# Patient Record
Sex: Male | Born: 1970 | Race: Black or African American | Hispanic: No | Marital: Single | Smoking: Current every day smoker
Health system: Southern US, Community
[De-identification: ages and names within clinical notes are randomized; demographics above are authoritative.]

## PROBLEM LIST (undated history)

## (undated) DIAGNOSIS — N319 Neuromuscular dysfunction of bladder, unspecified: Secondary | ICD-10-CM

## (undated) DIAGNOSIS — I429 Cardiomyopathy, unspecified: Secondary | ICD-10-CM

## (undated) DIAGNOSIS — I639 Cerebral infarction, unspecified: Secondary | ICD-10-CM

## (undated) DIAGNOSIS — E875 Hyperkalemia: Secondary | ICD-10-CM

## (undated) DIAGNOSIS — I5020 Unspecified systolic (congestive) heart failure: Secondary | ICD-10-CM

## (undated) DIAGNOSIS — Z720 Tobacco use: Secondary | ICD-10-CM

## (undated) DIAGNOSIS — D696 Thrombocytopenia, unspecified: Secondary | ICD-10-CM

## (undated) DIAGNOSIS — I10 Essential (primary) hypertension: Secondary | ICD-10-CM

## (undated) DIAGNOSIS — F1910 Other psychoactive substance abuse, uncomplicated: Secondary | ICD-10-CM

## (undated) DIAGNOSIS — Z992 Dependence on renal dialysis: Secondary | ICD-10-CM

## (undated) DIAGNOSIS — IMO0002 Reserved for concepts with insufficient information to code with codable children: Secondary | ICD-10-CM

## (undated) DIAGNOSIS — I272 Pulmonary hypertension, unspecified: Secondary | ICD-10-CM

## (undated) DIAGNOSIS — Z87442 Personal history of urinary calculi: Secondary | ICD-10-CM

## (undated) DIAGNOSIS — R0600 Dyspnea, unspecified: Secondary | ICD-10-CM

## (undated) DIAGNOSIS — Z993 Dependence on wheelchair: Secondary | ICD-10-CM

## (undated) DIAGNOSIS — D631 Anemia in chronic kidney disease: Secondary | ICD-10-CM

## (undated) DIAGNOSIS — N186 End stage renal disease: Secondary | ICD-10-CM

## (undated) DIAGNOSIS — Z905 Acquired absence of kidney: Secondary | ICD-10-CM

## (undated) DIAGNOSIS — E46 Unspecified protein-calorie malnutrition: Secondary | ICD-10-CM

## (undated) DIAGNOSIS — I729 Aneurysm of unspecified site: Secondary | ICD-10-CM

## (undated) DIAGNOSIS — Z9189 Other specified personal risk factors, not elsewhere classified: Secondary | ICD-10-CM

## (undated) DIAGNOSIS — R159 Full incontinence of feces: Secondary | ICD-10-CM

## (undated) DIAGNOSIS — N289 Disorder of kidney and ureter, unspecified: Secondary | ICD-10-CM

## (undated) DIAGNOSIS — D649 Anemia, unspecified: Secondary | ICD-10-CM

## (undated) DIAGNOSIS — M21379 Foot drop, unspecified foot: Secondary | ICD-10-CM

## (undated) DIAGNOSIS — N189 Chronic kidney disease, unspecified: Secondary | ICD-10-CM

## (undated) HISTORY — PX: NEPHRECTOMY: SHX65

## (undated) HISTORY — PX: CRANIOTOMY: SHX93

## (undated) HISTORY — PX: NEPHROSTOMY: SHX1014

---

## 1989-05-22 DIAGNOSIS — I671 Cerebral aneurysm, nonruptured: Secondary | ICD-10-CM

## 1989-05-22 HISTORY — DX: Cerebral aneurysm, nonruptured: I67.1

## 2011-11-05 ENCOUNTER — Emergency Department: Payer: Self-pay | Admitting: Emergency Medicine

## 2011-11-05 LAB — COMPREHENSIVE METABOLIC PANEL
Albumin: 2.6 g/dL — ABNORMAL LOW (ref 3.4–5.0)
Anion Gap: 12 (ref 7–16)
BUN: 69 mg/dL — ABNORMAL HIGH (ref 7–18)
Bilirubin,Total: 0.5 mg/dL (ref 0.2–1.0)
Calcium, Total: 8.7 mg/dL (ref 8.5–10.1)
Chloride: 107 mmol/L (ref 98–107)
Co2: 19 mmol/L — ABNORMAL LOW (ref 21–32)
Creatinine: 7.79 mg/dL — ABNORMAL HIGH (ref 0.60–1.30)
EGFR (African American): 9 — ABNORMAL LOW
EGFR (Non-African Amer.): 8 — ABNORMAL LOW
Potassium: 5.2 mmol/L — ABNORMAL HIGH (ref 3.5–5.1)
SGOT(AST): 17 U/L (ref 15–37)
SGPT (ALT): 13 U/L
Sodium: 138 mmol/L (ref 136–145)
Total Protein: 7.9 g/dL (ref 6.4–8.2)

## 2011-11-05 LAB — CBC
HCT: 37.1 % — ABNORMAL LOW (ref 40.0–52.0)
MCH: 30.4 pg (ref 26.0–34.0)
MCHC: 32.3 g/dL (ref 32.0–36.0)
MCV: 94 fL (ref 80–100)
RBC: 3.94 10*6/uL — ABNORMAL LOW (ref 4.40–5.90)
WBC: 14.7 10*3/uL — ABNORMAL HIGH (ref 3.8–10.6)

## 2011-11-05 LAB — URINALYSIS, COMPLETE
Blood: NEGATIVE
RBC,UR: 3 /HPF (ref 0–5)
WBC UR: 23 /HPF (ref 0–5)

## 2011-11-05 LAB — ETHANOL: Ethanol %: <0.003 % (ref 0.000–0.080)

## 2011-11-09 LAB — WOUND CULTURE

## 2011-11-10 LAB — CULTURE, BLOOD (SINGLE)

## 2011-11-13 LAB — URINE CULTURE

## 2012-04-10 ENCOUNTER — Emergency Department: Payer: Self-pay | Admitting: Emergency Medicine

## 2012-04-11 LAB — CBC WITH DIFFERENTIAL/PLATELET
Basophil #: 0.1 10*3/uL (ref 0.0–0.1)
Eosinophil #: 0.1 10*3/uL (ref 0.0–0.7)
Eosinophil %: 1.3 %
HCT: 40.1 % (ref 40.0–52.0)
HGB: 13.4 g/dL (ref 13.0–18.0)
Lymphocyte #: 1.7 10*3/uL (ref 1.0–3.6)
MCHC: 33.5 g/dL (ref 32.0–36.0)
MCV: 94 fL (ref 80–100)
Monocyte #: 0.7 x10 3/mm (ref 0.2–1.0)
Monocyte %: 7.2 %
Platelet: 189 10*3/uL (ref 150–440)

## 2012-04-11 LAB — COMPREHENSIVE METABOLIC PANEL
Albumin: 3.5 g/dL (ref 3.4–5.0)
Alkaline Phosphatase: 81 U/L (ref 50–136)
Anion Gap: 8 (ref 7–16)
BUN: 40 mg/dL — ABNORMAL HIGH (ref 7–18)
Bilirubin,Total: 0.6 mg/dL (ref 0.2–1.0)
Calcium, Total: 9 mg/dL (ref 8.5–10.1)
Chloride: 110 mmol/L — ABNORMAL HIGH (ref 98–107)
Creatinine: 3.79 mg/dL — ABNORMAL HIGH (ref 0.60–1.30)
Osmolality: 286 (ref 275–301)
Potassium: 5.1 mmol/L (ref 3.5–5.1)
SGOT(AST): 21 U/L (ref 15–37)
Sodium: 139 mmol/L (ref 136–145)
Total Protein: 9.2 g/dL — ABNORMAL HIGH (ref 6.4–8.2)

## 2012-04-11 LAB — URINALYSIS, COMPLETE
Glucose,UR: NEGATIVE mg/dL (ref 0–75)
Nitrite: POSITIVE
Ph: 8 (ref 4.5–8.0)
Protein: >=500
RBC,UR: 445 /HPF (ref 0–5)

## 2012-04-16 LAB — CULTURE, BLOOD (SINGLE)

## 2012-05-16 ENCOUNTER — Emergency Department: Payer: Self-pay | Admitting: Emergency Medicine

## 2012-07-17 ENCOUNTER — Emergency Department: Payer: Self-pay | Admitting: Unknown Physician Specialty

## 2012-07-17 LAB — URINALYSIS, COMPLETE
Glucose,UR: NEGATIVE mg/dL (ref 0–75)
Nitrite: NEGATIVE
Ph: 7 (ref 4.5–8.0)
Squamous Epithelial: NONE SEEN

## 2012-07-17 LAB — COMPREHENSIVE METABOLIC PANEL
Albumin: 3.1 g/dL — ABNORMAL LOW (ref 3.4–5.0)
Alkaline Phosphatase: 81 U/L (ref 50–136)
Anion Gap: 13 (ref 7–16)
BUN: 61 mg/dL — ABNORMAL HIGH (ref 7–18)
Bilirubin,Total: 0.7 mg/dL (ref 0.2–1.0)
Calcium, Total: 9 mg/dL (ref 8.5–10.1)
Chloride: 104 mmol/L (ref 98–107)
EGFR (African American): 11 — ABNORMAL LOW
EGFR (Non-African Amer.): 9 — ABNORMAL LOW
Osmolality: 292 (ref 275–301)
Potassium: 4.8 mmol/L (ref 3.5–5.1)
SGPT (ALT): 14 U/L (ref 12–78)
Sodium: 138 mmol/L (ref 136–145)

## 2012-07-17 LAB — CBC WITH DIFFERENTIAL/PLATELET
Basophil #: 0.1 10*3/uL (ref 0.0–0.1)
Basophil %: 0.4 %
Eosinophil #: 0 10*3/uL (ref 0.0–0.7)
Eosinophil %: 0.4 %
HCT: 39.4 % — ABNORMAL LOW (ref 40.0–52.0)
Lymphocyte %: 9.1 %
MCH: 30.7 pg (ref 26.0–34.0)
Monocyte #: 1.2 x10 3/mm — ABNORMAL HIGH (ref 0.2–1.0)
Neutrophil #: 10.4 10*3/uL — ABNORMAL HIGH (ref 1.4–6.5)
Platelet: 164 10*3/uL (ref 150–440)
RBC: 4.21 10*6/uL — ABNORMAL LOW (ref 4.40–5.90)
RDW: 13.8 % (ref 11.5–14.5)

## 2012-07-17 LAB — PROTIME-INR
INR: 1.2
Prothrombin Time: 15.5 secs — ABNORMAL HIGH (ref 11.5–14.7)

## 2012-07-17 LAB — TROPONIN I: Troponin-I: <0.02 ng/mL

## 2012-07-17 LAB — MAGNESIUM: Magnesium: 2.2 mg/dL

## 2012-07-20 LAB — URINE CULTURE

## 2012-07-22 LAB — CULTURE, BLOOD (SINGLE)

## 2012-09-04 ENCOUNTER — Emergency Department: Payer: Self-pay | Admitting: Emergency Medicine

## 2013-05-10 ENCOUNTER — Emergency Department: Payer: Self-pay | Admitting: Emergency Medicine

## 2013-07-05 ENCOUNTER — Emergency Department: Payer: Self-pay | Admitting: Emergency Medicine

## 2013-10-22 ENCOUNTER — Emergency Department: Payer: Self-pay | Admitting: Emergency Medicine

## 2013-10-22 LAB — CBC
HCT: 40.8 % (ref 40.0–52.0)
HGB: 13.5 g/dL (ref 13.0–18.0)
MCH: 31 pg (ref 26.0–34.0)
MCHC: 33.1 g/dL (ref 32.0–36.0)
MCV: 94 fL (ref 80–100)
Platelet: 261 10*3/uL (ref 150–440)
RBC: 4.35 10*6/uL — ABNORMAL LOW (ref 4.40–5.90)
RDW: 13.4 % (ref 11.5–14.5)
WBC: 6 10*3/uL (ref 3.8–10.6)

## 2013-10-22 LAB — URINALYSIS, COMPLETE
Bilirubin,UR: NEGATIVE
GLUCOSE, UR: NEGATIVE mg/dL (ref 0–75)
Ketone: NEGATIVE
Nitrite: NEGATIVE
Ph: 6 (ref 4.5–8.0)
Protein: 100
RBC,UR: 70 /HPF (ref 0–5)
SPECIFIC GRAVITY: 1.011 (ref 1.003–1.030)
Squamous Epithelial: 1
WBC UR: 1593 /HPF (ref 0–5)

## 2013-10-22 LAB — BASIC METABOLIC PANEL
Anion Gap: 6 — ABNORMAL LOW (ref 7–16)
BUN: 74 mg/dL — AB (ref 7–18)
CHLORIDE: 112 mmol/L — AB (ref 98–107)
CO2: 20 mmol/L — AB (ref 21–32)
Calcium, Total: 9.3 mg/dL (ref 8.5–10.1)
Creatinine: 5.22 mg/dL — ABNORMAL HIGH (ref 0.60–1.30)
EGFR (African American): 14 — ABNORMAL LOW
EGFR (Non-African Amer.): 12 — ABNORMAL LOW
Glucose: 68 mg/dL (ref 65–99)
OSMOLALITY: 296 (ref 275–301)
Potassium: 4.7 mmol/L (ref 3.5–5.1)
SODIUM: 138 mmol/L (ref 136–145)

## 2013-10-23 DIAGNOSIS — T83092A Other mechanical complication of nephrostomy catheter, initial encounter: Secondary | ICD-10-CM | POA: Insufficient documentation

## 2013-10-24 LAB — URINE CULTURE

## 2014-04-28 DIAGNOSIS — N186 End stage renal disease: Secondary | ICD-10-CM | POA: Insufficient documentation

## 2014-06-03 ENCOUNTER — Emergency Department: Payer: Self-pay | Admitting: Emergency Medicine

## 2014-08-29 ENCOUNTER — Emergency Department
Admit: 2014-08-29 | Disposition: A | Discharge status: Home / Self Care | Payer: Self-pay | Admitting: Emergency Medicine

## 2014-09-19 ENCOUNTER — Emergency Department
Admit: 2014-09-19 | Discharge: 2014-09-19 | Disposition: A | Discharge status: Home / Self Care | Payer: Self-pay | Admitting: Emergency Medicine

## 2014-09-19 LAB — URINALYSIS, COMPLETE
BILIRUBIN, UR: NEGATIVE
Glucose,UR: NEGATIVE mg/dL (ref 0–75)
KETONE: NEGATIVE
NITRITE: NEGATIVE
Ph: 7 (ref 4.5–8.0)
SPECIFIC GRAVITY: 1.011 (ref 1.003–1.030)
Squamous Epithelial: NONE SEEN

## 2014-10-02 ENCOUNTER — Emergency Department
Admission: EM | Admit: 2014-10-02 | Discharge: 2014-10-02 | Disposition: A | Discharge status: Other Acute Inpatient Hospital | Payer: Self-pay

## 2014-10-20 ENCOUNTER — Emergency Department
Admission: EM | Admit: 2014-10-20 | Discharge: 2014-10-20 | Discharge status: Against Medical Advice | Payer: Medicaid Other | Attending: Emergency Medicine | Admitting: Emergency Medicine

## 2014-10-20 ENCOUNTER — Encounter: Payer: Self-pay | Admitting: Emergency Medicine

## 2014-10-20 DIAGNOSIS — N186 End stage renal disease: Secondary | ICD-10-CM | POA: Diagnosis not present

## 2014-10-20 DIAGNOSIS — I120 Hypertensive chronic kidney disease with stage 5 chronic kidney disease or end stage renal disease: Secondary | ICD-10-CM | POA: Diagnosis not present

## 2014-10-20 DIAGNOSIS — T83038A Leakage of other indwelling urethral catheter, initial encounter: Secondary | ICD-10-CM | POA: Insufficient documentation

## 2014-10-20 DIAGNOSIS — Z992 Dependence on renal dialysis: Secondary | ICD-10-CM | POA: Insufficient documentation

## 2014-10-20 DIAGNOSIS — Y846 Urinary catheterization as the cause of abnormal reaction of the patient, or of later complication, without mention of misadventure at the time of the procedure: Secondary | ICD-10-CM | POA: Diagnosis not present

## 2014-10-20 DIAGNOSIS — T839XXA Unspecified complication of genitourinary prosthetic device, implant and graft, initial encounter: Secondary | ICD-10-CM

## 2014-10-20 DIAGNOSIS — Z720 Tobacco use: Secondary | ICD-10-CM | POA: Insufficient documentation

## 2014-10-20 HISTORY — DX: Essential (primary) hypertension: I10

## 2014-10-20 HISTORY — DX: Dependence on renal dialysis: Z99.2

## 2014-10-20 HISTORY — DX: Disorder of kidney and ureter, unspecified: N28.9

## 2014-10-20 LAB — URINALYSIS COMPLETE WITH MICROSCOPIC (ARMC ONLY)
Bilirubin Urine: NEGATIVE
Glucose, UA: NEGATIVE mg/dL
KETONES UR: NEGATIVE mg/dL
Nitrite: NEGATIVE
PH: 7 (ref 5.0–8.0)
PROTEIN: 100 mg/dL — AB
Specific Gravity, Urine: 1.011 (ref 1.005–1.030)
Squamous Epithelial / LPF: NONE SEEN

## 2014-10-20 NOTE — ED Notes (Signed)
Pt yelling from room, nurse into room to see what patient needs. Pt has disconnected leg bag from catheter and is allowing urine to drain all over the floor. Pt states that he needs a leg bag and needs to leave. Explained to patient that he has checked into the emergency department and will be evaluated by a medical provider first.

## 2014-10-20 NOTE — ED Provider Notes (Signed)
[facility] Emergency Department Provider Note  ____________________________________________  Time seen: Approximately 7:40 PM  I have reviewed the triage vital signs and the nursing notes.   HISTORY  Chief Complaint Medical supply    HPI Larry Morrison is a 44 y.o. male presents to the ER for the complaint of needs a Foley leg bag replacement. Patient states that the leg bag is leaking at the bottom of the bag. States he has had this before and he just needs a bag replacement. Denies pain. Denies abdominal pain, chest pain, shortness of breath, fever, vomiting, nausea, diarrhea. Denies dysuria, penile discharge, penile swelling. Denies change in urine flow. Denies abdominal pressure. States "urine is draining fine" but thinks "that bag has a hole in it."  Patient states that he has an appointment with [facility] tomorrow morning for follow-up. 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  Patient states he just needs the leg bag to be changed. denies complaints.  Patient standing and hallway saying that he is ready to go prior to being seen by provider. Patient states that he does not care and wants to leave.   Past Medical History  Diagnosis Date  . Renal disorder   . Hypertension   . 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   Past Surgical History  Procedure Laterality Date  . Nephrectomy    left  No current outpatient prescriptions on file.  Allergies Review of patient's  allergies indicates not on file.  No family history on file.  Social History History  Substance Use Topics  . Smoking status: Smoker, Current Status Unknown  . Smokeless tobacco: Not on file  . Alcohol Use: Not on file    Review of Systems Constitutional: No fever/chills Eyes: No visual changes. ENT: No sore throat. Cardiovascular: Denies chest pain. Respiratory: Denies shortness of breath. Gastrointestinal: No abdominal pain.  No nausea, no vomiting.  No diarrhea.  No constipation. Genitourinary: Negative for dysuria. Musculoskeletal: Negative for back pain. Skin: Negative for rash. Neurological: Negative for headaches, focal weakness or numbness.  10-point ROS otherwise negative.  ____________________________________________   PHYSICAL EXAM:  VITAL SIGNS: ED Triage Vitals  Enc Vitals Group     BP 10/20/14 1850 141/112 mmHg     Pulse Rate 10/20/14 1850 89     Resp 10/20/14 1850 18     Temp 10/20/14 1850 97.8 F (36.6 C)     Temp Source 10/20/14 1850 Oral     SpO2 10/20/14 1850 98 %     Weight 10/20/14 1850 120 lb (54.432 kg)     Height 10/20/14 1850 5\' 5"  (1.651 m)     Head Cir --      Peak Flow --      Pain Score 10/20/14 1852 0     Pain Loc --      Pain Edu? --      Excl. in [place]? --    Patient refused much  of exam. Patient refuses genitourinary exam.  Constitutional: Alert and oriented. Well appearing and in no acute distress. Head: Atraumatic. Nose: No congestion/rhinnorhea. Cardiovascular: Normal rate, regular rhythm. Grossly normal heart sounds. Respiratory: Normal respiratory effort.  No retractions. Lungs CTAB. Gastrointestinal: Soft and nontender. No distention.No CVA tenderness. Right nephrostomy tube present. Nontender, no surrounding erythema.  Neurologic:  Normal speech and language. No gross focal neurologic deficits are appreciated. Speech is normal. Psychiatric: Mood and affect are normal. Speech and behavior are  normal.  ____________________________________________   LABS (all labs ordered are listed, but only abnormal results are displayed) Urinalysis ordered: pt left ER AMA prior to result. ______________________________________   INITIAL IMPRESSION / ASSESSMENT AND PLAN / ED COURSE  Pertinent labs & imaging results that were available during my care of the patient were reviewed by me and considered in my medical decision making (see chart for details).  Patient ambulating in hallway reporting that he wants to leave prior to examination.  Patient refused much of exam. Patient reporting that he only wants a leg bag as his bag is leaking. Patient denies changes in flow of urine and denies pain. States he has a [facility] follow-up tomorrow morning.  Urine obtained by RN when changing urine bag. Per RN urine appeared to be cloudy. Urinalysis ordered. Patient ambulatory in hallway again stating that he wants to leave. Discussed with patient risk of leaving prior to full exam and evaluation of the urine, even including death. Patient verbalized understanding and states that he is still going to be leaving. Patient alert and oriented with decisional capacity. Patient states he will not wait to sign AMA form. Patient left the ER at [facility] amb[facility]. ____________________________________________   FINAL CLINICAL IMPRESSION(S) / ED DIAGNOSES  Final diagnoses:  Problem with Urinary catheter bag, initial encounter  Urinary leg bag change    Marylene Land, NP 10/20/14 2012  Lisa Roca, MD 10/20/14 2015

## 2014-10-20 NOTE — ED Notes (Signed)
Patient states he is only here for replacement foley leg bag. States small area is leaking at bottom of bag. Patient took EMS to ER.

## 2014-10-20 NOTE — ED Notes (Signed)
Leg Bag applied. I informed pt that we need to run a Urinalysis. Pt asked how long it would take, I informed pt it would take 45 minutes to 1 hour. Pt stated "I ain't gonna wait that long, my ride needs to come get me". Pt proceeded to call ride while I was in the room collecting the urine on the computer.

## 2015-01-03 ENCOUNTER — Emergency Department
Admission: EM | Admit: 2015-01-03 | Discharge: 2015-01-03 | Disposition: A | Discharge status: Home / Self Care | Payer: Medicaid Other | Attending: Emergency Medicine | Admitting: Emergency Medicine

## 2015-01-03 ENCOUNTER — Encounter: Payer: Self-pay | Admitting: Emergency Medicine

## 2015-01-03 ENCOUNTER — Other Ambulatory Visit: Payer: Self-pay

## 2015-01-03 ENCOUNTER — Emergency Department: Payer: Medicaid Other

## 2015-01-03 DIAGNOSIS — R0789 Other chest pain: Secondary | ICD-10-CM | POA: Insufficient documentation

## 2015-01-03 DIAGNOSIS — I10 Essential (primary) hypertension: Secondary | ICD-10-CM | POA: Diagnosis not present

## 2015-01-03 DIAGNOSIS — R079 Chest pain, unspecified: Secondary | ICD-10-CM | POA: Diagnosis present

## 2015-01-03 DIAGNOSIS — Z720 Tobacco use: Secondary | ICD-10-CM | POA: Diagnosis not present

## 2015-01-03 HISTORY — DX: Cerebral infarction, unspecified: I63.9

## 2015-01-03 HISTORY — DX: Aneurysm of unspecified site: I72.9

## 2015-01-03 HISTORY — DX: Foot drop, unspecified foot: M21.379

## 2015-01-03 LAB — BASIC METABOLIC PANEL
Anion gap: 8 (ref 5–15)
BUN: 31 mg/dL — ABNORMAL HIGH (ref 6–20)
CALCIUM: 8.9 mg/dL (ref 8.9–10.3)
CO2: 27 mmol/L (ref 22–32)
Chloride: 106 mmol/L (ref 101–111)
Creatinine, Ser: 3.82 mg/dL — ABNORMAL HIGH (ref 0.61–1.24)
GFR calc non Af Amer: 18 mL/min — ABNORMAL LOW (ref >60)
GFR, EST AFRICAN AMERICAN: 20 mL/min — AB (ref >60)
GLUCOSE: 93 mg/dL (ref 65–99)
Potassium: 4.5 mmol/L (ref 3.5–5.1)
Sodium: 141 mmol/L (ref 135–145)

## 2015-01-03 LAB — CBC
HCT: 37.5 % — ABNORMAL LOW (ref 40.0–52.0)
Hemoglobin: 12.2 g/dL — ABNORMAL LOW (ref 13.0–18.0)
MCH: 30 pg (ref 26.0–34.0)
MCHC: 32.6 g/dL (ref 32.0–36.0)
MCV: 92 fL (ref 80.0–100.0)
Platelets: 219 10*3/uL (ref 150–440)
RBC: 4.07 MIL/uL — AB (ref 4.40–5.90)
RDW: 13.4 % (ref 11.5–14.5)
WBC: 6.7 10*3/uL (ref 3.8–10.6)

## 2015-01-03 LAB — TROPONIN I
Troponin I: <0.03 ng/mL (ref <0.031)
Troponin I: <0.03 ng/mL (ref <0.031)

## 2015-01-03 MED ORDER — HYDROCODONE-ACETAMINOPHEN 5-325 MG PO TABS: 1.0000 | ORAL_TABLET | Freq: Four times a day (QID) | ORAL | Status: DC | PRN

## 2015-01-03 MED ORDER — HYDROCODONE-ACETAMINOPHEN 5-325 MG PO TABS
1.0000 | ORAL_TABLET | Freq: Once | ORAL | Status: AC
  Administered 2015-01-03: 1 via ORAL
  Filled 2015-01-03: qty 1
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
## 2015-02-14 ENCOUNTER — Emergency Department
Admission: EM | Admit: 2015-02-14 | Discharge: 2015-02-14 | Disposition: A | Discharge status: Home / Self Care | Payer: Medicaid Other | Attending: Student | Admitting: Student

## 2015-02-14 ENCOUNTER — Encounter: Payer: Self-pay | Admitting: Emergency Medicine

## 2015-02-14 DIAGNOSIS — I120 Hypertensive chronic kidney disease with stage 5 chronic kidney disease or end stage renal disease: Secondary | ICD-10-CM | POA: Insufficient documentation

## 2015-02-14 DIAGNOSIS — Z992 Dependence on renal dialysis: Secondary | ICD-10-CM | POA: Diagnosis not present

## 2015-02-14 DIAGNOSIS — R8299 Other abnormal findings in urine: Secondary | ICD-10-CM | POA: Insufficient documentation

## 2015-02-14 DIAGNOSIS — R8290 Unspecified abnormal findings in urine: Secondary | ICD-10-CM

## 2015-02-14 DIAGNOSIS — N186 End stage renal disease: Secondary | ICD-10-CM | POA: Insufficient documentation

## 2015-02-14 DIAGNOSIS — R109 Unspecified abdominal pain: Secondary | ICD-10-CM | POA: Diagnosis present

## 2015-02-14 LAB — CBC WITH DIFFERENTIAL/PLATELET
Basophils Absolute: 0.1 10*3/uL (ref 0–0.1)
Basophils Relative: 1 %
Eosinophils Absolute: 0.3 10*3/uL (ref 0–0.7)
Eosinophils Relative: 7 %
HEMATOCRIT: 38.3 % — AB (ref 40.0–52.0)
Hemoglobin: 12.3 g/dL — ABNORMAL LOW (ref 13.0–18.0)
LYMPHS PCT: 30 %
Lymphs Abs: 1.5 10*3/uL (ref 1.0–3.6)
MCH: 29.8 pg (ref 26.0–34.0)
MCHC: 32.1 g/dL (ref 32.0–36.0)
MCV: 92.6 fL (ref 80.0–100.0)
Monocytes Absolute: 0.3 10*3/uL (ref 0.2–1.0)
Monocytes Relative: 6 %
NEUTROS PCT: 56 %
Neutro Abs: 2.9 10*3/uL (ref 1.4–6.5)
PLATELETS: 193 10*3/uL (ref 150–440)
RBC: 4.13 MIL/uL — AB (ref 4.40–5.90)
RDW: 13.6 % (ref 11.5–14.5)
WBC: 5 10*3/uL (ref 3.8–10.6)

## 2015-02-14 LAB — URINALYSIS COMPLETE WITH MICROSCOPIC (ARMC ONLY)
BILIRUBIN URINE: NEGATIVE
Bacteria, UA: NONE SEEN
Glucose, UA: NEGATIVE mg/dL
Ketones, ur: NEGATIVE mg/dL
Nitrite: NEGATIVE
PH: 7 (ref 5.0–8.0)
Protein, ur: 100 mg/dL — AB
SQUAMOUS EPITHELIAL / LPF: NONE SEEN
Specific Gravity, Urine: 1.012 (ref 1.005–1.030)

## 2015-02-14 LAB — BASIC METABOLIC PANEL
ANION GAP: 7 (ref 5–15)
BUN: 33 mg/dL — ABNORMAL HIGH (ref 6–20)
CO2: 23 mmol/L (ref 22–32)
Calcium: 8.3 mg/dL — ABNORMAL LOW (ref 8.9–10.3)
Chloride: 109 mmol/L (ref 101–111)
Creatinine, Ser: 3.69 mg/dL — ABNORMAL HIGH (ref 0.61–1.24)
GFR calc Af Amer: 21 mL/min — ABNORMAL LOW (ref >60)
GFR, EST NON AFRICAN AMERICAN: 18 mL/min — AB (ref >60)
Glucose, Bld: 83 mg/dL (ref 65–99)
POTASSIUM: 4.6 mmol/L (ref 3.5–5.1)
Sodium: 139 mmol/L (ref 135–145)

## 2015-02-14 NOTE — ED Notes (Signed)
Pt arrived via EMS with complaints of foul odor noted from nephrostomy tube drainage bag. Pt reports right flank pain.

## 2015-02-14 NOTE — ED Provider Notes (Signed)
[facility] Emergency Department Provider Note  ____________________________________________  Time seen: Approximately 10:19 AM  I have reviewed the triage vital signs and the nursing notes.   HISTORY  Chief Complaint Flank Pain    HPI Larry Morrison is a 44 y.o. 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 He is concerned that he might have a urinary tract infection. Contrary to the triage note, he adamantly denies that he is having any flank pain or any pain in general. He denies any abdominal pain, no nausea, vomiting, diarrhea, fever or chills. He is solely concerned about the smell of his urine. Severity of symptoms is mild. No modifying factors. No chest pain or difficulty breathing.  Past Medical History  Diagnosis Date  . Renal disorder   . Hypertension   . Dialysis patient   . Aneurysm   . Foot drop   . Stroke     There are no active problems to display for this patient.   Past Surgical History  Procedure Laterality Date  . Nephrectomy    . Nephrectomy      Current Outpatient Rx  Name  Route  Sig  Dispense  Refill  . HYDROcodone-acetaminophen (NORCO/VICODIN) 5-325 MG per tablet   Oral   Take 1 tablet by mouth every 6 (six) hours as needed for moderate pain.   15 tablet   0     Allergies Review of patient's allergies indicates no known allergies.  No family history on file.  Social History Social History  Substance Use Topics  . Smoking status: Smoker, Current Status Unknown    Types: Cigarettes  . Smokeless tobacco: Never Used  . Alcohol Use: No    Review of Systems Constitutional: No fever/chills Eyes: No visual changes. ENT: No sore throat. Cardiovascular: Denies chest pain. Respiratory: Denies shortness of breath. Gastrointestinal: No abdominal pain.  No  nausea, no vomiting.  No diarrhea.  No constipation. Genitourinary: Negative for dysuria. Musculoskeletal: Negative for back pain. Skin: Negative for rash. Neurological: Negative for headaches, focal weakness or numbness.  10-point ROS otherwise negative.  ____________________________________________   PHYSICAL EXAM:  VITAL SIGNS: ED Triage Vitals  Enc Vitals Group     BP 02/14/15 0905 124/82 mmHg     Pulse Rate 02/14/15 0905 67     Resp --      Temp 02/14/15 0905 97.7 F (36.5 C)     Temp Source 02/14/15 0905 Oral     SpO2 02/14/15 0905 100 %     Weight 02/14/15 0905 110 lb (49.896 kg)     Height 02/14/15 0905 5\' 4"  (1.626 m)     Head Cir --      Peak Flow --      Pain Score 02/14/15 0907 5     Pain Loc --      Pain Edu? --      Excl. in [place]? --     Constitutional: Alert and oriented. Well appearing and in no acute distress. Eyes: Conjunctivae are normal. PERRL. EOMI. Head: Atraumatic. Nose: No congestion/rhinnorhea. Mouth/Throat: Mucous membranes are moist.  Oropharynx non-erythematous. Neck: No stridor.  Cardiovascular: Normal rate, regular rhythm. Grossly normal heart sounds.  Good peripheral circulation. Respiratory: Normal respiratory effort.  No retractions. Lungs CTAB. Gastrointestinal: Soft and nontender. No distention. nephrostomy tube exiting the right flank with no stranding erythema, no drainage, no fluctuance.  No CVA tenderness. Genitourinary: deferred Musculoskeletal: No lower extremity tenderness nor edema.  No joint effusions. Neurologic:  Normal speech and language. No gross focal neurologic deficits are appreciated.  Skin:  Skin is warm, dry and intact. No rash noted. Psychiatric: Mood and affect are normal. Speech and behavior are normal.  ____________________________________________   LABS (all labs ordered are listed, but only abnormal results are displayed)  Labs Reviewed  CBC WITH DIFFERENTIAL/PLATELET - Abnormal; Notable for the following:     RBC 4.13 (*)    Hemoglobin 12.3 (*)    HCT 38.3 (*)    All other components within normal limits  BASIC METABOLIC PANEL - Abnormal; Notable for the following:    BUN 33 (*)    Creatinine, Ser 3.69 (*)    Calcium 8.3 (*)    GFR calc non Af Amer 18 (*)    GFR calc Af Amer 21 (*)    All other components within normal limits  URINALYSIS COMPLETEWITH MICROSCOPIC ([facility] ONLY) - Abnormal; Notable for the following:    Color, Urine YELLOW (*)    APPearance CLOUDY (*)    Hgb urine dipstick 3+ (*)    Protein, ur 100 (*)    Leukocytes, UA 3+ (*)    All other components within normal limits  URINE CULTURE  URINE CULTURE   ____________________________________________  EKG  none ____________________________________________  RADIOLOGY  none ____________________________________________   PROCEDURES  Procedure(s) performed: None  Critical Care performed: No  ____________________________________________   INITIAL IMPRESSION / ASSESSMENT AND PLAN / ED COURSE  Pertinent labs & imaging results that were available during my care of the patient were reviewed by me and considered in my medical decision making (see chart for details).  Larry Morrison is a 44 y.o. male with history of chronic kidney disease, one functioning kidney with chronic nephrostomy tube in place who presents for evaluation of foul smelling urine. On exam, he is generally well-appearing and in no acute distress. Vital signs stable, he is afebrile. He is a well placed chronic indwelling nephrostomy tube which is draining cloudy yellow urine. Labs are notable for mild anemia. Creatinine is elevated at 3.69 which is expected in the setting of his chronic kidney disease. No leukocytosis. Urinalysis with multiple red blood cells, white blood cells, no bacteria. Findings of his urinalysis appear chronic when compared to findings 3 months ago and given his general well-appearance and his only complaint today being a foul smell  to his urine, will send cultures and wait to treat. He reports that in the past when he has had foul-smelling urine, change of the leg bag has been helpful so we'll change his leg bag. Discussed return precautions, need for close PCP follow-up, he is comfortable with discharge plan. ____________________________________________   FINAL CLINICAL IMPRESSION(S) / ED DIAGNOSES  Final diagnoses:  Urine abnormality      Joanne Gavel, MD 02/14/15 1515

## 2015-02-14 NOTE — ED Notes (Signed)
New leg bag applied per request of pt.

## 2015-02-15 LAB — URINE CULTURE: Special Requests: NORMAL

## 2015-04-04 ENCOUNTER — Emergency Department: Payer: Medicaid Other

## 2015-04-04 ENCOUNTER — Emergency Department
Admission: EM | Admit: 2015-04-04 | Discharge: 2015-04-04 | Disposition: A | Discharge status: Home / Self Care | Payer: Medicaid Other | Attending: Emergency Medicine | Admitting: Emergency Medicine

## 2015-04-04 ENCOUNTER — Encounter: Payer: Self-pay | Admitting: Emergency Medicine

## 2015-04-04 DIAGNOSIS — R109 Unspecified abdominal pain: Secondary | ICD-10-CM | POA: Diagnosis present

## 2015-04-04 DIAGNOSIS — F17210 Nicotine dependence, cigarettes, uncomplicated: Secondary | ICD-10-CM | POA: Diagnosis not present

## 2015-04-04 DIAGNOSIS — N390 Urinary tract infection, site not specified: Secondary | ICD-10-CM | POA: Insufficient documentation

## 2015-04-04 DIAGNOSIS — I10 Essential (primary) hypertension: Secondary | ICD-10-CM | POA: Insufficient documentation

## 2015-04-04 LAB — CBC WITH DIFFERENTIAL/PLATELET
Basophils Absolute: 0.1 10*3/uL (ref 0–0.1)
Basophils Relative: 1 %
EOS ABS: 0.6 10*3/uL (ref 0–0.7)
Eosinophils Relative: 8 %
HCT: 39.1 % — ABNORMAL LOW (ref 40.0–52.0)
HEMOGLOBIN: 12.8 g/dL — AB (ref 13.0–18.0)
Lymphocytes Relative: 44 %
Lymphs Abs: 3.3 10*3/uL (ref 1.0–3.6)
MCH: 30.4 pg (ref 26.0–34.0)
MCHC: 32.7 g/dL (ref 32.0–36.0)
MCV: 92.9 fL (ref 80.0–100.0)
Monocytes Absolute: 0.7 10*3/uL (ref 0.2–1.0)
Monocytes Relative: 9 %
NEUTROS PCT: 38 %
Neutro Abs: 2.9 10*3/uL (ref 1.4–6.5)
Platelets: 231 10*3/uL (ref 150–440)
RBC: 4.21 MIL/uL — AB (ref 4.40–5.90)
RDW: 13.2 % (ref 11.5–14.5)
WBC: 7.5 10*3/uL (ref 3.8–10.6)

## 2015-04-04 LAB — URINALYSIS COMPLETE WITH MICROSCOPIC (ARMC ONLY)
Bilirubin Urine: NEGATIVE
GLUCOSE, UA: NEGATIVE mg/dL
Ketones, ur: NEGATIVE mg/dL
Nitrite: NEGATIVE
PH: 7 (ref 5.0–8.0)
PROTEIN: 100 mg/dL — AB
SQUAMOUS EPITHELIAL / LPF: NONE SEEN
Specific Gravity, Urine: 1.014 (ref 1.005–1.030)

## 2015-04-04 LAB — COMPREHENSIVE METABOLIC PANEL
ALT: 20 U/L (ref 17–63)
AST: 24 U/L (ref 15–41)
Albumin: 3.5 g/dL (ref 3.5–5.0)
Alkaline Phosphatase: 59 U/L (ref 38–126)
Anion gap: 5 (ref 5–15)
BUN: 49 mg/dL — ABNORMAL HIGH (ref 6–20)
CHLORIDE: 106 mmol/L (ref 101–111)
CO2: 23 mmol/L (ref 22–32)
Calcium: 8.5 mg/dL — ABNORMAL LOW (ref 8.9–10.3)
Creatinine, Ser: 3.53 mg/dL — ABNORMAL HIGH (ref 0.61–1.24)
GFR, EST AFRICAN AMERICAN: 22 mL/min — AB (ref >60)
GFR, EST NON AFRICAN AMERICAN: 19 mL/min — AB (ref >60)
Glucose, Bld: 91 mg/dL (ref 65–99)
Potassium: 4.7 mmol/L (ref 3.5–5.1)
Sodium: 134 mmol/L — ABNORMAL LOW (ref 135–145)
Total Bilirubin: 0.4 mg/dL (ref 0.3–1.2)
Total Protein: 7.9 g/dL (ref 6.5–8.1)

## 2015-04-04 LAB — LIPASE, BLOOD: LIPASE: 54 U/L — AB (ref 11–51)

## 2015-04-04 MED ORDER — SODIUM CHLORIDE 0.9 % IV BOLUS (SEPSIS)
500.0000 mL | Freq: Once | INTRAVENOUS | Status: AC
  Administered 2015-04-04: 500 mL via INTRAVENOUS

## 2015-04-04 MED ORDER — OXYCODONE-ACETAMINOPHEN 5-325 MG PO TABS: 1.0000 | ORAL_TABLET | Freq: Four times a day (QID) | ORAL | Status: DC | PRN

## 2015-04-04 MED ORDER — DEXTROSE 5 % IV SOLN
INTRAVENOUS | Status: AC
  Filled 2015-04-04: qty 10

## 2015-04-04 MED ORDER — DEXTROSE 5 % IV SOLN
1.0000 g | Freq: Once | INTRAVENOUS | Status: AC
  Administered 2015-04-04: 1 g via INTRAVENOUS
  Filled 2015-04-04: qty 10

## 2015-04-04 MED ORDER — CEPHALEXIN 500 MG PO CAPS: 500.0000 mg | ORAL_CAPSULE | Freq: Four times a day (QID) | ORAL | Status: AC

## 2015-04-04 NOTE — ED Notes (Signed)
Pt with right sided "stent into my kidney". Pt states "my bag broke and i have a pain there, it stinks i think my pee is infected." pt appears in no acute distress. Pt states has had "stent" for 24 years. Cloudy yellow urine present from drainage catheter.

## 2015-04-04 NOTE — ED Notes (Addendum)
EMS pt to registration desk. Via wheelchair. Pt reports something broke on his ostomy bag and he does not have the supplies at home to replace it. EMS VS P-72, R- 18 and BP- 120/74.

## 2015-04-04 NOTE — ED Provider Notes (Signed)
[facility] Emergency Department Provider Note  ____________________________________________   I have reviewed the triage vital signs and the nursing notes.   HISTORY  Chief Complaint Flank Pain    HPI Larry Morrison is a 44 y.o. male  with a history of CVA, and nephrectomy remotely with a urostomy tube for 20 years he states. He states that he needs a new bag for his urostomy tube because it broke. He also states that the urine smells "a little funny" and he is worried that he might have a urinary tract infection. He has a "little bit" of pain in his right flank. No fever no chills no nausea no vomiting no abdominal pain no diarrhea no headache. Baseline creatinines proximal by 3.5.     Past Medical History  Diagnosis Date  . Renal disorder   . Hypertension   . Aneurysm ([place])   . Foot drop   . Stroke [facility])     There are no active problems to display for this patient.   Past Surgical History  Procedure Laterality Date  . Nephrectomy    . Nephrectomy      Current Outpatient Rx  Name  Route  Sig  Dispense  Refill  . HYDROcodone-acetaminophen (NORCO/VICODIN) 5-325 MG per tablet   Oral   Take 1 tablet by mouth every 6 (six) hours as needed for moderate pain. Patient not taking: Reported on 04/04/2015   15 tablet   0     Allergies Review of patient's allergies indicates no known allergies.  History reviewed. No pertinent family history.  Social History Social History  Substance Use Topics  . Smoking status: Current Some Day Smoker    Types: Cigarettes  . Smokeless tobacco: Never Used  . Alcohol Use: No    Review of Systems Constitutional: No fever/chills Eyes: No visual changes. ENT: No sore throat. No stiff neck no neck pain Cardiovascular: Denies chest pain. Respiratory: Denies shortness of breath. Gastrointestinal:   no vomiting.  No diarrhea.  No constipation. Genitourinary: Negative for dysuria. See history of present  illness Musculoskeletal: Negative lower extremity swelling Skin: Negative for rash. Neurological: Negative for headaches, focal weakness or numbness. 10-point ROS otherwise negative.  ____________________________________________   PHYSICAL EXAM:  VITAL SIGNS: ED Triage Vitals  Enc Vitals Group     BP 04/04/15 2106 144/75 mmHg     Pulse Rate 04/04/15 2106 89     Resp 04/04/15 2106 18     Temp 04/04/15 2106 98.1 F (36.7 C)     Temp Source 04/04/15 2106 Oral     SpO2 04/04/15 2106 96 %     Weight 04/04/15 2106 103 lb (46.72 kg)     Height 04/04/15 2106 5\' 2"  (1.575 m)     Head Cir --      Peak Flow --      Pain Score 04/04/15 2110 3     Pain Loc --      Pain Edu? --      Excl. in [place]? --     Constitutional: Alert and oriented. Well appearing and in no acute distress. Eyes: Conjunctivae are normal. PERRL. EOMI. Head: Atraumatic. Nose: No congestion/rhinnorhea. Mouth/Throat: Mucous membranes are moist.  Oropharynx non-erythematous. Neck: No stridor.   Nontender with no meningismus Cardiovascular: Normal rate, regular rhythm. Grossly normal heart sounds.  Good peripheral circulation. Respiratory: Normal respiratory effort.  No retractions. Lungs CTAB. Abdominal: Soft and nontender. No distention. No guarding no rebound Back:  There is no focal  tenderness or step off there is no midline tenderness there are no lesions noted. there is no CVA tenderness Urostomy site on the right flank is normal appearance with no evidence of erythema or purulent drainage Musculoskeletal: No lower extremity tenderness. No joint effusions, no DVT signs strong distal pulses no edema Neurologic:  Normal speech and language.  Skin:  Skin is warm, dry and intact. No rash noted. Psychiatric: Mood and affect are normal. Speech and behavior are normal.  ____________________________________________   LABS (all labs ordered are listed, but only abnormal results are displayed)  Labs Reviewed   URINALYSIS COMPLETEWITH MICROSCOPIC ([place] ONLY) - Abnormal; Notable for the following:    Color, Urine YELLOW (*)    APPearance TURBID (*)    Hgb urine dipstick 3+ (*)    Protein, ur 100 (*)    Leukocytes, UA 3+ (*)    Bacteria, UA MANY (*)    All other components within normal limits  COMPREHENSIVE METABOLIC PANEL - Abnormal; Notable for the following:    Sodium 134 (*)    BUN 49 (*)    Creatinine, Ser 3.53 (*)    Calcium 8.5 (*)    GFR calc non Af Amer 19 (*)    GFR calc Af Amer 22 (*)    All other components within normal limits  CBC WITH DIFFERENTIAL/PLATELET - Abnormal; Notable for the following:    RBC 4.21 (*)    Hemoglobin 12.8 (*)    HCT 39.1 (*)    All other components within normal limits  LIPASE, BLOOD - Abnormal; Notable for the following:    Lipase 54 (*)    All other components within normal limits  URINE CULTURE   ____________________________________________  EKG  I personally interpreted any EKGs ordered by me or triage  ____________________________________________  RADIOLOGY  I reviewed any imaging ordered by me or triage that were performed during my shift ____________________________________________   PROCEDURES  Procedure(s) performed: None  Critical Care performed: None  ____________________________________________   INITIAL IMPRESSION / ASSESSMENT AND PLAN / ED COURSE  Pertinent labs & imaging results that were available during my care of the patient were reviewed by me and considered in my medical decision making (see chart for details).  Very well-appearing gentleman for his baseline, creatinine is at baseline no elevated white count, he has been here multiple times for possible urinary tract infections, his urine is: Eyes so it is difficult to know that there is no evidence of stranding or infection around the kidney itself. CT is reassuring. I'll offer the patient admission but he would prefer to go home. We will give him IV  antibiotics here and start him on Keflex pending cultures. There is no significant flank pain. Patient is asking for her way home. We will see if we can help him with that. ____________________________________________   FINAL CLINICAL IMPRESSION(S) / ED DIAGNOSES  Final diagnoses:  Flank pain     Schuyler Amor, MD 04/04/15 2253

## 2015-04-04 NOTE — ED Notes (Signed)
Pt brought in via ems from home with right flank pain.  Pt has a stent connected to leg bag draining cloudy yellow urine  Pt reports pain around tube in right flank.  Sx began this am.  Pt reports tube to be changed at [facility] this week.  No n/v/d. Pt alert.

## 2015-04-04 NOTE — ED Notes (Addendum)
Pt uprite on stretcher in exam room with no distress noted; reviewed d/c plan with pt; pt st he needs a ride home; pt informed that we are not responsible for transportation home; pt st "they always give me a taxi home; ya'll need to give a taxi or a voucher or something!"; pt pulling IV out despite nurse attempting to assist--cannula intact and dressing applied by this nurse; pt informed that I have spoken with the charge nurse who is also in agreeance that he needs to attempt to find transport home; pt demanding to speak with charge nurse; charge nurse (Mali) notified; pt up in room dressing self; steady gait noted

## 2015-04-04 NOTE — Discharge Instructions (Signed)
You do not wish to be admitted to the hospital which is not unreasonable but if you feel worse including increased pain, vomiting, fever or any other new or worsening symptoms please return to the emergency department.

## 2015-04-04 NOTE — ED Notes (Signed)
Iv fluids infusing.  Pt alert and watching tv.

## 2015-04-05 NOTE — ED Notes (Addendum)
Pt taken to lobby via w/c and first nurse notified (A Cales, RN) of pt currently making phone calls for transport home

## 2015-04-06 LAB — URINE CULTURE

## 2015-08-27 ENCOUNTER — Emergency Department
Admission: EM | Admit: 2015-08-27 | Discharge: 2015-08-27 | Disposition: A | Discharge status: Home / Self Care | Payer: Medicaid Other | Attending: Emergency Medicine | Admitting: Emergency Medicine

## 2015-08-27 DIAGNOSIS — Z8673 Personal history of transient ischemic attack (TIA), and cerebral infarction without residual deficits: Secondary | ICD-10-CM | POA: Diagnosis not present

## 2015-08-27 DIAGNOSIS — I10 Essential (primary) hypertension: Secondary | ICD-10-CM | POA: Insufficient documentation

## 2015-08-27 DIAGNOSIS — Z79899 Other long term (current) drug therapy: Secondary | ICD-10-CM | POA: Insufficient documentation

## 2015-08-27 DIAGNOSIS — T83018A Breakdown (mechanical) of other indwelling urethral catheter, initial encounter: Secondary | ICD-10-CM | POA: Insufficient documentation

## 2015-08-27 DIAGNOSIS — Y829 Unspecified medical devices associated with adverse incidents: Secondary | ICD-10-CM | POA: Insufficient documentation

## 2015-08-27 DIAGNOSIS — F1290 Cannabis use, unspecified, uncomplicated: Secondary | ICD-10-CM | POA: Diagnosis not present

## 2015-08-27 DIAGNOSIS — R109 Unspecified abdominal pain: Secondary | ICD-10-CM | POA: Diagnosis present

## 2015-08-27 DIAGNOSIS — Z436 Encounter for attention to other artificial openings of urinary tract: Secondary | ICD-10-CM

## 2015-08-27 DIAGNOSIS — F17210 Nicotine dependence, cigarettes, uncomplicated: Secondary | ICD-10-CM | POA: Insufficient documentation

## 2015-08-27 NOTE — ED Notes (Signed)
New urostomy bag placed.

## 2015-08-27 NOTE — Discharge Instructions (Signed)
Urostomy Home Guide A urostomy is an opening for urine to leave your body when you have had your bladder removed. During a surgery, the tubes that drain urine from your kidneys are connected to a piece of intestine. This piece of intestine is then brought through a hole in the abdominal wall. This new opening is called a stoma or ostomy. A bag or pouch fits over the stoma to catch your urine. CARING FOR YOUR STOMA Normally, the stoma looks a lot like the inside of your cheek: pink and moist. At first, it may be swollen, but this swelling will decrease within 6 weeks. Keep the skin around the stoma clean and dry. You can gently wash your stoma and the skin around your stoma in the shower with a clean, soft washcloth. If you develop any skin irritation, your caregiver may give you a stoma powder or ointment to help heal the area. Do not use any products other than those specifically given to you by your caregiver.  Your stoma should not be uncomfortable. If you notice any stinging or burning, your pouch may be leaking, and the skin around your stoma may be coming into contact with urine. This can cause skin irritation. If you notice stinging or leaking of any amount, replace your pouch with a new one and discard the old one. UROSTOMY POUCHES The pouch that fits over the urostomy can be made up of either 1 or 2 pieces. A one-piece pouch has the skin barrier piece and the pouch itself in one unit. A two-piece pouch has a skin barrier with a separate pouch that snaps on and off of the skin barrier. Either way, you should empty the urostomy when it is only  to  full. Do not let more urine build up. This could cause the pouch to leak. If you are concerned with odor, ask your caregiver about ostomy deodorizer solutions you can use in the urostomy bag. EMPTYING YOUR UROSTOMY POUCH You may get lessons on how to empty your pouch from a wound-ostomy nurse before you leave the hospital. Here are the basic  steps:  Wash your hands with soap and water.  Open the valve on the tail end of the pouch.  Allow the bag to drain into the toilet.  Close the valve and dry it.  Wash your hands again. AT NIGHT You may need to set an alarm to get up a couple of times each night so that your pouch does not become too full. Otherwise, you may prefer to connect your pouch to a larger drainage system. If you use one of these larger systems, you will need to clean it carefully after each use:  Wash your hands with soap and water.  After emptying the drainage system in the morning, wash it with warm soapy water and rinse it carefully.  Hang it up to dry for the day.  Make a 1 to 1 water and vinegar mixture. Use this to clean the drainage system once a week.  Keep a cap on the end of the tube when you are not using it. This is to keep the system clean.  If the system cracks or starts to leak, replace it with a new system.  Wash your hands again. CHANGING YOUR UROSTOMY POUCH Change your urostomy pouch about every 3 to 4 days for the first 6 weeks, then every 5 to 7 days. Always change the bag sooner if you begin to notice any discomfort or irritation of the  skin around the stoma. A wound-ostomy nurse may teach you how to change your pouch before you leave the hospital. Here are the basic steps:  Lay out your supplies.  Wash your hands with soap and water.  Carefully remove the old pouch.  Wash the stoma and allow it to dry. Men may be advised to shave any hair around the stoma very carefully. This will make the adhesive stick better.  Use the stoma measuring guide that comes with your pouch set to decide what size hole you will need to cut in the skin barrier piece. You want to choose the smallest possible size that will hold the stoma but will not touch it.  Use the guide to trace the circle on the back of the skin barrier piece. Cut out the hole.  Hold the skin barrier piece over the stoma to  make sure the hole is the correct size.  Remove the adhesive paper backing from the skin barrier piece.  Clean and dry the skin around the stoma again.  Carefully fit the skin barrier piece over your stoma.  If you are using a two-piece pouch, snap the pouch onto the skin barrier piece.  Close the tail of the pouch.  Put your hand over the top of the skin barrier piece to help warm it for about 5 minutes, so that it conforms to your body better.  Wash your hands again. DIET TIPS  Drink about eight 8 oz glasses of water each day.  You can continue to follow your usual diet, although some foods cause the urine to have a strong odor. You may want to cut back on some of these foods, such as:  Asparagus.  Fish.  Eggs.  Cheese.  Coffee.  Garlic.  Spicy foods.  Medicines can also contribute to odor, including:  Vitamins.  Antibiotic medicine. GENERAL TIPS  It is normal to notice some mucus in your urine.  You can shower with or without the bag in place.  Always keep the bag on if you are bathing or swimming.  If your bag gets wet, you can dry it with a blow-dryer set to cool.  Avoid wearing tight clothing directly over your stoma so that it does not become irritated or bleed. Tight clothing can also prevent the urine from draining into the pouch, which can cause it to leak.  It is helpful to always have an extra skin barrier and a pouch with you when traveling. Do not leave them anywhere too warm, as parts of them can melt.  Do not let your seat belt rest on your stoma. Try to keep the seat belt either above or below the stoma, or use a tiny pillow to cushion it.  You can still participate in sports, but you should avoid activities in which there is a risk of getting hit in the abdomen.  You can still have sex. It is a good idea to empty your pouch prior to sex. Some people and their partners feel very comfortable seeing the pouch during sex. Others choose to wear  lingerie or a T-shirt that covers the device. SEEK IMMEDIATE MEDICAL CARE IF:  You notice a change in the size or color of the stoma, especially if it becomes very red, purple, black, or pale white.  You have cloudy, bad smelling urine.  You have increased amounts of mucus in the urine.  You have bloody urine.  You have back pain.  You have abdominal pain, nausea, vomiting, or  bloating.  There is anything unusual protruding from the urostomy.  You have irritated or red skin around the urostomy.   This information is not intended to replace advice given to you by your health care provider. Make sure you discuss any questions you have with your health care provider.   Document Released: 05/11/2003 Document Revised: 07/31/2011 Document Reviewed: 10/05/2010 Elsevier Interactive Patient Education [employer].

## 2015-08-27 NOTE — ED Notes (Signed)
Pt came to ED via EMS c/o urostomy break breaking. Pt only has right kidney. Pt c/o right sided abdominal pain, rating it a 5/10 and describes it as a "sticking feeling". VS stable.

## 2015-09-01 NOTE — ED Provider Notes (Signed)
Time Seen: Approximately 1450  I have reviewed the triage notes  Chief Complaint: No chief complaint on file.   History of Present Illness: Larry Morrison is a 45 y.o. male *who has a history of right-sided urostomy. Patient states he comes by IV by EMS because he didn't have much in way of other transportation for replacement of his urostomy bag. He denies any new physical complaints to this historian. He states he always has some mild right-sided abdominal discomfort. He denies any fever, nausea, vomiting.   Past Medical History  Diagnosis Date  . Renal disorder   . Hypertension   . Aneurysm ([place])   . Foot drop   . Stroke [facility])     There are no active problems to display for this patient.   Past Surgical History  Procedure Laterality Date  . Nephrectomy    . Nephrectomy      Past Surgical History  Procedure Laterality Date  . Nephrectomy    . Nephrectomy      Current Outpatient Rx  Name  Route  Sig  Dispense  Refill  . HYDROcodone-acetaminophen (NORCO/VICODIN) 5-325 MG per tablet   Oral   Take 1 tablet by mouth every 6 (six) hours as needed for moderate pain. Patient not taking: Reported on 04/04/2015   15 tablet   0   . oxyCODONE-acetaminophen (ROXICET) 5-325 MG tablet   Oral   Take 1 tablet by mouth every 6 (six) hours as needed.   8 tablet   0     Allergies:  Review of patient's allergies indicates no known allergies.  Family History: No family history on file.  Social History: Social History  Substance Use Topics  . Smoking status: Current Some Day Smoker    Types: Cigarettes  . Smokeless tobacco: Never Used  . Alcohol Use: No     Review of Systems:   10 point review of systems was performed and was otherwise negative:  Constitutional: No fever Eyes: No visual disturbances ENT: No sore throat, ear pain Cardiac: No chest pain Respiratory: No shortness of breath, wheezing, or stridor Abdomen: No New abdominal pain, no vomiting, No  diarrhea Endocrine: No weight loss, No night sweats Extremities: No peripheral edema, cyanosis Skin: No rashes, easy bruising Neurologic: NoNew focal weakness, trouble with speech or swollowing Urologic: No dysuria, Hematuria, or urinary frequency   Physical Exam:  ED Triage Vitals  Enc Vitals Group     BP 08/27/15 1222 143/83 mmHg     Pulse Rate 08/27/15 1222 82     Resp --      Temp 08/27/15 1222 97.6 F (36.4 C)     Temp Source 08/27/15 1222 Oral     SpO2 08/27/15 1222 99 %     Weight 08/27/15 1222 103 lb (46.72 kg)     Height 08/27/15 1222 5\' 2"  (1.575 m)     Head Cir --      Peak Flow --      Pain Score --      Pain Loc --      Pain Edu? --      Excl. in [place]? --     General: Awake , Alert , and Oriented times 3; GCS 15 Head: Normal cephalic , atraumatic Eyes: Pupils equal , round, reactive to light Nose/Throat: No nasal drainage, patent upper airway without erythema or exudate.  Neck: Supple, Full range of motion, No anterior adenopathy or palpable thyroid masses Lungs: Clear to ascultation without wheezes ,  rhonchi, or rales Heart: Regular rate, regular rhythm without murmurs , gallops , or rubs Abdomen: Soft, non tender without rebound, guarding , or rigidity; bowel sounds positive and symmetric in all 4 quadrants. No organomegaly .        Extremities: 2 plus symmetric pulses. No edema, clubbing or cyanosis Neurologic: normal ambulation, Motor symmetric without deficits, sensory intact Skin: warm, dry, no rashes     ED Course:  Patient's left without being reevaluated after his urostomy bag was replaced. The patient otherwise had no new physical complaints.     Final Clinical Impression: *  Final diagnoses:  Attention to urostomy [facility])     Plan: * Outpatient management Patient was advised to return immediately if condition worsens. Patient was advised to follow up with their primary care physician or other specialized physicians involved in their  outpatient care. The patient and/or family member/power of attorney had laboratory results reviewed at the bedside. All questions and concerns were addressed and appropriate discharge instructions were distributed by the nursing staff.             Daymon Larsen, MD 09/01/15 [phone]

## 2015-12-01 DIAGNOSIS — F17200 Nicotine dependence, unspecified, uncomplicated: Secondary | ICD-10-CM | POA: Insufficient documentation

## 2015-12-01 DIAGNOSIS — Z936 Other artificial openings of urinary tract status: Secondary | ICD-10-CM | POA: Insufficient documentation

## 2015-12-01 DIAGNOSIS — G8928 Other chronic postprocedural pain: Secondary | ICD-10-CM | POA: Insufficient documentation

## 2015-12-08 ENCOUNTER — Encounter: Payer: Self-pay | Admitting: *Deleted

## 2015-12-08 ENCOUNTER — Emergency Department
Admission: EM | Admit: 2015-12-08 | Discharge: 2015-12-08 | Disposition: A | Discharge status: Home / Self Care | Payer: Medicaid Other | Attending: Emergency Medicine | Admitting: Emergency Medicine

## 2015-12-08 DIAGNOSIS — T83031A Leakage of indwelling urethral catheter, initial encounter: Secondary | ICD-10-CM

## 2015-12-08 DIAGNOSIS — Z8673 Personal history of transient ischemic attack (TIA), and cerebral infarction without residual deficits: Secondary | ICD-10-CM | POA: Diagnosis not present

## 2015-12-08 DIAGNOSIS — Y733 Surgical instruments, materials and gastroenterology and urology devices (including sutures) associated with adverse incidents: Secondary | ICD-10-CM | POA: Diagnosis not present

## 2015-12-08 DIAGNOSIS — F17210 Nicotine dependence, cigarettes, uncomplicated: Secondary | ICD-10-CM | POA: Diagnosis not present

## 2015-12-08 DIAGNOSIS — I10 Essential (primary) hypertension: Secondary | ICD-10-CM | POA: Diagnosis not present

## 2015-12-08 DIAGNOSIS — Z79899 Other long term (current) drug therapy: Secondary | ICD-10-CM | POA: Insufficient documentation

## 2015-12-08 DIAGNOSIS — F1290 Cannabis use, unspecified, uncomplicated: Secondary | ICD-10-CM | POA: Insufficient documentation

## 2015-12-08 NOTE — ED Notes (Addendum)
States his catheter bag is leaking and he could not get to [facility] till next month for his appt, states a crack in the bag, states he just needs a bag replacement

## 2015-12-08 NOTE — ED Notes (Signed)
Pt cath bag changed at this time by pt

## 2015-12-08 NOTE — ED Provider Notes (Signed)
[facility] Emergency Department Provider Note  ____________________________________________  Time seen: Approximately 12:11 PM  I have reviewed the triage vital signs and the nursing notes.   HISTORY  Chief Complaint catheter problem     HPI Larry Morrison is a 45 y.o. male since for evaluation via EMS for a replacement of his current Foley catheter bag. States his current bag is leaking. Desires a new bag. Past medical history significant for: Denies any pain at this time.   Past Medical History  Diagnosis Date  . Renal disorder   . Hypertension   . Aneurysm ([place])   . Foot drop   . Stroke [facility])     There are no active problems to display for this patient.   Past Surgical History  Procedure Laterality Date  . Nephrectomy    . Nephrectomy      Current Outpatient Rx  Name  Route  Sig  Dispense  Refill  . HYDROcodone-acetaminophen (NORCO/VICODIN) 5-325 MG per tablet   Oral   Take 1 tablet by mouth every 6 (six) hours as needed for moderate pain. Patient not taking: Reported on 04/04/2015   15 tablet   0   . oxyCODONE-acetaminophen (ROXICET) 5-325 MG tablet   Oral   Take 1 tablet by mouth every 6 (six) hours as needed.   8 tablet   0     Allergies Review of patient's allergies indicates no known allergies.  History reviewed. No pertinent family history.  Social History Social History  Substance Use Topics  . Smoking status: Current Some Day Smoker    Types: Cigarettes  . Smokeless tobacco: Never Used  . Alcohol Use: No    Review of Systems Constitutional: No fever/chills Cardiovascular: Denies chest pain. Respiratory: Denies shortness of breath. Gastrointestinal: No abdominal pain.  No nausea, no vomiting.  No diarrhea.  No constipation. Genitourinary: Negative for dysuria. Musculoskeletal: Negative for back pain. Skin: Negative for rash. Neurological: Negative for headaches, focal weakness or numbness.  10-point ROS  otherwise negative.  ____________________________________________   PHYSICAL EXAM:  VITAL SIGNS: ED Triage Vitals  Enc Vitals Group     BP 12/08/15 1152 124/82 mmHg     Pulse Rate 12/08/15 1152 73     Resp 12/08/15 1152 18     Temp 12/08/15 1152 97.9 F (36.6 C)     Temp Source 12/08/15 1152 Oral     SpO2 12/08/15 1152 100 %     Weight 12/08/15 1152 130 lb (58.968 kg)     Height 12/08/15 1152 5\' 2"  (1.575 m)     Head Cir --      Peak Flow --      Pain Score 12/08/15 1153 3     Pain Loc --      Pain Edu? --      Excl. in [place]? --     Constitutional: Alert and oriented. Well appearing and in no acute distress.  Cardiovascular: Normal rate, regular rhythm. Grossly normal heart sounds.  Good peripheral circulation. Respiratory: Normal respiratory effort.  No retractions. Lungs CTAB. Gastrointestinal: Soft and nontender. No distention. No CVA tenderness. Skin:  Skin is warm, dry and intact. No rash noted. Psychiatric: Mood and affect are normal. Speech and behavior are normal.  ____________________________________________   LABS (all labs ordered are listed, but only abnormal results are displayed)  Labs Reviewed - No data to display ____________________________________________  EKG   ____________________________________________  RADIOLOGY   ____________________________________________   PROCEDURES  Procedure(s) performed: None  Critical Care performed: No  ____________________________________________   INITIAL IMPRESSION / ASSESSMENT AND PLAN / ED COURSE  Pertinent labs & imaging results that were available during my care of the patient were reviewed by me and considered in my medical decision making (see chart for details).  History of Foley catheter bag in place. Patient states that his current bag is leaking needs new back. Denies any other complaints at this time. The Foley catheter bag given and patient discharged home without  incident. ____________________________________________   FINAL CLINICAL IMPRESSION(S) / ED DIAGNOSES  Final diagnoses:  Leakage of indwelling urethral catheter, initial encounter     This chart was dictated using voice recognition software/Dragon. Despite best efforts to proofread, errors can occur which can change the meaning. Any change was purely unintentional.   Arlyss Repress, PA-C 12/08/15 [place], MD 12/08/15 [phone]

## 2015-12-08 NOTE — ED Notes (Signed)
Pt c/o  Cath bag leaking, pt denies any other symptoms at this time.  A&O

## 2016-06-10 DIAGNOSIS — R8281 Pyuria: Secondary | ICD-10-CM | POA: Insufficient documentation

## 2016-10-27 ENCOUNTER — Emergency Department: Payer: Medicaid Other | Admitting: Anesthesiology

## 2016-10-27 ENCOUNTER — Emergency Department: Payer: Medicaid Other

## 2016-10-27 ENCOUNTER — Encounter: Payer: Self-pay | Admitting: Emergency Medicine

## 2016-10-27 ENCOUNTER — Encounter
Admission: EM | Disposition: A | Discharge status: Home / Self Care | Payer: Self-pay | Source: Home / Self Care | Attending: Emergency Medicine

## 2016-10-27 ENCOUNTER — Emergency Department
Admission: EM | Admit: 2016-10-27 | Discharge: 2016-10-27 | Disposition: A | Discharge status: Home / Self Care | Payer: Medicaid Other | Attending: Emergency Medicine | Admitting: Emergency Medicine

## 2016-10-27 DIAGNOSIS — N4400 Torsion of testis, unspecified: Secondary | ICD-10-CM | POA: Insufficient documentation

## 2016-10-27 DIAGNOSIS — R1909 Other intra-abdominal and pelvic swelling, mass and lump: Secondary | ICD-10-CM | POA: Diagnosis present

## 2016-10-27 DIAGNOSIS — N184 Chronic kidney disease, stage 4 (severe): Secondary | ICD-10-CM | POA: Diagnosis not present

## 2016-10-27 DIAGNOSIS — Z905 Acquired absence of kidney: Secondary | ICD-10-CM | POA: Diagnosis not present

## 2016-10-27 DIAGNOSIS — F17210 Nicotine dependence, cigarettes, uncomplicated: Secondary | ICD-10-CM | POA: Diagnosis not present

## 2016-10-27 DIAGNOSIS — Z936 Other artificial openings of urinary tract status: Secondary | ICD-10-CM | POA: Insufficient documentation

## 2016-10-27 DIAGNOSIS — Z8673 Personal history of transient ischemic attack (TIA), and cerebral infarction without residual deficits: Secondary | ICD-10-CM | POA: Insufficient documentation

## 2016-10-27 DIAGNOSIS — I129 Hypertensive chronic kidney disease with stage 1 through stage 4 chronic kidney disease, or unspecified chronic kidney disease: Secondary | ICD-10-CM | POA: Diagnosis not present

## 2016-10-27 HISTORY — PX: SCROTAL EXPLORATION: SHX2386

## 2016-10-27 HISTORY — PX: ORCHIECTOMY: SHX2116

## 2016-10-27 HISTORY — DX: Torsion of testis, unspecified: N44.00

## 2016-10-27 HISTORY — PX: ORCHIOPEXY: SHX479

## 2016-10-27 HISTORY — DX: Reserved for concepts with insufficient information to code with codable children: IMO0002

## 2016-10-27 LAB — CBC
HCT: 41.2 % (ref 40.0–52.0)
HEMOGLOBIN: 13.7 g/dL (ref 13.0–18.0)
MCH: 30.9 pg (ref 26.0–34.0)
MCHC: 33.1 g/dL (ref 32.0–36.0)
MCV: 93.4 fL (ref 80.0–100.0)
Platelets: 208 10*3/uL (ref 150–440)
RBC: 4.42 MIL/uL (ref 4.40–5.90)
RDW: 13.1 % (ref 11.5–14.5)
WBC: 12 10*3/uL — ABNORMAL HIGH (ref 3.8–10.6)

## 2016-10-27 LAB — BASIC METABOLIC PANEL
Anion gap: 11 (ref 5–15)
BUN: 35 mg/dL — ABNORMAL HIGH (ref 6–20)
CHLORIDE: 106 mmol/L (ref 101–111)
CO2: 21 mmol/L — ABNORMAL LOW (ref 22–32)
CREATININE: 2.93 mg/dL — AB (ref 0.61–1.24)
Calcium: 9.3 mg/dL (ref 8.9–10.3)
GFR, EST AFRICAN AMERICAN: 28 mL/min — AB (ref >60)
GFR, EST NON AFRICAN AMERICAN: 24 mL/min — AB (ref >60)
Glucose, Bld: 80 mg/dL (ref 65–99)
POTASSIUM: 4.6 mmol/L (ref 3.5–5.1)
SODIUM: 138 mmol/L (ref 135–145)

## 2016-10-27 SURGERY — EXPLORATION, SCROTUM
Anesthesia: General | Laterality: Right | Wound class: Clean Contaminated

## 2016-10-27 MED ORDER — SUCCINYLCHOLINE CHLORIDE 20 MG/ML IJ SOLN
INTRAMUSCULAR | Status: DC | PRN
  Administered 2016-10-27: 60 mg via INTRAVENOUS

## 2016-10-27 MED ORDER — GLYCOPYRROLATE 0.2 MG/ML IJ SOLN
INTRAMUSCULAR | Status: DC | PRN
  Administered 2016-10-27: 0.2 mg via INTRAVENOUS

## 2016-10-27 MED ORDER — FENTANYL CITRATE (PF) 100 MCG/2ML IJ SOLN: 25.0000 ug | INTRAMUSCULAR | Status: DC | PRN

## 2016-10-27 MED ORDER — ONDANSETRON HCL 4 MG/2ML IJ SOLN
INTRAMUSCULAR | Status: DC | PRN
  Administered 2016-10-27: 4 mg via INTRAVENOUS

## 2016-10-27 MED ORDER — PROPOFOL 10 MG/ML IV BOLUS
INTRAVENOUS | Status: DC | PRN
  Administered 2016-10-27: 110 mg via INTRAVENOUS
  Administered 2016-10-27: 50 mg via INTRAVENOUS

## 2016-10-27 MED ORDER — FENTANYL CITRATE (PF) 100 MCG/2ML IJ SOLN
INTRAMUSCULAR | Status: DC | PRN
  Administered 2016-10-27 (×2): 50 ug via INTRAVENOUS

## 2016-10-27 MED ORDER — DOCUSATE SODIUM 100 MG PO CAPS: 100.0000 mg | ORAL_CAPSULE | Freq: Two times a day (BID) | ORAL | 0 refills | Status: DC

## 2016-10-27 MED ORDER — NEOSTIGMINE METHYLSULFATE 10 MG/10ML IV SOLN
INTRAVENOUS | Status: DC | PRN
  Administered 2016-10-27: 1 mg via INTRAVENOUS

## 2016-10-27 MED ORDER — SODIUM CHLORIDE 0.9 % IV SOLN
INTRAVENOUS | Status: DC | PRN
  Administered 2016-10-27: 19:00:00 via INTRAVENOUS

## 2016-10-27 MED ORDER — CEFAZOLIN SODIUM-DEXTROSE 1-4 GM/50ML-% IV SOLN
1.0000 g | Freq: Once | INTRAVENOUS | Status: AC
  Administered 2016-10-27: 1 g via INTRAVENOUS

## 2016-10-27 MED ORDER — PROPOFOL 10 MG/ML IV BOLUS
INTRAVENOUS | Status: AC
  Filled 2016-10-27: qty 20

## 2016-10-27 MED ORDER — LIDOCAINE HCL (CARDIAC) 20 MG/ML IV SOLN
INTRAVENOUS | Status: DC | PRN
  Administered 2016-10-27: 60 mg via INTRAVENOUS

## 2016-10-27 MED ORDER — HYDROMORPHONE HCL 1 MG/ML IJ SOLN: 1.0000 mg | Freq: Once | INTRAMUSCULAR | Status: DC

## 2016-10-27 MED ORDER — OXYCODONE-ACETAMINOPHEN 5-325 MG PO TABS: 1.0000 | ORAL_TABLET | Freq: Four times a day (QID) | ORAL | 0 refills | Status: DC | PRN

## 2016-10-27 MED ORDER — PROMETHAZINE HCL 25 MG/ML IJ SOLN: 6.2500 mg | INTRAMUSCULAR | Status: DC | PRN

## 2016-10-27 MED ORDER — BUPIVACAINE HCL 0.5 % IJ SOLN
INTRAMUSCULAR | Status: DC | PRN
  Administered 2016-10-27: 10 mL

## 2016-10-27 MED ORDER — FENTANYL CITRATE (PF) 100 MCG/2ML IJ SOLN
INTRAMUSCULAR | Status: AC
  Filled 2016-10-27: qty 2

## 2016-10-27 MED ORDER — ROCURONIUM BROMIDE 100 MG/10ML IV SOLN
INTRAVENOUS | Status: DC | PRN
  Administered 2016-10-27 (×2): 10 mg via INTRAVENOUS

## 2016-10-27 MED ORDER — PHENYLEPHRINE HCL 10 MG/ML IJ SOLN
INTRAMUSCULAR | Status: DC | PRN
  Administered 2016-10-27 (×2): 100 ug via INTRAVENOUS

## 2016-10-27 SURGICAL SUPPLY — 54 items
BLADE SURG 15 STRL LF DISP TIS (BLADE) ×2 IMPLANT
BLADE SURG 15 STRL SS (BLADE) ×2
CANISTER SUCT 1200ML W/VALVE (MISCELLANEOUS) ×4 IMPLANT
CHLORAPREP W/TINT 26ML (MISCELLANEOUS) ×4 IMPLANT
CLOSURE WOUND 1/2 X4 (GAUZE/BANDAGES/DRESSINGS)
CNTNR SPEC 2.5X3XGRAD LEK (MISCELLANEOUS)
CONT SPEC 4OZ STER OR WHT (MISCELLANEOUS)
CONTAINER SPEC 2.5X3XGRAD LEK (MISCELLANEOUS) IMPLANT
DERMABOND ADVANCED (GAUZE/BANDAGES/DRESSINGS) ×2
DERMABOND ADVANCED .7 DNX12 (GAUZE/BANDAGES/DRESSINGS) ×2 IMPLANT
DRAIN PENROSE 1/4X12 LTX (DRAIN) ×4 IMPLANT
DRAIN PENROSE 5/8X12 LTX STRL (DRAIN) ×4 IMPLANT
DRAPE LAPAROTOMY 77X122 PED (DRAPES) ×4 IMPLANT
DRSG TEGADERM 4X4.75 (GAUZE/BANDAGES/DRESSINGS) IMPLANT
DRSG TELFA 4X3 1S NADH ST (GAUZE/BANDAGES/DRESSINGS) IMPLANT
ELECT REM PT RETURN 9FT ADLT (ELECTROSURGICAL) ×4
ELECTRODE REM PT RTRN 9FT ADLT (ELECTROSURGICAL) ×2 IMPLANT
GAUZE FLUFF 18X24 1PLY STRL (GAUZE/BANDAGES/DRESSINGS) ×4 IMPLANT
GLOVE BIO SURGEON STRL SZ 6.5 (GLOVE) ×3 IMPLANT
GLOVE BIO SURGEONS STRL SZ 6.5 (GLOVE) ×1
GLOVE BIOGEL PI IND STRL 6.5 (GLOVE) ×2 IMPLANT
GLOVE BIOGEL PI INDICATOR 6.5 (GLOVE) ×2
GOWN STRL REUS W/ TWL LRG LVL3 (GOWN DISPOSABLE) ×4 IMPLANT
GOWN STRL REUS W/TWL LRG LVL3 (GOWN DISPOSABLE) ×4
KIT RM TURNOVER STRD PROC AR (KITS) ×4 IMPLANT
LABEL OR SOLS (LABEL) ×4 IMPLANT
NEEDLE HYPO 25GX1X1/2 BEV (NEEDLE) ×4 IMPLANT
NEEDLE HYPO 25X1 1.5 SAFETY (NEEDLE) ×4 IMPLANT
NS IRRIG 500ML POUR BTL (IV SOLUTION) ×4 IMPLANT
PACK BASIN MINOR ARMC (MISCELLANEOUS) ×4 IMPLANT
PREP PVP WINGED SPONGE (MISCELLANEOUS) ×4 IMPLANT
SOL PREP PVP 2OZ (MISCELLANEOUS) ×4
SOLUTION PREP PVP 2OZ (MISCELLANEOUS) ×2 IMPLANT
SPONGE KITTNER 5P (MISCELLANEOUS) ×4 IMPLANT
STRIP CLOSURE SKIN 1/2X4 (GAUZE/BANDAGES/DRESSINGS) IMPLANT
SUPPORETR ATHLETIC LG (MISCELLANEOUS) ×2 IMPLANT
SUPPORTER ATHLETIC LG (MISCELLANEOUS) ×4
SUT CHROMIC 4 0 RB 1X27 (SUTURE) ×4 IMPLANT
SUT CHROMIC GUT BROWN 0 54 (SUTURE) IMPLANT
SUT CHROMIC GUT BROWN 0 54IN (SUTURE)
SUT MNCRL 4-0 (SUTURE) ×2
SUT MNCRL 4-0 27XMFL (SUTURE) ×2
SUT MON AB 3-0 SH 27 (SUTURE) ×4 IMPLANT
SUT PROLENE 4 0 PS 2 18 (SUTURE) ×8 IMPLANT
SUT SILK 0 SH 30 (SUTURE) ×8 IMPLANT
SUT SILK 2 0 SH (SUTURE) ×4 IMPLANT
SUT VIC AB 3-0 SH 27 (SUTURE) ×2
SUT VIC AB 3-0 SH 27X BRD (SUTURE) ×2 IMPLANT
SUT VIC AB 4-0 FS2 27 (SUTURE) ×4 IMPLANT
SUTURE MNCRL 4-0 27XMF (SUTURE) ×2 IMPLANT
SYR 20CC LL (SYRINGE) ×4 IMPLANT
SYR BULB IRRIG 60ML STRL (SYRINGE) ×4 IMPLANT
SYRINGE 10CC LL (SYRINGE) ×4 IMPLANT
WATER STERILE IRR 1000ML POUR (IV SOLUTION) ×4 IMPLANT

## 2016-10-27 NOTE — ED Notes (Signed)
D/w Dr. Joni Fears patient's chief complaint. New orders for [place] given.

## 2016-10-27 NOTE — H&P (Signed)
Urology Consult  I have been asked to see the patient by Dr. Mable Paris , for evaluation and management of right.  Chief Complaint: right testicular torsion  History of Present Illness: Larry Morrison is a 46 y.o. year old who presents to the emergency room with acute onset right testicular pain at 1 AM this morning.  The pain awoke him from sleep and was acute in nature. It is worse with movement, improves with rest.  Patient is somewhat of a poor historian but describes awaking with severe pain.  He denies scrotal pain prior to this.  He does have associated nausea.    He ultimately presented to the emergency room late this afternoon. Scrotal ultrasound is consistent with an enlarged right testicle without demonstrable Doppler flow.   He does have multiple medical comorbidities and is followed at [facility]. 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   Past Medical History:  Diagnosis Date  . Aneurysm ([place])   . Foot drop   . History of nephrostomy ([place])   . Hypertension   . Renal disorder   . Stroke [facility])     Past Surgical History:  Procedure Laterality Date  . NEPHRECTOMY    . NEPHRECTOMY      Home Medications:  Current Meds  Medication Sig  . sodium chloride 0.9 % injection Inject 10 mLs into the vein daily.     Allergies: No Known Allergies  History reviewed. No pertinent family history.  Social History:  reports that he has been smoking Cigarettes.  He has never used smokeless tobacco. He reports that he uses drugs, including Marijuana. He reports that he does not drink alcohol.  ROS: A complete review of systems was performed.  All systems are negative except for pertinent findings as noted.  Physical Exam:  Vital signs in last 24 hours: Temp:  [98.1 F (36.7 C)] 98.1 F (36.7 C) (06/08  1603) Pulse Rate:  [90] 90 (06/08 1603) Resp:  [18] 18 (06/08 1603) BP: (142)/(88) 142/88 (06/08 1603) SpO2:  [97 %] 97 % (06/08 1603) Weight:  [105 lb (47.6 kg)] 105 lb (47.6 kg) (06/08 1604) Constitutional:  Alert and oriented, No acute distress HEENT: [place] AT, moist mucus membranes.  Trachea midline, no masses.  Poor dentition. Cardiovascular: Regular rate and rhythm, no clubbing, cyanosis, or edema. Respiratory: Normal respiratory effort, lungs clear bilaterally GI: Abdomen is soft, nontender, nondistended, no abdominal masses GU: Right nephrostomy tube draining slightly cloudy yellow urine, somewhat foul-smelling, site clean and dry. Circumcised phallus without discharge. Right testicle is enlarged, firm, exquisitely tender and difficult to examine. Left testicle normal. No overlying scrotal skin changes or fluctuance. Skin: No rashes, bruises or suspicious lesions Lymph: No cervical or inguinal adenopathy Neurologic: Grossly intact, right-sided weakness appreciated. Psychiatric: Normal mood and affect   Laboratory Data:   Recent Labs  10/27/16 1618  WBC 12.0*  HGB 13.7  HCT 41.2    Recent Labs  10/27/16 1618  NA 138  K 4.6  CL 106  CO2 21*  GLUCOSE 80  BUN 35*  CREATININE 2.93*  CALCIUM 9.3   No results for input(s): LABPT, INR in the last 72 hours. No results for input(s): LABURIN in the last 72 hours. Results for orders placed or performed during the hospital encounter of 04/04/15  Urine culture     Status: None  Collection Time: 04/04/15  9:20 PM  Result Value Ref Range Status   Specimen Description URINE, RANDOM  Final   Special Requests NONE  Final   Culture MULTIPLE SPECIES PRESENT, SUGGEST RECOLLECTION  Final   Report Status 04/06/2015 FINAL  Final     Radiologic Imaging: US Scrotum  Result Date: 10/27/2016 CLINICAL DATA:  Acute right scrotal pain and swelling. EXAM: SCROTAL ULTRASOUND DOPPLER ULTRASOUND OF THE TESTICLES TECHNIQUE: Complete  ultrasound examination of the testicles, epididymis, and other scrotal structures was performed. Color and spectral Doppler ultrasound were also utilized to evaluate blood flow to the testicles. COMPARISON:  None. FINDINGS: Right testicle Measurements: 4.8 x 3.6 x 3.2 cm. No mass or microlithiasis visualized. Doppler does not demonstrate definite arterial or venous flow within the testicle concerning for torsion. The testicle is enlarged compared to the left. Left testicle Measurements: 3.3 x 2.2 x 2.0 cm. No mass or microlithiasis visualized. 4 mm cyst is noted. Right epididymis:  Multiple small cysts are seen. Left epididymis:  Normal in size and appearance. Hydrocele: Large right hydrocele is noted. Minimal left hydrocele is noted. Varicocele:  None visualized. Pulsed Doppler interrogation of left testis demonstrates normal low resistance arterial and venous waveforms. No arterial or venous flow was noted in right testicle. IMPRESSION: Large right hydrocele. Doppler does not demonstrate definite arterial or venous flow within the right testicle, concerning for testicular torsion. Critical Value/emergent results were called by telephone at the time of interpretation on 10/27/2016 at 5:18 pm to Dr. Carrie Mew , who verbally acknowledged these results. Electronically Signed   By: Marijo Conception, M.D.   On: 10/27/2016 17:19   [place] Art/ven Flow Abd Pelv Doppler  Result Date: 10/27/2016 CLINICAL DATA:  Acute right scrotal pain and swelling. EXAM: SCROTAL ULTRASOUND DOPPLER ULTRASOUND OF THE TESTICLES TECHNIQUE: Complete ultrasound examination of the testicles, epididymis, and other scrotal structures was performed. Color and spectral Doppler ultrasound were also utilized to evaluate blood flow to the testicles. COMPARISON:  None. FINDINGS: Right testicle Measurements: 4.8 x 3.6 x 3.2 cm. No mass or microlithiasis visualized. Doppler does not demonstrate definite arterial or venous flow within the testicle  concerning for torsion. The testicle is enlarged compared to the left. Left testicle Measurements: 3.3 x 2.2 x 2.0 cm. No mass or microlithiasis visualized. 4 mm cyst is noted. Right epididymis:  Multiple small cysts are seen. Left epididymis:  Normal in size and appearance. Hydrocele: Large right hydrocele is noted. Minimal left hydrocele is noted. Varicocele:  None visualized. Pulsed Doppler interrogation of left testis demonstrates normal low resistance arterial and venous waveforms. No arterial or venous flow was noted in right testicle. IMPRESSION: Large right hydrocele. Doppler does not demonstrate definite arterial or venous flow within the right testicle, concerning for testicular torsion. Critical Value/emergent results were called by telephone at the time of interpretation on 10/27/2016 at 5:18 pm to Dr. Carrie Mew , who verbally acknowledged these results. Electronically Signed   By: Marijo Conception, M.D.   On: 10/27/2016 17:19   Scrotal ultrasound was personally reviewed today.  Impression/ Plan:  1) Right testicular torsion vs. severe right epididymal orchitis with absent blood flow-   I have recommended proceeding to the operating room emergently for scrotal exploration. Given his history of chronic indwelling nephrostomy tube and his age and presentation, highly suspicious this is more likely a severe epididymoorchitis, but despite that there is no blood flow recent severe pain. As such, we will plan on scrotal exploration, possible  bilateral orchidopexy, possible right orchiectomy. He understands the risk of the procedure. Initially, he was hesitant to proceed with surgery here but understands that this may be time sensitive. Ultimately, he has elected to proceed with surgery.  All questions were answered.  We will plan on discharge following the procedure with pain medication as needed.  2) CKD- appears to be stable    10/27/2016, 6:15 PM  Hollice Espy,  MD

## 2016-10-27 NOTE — Discharge Instructions (Signed)
Testicular Torsion Testicular torsion is a twisting of the spermatic cord, artery, and vein that go to the testicle. This twisting cuts off the blood supply to everything in the sac that contains the testes, blood vessels, and part of the spermatic cord (scrotum). Testicular torsion is most commonly seen in newborn and adolescent males. It can also occur before birth. Testicular torsion requires emergency treatment. The testicle usually can be saved if the torsion is treated within 6 hours of onset. If the torsion is left untreated for too long, the testicle will die and have to be removed. What are the causes? Torsion can be caused by a hit on the scrotum or by certain movements during exercise. In some males, testicular torsion is more common because the connection of their testicle to a specific tissue in their scrotum developed in the wrong place, allowing the testicle to rotate and the cord to get twisted. What are the signs or symptoms? The main symptom of testicular torsion is pain in your testicle. The scrotum may be swollen, red, hard, and very tender. There will be excess fluid in the tissue (edema).The testicle may be higher than normal in the scrotum. The skin of the scrotum may be stuck to the testicle. You may have nausea, vomiting, and a fever. How is this diagnosed? Often testicular torsion is diagnosed through a physical exam. Sometimes imaging exams and tests to measure blood flow may be done. How is this treated? A manual untwisting of the testicle may be done when the testicle is still mobile and the maneuver is not too painful. However, surgery usually is necessary and should be done as soon as possible after torsion occurs. During surgery, the testicle is untwisted and evaluated and possibly removed. This information is not intended to replace advice given to you by your health care provider. Make sure you discuss any questions you have with your health care provider. Document  Released: 05/08/2005 Document Revised: 11/26/2015 Document Reviewed: 10/28/2012 Elsevier Interactive Patient Education  2017 [employer].

## 2016-10-27 NOTE — Anesthesia Postprocedure Evaluation (Signed)
Anesthesia Post Note  Patient: Jailan Trimm  Procedure(s) Performed: Procedure(s) (LRB): SCROTUM EXPLORATION (Bilateral) ORCHIOPEXY ADULT (Bilateral) PSB ORCHIECTOMY (Right)  Patient location during evaluation: PACU Anesthesia Type: General Level of consciousness: awake and alert Pain management: pain level controlled Vital Signs Assessment: post-procedure vital signs reviewed and stable Respiratory status: spontaneous breathing, nonlabored ventilation, respiratory function stable and patient connected to nasal cannula oxygen Cardiovascular status: blood pressure returned to baseline and stable Postop Assessment: no signs of nausea or vomiting Anesthetic complications: no     Last Vitals:  Vitals:   10/27/16 2029 10/27/16 2034  BP:  128/87  Pulse: 84 71  Resp: (!) 24 (!) 27  Temp: 36.6 C     Last Pain:  Vitals:   10/27/16 2029  TempSrc:   PainSc: 0-No pain                 Martha Clan

## 2016-10-27 NOTE — ED Triage Notes (Signed)
First Nurse note:  Arrives via [facility] with c/o right testicular pain -- onset of symptoms 0100.  Patietn is AAOx3.  Skin warm and dry. NAD

## 2016-10-27 NOTE — ED Triage Notes (Signed)
Pt arrived via EMS from home with c/o right testicular pain and swelling. States sxs started around 0130 today. Pt has right nephrostomy tube in place. Pt reports no issues with swelling until this morning. States he normally goes to [facility], but EMS would not transport him there.

## 2016-10-27 NOTE — ED Provider Notes (Signed)
[facility] Emergency Department Provider Note  ____________________________________________   First MD Initiated Contact with Patient 10/27/16 1718     (approximate)  I have reviewed the triage vital signs and the nursing notes.   HISTORY  Chief Complaint Groin Swelling    HPI Larry Morrison is a 46 y.o. male who comes to the emergency department with severe aching intermittent right testicular pain that began at 1 AM today while lying in bed. The pain woke him from his sleep. He denies trauma. He says the pain is worse with movement and improved with rest. He has a complex past medical history including kidney stones with obstructive uropathy he is status post a left-sided nephrectomy and has recurrent right-sided ureteral strictures and has a right sided nephrostomy tube in addition to chronic kidney disease. He says he has never had pain in his testicle like this before. He denies hematuria. He denies dysuria.The patient artery has children and he does not want any more children.   Past Medical History:  Diagnosis Date  . Aneurysm ([place])   . Foot drop   . History of nephrostomy ([place])   . Hypertension   . Renal disorder   . Stroke [facility])     There are no active problems to display for this patient.   Past Surgical History:  Procedure Laterality Date  . NEPHRECTOMY    . NEPHRECTOMY      Prior to Admission medications   Medication Sig Start Date End Date Taking? Authorizing Provider  HYDROcodone-acetaminophen (NORCO/VICODIN) 5-325 MG per tablet Take 1 tablet by mouth every 6 (six) hours as needed for moderate pain. Patient not taking: Reported on 04/04/2015 01/03/15   Delman Kitten, MD  oxyCODONE-acetaminophen (ROXICET) 5-325 MG tablet Take 1 tablet by mouth every 6 (six) hours as needed. 04/04/15   Schuyler Amor, MD    Allergies Patient has no known allergies.  History reviewed. No pertinent family history.  Social History Social History    Substance Use Topics  . Smoking status: Current Some Day Smoker    Types: Cigarettes  . Smokeless tobacco: Never Used  . Alcohol use No    Review of Systems Constitutional: No fever/chills Eyes: No visual changes. ENT: No sore throat. Cardiovascular: Denies chest pain. Respiratory: Denies shortness of breath. Gastrointestinal: No abdominal pain.  No nausea, no vomiting.  No diarrhea.  No constipation. Genitourinary: Positive for testicular pain Musculoskeletal: Negative for back pain. Skin: Negative for rash. Neurological: Negative for headaches, focal weakness or numbness.   ____________________________________________   PHYSICAL EXAM:  VITAL SIGNS: ED Triage Vitals  Enc Vitals Group     BP 10/27/16 1603 (!) 142/88     Pulse Rate 10/27/16 1603 90     Resp 10/27/16 1603 18     Temp 10/27/16 1603 98.1 F (36.7 C)     Temp Source 10/27/16 1603 Oral     SpO2 10/27/16 1603 97 %     Weight 10/27/16 1604 105 lb (47.6 kg)     Height 10/27/16 1604 5\' 3"  (1.6 m)     Head Circumference --      Peak Flow --      Pain Score 10/27/16 1603 5     Pain Loc --      Pain Edu? --      Excl. in [place]? --     Constitutional: Alert and oriented x 4 Chronically ill appearing. Appears quite uncomfortable Eyes: PERRL EOMI. Head: Atraumatic. Nose: No congestion/rhinnorhea. Mouth/Throat:  No trismus Neck: No stridor.   Cardiovascular: Normal rate, regular rhythm. Grossly normal heart sounds.  Good peripheral circulation. Respiratory: Normal respiratory effort.  No retractions. Lungs CTAB and moving good air Gastrointestinal: Soft abdomen non-tender right sided nephrostomy tube in place Normal phallus right testicle swollen and a transverse lie with a negative cremasteric reflex normal cremasteric reflex on the left Musculoskeletal: No lower extremity edema   Neurologic:  Normal speech and language. No gross focal neurologic deficits are appreciated. Skin:  Skin is warm, dry and intact. No  rash noted. Psychiatric: Mood and affect are normal. Speech and behavior are normal.    __ ____________________________________________   LABS (all labs ordered are listed, but only abnormal results are displayed)  Labs Reviewed  BASIC METABOLIC PANEL - Abnormal; Notable for the following:       Result Value   CO2 21 (*)    BUN 35 (*)    Creatinine, Ser 2.93 (*)    GFR calc non Af Amer 24 (*)    GFR calc Af Amer 28 (*)    All other components within normal limits  URINALYSIS, COMPLETE (UACMP) WITH MICROSCOPIC  CBC    Evidence of chronic kidney disease no change __________________________________________  EKG  ______________________________________  RADIOLOGY  Ultrasound shows no blood flow in the right testicle ____________________________________________   PROCEDURES  Procedure(s) performed: no  Procedures  Critical Care performed: no  Observation: no ____________________________________________   INITIAL IMPRESSION / ASSESSMENT AND PLAN / ED COURSE  Pertinent labs & imaging results that were available during my care of the patient were reviewed by me and considered in my medical decision making (see chart for details).  By the time the patient was in my room he artery had an ultrasound showing no flow in his right testicle concerning for torsion. Unfortunately this is over 74 hours old at this point. I immediately contacted urology who will come take him to the operating room.     ----------------------------------------- 5:27 PM on 10/27/2016 -----------------------------------------  I discussed the case with Dr. Erlene Quan who is coming in now and alerting the OR. ____________________________________________   FINAL CLINICAL IMPRESSION(S) / ED DIAGNOSES  Final diagnoses:  Groin swelling      NEW MEDICATIONS STARTED DURING THIS VISIT:  New Prescriptions   No medications on file     Note:  This document was prepared using Dragon voice  recognition software and may include unintentional dictation errors.     Darel Hong, MD 10/27/16 [phone]

## 2016-10-27 NOTE — Transfer of Care (Signed)
Immediate Anesthesia Transfer of Care Note  Patient: Devontre Siedschlag  Procedure(s) Performed: Procedure(s): SCROTUM EXPLORATION (Bilateral) ORCHIOPEXY ADULT (Bilateral) PSB ORCHIECTOMY (Right)  Patient Location: PACU  Anesthesia Type:General  Level of Consciousness: sedated  Airway & Oxygen Therapy: Patient connected to face mask oxygen  Post-op Assessment: Report given to RN  Post vital signs: stable  Last Vitals:  Vitals:   10/27/16 1730 10/27/16 1948  BP:  120/75  Pulse:  70  Resp: 18 16  Temp:  37.2 C    Last Pain:  Vitals:   10/27/16 1948  TempSrc: Temporal  PainSc:          Complications: No apparent anesthesia complications

## 2016-10-27 NOTE — Anesthesia Post-op Follow-up Note (Cosign Needed)
Anesthesia QCDR form completed.        

## 2016-10-27 NOTE — Anesthesia Preprocedure Evaluation (Addendum)
Anesthesia Evaluation  Patient identified by MRN, date of birth, ID band Patient awake    Reviewed: Allergy & Precautions, H&P , NPO status , Patient's Chart, lab work & pertinent test results, reviewed documented beta blocker date and time   History of Anesthesia Complications Negative for: history of anesthetic complications  Airway Mallampati: II  TM Distance: >3 FB Neck ROM: full    Dental  (+) Poor Dentition, Dental Advidsory Given, Missing, Chipped   Pulmonary neg shortness of breath, neg COPD, neg recent URI, Current Smoker,           Cardiovascular Exercise Tolerance: Good hypertension, (-) CAD, (-) Past MI, (-) Cardiac Stents and (-) CABG Normal cardiovascular exam(-) dysrhythmias (-) Valvular Problems/Murmurs     Neuro/Psych neg Seizures S/p aneurysm resection CVA, Residual Symptoms negative psych ROS   GI/Hepatic negative GI ROS, Neg liver ROS,   Endo/Other  negative endocrine ROS  Renal/GU CRFRenal disease (s/p nephrectomy)  negative genitourinary   Musculoskeletal   Abdominal   Peds  Hematology negative hematology ROS (+)   Anesthesia Other Findings Past Medical History: No date: Aneurysm ([place]) No date: Foot drop No date: History of nephrostomy ([facility]) No date: Hypertension No date: Renal disorder No date: Stroke [facility])  Patient present with testicular torsion and needs an emergent scrotal exploration.  Plan for RSI.  Explained all risks.  Patient verbalized understanding and had no questions.  Reproductive/Obstetrics negative OB ROS                            Anesthesia Physical Anesthesia Plan  ASA: III and emergent  Anesthesia Plan: Cricoid Pressure, Rapid Sequence and General ETT   Post-op Pain Management:    Induction: Intravenous  PONV Risk Score and Plan: 2 and Ondansetron, Dexamethasone and Treatment may vary due to age or medical condition  Airway Management  Planned: Oral ETT  Additional Equipment:   Intra-op Plan:   Post-operative Plan: Extubation in OR  Informed Consent: I have reviewed the patients History and Physical, chart, labs and discussed the procedure including the risks, benefits and alternatives for the proposed anesthesia with the patient or authorized representative who has indicated his/her understanding and acceptance.   Dental Advisory Given and Dental advisory given  Plan Discussed with: Anesthesiologist, CRNA and Surgeon  Anesthesia Plan Comments:        Anesthesia Quick Evaluation

## 2016-10-27 NOTE — Op Note (Signed)
Date of procedure: 10/27/16  Preoperative diagnosis:  1. Right testicular torsion   Postoperative diagnosis:  1. Right testicular torsion   Procedure: 1. Scrotal exploration 2. Left orchidopexy 3. Right orchiectomy  Surgeon: Hollice Espy, MD  Anesthesia: General  Complications: None  Intraoperative findings: Necrotic enlarged right testicle with 540 twist consistent with testicular torsion. After detorsion, no demonstrable improved blood flow, Doppler pulse, or bleeding with incision consistent with end-stage necrosis, nonsalvageable. Left testicle mildly atrophic.  EBL: Minimal  Specimens: Right testicle  Drains: Minimal  Indication: Larry Morrison is a 46 y.o. patient with acute onset right testicular pain which woke him up from sleep at 1 AM. Doppler was consistent with right testicular torsion, absence of right testicular blood flow..  After reviewing the management options for treatment, he elected to proceed with the above surgical procedure(s). We have discussed the potential benefits and risks of the procedure, side effects of the proposed treatment, the likelihood of the patient achieving the goals of the procedure, and any potential problems that might occur during the procedure or recuperation. Informed consent has been obtained.  Description of procedure:  The patient was taken to the operating room and general anesthesia was induced.  The patient was placed in the supine position, prepped and draped in the usual sterile fashion, and preoperative antibiotics were administered. A preoperative time-out was performed.   A careful examination was performed which revealed an enlarged right firm testicle and epididymis which was slightly superiorly placed and had a lateral lie. Exam findings were consistent with torsion. The midline incision was instilled with 10 cc of half percent were came. The incision was then carried down using a 15 blade through the scrotal skin in the  midline raphae. The incision was carried through the dartos and subcutaneous tissues using Bovie electrocautery. The right testicle was delivered into the field. Upon opening the tunica vaginalis, clearish somewhat foul-smelling fluid was noted around the testicle. There was a putrid necrotic smell. The testicle appeared blue and enlarged. The cord was knotted with a 540 twist. Upon detorsion of the testicle back to its normal anatomic position, there was no evidence of reperfusion. The testicle was carefully wrapped in a warm saline towel and placed to the side. Attention was then turned to delivery of the left testicle into the field using both electrocautery. The testicle appeared white and normal. It is slightly atrophic in size. Orchidopexy was undertaken using 3 2-0 Prolene sutures through the tunica albuginea and anchoring it into 3 positions, medially, laterally, and inferiorly in its normal anatomic position with care taken to orient the lateral sulcus laterally. Once in satisfactory position, each of these ties were tied down. Attention was then turned back to the right testicle. After unwrapping the testicle, there was no evidence of improved blood flow or any blood flow to all. A Doppler ultrasound was placed on the testicle and there was no demonstrable flow. This is compared to the left testicle which flow was easily audible on Doppler. Next, an incision was made within the tunica albuginea and there is absolutely no bleeding noted. This is consistent with a completely necrotic nonsalvageable testicle. At this point in time, a simple orchiectomy was performed. 2 clamps are placed across the cord and the cord was divided into 2 packets and ligated using 0 silk 2. The right testicle was passed off the field. There is absolutely no bleeding noted. Careful hemostasis was achieved. The scrotum was irrigated using warm saline solution. The scrotum was  then closed using a 3-0 Vicryl to close the dartos in a  running fashion followed by interrupted 4-0 chromic sutures on the skin. The scrotal skin was also closed additionally with Dermabond. The patient was then cleaned and dried. Scrotal fluffs and a scrotal support and ice were applied. There complications. He was reversed from anesthesia and taken to the PACU. He will be discharged home.  Hollice Espy, M.D.

## 2016-10-27 NOTE — Anesthesia Procedure Notes (Signed)
Procedure Name: Intubation Date/Time: 10/27/2016 6:47 PM Performed by: Aline Brochure Pre-anesthesia Checklist: Patient identified, Patient being monitored, Timeout performed, Emergency Drugs available and Suction available Patient Re-evaluated:Patient Re-evaluated prior to inductionOxygen Delivery Method: Circle system utilized Preoxygenation: Pre-oxygenation with 100% oxygen Intubation Type: IV induction Laryngoscope Size: Mac and 3 Grade View: Grade II Tube type: Oral Tube size: 7.5 mm Number of attempts: 1 Airway Equipment and Method: Stylet Placement Confirmation: ETT inserted through vocal cords under direct vision,  positive ETCO2 and breath sounds checked- equal and bilateral Secured at: 23 cm Tube secured with: Tape Dental Injury: Teeth and Oropharynx as per pre-operative assessment  Difficulty Due To: Difficulty was anticipated and Difficult Airway- due to anterior larynx

## 2016-10-28 DIAGNOSIS — N4400 Torsion of testis, unspecified: Secondary | ICD-10-CM

## 2016-10-29 ENCOUNTER — Encounter: Payer: Self-pay | Admitting: Urology

## 2016-10-30 ENCOUNTER — Encounter: Payer: Self-pay | Admitting: Urology

## 2016-10-31 LAB — SURGICAL PATHOLOGY

## 2016-12-01 ENCOUNTER — Encounter: Payer: Self-pay | Admitting: Emergency Medicine

## 2016-12-01 ENCOUNTER — Emergency Department
Admission: EM | Admit: 2016-12-01 | Discharge: 2016-12-01 | Disposition: A | Discharge status: Home / Self Care | Payer: Medicaid Other | Attending: Emergency Medicine | Admitting: Emergency Medicine

## 2016-12-01 DIAGNOSIS — Y828 Other medical devices associated with adverse incidents: Secondary | ICD-10-CM | POA: Diagnosis not present

## 2016-12-01 DIAGNOSIS — Z79899 Other long term (current) drug therapy: Secondary | ICD-10-CM | POA: Insufficient documentation

## 2016-12-01 DIAGNOSIS — T83032A Leakage of nephrostomy catheter, initial encounter: Secondary | ICD-10-CM | POA: Diagnosis present

## 2016-12-01 DIAGNOSIS — F17210 Nicotine dependence, cigarettes, uncomplicated: Secondary | ICD-10-CM | POA: Diagnosis not present

## 2016-12-01 DIAGNOSIS — Z905 Acquired absence of kidney: Secondary | ICD-10-CM | POA: Insufficient documentation

## 2016-12-01 DIAGNOSIS — Z9079 Acquired absence of other genital organ(s): Secondary | ICD-10-CM | POA: Diagnosis not present

## 2016-12-01 NOTE — ED Triage Notes (Signed)
Pt has run out of urostomy bags and his urostomy bag is leaking. Pt lives alone and cares for self w/ assistance of home health.

## 2016-12-01 NOTE — ED Provider Notes (Signed)
[facility] Emergency Department Provider Note  ____________________________________________  Time seen: Approximately 11:28 PM  I have reviewed the triage vital signs and the nursing notes.   HISTORY  Chief Complaint urosotomy bag leakage    HPI Larry Morrison is a 46 y.o. male that presents to the emergency department with a leaking nephrostomy bag. Patient states that the bottom of the bag started leaking today. No additional complaints or concerns. He sees [facility] urology for nephrostomy. He denies shortness of breath, chest pain, nausea, vomiting, abdominal pain.   Past Medical History:  Diagnosis Date  . Aneurysm ([place])   . Foot drop   . History of nephrostomy ([place])   . Hypertension   . Renal disorder   . Stroke [facility])     Patient Active Problem List   Diagnosis Date Noted  . Testicular torsion     Past Surgical History:  Procedure Laterality Date  . NEPHRECTOMY    . NEPHRECTOMY    . ORCHIECTOMY Right 10/27/2016   Procedure: PSB ORCHIECTOMY;  Surgeon: Hollice Espy, MD;  Location: [facility] ORS;  Service: Urology;  Laterality: Right;  . ORCHIOPEXY Bilateral 10/27/2016   Procedure: ORCHIOPEXY ADULT;  Surgeon: Hollice Espy, MD;  Location: [facility] ORS;  Service: Urology;  Laterality: Bilateral;  . SCROTAL EXPLORATION Bilateral 10/27/2016   Procedure: SCROTUM EXPLORATION;  Surgeon: Hollice Espy, MD;  Location: [facility] ORS;  Service: Urology;  Laterality: Bilateral;    Prior to Admission medications   Medication Sig Start Date End Date Taking? Authorizing Provider  docusate sodium (COLACE) 100 MG capsule Take 1 capsule (100 mg total) by mouth 2 (two) times daily. 10/27/16   Hollice Espy, MD  HYDROcodone-acetaminophen (NORCO/VICODIN) 5-325 MG per tablet Take 1 tablet by mouth every 6 (six) hours as needed for moderate pain. Patient not taking: Reported on 04/04/2015 01/03/15   Delman Kitten, MD  oxyCODONE-acetaminophen (ROXICET) 5-325 MG tablet Take 1-2 tablets  by mouth every 6 (six) hours as needed. 10/27/16   Hollice Espy, MD  sodium chloride 0.9 % injection Inject 10 mLs into the vein daily.  10/11/16 01/09/17  [provider]    Allergies Patient has no known allergies.  History reviewed. No pertinent family history.  Social History Social History  Substance Use Topics  . Smoking status: Current Some Day Smoker    Packs/day: 0.50    Types: Cigarettes  . Smokeless tobacco: Never Used  . Alcohol use No     Review of Systems  Constitutional: No fever/chills Cardiovascular: No chest pain. Respiratory: No SOB. Gastrointestinal: No abdominal pain.  No nausea, no vomiting.  Skin: Negative for rash, abrasions, lacerations, ecchymosis. Neurological: Negative for headaches   ____________________________________________   PHYSICAL EXAM:  VITAL SIGNS: ED Triage Vitals [12/01/16 1953]  Enc Vitals Group     BP 127/88     Pulse Rate 81     Resp 16     Temp 97.9 F (36.6 C)     Temp Source Oral     SpO2 100 %     Weight 105 lb (47.6 kg)     Height 5\' 4"  (1.626 m)     Head Circumference      Peak Flow      Pain Score      Pain Loc      Pain Edu?      Excl. in [place]?      Constitutional: Alert and oriented. Well appearing and in no acute distress. Eyes: Conjunctivae are normal. PERRL. EOMI.  Head: Atraumatic. ENT:      Ears:      Nose: No congestion/rhinnorhea.      Mouth/Throat: Mucous membranes are moist.  Neck: No stridor.   Cardiovascular: Normal rate, regular rhythm.  Good peripheral circulation. Respiratory: Normal respiratory effort without tachypnea or retractions. Lungs CTAB. Good air entry to the bases with no decreased or absent breath sounds. Genitourinary: Nephrostomy bag leaking from bottom. No drainage or erythema around the ostomy site. Musculoskeletal: Full range of motion to all extremities. No gross deformities appreciated. Neurologic:  Normal speech and language. No gross focal neurologic deficits  are appreciated.  Skin:  Skin is warm, dry and intact. No rash noted.   ____________________________________________   LABS (all labs ordered are listed, but only abnormal results are displayed)  Labs Reviewed - No data to display ____________________________________________  EKG   ____________________________________________  RADIOLOGY   No results found.  ____________________________________________    PROCEDURES  Procedure(s) performed:    Procedures    Medications - No data to display   ____________________________________________   INITIAL IMPRESSION / ASSESSMENT AND PLAN / ED COURSE  Pertinent labs & imaging results that were available during my care of the patient were reviewed by me and considered in my medical decision making (see chart for details).  Review of the [place] CSRS was performed in accordance of the [place] prior to dispensing any controlled drugs.   Patient's diagnosis is consistent with leaking nephrostomy bag. Vital signs and exam are reassuring. Bag was changed. Patient will follow-up with [facility] urology. Patient is given ED precautions to return to the ED for any worsening or new symptoms.     ____________________________________________  FINAL CLINICAL IMPRESSION(S) / ED DIAGNOSES  Final diagnoses:  Leakage of nephrostomy catheter, initial encounter [facility])      NEW MEDICATIONS STARTED DURING THIS VISIT:  Discharge Medication List as of 12/01/2016  9:06 PM          This chart was dictated using voice recognition software/Dragon. Despite best efforts to proofread, errors can occur which can change the meaning. Any change was purely unintentional.    Laban Emperor, PA-C 12/01/16 [place], [place], MD 12/05/16 [phone]

## 2016-12-01 NOTE — ED Triage Notes (Signed)
Pt to ED via EMS from home c/o urinary bag leaking this morning and does not have any replacement bags.  Denies pain or other symptoms.

## 2017-02-01 ENCOUNTER — Emergency Department
Admission: EM | Admit: 2017-02-01 | Discharge: 2017-02-01 | Disposition: A | Discharge status: Home / Self Care | Payer: Medicaid Other | Attending: Student in an Organized Health Care Education/Training Program | Admitting: Student in an Organized Health Care Education/Training Program

## 2017-02-01 DIAGNOSIS — R8299 Other abnormal findings in urine: Secondary | ICD-10-CM | POA: Diagnosis present

## 2017-02-01 DIAGNOSIS — Z936 Other artificial openings of urinary tract status: Secondary | ICD-10-CM | POA: Insufficient documentation

## 2017-02-01 DIAGNOSIS — F17210 Nicotine dependence, cigarettes, uncomplicated: Secondary | ICD-10-CM | POA: Insufficient documentation

## 2017-02-01 DIAGNOSIS — T83511A Infection and inflammatory reaction due to indwelling urethral catheter, initial encounter: Secondary | ICD-10-CM | POA: Insufficient documentation

## 2017-02-01 DIAGNOSIS — Y69 Unspecified misadventure during surgical and medical care: Secondary | ICD-10-CM | POA: Insufficient documentation

## 2017-02-01 DIAGNOSIS — Z8673 Personal history of transient ischemic attack (TIA), and cerebral infarction without residual deficits: Secondary | ICD-10-CM | POA: Insufficient documentation

## 2017-02-01 DIAGNOSIS — N390 Urinary tract infection, site not specified: Secondary | ICD-10-CM

## 2017-02-01 DIAGNOSIS — I10 Essential (primary) hypertension: Secondary | ICD-10-CM | POA: Diagnosis not present

## 2017-02-01 DIAGNOSIS — T83518A Infection and inflammatory reaction due to other urinary catheter, initial encounter: Secondary | ICD-10-CM

## 2017-02-01 LAB — URINALYSIS, COMPLETE (UACMP) WITH MICROSCOPIC
BILIRUBIN URINE: NEGATIVE
BILIRUBIN URINE: NEGATIVE
Glucose, UA: NEGATIVE mg/dL
Glucose, UA: NEGATIVE mg/dL
Ketones, ur: 15 mg/dL — AB
Ketones, ur: NEGATIVE mg/dL
NITRITE: NEGATIVE
Nitrite: NEGATIVE
Protein, ur: >300 mg/dL — AB
RBC / HPF: NONE SEEN RBC/hpf (ref 0–5)
SPECIFIC GRAVITY, URINE: 1.02 (ref 1.005–1.030)
Specific Gravity, Urine: 1.01 (ref 1.005–1.030)
Squamous Epithelial / LPF: NONE SEEN
pH: 7.5 (ref 5.0–8.0)
pH: >9 — ABNORMAL HIGH (ref 5.0–8.0)

## 2017-02-01 LAB — COMPREHENSIVE METABOLIC PANEL
ALT: 26 U/L (ref 17–63)
AST: 32 U/L (ref 15–41)
Albumin: 3.3 g/dL — ABNORMAL LOW (ref 3.5–5.0)
Alkaline Phosphatase: 51 U/L (ref 38–126)
Anion gap: 7 (ref 5–15)
BUN: 34 mg/dL — AB (ref 6–20)
CHLORIDE: 110 mmol/L (ref 101–111)
CO2: 23 mmol/L (ref 22–32)
CREATININE: 3.53 mg/dL — AB (ref 0.61–1.24)
Calcium: 8.7 mg/dL — ABNORMAL LOW (ref 8.9–10.3)
GFR calc Af Amer: 22 mL/min — ABNORMAL LOW (ref >60)
GFR calc non Af Amer: 19 mL/min — ABNORMAL LOW (ref >60)
GLUCOSE: 84 mg/dL (ref 65–99)
Potassium: 5.1 mmol/L (ref 3.5–5.1)
SODIUM: 140 mmol/L (ref 135–145)
Total Bilirubin: 0.5 mg/dL (ref 0.3–1.2)
Total Protein: 7.7 g/dL (ref 6.5–8.1)

## 2017-02-01 LAB — CBC WITH DIFFERENTIAL/PLATELET
BASOS ABS: 0.1 10*3/uL (ref 0–0.1)
Basophils Relative: 1 %
EOS ABS: 0.3 10*3/uL (ref 0–0.7)
EOS PCT: 3 %
HCT: 36.4 % — ABNORMAL LOW (ref 40.0–52.0)
Hemoglobin: 12.6 g/dL — ABNORMAL LOW (ref 13.0–18.0)
LYMPHS PCT: 22 %
Lymphs Abs: 1.9 10*3/uL (ref 1.0–3.6)
MCH: 31.9 pg (ref 26.0–34.0)
MCHC: 34.5 g/dL (ref 32.0–36.0)
MCV: 92.3 fL (ref 80.0–100.0)
Monocytes Absolute: 0.5 10*3/uL (ref 0.2–1.0)
Monocytes Relative: 6 %
NEUTROS PCT: 68 %
Neutro Abs: 5.6 10*3/uL (ref 1.4–6.5)
PLATELETS: 225 10*3/uL (ref 150–440)
RBC: 3.94 MIL/uL — AB (ref 4.40–5.90)
RDW: 13.7 % (ref 11.5–14.5)
WBC: 8.4 10*3/uL (ref 3.8–10.6)

## 2017-02-01 MED ORDER — DEXTROSE 5 % IV SOLN: 1.0000 g | Freq: Once | INTRAVENOUS | Status: DC

## 2017-02-01 MED ORDER — CEPHALEXIN 250 MG PO CAPS: 250.0000 mg | ORAL_CAPSULE | Freq: Two times a day (BID) | ORAL | 0 refills | Status: AC

## 2017-02-01 MED ORDER — CEFTRIAXONE SODIUM 1 G IJ SOLR
1.0000 g | Freq: Once | INTRAMUSCULAR | Status: AC
  Administered 2017-02-01: 1 g via INTRAVENOUS
  Filled 2017-02-01: qty 10

## 2017-02-01 NOTE — ED Notes (Signed)
Patient was given an ED sandwich tray and ginger ale per his request and with Dr. Ruffin Frederick approval.

## 2017-02-01 NOTE — ED Notes (Signed)
Pt arrived with dressing off nephrostomy.  Cleaned with sterile technique. New sterile dressing applied.

## 2017-02-01 NOTE — ED Triage Notes (Signed)
Pt came in via [facility] with c/o change in color to urine from nephrostomy.  Pt noted to have "eggplant" purple urine in his bag.  Pt states this has happened to him in the past and it is usually because of an infection.  Pt A&Ox4 at this time and in NAD.

## 2017-02-01 NOTE — Discharge Instructions (Signed)
Follow up with PCP and urology.  Return for worsening fevers, pain, decreased urine output.

## 2017-02-01 NOTE — ED Provider Notes (Signed)
[facility] Emergency Department Provider Note    None    (approximate)  I have reviewed the triage vital signs and the nursing notes.   HISTORY  Chief Complaint Other (discolored urine)    HPI Larry Morrison is a 46 y.o. male with a history of difficulty with nephrostomy tubes as well as aneurysms stroke who gives majority of his healthcare at [facility] presents with abnormal purple colored urine from his nephrostomy tube. States he's had episodes of this in the past related to infection. Denies any fevers but has had nausea. Was post actually have his nephrostomy tubes as exchanged yesterday but forgot about it did not go to his appointment. Denies any other pain. No shortness of breath or chest pain.   Past Medical History:  Diagnosis Date  . Aneurysm ([place])   . Foot drop   . History of nephrostomy ([place])   . Hypertension   . Renal disorder   . Stroke [facility])    No family history on file. Past Surgical History:  Procedure Laterality Date  . NEPHRECTOMY    . NEPHRECTOMY    . ORCHIECTOMY Right 10/27/2016   Procedure: PSB ORCHIECTOMY;  Surgeon: Hollice Espy, MD;  Location: [facility] ORS;  Service: Urology;  Laterality: Right;  . ORCHIOPEXY Bilateral 10/27/2016   Procedure: ORCHIOPEXY ADULT;  Surgeon: Hollice Espy, MD;  Location: [facility] ORS;  Service: Urology;  Laterality: Bilateral;  . SCROTAL EXPLORATION Bilateral 10/27/2016   Procedure: SCROTUM EXPLORATION;  Surgeon: Hollice Espy, MD;  Location: [facility] ORS;  Service: Urology;  Laterality: Bilateral;   Patient Active Problem List   Diagnosis Date Noted  . Testicular torsion       Prior to Admission medications   Medication Sig Start Date End Date Taking? Authorizing Provider  cephALEXin (KEFLEX) 250 MG capsule Take 1 capsule (250 mg total) by mouth 2 (two) times daily. 02/01/17 02/08/17  Merlyn Lot, MD  docusate sodium (COLACE) 100 MG capsule Take 1 capsule (100 mg total) by mouth 2 (two)  times daily. Patient not taking: Reported on 02/01/2017 10/27/16   Hollice Espy, MD  HYDROcodone-acetaminophen (NORCO/VICODIN) 5-325 MG per tablet Take 1 tablet by mouth every 6 (six) hours as needed for moderate pain. Patient not taking: Reported on 02/01/2017 01/03/15   Delman Kitten, MD  oxyCODONE-acetaminophen (ROXICET) 5-325 MG tablet Take 1-2 tablets by mouth every 6 (six) hours as needed. Patient not taking: Reported on 02/01/2017 10/27/16   Hollice Espy, MD    Allergies Patient has no known allergies.    Social History Social History  Substance Use Topics  . Smoking status: Current Some Day Smoker    Packs/day: 0.50    Types: Cigarettes  . Smokeless tobacco: Never Used  . Alcohol use No    Review of Systems Patient denies headaches, rhinorrhea, blurry vision, numbness, shortness of breath, chest pain, edema, cough, abdominal pain, nausea, vomiting, diarrhea, dysuria, fevers, rashes or hallucinations unless otherwise stated above in HPI. ____________________________________________   PHYSICAL EXAM:  VITAL SIGNS: Vitals:   02/01/17 1054 02/01/17 1107  BP: 121/86 (!) 154/89  Pulse: 80 89  Resp: 17 18  Temp:    SpO2: 100% 100%    Constitutional: Alert and oriented. Well appearing and in no acute distress. Eyes: Conjunctivae are normal.  Head: Atraumatic. Nose: No congestion/rhinnorhea. Mouth/Throat: Mucous membranes are moist.   Neck: No stridor. Painless ROM.  Cardiovascular: Normal rate, regular rhythm. Grossly normal heart sounds.  Good peripheral circulation. Respiratory: Normal respiratory effort.  No retractions. Lungs CTAB. Gastrointestinal: Soft and nontender. No distention. No abdominal bruits. Right nephrostomy tube in place, adequately draining with bright purple urine in bag Genitourinary: no discharge from penis Musculoskeletal: No lower extremity tenderness nor edema.  No joint effusions. Neurologic:  MAE, no facial droop, no groos deficits  appeciated Skin:  Skin is warm, dry and intact. No rash noted. Psychiatric: Mood and affect are normal. Speech and behavior are normal.  ____________________________________________   LABS (all labs ordered are listed, but only abnormal results are displayed)  Results for orders placed or performed during the hospital encounter of 02/01/17 (from the past 24 hour(s))  Urinalysis, Complete w Microscopic     Status: Abnormal   Collection Time: 02/01/17  9:48 AM  Result Value Ref Range   Color, Urine YELLOW YELLOW   APPearance CLOUDY (A) CLEAR   Specific Gravity, Urine 1.010 1.005 - 1.030   pH >9.0 (H) 5.0 - 8.0   Glucose, UA NEGATIVE NEGATIVE mg/dL   Hgb urine dipstick SMALL (A) NEGATIVE   Bilirubin Urine NEGATIVE NEGATIVE   Ketones, ur NEGATIVE NEGATIVE mg/dL   Protein, ur >300 (A) NEGATIVE mg/dL   Nitrite NEGATIVE NEGATIVE   Leukocytes, UA MODERATE (A) NEGATIVE   Squamous Epithelial / LPF 0-5 (A) NONE SEEN   WBC, UA 0-5 0 - 5 WBC/hpf   RBC / HPF NONE SEEN 0 - 5 RBC/hpf   Bacteria, UA MANY (A) NONE SEEN   Triple Phosphate [name] PRESENT   CBC with Differential/Platelet     Status: Abnormal   Collection Time: 02/01/17  9:48 AM  Result Value Ref Range   WBC 8.4 3.8 - 10.6 K/uL   RBC 3.94 (L) 4.40 - 5.90 MIL/uL   Hemoglobin 12.6 (L) 13.0 - 18.0 g/dL   HCT 36.4 (L) 40.0 - 52.0 %   MCV 92.3 80.0 - 100.0 fL   MCH 31.9 26.0 - 34.0 pg   MCHC 34.5 32.0 - 36.0 g/dL   RDW 13.7 11.5 - 14.5 %   Platelets 225 150 - 440 K/uL   Neutrophils Relative % 68 %   Neutro Abs 5.6 1.4 - 6.5 K/uL   Lymphocytes Relative 22 %   Lymphs Abs 1.9 1.0 - 3.6 K/uL   Monocytes Relative 6 %   Monocytes Absolute 0.5 0.2 - 1.0 K/uL   Eosinophils Relative 3 %   Eosinophils Absolute 0.3 0 - 0.7 K/uL   Basophils Relative 1 %   Basophils Absolute 0.1 0 - 0.1 K/uL  Comprehensive metabolic panel     Status: Abnormal   Collection Time: 02/01/17 10:10 AM  Result Value Ref Range   Sodium 140 135 - 145 mmol/L    Potassium 5.1 3.5 - 5.1 mmol/L   Chloride 110 101 - 111 mmol/L   CO2 23 22 - 32 mmol/L   Glucose, Bld 84 65 - 99 mg/dL   BUN 34 (H) 6 - 20 mg/dL   Creatinine, Ser 3.53 (H) 0.61 - 1.24 mg/dL   Calcium 8.7 (L) 8.9 - 10.3 mg/dL   Total Protein 7.7 6.5 - 8.1 g/dL   Albumin 3.3 (L) 3.5 - 5.0 g/dL   AST 32 15 - 41 U/L   ALT 26 17 - 63 U/L   Alkaline Phosphatase 51 38 - 126 U/L   Total Bilirubin 0.5 0.3 - 1.2 mg/dL   GFR calc non Af Amer 19 (L) >60 mL/min   GFR calc Af Amer 22 (L) >60 mL/min   Anion gap 7 5 -  15  Urinalysis, Complete w Microscopic     Status: Abnormal   Collection Time: 02/01/17 10:10 AM  Result Value Ref Range   Color, Urine YELLOW YELLOW   APPearance CLOUDY (A) CLEAR   Specific Gravity, Urine 1.020 1.005 - 1.030   pH 7.5 5.0 - 8.0   Glucose, UA NEGATIVE NEGATIVE mg/dL   Hgb urine dipstick MODERATE (A) NEGATIVE   Bilirubin Urine NEGATIVE NEGATIVE   Ketones, ur 15 (A) NEGATIVE mg/dL   Protein, ur >300 (A) NEGATIVE mg/dL   Nitrite NEGATIVE NEGATIVE   Leukocytes, UA MODERATE (A) NEGATIVE   Squamous Epithelial / LPF NONE SEEN NONE SEEN   WBC, UA TOO NUMEROUS TO COUNT 0 - 5 WBC/hpf   RBC / HPF 6-30 0 - 5 RBC/hpf   Bacteria, UA FEW (A) NONE SEEN   ____________________________________________  ____________________________________________  RADIOLOGY   ____________________________________________   PROCEDURES  Procedure(s) performed:  Procedures    Critical Care performed: no ____________________________________________   INITIAL IMPRESSION / ASSESSMENT AND PLAN / ED COURSE  Pertinent labs & imaging results that were available during my care of the patient were reviewed by me and considered in my medical decision making (see chart for details).  DDX: nephrostomy displacement, obstruction, stone, infection  Finley Chevez is a 46 y.o. who presents to the ED with propyl colored urine and his nephrostomy tube. Patient afebrile and otherwise he will  dynamically stable. Blood work ordered for the above differential shows chronic kidney disease with no metabolic derangements. He has no leukocytosis or sign of infectious process. Urine does appear infected which would be consistent with purple back syndrome. We'll touch base with his urologist.  Clinical Course as of Feb 02 1152  Thu Feb 01, 2017  1119 spoke with urology at [facility] Dr. Marylin Crosby who agrees it is the patient's nephrostomy tube is currently draining and there is no indication for urgent exchange. 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     Note:  This document was prepared using Dragon voice recognition software and may include unintentional dictation errors.    Merlyn Lot, MD 02/01/17 1155

## 2017-02-01 NOTE — ED Notes (Signed)
Pt informed cannot drink per MD until test results back.

## 2017-04-28 ENCOUNTER — Other Ambulatory Visit: Payer: Self-pay

## 2017-04-28 ENCOUNTER — Emergency Department
Admission: EM | Admit: 2017-04-28 | Discharge: 2017-04-28 | Disposition: A | Discharge status: Home / Self Care | Payer: Medicaid Other | Attending: Emergency Medicine | Admitting: Emergency Medicine

## 2017-04-28 ENCOUNTER — Encounter: Payer: Self-pay | Admitting: Emergency Medicine

## 2017-04-28 ENCOUNTER — Emergency Department: Payer: Medicaid Other

## 2017-04-28 DIAGNOSIS — N390 Urinary tract infection, site not specified: Secondary | ICD-10-CM | POA: Diagnosis not present

## 2017-04-28 DIAGNOSIS — Y829 Unspecified medical devices associated with adverse incidents: Secondary | ICD-10-CM | POA: Insufficient documentation

## 2017-04-28 DIAGNOSIS — R1031 Right lower quadrant pain: Secondary | ICD-10-CM | POA: Diagnosis present

## 2017-04-28 DIAGNOSIS — T83512A Infection and inflammatory reaction due to nephrostomy catheter, initial encounter: Secondary | ICD-10-CM | POA: Insufficient documentation

## 2017-04-28 DIAGNOSIS — I10 Essential (primary) hypertension: Secondary | ICD-10-CM | POA: Diagnosis not present

## 2017-04-28 DIAGNOSIS — T83092A Other mechanical complication of nephrostomy catheter, initial encounter: Secondary | ICD-10-CM | POA: Diagnosis not present

## 2017-04-28 DIAGNOSIS — F17210 Nicotine dependence, cigarettes, uncomplicated: Secondary | ICD-10-CM | POA: Insufficient documentation

## 2017-04-28 LAB — COMPREHENSIVE METABOLIC PANEL
ALT: 15 U/L — AB (ref 17–63)
AST: 24 U/L (ref 15–41)
Albumin: 3.5 g/dL (ref 3.5–5.0)
Alkaline Phosphatase: 51 U/L (ref 38–126)
Anion gap: 7 (ref 5–15)
BILIRUBIN TOTAL: 0.5 mg/dL (ref 0.3–1.2)
BUN: 37 mg/dL — AB (ref 6–20)
CO2: 24 mmol/L (ref 22–32)
CREATININE: 3.26 mg/dL — AB (ref 0.61–1.24)
Calcium: 9.4 mg/dL (ref 8.9–10.3)
Chloride: 108 mmol/L (ref 101–111)
GFR, EST AFRICAN AMERICAN: 24 mL/min — AB (ref >60)
GFR, EST NON AFRICAN AMERICAN: 21 mL/min — AB (ref >60)
Glucose, Bld: 89 mg/dL (ref 65–99)
POTASSIUM: 4.4 mmol/L (ref 3.5–5.1)
Sodium: 139 mmol/L (ref 135–145)
TOTAL PROTEIN: 8 g/dL (ref 6.5–8.1)

## 2017-04-28 LAB — URINALYSIS, COMPLETE (UACMP) WITH MICROSCOPIC
Bacteria, UA: NONE SEEN
Bilirubin Urine: NEGATIVE
GLUCOSE, UA: NEGATIVE mg/dL
Ketones, ur: NEGATIVE mg/dL
NITRITE: POSITIVE — AB
PH: 7 (ref 5.0–8.0)
PROTEIN: 100 mg/dL — AB
SPECIFIC GRAVITY, URINE: 1.011 (ref 1.005–1.030)
Squamous Epithelial / LPF: NONE SEEN

## 2017-04-28 LAB — CBC WITH DIFFERENTIAL/PLATELET
BASOS ABS: 0.1 10*3/uL (ref 0–0.1)
Basophils Relative: 2 %
Eosinophils Absolute: 0.3 10*3/uL (ref 0–0.7)
Eosinophils Relative: 6 %
HEMATOCRIT: 35.1 % — AB (ref 40.0–52.0)
Hemoglobin: 11.8 g/dL — ABNORMAL LOW (ref 13.0–18.0)
LYMPHS ABS: 1.9 10*3/uL (ref 1.0–3.6)
LYMPHS PCT: 44 %
MCH: 31.4 pg (ref 26.0–34.0)
MCHC: 33.6 g/dL (ref 32.0–36.0)
MCV: 93.3 fL (ref 80.0–100.0)
MONO ABS: 0.5 10*3/uL (ref 0.2–1.0)
MONOS PCT: 12 %
NEUTROS ABS: 1.6 10*3/uL (ref 1.4–6.5)
Neutrophils Relative %: 36 %
Platelets: 205 10*3/uL (ref 150–440)
RBC: 3.77 MIL/uL — ABNORMAL LOW (ref 4.40–5.90)
RDW: 13.2 % (ref 11.5–14.5)
WBC: 4.3 10*3/uL (ref 3.8–10.6)

## 2017-04-28 LAB — LACTIC ACID, PLASMA: LACTIC ACID, VENOUS: 0.9 mmol/L (ref 0.5–1.9)

## 2017-04-28 MED ORDER — SULFAMETHOXAZOLE-TRIMETHOPRIM 800-160 MG PO TABS: 1.0000 | ORAL_TABLET | Freq: Two times a day (BID) | ORAL | 0 refills | Status: AC

## 2017-04-28 MED ORDER — VANCOMYCIN HCL IN DEXTROSE 1-5 GM/200ML-% IV SOLN
1000.0000 mg | Freq: Once | INTRAVENOUS | Status: AC
  Administered 2017-04-28: 1000 mg via INTRAVENOUS
  Filled 2017-04-28: qty 200

## 2017-04-28 MED ORDER — CEPHALEXIN 500 MG PO CAPS: 500.0000 mg | ORAL_CAPSULE | Freq: Three times a day (TID) | ORAL | 0 refills | Status: AC

## 2017-04-28 MED ORDER — SODIUM CHLORIDE 0.9 % IV BOLUS (SEPSIS)
1000.0000 mL | Freq: Once | INTRAVENOUS | Status: AC
  Administered 2017-04-28: 1000 mL via INTRAVENOUS

## 2017-04-28 MED ORDER — DEXTROSE 5 % IV SOLN
1.0000 g | Freq: Once | INTRAVENOUS | Status: AC
  Administered 2017-04-28: 1 g via INTRAVENOUS
  Filled 2017-04-28: qty 1

## 2017-04-28 NOTE — ED Provider Notes (Signed)
[facility] Emergency Department Provider Note  ____________________________________________  Time seen: Approximately 1:02 PM  I have reviewed the triage vital signs and the nursing notes.   HISTORY  Chief Complaint Dysuria   HPI Larry Morrison is a 46 y.o. male with a past medical history of left nephrectomy, chronic right renal obstruction and a right nephrostomy tube who presents for evaluation of foul smelling urine. Patient reports that he has had UOP through the nephrostomy tube about also through his penis over the last few days. In the past when this happens and means that he needs to have his nephrostomy tube changed. Tube was last changed 5 months ago at [facility] which is where patient is followed for this. He was suppose to have it changed last week but he did not have a ride to [facility]. Today he comes in because the urine is foul smelling. Patient is complaining of moderate pain in his right flank which is constant and he has had for several years. The pain is not worse. No nausea, no vomiting, no fever, no chills.    Past Medical History:  Diagnosis Date  . Aneurysm ([place])   . Foot drop   . History of nephrostomy ([place])   . Hypertension   . Renal disorder   . Stroke [facility])     Patient Active Problem List   Diagnosis Date Noted  . Testicular torsion     Past Surgical History:  Procedure Laterality Date  . NEPHRECTOMY    . NEPHRECTOMY    . ORCHIECTOMY Right 10/27/2016   Procedure: PSB ORCHIECTOMY;  Surgeon: Hollice Espy, MD;  Location: [facility] ORS;  Service: Urology;  Laterality: Right;  . ORCHIOPEXY Bilateral 10/27/2016   Procedure: ORCHIOPEXY ADULT;  Surgeon: Hollice Espy, MD;  Location: [facility] ORS;  Service: Urology;  Laterality: Bilateral;  . SCROTAL EXPLORATION Bilateral 10/27/2016   Procedure: SCROTUM EXPLORATION;  Surgeon: Hollice Espy, MD;  Location: [facility] ORS;  Service: Urology;  Laterality: Bilateral;    Prior to Admission medications     Medication Sig Start Date End Date Taking? Authorizing Provider  cephALEXin (KEFLEX) 500 MG capsule Take 1 capsule (500 mg total) by mouth 3 (three) times daily for 10 days. 04/28/17 05/08/17  Rudene Re, MD  docusate sodium (COLACE) 100 MG capsule Take 1 capsule (100 mg total) by mouth 2 (two) times daily. Patient not taking: Reported on 02/01/2017 10/27/16   Hollice Espy, MD  HYDROcodone-acetaminophen (NORCO/VICODIN) 5-325 MG per tablet Take 1 tablet by mouth every 6 (six) hours as needed for moderate pain. Patient not taking: Reported on 02/01/2017 01/03/15   Delman Kitten, MD  oxyCODONE-acetaminophen (ROXICET) 5-325 MG tablet Take 1-2 tablets by mouth every 6 (six) hours as needed. Patient not taking: Reported on 02/01/2017 10/27/16   Hollice Espy, MD  sulfamethoxazole-trimethoprim (BACTRIM DS,SEPTRA DS) 800-160 MG tablet Take 1 tablet by mouth 2 (two) times daily for 10 days. 04/28/17 05/08/17  Rudene Re, MD    Allergies Patient has no known allergies.  No family history on file.  Social History Social History   Tobacco Use  . Smoking status: Current Some Day Smoker    Packs/day: 0.50    Types: Cigarettes  . Smokeless tobacco: Never Used  Substance Use Topics  . Alcohol use: No  . Drug use: Yes    Types: Marijuana    Review of Systems  Constitutional: Negative for fever. Eyes: Negative for visual changes. ENT: Negative for sore throat. Neck: No neck pain  Cardiovascular:  Negative for chest pain. Respiratory: Negative for shortness of breath. Gastrointestinal: Negative for abdominal pain, vomiting or diarrhea. Genitourinary: Negative for dysuria. + foul smelling urine Musculoskeletal: Negative for back pain. Skin: Negative for rash. Neurological: Negative for headaches, weakness or numbness. Psych: No SI or HI  ____________________________________________   PHYSICAL EXAM:  VITAL SIGNS: ED Triage Vitals  Enc Vitals Group     BP 04/28/17 1019 (!)  150/89     Pulse Rate 04/28/17 1019 61     Resp 04/28/17 1019 18     Temp 04/28/17 1019 97.9 F (36.6 C)     Temp Source 04/28/17 1019 Oral     SpO2 04/28/17 1019 100 %     Weight 04/28/17 1028 109 lb (49.4 kg)     Height 04/28/17 1028 5\' 4"  (1.626 m)     Head Circumference --      Peak Flow --      Pain Score 04/28/17 1028 0     Pain Loc --      Pain Edu? --      Excl. in [place]? --     Constitutional: Alert and oriented. Well appearing and in no apparent distress. HEENT:      Head: Normocephalic and atraumatic.         Eyes: Conjunctivae are normal. Sclera is non-icteric.       Mouth/Throat: Mucous membranes are moist.       Neck: Supple with no signs of meningismus. Cardiovascular: Normal sinus rhythm. No murmurs, gallops, or rubs. 2+ symmetrical distal pulses are present in all extremities. No JVD. Respiratory: Normal respiratory effort. Lungs are clear to auscultation bilaterally. No wheezes, crackles, or rhonchi.  Gastrointestinal: Soft, non tender, and non distended with positive bowel sounds. No rebound or guarding. Genitourinary: No CVA tenderness. R nephrostomy tube in place, flushes easily both towards the body and towards the bag Musculoskeletal: Nontender with normal range of motion in all extremities. No edema, cyanosis, or erythema of extremities. Neurologic: Normal speech and language. Face is symmetric. Moving all extremities. No gross focal neurologic deficits are appreciated. Skin: Skin is warm, dry and intact. No rash noted. Psychiatric: Mood and affect are normal. Speech and behavior are normal.  ____________________________________________   LABS (all labs ordered are listed, but only abnormal results are displayed)  Labs Reviewed  URINALYSIS, COMPLETE (UACMP) WITH MICROSCOPIC - Abnormal; Notable for the following components:      Result Value   Color, Urine AMBER (*)    APPearance TURBID (*)    Hgb urine dipstick MODERATE (*)    Protein, ur 100 (*)     Nitrite POSITIVE (*)    Leukocytes, UA MODERATE (*)    All other components within normal limits  CBC WITH DIFFERENTIAL/PLATELET - Abnormal; Notable for the following components:   RBC 3.77 (*)    Hemoglobin 11.8 (*)    HCT 35.1 (*)    All other components within normal limits  COMPREHENSIVE METABOLIC PANEL - Abnormal; Notable for the following components:   BUN 37 (*)    Creatinine, Ser 3.26 (*)    ALT 15 (*)    GFR calc non Af Amer 21 (*)    GFR calc Af Amer 24 (*)    All other components within normal limits  CULTURE, BLOOD (ROUTINE X 2)  CULTURE, BLOOD (ROUTINE X 2)  LACTIC ACID, PLASMA   ____________________________________________  EKG  none ____________________________________________  RADIOLOGY  Renal US: Ostomy tube suboptimally visualized but of a short  segment is seen at the lower pole collecting system.  Moderate RIGHT hydronephrosis with visualization of 2 nonobstructing calculi.  Medical renal disease changes RIGHT kidney.  Post LEFT nephrectomy.  ____________________________________________   PROCEDURES  Procedure(s) performed: None Procedures Critical Care performed:  None ____________________________________________   INITIAL IMPRESSION / ASSESSMENT AND PLAN / ED COURSE  46 y.o. male with a past medical history of left nephrectomy, chronic right renal obstruction and a right nephrostomy tube who presents for evaluation of foul smelling urine. Patient concerned that the nephrostomy tube may be partially clogged as he is making urine through his penis. The tube flushes easily and there is urine in his bag. Will empty the bag and monitor uop. UA + for UTI. Will give rocephin and vancomycin, will give IVF. Initially documented that patient was tachycardic in the waiting room however since being placed in the room patient's HR has been in 60-70s. Normal WBC and no fever. Will check lactate and get blood cultures. Will get renal US to ensure proper  placement of nephrostomy catheter.     _________________________ 3:19 PM on 04/28/2017 -----------------------------------------  US showing nephrostomy tube in the collecting system with hydronephrosis. Review of Epic shows stable [place] when compared to prior from 05/2016. Patient reports increasing urine drainage from the nephrostomy tube and no longer having to urinate from his penis. Patient will be dc home on keflex and bactrim. Recommended close f/u with [facility] on Monday for nephrostomy. Discussed strict return precautions with patient for signs of worsening infection.   As part of my medical decision making, I reviewed the following data within the [place] notes reviewed and incorporated, Labs reviewed , Old chart reviewed, Radiograph reviewed , Notes from prior ED visits and [place] Controlled Substance Database    Pertinent labs & imaging results that were available during my care of the patient were reviewed by me and considered in my medical decision making (see chart for details).    ____________________________________________   FINAL CLINICAL IMPRESSION(S) / ED DIAGNOSES  Final diagnoses:  Urinary tract infection associated with nephrostomy catheter, initial encounter ([place])  Obstructed nephrostomy tube ([place])      NEW MEDICATIONS STARTED DURING THIS VISIT:  ED Discharge Orders        Ordered    cephALEXin (KEFLEX) 500 MG capsule  3 times daily     04/28/17 1517    sulfamethoxazole-trimethoprim (BACTRIM DS,SEPTRA DS) 800-160 MG tablet  2 times daily     04/28/17 1517       Note:  This document was prepared using Dragon voice recognition software and may include unintentional dictation errors.    Rudene Re, MD 04/28/17 [phone]

## 2017-04-28 NOTE — ED Notes (Signed)
Patient assisted to the BR.  Skin warm and dry. NAD

## 2017-04-28 NOTE — ED Triage Notes (Signed)
Patient states he has a urostomy tube to right kidney, today noticed urostomy bag is no longer draining and patient is voiding from penis.  Patient states the tube is probably due to be changed.  Patient is cared for by the urology clinic at [facility].

## 2017-04-28 NOTE — ED Notes (Signed)
IV positional, causing meds to be delayed going in. This RN adjusted IV; meds flowing well at this time.

## 2017-04-28 NOTE — Discharge Instructions (Signed)
Follow up with your doctor on Monday. Take antibiotics as prescribed. Return to the ER for worsening flank pain, abdominal pain,. Nausea, vomiting, or fever.

## 2017-05-03 LAB — CULTURE, BLOOD (ROUTINE X 2)
Culture: NO GROWTH
Culture: NO GROWTH
Special Requests: ADEQUATE
Special Requests: ADEQUATE

## 2018-07-22 MED ORDER — ACETAMINOPHEN 325 MG PO TABS
650.00 | ORAL_TABLET | ORAL | Status: DC
Start: ? — End: 2018-07-22

## 2018-07-22 MED ORDER — NICOTINE 21 MG/24HR TD PT24
1.00 | MEDICATED_PATCH | TRANSDERMAL | Status: DC
Start: ? — End: 2018-07-22

## 2018-07-22 MED ORDER — ALBUMIN HUMAN 25 % IV SOLN
25.00 | INTRAVENOUS | Status: DC
Start: ? — End: 2018-07-22

## 2018-07-22 MED ORDER — SODIUM CHLORIDE 0.9 % IV SOLN
200.00 | INTRAVENOUS | Status: DC
Start: ? — End: 2018-07-22

## 2018-07-22 MED ORDER — HEPARIN SODIUM (PORCINE) 1000 UNIT/ML IJ SOLN
3000.00 | INTRAMUSCULAR | Status: DC
Start: ? — End: 2018-07-22

## 2018-07-22 MED ORDER — SEVELAMER CARBONATE 800 MG PO TABS
2400.00 | ORAL_TABLET | ORAL | Status: DC
Start: 2018-07-22 — End: 2018-07-22

## 2018-07-22 MED ORDER — GENERIC EXTERNAL MEDICATION
62.50 | Status: DC
Start: 2018-07-24 — End: 2018-07-22

## 2018-07-22 MED ORDER — INFLUENZA VAC SPLIT QUAD 0.5 ML IM SUSY
0.50 | PREFILLED_SYRINGE | INTRAMUSCULAR | Status: DC
Start: ? — End: 2018-07-22

## 2018-07-22 MED ORDER — OXYCODONE-ACETAMINOPHEN 5-325 MG PO TABS
1.00 | ORAL_TABLET | ORAL | Status: DC
Start: ? — End: 2018-07-22

## 2018-07-22 MED ORDER — SODIUM CHLORIDE 0.9 % IV SOLN
INTRAVENOUS | Status: DC
Start: ? — End: 2018-07-22

## 2018-07-22 MED ORDER — GENERIC EXTERNAL MEDICATION
Status: DC
Start: ? — End: 2018-07-22

## 2018-07-22 MED ORDER — ALBUTEROL SULFATE (2.5 MG/3ML) 0.083% IN NEBU
2.50 | INHALATION_SOLUTION | RESPIRATORY_TRACT | Status: DC
Start: ? — End: 2018-07-22

## 2018-07-22 MED ORDER — CARVEDILOL 6.25 MG PO TABS
6.25 | ORAL_TABLET | ORAL | Status: DC
Start: 2018-07-22 — End: 2018-07-22

## 2018-07-22 MED ORDER — GENERIC EXTERNAL MEDICATION
2.30 | Status: DC
Start: ? — End: 2018-07-22

## 2018-07-22 MED ORDER — CALCITRIOL 0.25 MCG PO CAPS
0.25 | ORAL_CAPSULE | ORAL | Status: DC
Start: 2018-07-23 — End: 2018-07-22

## 2018-07-22 MED ORDER — EPOETIN ALFA-EPBX 4000 UNIT/ML IJ SOLN
4000.00 | INTRAMUSCULAR | Status: DC
Start: 2018-07-24 — End: 2018-07-22

## 2018-07-22 MED ORDER — SODIUM BICARBONATE 650 MG PO TABS
1950.00 | ORAL_TABLET | ORAL | Status: DC
Start: 2018-07-22 — End: 2018-07-22

## 2018-07-22 MED ORDER — GENERIC EXTERNAL MEDICATION
2.20 | Status: DC
Start: ? — End: 2018-07-22

## 2019-04-07 ENCOUNTER — Other Ambulatory Visit (INDEPENDENT_AMBULATORY_CARE_PROVIDER_SITE_OTHER): Payer: Self-pay | Admitting: Vascular Surgery

## 2019-04-07 DIAGNOSIS — N186 End stage renal disease: Secondary | ICD-10-CM

## 2019-04-08 ENCOUNTER — Other Ambulatory Visit (INDEPENDENT_AMBULATORY_CARE_PROVIDER_SITE_OTHER): Payer: Medicaid Other

## 2019-04-08 ENCOUNTER — Encounter (INDEPENDENT_AMBULATORY_CARE_PROVIDER_SITE_OTHER): Payer: Medicaid Other

## 2019-04-08 ENCOUNTER — Encounter (INDEPENDENT_AMBULATORY_CARE_PROVIDER_SITE_OTHER): Payer: Medicaid Other | Admitting: Vascular Surgery

## 2019-07-01 ENCOUNTER — Other Ambulatory Visit (INDEPENDENT_AMBULATORY_CARE_PROVIDER_SITE_OTHER): Payer: Medicaid Other

## 2019-07-01 ENCOUNTER — Encounter (INDEPENDENT_AMBULATORY_CARE_PROVIDER_SITE_OTHER): Payer: Medicaid Other

## 2019-07-01 ENCOUNTER — Encounter (INDEPENDENT_AMBULATORY_CARE_PROVIDER_SITE_OTHER): Payer: Medicaid Other | Admitting: Vascular Surgery

## 2019-07-15 ENCOUNTER — Ambulatory Visit (INDEPENDENT_AMBULATORY_CARE_PROVIDER_SITE_OTHER): Payer: Medicaid Other

## 2019-07-15 ENCOUNTER — Encounter (INDEPENDENT_AMBULATORY_CARE_PROVIDER_SITE_OTHER): Payer: Self-pay | Admitting: Vascular Surgery

## 2019-07-15 ENCOUNTER — Ambulatory Visit (INDEPENDENT_AMBULATORY_CARE_PROVIDER_SITE_OTHER): Payer: Medicaid Other | Admitting: Vascular Surgery

## 2019-07-15 ENCOUNTER — Other Ambulatory Visit: Payer: Self-pay

## 2019-07-15 VITALS — BP 117/76 | HR 83 | Resp 16 | Ht 62.0 in | Wt 108.0 lb

## 2019-07-15 DIAGNOSIS — N186 End stage renal disease: Secondary | ICD-10-CM

## 2019-07-15 DIAGNOSIS — I10 Essential (primary) hypertension: Secondary | ICD-10-CM | POA: Insufficient documentation

## 2019-07-15 NOTE — Progress Notes (Signed)
Patient ID: Larry Morrison, male   DOB: [birth year], 49 y.o.   MRN: [id number]  Chief Complaint  Patient presents with  . New Patient (Initial Visit)    Vein mapping    HPI Larry Morrison is a 49 y.o. male.  I am asked to see the patient by Dr. Juleen China for evaluation of permanent dialysis access.  The patient had a PermCath placed at [facility] several months ago and has been using that for dialysis since that time.  He has never had any extremity access.  It sounds like he has had significant hypertension and has a previous history of stroke.  Sounds like there is a prior history of substance use.  He is okay historian at this time.  He gets dialysis on Mondays, Wednesdays, and Fridays and has no other complaints today. He had noninvasive studies today to evaluate his vascular adequacy for fistula creation.  On the left arm, neither of the basilic nor the cephalic vein appeared to be usable for fistula creation.  On the right arm, the cephalic vein appears to be marginal for fistula creation in the upper arm but is occluded in the forearm.  The basilic vein does appear to be of adequate size on the right arm for fistula creation.  Arterial studies were basically normal in both arms.   Past Medical History:  Diagnosis Date  . Aneurysm ([place])   . Foot drop   . History of nephrostomy   . Hypertension   . Renal disorder   . Stroke [facility])     Past Surgical History:  Procedure Laterality Date  . NEPHRECTOMY    . NEPHRECTOMY    . ORCHIECTOMY Right 10/27/2016   Procedure: PSB ORCHIECTOMY;  Surgeon: Hollice Espy, MD;  Location: [facility] ORS;  Service: Urology;  Laterality: Right;  . ORCHIOPEXY Bilateral 10/27/2016   Procedure: ORCHIOPEXY ADULT;  Surgeon: Hollice Espy, MD;  Location: [facility] ORS;  Service: Urology;  Laterality: Bilateral;  . SCROTAL EXPLORATION Bilateral 10/27/2016   Procedure: SCROTUM EXPLORATION;  Surgeon: Hollice Espy, MD;  Location: [facility] ORS;  Service: Urology;  Laterality:  Bilateral;    Family History No bleeding disorders, clotting disorders, autoimmune diseases, or aneurysms   Social History   Tobacco Use  . Smoking status: Current Some Day Smoker    Packs/day: 0.50    Types: Cigarettes  . Smokeless tobacco: Never Used  Substance Use Topics  . Alcohol use: No  . Drug use: Yes    Types: Marijuana     Allergies  Allergen Reactions  . No Known Allergies     Current Outpatient Medications  Medication Sig Dispense Refill  . docusate sodium (COLACE) 100 MG capsule Take 1 capsule (100 mg total) by mouth 2 (two) times daily. (Patient not taking: Reported on 02/01/2017) 60 capsule 0  . HYDROcodone-acetaminophen (NORCO/VICODIN) 5-325 MG per tablet Take 1 tablet by mouth every 6 (six) hours as needed for moderate pain. (Patient not taking: Reported on 02/01/2017) 15 tablet 0  . oxyCODONE-acetaminophen (ROXICET) 5-325 MG tablet Take 1-2 tablets by mouth every 6 (six) hours as needed. (Patient not taking: Reported on 02/01/2017) 10 tablet 0   No current facility-administered medications for this visit.      REVIEW OF SYSTEMS (Negative unless checked)  Constitutional: [] Weight loss  [] Fever  [] Chills Cardiac: [] Chest pain   [] Chest pressure   [] Palpitations   [] Shortness of breath when laying flat   [] Shortness of breath at rest   [] Shortness of breath with  exertion. Vascular:  [] Pain in legs with walking   [] Pain in legs at rest   [] Pain in legs when laying flat   [] Claudication   [] Pain in feet when walking  [] Pain in feet at rest  [] Pain in feet when laying flat   [] History of DVT   [] Phlebitis   [] Swelling in legs   [] Varicose veins   [] Non-healing ulcers Pulmonary:   [] Uses home oxygen   [] Productive cough   [] Hemoptysis   [] Wheeze  [] COPD   [] Asthma Neurologic:  [] Dizziness  [] Blackouts   [] Seizures   [x] History of stroke   [] History of TIA  [] Aphasia   [] Temporary blindness   [] Dysphagia   [] Weakness or numbness in arms   [x] Weakness or numbness in  legs Musculoskeletal:  [x] Arthritis   [] Joint swelling   [] Joint pain   [] Low back pain x positive for foot drop Hematologic:  [] Easy bruising  [] Easy bleeding   [] Hypercoagulable state   [] Anemic  [] Hepatitis Gastrointestinal:  [] Blood in stool   [] Vomiting blood  [] Gastroesophageal reflux/heartburn   [] Abdominal pain Genitourinary:  [x] Chronic kidney disease   [] Difficult urination  [] Frequent urination  [] Burning with urination   [] Hematuria Skin:  [] Rashes   [] Ulcers   [] Wounds Psychological:  [] History of anxiety   []  History of major depression.    Physical Exam BP 117/76   Pulse 83   Resp 16   Ht 5\' 2"  (1.575 m)   Wt 108 lb (49 kg)   BMI 19.75 kg/m  Gen: Thin, debilitated appearing and appears older than stated age Head: [place]/AT, No temporalis wasting.  Ear/Nose/Throat: Hearing grossly intact, nares w/o erythema or drainage, oropharynx w/o Erythema/Exudate Eyes: Conjunctiva clear, sclera non-icteric  Neck: trachea midline.  No JVD.  Pulmonary:  Good air movement, respirations not labored, no use of accessory muscles  Cardiac: RRR, no JVD Vascular: Allen's test normal bilaterally Vessel Right Left  Radial Palpable Palpable                                   Gastrointestinal:. No masses, surgical incisions, or scars. Musculoskeletal: Patient has foot drop and walks with a Saulnier.  Unsteady gait. Neurologic: Sensation grossly intact in extremities.  Symmetrical.  Speech is a little difficult to understand. Motor exam as listed above. Psychiatric: Judgment and insight appear to be okay.  He is a fair historian Dermatologic: No rashes or ulcers noted.  No cellulitis or open wounds.    Radiology No results found.  Labs No results found for this or any previous visit (from the past 2160 hour(s)).  Assessment/Plan:  Essential hypertension That as well as his previous urologic issues are likely underlying cause of his renal failure. blood pressure control important in  reducing the progression of atherosclerotic disease. On appropriate oral medications.   ESRD (end stage renal disease) ([place]) He had noninvasive studies today to evaluate his vascular adequacy for fistula creation.  On the left arm, neither of the basilic nor the cephalic vein appeared to be usable for fistula creation.  On the right arm, the cephalic vein appears to be marginal for fistula creation in the upper arm but is occluded in the forearm.  The basilic vein does appear to be of adequate size on the right arm for fistula creation.  Arterial studies were basically normal in both arms. The patient needs permanent dialysis access.  He has been on dialysis now for about 6 months.  Given the findings on noninvasive studies as above, I would recommend exploring his right arm with the anticipation that we will place a right brachiocephalic AV fistula if the vein is adequate.  If the cephalic vein is not adequate at the time of exploration, I would plan a brachiobasilic AV fistula creation.  Obviously with that being a two-stage operation that is less desirable.  The patient is discussed these options and voices understanding and is agreeable to proceed.      Leotis Pain 07/15/2019, 11:40 AM   This note was created with Dragon medical transcription system.  Any errors from dictation are unintentional.

## 2019-07-15 NOTE — Patient Instructions (Signed)
AV Fistula Placement  Arteriovenous (AV) fistula placement is a surgical procedure to create a connection between a blood vessel that carries blood away from your heart (artery) and a blood vessel that returns blood to your heart (vein). This connection is called a fistula. It is often made in the forearm or upper arm. You may need this procedure if you are getting hemodialysis treatments for kidney disease. An AV fistula makes your vein larger and stronger over several months. This makes the vein a safe and easy spot to insert the needles that are used for hemodialysis. Tell a health care provider about:  Any allergies you have.  All medicines you are taking, including vitamins, herbs, eye drops, creams, and over-the-counter medicines.  Any problems you or family members have had with anesthetic medicines.  Any blood disorders you have.  Any surgeries you have had.  Any medical conditions you have or have had in the past. What are the risks? Generally, this is a safe procedure. However, problems may occur, including:  Infection.  Blood clot (thrombosis).  Reduced blood flow (stenosis).  Weakening or ballooning out of the fistula (aneurysm).  Bleeding.  Allergic reactions to medicines.  Nerve damage.  Swelling near the fistula (lymphedema).  Weakening of your heart (congestive heart failure).  Failure of the procedure. What happens before the procedure? Staying hydrated Follow instructions from your health care provider about hydration, which may include:  Up to 2 hours before the procedure - you may continue to drink clear liquids, such as water, clear fruit juice, black coffee, and plain tea.  Eating and drinking restrictions Follow instructions from your health care provider about eating and drinking, which may include:  8 hours before the procedure - stop eating heavy meals or foods, such as meat, fried foods, or fatty foods.  6 hours before the procedure - stop  eating light meals or foods, such as toast or cereal.  6 hours before the procedure - stop drinking milk or drinks that contain milk.  2 hours before the procedure - stop drinking clear liquids. Medicines Ask your health care provider about:  Changing or stopping your regular medicines. This is especially important if you are taking diabetes medicines or blood thinners.  Taking medicines such as aspirin and ibuprofen. These medicines can thin your blood. Do not take these medicines unless your health care provider tells you to take them.  Taking over-the-counter medicines, vitamins, herbs, and supplements. General instructions  Imaging tests of your arm may be done to find the best place for the fistula.  Plan to have someone take you home from the hospital or clinic.  Ask your health care provider how your surgical site will be marked or identified.  Ask your health care provider what steps will be taken to help prevent infection. These may include: ? Removing hair at the surgery site. ? Washing skin with a germ-killing soap. ? Taking antibiotic medicine.  Do not use any products that contain nicotine or tobacco for as long as possible before and after the procedure. These products include cigarettes, e-cigarettes, and chewing tobacco. If you need help quitting, ask your health care provider. What happens during the procedure?  An IV will be inserted into one of your veins.  You will be given one or more of the following: ? A medicine to help you relax (sedative). ? A medicine to numb the area (local anesthetic). ? A medicine to make you fall asleep (general anesthetic). ? A medicine that  is injected into an area of your body to numb everything below the injection site (regional anesthetic).  The fistula site will be cleaned with a germ-killing solution (antiseptic).  An incision will be made on the inner side of your arm.  A vein and an artery will be opened and connected  with stitches (sutures).  The incision will be closed with sutures or clips.  A bandage (dressing) will be placed over the area. The procedure may vary among health care providers and hospitals. What happens after the procedure?  Your blood pressure, heart rate, breathing rate, and blood oxygen level may be monitored until you leave the hospital or clinic.  Your fistula site will be checked for bleeding or swelling.  You will be given pain medicine as needed.  Do not drive for 24 hours if you were given a sedative during your procedure. Summary  Arteriovenous (AV) fistula placement is a surgical procedure to create a connection between a blood vessel that carries blood away from your heart (artery) and a blood vessel that returns blood to your heart (vein). This connection is called a fistula.  Follow instructions from your health care provider about eating and drinking before the procedure.  Ask your health care provider about changing or stopping your regular medicines before the procedure. This is especially important if you are taking diabetes medicines or blood thinners.  Do not drive for 24 hours if you were given a sedative during your procedure.  Plan to have someone take you home from the hospital or clinic. This information is not intended to replace advice given to you by your health care provider. Make sure you discuss any questions you have with your health care provider. Document Revised: 10/25/2018 Document Reviewed: 11/12/2017 Elsevier Patient Education  [place].

## 2019-07-15 NOTE — Assessment & Plan Note (Signed)
That as well as his previous urologic issues are likely underlying cause of his renal failure. blood pressure control important in reducing the progression of atherosclerotic disease. On appropriate oral medications.

## 2019-07-15 NOTE — Assessment & Plan Note (Signed)
He had noninvasive studies today to evaluate his vascular adequacy for fistula creation.  On the left arm, neither of the basilic nor the cephalic vein appeared to be usable for fistula creation.  On the right arm, the cephalic vein appears to be marginal for fistula creation in the upper arm but is occluded in the forearm.  The basilic vein does appear to be of adequate size on the right arm for fistula creation.  Arterial studies were basically normal in both arms. The patient needs permanent dialysis access.  He has been on dialysis now for about 6 months.  Given the findings on noninvasive studies as above, I would recommend exploring his right arm with the anticipation that we will place a right brachiocephalic AV fistula if the vein is adequate.  If the cephalic vein is not adequate at the time of exploration, I would plan a brachiobasilic AV fistula creation.  Obviously with that being a two-stage operation that is less desirable.  The patient is discussed these options and voices understanding and is agreeable to proceed.

## 2019-07-24 ENCOUNTER — Telehealth (INDEPENDENT_AMBULATORY_CARE_PROVIDER_SITE_OTHER): Payer: Self-pay

## 2019-07-24 NOTE — Telephone Encounter (Signed)
Spoke with Angie at [facility] on 07/23/19 and the patient is now scheduled with Dr. Lucky Cowboy for a Right AV Fistula on 08/07/19. Patient will do a phone call pre-op on 07/30/19 between 1-5 pm and covid testing on 08/05/19 between 12:30-2:30 pm at the [place]. Pre-surgery instructions will be faxed to [facility] per Angie.

## 2019-07-29 ENCOUNTER — Other Ambulatory Visit (INDEPENDENT_AMBULATORY_CARE_PROVIDER_SITE_OTHER): Payer: Self-pay | Admitting: Nurse Practitioner

## 2019-07-30 ENCOUNTER — Inpatient Hospital Stay: Admission: RE | Admit: 2019-07-30 | Payer: Medicaid Other | Source: Ambulatory Visit

## 2019-07-31 ENCOUNTER — Encounter
Admission: RE | Admit: 2019-07-31 | Discharge: 2019-07-31 | Disposition: A | Discharge status: Home / Self Care | Payer: Medicaid Other | Source: Ambulatory Visit | Attending: Vascular Surgery | Admitting: Vascular Surgery

## 2019-07-31 ENCOUNTER — Other Ambulatory Visit: Payer: Self-pay

## 2019-07-31 DIAGNOSIS — Z01818 Encounter for other preprocedural examination: Secondary | ICD-10-CM | POA: Diagnosis not present

## 2019-07-31 HISTORY — DX: Personal history of urinary calculi: Z87.442

## 2019-07-31 HISTORY — DX: Anemia, unspecified: D64.9

## 2019-07-31 NOTE — Patient Instructions (Signed)
Your procedure is scheduled on: Thurs. 3/18 Report to Day Surgery. [facility] To find out your arrival time please call [phone] between [place] on Wed. 3/17.  Remember: Instructions that are not followed completely may result in serious medical risk,  up to and including death, or upon the discretion of your surgeon and anesthesiologist your  surgery may need to be rescheduled.     _X__ 1. Do not eat food after midnight the night before your procedure.                 No gum chewing or hard candies. You may drink clear liquids up to 2 hours                 before you are scheduled to arrive for your surgery- DO not drink clear                 liquids within 2 hours of the start of your surgery.                 Clear Liquids include:  water, apple juice without pulp, clear Gatorade, G2 or                  Gatorade Zero (avoid Red/Purple/Blue), Black Coffee or Tea (Do not add                 anything to coffee or tea). _____2.   Complete the carbohydrate drink provided to you, 2 hours before arrival.  __X__2.  On the morning of surgery brush your teeth with toothpaste and water, you                may rinse your mouth with mouthwash if you wish.  Do not swallow any toothpaste of mouthwash.     ___ 3.  No Alcohol for 24 hours before or after surgery.   _X__ 4.  Do Not Smoke or use e-cigarettes For 24 Hours Prior to Your Surgery.                 Do not use any chewable tobacco products for at least 6 hours prior to                 surgery.  ____  5.  Bring all medications with you on the day of surgery if instructed.   _x___  6.  Notify your doctor if there is any change in your medical condition      (cold, fever, infections).     Do not wear jewelry,  Do not wear lotions, powders, or perfumes. Do not shave 48 hours prior to surgery. Men may shave face and neck. Do not bring valuables to the hospital.    [facility] is not responsible for any  belongings or valuables.  Contacts, dentures or bridgework may not be worn into surgery. Leave your suitcase in the car. After surgery it may be brought to your room. For patients admitted to the hospital, discharge time is determined by your treatment team.   Patients discharged the day of surgery will not be allowed to drive home.   Make arrangements for someone to be with you for the first 24 hours of your Same Day Discharge.    Please read over the following fact sheets that you were given:      ____ Take these medicines the morning of surgery with A SIP OF WATER:    1. none  2.  3.   4.  5.  6.  ____ Fleet Enema (as directed)     __x__ Use CHG or wipes as directed  (see instructions enclosed)  ____ Use Benzoyl Peroxide Gel as instructed  ____ Use inhalers on the day of surgery  ____ Stop metformin 2 days prior to surgery    ____ Take 1/2 of usual insulin dose the night before surgery. No insulin the morning          of surgery.   ____ Stop Coumadin/Plavix/aspirin on   __x__ Stop Anti-inflammatories ibuprofen aleve or aspirin until after surgery     May take tylenol   ____ Stop supplements until after surgery.    ____ Bring C-Pap to the hospital.

## 2019-08-05 ENCOUNTER — Encounter
Admission: RE | Admit: 2019-08-05 | Discharge: 2019-08-05 | Disposition: A | Discharge status: Home / Self Care | Payer: Medicaid Other | Source: Ambulatory Visit | Attending: Vascular Surgery | Admitting: Vascular Surgery

## 2019-08-05 ENCOUNTER — Other Ambulatory Visit: Payer: Self-pay

## 2019-08-05 ENCOUNTER — Other Ambulatory Visit: Payer: Medicaid Other

## 2019-08-05 DIAGNOSIS — N186 End stage renal disease: Secondary | ICD-10-CM | POA: Insufficient documentation

## 2019-08-05 DIAGNOSIS — Z01818 Encounter for other preprocedural examination: Secondary | ICD-10-CM | POA: Diagnosis present

## 2019-08-05 DIAGNOSIS — Z20822 Contact with and (suspected) exposure to covid-19: Secondary | ICD-10-CM | POA: Diagnosis not present

## 2019-08-05 LAB — CBC WITH DIFFERENTIAL/PLATELET
Abs Immature Granulocytes: 0.01 10*3/uL (ref 0.00–0.07)
Basophils Absolute: 0.1 10*3/uL (ref 0.0–0.1)
Basophils Relative: 1 %
Eosinophils Absolute: 0.3 10*3/uL (ref 0.0–0.5)
Eosinophils Relative: 6 %
HCT: 35.5 % — ABNORMAL LOW (ref 39.0–52.0)
Hemoglobin: 11.3 g/dL — ABNORMAL LOW (ref 13.0–17.0)
Immature Granulocytes: 0 %
Lymphocytes Relative: 35 %
Lymphs Abs: 1.5 10*3/uL (ref 0.7–4.0)
MCH: 31.6 pg (ref 26.0–34.0)
MCHC: 31.8 g/dL (ref 30.0–36.0)
MCV: 99.2 fL (ref 80.0–100.0)
Monocytes Absolute: 0.5 10*3/uL (ref 0.1–1.0)
Monocytes Relative: 11 %
Neutro Abs: 2 10*3/uL (ref 1.7–7.7)
Neutrophils Relative %: 47 %
Platelets: 239 10*3/uL (ref 150–400)
RBC: 3.58 MIL/uL — ABNORMAL LOW (ref 4.22–5.81)
RDW: 13.3 % (ref 11.5–15.5)
WBC: 4.3 10*3/uL (ref 4.0–10.5)
nRBC: 0 % (ref 0.0–0.2)

## 2019-08-05 LAB — TYPE AND SCREEN
ABO/RH(D): B POS
Antibody Screen: NEGATIVE

## 2019-08-05 LAB — BASIC METABOLIC PANEL
Anion gap: 8 (ref 5–15)
BUN: 41 mg/dL — ABNORMAL HIGH (ref 6–20)
CO2: 28 mmol/L (ref 22–32)
Calcium: 7.7 mg/dL — ABNORMAL LOW (ref 8.9–10.3)
Chloride: 105 mmol/L (ref 98–111)
Creatinine, Ser: 11.57 mg/dL — ABNORMAL HIGH (ref 0.61–1.24)
GFR calc Af Amer: 5 mL/min — ABNORMAL LOW (ref >60)
GFR calc non Af Amer: 5 mL/min — ABNORMAL LOW (ref >60)
Glucose, Bld: 90 mg/dL (ref 70–99)
Potassium: 4.2 mmol/L (ref 3.5–5.1)
Sodium: 141 mmol/L (ref 135–145)

## 2019-08-05 LAB — APTT: aPTT: 38 seconds — ABNORMAL HIGH (ref 24–36)

## 2019-08-05 LAB — PROTIME-INR
INR: 1.2 (ref 0.8–1.2)
Prothrombin Time: 14.7 seconds (ref 11.4–15.2)

## 2019-08-06 LAB — SARS CORONAVIRUS 2 (TAT 6-24 HRS): SARS Coronavirus 2: NEGATIVE

## 2019-08-07 ENCOUNTER — Ambulatory Visit: Payer: Medicaid Other | Admitting: Anesthesiology

## 2019-08-07 ENCOUNTER — Encounter: Payer: Self-pay | Admitting: Vascular Surgery

## 2019-08-07 ENCOUNTER — Other Ambulatory Visit: Payer: Self-pay

## 2019-08-07 ENCOUNTER — Encounter
Admission: RE | Disposition: A | Discharge status: Home / Self Care | Payer: Self-pay | Source: Home / Self Care | Attending: Vascular Surgery

## 2019-08-07 ENCOUNTER — Ambulatory Visit
Admission: RE | Admit: 2019-08-07 | Discharge: 2019-08-07 | Disposition: A | Discharge status: Home / Self Care | Payer: Medicaid Other | Attending: Vascular Surgery | Admitting: Vascular Surgery

## 2019-08-07 ENCOUNTER — Ambulatory Visit: Payer: Medicaid Other

## 2019-08-07 DIAGNOSIS — I120 Hypertensive chronic kidney disease with stage 5 chronic kidney disease or end stage renal disease: Secondary | ICD-10-CM | POA: Insufficient documentation

## 2019-08-07 DIAGNOSIS — Z9079 Acquired absence of other genital organ(s): Secondary | ICD-10-CM | POA: Insufficient documentation

## 2019-08-07 DIAGNOSIS — Z8673 Personal history of transient ischemic attack (TIA), and cerebral infarction without residual deficits: Secondary | ICD-10-CM | POA: Insufficient documentation

## 2019-08-07 DIAGNOSIS — Z905 Acquired absence of kidney: Secondary | ICD-10-CM | POA: Diagnosis not present

## 2019-08-07 DIAGNOSIS — F17210 Nicotine dependence, cigarettes, uncomplicated: Secondary | ICD-10-CM | POA: Insufficient documentation

## 2019-08-07 DIAGNOSIS — N186 End stage renal disease: Secondary | ICD-10-CM | POA: Diagnosis present

## 2019-08-07 DIAGNOSIS — N185 Chronic kidney disease, stage 5: Secondary | ICD-10-CM

## 2019-08-07 DIAGNOSIS — Z419 Encounter for procedure for purposes other than remedying health state, unspecified: Secondary | ICD-10-CM

## 2019-08-07 HISTORY — PX: AV FISTULA PLACEMENT: SHX1204

## 2019-08-07 LAB — POCT I-STAT, CHEM 8
BUN: 21 mg/dL — ABNORMAL HIGH (ref 6–20)
Calcium, Ion: 0.76 mmol/L — CL (ref 1.15–1.40)
Chloride: 104 mmol/L (ref 98–111)
Creatinine, Ser: 8 mg/dL — ABNORMAL HIGH (ref 0.61–1.24)
Glucose, Bld: 67 mg/dL — ABNORMAL LOW (ref 70–99)
HCT: 37 % — ABNORMAL LOW (ref 39.0–52.0)
Hemoglobin: 12.6 g/dL — ABNORMAL LOW (ref 13.0–17.0)
Potassium: 4.3 mmol/L (ref 3.5–5.1)
Sodium: 138 mmol/L (ref 135–145)
TCO2: 27 mmol/L (ref 22–32)

## 2019-08-07 LAB — ABO/RH: ABO/RH(D): B POS

## 2019-08-07 LAB — URINE DRUG SCREEN, QUALITATIVE (ARMC ONLY)
Amphetamines, Ur Screen: NOT DETECTED
Barbiturates, Ur Screen: NOT DETECTED
Benzodiazepine, Ur Scrn: NOT DETECTED
Cannabinoid 50 Ng, Ur ~~LOC~~: POSITIVE — AB
Cocaine Metabolite,Ur ~~LOC~~: POSITIVE — AB
MDMA (Ecstasy)Ur Screen: NOT DETECTED
Methadone Scn, Ur: NOT DETECTED
Opiate, Ur Screen: NOT DETECTED
Phencyclidine (PCP) Ur S: NOT DETECTED
Tricyclic, Ur Screen: NOT DETECTED

## 2019-08-07 SURGERY — ARTERIOVENOUS (AV) FISTULA CREATION
Anesthesia: General | Site: Arm Lower | Laterality: Right

## 2019-08-07 MED ORDER — PHENYLEPHRINE HCL (PRESSORS) 10 MG/ML IV SOLN
INTRAVENOUS | Status: AC
  Filled 2019-08-07: qty 1

## 2019-08-07 MED ORDER — ACETAMINOPHEN 325 MG PO TABS: 325.0000 mg | ORAL_TABLET | ORAL | Status: DC | PRN

## 2019-08-07 MED ORDER — VASOPRESSIN 20 UNIT/ML IV SOLN
INTRAVENOUS | Status: AC
  Filled 2019-08-07: qty 1

## 2019-08-07 MED ORDER — DEXAMETHASONE SODIUM PHOSPHATE 10 MG/ML IJ SOLN
INTRAMUSCULAR | Status: DC | PRN
  Administered 2019-08-07: 10 mg via INTRAVENOUS

## 2019-08-07 MED ORDER — LIDOCAINE HCL (PF) 1 % IJ SOLN
INTRAMUSCULAR | Status: AC
  Filled 2019-08-07: qty 5

## 2019-08-07 MED ORDER — GLYCOPYRROLATE 0.2 MG/ML IJ SOLN
INTRAMUSCULAR | Status: AC
  Filled 2019-08-07: qty 1

## 2019-08-07 MED ORDER — FENTANYL CITRATE (PF) 100 MCG/2ML IJ SOLN: 50.0000 ug | Freq: Once | INTRAMUSCULAR | Status: AC

## 2019-08-07 MED ORDER — FAMOTIDINE 20 MG PO TABS: 20.0000 mg | ORAL_TABLET | Freq: Once | ORAL | Status: AC

## 2019-08-07 MED ORDER — CHLORHEXIDINE GLUCONATE CLOTH 2 % EX PADS
6.0000 | MEDICATED_PAD | Freq: Once | CUTANEOUS | Status: AC
  Administered 2019-08-07: 6 via TOPICAL

## 2019-08-07 MED ORDER — SODIUM CHLORIDE 0.9 % IV SOLN
INTRAVENOUS | Status: DC | PRN
  Administered 2019-08-07: 500 mL via INTRAMUSCULAR

## 2019-08-07 MED ORDER — HYDROCODONE-ACETAMINOPHEN 5-325 MG PO TABS: 1.0000 | ORAL_TABLET | Freq: Four times a day (QID) | ORAL | 0 refills | Status: DC | PRN

## 2019-08-07 MED ORDER — ASPIRIN EC 81 MG PO TBEC: 81.0000 mg | DELAYED_RELEASE_TABLET | Freq: Every day | ORAL | 2 refills | Status: DC

## 2019-08-07 MED ORDER — ONDANSETRON HCL 4 MG/2ML IJ SOLN
INTRAMUSCULAR | Status: DC | PRN
  Administered 2019-08-07: 4 mg via INTRAVENOUS

## 2019-08-07 MED ORDER — HEPARIN SODIUM (PORCINE) 5000 UNIT/ML IJ SOLN
INTRAMUSCULAR | Status: AC
  Filled 2019-08-07: qty 1

## 2019-08-07 MED ORDER — CEFAZOLIN SODIUM-DEXTROSE 1-4 GM/50ML-% IV SOLN
1.0000 g | INTRAVENOUS | Status: AC
  Administered 2019-08-07: 1 g via INTRAVENOUS

## 2019-08-07 MED ORDER — HEPARIN SODIUM (PORCINE) 1000 UNIT/ML IJ SOLN
2000.0000 [IU] | Freq: Once | INTRAMUSCULAR | Status: AC
  Administered 2019-08-07: 15:00:00 2000 [IU] via INTRAVENOUS
  Filled 2019-08-07 (×2): qty 2

## 2019-08-07 MED ORDER — FAMOTIDINE 20 MG PO TABS
ORAL_TABLET | ORAL | Status: AC
  Administered 2019-08-07: 20 mg via ORAL
  Filled 2019-08-07: qty 1

## 2019-08-07 MED ORDER — BUPIVACAINE HCL (PF) 0.5 % IJ SOLN
INTRAMUSCULAR | Status: AC
  Filled 2019-08-07: qty 30

## 2019-08-07 MED ORDER — HEMOSTATIC AGENTS (NO CHARGE) OPTIME
TOPICAL | Status: DC | PRN
  Administered 2019-08-07: 1 via TOPICAL

## 2019-08-07 MED ORDER — CEFAZOLIN SODIUM-DEXTROSE 1-4 GM/50ML-% IV SOLN
INTRAVENOUS | Status: AC
  Filled 2019-08-07: qty 50

## 2019-08-07 MED ORDER — EPINEPHRINE PF 1 MG/ML IJ SOLN
INTRAMUSCULAR | Status: AC
  Filled 2019-08-07: qty 1

## 2019-08-07 MED ORDER — SODIUM CHLORIDE FLUSH 0.9 % IV SOLN
INTRAVENOUS | Status: AC
  Filled 2019-08-07: qty 10

## 2019-08-07 MED ORDER — ACETAMINOPHEN 160 MG/5ML PO SOLN
325.0000 mg | ORAL | Status: DC | PRN
  Filled 2019-08-07: qty 20.3

## 2019-08-07 MED ORDER — SODIUM CHLORIDE 0.9 % IV SOLN: INTRAVENOUS | Status: DC

## 2019-08-07 MED ORDER — HEPARIN SODIUM (PORCINE) 1000 UNIT/ML IJ SOLN
INTRAMUSCULAR | Status: DC | PRN
  Administered 2019-08-07: 3000 [IU] via INTRAVENOUS

## 2019-08-07 MED ORDER — MIDAZOLAM HCL 2 MG/2ML IJ SOLN
INTRAMUSCULAR | Status: AC
  Filled 2019-08-07: qty 2

## 2019-08-07 MED ORDER — MIDAZOLAM HCL 2 MG/2ML IJ SOLN
INTRAMUSCULAR | Status: AC
  Administered 2019-08-07: 1 mg via INTRAVENOUS
  Filled 2019-08-07: qty 2

## 2019-08-07 MED ORDER — ROPIVACAINE HCL 5 MG/ML IJ SOLN
INTRAMUSCULAR | Status: DC | PRN
  Administered 2019-08-07: 25 mL via PERINEURAL

## 2019-08-07 MED ORDER — DEXAMETHASONE SODIUM PHOSPHATE 10 MG/ML IJ SOLN
INTRAMUSCULAR | Status: AC
  Filled 2019-08-07: qty 1

## 2019-08-07 MED ORDER — HYDROCODONE-ACETAMINOPHEN 7.5-325 MG PO TABS
1.0000 | ORAL_TABLET | Freq: Once | ORAL | Status: DC | PRN
  Filled 2019-08-07: qty 1

## 2019-08-07 MED ORDER — PAPAVERINE HCL 30 MG/ML IJ SOLN
INTRAMUSCULAR | Status: AC
  Filled 2019-08-07: qty 2

## 2019-08-07 MED ORDER — PROPOFOL 500 MG/50ML IV EMUL
INTRAVENOUS | Status: DC | PRN
  Administered 2019-08-07: 75 ug/kg/min via INTRAVENOUS

## 2019-08-07 MED ORDER — MIDAZOLAM HCL 2 MG/2ML IJ SOLN: 1.0000 mg | Freq: Once | INTRAMUSCULAR | Status: AC

## 2019-08-07 MED ORDER — ROPIVACAINE HCL 5 MG/ML IJ SOLN
INTRAMUSCULAR | Status: AC
  Filled 2019-08-07: qty 30

## 2019-08-07 MED ORDER — PROMETHAZINE HCL 25 MG/ML IJ SOLN: 6.2500 mg | INTRAMUSCULAR | Status: DC | PRN

## 2019-08-07 MED ORDER — VASOPRESSIN 20 UNIT/ML IV SOLN
INTRAVENOUS | Status: DC | PRN
  Administered 2019-08-07 (×5): 1 [IU] via INTRAVENOUS

## 2019-08-07 MED ORDER — GLYCOPYRROLATE 0.2 MG/ML IJ SOLN
INTRAMUSCULAR | Status: DC | PRN
  Administered 2019-08-07: .2 mg via INTRAVENOUS

## 2019-08-07 MED ORDER — ONDANSETRON HCL 4 MG/2ML IJ SOLN
INTRAMUSCULAR | Status: AC
  Filled 2019-08-07: qty 2

## 2019-08-07 MED ORDER — PHENYLEPHRINE HCL (PRESSORS) 10 MG/ML IV SOLN
INTRAVENOUS | Status: DC | PRN
  Administered 2019-08-07 (×4): 100 ug via INTRAVENOUS

## 2019-08-07 MED ORDER — PROPOFOL 500 MG/50ML IV EMUL
INTRAVENOUS | Status: AC
  Filled 2019-08-07: qty 50

## 2019-08-07 MED ORDER — FENTANYL CITRATE (PF) 100 MCG/2ML IJ SOLN
INTRAMUSCULAR | Status: AC
  Filled 2019-08-07: qty 2

## 2019-08-07 MED ORDER — MIDAZOLAM HCL 2 MG/2ML IJ SOLN
INTRAMUSCULAR | Status: DC | PRN
  Administered 2019-08-07 (×2): 1 mg via INTRAVENOUS

## 2019-08-07 MED ORDER — FENTANYL CITRATE (PF) 100 MCG/2ML IJ SOLN
INTRAMUSCULAR | Status: AC
  Administered 2019-08-07: 50 ug via INTRAVENOUS
  Filled 2019-08-07: qty 2

## 2019-08-07 SURGICAL SUPPLY — 51 items
BAG DECANTER FOR FLEXI CONT (MISCELLANEOUS) ×3 IMPLANT
BLADE SURG SZ11 CARB STEEL (BLADE) ×3 IMPLANT
BOOT SUTURE AID YELLOW STND (SUTURE) ×3 IMPLANT
BRUSH SCRUB EZ  4% CHG (MISCELLANEOUS) ×2
BRUSH SCRUB EZ 4% CHG (MISCELLANEOUS) ×1 IMPLANT
CANISTER SUCT 1200ML W/VALVE (MISCELLANEOUS) ×3 IMPLANT
CHLORAPREP W/TINT 26 (MISCELLANEOUS) ×3 IMPLANT
CLIP SPRNG 6MM S-JAW DBL (CLIP) ×3
COVER WAND RF STERILE (DRAPES) ×3 IMPLANT
DERMABOND ADVANCED (GAUZE/BANDAGES/DRESSINGS) ×2
DERMABOND ADVANCED .7 DNX12 (GAUZE/BANDAGES/DRESSINGS) ×1 IMPLANT
ELECT CAUTERY BLADE 6.4 (BLADE) ×3 IMPLANT
ELECT REM PT RETURN 9FT ADLT (ELECTROSURGICAL) ×3
ELECTRODE REM PT RTRN 9FT ADLT (ELECTROSURGICAL) ×1 IMPLANT
GEL ULTRASOUND 20GR AQUASONIC (MISCELLANEOUS) IMPLANT
GLOVE BIO SURGEON STRL SZ7 (GLOVE) ×6 IMPLANT
GLOVE INDICATOR 7.5 STRL GRN (GLOVE) ×3 IMPLANT
GOWN STRL REUS W/ TWL LRG LVL3 (GOWN DISPOSABLE) ×2 IMPLANT
GOWN STRL REUS W/ TWL XL LVL3 (GOWN DISPOSABLE) ×1 IMPLANT
GOWN STRL REUS W/TWL LRG LVL3 (GOWN DISPOSABLE) ×4
GOWN STRL REUS W/TWL XL LVL3 (GOWN DISPOSABLE) ×2
HEMOSTAT SURGICEL 2X3 (HEMOSTASIS) ×3 IMPLANT
IV NS 500ML (IV SOLUTION) ×2
IV NS 500ML BAXH (IV SOLUTION) ×1 IMPLANT
KIT TURNOVER KIT A (KITS) ×3 IMPLANT
LABEL OR SOLS (LABEL) ×3 IMPLANT
LOOP RED MAXI  1X406MM (MISCELLANEOUS) ×2
LOOP VESSEL MAXI 1X406 RED (MISCELLANEOUS) ×1 IMPLANT
LOOP VESSEL MINI 0.8X406 BLUE (MISCELLANEOUS) ×1 IMPLANT
LOOPS BLUE MINI 0.8X406MM (MISCELLANEOUS) ×2
NEEDLE FILTER BLUNT 18X 1/2SAF (NEEDLE) ×2
NEEDLE FILTER BLUNT 18X1 1/2 (NEEDLE) ×1 IMPLANT
NS IRRIG 500ML POUR BTL (IV SOLUTION) ×3 IMPLANT
PACK EXTREMITY ARMC (MISCELLANEOUS) ×3 IMPLANT
PAD PREP 24X41 OB/GYN DISP (PERSONAL CARE ITEMS) ×3 IMPLANT
SLING ARM M TX990204 (SOFTGOODS) ×3 IMPLANT
SOLUTION CELL SAVER (CLIP) ×1 IMPLANT
STOCKINETTE STRL 4IN 9604848 (GAUZE/BANDAGES/DRESSINGS) ×3 IMPLANT
SUT MNCRL AB 4-0 PS2 18 (SUTURE) ×3 IMPLANT
SUT PROLENE 6 0 BV (SUTURE) ×12 IMPLANT
SUT SILK 2 0 (SUTURE) ×2
SUT SILK 2-0 18XBRD TIE 12 (SUTURE) ×1 IMPLANT
SUT SILK 3 0 (SUTURE) ×2
SUT SILK 3-0 18XBRD TIE 12 (SUTURE) ×1 IMPLANT
SUT SILK 4 0 (SUTURE) ×2
SUT SILK 4-0 18XBRD TIE 12 (SUTURE) ×1 IMPLANT
SUT VIC AB 3-0 SH 27 (SUTURE) ×2
SUT VIC AB 3-0 SH 27X BRD (SUTURE) ×1 IMPLANT
SYR 20ML LL LF (SYRINGE) ×3 IMPLANT
SYR 3ML LL SCALE MARK (SYRINGE) ×3 IMPLANT
SYR TB 1ML 27GX1/2 LL (SYRINGE) IMPLANT

## 2019-08-07 NOTE — Discharge Instructions (Signed)
AMBULATORY SURGERY  °DISCHARGE INSTRUCTIONS ° ° °1) The drugs that you were given will stay in your system until tomorrow so for the next 24 hours you should not: ° °A) Drive an automobile °B) Make any legal decisions °C) Drink any alcoholic beverage ° ° °2) You may resume regular meals tomorrow.  Today it is better to start with liquids and gradually work up to solid foods. ° °You may eat anything you prefer, but it is better to start with liquids, then soup and crackers, and gradually work up to solid foods. ° ° °3) Please notify your doctor immediately if you have any unusual bleeding, trouble breathing, redness and pain at the surgery site, drainage, fever, or pain not relieved by medication. ° ° ° °4) Additional Instructions: ° ° ° ° ° ° ° °Please contact your physician with any problems or Same Day Surgery at [phone], Monday through Friday 6 am to 4 pm, or [place] at [place] Main number at [phone]. °

## 2019-08-07 NOTE — Op Note (Signed)
[place] [facility]   OPERATIVE NOTE   PROCEDURE: Left brachiocephalic arteriovenous fistula placement  PRE-OPERATIVE DIAGNOSIS: 1.  ESRD        POST-OPERATIVE DIAGNOSIS: 1. ESRD       SURGEON: Leotis Pain, MD  ASSISTANT(S): Hezzie Bump, PA-C  ANESTHESIA: general  ESTIMATED BLOOD LOSS: 10 cc  FINDING(S): Adequate cephalic vein for fistula creation  SPECIMEN(S):  none  INDICATIONS:   Larry Morrison is a 49 y.o. male who presents with renal failure in need of pemanent dialysis acces.  The patient is scheduled for left arm AVF placement.  The patient is aware the risks include but are not limited to: bleeding, infection, steal syndrome, nerve damage, ischemic monomelic neuropathy, failure to mature, and need for additional procedures.  The patient is aware of the risks of the procedure and elects to proceed forward. An assistant was present during the procedure to help facilitate the exposure and expedite the procedure.  DESCRIPTION: After full informed written consent was obtained from the patient, the patient was brought back to the operating room and placed supine upon the operating table.  Prior to induction, the patient received IV antibiotics.   After obtaining adequate anesthesia, the patient was then prepped and draped in the standard fashion for a left arm access procedure. The assistant provided retraction and mobilization to help facilitate exposure and expedite the procedure throughout the entire procedure.  This included following suture, using retractors, and optimizing lighting.  I made a curvilinear incision at the level of the antecubital fossa and dissected through the subcutaneous tissue and fascia to gain exposure of the brachial artery.  This was noted to be patent and adequate in size for fistula creation.  This was dissected out proximally and distally and prepared for control with vessel loops .  I then dissected out the cephalic vein.  This was noted  to be patent and marginal but likely adequate in size for fistula creation.  I then gave the patient 3000 units of intravenous heparin.  The vein was marked for orientation and the distal segment of the vein was ligated with a  2-0 silk, and the vein was transected.  I then interrogated the vein starting with 1.5 mm Donna Christen dilators and increasing in size up to 3.5 mm Donna Christen dilators which passed in the cephalic vein with minimal difficulty.  I then instilled the heparinized saline into the vein and clamped it.  At this point, I reset my exposure of the brachial artery and pulled up control on the vessel loops.  I made an arteriotomy with a #11 blade, and then I extended the arteriotomy with a Potts scissor.  I injected heparinized saline proximal and distal to this arteriotomy.  The vein was then sewn to the artery in an end-to-side configuration with a running stitch of 6-0 Prolene.  Prior to completing this anastomosis, I allowed the vein and artery to backbleed.  There was no evidence of clot from any vessels.  I completed the anastomosis in the usual fashion and then released all vessel loops and clamps.  There was a palpable  thrill in the venous outflow, and there was a palpable pulse in the artery distal to the anastomosis.  At this point, I irrigated out the surgical wound.  Surgicel was placed. There was no further active bleeding.  The subcutaneous tissue was reapproximated with a running stitch of 3-0 Vicryl.  The skin was then closed with a 4-0 Monocryl suture.  The skin was  then cleaned, dried, and reinforced with Dermabond.  The patient tolerated this procedure well and was taken to the recovery room in stable condition  COMPLICATIONS: None  CONDITION: Stable   Leotis Pain    08/07/2019, 12:38 PM  This note was created with Dragon Medical transcription system. Any errors in dictation are purely unintentional.

## 2019-08-07 NOTE — Anesthesia Procedure Notes (Signed)
Anesthesia Regional Block: Supraclavicular block   Pre-Anesthetic Checklist: ,, timeout performed, Correct Patient, Correct Site, Correct Laterality, Correct Procedure, Correct Position, site marked, Risks and benefits discussed,  Surgical consent,  Pre-op evaluation,  At surgeon's request and post-op pain management  Laterality: Upper and Right  Prep: chloraprep       Needles:  Injection technique: Single-shot  Needle Type: Echogenic Stimulator Needle     Needle Length: 10cm  Needle Gauge: 21     Additional Needles:   Procedures: Doppler guided,,,, ultrasound used (permanent image in chart),,,,  Narrative:  Start time: 08/07/2019 11:15 AM End time: 08/07/2019 11:20 AM Injection made incrementally with aspirations every 5 mL.  Performed by: Personally  Anesthesiologist: Alphonsus Sias, MD  Additional Notes: Functioning IV was confirmed and O2 [place]/monitors were applied. Light sedation administered as required, patient responsive throughout. A 182mm 21ga EchoStim needle was used. Sterile prep and drape,hand hygiene and sterile gloves were used.  Negative aspiration and negative test dose prior to incremental administration of local anesthetic. 1% Lidocaine for skin wheal, 4 ml. Total LA: 46ml - 0.5% Naropin/1:200 epi. U/S images stored in chart. The patient tolerated the procedure well.

## 2019-08-07 NOTE — OR Nursing (Signed)
Per blood bank, pt will only have one ABO due to difficult stick. See blood bank notes

## 2019-08-07 NOTE — H&P (Signed)
 [facility] History & Physical Update  The patient was interviewed and re-examined.  The patient's previous History and Physical has been reviewed and is unchanged.  There is no change in the plan of care. We plan to proceed with the scheduled procedure.  Leotis Pain, MD  08/07/2019, 11:05 AM

## 2019-08-07 NOTE — Transfer of Care (Signed)
Immediate Anesthesia Transfer of Care Note  Patient: Larry Morrison  Procedure(s) Performed: ARTERIOVENOUS (AV) FISTULA CREATION (Right Arm Lower)  Patient Location: PACU  Anesthesia Type:Regional  Level of Consciousness: drowsy and responds to stimulation  Airway & Oxygen Therapy: Patient Spontanous Breathing and Patient connected to face mask oxygen  Post-op Assessment: Report given to RN and Post -op Vital signs reviewed and stable  Post vital signs: Reviewed and stable  Last Vitals:  Vitals Value Taken Time  BP 96/76 08/07/19 1254  Temp 36.8 C 08/07/19 1253  Pulse 74 08/07/19 1257  Resp 21 08/07/19 1257  SpO2 100 % 08/07/19 1257  Vitals shown include unvalidated device data.  Last Pain:  Vitals:   08/07/19 1000  PainSc: 0-No pain         Complications: No apparent anesthesia complications

## 2019-08-07 NOTE — Anesthesia Preprocedure Evaluation (Addendum)
Anesthesia Evaluation  Patient identified by MRN, date of birth, ID band Patient awake    Reviewed: Allergy & Precautions, H&P , NPO status , reviewed documented beta blocker date and time   Airway Mallampati: II  TM Distance: >3 FB Neck ROM: full    Dental  (+) Poor Dentition, Chipped, Missing Very poor dentition:   Pulmonary Current Smoker and Patient abstained from smoking.,    Pulmonary exam normal        Cardiovascular hypertension, Normal cardiovascular exam  Prolonged QT on EKG, unchanged from prior   Neuro/Psych CVA    GI/Hepatic neg GERD  ,  Endo/Other    Renal/GU CRF and DialysisRenal diseaseDialysis yesterday     Musculoskeletal   Abdominal   Peds  Hematology  (+) Blood dyscrasia, anemia ,   Anesthesia Other Findings Past Medical History: No date: Anemia No date: Aneurysm ([place]) No date: Foot drop No date: History of kidney stones No date: History of nephrostomy No date: Hypertension No date: Renal disorder No date: Stroke [facility])  Past Surgical History: No date: NEPHRECTOMY No date: NEPHRECTOMY 10/27/2016: ORCHIECTOMY; Right     Comment:  Procedure: PSB ORCHIECTOMY;  Surgeon: Hollice Espy,               MD;  Location: [facility] ORS;  Service: Urology;  Laterality:               Right; 10/27/2016: ORCHIOPEXY; Bilateral     Comment:  Procedure: ORCHIOPEXY ADULT;  Surgeon: Hollice Espy,               MD;  Location: [facility] ORS;  Service: Urology;  Laterality:               Bilateral; 10/27/2016: SCROTAL EXPLORATION; Bilateral     Comment:  Procedure: SCROTUM EXPLORATION;  Surgeon: Hollice Espy, MD;  Location: [facility] ORS;  Service: Urology;                Laterality: Bilateral;     Reproductive/Obstetrics                            Anesthesia Physical Anesthesia Plan  ASA: IV  Anesthesia Plan: General   Post-op Pain Management:  Regional for Post-op pain    Induction: Intravenous  PONV Risk Score and Plan: Treatment may vary due to age or medical condition and TIVA  Airway Management Planned: Nasal Cannula and Natural Airway  Additional Equipment:   Intra-op Plan:   Post-operative Plan:   Informed Consent: I have reviewed the patients History and Physical, chart, labs and discussed the procedure including the risks, benefits and alternatives for the proposed anesthesia with the patient or authorized representative who has indicated his/her understanding and acceptance.     Dental Advisory Given  Plan Discussed with: CRNA  Anesthesia Plan Comments:         Anesthesia Quick Evaluation

## 2019-08-07 NOTE — OR Nursing (Signed)
Per Dr. Lucky Cowboy and Dr. Lavone Neri , pre op nurse is able to access perm cath on RT chest for access. Pt very difficult stick and stuck multiple times/.

## 2019-08-14 NOTE — Anesthesia Postprocedure Evaluation (Signed)
Anesthesia Post Note  Patient: Health visitor  Procedure(s) Performed: ARTERIOVENOUS (AV) FISTULA CREATION (Right Arm Lower)  Patient location during evaluation: PACU Anesthesia Type: Regional Level of consciousness: awake and alert Pain management: pain level controlled Vital Signs Assessment: post-procedure vital signs reviewed and stable Respiratory status: spontaneous breathing, nonlabored ventilation and respiratory function stable Cardiovascular status: stable and blood pressure returned to baseline Postop Assessment: no apparent nausea or vomiting Anesthetic complications: no     Last Vitals:  Vitals:   08/07/19 1405 08/07/19 1459  BP: 108/65 123/85  Pulse: 79 79  Resp: 16 16  Temp: 36.5 C   SpO2: 100% 100%    Last Pain:  Vitals:   08/07/19 1459  TempSrc:   PainSc: 0-No pain                 Alphonsus Sias

## 2019-08-20 NOTE — H&P (Signed)
See progress note from 2/23, and H&P update from day of surgery

## 2019-09-08 ENCOUNTER — Other Ambulatory Visit (INDEPENDENT_AMBULATORY_CARE_PROVIDER_SITE_OTHER): Payer: Self-pay | Admitting: Vascular Surgery

## 2019-09-08 DIAGNOSIS — I770 Arteriovenous fistula, acquired: Secondary | ICD-10-CM

## 2019-09-09 ENCOUNTER — Encounter (INDEPENDENT_AMBULATORY_CARE_PROVIDER_SITE_OTHER): Payer: Medicaid Other

## 2019-09-09 ENCOUNTER — Ambulatory Visit (INDEPENDENT_AMBULATORY_CARE_PROVIDER_SITE_OTHER): Payer: Medicaid Other | Admitting: Vascular Surgery

## 2019-09-17 ENCOUNTER — Ambulatory Visit (INDEPENDENT_AMBULATORY_CARE_PROVIDER_SITE_OTHER): Payer: Medicaid Other

## 2019-09-17 ENCOUNTER — Other Ambulatory Visit: Payer: Self-pay

## 2019-09-17 ENCOUNTER — Encounter (INDEPENDENT_AMBULATORY_CARE_PROVIDER_SITE_OTHER): Payer: Self-pay | Admitting: Nurse Practitioner

## 2019-09-17 ENCOUNTER — Ambulatory Visit (INDEPENDENT_AMBULATORY_CARE_PROVIDER_SITE_OTHER): Payer: Medicaid Other | Admitting: Nurse Practitioner

## 2019-09-17 VITALS — BP 173/93 | HR 92 | Resp 16 | Wt 116.0 lb

## 2019-09-17 DIAGNOSIS — I770 Arteriovenous fistula, acquired: Secondary | ICD-10-CM | POA: Diagnosis not present

## 2019-09-17 DIAGNOSIS — D631 Anemia in chronic kidney disease: Secondary | ICD-10-CM | POA: Insufficient documentation

## 2019-09-17 DIAGNOSIS — F17200 Nicotine dependence, unspecified, uncomplicated: Secondary | ICD-10-CM

## 2019-09-17 DIAGNOSIS — N186 End stage renal disease: Secondary | ICD-10-CM | POA: Diagnosis not present

## 2019-09-17 DIAGNOSIS — I10 Essential (primary) hypertension: Secondary | ICD-10-CM | POA: Diagnosis not present

## 2019-09-17 DIAGNOSIS — D649 Anemia, unspecified: Secondary | ICD-10-CM | POA: Insufficient documentation

## 2019-09-19 ENCOUNTER — Encounter (INDEPENDENT_AMBULATORY_CARE_PROVIDER_SITE_OTHER): Payer: Self-pay | Admitting: Nurse Practitioner

## 2019-09-19 NOTE — Progress Notes (Signed)
Subjective:    Patient ID: Larry Morrison, male    DOB: 1970-09-11, 49 y.o.   MRN: [id number] Chief Complaint  Patient presents with  . Follow-up    [facility] 5week post av fistula    Today the patient returns to the office for follow-up after right brachiocephalic AV fistula placement on 08/07/2019. The patient denies any trauma to his fistula however he does note having some hypotensive episodes during dialysis. He denies any fever, chills, nausea vomiting or diarrhea. He denies any excessive arm swelling. His wound is completely healed at this time. However, he states that the dialysis center is unable to hear his access.  Today noninvasive studies show that the patient has an occluded AV fistula at the anastomosis site. There is normal flow in the right brachial artery and radial artery suggesting occluded AV fistula.   Review of Systems  Cardiovascular:       No thrill or bruit in his fistula  Musculoskeletal: Positive for gait problem.  All other systems reviewed and are negative.      Objective:   Physical Exam Vitals reviewed.  Constitutional:      Appearance: Normal appearance.  Cardiovascular:     Rate and Rhythm: Normal rate and regular rhythm.     Pulses: Normal pulses.          Radial pulses are 2+ on the left side.     Heart sounds: Normal heart sounds.     Arteriovenous access: right arteriovenous access is present.    Comments: New right brachiocephalic AV fistula has no thrill or bruit Neurological:     Mental Status: He is alert and oriented to person, place, and time.     Coordination: Coordination abnormal.  Psychiatric:        Mood and Affect: Mood normal.        Behavior: Behavior normal.        Thought Content: Thought content normal.        Judgment: Judgment normal.     BP (!) 173/93 (BP Location: Left Arm)   Pulse 92   Resp 16   Wt 116 lb (52.6 kg)   BMI 21.22 kg/m   Past Medical History:  Diagnosis Date  . Anemia   . Aneurysm ([place])   . Foot  drop   . History of kidney stones   . History of nephrostomy   . Hypertension   . Renal disorder   . Stroke [facility])     Social History   Socioeconomic History  . Marital status: Single    Spouse name: Not on file  . Number of children: Not on file  . Years of education: Not on file  . Highest education level: Not on file  Occupational History  . Not on file  Tobacco Use  . Smoking status: Current Some Day Smoker    Packs/day: 0.50    Types: Cigarettes  . Smokeless tobacco: Never Used  Substance and Sexual Activity  . Alcohol use: No  . Drug use: Yes    Types: Marijuana  . Sexual activity: Not on file  Other Topics Concern  . Not on file  Social History Narrative  . Not on file   Social Determinants of Health   Financial Resource Strain:   . Difficulty of Paying Living Expenses:   Food Insecurity:   . Worried About Charity fundraiser in the Last Year:   . Arboriculturist in the Last Year:   [employer]  Needs:   . Lack of Transportation (Medical):   Marland Kitchen Lack of Transportation (Non-Medical):   Physical Activity:   . Days of Exercise per Week:   . Minutes of Exercise per Session:   Stress:   . Feeling of Stress :   Social Connections:   . Frequency of Communication with Friends and Family:   . Frequency of Social Gatherings with Friends and Family:   . Attends Religious Services:   . Active Member of Clubs or Organizations:   . Attends Archivist Meetings:   Marland Kitchen Marital Status:   Intimate Partner Violence:   . Fear of Current or Ex-Partner:   . Emotionally Abused:   Marland Kitchen Physically Abused:   . Sexually Abused:     Past Surgical History:  Procedure Laterality Date  . AV FISTULA PLACEMENT Right 08/07/2019   Procedure: ARTERIOVENOUS (AV) FISTULA CREATION;  Surgeon: Algernon Huxley, MD;  Location: [facility] ORS;  Service: Vascular;  Laterality: Right;  . NEPHRECTOMY    . NEPHRECTOMY    . ORCHIECTOMY Right 10/27/2016   Procedure: PSB ORCHIECTOMY;  Surgeon:  Hollice Espy, MD;  Location: [facility] ORS;  Service: Urology;  Laterality: Right;  . ORCHIOPEXY Bilateral 10/27/2016   Procedure: ORCHIOPEXY ADULT;  Surgeon: Hollice Espy, MD;  Location: [facility] ORS;  Service: Urology;  Laterality: Bilateral;  . SCROTAL EXPLORATION Bilateral 10/27/2016   Procedure: SCROTUM EXPLORATION;  Surgeon: Hollice Espy, MD;  Location: [facility] ORS;  Service: Urology;  Laterality: Bilateral;    Family History  Family history unknown: Yes    Allergies  Allergen Reactions  . No Known Allergies   . Vancomycin        Assessment & Plan:   1. ESRD (end stage renal disease) ([place]) Recommend:  Current dialysis access is clotted.  Patient should have a thrombectomy with the intention for intervention.  The intention for intervention is to restore appropriate flow and prevent possible loss of the access.  As well as improve the quality of dialysis therapy.  The risks, benefits and alternative therapies were reviewed in detail with the patient.  All questions were answered.  The patient agrees to proceed with angio/intervention.      2. Tobacco use disorder Smoking cessation was discussed, 3-10 minutes spent on this topic specifically   3. Essential hypertension Continue antihypertensive medications as already ordered, these medications have been reviewed and there are no changes at this time.    Current Outpatient Medications on File Prior to Visit  Medication Sig Dispense Refill  . aspirin EC 81 MG tablet Take 1 tablet (81 mg total) by mouth daily. 150 tablet 2  . docusate sodium (COLACE) 100 MG capsule Take 1 capsule (100 mg total) by mouth 2 (two) times daily. 60 capsule 0  . HYDROcodone-acetaminophen (NORCO) 5-325 MG tablet Take 1 tablet by mouth every 6 (six) hours as needed for moderate pain. 30 tablet 0   No current facility-administered medications on file prior to visit.    There are no Patient Instructions on file for this visit. No follow-ups on  file.   Kris Hartmann, NP

## 2019-09-24 ENCOUNTER — Telehealth (INDEPENDENT_AMBULATORY_CARE_PROVIDER_SITE_OTHER): Payer: Self-pay

## 2019-09-24 NOTE — Telephone Encounter (Signed)
Larry Morrison from [facility] called wanting to know what is next to be scheduled for the patient. After reading the last note the patient is now schedule with Dr. Lucky Morrison for a right arm thrombectomy on 10/06/19 with a 11:00 am arrival time to the [facility]. Patient will do covid testing on 10/02/19 between 8-1 pm at the [facility]. Pre-procedure instructions will be faxed to [facility] at [facility].

## 2019-09-30 ENCOUNTER — Telehealth (INDEPENDENT_AMBULATORY_CARE_PROVIDER_SITE_OTHER): Payer: Self-pay | Admitting: Vascular Surgery

## 2019-09-30 NOTE — Telephone Encounter (Signed)
A detailed message has been left on the patient voicemail

## 2019-09-30 NOTE — Telephone Encounter (Signed)
We do not treat bladder bags or bladder infections.  Dr. Lucky Cowboy deals with fistula access and that is all.  He will need to see his PCP

## 2019-10-02 ENCOUNTER — Other Ambulatory Visit
Admission: RE | Admit: 2019-10-02 | Discharge: 2019-10-02 | Disposition: A | Discharge status: Home / Self Care | Payer: Medicaid Other | Source: Ambulatory Visit | Attending: Vascular Surgery | Admitting: Vascular Surgery

## 2019-10-02 ENCOUNTER — Other Ambulatory Visit: Payer: Self-pay

## 2019-10-02 DIAGNOSIS — Z01812 Encounter for preprocedural laboratory examination: Secondary | ICD-10-CM | POA: Insufficient documentation

## 2019-10-02 DIAGNOSIS — Z20822 Contact with and (suspected) exposure to covid-19: Secondary | ICD-10-CM | POA: Diagnosis not present

## 2019-10-02 LAB — SARS CORONAVIRUS 2 (TAT 6-24 HRS): SARS Coronavirus 2: NEGATIVE

## 2019-10-03 ENCOUNTER — Other Ambulatory Visit (INDEPENDENT_AMBULATORY_CARE_PROVIDER_SITE_OTHER): Payer: Self-pay | Admitting: Nurse Practitioner

## 2019-10-06 ENCOUNTER — Encounter: Payer: Self-pay | Admitting: Anesthesiology

## 2019-10-06 ENCOUNTER — Ambulatory Visit: Admission: RE | Admit: 2019-10-06 | Payer: Medicaid Other | Source: Home / Self Care | Admitting: Vascular Surgery

## 2019-10-06 DIAGNOSIS — N186 End stage renal disease: Secondary | ICD-10-CM

## 2019-10-06 SURGERY — PERIPHERAL VASCULAR THROMBECTOMY
Anesthesia: General | Laterality: Right

## 2019-10-07 ENCOUNTER — Telehealth (INDEPENDENT_AMBULATORY_CARE_PROVIDER_SITE_OTHER): Payer: Self-pay

## 2019-10-07 NOTE — Telephone Encounter (Signed)
Spoke with Larry Morrison at [facility] and the patient has been rescheduled for his thrombectomy with Dr. Lucky Cowboy on 10/13/19 with a 11:00 am arrival time to the [facility]. Patient will do a covid test on 10/09/19 between 8-1 pm at the [facility]. Pre-procedure instructions will be faxed to Larry Morrison's attention at [facility].

## 2019-10-09 ENCOUNTER — Other Ambulatory Visit
Admission: RE | Admit: 2019-10-09 | Discharge: 2019-10-09 | Disposition: A | Discharge status: Home / Self Care | Payer: Medicaid Other | Source: Ambulatory Visit | Attending: Vascular Surgery | Admitting: Vascular Surgery

## 2019-10-09 DIAGNOSIS — Z01812 Encounter for preprocedural laboratory examination: Secondary | ICD-10-CM | POA: Insufficient documentation

## 2019-10-09 DIAGNOSIS — Z20822 Contact with and (suspected) exposure to covid-19: Secondary | ICD-10-CM | POA: Insufficient documentation

## 2019-10-10 LAB — SARS CORONAVIRUS 2 (TAT 6-24 HRS): SARS Coronavirus 2: NEGATIVE

## 2019-10-13 ENCOUNTER — Other Ambulatory Visit: Payer: Self-pay

## 2019-10-13 ENCOUNTER — Ambulatory Visit
Admission: RE | Admit: 2019-10-13 | Discharge: 2019-10-13 | Disposition: A | Discharge status: Home / Self Care | Payer: Medicaid Other | Attending: Vascular Surgery | Admitting: Vascular Surgery

## 2019-10-13 ENCOUNTER — Ambulatory Visit: Payer: Medicaid Other | Admitting: Registered Nurse

## 2019-10-13 ENCOUNTER — Other Ambulatory Visit (INDEPENDENT_AMBULATORY_CARE_PROVIDER_SITE_OTHER): Payer: Self-pay | Admitting: Nurse Practitioner

## 2019-10-13 ENCOUNTER — Encounter: Payer: Self-pay | Admitting: Vascular Surgery

## 2019-10-13 ENCOUNTER — Encounter
Admission: RE | Disposition: A | Discharge status: Home / Self Care | Payer: Self-pay | Source: Home / Self Care | Attending: Vascular Surgery

## 2019-10-13 DIAGNOSIS — N186 End stage renal disease: Secondary | ICD-10-CM

## 2019-10-13 DIAGNOSIS — Z539 Procedure and treatment not carried out, unspecified reason: Secondary | ICD-10-CM | POA: Insufficient documentation

## 2019-10-13 LAB — URINE DRUG SCREEN, QUALITATIVE (ARMC ONLY)
Amphetamines, Ur Screen: NOT DETECTED
Barbiturates, Ur Screen: NOT DETECTED
Benzodiazepine, Ur Scrn: NOT DETECTED
Cannabinoid 50 Ng, Ur ~~LOC~~: POSITIVE — AB
Cocaine Metabolite,Ur ~~LOC~~: POSITIVE — AB
MDMA (Ecstasy)Ur Screen: NOT DETECTED
Methadone Scn, Ur: NOT DETECTED
Opiate, Ur Screen: NOT DETECTED
Phencyclidine (PCP) Ur S: NOT DETECTED
Tricyclic, Ur Screen: NOT DETECTED

## 2019-10-13 LAB — POTASSIUM (ARMC VASCULAR LAB ONLY): Potassium (ARMC vascular lab): 3.9 (ref 3.5–5.1)

## 2019-10-13 SURGERY — PERIPHERAL VASCULAR THROMBECTOMY
Anesthesia: General | Laterality: Right

## 2019-10-13 MED ORDER — ONDANSETRON HCL 4 MG/2ML IJ SOLN
INTRAMUSCULAR | Status: AC
  Filled 2019-10-13: qty 2

## 2019-10-13 MED ORDER — MIDAZOLAM HCL 2 MG/ML PO SYRP: 8.0000 mg | ORAL_SOLUTION | Freq: Once | ORAL | Status: DC | PRN

## 2019-10-13 MED ORDER — DEXAMETHASONE SODIUM PHOSPHATE 10 MG/ML IJ SOLN
INTRAMUSCULAR | Status: AC
  Filled 2019-10-13: qty 1

## 2019-10-13 MED ORDER — SODIUM CHLORIDE (PF) 0.9 % IJ SOLN
INTRAMUSCULAR | Status: AC
  Filled 2019-10-13: qty 10

## 2019-10-13 MED ORDER — EPHEDRINE 5 MG/ML INJ
INTRAVENOUS | Status: AC
  Filled 2019-10-13: qty 10

## 2019-10-13 MED ORDER — CEFAZOLIN SODIUM-DEXTROSE 1-4 GM/50ML-% IV SOLN: 1.0000 g | Freq: Once | INTRAVENOUS | Status: DC

## 2019-10-13 MED ORDER — METHYLPREDNISOLONE SODIUM SUCC 125 MG IJ SOLR: 125.0000 mg | Freq: Once | INTRAMUSCULAR | Status: DC | PRN

## 2019-10-13 MED ORDER — MIDAZOLAM HCL 2 MG/2ML IJ SOLN
INTRAMUSCULAR | Status: AC
  Filled 2019-10-13: qty 2

## 2019-10-13 MED ORDER — CEFAZOLIN SODIUM-DEXTROSE 1-4 GM/50ML-% IV SOLN
INTRAVENOUS | Status: AC
  Filled 2019-10-13: qty 50

## 2019-10-13 MED ORDER — SODIUM CHLORIDE 0.9 % IV SOLN: INTRAVENOUS | Status: DC

## 2019-10-13 MED ORDER — KETAMINE HCL 50 MG/ML IJ SOLN
INTRAMUSCULAR | Status: AC
  Filled 2019-10-13: qty 10

## 2019-10-13 MED ORDER — HYDROMORPHONE HCL 1 MG/ML IJ SOLN: 1.0000 mg | Freq: Once | INTRAMUSCULAR | Status: DC | PRN

## 2019-10-13 MED ORDER — ONDANSETRON HCL 4 MG/2ML IJ SOLN: 4.0000 mg | Freq: Four times a day (QID) | INTRAMUSCULAR | Status: DC | PRN

## 2019-10-13 MED ORDER — DIPHENHYDRAMINE HCL 50 MG/ML IJ SOLN: 50.0000 mg | Freq: Once | INTRAMUSCULAR | Status: DC | PRN

## 2019-10-13 MED ORDER — FAMOTIDINE 20 MG PO TABS: 40.0000 mg | ORAL_TABLET | Freq: Once | ORAL | Status: DC | PRN

## 2019-10-13 MED ORDER — GLYCOPYRROLATE 0.2 MG/ML IJ SOLN
INTRAMUSCULAR | Status: AC
  Filled 2019-10-13: qty 1

## 2019-10-13 MED ORDER — FENTANYL CITRATE (PF) 100 MCG/2ML IJ SOLN
INTRAMUSCULAR | Status: AC
  Filled 2019-10-13: qty 2

## 2019-10-13 NOTE — Anesthesia Preprocedure Evaluation (Deleted)
Anesthesia Evaluation  Patient identified by MRN, date of birth, ID band Patient awake    Reviewed: Allergy & Precautions, NPO status , Patient's Chart, lab work & pertinent test results  History of Anesthesia Complications Negative for: history of anesthetic complications  Airway Mallampati: II  TM Distance: >3 FB Neck ROM: Full    Dental  (+) Poor Dentition, Missing   Pulmonary neg sleep apnea, neg COPD, Current Smoker and Patient abstained from smoking.,    breath sounds clear to auscultation- rhonchi (-) wheezing      Cardiovascular hypertension, Pt. on medications (-) CAD, (-) Past MI, (-) Cardiac Stents and (-) CABG  Rhythm:Regular Rate:Normal - Systolic murmurs and - Diastolic murmurs    Neuro/Psych neg Seizures CVA, No Residual Symptoms negative psych ROS   GI/Hepatic negative GI ROS, Neg liver ROS,   Endo/Other  negative endocrine ROSneg diabetes  Renal/GU ESRF and DialysisRenal disease     Musculoskeletal negative musculoskeletal ROS (+)   Abdominal (+) - obese,   Peds  Hematology  (+) anemia ,   Anesthesia Other Findings Past Medical History: No date: Anemia No date: Aneurysm ([place]) No date: Foot drop No date: History of kidney stones No date: History of nephrostomy No date: Hypertension No date: Renal disorder No date: Stroke [facility])   Reproductive/Obstetrics                             Anesthesia Physical Anesthesia Plan  ASA: IV  Anesthesia Plan: General   Post-op Pain Management:    Induction: Intravenous  PONV Risk Score and Plan: 0 and Ondansetron  Airway Management Planned: LMA  Additional Equipment:   Intra-op Plan:   Post-operative Plan:   Informed Consent: I have reviewed the patients History and Physical, chart, labs and discussed the procedure including the risks, benefits and alternatives for the proposed anesthesia with the patient or authorized  representative who has indicated his/her understanding and acceptance.     Dental advisory given  Plan Discussed with: CRNA and Anesthesiologist  Anesthesia Plan Comments:         Anesthesia Quick Evaluation

## 2019-11-04 ENCOUNTER — Ambulatory Visit (INDEPENDENT_AMBULATORY_CARE_PROVIDER_SITE_OTHER): Payer: Medicaid Other | Admitting: Vascular Surgery

## 2019-11-04 ENCOUNTER — Other Ambulatory Visit: Payer: Self-pay

## 2019-11-04 ENCOUNTER — Encounter (INDEPENDENT_AMBULATORY_CARE_PROVIDER_SITE_OTHER): Payer: Self-pay | Admitting: Vascular Surgery

## 2019-11-04 VITALS — BP 157/94 | HR 92 | Resp 16 | Wt 111.4 lb

## 2019-11-04 DIAGNOSIS — I10 Essential (primary) hypertension: Secondary | ICD-10-CM | POA: Diagnosis not present

## 2019-11-04 DIAGNOSIS — N186 End stage renal disease: Secondary | ICD-10-CM

## 2019-11-04 NOTE — Progress Notes (Signed)
MRN : [id number]  Larry Morrison is a 49 y.o. ([birth year]) male who presents with chief complaint of  Chief Complaint  Patient presents with  . Follow-up  .  History of Present Illness: Patient returns today in follow up of his dialysis access.  About 3 months ago, he underwent a right brachiocephalic AV fistula which failed to mature.  He was noted to be occluded on his postoperative visit and we scheduled him for attempt at thrombectomy to salvage the fistula on multiple occasions following this visit.  He was positive for cocaine and could not have a thrombectomy done and at this point it is clear that his fistula would no longer be salvageable.  He is here today to discuss further dialysis access options.  He wants to stay in his right arm.  He is most concerned about what kind of pain medication is going to get after his procedure.  Current Outpatient Medications  Medication Sig Dispense Refill  . carvedilol (COREG) 6.25 MG tablet Take 6.25 mg by mouth 2 (two) times daily.    Marland Kitchen HYDROcodone-acetaminophen (NORCO) 5-325 MG tablet Take 1 tablet by mouth every 6 (six) hours as needed for moderate pain. 30 tablet 0  . sevelamer carbonate (RENVELA) 0.8 g PACK packet Take by mouth.    Marland Kitchen aspirin EC 81 MG tablet Take 1 tablet (81 mg total) by mouth daily. (Patient not taking: Reported on 11/04/2019) 150 tablet 2  . docusate sodium (COLACE) 100 MG capsule Take 1 capsule (100 mg total) by mouth 2 (two) times daily. (Patient not taking: Reported on 11/04/2019) 60 capsule 0   No current facility-administered medications for this visit.    Past Medical History:  Diagnosis Date  . Anemia   . Aneurysm ([place])   . Foot drop   . History of kidney stones   . History of nephrostomy   . Hypertension   . Renal disorder   . Stroke [facility])     Past Surgical History:  Procedure Laterality Date  . AV FISTULA PLACEMENT Right 08/07/2019   Procedure: ARTERIOVENOUS (AV) FISTULA CREATION;  Surgeon: Algernon Huxley,  MD;  Location: [facility] ORS;  Service: Vascular;  Laterality: Right;  . NEPHRECTOMY    . NEPHRECTOMY    . ORCHIECTOMY Right 10/27/2016   Procedure: PSB ORCHIECTOMY;  Surgeon: Hollice Espy, MD;  Location: [facility] ORS;  Service: Urology;  Laterality: Right;  . ORCHIOPEXY Bilateral 10/27/2016   Procedure: ORCHIOPEXY ADULT;  Surgeon: Hollice Espy, MD;  Location: [facility] ORS;  Service: Urology;  Laterality: Bilateral;  . SCROTAL EXPLORATION Bilateral 10/27/2016   Procedure: SCROTUM EXPLORATION;  Surgeon: Hollice Espy, MD;  Location: [facility] ORS;  Service: Urology;  Laterality: Bilateral;     Social History   Tobacco Use  . Smoking status: Current Some Day Smoker    Packs/day: 0.50    Types: Cigarettes  . Smokeless tobacco: Never Used  Vaping Use  . Vaping Use: Never used  Substance Use Topics  . Alcohol use: No  . Drug use: Yes    Types: Marijuana      Family History  Family history unknown: Yes     Allergies  Allergen Reactions  . No Known Allergies   . Vancomycin      REVIEW OF SYSTEMS (Negative unless checked)  Constitutional: [] Weight loss  [] Fever  [] Chills Cardiac: [] Chest pain   [] Chest pressure   [] Palpitations   [] Shortness of breath when laying flat   [] Shortness of breath at rest   []   Shortness of breath with exertion. Vascular:  [] Pain in legs with walking   [] Pain in legs at rest   [] Pain in legs when laying flat   [] Claudication   [] Pain in feet when walking  [] Pain in feet at rest  [] Pain in feet when laying flat   [] History of DVT   [] Phlebitis   [] Swelling in legs   [] Varicose veins   [] Non-healing ulcers Pulmonary:   [] Uses home oxygen   [] Productive cough   [] Hemoptysis   [] Wheeze  [] COPD   [] Asthma Neurologic:  [] Dizziness  [] Blackouts   [] Seizures   [x] History of stroke   [] History of TIA  [] Aphasia   [] Temporary blindness   [] Dysphagia   [] Weakness or numbness in arms   [x] Weakness or numbness in legs Musculoskeletal:  [x] Arthritis   [] Joint swelling   [] Joint  pain   [] Low back pain Hematologic:  [] Easy bruising  [] Easy bleeding   [] Hypercoagulable state   [] Anemic   Gastrointestinal:  [] Blood in stool   [] Vomiting blood  [] Gastroesophageal reflux/heartburn   [] Abdominal pain Genitourinary:  [x] Chronic kidney disease   [] Difficult urination  [] Frequent urination  [] Burning with urination   [] Hematuria Skin:  [] Rashes   [] Ulcers   [] Wounds Psychological:  [] History of anxiety   []  History of major depression.  Physical Examination  BP (!) 157/94   Pulse 92   Resp 16   Wt 111 lb 6.4 oz (50.5 kg)   BMI 20.38 kg/m  Gen:  WD/WN, NAD Head: Debilitated and appears older than stated age Ear/Nose/Throat: Hearing grossly intact, nares w/o erythema or drainage Eyes: Conjunctiva clear. Sclera non-icteric Neck: Supple.  Trachea midline Pulmonary:  Good air movement, no use of accessory muscles.  Cardiac: RRR, no JVD Vascular: Right brachiocephalic incision has healed.  No thrill is palpable within the fistula. Vessel Right Left  Radial Palpable Palpable                   Musculoskeletal: Has foot drop and walks with a Cochrane.  Unsteady gait. Neurologic: Sensation grossly intact in extremities.  Symmetrical.  Speech is difficult to understand Psychiatric: Judgment intact, Mood & affect appropriate for pt's clinical situation. Dermatologic: No rashes or ulcers noted.  No cellulitis or open wounds.       Labs Recent Results (from the past 2160 hour(s))  Urine Drug Screen, Qualitative ([place] only)     Status: Abnormal   Collection Time: 08/07/19  9:56 AM  Result Value Ref Range   Tricyclic, Ur Screen NONE DETECTED NONE DETECTED   Amphetamines, Ur Screen NONE DETECTED NONE DETECTED   MDMA (Ecstasy)Ur Screen NONE DETECTED NONE DETECTED   Cocaine Metabolite,Ur [place] POSITIVE (A) NONE DETECTED   Opiate, Ur Screen NONE DETECTED NONE DETECTED   Phencyclidine (PCP) Ur S NONE DETECTED NONE DETECTED   Cannabinoid 50 Ng, Ur [place] POSITIVE (A) NONE  DETECTED   Barbiturates, Ur Screen NONE DETECTED NONE DETECTED   Benzodiazepine, Ur Scrn NONE DETECTED NONE DETECTED   Methadone Scn, Ur NONE DETECTED NONE DETECTED    Comment: (NOTE) Tricyclics + metabolites, urine    Cutoff 1000 ng/mL Amphetamines + metabolites, urine  Cutoff 1000 ng/mL MDMA (Ecstasy), urine              Cutoff 500 ng/mL Cocaine Metabolite, urine          Cutoff 300 ng/mL Opiate + metabolites, urine        Cutoff 300 ng/mL Phencyclidine (PCP), urine  Cutoff 25 ng/mL Cannabinoid, urine                 Cutoff 50 ng/mL Barbiturates + metabolites, urine  Cutoff 200 ng/mL Benzodiazepine, urine              Cutoff 200 ng/mL Methadone, urine                   Cutoff 300 ng/mL The urine drug screen provides only a preliminary, unconfirmed analytical test result and should not be used for non-medical purposes. Clinical consideration and professional judgment should be applied to any positive drug screen result due to possible interfering substances. A more specific alternate chemical method must be used in order to obtain a confirmed analytical result. Gas chromatography / mass spectrometry (GC/MS) is the preferred confirmat ory method. Performed at [facility], [address] [place], [place] [address]   ABO/Rh     Status: None   Collection Time: 08/07/19 10:07 AM  Result Value Ref Range   ABO/RH(D)      B POS Performed at [facility], [address] [place], [place] [address]   C[place]on [place], [place] [address]     Status: Abnormal   Collection Time: 08/07/19 10:18 AM  Result Value Ref Range   Sodium 138 135 - 145 mmol/L   Potassium 4.3 3.5 - 5.1 mmol/L   Chloride 104 98 - 111 mmol/L   BUN 21 (H) 6 - 20 mg/dL   Creatinine, Ser 8.00 (H) 0.61 - 1.24 mg/dL   Glucose, Bld 67 (L) 70 - 99 mg/dL    Comment: Glucose reference range applies only to samples taken after fasting for at least 8 hours.   Calcium, Ion 0.76 (LL) 1.15 - 1.40 mmol/L   TCO2 27 22 - 32  mmol/L   Hemoglobin 12.6 (L) 13.0 - 17.0 g/dL   HCT 37.0 (L) 39 - 52 %   Comment NOTIFIED PHYSICIAN   SARS CORONAVIRUS 2 (TAT 6-24 HRS) Nasopharyngeal Nasopharyngeal Swab     Status: None   Collection Time: 10/02/19  9:01 AM   Specimen: Nasopharyngeal Swab  Result Value Ref Range   SARS Coronavirus 2 NEGATIVE NEGATIVE    Comment: (NOTE) SARS-CoV-2 target nucleic acids are NOT DETECTED. The SARS-CoV-2 RNA is generally detectable in upper and lower respiratory specimens during the acute phase of infection. Negative results do not preclude SARS-CoV-2 infection, do not rule out co-infections with other pathogens, and should not be used as the sole basis for treatment or other patient management decisions. Negative results must be combined with clinical observations, patient history, and epidemiological information. The expected result is Negative. Fact Sheet for Patients: SugarRoll.be Fact Sheet for Healthcare Providers: https://www.woods-mathews.com/ This test is not yet approved or cleared by the [place] FDA and  has been authorized for detection and/or diagnosis of SARS-CoV-2 by FDA under an Emergency Use Authorization (EUA). This EUA will remain  in effect (meaning this test can be used) for the duration of the COVID-19 declaration under Section 56 4(b)(1) of the Act, 21 U.S.C. section 360bbb-3(b)(1), unless the authorization is terminated or revoked sooner. Performed at [facility], [place] [address], [place], [place] [address]   SARS CORONAVIRUS 2 (TAT 6-24 HRS) Nasopharyngeal Nasopharyngeal Swab     Status: None   Collection Time: 10/09/19 12:02 PM   Specimen: Nasopharyngeal Swab  Result Value Ref Range   SARS Coronavirus 2 NEGATIVE NEGATIVE    Comment: (NOTE) SARS-CoV-2 target nucleic acids are  NOT DETECTED. The SARS-CoV-2 RNA is generally detectable in upper and lower respiratory specimens during the acute phase of  infection. Negative results do not preclude SARS-CoV-2 infection, do not rule out co-infections with other pathogens, and should not be used as the sole basis for treatment or other patient management decisions. Negative results must be combined with clinical observations, patient history, and epidemiological information. The expected result is Negative. Fact Sheet for Patients: SugarRoll.be Fact Sheet for Healthcare Providers: https://www.woods-mathews.com/ This test is not yet approved or cleared by the [place] FDA and  has been authorized for detection and/or diagnosis of SARS-CoV-2 by FDA under an Emergency Use Authorization (EUA). This EUA will remain  in effect (meaning this test can be used) for the duration of the COVID-19 declaration under Section 56 4(b)(1) of the Act, 21 U.S.C. section 360bbb-3(b)(1), unless the authorization is terminated or revoked sooner. Performed at [facility], [place] [address], [place], [place] [address]   Urine Drug Screen, Qualitative ([place] only)     Status: Abnormal   Collection Time: 10/13/19 12:06 PM  Result Value Ref Range   Tricyclic, Ur Screen NONE DETECTED NONE DETECTED   Amphetamines, Ur Screen NONE DETECTED NONE DETECTED   MDMA (Ecstasy)Ur Screen NONE DETECTED NONE DETECTED   Cocaine Metabolite,Ur [place] POSITIVE (A) NONE DETECTED   Opiate, Ur Screen NONE DETECTED NONE DETECTED   Phencyclidine (PCP) Ur S NONE DETECTED NONE DETECTED   Cannabinoid 50 Ng, Ur [place] POSITIVE (A) NONE DETECTED   Barbiturates, Ur Screen NONE DETECTED NONE DETECTED   Benzodiazepine, Ur Scrn NONE DETECTED NONE DETECTED   Methadone Scn, Ur NONE DETECTED NONE DETECTED    Comment: (NOTE) Tricyclics + metabolites, urine    Cutoff 1000 ng/mL Amphetamines + metabolites, urine  Cutoff 1000 ng/mL MDMA (Ecstasy), urine              Cutoff 500 ng/mL Cocaine Metabolite, urine          Cutoff 300 ng/mL Opiate + metabolites,  urine        Cutoff 300 ng/mL Phencyclidine (PCP), urine         Cutoff 25 ng/mL Cannabinoid, urine                 Cutoff 50 ng/mL Barbiturates + metabolites, urine  Cutoff 200 ng/mL Benzodiazepine, urine              Cutoff 200 ng/mL Methadone, urine                   Cutoff 300 ng/mL The urine drug screen provides only a preliminary, unconfirmed analytical test result and should not be used for non-medical purposes. Clinical consideration and professional judgment should be applied to any positive drug screen result due to possible interfering substances. A more specific alternate chemical method must be used in order to obtain a confirmed analytical result. Gas chromatography / mass spectrometry (GC/MS) is the preferred confirmat ory method. Performed at [facility], [address] [place], [place] [address]   Potassium [facility] vascular lab only)     Status: None   Collection Time: 10/13/19 12:57 PM  Result Value Ref Range   Potassium [facility] vascular lab) 3.9 3.5 - 5.1    Comment: Performed at [facility], [address] [place], [place] [address]    Radiology No results found.  Assessment/Plan Essential hypertension That as well as his previous urologic issues are likely underlying cause of his renal failure. blood pressure  control important in reducing the progression of atherosclerotic disease. On appropriate oral medications.  ESRD (end stage renal disease) ([place]) The patient had a right brachiocephalic AV fistula which thrombosed and he was cocaine positive precluding any attempts at salvaging this.  This is clearly nonsalvageable at this point.  I would recommend a new access and at this point an AV graft would be planned.  He would like to keep this in his right arm which is certainly fine.  We will schedule a right brachial artery to axillary vein AV graft in the near future at his convenience.    Leotis Pain, MD  11/04/2019 5:15 PM    This  note was created with Dragon medical transcription system.  Any errors from dictation are purely unintentional

## 2019-11-04 NOTE — Assessment & Plan Note (Signed)
The patient had a right brachiocephalic AV fistula which thrombosed and he was cocaine positive precluding any attempts at salvaging this.  This is clearly nonsalvageable at this point.  I would recommend a new access and at this point an AV graft would be planned.  He would like to keep this in his right arm which is certainly fine.  We will schedule a right brachial artery to axillary vein AV graft in the near future at his convenience.

## 2019-11-04 NOTE — Patient Instructions (Signed)
Vascular Access for Hemodialysis        A vascular access is a connection to the blood inside your blood vessels that allows blood to be easily removed from your body and returned to your body during kidney dialysis (hemodialysis). Hemodialysis is a procedure in which a machine outside of the body filters the blood of a person whose kidneys are no longer working properly. There are three types of vascular accesses:  Arteriovenous fistula (AVF). This is a connection between an artery and a vein (usually in the arm) that is made by sewing them together. Blood in the artery flows directly into the vein, causing it to get larger over time. This makes it easier for the vein to be used for hemodialysis. An arteriovenous fistula takes 1-6 months to develop after surgery.  Arteriovenous graft (AVG). This is a connection between an artery and a vein in the arm that is made with a tube. An arteriovenous graft can be used within 2-3 weeks of surgery.  Venous catheter. This is a thin, flexible tube that is placed in a large vein (usually in the neck, chest, or groin). A venous catheter for hemodialysis contains two tubes that come out of the skin. A venous catheter can be used right away. It is usually used as a temporary access if you need hemodialysis before a fistula or graft has developed, or if kidney failure is sudden (acute) and likely to improve without the need for long-term dialysis. It may also be used as a permanent access if a fistula or graft cannot be created. Which type of access is best for me? The type of access that is best for you depends on the size and strength of your veins, your age, and any other health problems that you have, such as diabetes. An ultrasound test may be used to look at your veins to help make this decision. A fistula is usually the preferred type of access. It can last several years and is less likely than the other types of accesses to become infected or to cause a blood  clot within a blood vessel (thrombosis). However, a fistula is not an option for everyone. If your veins are not the right size or if the fistula does not develop properly, a graft may be used instead. Grafts require you to have strong veins. If your veins are not strong enough for a graft, a catheter may be used. Catheters are more likely than fistulas and grafts to become infected or to have a thrombosis. Sometimes, only one type of access is an option. Your health care provider will help you determine which type of access is best for you. How is a vascular access used? The way that the access is used depends on the type of access:  If the access is a fistula or graft, two needles are inserted through the skin into the access before each hemodialysis session. Blood leaves the body through one of the needles and travels through a tube to the hemodialysis machine (dialyzer). Then it flows through another tube and returns to the body through the second needle.  If the access is a catheter, one tube is connected directly to the tube that leads to the dialyzer, and the other tube is connected to a tube that leads away from the dialyzer. Blood leaves the body through one tube and returns to the body through the other. What problems can occur with vascular access?  A blood clot within a blood vessel (thrombosis).  Thrombosis can lead to a narrowing of a blood vessel (stenosis). If thrombosis occurs frequently, another access site may be created as a backup.  Infection.  Heart enlargement (cardiomegaly) and heart failure. Changes in blood flow may cause an increase in blood pressure or heart rate, making your heart work harder to pump blood. These problems are most likely to occur with a venous catheter and least likely to occur with an arteriovenous fistula. How do I care for my vascular access?  Wear a medical alert bracelet. In case of an emergency, this bracelet tells health care providers that you  are a dialysis patient and allows them to care for your veins appropriately. If you have a graft or fistula:  A "bruit" is a noise that is heard with a stethoscope, and a "thrill" is a vibration that is felt over the graft or fistula. The presence of the bruit and thrill indicates that the access is working. You will be taught to feel for the thrill each day. If this is not felt, the access may be clotted. Call your health care provider.  Keep your arm straight and raised (elevated) above your heart while the access site is healing.  You may freely use the arm where your vascular access is located after the site heals. Keep the following in mind: ? Avoid pressure on the arm. ? Avoid lifting heavy objects with the arm. ? Avoid sleeping on the arm. ? Avoid wearing tight-sleeved shirts or jewelry around the graft or fistula.  Do not allow blood pressure monitoring or needle punctures on the side where the graft or fistula is located.  With permission from your health care provider, you may do exercises to help with blood flow through a fistula. These exercises involve squeezing a rubber ball or other soft objects as instructed.  Wash your access site according to directions from your health care provider. If you have a venous catheter:  Keep the insertion site clean and dry at all times.  If you are told you can shower after the site heals, use a protective covering over the catheter to keep it dry.  Follow directions from your health care provider for bandage (dressing) changes.  Ask your health care provider what activities are safe for you. You may be restricted from lifting or making repetitive arm movements on the side with the catheter. Contact a health care provider if:  Swelling around the graft or fistula gets worse.  You develop new pain.  Your catheter gets damaged. Get help right away if:  You have pain, numbness, an unusual pale skin color, or blue fingers or sores at  the tips of your fingers in the hand on the side of your fistula.  You have chills.  You have a fever.  You have pus or other fluid (drainage) at the vascular access site.  You develop skin redness or red streaking on the skin around, above, or below the vascular access.  You have bleeding at the vascular access that cannot be easily controlled.  Your catheter gets pulled out of place.  You feel your heart racing or skipping beats.  You have chest pain. Summary  A vascular access is a connection to the blood inside your blood vessels that allows blood to be easily removed from your body and returned to your body during kidney dialysis (hemodialysis).  There are three types of vascular accesses.The type of access that is best for you depends on the size and strength of your  veins, your age, and any other health problems that you have, such as diabetes.  A fistula is usually the preferred type of access, although it is not an option for everyone. It can last several years and is less likely than the other types of accesses to become infected or to cause a blood clot within a blood vessel (thrombosis).  Wear a medical alert bracelet. In case of an emergency, this tells health care providers that you are a dialysis patient. This information is not intended to replace advice given to you by your health care provider. Make sure you discuss any questions you have with your health care provider. Document Revised: 08/27/2018 Document Reviewed: 06/02/2016 Elsevier Patient Education  [place].

## 2019-11-06 ENCOUNTER — Telehealth (INDEPENDENT_AMBULATORY_CARE_PROVIDER_SITE_OTHER): Payer: Self-pay

## 2019-11-06 NOTE — Telephone Encounter (Signed)
Spoke with Demetria at [facility] and the patient is scheduled with Dr. Lucky Cowboy for a Right arm AVG on 11/20/19. Patient will do a phone call pre-op on 11/12/19 between 8-1 pm and covid testing on 11/18/19 between 8-1 pm at the [facility]. Pre-surgical instructions will be faxed to Lujean Rave to [facility] and mailed to patient.

## 2019-11-10 ENCOUNTER — Other Ambulatory Visit (INDEPENDENT_AMBULATORY_CARE_PROVIDER_SITE_OTHER): Payer: Self-pay | Admitting: Nurse Practitioner

## 2019-11-12 ENCOUNTER — Inpatient Hospital Stay
Admission: RE | Admit: 2019-11-12 | Discharge: 2019-11-12 | Disposition: A | Discharge status: Home / Self Care | Payer: Medicaid Other | Source: Ambulatory Visit

## 2019-11-13 ENCOUNTER — Other Ambulatory Visit: Payer: Self-pay

## 2019-11-13 ENCOUNTER — Encounter
Admission: RE | Admit: 2019-11-13 | Discharge: 2019-11-13 | Disposition: A | Discharge status: Home / Self Care | Payer: Medicaid Other | Source: Ambulatory Visit | Attending: Vascular Surgery | Admitting: Vascular Surgery

## 2019-11-13 HISTORY — DX: Dyspnea, unspecified: R06.00

## 2019-11-13 MED ORDER — FENTANYL CITRATE (PF) 100 MCG/2ML IJ SOLN: 25.0000 ug | INTRAMUSCULAR | Status: DC | PRN

## 2019-11-13 MED ORDER — ONDANSETRON HCL 4 MG/2ML IJ SOLN: 4.0000 mg | Freq: Once | INTRAMUSCULAR | Status: DC | PRN

## 2019-11-13 NOTE — Patient Instructions (Addendum)
Your procedure is scheduled OH:YWVPX. 7/1  Report to Day Surgery. To find out your arrival time please call [phone] between [place] on Wed. 6/30.  Remember: Instructions that are not followed completely may result in serious medical risk,  up to and including death, or upon the discretion of your surgeon and anesthesiologist your  surgery may need to be rescheduled.     _X__ 1. Do not eat food after midnight the night before your procedure.                 No gum chewing or hard candies. You may drink clear liquids up to 2 hours                 before you are scheduled to arrive for your surgery- DO not drink clear                 liquids within 2 hours of the start of your surgery.                 Clear Liquids include:  water, apple juice without pulp, clear Gatorade, G2 or                  Gatorade Zero (avoid Red/Purple/Blue), Black Coffee or Tea (Do not add                 anything to coffee or tea). _____2.   Complete the carbohydrate drink provided to you, 2 hours before arrival.  __X__2.  On the morning of surgery brush your teeth with toothpaste and water, you                may rinse your mouth with mouthwash if you wish.  Do not swallow any toothpaste of mouthwash.     _X__ 3.  No Alcohol for 24 hours before or after surgery.   _X__ 4.  Do Not Smoke or use e-cigarettes For 24 Hours Prior to Your Surgery.                 Do not use any chewable tobacco products for at least 6 hours prior to                 Surgery.  _X__  5.  Do not use any recreational drugs (marijuana, cocaine, heroin, ecstacy, MDMA or other)                For at least one week prior to your surgery.  Combination of these drugs with anesthesia                May have life threatening results.  ____  6.  Bring all medications with you on the day of surgery if instructed.   __x__  7.  Notify your doctor if there is any change in your medical condition      (cold, fever,  infections).     Do not wear jewelry,  Do not wear lotions, powders, or perfumes.  Do not shave 48 hours prior to surgery. Men may shave face and neck. Do not bring valuables to the hospital.    [facility] is not responsible for any belongings or valuables.  Contacts, dentures or bridgework may not be worn into surgery. Leave your suitcase in the car. After surgery it may be brought to your room. For patients admitted to the hospital, discharge time is determined by your treatment team.   Patients discharged the day of  surgery will not be allowed to drive home.   Make arrangements for someone to be with you for the first 24 hours of your Same Day Discharge.    Please read over the following fact sheets that you were given:     _x___ Take these medicines the morning of surgery with A SIP OF WATER:    1. carvedilol (COREG) 6.25 MG tablet  2.   3.   4.  5.  6.  ____ Fleet Enema (as directed)   __x__ Use CHG Soap (or wipes) as directed  ____ Use Benzoyl Peroxide Gel as instructed  ____ Use inhalers on the day of surgery  ____ Stop metformin 2 days prior to surgery    ____ Take 1/2 of usual insulin dose the night before surgery. No insulin the morning          of surgery.   ____ Stop Coumadin/Plavix/aspirin on   ____ Stop Anti-inflammatories     ____ Stop supplements until after surgery.    ____ Bring C-Pap to the hospital.

## 2019-11-18 ENCOUNTER — Other Ambulatory Visit: Payer: Self-pay

## 2019-11-18 ENCOUNTER — Encounter: Payer: Self-pay | Admitting: Urgent Care

## 2019-11-18 ENCOUNTER — Other Ambulatory Visit
Admission: RE | Admit: 2019-11-18 | Discharge: 2019-11-18 | Disposition: A | Discharge status: Home / Self Care | Payer: Medicaid Other | Source: Ambulatory Visit | Attending: Vascular Surgery | Admitting: Vascular Surgery

## 2019-11-18 ENCOUNTER — Other Ambulatory Visit: Admission: RE | Admit: 2019-11-18 | Payer: Medicaid Other | Source: Ambulatory Visit

## 2019-11-18 DIAGNOSIS — Z01812 Encounter for preprocedural laboratory examination: Secondary | ICD-10-CM | POA: Diagnosis not present

## 2019-11-18 DIAGNOSIS — Z20822 Contact with and (suspected) exposure to covid-19: Secondary | ICD-10-CM | POA: Diagnosis not present

## 2019-11-18 LAB — SARS CORONAVIRUS 2 (TAT 6-24 HRS): SARS Coronavirus 2: NEGATIVE

## 2019-11-18 NOTE — Pre-Procedure Instructions (Signed)
Patient came via public transportation to his covid testing appointment.  When instructed that he needed to go inside preadmit testing for ekg and blood work, the Advertising account planner said that he would get him to the medical mall. Noticed that patient had not arrived inside we called him. Patient stated that he "was tired and had been around here today so he went home and would not be back again until his surgery."

## 2019-11-19 ENCOUNTER — Telehealth (INDEPENDENT_AMBULATORY_CARE_PROVIDER_SITE_OTHER): Payer: Self-pay

## 2019-11-19 NOTE — Telephone Encounter (Signed)
I attempted to contact the patient regarding his surgery with Dr. Lucky Cowboy on 11/20/19. Per pre-services the patient's insurance is out of network and to have his surgery at a [facility] facility he will need to pay upfront for his surgery. A message was left for a return call.

## 2019-11-20 ENCOUNTER — Ambulatory Visit: Payer: Medicaid Other

## 2019-11-20 ENCOUNTER — Other Ambulatory Visit: Payer: Self-pay

## 2019-11-20 ENCOUNTER — Ambulatory Visit: Payer: Medicaid Other | Admitting: Anesthesiology

## 2019-11-20 ENCOUNTER — Encounter: Payer: Self-pay | Admitting: Vascular Surgery

## 2019-11-20 ENCOUNTER — Encounter
Admission: RE | Disposition: A | Discharge status: Home / Self Care | Payer: Self-pay | Source: Home / Self Care | Attending: Vascular Surgery

## 2019-11-20 ENCOUNTER — Ambulatory Visit
Admission: RE | Admit: 2019-11-20 | Discharge: 2019-11-20 | Disposition: A | Discharge status: Home / Self Care | Payer: Medicaid Other | Attending: Vascular Surgery | Admitting: Vascular Surgery

## 2019-11-20 DIAGNOSIS — Z8673 Personal history of transient ischemic attack (TIA), and cerebral infarction without residual deficits: Secondary | ICD-10-CM | POA: Diagnosis not present

## 2019-11-20 DIAGNOSIS — N186 End stage renal disease: Secondary | ICD-10-CM | POA: Diagnosis not present

## 2019-11-20 DIAGNOSIS — Z79899 Other long term (current) drug therapy: Secondary | ICD-10-CM | POA: Diagnosis not present

## 2019-11-20 DIAGNOSIS — N185 Chronic kidney disease, stage 5: Secondary | ICD-10-CM

## 2019-11-20 DIAGNOSIS — I120 Hypertensive chronic kidney disease with stage 5 chronic kidney disease or end stage renal disease: Secondary | ICD-10-CM | POA: Diagnosis not present

## 2019-11-20 DIAGNOSIS — Y832 Surgical operation with anastomosis, bypass or graft as the cause of abnormal reaction of the patient, or of later complication, without mention of misadventure at the time of the procedure: Secondary | ICD-10-CM | POA: Diagnosis not present

## 2019-11-20 DIAGNOSIS — Z419 Encounter for procedure for purposes other than remedying health state, unspecified: Secondary | ICD-10-CM

## 2019-11-20 DIAGNOSIS — T82591A Other mechanical complication of surgically created arteriovenous shunt, initial encounter: Secondary | ICD-10-CM | POA: Diagnosis present

## 2019-11-20 DIAGNOSIS — F17210 Nicotine dependence, cigarettes, uncomplicated: Secondary | ICD-10-CM | POA: Insufficient documentation

## 2019-11-20 HISTORY — PX: AV FISTULA PLACEMENT: SHX1204

## 2019-11-20 LAB — TYPE AND SCREEN
ABO/RH(D): B POS
Antibody Screen: NEGATIVE

## 2019-11-20 LAB — BASIC METABOLIC PANEL
Anion gap: 13 (ref 5–15)
BUN: 27 mg/dL — ABNORMAL HIGH (ref 6–20)
CO2: 27 mmol/L (ref 22–32)
Calcium: 8.4 mg/dL — ABNORMAL LOW (ref 8.9–10.3)
Chloride: 99 mmol/L (ref 98–111)
Creatinine, Ser: 7.37 mg/dL — ABNORMAL HIGH (ref 0.61–1.24)
GFR calc Af Amer: 9 mL/min — ABNORMAL LOW (ref >60)
GFR calc non Af Amer: 8 mL/min — ABNORMAL LOW (ref >60)
Glucose, Bld: 73 mg/dL (ref 70–99)
Potassium: 3.1 mmol/L — ABNORMAL LOW (ref 3.5–5.1)
Sodium: 139 mmol/L (ref 135–145)

## 2019-11-20 LAB — CBC WITH DIFFERENTIAL/PLATELET
Abs Immature Granulocytes: 0.01 10*3/uL (ref 0.00–0.07)
Basophils Absolute: 0.1 10*3/uL (ref 0.0–0.1)
Basophils Relative: 2 %
Eosinophils Absolute: 0.2 10*3/uL (ref 0.0–0.5)
Eosinophils Relative: 5 %
HCT: 33.8 % — ABNORMAL LOW (ref 39.0–52.0)
Hemoglobin: 11 g/dL — ABNORMAL LOW (ref 13.0–17.0)
Immature Granulocytes: 0 %
Lymphocytes Relative: 41 %
Lymphs Abs: 1.6 10*3/uL (ref 0.7–4.0)
MCH: 30.6 pg (ref 26.0–34.0)
MCHC: 32.5 g/dL (ref 30.0–36.0)
MCV: 94.2 fL (ref 80.0–100.0)
Monocytes Absolute: 0.4 10*3/uL (ref 0.1–1.0)
Monocytes Relative: 10 %
Neutro Abs: 1.7 10*3/uL (ref 1.7–7.7)
Neutrophils Relative %: 42 %
Platelets: 183 10*3/uL (ref 150–400)
RBC: 3.59 MIL/uL — ABNORMAL LOW (ref 4.22–5.81)
RDW: 12.3 % (ref 11.5–15.5)
WBC: 4 10*3/uL (ref 4.0–10.5)
nRBC: 0 % (ref 0.0–0.2)

## 2019-11-20 LAB — URINE DRUG SCREEN, QUALITATIVE (ARMC ONLY)
Amphetamines, Ur Screen: NOT DETECTED
Barbiturates, Ur Screen: NOT DETECTED
Benzodiazepine, Ur Scrn: NOT DETECTED
Cannabinoid 50 Ng, Ur ~~LOC~~: POSITIVE — AB
Cocaine Metabolite,Ur ~~LOC~~: NOT DETECTED
MDMA (Ecstasy)Ur Screen: NOT DETECTED
Methadone Scn, Ur: NOT DETECTED
Opiate, Ur Screen: NOT DETECTED
Phencyclidine (PCP) Ur S: NOT DETECTED
Tricyclic, Ur Screen: NOT DETECTED

## 2019-11-20 LAB — PROTIME-INR
INR: 1.1 (ref 0.8–1.2)
Prothrombin Time: 14.1 seconds (ref 11.4–15.2)

## 2019-11-20 LAB — APTT: aPTT: 37 seconds — ABNORMAL HIGH (ref 24–36)

## 2019-11-20 SURGERY — INSERTION OF ARTERIOVENOUS (AV) GORE-TEX GRAFT ARM
Anesthesia: Monitor Anesthesia Care | Laterality: Right

## 2019-11-20 MED ORDER — ROPIVACAINE HCL 5 MG/ML IJ SOLN
INTRAMUSCULAR | Status: AC
  Filled 2019-11-20: qty 30

## 2019-11-20 MED ORDER — CHLORHEXIDINE GLUCONATE 0.12 % MT SOLN: 15.0000 mL | Freq: Once | OROMUCOSAL | Status: AC

## 2019-11-20 MED ORDER — FENTANYL CITRATE (PF) 100 MCG/2ML IJ SOLN: 50.0000 ug | INTRAMUSCULAR | Status: DC | PRN

## 2019-11-20 MED ORDER — CEFAZOLIN SODIUM-DEXTROSE 1-4 GM/50ML-% IV SOLN
1.0000 g | INTRAVENOUS | Status: AC
  Administered 2019-11-20: 2 g via INTRAVENOUS

## 2019-11-20 MED ORDER — ASPIRIN EC 81 MG PO TBEC: 81.0000 mg | DELAYED_RELEASE_TABLET | Freq: Every day | ORAL | 2 refills | Status: DC

## 2019-11-20 MED ORDER — CHLORHEXIDINE GLUCONATE 0.12 % MT SOLN
OROMUCOSAL | Status: AC
  Administered 2019-11-20: 15 mL via OROMUCOSAL
  Filled 2019-11-20: qty 15

## 2019-11-20 MED ORDER — FAMOTIDINE 20 MG PO TABS: 20.0000 mg | ORAL_TABLET | Freq: Once | ORAL | Status: AC

## 2019-11-20 MED ORDER — FENTANYL CITRATE (PF) 100 MCG/2ML IJ SOLN
INTRAMUSCULAR | Status: AC
  Administered 2019-11-20: 50 ug via INTRAVENOUS
  Filled 2019-11-20: qty 2

## 2019-11-20 MED ORDER — PROPOFOL 500 MG/50ML IV EMUL
INTRAVENOUS | Status: DC | PRN
  Administered 2019-11-20: 100 ug/kg/min via INTRAVENOUS

## 2019-11-20 MED ORDER — SODIUM CHLORIDE 0.9 % IV SOLN
INTRAVENOUS | Status: DC | PRN
  Administered 2019-11-20: 250 mL via INTRAMUSCULAR

## 2019-11-20 MED ORDER — PROPOFOL 10 MG/ML IV BOLUS
INTRAVENOUS | Status: AC
  Filled 2019-11-20: qty 20

## 2019-11-20 MED ORDER — HYDROCODONE-ACETAMINOPHEN 5-325 MG PO TABS: 1.0000 | ORAL_TABLET | Freq: Four times a day (QID) | ORAL | 0 refills | Status: DC | PRN

## 2019-11-20 MED ORDER — HEPARIN SODIUM (PORCINE) 1000 UNIT/ML IJ SOLN
INTRAMUSCULAR | Status: DC | PRN
Start: 2019-11-20 — End: 2019-11-20
  Administered 2019-11-20: 3000 [IU] via INTRAVENOUS

## 2019-11-20 MED ORDER — FENTANYL CITRATE (PF) 100 MCG/2ML IJ SOLN
INTRAMUSCULAR | Status: AC
  Filled 2019-11-20: qty 2

## 2019-11-20 MED ORDER — MIDAZOLAM HCL 2 MG/2ML IJ SOLN
INTRAMUSCULAR | Status: AC
  Administered 2019-11-20: 1 mg via INTRAVENOUS
  Filled 2019-11-20: qty 2

## 2019-11-20 MED ORDER — ROPIVACAINE HCL 5 MG/ML IJ SOLN
INTRAMUSCULAR | Status: DC | PRN
  Administered 2019-11-20: 20 mL via PERINEURAL

## 2019-11-20 MED ORDER — PROPOFOL 500 MG/50ML IV EMUL
INTRAVENOUS | Status: AC
  Filled 2019-11-20: qty 50

## 2019-11-20 MED ORDER — PROMETHAZINE HCL 25 MG/ML IJ SOLN: 6.2500 mg | INTRAMUSCULAR | Status: DC | PRN

## 2019-11-20 MED ORDER — CEFAZOLIN SODIUM-DEXTROSE 2-4 GM/100ML-% IV SOLN
INTRAVENOUS | Status: AC
  Filled 2019-11-20: qty 100

## 2019-11-20 MED ORDER — ACETAMINOPHEN 325 MG PO TABS: 325.0000 mg | ORAL_TABLET | ORAL | Status: DC | PRN

## 2019-11-20 MED ORDER — SODIUM CHLORIDE 0.9 % IV SOLN: INTRAVENOUS | Status: DC

## 2019-11-20 MED ORDER — BUPIVACAINE-EPINEPHRINE (PF) 0.5% -1:200000 IJ SOLN
INTRAMUSCULAR | Status: DC | PRN
  Administered 2019-11-20: 10 mL

## 2019-11-20 MED ORDER — MIDAZOLAM HCL 2 MG/2ML IJ SOLN
1.0000 mg | INTRAMUSCULAR | Status: AC | PRN
  Administered 2019-11-20: 1 mg via INTRAVENOUS

## 2019-11-20 MED ORDER — ACETAMINOPHEN 160 MG/5ML PO SOLN
325.0000 mg | ORAL | Status: DC | PRN
  Filled 2019-11-20: qty 20.3

## 2019-11-20 MED ORDER — CHLORHEXIDINE GLUCONATE CLOTH 2 % EX PADS
6.0000 | MEDICATED_PAD | Freq: Once | CUTANEOUS | Status: AC
  Administered 2019-11-20: 6 via TOPICAL

## 2019-11-20 MED ORDER — EPINEPHRINE PF 1 MG/ML IJ SOLN
INTRAMUSCULAR | Status: AC
  Filled 2019-11-20: qty 1

## 2019-11-20 MED ORDER — FAMOTIDINE 20 MG PO TABS
ORAL_TABLET | ORAL | Status: AC
  Administered 2019-11-20: 20 mg via ORAL
  Filled 2019-11-20: qty 1

## 2019-11-20 MED ORDER — DEXAMETHASONE SODIUM PHOSPHATE 10 MG/ML IJ SOLN
INTRAMUSCULAR | Status: AC
  Filled 2019-11-20: qty 1

## 2019-11-20 MED ORDER — ORAL CARE MOUTH RINSE: 15.0000 mL | Freq: Once | OROMUCOSAL | Status: AC

## 2019-11-20 MED ORDER — FENTANYL CITRATE (PF) 100 MCG/2ML IJ SOLN
INTRAMUSCULAR | Status: DC | PRN
  Administered 2019-11-20: 25 ug via INTRAVENOUS

## 2019-11-20 SURGICAL SUPPLY — 53 items
BAG DECANTER FOR FLEXI CONT (MISCELLANEOUS) ×3 IMPLANT
BLADE SURG SZ11 CARB STEEL (BLADE) ×3 IMPLANT
BOOT SUTURE AID YELLOW STND (SUTURE) ×3 IMPLANT
BRUSH SCRUB EZ  4% CHG (MISCELLANEOUS) ×2
BRUSH SCRUB EZ 4% CHG (MISCELLANEOUS) ×1 IMPLANT
CANISTER SUCT 1200ML W/VALVE (MISCELLANEOUS) ×3 IMPLANT
CHLORAPREP W/TINT 26 (MISCELLANEOUS) ×3 IMPLANT
CLIP SPRNG 6MM S-JAW DBL (CLIP) ×3
COVER WAND RF STERILE (DRAPES) ×3 IMPLANT
DECANTER SPIKE VIAL GLASS SM (MISCELLANEOUS) IMPLANT
DERMABOND ADVANCED (GAUZE/BANDAGES/DRESSINGS) ×2
DERMABOND ADVANCED .7 DNX12 (GAUZE/BANDAGES/DRESSINGS) ×1 IMPLANT
ELECT CAUTERY BLADE 6.4 (BLADE) ×3 IMPLANT
ELECT REM PT RETURN 9FT ADLT (ELECTROSURGICAL) ×3
ELECTRODE REM PT RTRN 9FT ADLT (ELECTROSURGICAL) ×1 IMPLANT
GLOVE BIO SURGEON STRL SZ7 (GLOVE) ×6 IMPLANT
GLOVE INDICATOR 7.5 STRL GRN (GLOVE) ×3 IMPLANT
GOWN STRL REUS W/ TWL LRG LVL3 (GOWN DISPOSABLE) ×1 IMPLANT
GOWN STRL REUS W/ TWL XL LVL3 (GOWN DISPOSABLE) ×2 IMPLANT
GOWN STRL REUS W/TWL LRG LVL3 (GOWN DISPOSABLE) ×2
GOWN STRL REUS W/TWL XL LVL3 (GOWN DISPOSABLE) ×4
GRAFT PROPATEN STD WALL 6X40 (Vascular Products) ×3 IMPLANT
HEMOSTAT SURGICEL 2X3 (HEMOSTASIS) ×3 IMPLANT
IV NS 500ML (IV SOLUTION) ×2
IV NS 500ML BAXH (IV SOLUTION) ×1 IMPLANT
KIT TURNOVER KIT A (KITS) ×3 IMPLANT
LABEL OR SOLS (LABEL) IMPLANT
LOOP RED MAXI  1X406MM (MISCELLANEOUS) ×2
LOOP VESSEL MAXI 1X406 RED (MISCELLANEOUS) ×1 IMPLANT
LOOP VESSEL MINI 0.8X406 BLUE (MISCELLANEOUS) ×1 IMPLANT
LOOPS BLUE MINI 0.8X406MM (MISCELLANEOUS) ×2
NEEDLE FILTER BLUNT 18X 1/2SAF (NEEDLE) ×2
NEEDLE FILTER BLUNT 18X1 1/2 (NEEDLE) ×1 IMPLANT
NS IRRIG 500ML POUR BTL (IV SOLUTION) ×3 IMPLANT
PACK EXTREMITY (MISCELLANEOUS) ×3 IMPLANT
PAD PREP 24X41 OB/GYN DISP (PERSONAL CARE ITEMS) ×3 IMPLANT
SLING ARM M TX990204 (SOFTGOODS) ×3 IMPLANT
SOLUTION CELL SAVER (CLIP) ×1 IMPLANT
STOCKINETTE STRL 4IN 9604848 (GAUZE/BANDAGES/DRESSINGS) ×3 IMPLANT
SUT GORETEX CV-6TTC-13 36IN (SUTURE) ×3 IMPLANT
SUT MNCRL AB 4-0 PS2 18 (SUTURE) ×3 IMPLANT
SUT PROLENE 6 0 BV (SUTURE) ×12 IMPLANT
SUT SILK 0 SH 30 (SUTURE) ×3 IMPLANT
SUT SILK 2 0 (SUTURE) ×2
SUT SILK 2-0 18XBRD TIE 12 (SUTURE) ×1 IMPLANT
SUT SILK 3 0 (SUTURE) ×2
SUT SILK 3-0 18XBRD TIE 12 (SUTURE) ×1 IMPLANT
SUT SILK 4 0 (SUTURE) ×2
SUT SILK 4-0 18XBRD TIE 12 (SUTURE) ×1 IMPLANT
SUT VIC AB 3-0 SH 27 (SUTURE) ×2
SUT VIC AB 3-0 SH 27X BRD (SUTURE) ×1 IMPLANT
SYR 20ML LL LF (SYRINGE) ×3 IMPLANT
SYR 3ML LL SCALE MARK (SYRINGE) ×3 IMPLANT

## 2019-11-20 NOTE — Progress Notes (Signed)
Pt tolerating food and fluids.  NAD.

## 2019-11-20 NOTE — Op Note (Signed)
[facility]  OPERATIVE NOTE   PROCEDURE:  Right upper arm brachial artery to axillary vein arteriovenous graft with 6 mm Propaten pTFE graft  PRE-OPERATIVE DIAGNOSIS: 1. end stage renal disease  2. Failed right arm AVF  POST-OPERATIVE DIAGNOSIS: same  SURGEON: Leotis Pain  ASSISTANT(S): Hezzie Bump, PA-C  ANESTHESIA: general  ESTIMATED BLOOD LOSS: 25 cc  FINDING(S): 1. none  SPECIMEN(S):  None  INDICATIONS:   Larry Morrison is a 49 y.o. male who presents with end stage renal disease and a failed right arm AVF.  Risk, benefits, and alternatives to access surgery were discussed.  The patient is aware the risks include but are not limited to: bleeding, infection, steal syndrome, nerve damage, ischemic monomelic neuropathy, failure to mature, and need for additional procedures.  The patient is aware of the risks and elects to proceed forward. An assistant was present during the procedure to help facilitate the exposure and expedite the procedure.  DESCRIPTION: After full informed written consent was obtained from the patient, the patient was brought back to the operating room and placed supine upon the operating table.  The patient was given IV antibiotics prior to proceeding.  After obtaining adequate sedation, the patient was prepped and draped in standard fashion for a right arm access procedure.  I turned my attention first to the antecubitum. The assistant provided retraction and mobilization to help facilitate exposure and expedite the procedure throughout the entire procedure.  This included following suture, using retractors, and optimizing lighting.  I made an incision over the brachial artery, and dissected down through the subcutaneous tissue to the fascia carefully and was able to dissect out the brachial artery.  The artery was about patent and of adequate size to support a graft.  It was controlled proximally and distally with vessel loops and then I  turned my attention to the high bicipital groove in the axilla.  I made an incision and dissected down through the subcutaneous tissue and fascia until I reached the axillary vein.  It was patent and adequate size for graft creation.  I then dissected this vein proximally and distal and prepared it for control with Bulldog clamps.  I took a Dietitian and dissected from the antecubital up to the axillary incision.  Then I delivered the 6 mm Propaten pTFE graft, through this metal tunneler and then pulled out the metal tunneler leaving the graft in place and making sure the line was up for orientation.  I then gave the patient 3000 units of heparin to gain some anticoagulation.  After waiting 3 minutes, I placed the brachial artery under tension proximally and distally with vessel loops, made an arteriotomy and extended it with a Potts scissor.  I sewed the graft to this arteriotomy with a running stitch of CV-6 suture.  At this point, then I completed the anastomosis in the usual fashion.  I released the vessel loops on the inflow and allowed the artery to decompress through the graft. There was good pulsatile bleeding through this graft.  I clamped the graft near its arterial anastomosis and sucked out all the blood in the graft and loaded the graft with heparinized saline.  At this point, I pulled the graft to appropriate length and reset my exposure of the axillary vein.  Then, I controlled the vein with Bulldog clamps.  An anterior wall venotomy was created with an 11 blade and extended with Potts scissors.  I then spatulated the graft to facilitate  an end-to- side anastomosis matching the arteriotomy.  In the process of spatulating, I cut the graft to appropriate length for this anastomosis.  This graft was sewn to the vein in an end-to-side configuration with a running CV-6 suture.  Prior to completing this anastomosis, I allowed the vein to back bleed and then I also allowed the artery to bleed in an  antegrade fashion.  I completed this anastomosis in the usual fashion, and then irrigated out the high bicipital exposure and then placed Surgicel and Evicel.  I then turned my attention back to the antecubitum.  There was pulsatile flow in the artery beyond the graft.   At this point, I washed out the antecubital incision. Surgicel and Evicel were then placed. There was no more active bleeding.  The subcutaneous tissue was reapproximated with a running stitch of 3-0 Vicryl.  The skin was then reapproximated with a running subcuticular 4-0 Monocryl.  The skin was then cleaned, dried, and Dermabond used to reinforce the skin closure.  We then turned our attention to the axillary incision.  The subcutaneous tissue was repaired with running stitch of 3-0 Vicryl.  The skin was then reapproximated with running subcuticular 4-0 Monocryl.  The skin was then cleaned, dried, and then the skin closure was reinforced with Dermabond.  The patient was then awakened from anesthesia and taken to the recovery room in stable condition having tolerated the procedure well.    COMPLICATIONS: None  CONDITION: Stable   Leotis Pain 11/20/2019 5:11 PM   This note was created with Dragon Medical transcription system. Any errors in dictation are purely unintentional.

## 2019-11-20 NOTE — Discharge Instructions (Signed)

## 2019-11-20 NOTE — H&P (Signed)
[facility] History & Physical Update  The patient was interviewed and re-examined.  The patient's previous History and Physical has been reviewed and is unchanged.  There is no change in the plan of care. We plan to proceed with the scheduled procedure.  Leotis Pain, MD  11/20/2019, 3:02 PM

## 2019-11-20 NOTE — Anesthesia Procedure Notes (Signed)
Anesthesia Regional Block: Supraclavicular block   Pre-Anesthetic Checklist: ,, timeout performed, Correct Patient, Correct Site, Correct Laterality, Correct Procedure, Correct Position, site marked, Risks and benefits discussed,  Surgical consent,  Pre-op evaluation,  At surgeon's request and post-op pain management  Laterality: Upper and Right  Prep: Dura Prep       Needles:  Injection technique: Single-shot  Needle Type: Echogenic Stimulator Needle     Needle Length: 10cm  Needle Gauge: 21   Needle insertion depth: 7 cm   Additional Needles:   Procedures: Doppler guided,,,, ultrasound used (permanent image in chart),,,,  Narrative:  Start time: 11/20/2019 3:02 PM End time: 11/20/2019 3:07 PM Injection made incrementally with aspirations every 5 mL.  Performed by: Personally  Anesthesiologist: Alphonsus Sias, MD  Additional Notes: Functioning IV was confirmed and O2 [place]/monitors were applied. Light sedation administered as required, patient responsive throughout. A 182mm 21ga EchoStim needle was used. Sterile prep and drape,hand hygiene and sterile gloves were used.  Negative aspiration and negative test dose prior to incremental administration of local anesthetic. 1% Lidocaine for skin wheal, 5 ml. Total LA: 55ml - 0.5% Ropivicaine/1:200 epi/8mg  decadron. U/S images stored in chart. The patient tolerated the procedure well.

## 2019-11-20 NOTE — Transfer of Care (Signed)
Immediate Anesthesia Transfer of Care Note  Patient: Larry Morrison  Procedure(s) Performed: INSERTION OF ARTERIOVENOUS (AV) GORE-TEX GRAFT ARM (Right )  Patient Location: PACU  Anesthesia Type:General  Level of Consciousness: awake, alert  and oriented  Airway & Oxygen Therapy: Patient Spontanous Breathing and Patient connected to nasal cannula oxygen  Post-op Assessment: Report given to RN and Post -op Vital signs reviewed and stable  Post vital signs: Reviewed and stable  Last Vitals:  Vitals Value Taken Time  BP 120/76 11/20/19 1717  Temp    Pulse 70 11/20/19 1719  Resp 21 11/20/19 1720  SpO2 100 % 11/20/19 1719  Vitals shown include unvalidated device data.  Last Pain:  Vitals:   11/20/19 1446  TempSrc: Tympanic  PainSc: 0-No pain         Complications: No complications documented.

## 2019-11-20 NOTE — Anesthesia Preprocedure Evaluation (Signed)
Anesthesia Evaluation  Patient identified by MRN, date of birth, ID band Patient awake    Reviewed: Allergy & Precautions, H&P , NPO status , reviewed documented beta blocker date and time   Airway Mallampati: II  TM Distance: >3 FB Neck ROM: full    Dental  (+) Poor Dentition, Chipped, Missing Very poor teeth:   Pulmonary shortness of breath, Current Smoker and Patient abstained from smoking.,    Pulmonary exam normal        Cardiovascular hypertension, Pt. 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19 No date: Dialysis patient [facility]) No date: Dyspnea No date: Foot drop No date: History of kidney stones No date: History of nephrostomy No date: Hypertension No date: Renal disorder No date: Stroke [facility])  Past Surgical History: 08/07/2019: AV FISTULA PLACEMENT; Right     Comment:  Procedure: ARTERIOVENOUS (AV) FISTULA CREATION;                Surgeon: Algernon Huxley, MD;  Location: [facility] ORS;  Service:              Vascular;  Laterality: Right; No date: NEPHRECTOMY; Left No date: NEPHRECTOMY 10/27/2016: ORCHIECTOMY; Right     Comment:  Procedure: PSB ORCHIECTOMY;  Surgeon: Hollice Espy,               MD;  Location: [facility] ORS;  Service: Urology;  Laterality:               Right; 10/27/2016: ORCHIOPEXY; Bilateral     Comment:  Procedure: ORCHIOPEXY ADULT;  Surgeon: Hollice Espy,               MD;  Location: [facility] ORS;  Service: Urology;  Laterality:               Bilateral; 10/27/2016: SCROTAL EXPLORATION; Bilateral     Comment:  Procedure: SCROTUM EXPLORATION;  Surgeon: Hollice Espy, MD;  Location: [facility]  ORS;  Service: Urology;                Laterality: Bilateral;     Reproductive/Obstetrics                             Anesthesia Physical Anesthesia Plan  ASA: IV  Anesthesia Plan: MAC   Post-op Pain Management:  Regional for Post-op pain   Induction: Intravenous  PONV Risk Score and Plan: Treatment may vary due to age or medical condition and TIVA  Airway Management Planned: Nasal Cannula and Natural Airway  Additional Equipment:   Intra-op Plan:   Post-operative Plan:   Informed Consent: I have reviewed the patients History and Physical, chart, labs and discussed the procedure including the risks, benefits and alternatives for the proposed anesthesia with the patient or authorized representative who has indicated his/her understanding and acceptance.     Dental Advisory Given  Plan Discussed with: CRNA  Anesthesia Plan Comments:         Anesthesia Quick Evaluation

## 2019-11-21 ENCOUNTER — Encounter: Payer: Self-pay | Admitting: Vascular Surgery

## 2019-11-21 NOTE — Anesthesia Postprocedure Evaluation (Signed)
Anesthesia Post Note  Patient: Health visitor  Procedure(s) Performed: INSERTION OF ARTERIOVENOUS (AV) GORE-TEX GRAFT ARM (Right )  Patient location during evaluation: PACU Anesthesia Type: MAC Level of consciousness: awake and alert Pain management: pain level controlled Vital Signs Assessment: post-procedure vital signs reviewed and stable Respiratory status: spontaneous breathing, nonlabored ventilation, respiratory function stable and patient connected to nasal cannula oxygen Cardiovascular status: blood pressure returned to baseline and stable Postop Assessment: no apparent nausea or vomiting Anesthetic complications: no   No complications documented.   Last Vitals:  Vitals:   11/20/19 1747 11/20/19 1758  BP: (!) 150/95   Pulse: 72   Resp: 15 20  Temp:    SpO2: 100% 100%    Last Pain:  Vitals:   11/20/19 1758  TempSrc:   PainSc: 0-No pain                 Martha Clan

## 2019-12-08 ENCOUNTER — Other Ambulatory Visit (INDEPENDENT_AMBULATORY_CARE_PROVIDER_SITE_OTHER): Payer: Self-pay | Admitting: Vascular Surgery

## 2019-12-08 DIAGNOSIS — N186 End stage renal disease: Secondary | ICD-10-CM

## 2019-12-08 DIAGNOSIS — Z95828 Presence of other vascular implants and grafts: Secondary | ICD-10-CM

## 2019-12-09 ENCOUNTER — Ambulatory Visit (INDEPENDENT_AMBULATORY_CARE_PROVIDER_SITE_OTHER): Payer: Medicaid Other

## 2019-12-09 ENCOUNTER — Encounter (INDEPENDENT_AMBULATORY_CARE_PROVIDER_SITE_OTHER): Payer: Self-pay | Admitting: Nurse Practitioner

## 2019-12-09 ENCOUNTER — Ambulatory Visit (INDEPENDENT_AMBULATORY_CARE_PROVIDER_SITE_OTHER): Payer: Medicaid Other | Admitting: Nurse Practitioner

## 2019-12-09 ENCOUNTER — Other Ambulatory Visit: Payer: Self-pay

## 2019-12-09 VITALS — BP 144/82 | HR 88 | Resp 16 | Ht 64.0 in | Wt 120.0 lb

## 2019-12-09 DIAGNOSIS — Z9889 Other specified postprocedural states: Secondary | ICD-10-CM

## 2019-12-09 DIAGNOSIS — Z95828 Presence of other vascular implants and grafts: Secondary | ICD-10-CM

## 2019-12-09 DIAGNOSIS — I10 Essential (primary) hypertension: Secondary | ICD-10-CM

## 2019-12-09 DIAGNOSIS — N186 End stage renal disease: Secondary | ICD-10-CM

## 2019-12-09 DIAGNOSIS — F17200 Nicotine dependence, unspecified, uncomplicated: Secondary | ICD-10-CM | POA: Diagnosis not present

## 2019-12-09 MED ORDER — HYDROCODONE-ACETAMINOPHEN 5-325 MG PO TABS: 1.0000 | ORAL_TABLET | Freq: Four times a day (QID) | ORAL | 0 refills | Status: DC | PRN

## 2019-12-12 ENCOUNTER — Encounter (INDEPENDENT_AMBULATORY_CARE_PROVIDER_SITE_OTHER): Payer: Self-pay | Admitting: Nurse Practitioner

## 2019-12-12 NOTE — Progress Notes (Signed)
Subjective:    Patient ID: Larry Morrison, male    DOB: 1970/08/24, 49 y.o.   MRN: [id number] Chief Complaint  Patient presents with  . Follow-up    ultrasound    The patient returns to the office for followup status post intervention of insertion of a right brachial axillary AV graft.The patient denies an increase in arm swelling. At the present time the patient denies hand pain.  The patient denies amaurosis fugax or recent TIA symptoms. There are no recent neurological changes noted. The patient denies claudication symptoms or rest pain symptoms. The patient denies history of DVT, PE or superficial thrombophlebitis. The patient denies recent episodes of angina or shortness of breath.    Today the patient has a flow volume of 1410.  The AV graft appears to be patent throughout with normal flow volume.  No evidence of stenosis or clot seen in the right upper arm AV graft.     Review of Systems  Skin: Positive for wound.  All other systems reviewed and are negative.      Objective:   Physical Exam Vitals reviewed.  HENT:     Head: Normocephalic.  Cardiovascular:     Rate and Rhythm: Normal rate and regular rhythm.     Pulses: Normal pulses.     Heart sounds: Normal heart sounds.  Pulmonary:     Effort: Pulmonary effort is normal.     Breath sounds: Normal breath sounds.  Skin:    General: Skin is warm and dry.  Neurological:     Mental Status: He is alert and oriented to person, place, and time. Mental status is at baseline.     Motor: Weakness present.     Gait: Gait abnormal.  Psychiatric:        Mood and Affect: Mood normal.     BP (!) 144/82 (BP Location: Left Arm)   Pulse 88   Resp 16   Ht 5\' 4"  (1.626 m)   Wt 120 lb (54.4 kg)   BMI 20.60 kg/m   Past Medical History:  Diagnosis Date  . Anemia   . Aneurysm ([place])    brain at age 60  . Dialysis patient ([place])   . Dyspnea   . Foot drop   . History of kidney stones   . History of nephrostomy   .  Hypertension   . Renal disorder   . Stroke [facility])     Social History   Socioeconomic History  . Marital status: Single    Spouse name: Not on file  . Number of children: Not on file  . Years of education: Not on file  . Highest education level: Not on file  Occupational History  . Not on file  Tobacco Use  . Smoking status: Current Some Day Smoker    Packs/day: 0.50    Types: Cigarettes  . Smokeless tobacco: Never Used  Vaping Use  . Vaping Use: Never used  Substance and Sexual Activity  . Alcohol use: No  . Drug use: Yes    Types: Marijuana    Comment: today  . Sexual activity: Not on file  Other Topics Concern  . Not on file  Social History Narrative  . Not on file   Social Determinants of Health   Financial Resource Strain:   . Difficulty of Paying Living Expenses:   Food Insecurity:   . Worried About Charity fundraiser in the Last Year:   . Arboriculturist in  the Last Year:   Transportation Needs:   . Film/video editor (Medical):   Marland Kitchen Lack of Transportation (Non-Medical):   Physical Activity:   . Days of Exercise per Week:   . Minutes of Exercise per Session:   Stress:   . Feeling of Stress :   Social Connections:   . Frequency of Communication with Friends and Family:   . Frequency of Social Gatherings with Friends and Family:   . Attends Religious Services:   . Active Member of Clubs or Organizations:   . Attends Archivist Meetings:   Marland Kitchen Marital Status:   Intimate Partner Violence:   . Fear of Current or Ex-Partner:   . Emotionally Abused:   Marland Kitchen Physically Abused:   . Sexually Abused:     Past Surgical History:  Procedure Laterality Date  . AV FISTULA PLACEMENT Right 08/07/2019   Procedure: ARTERIOVENOUS (AV) FISTULA CREATION;  Surgeon: Algernon Huxley, MD;  Location: [facility] ORS;  Service: Vascular;  Laterality: Right;  . AV FISTULA PLACEMENT Right 11/20/2019   Procedure: INSERTION OF ARTERIOVENOUS (AV) GORE-TEX GRAFT ARM;  Surgeon: Algernon Huxley, MD;  Location: [facility] ORS;  Service: Vascular;  Laterality: Right;  . NEPHRECTOMY Left   . NEPHRECTOMY    . ORCHIECTOMY Right 10/27/2016   Procedure: PSB ORCHIECTOMY;  Surgeon: Hollice Espy, MD;  Location: [facility] ORS;  Service: Urology;  Laterality: Right;  . ORCHIOPEXY Bilateral 10/27/2016   Procedure: ORCHIOPEXY ADULT;  Surgeon: Hollice Espy, MD;  Location: [facility] ORS;  Service: Urology;  Laterality: Bilateral;  . SCROTAL EXPLORATION Bilateral 10/27/2016   Procedure: SCROTUM EXPLORATION;  Surgeon: Hollice Espy, MD;  Location: [facility] ORS;  Service: Urology;  Laterality: Bilateral;    Family History  Family history unknown: Yes    Allergies  Allergen Reactions  . Vancomycin     Patient denies       Assessment & Plan:   1. ESRD (end stage renal disease) ([place]) Recommend:  The patient is doing well and currently has adequate dialysis access. The patient's dialysis access is adequate for use.  The patient can begin accessing his fistula on 12/22/2019  The patient should have a duplex ultrasound of the dialysis access in 6 months. The patient will follow-up with me in the office after each ultrasound     2. Tobacco use disorder Smoking cessation was discussed, 3-10 minutes spent on this topic specifically   3. Essential hypertension Continue antihypertensive medications as already ordered, these medications have been reviewed and there are no changes at this time.    Current Outpatient Medications on File Prior to Visit  Medication Sig Dispense Refill  . aspirin EC 81 MG tablet Take 1 tablet (81 mg total) by mouth daily. 150 tablet 2  . carvedilol (COREG) 6.25 MG tablet Take 6.25 mg by mouth 2 (two) times daily.    Marland Kitchen docusate sodium (COLACE) 100 MG capsule Take 1 capsule (100 mg total) by mouth 2 (two) times daily. 60 capsule 0  . sevelamer carbonate (RENVELA) 0.8 g PACK packet Take by mouth.     . sodium chloride flush 0.9 % SOLN injection Inject into the vein.      No current facility-administered medications on file prior to visit.    There are no Patient Instructions on file for this visit. No follow-ups on file.   Kris Hartmann, NP

## 2020-01-14 ENCOUNTER — Telehealth (INDEPENDENT_AMBULATORY_CARE_PROVIDER_SITE_OTHER): Payer: Self-pay

## 2020-01-14 NOTE — Telephone Encounter (Signed)
A fax was received from [place] at [facility] wanting a return call to the clinic to schedule the patient for a permcath removal so that transportation can be arranged for the patient. I called to Larry Morrison and the patient has been scheduled with Dr. Lucky Cowboy for a permcath removal on 01/22/20 with a 10:45 am arrival time to the MM and covid testing on 01/20/20 between 8-1 pm at the [place]. I did not speak with the patient due to the dialysis center making sure the patient has transportation.

## 2020-01-20 ENCOUNTER — Other Ambulatory Visit
Admission: RE | Admit: 2020-01-20 | Discharge: 2020-01-20 | Disposition: A | Discharge status: Home / Self Care | Payer: Medicaid Other | Source: Ambulatory Visit | Attending: Vascular Surgery | Admitting: Vascular Surgery

## 2020-01-20 ENCOUNTER — Other Ambulatory Visit: Payer: Self-pay

## 2020-01-20 DIAGNOSIS — Z01812 Encounter for preprocedural laboratory examination: Secondary | ICD-10-CM | POA: Diagnosis not present

## 2020-01-20 DIAGNOSIS — Z20822 Contact with and (suspected) exposure to covid-19: Secondary | ICD-10-CM | POA: Diagnosis not present

## 2020-01-20 LAB — SARS CORONAVIRUS 2 (TAT 6-24 HRS): SARS Coronavirus 2: NEGATIVE

## 2020-01-21 ENCOUNTER — Other Ambulatory Visit (INDEPENDENT_AMBULATORY_CARE_PROVIDER_SITE_OTHER): Payer: Self-pay | Admitting: Nurse Practitioner

## 2020-01-22 ENCOUNTER — Ambulatory Visit: Admission: RE | Admit: 2020-01-22 | Payer: Medicaid Other | Source: Home / Self Care | Admitting: Vascular Surgery

## 2020-01-22 SURGERY — DIALYSIS/PERMA CATHETER REMOVAL
Anesthesia: LOCAL

## 2020-01-28 ENCOUNTER — Other Ambulatory Visit (INDEPENDENT_AMBULATORY_CARE_PROVIDER_SITE_OTHER): Payer: Self-pay | Admitting: Nurse Practitioner

## 2020-01-28 ENCOUNTER — Ambulatory Visit: Admission: RE | Admit: 2020-01-28 | Payer: Medicaid Other | Source: Home / Self Care | Admitting: Vascular Surgery

## 2020-01-28 ENCOUNTER — Encounter: Admission: RE | Payer: Self-pay | Source: Home / Self Care

## 2020-01-28 SURGERY — DIALYSIS/PERMA CATHETER REMOVAL
Anesthesia: LOCAL

## 2020-02-02 ENCOUNTER — Telehealth (INDEPENDENT_AMBULATORY_CARE_PROVIDER_SITE_OTHER): Payer: Self-pay

## 2020-02-02 NOTE — Telephone Encounter (Signed)
A message was left on Friday from [place] at [facility] for the patient to have a permcath removal on a Thursday and covid testing on a Tuesday. I contacted Larry Morrison today and spoke with [facility] and the patient is scheduled with Dr. Lucky Morrison for a permcath removal on 02/12/20 with a 10:45 am arrival time to the [facility]. Covid testing on 02/10/20 between 8-1 pm at the [place]. Pre-procedure instructions will be faxed to [facility] at [facility] as requested.

## 2020-02-10 ENCOUNTER — Other Ambulatory Visit: Admission: RE | Admit: 2020-02-10 | Payer: Medicaid Other | Source: Ambulatory Visit

## 2020-02-12 ENCOUNTER — Other Ambulatory Visit (INDEPENDENT_AMBULATORY_CARE_PROVIDER_SITE_OTHER): Payer: Self-pay | Admitting: Nurse Practitioner

## 2020-02-17 ENCOUNTER — Other Ambulatory Visit
Admission: RE | Admit: 2020-02-17 | Discharge: 2020-02-17 | Disposition: A | Discharge status: Home / Self Care | Payer: Medicaid Other | Source: Ambulatory Visit | Attending: Vascular Surgery | Admitting: Vascular Surgery

## 2020-02-17 ENCOUNTER — Other Ambulatory Visit: Payer: Self-pay

## 2020-02-17 DIAGNOSIS — Z01812 Encounter for preprocedural laboratory examination: Secondary | ICD-10-CM | POA: Diagnosis not present

## 2020-02-17 DIAGNOSIS — Z20822 Contact with and (suspected) exposure to covid-19: Secondary | ICD-10-CM | POA: Insufficient documentation

## 2020-02-17 LAB — SARS CORONAVIRUS 2 (TAT 6-24 HRS): SARS Coronavirus 2: NEGATIVE

## 2020-02-19 ENCOUNTER — Ambulatory Visit: Admission: RE | Admit: 2020-02-19 | Payer: Medicaid Other | Source: Home / Self Care | Admitting: Vascular Surgery

## 2020-02-19 ENCOUNTER — Encounter: Admission: RE | Payer: Self-pay | Source: Home / Self Care

## 2020-02-19 SURGERY — DIALYSIS/PERMA CATHETER REMOVAL
Anesthesia: LOCAL

## 2020-03-28 ENCOUNTER — Emergency Department
Admission: EM | Admit: 2020-03-28 | Discharge: 2020-03-28 | Disposition: A | Discharge status: Home / Self Care | Payer: Medicaid Other | Attending: Emergency Medicine | Admitting: Emergency Medicine

## 2020-03-28 ENCOUNTER — Emergency Department: Payer: Medicaid Other

## 2020-03-28 DIAGNOSIS — Z79899 Other long term (current) drug therapy: Secondary | ICD-10-CM | POA: Insufficient documentation

## 2020-03-28 DIAGNOSIS — Z9115 Patient's noncompliance with renal dialysis: Secondary | ICD-10-CM | POA: Diagnosis present

## 2020-03-28 DIAGNOSIS — D593 Hemolytic-uremic syndrome: Secondary | ICD-10-CM | POA: Diagnosis not present

## 2020-03-28 DIAGNOSIS — Z992 Dependence on renal dialysis: Secondary | ICD-10-CM | POA: Diagnosis not present

## 2020-03-28 DIAGNOSIS — I120 Hypertensive chronic kidney disease with stage 5 chronic kidney disease or end stage renal disease: Secondary | ICD-10-CM | POA: Diagnosis not present

## 2020-03-28 DIAGNOSIS — N186 End stage renal disease: Secondary | ICD-10-CM | POA: Diagnosis not present

## 2020-03-28 DIAGNOSIS — F17210 Nicotine dependence, cigarettes, uncomplicated: Secondary | ICD-10-CM | POA: Insufficient documentation

## 2020-03-28 DIAGNOSIS — N19 Unspecified kidney failure: Secondary | ICD-10-CM

## 2020-03-28 DIAGNOSIS — Z7982 Long term (current) use of aspirin: Secondary | ICD-10-CM | POA: Diagnosis not present

## 2020-03-28 LAB — BASIC METABOLIC PANEL
Anion gap: 15 (ref 5–15)
BUN: 113 mg/dL — ABNORMAL HIGH (ref 6–20)
CO2: 17 mmol/L — ABNORMAL LOW (ref 22–32)
Calcium: 8.1 mg/dL — ABNORMAL LOW (ref 8.9–10.3)
Chloride: 111 mmol/L (ref 98–111)
Creatinine, Ser: 11.81 mg/dL — ABNORMAL HIGH (ref 0.61–1.24)
GFR, Estimated: 5 mL/min — ABNORMAL LOW (ref >60)
Glucose, Bld: 92 mg/dL (ref 70–99)
Potassium: 5.1 mmol/L (ref 3.5–5.1)
Sodium: 143 mmol/L (ref 135–145)

## 2020-03-28 LAB — CBC
HCT: 35.1 % — ABNORMAL LOW (ref 39.0–52.0)
Hemoglobin: 10.8 g/dL — ABNORMAL LOW (ref 13.0–17.0)
MCH: 30.9 pg (ref 26.0–34.0)
MCHC: 30.8 g/dL (ref 30.0–36.0)
MCV: 100.6 fL — ABNORMAL HIGH (ref 80.0–100.0)
Platelets: 170 10*3/uL (ref 150–400)
RBC: 3.49 MIL/uL — ABNORMAL LOW (ref 4.22–5.81)
RDW: 17.3 % — ABNORMAL HIGH (ref 11.5–15.5)
WBC: 5.7 10*3/uL (ref 4.0–10.5)
nRBC: 0 % (ref 0.0–0.2)

## 2020-03-28 NOTE — ED Notes (Signed)
Lab was able to obtain blood samples - Lab tech is not sure that the tubes have not hemolyzed but will attempt to use

## 2020-03-28 NOTE — ED Triage Notes (Signed)
Patient sent from dialysis for a test. They are not sure what is required but he has missed three sessions. Patient stated he missed because he just didn't feel like going.

## 2020-03-28 NOTE — ED Notes (Signed)
Pt is dialysis pt and needs IV access and labs Lab is unable to obtain blood for labs and nursing staff unable to obtain IV access

## 2020-03-28 NOTE — Discharge Instructions (Addendum)
You need to have dialysis tomorrow per your kidney doctor!

## 2020-03-28 NOTE — ED Notes (Signed)
Pt reports that he is here because he has not been to dialysis x3 days (skipped this past week) - Pt states that he just did not feel like going Pt reports he is here for labs and dialysis

## 2020-03-28 NOTE — ED Provider Notes (Signed)
[facility] Emergency Department Provider Note   ____________________________________________    I have reviewed the triage vital signs and the nursing notes.   HISTORY  Chief Complaint Missed dialysis    HPI Larry Morrison is a 49 y.o. male sent from dialysis for evaluation because he has missed three dialysis sessions. He reports he just "did not feel like going ", has no real physical complaints at this time. No shortness of breath. No dizziness. No nausea or vomiting. No fatigue fevers chills.  Past Medical History:  Diagnosis Date  . Anemia   . 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 Dialysis patient ([place])   . Dyspnea   . Foot drop   . History of kidney stones   . History of nephrostomy   . Hypertension   . Renal disorder   . Stroke [facility])     Patient Active Problem List   Diagnosis Date Noted  . Anemia 09/17/2019  . Essential hypertension 07/15/2019  . Testicular torsion   . Pyuria 06/10/2016  . Chronic pain following surgery or procedure 12/01/2015  . History of nephrostomy 12/01/2015  . Tobacco use disorder 12/01/2015  . ESRD (end stage renal disease) ([place]) 04/28/2014  . Obstructed nephrostomy tube ([place]) 10/23/2013  . 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 Neurogenic bladder 02/09/1998  . Urinary calculus 02/09/1998  . 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 AV FISTULA PLACEMENT Right 08/07/2019   Procedure: ARTERIOVENOUS (AV) FISTULA CREATION;  Surgeon: Algernon Huxley, MD;  Location: [facility] ORS;  Service: Vascular;  Laterality: Right;  . AV FISTULA PLACEMENT Right 11/20/2019   Procedure: INSERTION OF ARTERIOVENOUS (AV) GORE-TEX GRAFT ARM;  Surgeon: Algernon Huxley, MD;  Location: [facility] ORS;  Service: Vascular;  Laterality: Right;  . NEPHRECTOMY Left   . NEPHRECTOMY    . ORCHIECTOMY Right 10/27/2016   Procedure: PSB ORCHIECTOMY;   Surgeon: Hollice Espy, MD;  Location: [facility] ORS;  Service: Urology;  Laterality: Right;  . ORCHIOPEXY Bilateral 10/27/2016   Procedure: ORCHIOPEXY ADULT;  Surgeon: Hollice Espy, MD;  Location: [facility] ORS;  Service: Urology;  Laterality: Bilateral;  . SCROTAL EXPLORATION Bilateral 10/27/2016   Procedure: SCROTUM EXPLORATION;  Surgeon: Hollice Espy, MD;  Location: [facility] ORS;  Service: Urology;  Laterality: Bilateral;    Prior to Admission medications   Medication Sig Start Date End Date Taking? Authorizing Provider  aspirin EC 81 MG tablet Take 1 tablet (81 mg total) by mouth daily. 11/20/19   Algernon Huxley, MD  carvedilol (COREG) 6.25 MG tablet Take 6.25 mg by mouth 2 (two) times daily. 10/28/19   [provider]  docusate sodium (COLACE) 100 MG capsule Take 1 capsule (100 mg total) by mouth 2 (two) times daily. 10/27/16   Hollice Espy, MD  HYDROcodone-acetaminophen (NORCO) 5-325 MG tablet Take 1 tablet by mouth every 6 (six) hours as needed for moderate pain. 12/09/19   Kris Hartmann, NP  sevelamer carbonate (RENVELA) 0.8 g PACK packet Take by mouth.  10/31/19 10/30/20  [provider]     Allergies Vancomycin  Family History  Family history unknown: Yes    Social History Social History   Tobacco Use  . Smoking status: Current Some Day Smoker    Packs/day: 0.50    Types: Cigarettes  . Smokeless tobacco: Never Used  Vaping Use  . Vaping Use: Never used  Substance Use Topics  . Alcohol use: No  . Drug use: Yes    Types: Marijuana    Comment: today    Review of Systems  Constitutional: No fever/chills Eyes: No visual changes.  ENT: No sore throat. Cardiovascular: Denies chest pain. Respiratory: Denies shortness of breath. Gastrointestinal: No abdominal pain.   Genitourinary: Negative for dysuria. Musculoskeletal: Negative for back pain. Skin: Negative for rash. Neurological: Negative for headaches or  weakness   ____________________________________________   PHYSICAL EXAM:  VITAL SIGNS: ED Triage Vitals  Enc Vitals Group     BP 03/28/20 1315 (!) 159/107     Pulse Rate 03/28/20 1315 94     Resp 03/28/20 1315 20     Temp 03/28/20 1315 98.1 F (36.7 C)     Temp Source 03/28/20 1315 Oral     SpO2 03/28/20 1315 100 %     Weight 03/28/20 1323 59 kg (130 lb)     Height 03/28/20 1323 1.626 m (5\' 4" )     Head Circumference --      Peak Flow --      Pain Score 03/28/20 1316 0     Pain Loc --      Pain Edu? --      Excl. in [place]? --     Constitutional: Alert and oriented.   Nose: No congestion/rhinnorhea. Mouth/Throat: Mucous membranes are moist.   Neck:  Painless ROM Cardiovascular: Normal rate, regular rhythm. Grossly normal heart sounds.  Good peripheral circulation. Respiratory: Normal respiratory effort.  No retractions. Lungs CTAB. Gastrointestinal: Soft and nontender. No distention.  No CVA tenderness.  Musculoskeletal: No lower extremity tenderness nor edema.  Warm and well perfused Neurologic:  Normal speech and language. No gross focal neurologic deficits are appreciated.  Skin:  Skin is warm, dry and intact. No rash noted. Psychiatric: Mood and affect are normal. Speech and behavior are normal.  ____________________________________________   LABS (all labs ordered are listed, but only abnormal results are displayed)  Labs Reviewed  CBC - Abnormal; Notable for the following components:      Result Value   RBC 3.49 (*)    Hemoglobin 10.8 (*)    HCT 35.1 (*)    MCV 100.6 (*)    RDW 17.3 (*)    All other components within normal limits  BASIC METABOLIC PANEL - Abnormal; Notable for the following components:   CO2 17 (*)    BUN 113 (*)    Creatinine, Ser 11.81 (*)    Calcium 8.1 (*)    GFR, Estimated 5 (*)    All other components within normal limits   ____________________________________________  EKG  ED ECG REPORT I, Lavonia Drafts, the attending  physician, personally viewed and interpreted this ECG.  Date: 03/28/2020  Rhythm: normal sinus rhythm QRS Axis: normal Intervals: normal ST/T Wave abnormalities: Nonspecific changes Narrative Interpretation: no evidence of acute ischemia  ____________________________________________  RADIOLOGY  Chest x-ray viewed by me, cardiomegaly noted ____________________________________________   PROCEDURES  Procedure(s) performed: No  Procedures   Critical Care performed: No ____________________________________________   INITIAL IMPRESSION / ASSESSMENT AND PLAN / ED COURSE  Pertinent labs & imaging results that were available during my care of the patient were reviewed by me and considered in my medical decision making (see chart for details).  Patient presents after missing Monday Wednesday and Friday dialysis sessions. Overall he is well-appearing and in no acute distress. He is hypertensive but that is not unusual for him. Vital signs otherwise reassuring.  No respiratory distress. Denies palpitations. Will obtain EKG, screening labs to determine whether he needs urgent dialysis.`  Lab work is reassuring, discussed with Dr. Candiss Norse of nephrology who agrees with discharge and dialysis tomorrow  Reevaluated patient, respiratory rate is 18, no increased work of breathing, possible small amount of edema on chest x-ray but given no increased work of breathing appropriate for dialysis tomorrow.  Return precautions discussed, patient is comfortable with this plan.   ____________________________________________   FINAL CLINICAL IMPRESSION(S) / ED DIAGNOSES  Final diagnoses:  Uremic syndrome        Note:  This document was prepared using Dragon voice recognition software and may include unintentional dictation errors.   Lavonia Drafts, MD 03/28/20 [phone]

## 2020-03-28 NOTE — ED Notes (Signed)
Called lab to come collect blood sample. Unable to obtain per triage RN

## 2020-05-22 DIAGNOSIS — Z8616 Personal history of COVID-19: Secondary | ICD-10-CM

## 2020-05-22 HISTORY — DX: Personal history of COVID-19: Z86.16

## 2020-06-03 ENCOUNTER — Other Ambulatory Visit (INDEPENDENT_AMBULATORY_CARE_PROVIDER_SITE_OTHER): Payer: Self-pay | Admitting: Nurse Practitioner

## 2020-06-03 DIAGNOSIS — N186 End stage renal disease: Secondary | ICD-10-CM

## 2020-06-08 ENCOUNTER — Ambulatory Visit (INDEPENDENT_AMBULATORY_CARE_PROVIDER_SITE_OTHER): Payer: Medicaid Other | Admitting: Vascular Surgery

## 2020-06-08 ENCOUNTER — Encounter (INDEPENDENT_AMBULATORY_CARE_PROVIDER_SITE_OTHER): Payer: Medicaid Other

## 2020-06-10 ENCOUNTER — Other Ambulatory Visit: Payer: Self-pay

## 2020-06-10 ENCOUNTER — Emergency Department
Admission: EM | Admit: 2020-06-10 | Discharge: 2020-06-10 | Disposition: A | Discharge status: Home / Self Care | Payer: Medicaid Other | Attending: Emergency Medicine | Admitting: Emergency Medicine

## 2020-06-10 DIAGNOSIS — Z9115 Patient's noncompliance with renal dialysis: Secondary | ICD-10-CM

## 2020-06-10 DIAGNOSIS — Z79899 Other long term (current) drug therapy: Secondary | ICD-10-CM | POA: Diagnosis not present

## 2020-06-10 DIAGNOSIS — N186 End stage renal disease: Secondary | ICD-10-CM | POA: Insufficient documentation

## 2020-06-10 DIAGNOSIS — M791 Myalgia, unspecified site: Secondary | ICD-10-CM | POA: Diagnosis present

## 2020-06-10 DIAGNOSIS — I120 Hypertensive chronic kidney disease with stage 5 chronic kidney disease or end stage renal disease: Secondary | ICD-10-CM | POA: Insufficient documentation

## 2020-06-10 DIAGNOSIS — Z8673 Personal history of transient ischemic attack (TIA), and cerebral infarction without residual deficits: Secondary | ICD-10-CM | POA: Insufficient documentation

## 2020-06-10 DIAGNOSIS — Z7982 Long term (current) use of aspirin: Secondary | ICD-10-CM | POA: Diagnosis not present

## 2020-06-10 DIAGNOSIS — F17210 Nicotine dependence, cigarettes, uncomplicated: Secondary | ICD-10-CM | POA: Diagnosis not present

## 2020-06-10 DIAGNOSIS — E875 Hyperkalemia: Secondary | ICD-10-CM | POA: Insufficient documentation

## 2020-06-10 LAB — CBC
HCT: 29.7 % — ABNORMAL LOW (ref 39.0–52.0)
Hemoglobin: 9.5 g/dL — ABNORMAL LOW (ref 13.0–17.0)
MCH: 31.3 pg (ref 26.0–34.0)
MCHC: 32 g/dL (ref 30.0–36.0)
MCV: 97.7 fL (ref 80.0–100.0)
Platelets: 161 10*3/uL (ref 150–400)
RBC: 3.04 MIL/uL — ABNORMAL LOW (ref 4.22–5.81)
RDW: 15.9 % — ABNORMAL HIGH (ref 11.5–15.5)
WBC: 6.6 10*3/uL (ref 4.0–10.5)
nRBC: 0.8 % — ABNORMAL HIGH (ref 0.0–0.2)

## 2020-06-10 LAB — BASIC METABOLIC PANEL
Anion gap: 19 — ABNORMAL HIGH (ref 5–15)
BUN: 103 mg/dL — ABNORMAL HIGH (ref 6–20)
CO2: 16 mmol/L — ABNORMAL LOW (ref 22–32)
Calcium: 8.9 mg/dL (ref 8.9–10.3)
Chloride: 110 mmol/L (ref 98–111)
Creatinine, Ser: 12.96 mg/dL — ABNORMAL HIGH (ref 0.61–1.24)
GFR, Estimated: 4 mL/min — ABNORMAL LOW (ref >60)
Glucose, Bld: 96 mg/dL (ref 70–99)
Potassium: 5.9 mmol/L — ABNORMAL HIGH (ref 3.5–5.1)
Sodium: 145 mmol/L (ref 135–145)

## 2020-06-10 MED ORDER — CALCIUM GLUCONATE-NACL 1-0.675 GM/50ML-% IV SOLN: 1.0000 g | Freq: Once | INTRAVENOUS | Status: DC

## 2020-06-10 MED ORDER — SODIUM ZIRCONIUM CYCLOSILICATE 10 G PO PACK
10.0000 g | PACK | Freq: Once | ORAL | Status: AC
  Administered 2020-06-10: 10 g via ORAL
  Filled 2020-06-10: qty 1

## 2020-06-10 NOTE — ED Notes (Signed)
Hardcopy of d/c instructions printed and signed (unable to obtain electronic signature)  -- [employer] at bedside to provide courtesy ride home -- this nurse has verbally reviewed d/c instructions and provided pt with written copy - pt ackowledges verbal understanding -- denies any additional questions concerns needs-- escorted to police vehicle via w/c

## 2020-06-10 NOTE — ED Provider Notes (Signed)
[facility] Emergency Department Provider Note  ____________________________________________   Event Date/Time   First MD Initiated Contact with Patient 06/10/20 1857     (approximate)  I have reviewed the triage vital signs and the nursing notes.   HISTORY  Chief Complaint Medical Clearance   HPI Larry Morrison is a 50 y.o. male with a past medical history of kidney stones, HTN, CVA, ESRD scheduled to be on HD Tuesday Thursday Saturday who presents via EMS after well check was made due to patient missing dialysis for the past week.  Patient states he missed dialysis twice because he overslept.  He states he has nephrostomy tube on his right side and he is little sore but otherwise has not had any other acute sick symptoms including headache, earache, sore throat, chest pain, cough, shortness of breath, diarrhea, extremity pain, rash or recent traumatic injuries or falls.  Denies EtOH use illicit drug use.  States his nephrostomy tube has been functioning appropriately and is putting out urine and he has not had any bleeding or drainage from insertion site.  He states the insertion site is loose but is scheduled to be taken out tomorrow.  He denies any other acute concerns at this time         Past Medical History:  Diagnosis Date  . Anemia   . Aneurysm ([place])    brain at age 50  . Dialysis patient ([place])   . Dyspnea   . Foot drop   . History of kidney stones   . History of nephrostomy   . Hypertension   . Renal disorder   . Stroke [facility])     Patient Active Problem List   Diagnosis Date Noted  . Anemia 09/17/2019  . Essential hypertension 07/15/2019  . Testicular torsion   . Pyuria 06/10/2016  . Chronic pain following surgery or procedure 12/01/2015  . History of nephrostomy 12/01/2015  . Tobacco use disorder 12/01/2015  . ESRD (end stage renal disease) ([place]) 04/28/2014  . Obstructed nephrostomy tube ([place]) 10/23/2013  . Congenital obstructive  defect of renal pelvis and ureter 04/22/2012  . Neurogenic bladder 02/09/1998  . Urinary calculus 02/09/1998  . 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 AV FISTULA PLACEMENT Right 08/07/2019   Procedure: ARTERIOVENOUS (AV) FISTULA CREATION;  Surgeon: Algernon Huxley, MD;  Location: [facility] ORS;  Service: Vascular;  Laterality: Right;  . AV FISTULA PLACEMENT Right 11/20/2019   Procedure: INSERTION OF ARTERIOVENOUS (AV) GORE-TEX GRAFT ARM;  Surgeon: Algernon Huxley, MD;  Location: [facility] ORS;  Service: Vascular;  Laterality: Right;  . NEPHRECTOMY Left   . NEPHRECTOMY    . ORCHIECTOMY Right 10/27/2016   Procedure: PSB ORCHIECTOMY;  Surgeon: Hollice Espy, MD;  Location: [facility] ORS;  Service: Urology;  Laterality: Right;  . ORCHIOPEXY Bilateral 10/27/2016   Procedure: ORCHIOPEXY ADULT;  Surgeon: Hollice Espy, MD;  Location: [facility] ORS;  Service: Urology;  Laterality: Bilateral;  . SCROTAL EXPLORATION Bilateral 10/27/2016   Procedure: SCROTUM EXPLORATION;  Surgeon: Hollice Espy, MD;  Location: [facility] ORS;  Service: Urology;  Laterality: Bilateral;    Prior to Admission medications   Medication Sig Start Date End Date Taking? Authorizing Provider  aspirin EC 81 MG tablet Take 1 tablet (81 mg total) by mouth daily. 11/20/19   Algernon Huxley, MD  carvedilol (COREG) 6.25 MG tablet Take 6.25 mg by mouth 2 (two) times daily. 10/28/19   [provider]  docusate sodium (COLACE) 100 MG capsule Take 1 capsule (100 mg total) by mouth 2 (two) times daily. 10/27/16   Hollice Espy, MD  HYDROcodone-acetaminophen (NORCO) 5-325 MG tablet Take 1 tablet by mouth every 6 (six) hours as needed for moderate pain. 12/09/19   Kris Hartmann, NP  sevelamer carbonate (RENVELA) 0.8 g PACK packet Take by mouth.  10/31/19 10/30/20  [provider]    Allergies Vancomycin  Family History  Family history unknown: Yes    Social History Social History    Tobacco Use  . Smoking status: Current Some Day Smoker    Packs/day: 0.50    Types: Cigarettes  . Smokeless tobacco: Never Used  Vaping Use  . Vaping Use: Never used  Substance Use Topics  . Alcohol use: No  . Drug use: Yes    Types: Marijuana    Comment: today    Review of Systems  Review of Systems  Constitutional: Positive for malaise/fatigue. Negative for chills and fever.  HENT: Negative for sore throat.   Eyes: Negative for pain.  Respiratory: Negative for cough and stridor.   Cardiovascular: Negative for chest pain.  Gastrointestinal: Negative for vomiting.  Genitourinary: Negative for dysuria.  Musculoskeletal: Negative for falls and joint pain.  Skin: Negative for rash.  Neurological: Negative for seizures, loss of consciousness and headaches.  Psychiatric/Behavioral: Negative for suicidal ideas.  All other systems reviewed and are negative.     ____________________________________________   PHYSICAL EXAM:  VITAL SIGNS: ED Triage Vitals  Enc Vitals Group     BP 06/10/20 1118 (!) 152/90     Pulse Rate 06/10/20 1118 96     Resp 06/10/20 1118 18     Temp 06/10/20 1118 97.8 F (36.6 C)     Temp Source 06/10/20 1118 Oral     SpO2 06/10/20 1118 99 %     Weight 06/10/20 1119 128 lb (58.1 kg)     Height 06/10/20 1119 '5\' 4"'$  (1.626 m)     Head Circumference --      Peak Flow --      Pain Score 06/10/20 1119 4     Pain Loc --      Pain Edu? --      Excl. in [place]? --    Vitals:   06/10/20 1726 06/10/20 2057  BP: (!) 156/87 (!) 153/89  Pulse: 89 87  Resp: 18 16  Temp: (!) 97.5 F (36.4 C)   SpO2: 100% 99%   Physical Exam Vitals and nursing note reviewed.  Constitutional:      Appearance: He is well-developed and well-nourished.  HENT:     Head: Normocephalic and atraumatic.     Right Ear: External ear normal.     Left Ear: External ear normal.     Nose: Nose normal.  Eyes:     Conjunctiva/sclera: Conjunctivae normal.  Cardiovascular:     Rate  and Rhythm: Normal rate and regular rhythm.     Heart sounds: No murmur heard.   Pulmonary:     Effort: Pulmonary effort is normal. No respiratory distress.     Breath sounds: Normal breath sounds.  Abdominal:     Palpations: Abdomen is soft.     Tenderness: There is no abdominal tenderness.  Musculoskeletal:        General: No edema.     Cervical back: Neck supple.  Skin:    General: Skin is warm and dry.  Neurological:     Mental Status:  He is alert.  Psychiatric:        Mood and Affect: Mood and affect normal.     Nephrostomy tube is present on the patient's right flank.  No surrounding erythema induration streaking or overlying skin changes.  No bleeding or drainage from insertion site.  Sutures appear out but nephrostomy tube appears in place. ____________________________________________   LABS (all labs ordered are listed, but only abnormal results are displayed)  Labs Reviewed  BASIC METABOLIC PANEL - Abnormal; Notable for the following components:      Result Value   Potassium 5.9 (*)    CO2 16 (*)    BUN 103 (*)    Creatinine, Ser 12.96 (*)    GFR, Estimated 4 (*)    Anion gap 19 (*)    All other components within normal limits  CBC - Abnormal; Notable for the following components:   RBC 3.04 (*)    Hemoglobin 9.5 (*)    HCT 29.7 (*)    RDW 15.9 (*)    nRBC 0.8 (*)    All other components within normal limits   ____________________________________________  EKG  Sinus rhythm with a ventricular rate of 90, normal axis, prolonged QTc interval at 521 and no clear evidence of acute ischemia or other significant arrhythmia. ____________________________________________   ____________________________________________   PROCEDURES  Procedure(s) performed (including Critical Care):  .1-3 Lead EKG Interpretation Performed by: Lucrezia Starch, MD Authorized by: Lucrezia Starch, MD     Interpretation: normal     ECG rate assessment: normal     Rhythm:  sinus rhythm     Ectopy: none     Conduction: normal       ____________________________________________   INITIAL IMPRESSION / ASSESSMENT AND PLAN / ED COURSE      Patient presents with [place] to history exam after wellness check was made after patient missed approximately week of dialysis.  Patient thinks he last went to dialysis on 1/13.  On arrival he is slightly hypertensive with BP of 136/87 with otherwise stable vital signs on room air.  He denies any complaints other than some fatigue.  BMP remarkable for K of 5.9, bicarb of 16, creatinine of 12.96 and anion gap of 19.  Suspect patient's acidosis related to his BUN of 103 as he has no other acute infectious symptoms have low suspicion for sepsis severe dehydration or other significant electrolyte or metabolic derangement.  No history of recent traumatic injuries or falls.  Nephrostomy site does not appear infected.  Patient denies any other acute symptoms at this time.  CBC shows no leukocytosis and hemoglobin of 9.5 at baseline given it was 10.82 months ago.  I discussed patient's presentation and work-up with on-call nephrologist Dr. Holley Raring and reviewed patient's acidosis and hyperkalemia.  He stated he recommend giving below noted medications and having patient go to his dialysis center tomorrow morning.  If patient is unable to go to dialysis tomorrow morning he must return immediately to the emergency room for dialysis.  Patient stated he was comfortable with this plan and would return immediately if he was unable to go to dialysis.  Patient discharged stable condition.  Strict return precautions advised and discussed.  ____________________________________________   FINAL CLINICAL IMPRESSION(S) / ED DIAGNOSES  Final diagnoses:  ESRD (end stage renal disease) ([place])  Hyperkalemia  Noncompliance of patient with renal dialysis ([place])    Medications  sodium zirconium cyclosilicate (LOKELMA) packet 10 g (10 g Oral Given 06/10/20 2059)  ED Discharge Orders    None       Note:  This document was prepared using Dragon voice recognition software and may include unintentional dictation errors.   Lucrezia Starch, MD 06/10/20 2242

## 2020-06-10 NOTE — ED Triage Notes (Addendum)
Pt comes into the ED via EMS from home, EMS reports [facility] clinic called 911 to do a wellness check on the pt because he has not had dialysis in 8 days, pt has no complaints, states he was just napping and wanted to be left alone., normal dialysis Tuesday Thursday friday

## 2020-06-10 NOTE — Discharge Instructions (Signed)
It is critical that you go to your dialysis center tomorrow morning for dialysis.  If you are able to do so please return immediately to the emergency room.

## 2020-06-10 NOTE — ED Notes (Signed)
Emptied 64m out of pt's ostomy bag. Pt back in lobby with no other complaints at this time.

## 2020-06-13 ENCOUNTER — Observation Stay
Admission: EM | Admit: 2020-06-13 | Discharge: 2020-06-15 | Disposition: A | Discharge status: Home / Self Care | Payer: Medicaid Other | Attending: Internal Medicine | Admitting: Internal Medicine

## 2020-06-13 ENCOUNTER — Encounter: Payer: Self-pay | Admitting: Emergency Medicine

## 2020-06-13 ENCOUNTER — Other Ambulatory Visit: Payer: Self-pay

## 2020-06-13 DIAGNOSIS — I10 Essential (primary) hypertension: Secondary | ICD-10-CM | POA: Diagnosis not present

## 2020-06-13 DIAGNOSIS — Z7982 Long term (current) use of aspirin: Secondary | ICD-10-CM | POA: Insufficient documentation

## 2020-06-13 DIAGNOSIS — E875 Hyperkalemia: Secondary | ICD-10-CM | POA: Diagnosis not present

## 2020-06-13 DIAGNOSIS — Z79899 Other long term (current) drug therapy: Secondary | ICD-10-CM | POA: Insufficient documentation

## 2020-06-13 DIAGNOSIS — F17200 Nicotine dependence, unspecified, uncomplicated: Secondary | ICD-10-CM | POA: Diagnosis present

## 2020-06-13 DIAGNOSIS — N186 End stage renal disease: Secondary | ICD-10-CM | POA: Diagnosis present

## 2020-06-13 DIAGNOSIS — Z8673 Personal history of transient ischemic attack (TIA), and cerebral infarction without residual deficits: Secondary | ICD-10-CM | POA: Diagnosis not present

## 2020-06-13 DIAGNOSIS — I120 Hypertensive chronic kidney disease with stage 5 chronic kidney disease or end stage renal disease: Secondary | ICD-10-CM | POA: Insufficient documentation

## 2020-06-13 DIAGNOSIS — Z20822 Contact with and (suspected) exposure to covid-19: Secondary | ICD-10-CM | POA: Diagnosis not present

## 2020-06-13 DIAGNOSIS — F17210 Nicotine dependence, cigarettes, uncomplicated: Secondary | ICD-10-CM | POA: Insufficient documentation

## 2020-06-13 DIAGNOSIS — Z992 Dependence on renal dialysis: Secondary | ICD-10-CM | POA: Diagnosis not present

## 2020-06-13 DIAGNOSIS — R1110 Vomiting, unspecified: Secondary | ICD-10-CM | POA: Diagnosis present

## 2020-06-13 LAB — CBC
HCT: 32.6 % — ABNORMAL LOW (ref 39.0–52.0)
Hemoglobin: 10.6 g/dL — ABNORMAL LOW (ref 13.0–17.0)
MCH: 32 pg (ref 26.0–34.0)
MCHC: 32.5 g/dL (ref 30.0–36.0)
MCV: 98.5 fL (ref 80.0–100.0)
Platelets: 204 10*3/uL (ref 150–400)
RBC: 3.31 MIL/uL — ABNORMAL LOW (ref 4.22–5.81)
RDW: 18.2 % — ABNORMAL HIGH (ref 11.5–15.5)
WBC: 6.4 10*3/uL (ref 4.0–10.5)
nRBC: 0.9 % — ABNORMAL HIGH (ref 0.0–0.2)

## 2020-06-13 LAB — COMPREHENSIVE METABOLIC PANEL
ALT: 45 U/L — ABNORMAL HIGH (ref 0–44)
AST: 30 U/L (ref 15–41)
Albumin: 3.3 g/dL — ABNORMAL LOW (ref 3.5–5.0)
Alkaline Phosphatase: 59 U/L (ref 38–126)
Anion gap: 21 — ABNORMAL HIGH (ref 5–15)
BUN: 120 mg/dL — ABNORMAL HIGH (ref 6–20)
CO2: 15 mmol/L — ABNORMAL LOW (ref 22–32)
Calcium: 9.4 mg/dL (ref 8.9–10.3)
Chloride: 109 mmol/L (ref 98–111)
Creatinine, Ser: 14.55 mg/dL — ABNORMAL HIGH (ref 0.61–1.24)
GFR, Estimated: 4 mL/min — ABNORMAL LOW (ref >60)
Glucose, Bld: 97 mg/dL (ref 70–99)
Potassium: 6.5 mmol/L (ref 3.5–5.1)
Sodium: 145 mmol/L (ref 135–145)
Total Bilirubin: 1.6 mg/dL — ABNORMAL HIGH (ref 0.3–1.2)
Total Protein: 8.3 g/dL — ABNORMAL HIGH (ref 6.5–8.1)

## 2020-06-13 LAB — SARS CORONAVIRUS 2 BY RT PCR (HOSPITAL ORDER, PERFORMED IN ~~LOC~~ HOSPITAL LAB): SARS Coronavirus 2: NEGATIVE

## 2020-06-13 LAB — MAGNESIUM: Magnesium: 2.5 mg/dL — ABNORMAL HIGH (ref 1.7–2.4)

## 2020-06-13 LAB — LIPASE, BLOOD: Lipase: 37 U/L (ref 11–51)

## 2020-06-13 MED ORDER — CARVEDILOL 6.25 MG PO TABS
6.2500 mg | ORAL_TABLET | Freq: Two times a day (BID) | ORAL | Status: DC
  Administered 2020-06-13 – 2020-06-14 (×2): 6.25 mg via ORAL
  Filled 2020-06-13 (×4): qty 1

## 2020-06-13 MED ORDER — SODIUM CHLORIDE 0.9% FLUSH
3.0000 mL | Freq: Two times a day (BID) | INTRAVENOUS | Status: DC
  Administered 2020-06-13 – 2020-06-15 (×2): 3 mL via INTRAVENOUS

## 2020-06-13 MED ORDER — ACETAMINOPHEN 325 MG PO TABS: 650.0000 mg | ORAL_TABLET | Freq: Four times a day (QID) | ORAL | Status: DC | PRN

## 2020-06-13 MED ORDER — HYDROCODONE-ACETAMINOPHEN 5-325 MG PO TABS: 1.0000 | ORAL_TABLET | Freq: Four times a day (QID) | ORAL | Status: DC | PRN

## 2020-06-13 MED ORDER — ONDANSETRON HCL 4 MG/2ML IJ SOLN
4.0000 mg | Freq: Four times a day (QID) | INTRAMUSCULAR | Status: DC | PRN
  Administered 2020-06-13: 4 mg via INTRAVENOUS
  Filled 2020-06-13: qty 2

## 2020-06-13 MED ORDER — ONDANSETRON HCL 4 MG/2ML IJ SOLN
4.0000 mg | Freq: Once | INTRAMUSCULAR | Status: AC
  Administered 2020-06-13: 4 mg via INTRAVENOUS
  Filled 2020-06-13: qty 2

## 2020-06-13 MED ORDER — DOCUSATE SODIUM 100 MG PO CAPS
100.0000 mg | ORAL_CAPSULE | Freq: Two times a day (BID) | ORAL | Status: DC
  Administered 2020-06-13 – 2020-06-14 (×2): 100 mg via ORAL
  Filled 2020-06-13 (×3): qty 1

## 2020-06-13 MED ORDER — INSULIN ASPART 100 UNIT/ML IV SOLN
5.0000 [IU] | Freq: Once | INTRAVENOUS | Status: AC
  Administered 2020-06-13: 5 [IU] via INTRAVENOUS
  Filled 2020-06-13: qty 0.05

## 2020-06-13 MED ORDER — SODIUM CHLORIDE 0.9 % IV SOLN: 250.0000 mL | INTRAVENOUS | Status: DC | PRN

## 2020-06-13 MED ORDER — SODIUM BICARBONATE 8.4 % IV SOLN
50.0000 meq | Freq: Once | INTRAVENOUS | Status: AC
  Administered 2020-06-13: 50 meq via INTRAVENOUS
  Filled 2020-06-13: qty 50

## 2020-06-13 MED ORDER — HEPARIN SODIUM (PORCINE) 5000 UNIT/ML IJ SOLN
5000.0000 [IU] | Freq: Three times a day (TID) | INTRAMUSCULAR | Status: DC
  Filled 2020-06-13 (×4): qty 1

## 2020-06-13 MED ORDER — SEVELAMER CARBONATE 0.8 G PO PACK
0.8000 g | PACK | Freq: Three times a day (TID) | ORAL | Status: DC
  Administered 2020-06-14 (×2): 0.8 g via ORAL
  Filled 2020-06-13 (×7): qty 1

## 2020-06-13 MED ORDER — SODIUM ZIRCONIUM CYCLOSILICATE 10 G PO PACK
10.0000 g | PACK | Freq: Once | ORAL | Status: DC
  Filled 2020-06-13 (×2): qty 1

## 2020-06-13 MED ORDER — DEXTROSE 50 % IV SOLN
1.0000 | Freq: Once | INTRAVENOUS | Status: AC
  Administered 2020-06-13: 50 mL via INTRAVENOUS
  Filled 2020-06-13: qty 50

## 2020-06-13 MED ORDER — SODIUM CHLORIDE 0.9% FLUSH: 3.0000 mL | INTRAVENOUS | Status: DC | PRN

## 2020-06-13 MED ORDER — ACETAMINOPHEN 650 MG RE SUPP: 650.0000 mg | Freq: Four times a day (QID) | RECTAL | Status: DC | PRN

## 2020-06-13 MED ORDER — ONDANSETRON HCL 4 MG PO TABS: 4.0000 mg | ORAL_TABLET | Freq: Four times a day (QID) | ORAL | Status: DC | PRN

## 2020-06-13 MED ORDER — ASPIRIN EC 81 MG PO TBEC
81.0000 mg | DELAYED_RELEASE_TABLET | Freq: Every day | ORAL | Status: DC
  Administered 2020-06-13 – 2020-06-14 (×2): 81 mg via ORAL
  Filled 2020-06-13 (×3): qty 1

## 2020-06-13 NOTE — ED Notes (Signed)
[place] ED tech attempted to draw labs on pt. Pt is hard stick due to limited access. Pt has dialysis fistula in rt arm.

## 2020-06-13 NOTE — ED Provider Notes (Signed)
[facility] Emergency Department Provider Note  ____________________________________________   Event Date/Time   First MD Initiated Contact with Patient 06/13/20 1130     (approximate)  I have reviewed the triage vital signs and the nursing notes.   HISTORY  Chief Complaint Emesis    HPI Larry Morrison is a 50 y.o. male who is on dialysis with right arm fistula who comes in for vomiting.  Patient reports few episodes of nonbloody nonbilious vomiting that started today, intermittent, nothing makes better, nothing makes it worse.  Reports that was associated with some white food color.  Denies any abdominal pain, chest pain, shortness of breath.  States that he has been on antiemetics in the past but does not remember which kind.  He has a nephrostomy tube on the right with urine is coming out of it.  He states the urine is still coming out of that.  He does get dialysis and was last dialyzed on Friday.  He is due again for Monday.          Past Medical History:  Diagnosis Date  . Anemia   . 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 Dialysis patient ([place])   . Dyspnea   . Foot drop   . History of kidney stones   . History of nephrostomy   . Hypertension   . Renal disorder   . Stroke [facility])     Patient Active Problem List   Diagnosis Date Noted  . Anemia 09/17/2019  . Essential hypertension 07/15/2019  . Testicular torsion   . Pyuria 06/10/2016  . Chronic pain following surgery or procedure 12/01/2015  . History of nephrostomy 12/01/2015  . Tobacco use disorder 12/01/2015  . ESRD (end stage renal disease) ([place]) 04/28/2014  . Obstructed nephrostomy tube ([place]) 10/23/2013  . 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 Neurogenic bladder 02/09/1998  . Urinary calculus 02/09/1998  . 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 AV FISTULA PLACEMENT Right  08/07/2019   Procedure: ARTERIOVENOUS (AV) FISTULA CREATION;  Surgeon: Algernon Huxley, MD;  Location: [facility] ORS;  Service: Vascular;  Laterality: Right;  . AV FISTULA PLACEMENT Right 11/20/2019   Procedure: INSERTION OF ARTERIOVENOUS (AV) GORE-TEX GRAFT ARM;  Surgeon: Algernon Huxley, MD;  Location: [facility] ORS;  Service: Vascular;  Laterality: Right;  . NEPHRECTOMY Left   . NEPHRECTOMY    . ORCHIECTOMY Right 10/27/2016   Procedure: PSB ORCHIECTOMY;  Surgeon: Hollice Espy, MD;  Location: [facility] ORS;  Service: Urology;  Laterality: Right;  . ORCHIOPEXY Bilateral 10/27/2016   Procedure: ORCHIOPEXY ADULT;  Surgeon: Hollice Espy, MD;  Location: [facility] ORS;  Service: Urology;  Laterality: Bilateral;  . SCROTAL EXPLORATION Bilateral 10/27/2016   Procedure: SCROTUM EXPLORATION;  Surgeon: Hollice Espy, MD;  Location: [facility] ORS;  Service: Urology;  Laterality: Bilateral;    Prior to Admission medications   Medication Sig Start Date End Date Taking? Authorizing Provider  aspirin EC 81 MG tablet Take 1 tablet (81 mg total) by mouth daily. 11/20/19   Algernon Huxley, MD  carvedilol (COREG) 6.25 MG tablet Take 6.25 mg by mouth 2 (two) times daily. 10/28/19   [provider]  docusate sodium (COLACE) 100 MG capsule Take 1 capsule (100 mg total) by mouth 2 (two) times daily. 10/27/16   Hollice Espy, MD  HYDROcodone-acetaminophen (NORCO) 5-325 MG tablet Take 1 tablet by mouth  every 6 (six) hours as needed for moderate pain. 12/09/19   Kris Hartmann, NP  sevelamer carbonate (RENVELA) 0.8 g PACK packet Take by mouth.  10/31/19 10/30/20  [provider]    Allergies Vancomycin  Family History  Family history unknown: Yes    Social History Social History   Tobacco Use  . Smoking status: Current Some Day Smoker    Packs/day: 0.50    Types: Cigarettes  . Smokeless tobacco: Never Used  Vaping Use  . Vaping Use: Never used  Substance Use Topics  . Alcohol use: No  . Drug use: Yes    Types: Marijuana     Comment: today      Review of Systems Constitutional: No fever/chills Eyes: No visual changes. ENT: No sore throat. Cardiovascular: Denies chest pain. Respiratory: Denies shortness of breath. Gastrointestinal: No abdominal pain.  Positive nausea Genitourinary: Negative for dysuria. Musculoskeletal: Negative for back pain. Skin: Negative for rash. Neurological: Negative for headaches, focal weakness or numbness. All other ROS negative ____________________________________________   PHYSICAL EXAM:  VITAL SIGNS: ED Triage Vitals  Enc Vitals Group     BP 06/13/20 1023 (!) 167/99     Pulse Rate 06/13/20 1023 94     Resp 06/13/20 1023 16     Temp 06/13/20 1023 97.7 F (36.5 C)     Temp Source 06/13/20 1023 Oral     SpO2 06/13/20 1023 99 %     Weight --      Height --      Head Circumference --      Peak Flow --      Pain Score 06/13/20 1034 0     Pain Loc --      Pain Edu? --      Excl. in [place]? --     Constitutional: Alert and oriented. Well appearing and in no acute distress. Eyes: Conjunctivae are normal. EOMI. Head: Atraumatic. Nose: No congestion/rhinnorhea. Mouth/Throat: Mucous membranes are moist.   Neck: No stridor. Trachea Midline. FROM Cardiovascular: Normal rate, regular rhythm. Grossly normal heart sounds.  Good peripheral circulation. Respiratory: Normal respiratory effort.  No retractions. Lungs CTAB. Gastrointestinal: Soft and nontender. No distention. No abdominal bruits.  Musculoskeletal: No lower extremity tenderness nor edema.  No joint effusions.  Fistula right arm. Neurologic: Some aphasia but patient states that this is baseline from prior stroke no gross focal neurologic deficits are appreciated.  Skin:  Skin is warm, dry and intact. No rash noted. Psychiatric: Mood and affect are normal.  Aphasia GU: Deferred  Back: PCN draining out of the right flank. ____________________________________________   LABS (all labs ordered are listed, but  only abnormal results are displayed)  Labs Reviewed  LIPASE, BLOOD  COMPREHENSIVE METABOLIC PANEL  CBC  URINALYSIS, COMPLETE (UACMP) WITH MICROSCOPIC   ____________________________________________   ED ECG REPORT I, Vanessa [place], the attending physician, personally viewed and interpreted this ECG.  EKG my interpretation sinus rate of 86, no ST elevation, no T wave inversions, QTC is 536 does have some artifact due to patient moving around ____________________________________________  RADIOLOGY   PROCEDURES  Procedure(s) performed (including Critical Care):  Ultrasound ED Peripheral IV (Provider)  Date/Time: 06/13/2020 11:52 AM Performed by: Vanessa [place], MD Authorized by: Vanessa [place], MD   Procedure details:    Indications: hydration     Skin Prep: chlorhexidine gluconate     Location:  Right AC   Angiocath:  20 G   Bedside Ultrasound Guided: Yes  Images: not archived     Patient tolerated procedure without complications: Yes     Dressing applied: Yes    .Critical Care Performed by: Vanessa [place], MD Authorized by: Vanessa [place], MD   Critical care provider statement:    Critical care time (minutes):  45   Critical care was necessary to treat or prevent imminent or life-threatening deterioration of the following conditions:  Renal failure   Critical care was time spent personally by me on the following activities:  Discussions with consultants, evaluation of patient's response to treatment, examination of patient, ordering and performing treatments and interventions, ordering and review of laboratory studies, ordering and review of radiographic studies, pulse oximetry, re-evaluation of patient's condition, obtaining history from patient or surrogate and review of old charts     ____________________________________________   INITIAL IMPRESSION / ASSESSMENT AND PLAN / ED COURSE  Huck Bellanti was evaluated in Emergency Department on 06/13/2020 for the  symptoms described in the history of present illness. He was evaluated in the context of the global COVID-19 pandemic, which necessitated consideration that the patient might be at risk for infection with the SARS-CoV-2 virus that causes COVID-19. Institutional protocols and algorithms that pertain to the evaluation of patients at risk for COVID-19 are in a state of rapid change based on information released by regulatory bodies including the CDC and federal and state organizations. These policies and algorithms were followed during the patient's care in the ED.    Patient is a 50 year old who comes in with nausea and vomiting.  Patient is very well-appearing and his vital signs are normal.  Patient comes in with nausea and vomiting.  Will get labs to evaluate for Electra abnormalities, AKI given he is a dialysis patient.  His abdomen is soft and nontender and I have low suspicion for acute abdominal process at this time.  He denies any chest pain to suggest ACS.  He has a PCN drain on the right side that is draining urine.  Appears to be slightly pulled out but given it still draining urine we will stick put a better dressing on it.  Discussed with Dr. Juleen China who recommends 10 mg of Lokelma and shifting the K.  They are not sure if he will be able to get into dialysis today.  Recommend admission to the hospital team  Patient has not had dialysis since 1/12.  Even though patient told me it was last on Friday.  They will try to get him dialysis today.  EKG does not show any peaked T waves we will hold off on calcium at this time.  Patient will require admission for the above     ____________________________________________   FINAL CLINICAL IMPRESSION(S) / ED DIAGNOSES   Final diagnoses:  ESRD (end stage renal disease) ([place])  Hyperkalemia      MEDICATIONS GIVEN DURING THIS VISIT:  Medications  insulin aspart (novoLOG) injection 5 Units (has no administration in time range)    And   dextrose 50 % solution 50 mL (has no administration in time range)  sodium zirconium cyclosilicate (LOKELMA) packet 10 g (has no administration in time range)  ondansetron (ZOFRAN) injection 4 mg (4 mg Intravenous Given 06/13/20 1159)  sodium bicarbonate injection 50 mEq (50 mEq Intravenous Given 06/13/20 1322)     ED Discharge Orders    None       Note:  This document was prepared using Dragon voice recognition software and may include unintentional dictation errors.   Jari Pigg,  Royetta Crochet, MD 06/13/20 1329

## 2020-06-13 NOTE — ED Notes (Signed)
Kidney (or ureteral?) drain noted to be ~4-5cm out from original sutures; pt states it is still draining urine. Transparent opsite placed to prevent futher exiting of drain tube

## 2020-06-13 NOTE — Progress Notes (Signed)
[facility]  ROUNDING NOTE   Subjective:   The patient has not been to outpatient dialysis since 1/12.  The patient reports he was still feeling sick on Friday after he was discharged from the ED on Thursday.  He denies any shortness of breath, edema, cramping.   Objective:  Vital signs in last 24 hours:  Temp:  [97.7 F (36.5 C)-97.8 F (36.6 C)] 97.8 F (36.6 C) (01/23 1215) Pulse Rate:  [86-94] 86 (01/23 1215) Resp:  [16] 16 (01/23 1215) BP: (167)/(99-100) 167/100 (01/23 1215) SpO2:  [99 %] 99 % (01/23 1215)  Weight change:  There were no vitals filed for this visit.  Intake/Output: No intake/output data recorded.   Intake/Output this shift:  No intake/output data recorded.  Physical Exam: General: NAD,   Head: Normocephalic, atraumatic. Moist oral mucosal membranes  Eyes: Anicteric, PERRL  Neck: Supple, trachea midline  Lungs:  Diminished.   Heart: Regular rate and rhythm  Abdomen:  Soft, nontender,   Extremities:  No peripheral edema.  Neurologic: Alert and oriented   Skin: No lesions  Access: Right upper AVF, recently declotted     Basic Metabolic Panel: Recent Labs  Lab 06/10/20 1125 06/13/20 1146  NA 145 145  K 5.9* 6.5*  CL 110 109  CO2 16* 15*  GLUCOSE 96 97  BUN 103* 120*  CREATININE 12.96* 14.55*  CALCIUM 8.9 9.4  MG  --  2.5*    Liver Function Tests: Recent Labs  Lab 06/13/20 1146  AST 30  ALT 45*  ALKPHOS 59  BILITOT 1.6*  PROT 8.3*  ALBUMIN 3.3*   Recent Labs  Lab 06/13/20 1146  LIPASE 37   No results for input(s): AMMONIA in the last 168 hours.  CBC: Recent Labs  Lab 06/10/20 1125 06/13/20 1146  WBC 6.6 6.4  HGB 9.5* 10.6*  HCT 29.7* 32.6*  MCV 97.7 98.5  PLT 161 204    Cardiac Enzymes: No results for input(s): CKTOTAL, CKMB, CKMBINDEX, TROPONINI in the last 168 hours.  BNP: Invalid input(s): POCBNP  CBG: No results for input(s): GLUCAP in the last 168 hours.  Microbiology: Results for  orders placed or performed during the hospital encounter of 02/17/20  SARS CORONAVIRUS 2 (TAT 6-24 HRS) Nasopharyngeal Nasopharyngeal Swab     Status: None   Collection Time: 02/17/20 10:26 AM   Specimen: Nasopharyngeal Swab  Result Value Ref Range Status   SARS Coronavirus 2 NEGATIVE NEGATIVE Final    Comment: (NOTE) SARS-CoV-2 target nucleic acids are NOT DETECTED.  The SARS-CoV-2 RNA is generally detectable in upper and lower respiratory specimens during the acute phase of infection. Negative results do not preclude SARS-CoV-2 infection, do not rule out co-infections with other pathogens, and should not be used as the sole basis for treatment or other patient management decisions. Negative results must be combined with clinical observations, patient history, and epidemiological information. The expected result is Negative.  Fact Sheet for Patients: SugarRoll.be  Fact Sheet for Healthcare Providers: https://www.woods-mathews.com/  This test is not yet approved or cleared by the [place] FDA and  has been authorized for detection and/or diagnosis of SARS-CoV-2 by FDA under an Emergency Use Authorization (EUA). This EUA will remain  in effect (meaning this test can be used) for the duration of the COVID-19 declaration under Se ction 564(b)(1) of the Act, 21 U.S.C. section 360bbb-3(b)(1), unless the authorization is terminated or revoked sooner.  Performed at [facility], [place] [address] [place], [place] [address]  Coagulation Studies: No results for input(s): LABPROT, INR in the last 72 hours.  Urinalysis: No results for input(s): COLORURINE, LABSPEC, PHURINE, GLUCOSEU, HGBUR, BILIRUBINUR, KETONESUR, PROTEINUR, UROBILINOGEN, NITRITE, LEUKOCYTESUR in the last 72 hours.  Invalid input(s): APPERANCEUR    Imaging: No results found.   Medications:   . sodium chloride     . aspirin EC  81 mg Oral Daily  .  carvedilol  6.25 mg Oral BID  . docusate sodium  100 mg Oral BID  . heparin  5,000 Units Subcutaneous Q8H  . sevelamer carbonate  0.8 g Oral TID WC  . sodium chloride flush  3 mL Intravenous Q12H  . sodium zirconium cyclosilicate  10 g Oral Once   sodium chloride, acetaminophen **OR** acetaminophen, HYDROcodone-acetaminophen, ondansetron **OR** ondansetron (ZOFRAN) IV, sodium chloride flush  Assessment/ Plan:  [name] is a 50 y.o.  male with ESRD on HD, HTN, stroke, right nephrostomy tube  who presented to the ED for nausea and vomiting found to be hyperkalemic.   1. ESRD on HD  - missed multiple dialysis sessions  - will plan for HD today, 1K bath    2. Hyperkalemia  - potassium 6.5 - lokelma 10g given in the ED  - place patient on 1 K bath.   3. Hypertension , BP not controlled  - continue on carvedilol   4. Secondary hyperparathyroidism  - patient on renvela powder as an outpatient, not compliant with this medication    LOS: [place] 1/23/20222:32 PM

## 2020-06-13 NOTE — ED Triage Notes (Signed)
Pt to ED via [facility] from home for emesis. Pt reports that he has had 3-4 episodes of vomiting this morning. Pt denies any other symptoms. Pt is in NAD.

## 2020-06-13 NOTE — ED Notes (Signed)
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 Pt was supposed to go to [facility] on Monday to have stent replaced. Pt has stent replaced every 3 months.

## 2020-06-13 NOTE — ED Notes (Signed)
First Nurse Note: Pt to ED via [facility] from home for weakness. VSS per EMS

## 2020-06-13 NOTE — ED Notes (Signed)
Called and spoke with [place] in the lab, they will send someone to come draw pts labs

## 2020-06-13 NOTE — H&P (Signed)
History and Physical    Larry Morrison [id number] DOB: Mar 08, 1971 DOA: 06/13/2020  PCP: Patient, No Pcp Per   Patient coming from: Home  I have personally briefly reviewed patient's old medical records in [place]  Chief Complaint: Nausea/Vomiting  HPI: Larry Morrison is a 50 y.o. male with medical history significant for end-stage renal disease on hemodialysis, history of nephrolithiasis, CVA, hypertension who presents to the ER via EMS for evaluation of nausea and vomiting.  Patient reports multiple episodes of nonbloody, nonbilious emesis but denies having any changes in his bowel habits or having abdominal pain.  He denies having any aggravating or relieving factors and has had no improvement on oral antiemetics. Patient was seen in the emergency room 3 days ago for a wellness check after he had missed dialysis for over a week.  He was discharged home and advised to go to his dialysis appointment the next day.  Patient did not keep the dialysis appointment. He denies having any chest pain, no palpitations, no diaphoresis, no diarrhea, no constipation, no fever, no chills, no cough, no dizziness or lightheadedness. No blurred vision, no dysphagia. Labs show sodium 145, potassium 6.5, chloride 109, bicarb 15, glucose 97, BUN 20, creatinine 14.5, calcium 9.4, magnesium 2.5, alkaline phosphatase 59, albumin 3.3, lipase 37, AST 30, ALT 45, total protein 8.3, total bili 1.6, white count 6.4, hemoglobin 10.6, hematocrit 32.6, MCV 98.5, RDW 18.2, platelet count 204 His SARS coronavirus 2 PCR test is pending 12 Lead EKG shows SR with non specific T wave changes     ED Course: Patient is a 50 year old male with a history of end-stage renal disease on hemodialysis who presents for evaluation of nausea and vomiting. Patient has missed multiple dialysis sessions and has hyperkalemia. He will be admitted to the hospital for further evaluation.  Review of Systems: As per HPI otherwise all systems  reviewed and negative.   Past Medical History:  Diagnosis Date  . Anemia   . 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 Dialysis patient ([place])   . Dyspnea   . Foot drop   . History of kidney stones   . History of nephrostomy   . Hypertension   . Renal disorder   . Stroke [facility])     Past Surgical History:  Procedure Laterality Date  . AV FISTULA PLACEMENT Right 08/07/2019   Procedure: ARTERIOVENOUS (AV) FISTULA CREATION;  Surgeon: Algernon Huxley, MD;  Location: [facility] ORS;  Service: Vascular;  Laterality: Right;  . AV FISTULA PLACEMENT Right 11/20/2019   Procedure: INSERTION OF ARTERIOVENOUS (AV) GORE-TEX GRAFT ARM;  Surgeon: Algernon Huxley, MD;  Location: [facility] ORS;  Service: Vascular;  Laterality: Right;  . NEPHRECTOMY Left   . NEPHRECTOMY    . ORCHIECTOMY Right 10/27/2016   Procedure: PSB ORCHIECTOMY;  Surgeon: Hollice Espy, MD;  Location: [facility] ORS;  Service: Urology;  Laterality: Right;  . ORCHIOPEXY Bilateral 10/27/2016   Procedure: ORCHIOPEXY ADULT;  Surgeon: Hollice Espy, MD;  Location: [facility] ORS;  Service: Urology;  Laterality: Bilateral;  . SCROTAL EXPLORATION Bilateral 10/27/2016   Procedure: SCROTUM EXPLORATION;  Surgeon: Hollice Espy, MD;  Location: [facility] ORS;  Service: Urology;  Laterality: Bilateral;     reports that he has been smoking cigarettes. He has been smoking about 0.50 packs per day. He has never used smokeless tobacco. He reports current drug use. Drug: Marijuana. He reports that he does not drink alcohol.  Allergies  Allergen Reactions  . Vancomycin  Patient denies    Family History  Family history unknown: Yes     Prior to Admission medications   Medication Sig Start Date End Date Taking? Authorizing Provider  aspirin EC 81 MG tablet Take 1 tablet (81 mg total) by mouth daily. 11/20/19   Algernon Huxley, MD  carvedilol (COREG) 6.25 MG tablet Take 6.25 mg by mouth 2 (two) times daily. 10/28/19   [provider]  docusate sodium (COLACE) 100 MG capsule  Take 1 capsule (100 mg total) by mouth 2 (two) times daily. 10/27/16   Hollice Espy, MD  HYDROcodone-acetaminophen (NORCO) 5-325 MG tablet Take 1 tablet by mouth every 6 (six) hours as needed for moderate pain. 12/09/19   Kris Hartmann, NP  sevelamer carbonate (RENVELA) 0.8 g PACK packet Take by mouth.  10/31/19 10/30/20  [provider]    Physical Exam: Vitals:   06/13/20 1023 06/13/20 1215  BP: (!) 167/99 (!) 167/100  Pulse: 94 86  Resp: 16 16  Temp: 97.7 F (36.5 C) 97.8 F (36.6 C)  TempSrc: Oral   SpO2: 99% 99%     Vitals:   06/13/20 1023 06/13/20 1215  BP: (!) 167/99 (!) 167/100  Pulse: 94 86  Resp: 16 16  Temp: 97.7 F (36.5 C) 97.8 F (36.6 C)  TempSrc: Oral   SpO2: 99% 99%    Constitutional: NAD, alert and oriented x 3. Chronically ill appearing, weak, conversational dyspnea Eyes: PERRL, lids and conjunctivae pallor ENMT: Mucous membranes are moist.  Neck: normal, supple, no masses, no thyromegaly Respiratory: clear to auscultation bilaterally, no wheezing, no crackles. Normal respiratory effort. No accessory muscle use.  Cardiovascular: Regular rate and rhythm, no murmurs / rubs / gallops. No extremity edema. 2+ pedal pulses. No carotid bruits.  Abdomen: no tenderness, no masses palpated. No hepatosplenomegaly. Bowel sounds positive.  Musculoskeletal: no clubbing / cyanosis. No joint deformity upper and lower extremities.  Skin: no rashes, lesions, ulcers.  Neurologic: No gross focal neurologic deficit. Generalized weakness Psychiatric: Normal mood and affect.   Labs on Admission: I have personally reviewed following labs and imaging studies  CBC: Recent Labs  Lab 06/10/20 1125 06/13/20 1146  WBC 6.6 6.4  HGB 9.5* 10.6*  HCT 29.7* 32.6*  MCV 97.7 98.5  PLT 161 0000000   Basic Metabolic Panel: Recent Labs  Lab 06/10/20 1125 06/13/20 1146  NA 145 145  K 5.9* 6.5*  CL 110 109  CO2 16* 15*  GLUCOSE 96 97  BUN 103* 120*  CREATININE 12.96*  14.55*  CALCIUM 8.9 9.4  MG  --  2.5*   GFR: Estimated Creatinine Clearance: 4.9 mL/min (A) (by C-G formula based on SCr of 14.55 mg/dL (H)). Liver Function Tests: Recent Labs  Lab 06/13/20 1146  AST 30  ALT 45*  ALKPHOS 59  BILITOT 1.6*  PROT 8.3*  ALBUMIN 3.3*   Recent Labs  Lab 06/13/20 1146  LIPASE 37   No results for input(s): AMMONIA in the last 168 hours. Coagulation Profile: No results for input(s): INR, PROTIME in the last 168 hours. Cardiac Enzymes: No results for input(s): CKTOTAL, CKMB, CKMBINDEX, TROPONINI in the last 168 hours. BNP (last 3 results) No results for input(s): PROBNP in the last 8760 hours. HbA1C: No results for input(s): HGBA1C in the last 72 hours. CBG: No results for input(s): GLUCAP in the last 168 hours. Lipid Profile: No results for input(s): CHOL, HDL, LDLCALC, TRIG, CHOLHDL, LDLDIRECT in the last 72 hours. Thyroid Function Tests: No results  for input(s): TSH, T4TOTAL, FREET4, T3FREE, THYROIDAB in the last 72 hours. Anemia Panel: No results for input(s): VITAMINB12, FOLATE, FERRITIN, TIBC, IRON, RETICCTPCT in the last 72 hours. Urine analysis:    Component Value Date/Time   COLORURINE AMBER (A) 04/28/2017 1032   APPEARANCEUR TURBID (A) 04/28/2017 1032   APPEARANCEUR Turbid 09/19/2014 1914   LABSPEC 1.011 04/28/2017 1032   LABSPEC 1.011 09/19/2014 1914   PHURINE 7.0 04/28/2017 1032   GLUCOSEU NEGATIVE 04/28/2017 1032   GLUCOSEU Negative 09/19/2014 1914   HGBUR MODERATE (A) 04/28/2017 1032   BILIRUBINUR NEGATIVE 04/28/2017 1032   BILIRUBINUR Negative 09/19/2014 1914   KETONESUR NEGATIVE 04/28/2017 1032   PROTEINUR 100 (A) 04/28/2017 1032   NITRITE POSITIVE (A) 04/28/2017 1032   LEUKOCYTESUR MODERATE (A) 04/28/2017 1032   LEUKOCYTESUR 2+ 09/19/2014 1914    Radiological Exams on Admission: No results found.  EKG: Independently reviewed.  Sinus rhythm Nonspecific T wave changes  Assessment/Plan Principal Problem:    Hyperkalemia Active Problems:   ESRD (end stage renal disease) (HCC)   Tobacco use disorder   Essential hypertension      ESRD on hemodialysis  Patient has end stage renal disease and is on dialysis. Patient missed multiple dialysis sessions and presents with uremic symptoms, hyperkalemia as well as metabolic acidosis. Patient received dextrose, insulin and Lokelma in the ER Nephrology has been consulted for renal replacement therapy    Hypertension Continue Carvedilol     Nicotine Dependence Smoking cessation has been discussed with patient in detail He declines a nicotine transdermal patch at this time    DVT prophylaxis: Heparin Code Status: Full code Family Communication: Greater than 50% of time was spent discussing plan of care with patient at the bedside. All questions and concerns have been addressed. He verbalizes understanding and agrees with the plan. Disposition Plan: Back to previous home environment Consults called: Nephrology    Collier Bullock MD [facility]     06/13/2020, 2:08 PM

## 2020-06-13 NOTE — ED Notes (Signed)
Meal tray at bedside.  

## 2020-06-14 DIAGNOSIS — E875 Hyperkalemia: Secondary | ICD-10-CM | POA: Diagnosis not present

## 2020-06-14 DIAGNOSIS — N186 End stage renal disease: Secondary | ICD-10-CM | POA: Diagnosis not present

## 2020-06-14 LAB — CBC
HCT: 28.1 % — ABNORMAL LOW (ref 39.0–52.0)
Hemoglobin: 9.3 g/dL — ABNORMAL LOW (ref 13.0–17.0)
MCH: 32.4 pg (ref 26.0–34.0)
MCHC: 33.1 g/dL (ref 30.0–36.0)
MCV: 97.9 fL (ref 80.0–100.0)
Platelets: 134 K/uL — ABNORMAL LOW (ref 150–400)
RBC: 2.87 MIL/uL — ABNORMAL LOW (ref 4.22–5.81)
RDW: 17.8 % — ABNORMAL HIGH (ref 11.5–15.5)
WBC: 7.1 K/uL (ref 4.0–10.5)
nRBC: 0.7 % — ABNORMAL HIGH (ref 0.0–0.2)

## 2020-06-14 LAB — MAGNESIUM: Magnesium: 2.1 mg/dL (ref 1.7–2.4)

## 2020-06-14 LAB — HIV ANTIBODY (ROUTINE TESTING W REFLEX): HIV Screen 4th Generation wRfx: NONREACTIVE

## 2020-06-14 NOTE — ED Notes (Signed)
Pt taken to dialysis by transport

## 2020-06-14 NOTE — ED Notes (Signed)
Pt placed on bedpan

## 2020-06-14 NOTE — Discharge Summary (Signed)
Physician Discharge Summary   Larry Morrison [id number] DOB: 1971/02/14 DOA: 06/13/2020  PCP: Patient, No Pcp Per  Admit date: 06/13/2020 Discharge date: 06/14/2020  Admitted From: home Disposition:  home Discharging physician: Dwyane Dee, MD  Recommendations for Outpatient Follow-up:  1. Resume outpatient dialysis sessions as scheduled   Patient discharged to home in Discharge Condition: stable CODE STATUS: Full Diet recommendation:  Diet Orders (From admission, onward)    Start     Ordered   06/14/20 0836  Diet renal [id number] Fluid restriction: 1200 mL Fluid; Room service appropriate? Yes; Fluid consistency: Thin  Diet effective now       Question Answer Comment  Fluid restriction: 1200 mL Fluid   Room service appropriate? Yes   Fluid consistency: Thin      06/14/20 0835          Hospital Course: Larry Morrison is a 50 year old male with PMH ESRD on HD, CVA, hypertension, noncompliance who presented to the ER with nausea and vomiting.  He had missed several sessions of dialysis. On work-up he was found to be in worsening renal failure and after evaluation by nephrology was taken for urgent dialysis. He underwent a repeat session on 06/14/2020. He was instructed to continue compliance with outpatient dialysis sessions.    The patient's chronic medical conditions were treated accordingly per the patient's home medication regimen except as noted.  On day of discharge, patient was felt deemed stable for discharge. Patient/family member advised to call PCP or come back to ER if needed.   Principal Diagnosis: Hyperkalemia  Discharge Diagnoses: Active Hospital Problems   Diagnosis Date Noted  . Hyperkalemia 06/13/2020  . Essential hypertension 07/15/2019  . Tobacco use disorder 12/01/2015  . ESRD (end stage renal disease) ([place]) 04/28/2014    Resolved Hospital Problems  No resolved problems to display.    Discharge Instructions    Increase activity slowly    Complete by: As directed      Allergies as of 06/14/2020      Reactions   Vancomycin    Patient denies      Medication List    TAKE these medications   aspirin EC 81 MG tablet Take 1 tablet (81 mg total) by mouth daily.   lidocaine-prilocaine cream Commonly known as: EMLA Apply 1 application topically 5 (five) times daily.   sevelamer carbonate 0.8 g Pack packet Commonly known as: RENVELA Take by mouth.       Allergies  Allergen Reactions  . Vancomycin     Patient denies    Consultations: Nephrology  Discharge Exam: BP 111/67   Pulse 73   Temp 98.7 F (37.1 C) (Oral)   Resp 16   SpO2 97%  General appearance: alert, cooperative and no distress Head: Normocephalic, without obvious abnormality, atraumatic Eyes: EOMI Lungs: clear to auscultation bilaterally Heart: regular rate and rhythm and S1, S2 normal Abdomen: normal findings: bowel sounds normal Extremities: Fistula noted in right upper extremity with good thrill Skin: mobility and turgor normal Neurologic: Grossly normal  The results of significant diagnostics from this hospitalization (including imaging, microbiology, ancillary and laboratory) are listed below for reference.   Microbiology: Recent Results (from the past 240 hour(s))  SARS Coronavirus 2 by RT PCR (hospital order, performed in [facility] hospital lab) Nasopharyngeal Nasopharyngeal Swab     Status: None   Collection Time: 06/13/20  1:06 PM   Specimen: Nasopharyngeal Swab  Result Value Ref Range Status   SARS Coronavirus 2 NEGATIVE NEGATIVE Final  Comment: (NOTE) SARS-CoV-2 target nucleic acids are NOT DETECTED.  The SARS-CoV-2 RNA is generally detectable in upper and lower respiratory specimens during the acute phase of infection. The lowest concentration of SARS-CoV-2 viral copies this assay can detect is 250 copies / mL. A negative result does not preclude SARS-CoV-2 infection and should not be used as the sole basis for  treatment or other patient management decisions.  A negative result may occur with improper specimen collection / handling, submission of specimen other than nasopharyngeal swab, presence of viral mutation(s) within the areas targeted by this assay, and inadequate number of viral copies (<250 copies / mL). A negative result must be combined with clinical observations, patient history, and epidemiological information.  Fact Sheet for Patients:   StrictlyIdeas.no  Fact Sheet for Healthcare Providers: BankingDealers.co.za  This test is not yet approved or  cleared by the [place] FDA and has been authorized for detection and/or diagnosis of SARS-CoV-2 by FDA under an Emergency Use Authorization (EUA).  This EUA will remain in effect (meaning this test can be used) for the duration of the COVID-19 declaration under Section 564(b)(1) of the Act, 21 U.S.C. section 360bbb-3(b)(1), unless the authorization is terminated or revoked sooner.  Performed at [facility], [address] [place] [address], [place] [address]      Labs: BNP (last 3 results) No results for input(s): BNP in the last 8760 hours. Basic Metabolic Panel: Recent Labs  Lab 06/10/20 1125 06/13/20 1146 06/14/20 0926  NA 145 145  --   K 5.9* 6.5*  --   CL 110 109  --   CO2 16* 15*  --   GLUCOSE 96 97  --   BUN 103* 120*  --   CREATININE 12.96* 14.55*  --   CALCIUM 8.9 9.4  --   MG  --  2.5* 2.1   Liver Function Tests: Recent Labs  Lab 06/13/20 1146  AST 30  ALT 45*  ALKPHOS 59  BILITOT 1.6*  PROT 8.3*  ALBUMIN 3.3*   Recent Labs  Lab 06/13/20 1146  LIPASE 37   No results for input(s): AMMONIA in the last 168 hours. CBC: Recent Labs  Lab 06/10/20 1125 06/13/20 1146 06/14/20 0926  WBC 6.6 6.4 7.1  HGB 9.5* 10.6* 9.3*  HCT 29.7* 32.6* 28.1*  MCV 97.7 98.5 97.9  PLT 161 204 134*   Cardiac Enzymes: No results for input(s): CKTOTAL, CKMB,  CKMBINDEX, TROPONINI in the last 168 hours. BNP: Invalid input(s): POCBNP CBG: No results for input(s): GLUCAP in the last 168 hours. D-Dimer No results for input(s): DDIMER in the last 72 hours. Hgb A1c No results for input(s): HGBA1C in the last 72 hours. Lipid Profile No results for input(s): CHOL, HDL, LDLCALC, TRIG, CHOLHDL, LDLDIRECT in the last 72 hours. Thyroid function studies No results for input(s): TSH, T4TOTAL, T3FREE, THYROIDAB in the last 72 hours.  Invalid input(s): FREET3 Anemia work up No results for input(s): VITAMINB12, FOLATE, FERRITIN, TIBC, IRON, RETICCTPCT in the last 72 hours. Urinalysis    Component Value Date/Time   COLORURINE AMBER (A) 04/28/2017 1032   APPEARANCEUR TURBID (A) 04/28/2017 1032   APPEARANCEUR Turbid 09/19/2014 1914   LABSPEC 1.011 04/28/2017 1032   LABSPEC 1.011 09/19/2014 1914   PHURINE 7.0 04/28/2017 1032   GLUCOSEU NEGATIVE 04/28/2017 1032   GLUCOSEU Negative 09/19/2014 1914   HGBUR MODERATE (A) 04/28/2017 1032   BILIRUBINUR NEGATIVE 04/28/2017 1032   BILIRUBINUR Negative 09/19/2014 [place] NEGATIVE 04/28/2017 1032  PROTEINUR 100 (A) 04/28/2017 1032   NITRITE POSITIVE (A) 04/28/2017 1032   LEUKOCYTESUR MODERATE (A) 04/28/2017 1032   LEUKOCYTESUR 2+ 09/19/2014 1914   Sepsis Labs Invalid input(s): PROCALCITONIN,  WBC,  LACTICIDVEN Microbiology Recent Results (from the past 240 hour(s))  SARS Coronavirus 2 by RT PCR (hospital order, performed in [facility] hospital lab) Nasopharyngeal Nasopharyngeal Swab     Status: None   Collection Time: 06/13/20  1:06 PM   Specimen: Nasopharyngeal Swab  Result Value Ref Range Status   SARS Coronavirus 2 NEGATIVE NEGATIVE Final    Comment: (NOTE) SARS-CoV-2 target nucleic acids are NOT DETECTED.  The SARS-CoV-2 RNA is generally detectable in upper and lower respiratory specimens during the acute phase of infection. The lowest concentration of SARS-CoV-2 viral copies this assay  can detect is 250 copies / mL. A negative result does not preclude SARS-CoV-2 infection and should not be used as the sole basis for treatment or other patient management decisions.  A negative result may occur with improper specimen collection / handling, submission of specimen other than nasopharyngeal swab, presence of viral mutation(s) within the areas targeted by this assay, and inadequate number of viral copies (<250 copies / mL). A negative result must be combined with clinical observations, patient history, and epidemiological information.  Fact Sheet for Patients:   StrictlyIdeas.no  Fact Sheet for Healthcare Providers: BankingDealers.co.za  This test is not yet approved or  cleared by the [place] FDA and has been authorized for detection and/or diagnosis of SARS-CoV-2 by FDA under an Emergency Use Authorization (EUA).  This EUA will remain in effect (meaning this test can be used) for the duration of the COVID-19 declaration under Section 564(b)(1) of the Act, 21 U.S.C. section 360bbb-3(b)(1), unless the authorization is terminated or revoked sooner.  Performed at [facility], [address] [place], [place] [address]     Procedures/Studies: No results found.   Time coordinating discharge: Over 30 minutes    Dwyane Dee, MD  [facility] 06/14/2020, 4:24 PM

## 2020-06-14 NOTE — Progress Notes (Signed)
[facility]  ROUNDING NOTE   Subjective:   Emergent hemodialysis treatment yesterday evening for hyperkalemia.   Objective:  Vital signs in last 24 hours:  Temp:  [97.8 F (36.6 C)-98.7 F (37.1 C)] 98.7 F (37.1 C) (01/24 1115) Pulse Rate:  [71-92] 73 (01/24 1121) Resp:  [16-20] 16 (01/24 1121) BP: (111-168)/(65-100) 111/67 (01/24 1121) SpO2:  [95 %-99 %] 97 % (01/24 1121)  Weight change:  There were no vitals filed for this visit.  Intake/Output: I/O last 3 completed shifts: In: -  Out: 60 [Emesis/NG output:60]   Intake/Output this shift:  Total I/O In: -  Out: 480 [Urine:480]  Physical Exam: General: NAD,   Head: Normocephalic, atraumatic. Moist oral mucosal membranes  Eyes: Anicteric, PERRL  Neck: Supple, trachea midline  Lungs:  Diminished.   Heart: Regular rate and rhythm  Abdomen:  Soft, nontender,   Extremities:  No peripheral edema.  Neurologic: Alert and oriented   Skin: No lesions  Access: Right upper AVF, recently declotted     Basic Metabolic Panel: Recent Labs  Lab 06/10/20 1125 06/13/20 1146 06/14/20 0926  NA 145 145  --   K 5.9* 6.5*  --   CL 110 109  --   CO2 16* 15*  --   GLUCOSE 96 97  --   BUN 103* 120*  --   CREATININE 12.96* 14.55*  --   CALCIUM 8.9 9.4  --   MG  --  2.5* 2.1    Liver Function Tests: Recent Labs  Lab 06/13/20 1146  AST 30  ALT 45*  ALKPHOS 59  BILITOT 1.6*  PROT 8.3*  ALBUMIN 3.3*   Recent Labs  Lab 06/13/20 1146  LIPASE 37   No results for input(s): AMMONIA in the last 168 hours.  CBC: Recent Labs  Lab 06/10/20 1125 06/13/20 1146 06/14/20 0926  WBC 6.6 6.4 7.1  HGB 9.5* 10.6* 9.3*  HCT 29.7* 32.6* 28.1*  MCV 97.7 98.5 97.9  PLT 161 204 134*    Cardiac Enzymes: No results for input(s): CKTOTAL, CKMB, CKMBINDEX, TROPONINI in the last 168 hours.  BNP: Invalid input(s): POCBNP  CBG: No results for input(s): GLUCAP in the last 168 hours.  Microbiology: Results for  orders placed or performed during the hospital encounter of 06/13/20  SARS Coronavirus 2 by RT PCR (hospital order, performed in [facility] hospital lab) Nasopharyngeal Nasopharyngeal Swab     Status: None   Collection Time: 06/13/20  1:06 PM   Specimen: Nasopharyngeal Swab  Result Value Ref Range Status   SARS Coronavirus 2 NEGATIVE NEGATIVE Final    Comment: (NOTE) SARS-CoV-2 target nucleic acids are NOT DETECTED.  The SARS-CoV-2 RNA is generally detectable in upper and lower respiratory specimens during the acute phase of infection. The lowest concentration of SARS-CoV-2 viral copies this assay can detect is 250 copies / mL. A negative result does not preclude SARS-CoV-2 infection and should not be used as the sole basis for treatment or other patient management decisions.  A negative result may occur with improper specimen collection / handling, submission of specimen other than nasopharyngeal swab, presence of viral mutation(s) within the areas targeted by this assay, and inadequate number of viral copies (<250 copies / mL). A negative result must be combined with clinical observations, patient history, and epidemiological information.  Fact Sheet for Patients:   StrictlyIdeas.no  Fact Sheet for Healthcare Providers: BankingDealers.co.za  This test is not yet approved or  cleared by the [place] FDA  and has been authorized for detection and/or diagnosis of SARS-CoV-2 by FDA under an Emergency Use Authorization (EUA).  This EUA will remain in effect (meaning this test can be used) for the duration of the COVID-19 declaration under Section 564(b)(1) of the Act, 21 U.S.C. section 360bbb-3(b)(1), unless the authorization is terminated or revoked sooner.  Performed at [facility], [address] [place], [place] [address]     Coagulation Studies: No results for input(s): LABPROT, INR in the last 72  hours.  Urinalysis: No results for input(s): COLORURINE, LABSPEC, PHURINE, GLUCOSEU, HGBUR, BILIRUBINUR, KETONESUR, PROTEINUR, UROBILINOGEN, NITRITE, LEUKOCYTESUR in the last 72 hours.  Invalid input(s): APPERANCEUR    Imaging: No results found.   Medications:   . sodium chloride     . aspirin EC  81 mg Oral Daily  . carvedilol  6.25 mg Oral BID  . docusate sodium  100 mg Oral BID  . heparin  5,000 Units Subcutaneous Q8H  . sevelamer carbonate  0.8 g Oral TID WC  . sodium chloride flush  3 mL Intravenous Q12H  . sodium zirconium cyclosilicate  10 g Oral Once   sodium chloride, acetaminophen **OR** acetaminophen, HYDROcodone-acetaminophen, ondansetron **OR** ondansetron (ZOFRAN) IV, sodium chloride flush  Assessment/ Plan:  [name] is a 50 y.o.  male with ESRD on HD, HTN, stroke, right nephrostomy tube  who presented to the ED for nausea and vomiting found to be hyperkalemic.   [facility] Left AVF 50.5kg  1. ESRD on HD with hyperkalemia - dialysis for today - lokelma  2. Anemia with chronic kidney disease: hemoglobin 9.3 - EPO with HD treatments.   3. Hypertension:  - continue on carvedilol   4. Secondary hyperparathyroidism  - patient on renvela powder as an outpatient   LOS: 0 Larry Morrison 1/24/20222:51 PM

## 2020-06-14 NOTE — ED Notes (Signed)
Pt states he lives at home by himself. States does not have anyone to come pick him up when up for d/c. Says that he could take a cab home, but cannot pay for it and would need a voucher

## 2020-06-14 NOTE — ED Notes (Signed)
Pt given a sandwich tray ?

## 2020-06-14 NOTE — ED Notes (Signed)
Pt given graham crackers, peanut butter, and a cup of vanilla ice cream.

## 2020-06-14 NOTE — ED Notes (Signed)
Patient refused the heparin injection and says "no sticking"  He is saying he wants real food and not liquids offered.  md informed and we will order diet. Lab came to draw and he refused to let them draw blood.

## 2020-06-14 NOTE — ED Notes (Signed)
Lab called for AM blood draw.

## 2020-06-15 LAB — CBC
HCT: 25.7 % — ABNORMAL LOW (ref 39.0–52.0)
Hemoglobin: 8.4 g/dL — ABNORMAL LOW (ref 13.0–17.0)
MCH: 32.1 pg (ref 26.0–34.0)
MCHC: 32.7 g/dL (ref 30.0–36.0)
MCV: 98.1 fL (ref 80.0–100.0)
Platelets: 122 10*3/uL — ABNORMAL LOW (ref 150–400)
RBC: 2.62 MIL/uL — ABNORMAL LOW (ref 4.22–5.81)
RDW: 17.7 % — ABNORMAL HIGH (ref 11.5–15.5)
WBC: 6.4 10*3/uL (ref 4.0–10.5)
nRBC: 0.3 % — ABNORMAL HIGH (ref 0.0–0.2)

## 2020-06-15 LAB — RENAL FUNCTION PANEL
Albumin: 2.6 g/dL — ABNORMAL LOW (ref 3.5–5.0)
Anion gap: 12 (ref 5–15)
BUN: 43 mg/dL — ABNORMAL HIGH (ref 6–20)
CO2: 27 mmol/L (ref 22–32)
Calcium: 7.7 mg/dL — ABNORMAL LOW (ref 8.9–10.3)
Chloride: 100 mmol/L (ref 98–111)
Creatinine, Ser: 6.81 mg/dL — ABNORMAL HIGH (ref 0.61–1.24)
GFR, Estimated: 9 mL/min — ABNORMAL LOW (ref >60)
Glucose, Bld: 98 mg/dL (ref 70–99)
Phosphorus: 4.6 mg/dL (ref 2.5–4.6)
Potassium: 3.8 mmol/L (ref 3.5–5.1)
Sodium: 139 mmol/L (ref 135–145)

## 2020-06-15 NOTE — ED Notes (Signed)
Pt's clothing and linens changed for stool incontinence. Leg bag changed due to being soiled. Non-slip socks applied.

## 2020-06-15 NOTE — Progress Notes (Signed)
Brief progress note.  Please see discharge summary from Dr. Sabino Gasser on 06/14/2020.  Briefly this is a 50 year old male that was placed in observation status and underwent urgent hemodialysis.  Patient admits several hemodialysis sessions at home.  Underwent hemodialysis emergently on 06/13/2020 then again on 06/14/2020.  We attempted to discharge patient home on 06/14/2020 however weakness was reported discharge was delayed due to lack of a safe disposition plan.  I saw patient this morning.  He is in no visible distress.  With assistance from ED charge RN and case management we were able to secure transport for this patient back home.  He will resume his normal Monday Wednesday Friday hemodialysis schedule  Ralene Muskrat MD  No charge

## 2020-06-15 NOTE — Progress Notes (Signed)
[facility]  ROUNDING NOTE   Subjective:   Hemodialysis treatment yesterday. UF of 1 liter.   Objective:  Vital signs in last 24 hours:  Temp:  [97.7 F (36.5 C)-98.7 F (37.1 C)] 97.7 F (36.5 C) (01/25 0730) Pulse Rate:  [68-77] 77 (01/25 0730) Resp:  [16-27] 18 (01/25 0730) BP: (109-146)/(63-91) 146/91 (01/25 0730) SpO2:  [97 %-100 %] 99 % (01/25 0730)  Weight change:  There were no vitals filed for this visit.  Intake/Output: I/O last 3 completed shifts: In: -  Out: 1480 [Urine:480; Other:1000]   Intake/Output this shift:  No intake/output data recorded.  Physical Exam: General: NAD,   Head: Normocephalic, atraumatic. Moist oral mucosal membranes  Eyes: Anicteric, PERRL  Neck: Supple, trachea midline  Lungs:  Diminished.   Heart: Regular rate and rhythm  Abdomen:  Soft, nontender,   Extremities:  No peripheral edema.  Neurologic: Alert and oriented   Skin: No lesions  Access: Right upper AVF     Basic Metabolic Panel: Recent Labs  Lab 06/10/20 1125 06/13/20 1146 06/14/20 0926 06/15/20 0413  NA 145 145  --  139  K 5.9* 6.5*  --  3.8  CL 110 109  --  100  CO2 16* 15*  --  27  GLUCOSE 96 97  --  98  BUN 103* 120*  --  43*  CREATININE 12.96* 14.55*  --  6.81*  CALCIUM 8.9 9.4  --  7.7*  MG  --  2.5* 2.1  --   PHOS  --   --   --  4.6    Liver Function Tests: Recent Labs  Lab 06/13/20 1146 06/15/20 0413  AST 30  --   ALT 45*  --   ALKPHOS 59  --   BILITOT 1.6*  --   PROT 8.3*  --   ALBUMIN 3.3* 2.6*   Recent Labs  Lab 06/13/20 1146  LIPASE 37   No results for input(s): AMMONIA in the last 168 hours.  CBC: Recent Labs  Lab 06/10/20 1125 06/13/20 1146 06/14/20 0926 06/15/20 0413  WBC 6.6 6.4 7.1 6.4  HGB 9.5* 10.6* 9.3* 8.4*  HCT 29.7* 32.6* 28.1* 25.7*  MCV 97.7 98.5 97.9 98.1  PLT 161 204 134* 122*    Cardiac Enzymes: No results for input(s): CKTOTAL, CKMB, CKMBINDEX, TROPONINI in the last 168  hours.  BNP: Invalid input(s): POCBNP  CBG: No results for input(s): GLUCAP in the last 168 hours.  Microbiology: Results for orders placed or performed during the hospital encounter of 06/13/20  SARS Coronavirus 2 by RT PCR (hospital order, performed in [facility] hospital lab) Nasopharyngeal Nasopharyngeal Swab     Status: None   Collection Time: 06/13/20  1:06 PM   Specimen: Nasopharyngeal Swab  Result Value Ref Range Status   SARS Coronavirus 2 NEGATIVE NEGATIVE Final    Comment: (NOTE) SARS-CoV-2 target nucleic acids are NOT DETECTED.  The SARS-CoV-2 RNA is generally detectable in upper and lower respiratory specimens during the acute phase of infection. The lowest concentration of SARS-CoV-2 viral copies this assay can detect is 250 copies / mL. A negative result does not preclude SARS-CoV-2 infection and should not be used as the sole basis for treatment or other patient management decisions.  A negative result may occur with improper specimen collection / handling, submission of specimen other than nasopharyngeal swab, presence of viral mutation(s) within the areas targeted by this assay, and inadequate number of viral copies (<250 copies / mL).  A negative result must be combined with clinical observations, patient history, and epidemiological information.  Fact Sheet for Patients:   StrictlyIdeas.no  Fact Sheet for Healthcare Providers: BankingDealers.co.za  This test is not yet approved or  cleared by the [place] FDA and has been authorized for detection and/or diagnosis of SARS-CoV-2 by FDA under an Emergency Use Authorization (EUA).  This EUA will remain in effect (meaning this test can be used) for the duration of the COVID-19 declaration under Section 564(b)(1) of the Act, 21 U.S.C. section 360bbb-3(b)(1), unless the authorization is terminated or revoked sooner.  Performed at [facility], [address] [place], [place] [address]     Coagulation Studies: No results for input(s): LABPROT, INR in the last 72 hours.  Urinalysis: No results for input(s): COLORURINE, LABSPEC, PHURINE, GLUCOSEU, HGBUR, BILIRUBINUR, KETONESUR, PROTEINUR, UROBILINOGEN, NITRITE, LEUKOCYTESUR in the last 72 hours.  Invalid input(s): APPERANCEUR    Imaging: No results found.   Medications:   . sodium chloride     . aspirin EC  81 mg Oral Daily  . carvedilol  6.25 mg Oral BID  . docusate sodium  100 mg Oral BID  . heparin  5,000 Units Subcutaneous Q8H  . sevelamer carbonate  0.8 g Oral TID WC  . sodium chloride flush  3 mL Intravenous Q12H  . sodium zirconium cyclosilicate  10 g Oral Once   sodium chloride, acetaminophen **OR** acetaminophen, HYDROcodone-acetaminophen, ondansetron **OR** ondansetron (ZOFRAN) IV, sodium chloride flush  Assessment/ Plan:  [name] is a 50 y.o. black male with ESRD on HD, HTN, stroke, right nephrostomy tube  who presented to the ED for nausea and vomiting found to be hyperkalemic.   [facility] MWF [facility] Left AVF 50.5kg  1. ESRD on HD with hyperkalemia - Continue MWF schedule. Dialysis for tomorrow.  - lokelma  2. Anemia with chronic kidney disease: hemoglobin 8.4 - EPO with HD treatments.   3. Hypertension:  - continue on carvedilol   4. Secondary hyperparathyroidism  - sevelamer with meals.    LOS: 0 Thaxton Pelley 1/25/20228:45 AM

## 2020-06-15 NOTE — ED Notes (Signed)
Advised pt was trying to get him ride home, pt uncooperative in answering questions, yelling profanities in room.   Pt states he can walk, then tells me he can't walk.  In evaluating, pt legs appears to be atrophied.  Charge RN notified, to see if we can arrange transport home via ambulance.

## 2020-06-18 ENCOUNTER — Other Ambulatory Visit: Payer: Self-pay

## 2020-06-18 ENCOUNTER — Emergency Department: Payer: Medicaid Other

## 2020-06-18 ENCOUNTER — Encounter: Payer: Self-pay | Admitting: *Deleted

## 2020-06-18 ENCOUNTER — Inpatient Hospital Stay
Admission: EM | Admit: 2020-06-18 | Discharge: 2020-06-20 | DRG: 640 | Disposition: A | Discharge status: Home / Self Care | Payer: Medicaid Other | Attending: Internal Medicine | Admitting: Internal Medicine

## 2020-06-18 DIAGNOSIS — R531 Weakness: Secondary | ICD-10-CM | POA: Diagnosis present

## 2020-06-18 DIAGNOSIS — Z7982 Long term (current) use of aspirin: Secondary | ICD-10-CM | POA: Diagnosis not present

## 2020-06-18 DIAGNOSIS — Z9115 Patient's noncompliance with renal dialysis: Secondary | ICD-10-CM | POA: Diagnosis not present

## 2020-06-18 DIAGNOSIS — N186 End stage renal disease: Secondary | ICD-10-CM | POA: Diagnosis not present

## 2020-06-18 DIAGNOSIS — M21379 Foot drop, unspecified foot: Secondary | ICD-10-CM | POA: Diagnosis present

## 2020-06-18 DIAGNOSIS — I639 Cerebral infarction, unspecified: Secondary | ICD-10-CM | POA: Diagnosis present

## 2020-06-18 DIAGNOSIS — Z992 Dependence on renal dialysis: Secondary | ICD-10-CM

## 2020-06-18 DIAGNOSIS — R9431 Abnormal electrocardiogram [ECG] [EKG]: Secondary | ICD-10-CM

## 2020-06-18 DIAGNOSIS — D696 Thrombocytopenia, unspecified: Secondary | ICD-10-CM | POA: Diagnosis present

## 2020-06-18 DIAGNOSIS — R7989 Other specified abnormal findings of blood chemistry: Secondary | ICD-10-CM | POA: Diagnosis present

## 2020-06-18 DIAGNOSIS — M898X9 Other specified disorders of bone, unspecified site: Secondary | ICD-10-CM | POA: Diagnosis present

## 2020-06-18 DIAGNOSIS — D631 Anemia in chronic kidney disease: Secondary | ICD-10-CM | POA: Diagnosis present

## 2020-06-18 DIAGNOSIS — U071 COVID-19: Secondary | ICD-10-CM | POA: Diagnosis present

## 2020-06-18 DIAGNOSIS — Z8673 Personal history of transient ischemic attack (TIA), and cerebral infarction without residual deficits: Secondary | ICD-10-CM | POA: Diagnosis not present

## 2020-06-18 DIAGNOSIS — Z87891 Personal history of nicotine dependence: Secondary | ICD-10-CM | POA: Diagnosis not present

## 2020-06-18 DIAGNOSIS — Z79899 Other long term (current) drug therapy: Secondary | ICD-10-CM | POA: Diagnosis not present

## 2020-06-18 DIAGNOSIS — N2581 Secondary hyperparathyroidism of renal origin: Secondary | ICD-10-CM | POA: Diagnosis present

## 2020-06-18 DIAGNOSIS — I120 Hypertensive chronic kidney disease with stage 5 chronic kidney disease or end stage renal disease: Secondary | ICD-10-CM | POA: Diagnosis present

## 2020-06-18 DIAGNOSIS — I10 Essential (primary) hypertension: Secondary | ICD-10-CM | POA: Diagnosis present

## 2020-06-18 DIAGNOSIS — E875 Hyperkalemia: Secondary | ICD-10-CM | POA: Diagnosis present

## 2020-06-18 DIAGNOSIS — R778 Other specified abnormalities of plasma proteins: Secondary | ICD-10-CM | POA: Diagnosis present

## 2020-06-18 LAB — LACTATE DEHYDROGENASE: LDH: 278 U/L — ABNORMAL HIGH (ref 98–192)

## 2020-06-18 LAB — BASIC METABOLIC PANEL
Anion gap: 14 (ref 5–15)
BUN: 69 mg/dL — ABNORMAL HIGH (ref 6–20)
CO2: 23 mmol/L (ref 22–32)
Calcium: 8.9 mg/dL (ref 8.9–10.3)
Chloride: 104 mmol/L (ref 98–111)
Creatinine, Ser: 10.6 mg/dL — ABNORMAL HIGH (ref 0.61–1.24)
GFR, Estimated: 5 mL/min — ABNORMAL LOW (ref >60)
Glucose, Bld: 81 mg/dL (ref 70–99)
Potassium: 6.8 mmol/L (ref 3.5–5.1)
Sodium: 141 mmol/L (ref 135–145)

## 2020-06-18 LAB — CBC
HCT: 33.7 % — ABNORMAL LOW (ref 39.0–52.0)
Hemoglobin: 10.5 g/dL — ABNORMAL LOW (ref 13.0–17.0)
MCH: 31.9 pg (ref 26.0–34.0)
MCHC: 31.2 g/dL (ref 30.0–36.0)
MCV: 102.4 fL — ABNORMAL HIGH (ref 80.0–100.0)
Platelets: 112 10*3/uL — ABNORMAL LOW (ref 150–400)
RBC: 3.29 MIL/uL — ABNORMAL LOW (ref 4.22–5.81)
RDW: 18.2 % — ABNORMAL HIGH (ref 11.5–15.5)
WBC: 4.3 10*3/uL (ref 4.0–10.5)
nRBC: 0 % (ref 0.0–0.2)

## 2020-06-18 LAB — TROPONIN I (HIGH SENSITIVITY)
Troponin I (High Sensitivity): 57 ng/L — ABNORMAL HIGH (ref <18)
Troponin I (High Sensitivity): 53 ng/L — ABNORMAL HIGH (ref <18)
Troponin I (High Sensitivity): 51 ng/L — ABNORMAL HIGH (ref <18)
Troponin I (High Sensitivity): 56 ng/L — ABNORMAL HIGH (ref <18)

## 2020-06-18 LAB — SAVE SMEAR(SSMR), FOR PROVIDER SLIDE REVIEW

## 2020-06-18 LAB — SARS CORONAVIRUS 2 BY RT PCR (HOSPITAL ORDER, PERFORMED IN ~~LOC~~ HOSPITAL LAB): SARS Coronavirus 2: POSITIVE — AB

## 2020-06-18 MED ORDER — EPOETIN ALFA 10000 UNIT/ML IJ SOLN
10000.0000 [IU] | INTRAMUSCULAR | Status: DC
  Filled 2020-06-18: qty 1

## 2020-06-18 MED ORDER — HEPARIN SODIUM (PORCINE) 5000 UNIT/ML IJ SOLN
5000.0000 [IU] | Freq: Three times a day (TID) | INTRAMUSCULAR | Status: DC
  Filled 2020-06-18 (×2): qty 1

## 2020-06-18 MED ORDER — DEXTROSE 50 % IV SOLN: 50.0000 mL | Freq: Once | INTRAVENOUS | Status: DC

## 2020-06-18 MED ORDER — CARVEDILOL 6.25 MG PO TABS
6.2500 mg | ORAL_TABLET | Freq: Two times a day (BID) | ORAL | Status: DC
  Administered 2020-06-18 – 2020-06-20 (×2): 6.25 mg via ORAL
  Filled 2020-06-18 (×2): qty 1

## 2020-06-18 MED ORDER — ACETAMINOPHEN 325 MG PO TABS: 650.0000 mg | ORAL_TABLET | Freq: Four times a day (QID) | ORAL | Status: DC | PRN

## 2020-06-18 MED ORDER — ASPIRIN EC 81 MG PO TBEC
81.0000 mg | DELAYED_RELEASE_TABLET | Freq: Every day | ORAL | Status: DC
  Administered 2020-06-18 – 2020-06-20 (×2): 81 mg via ORAL
  Filled 2020-06-18 (×2): qty 1

## 2020-06-18 MED ORDER — ONDANSETRON HCL 4 MG PO TABS: 4.0000 mg | ORAL_TABLET | Freq: Four times a day (QID) | ORAL | Status: DC | PRN

## 2020-06-18 MED ORDER — ZINC SULFATE 220 (50 ZN) MG PO CAPS
220.0000 mg | ORAL_CAPSULE | Freq: Every day | ORAL | Status: DC
  Administered 2020-06-18 – 2020-06-20 (×2): 220 mg via ORAL
  Filled 2020-06-18 (×2): qty 1

## 2020-06-18 MED ORDER — ALBUTEROL SULFATE HFA 108 (90 BASE) MCG/ACT IN AERS
2.0000 | INHALATION_SPRAY | RESPIRATORY_TRACT | Status: DC | PRN
  Filled 2020-06-18: qty 6.7

## 2020-06-18 MED ORDER — DM-GUAIFENESIN ER 30-600 MG PO TB12: 1.0000 | ORAL_TABLET | Freq: Two times a day (BID) | ORAL | Status: DC | PRN

## 2020-06-18 MED ORDER — ASCORBIC ACID 500 MG PO TABS
500.0000 mg | ORAL_TABLET | Freq: Every day | ORAL | Status: DC
  Administered 2020-06-18 – 2020-06-20 (×2): 500 mg via ORAL
  Filled 2020-06-18 (×2): qty 1

## 2020-06-18 MED ORDER — CALCIUM GLUCONATE-NACL 1-0.675 GM/50ML-% IV SOLN
1.0000 g | Freq: Once | INTRAVENOUS | Status: AC
  Administered 2020-06-18: 1000 mg via INTRAVENOUS
  Filled 2020-06-18: qty 50

## 2020-06-18 MED ORDER — INSULIN ASPART 100 UNIT/ML IV SOLN
5.0000 [IU] | Freq: Once | INTRAVENOUS | Status: DC
  Filled 2020-06-18: qty 0.05

## 2020-06-18 MED ORDER — SODIUM ZIRCONIUM CYCLOSILICATE 10 G PO PACK
10.0000 g | PACK | Freq: Once | ORAL | Status: AC
  Administered 2020-06-18: 10 g via ORAL
  Filled 2020-06-18: qty 1

## 2020-06-18 MED ORDER — HYDRALAZINE HCL 20 MG/ML IJ SOLN: 5.0000 mg | INTRAMUSCULAR | Status: DC | PRN

## 2020-06-18 NOTE — ED Provider Notes (Signed)
[facility] Emergency Department Provider Note  ____________________________________________   Event Date/Time   First MD Initiated Contact with Patient 06/18/20 [phone]     (approximate)  I have reviewed the triage vital signs and the nursing notes.   HISTORY  Chief Complaint Weakness    HPI Larry Morrison is a 50 y.o. male with history of hypertension, end-stage renal disease on hemodialysis, right-sided nephrostomy who presents to the emergency department with "not feeling well".  He describes generalized weakness for the past 1 to 2 days.  He denies fevers, chest pain, shortness of breath, cough, nausea, vomiting, diarrhea, abdominal pain.  Reports his nephrostomy tube has been draining appropriately.  States he is scheduled for dialysis in the morning but reports he was feeling so poorly he had to come back to the emergency department.    Was just admitted to the hospital on 06/13/2020 for hyperkalemia.  Was discharged from the hospital 06/14/2020 and received dialysis prior to discharge.  He states he did receive dialysis yesterday on the 27th as an outpatient.  Has a right upper extremity AV fistula.    Past Medical History:  Diagnosis Date  . Anemia   . 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 Dialysis patient ([place])   . Dyspnea   . Foot drop   . History of kidney stones   . History of nephrostomy   . Hypertension   . Renal disorder   . Stroke [facility])     Patient Active Problem List   Diagnosis Date Noted  . Hyperkalemia 06/13/2020  . Anemia 09/17/2019  . Essential hypertension 07/15/2019  . Testicular torsion   . Pyuria 06/10/2016  . Chronic pain following surgery or procedure 12/01/2015  . History of nephrostomy 12/01/2015  . Tobacco use disorder 12/01/2015  . ESRD (end stage renal disease) ([place]) 04/28/2014  . Obstructed nephrostomy tube ([place]) 10/23/2013  . 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 Urinary calculus 02/09/1998  . 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 AV FISTULA PLACEMENT Right 08/07/2019   Procedure: ARTERIOVENOUS (AV) FISTULA CREATION;  Surgeon: Algernon Huxley, MD;  Location: [facility] ORS;  Service: Vascular;  Laterality: Right;  . AV FISTULA PLACEMENT Right 11/20/2019   Procedure: INSERTION OF ARTERIOVENOUS (AV) GORE-TEX GRAFT ARM;  Surgeon: Algernon Huxley, MD;  Location: [facility] ORS;  Service: Vascular;  Laterality: Right;  . NEPHRECTOMY Left   . NEPHRECTOMY    . ORCHIECTOMY Right 10/27/2016   Procedure: PSB ORCHIECTOMY;  Surgeon: Hollice Espy, MD;  Location: [facility] ORS;  Service: Urology;  Laterality: Right;  . ORCHIOPEXY Bilateral 10/27/2016   Procedure: ORCHIOPEXY ADULT;  Surgeon: Hollice Espy, MD;  Location: [facility] ORS;  Service: Urology;  Laterality: Bilateral;  . SCROTAL EXPLORATION Bilateral 10/27/2016   Procedure: SCROTUM EXPLORATION;  Surgeon: Hollice Espy, MD;  Location: [facility] ORS;  Service: Urology;  Laterality: Bilateral;    Prior to Admission medications   Medication Sig Start Date End Date Taking? Authorizing Provider  aspirin EC 81 MG tablet Take 1 tablet (81 mg total) by mouth daily. Patient not taking: Reported on 06/13/2020 11/20/19   Algernon Huxley, MD  lidocaine-prilocaine (EMLA) cream Apply 1 application topically 5 (five) times daily. 03/15/20   [provider]  sevelamer carbonate (RENVELA) 0.8 g PACK packet Take by mouth.  Patient not taking: Reported on 06/13/2020 10/31/19  10/30/20  [provider]    Allergies Vancomycin  Family History  Family history unknown: Yes    Social History Social History   Tobacco Use  . Smoking status: Former Smoker    Packs/day: 0.50    Types: Cigarettes  . Smokeless tobacco: Never Used  Vaping Use  . Vaping Use: Never used  Substance Use Topics  . Alcohol use: No  . Drug use: Yes    Types: Marijuana     Comment: today    Review of Systems Constitutional: No fever. Eyes: No visual changes. ENT: No sore throat. Cardiovascular: Denies chest pain. Respiratory: Denies shortness of breath. Gastrointestinal: No nausea, vomiting, diarrhea. Genitourinary: Negative for dysuria. Musculoskeletal: Negative for back pain. Skin: Negative for rash. Neurological: Negative for focal weakness or numbness.  ____________________________________________   PHYSICAL EXAM:  VITAL SIGNS: ED Triage Vitals  Enc Vitals Group     BP 06/18/20 0227 127/65     Pulse Rate 06/18/20 0214 (P) 88     Resp 06/18/20 0214 (P) 20     Temp 06/18/20 0214 (P) 99 F (37.2 C)     Temp Source 06/18/20 0214 (P) Oral     SpO2 06/18/20 0214 (P) 98 %     Weight 06/18/20 0212 120 lb (54.4 kg)     Height 06/18/20 0212 '5\' 4"'$  (1.626 m)     Head Circumference --      Peak Flow --      Pain Score 06/18/20 0212 0     Pain Loc --      Pain Edu? --      Excl. in [place]? --    CONSTITUTIONAL: Alert and oriented and responds appropriately to questions. Well-appearing; well-nourished HEAD: Normocephalic EYES: Conjunctivae clear, pupils appear equal, EOM appear intact ENT: normal nose; moist mucous membranes NECK: Supple, normal ROM CARD: RRR; S1 and S2 appreciated; no murmurs, no clicks, no rubs, no gallops RESP: Normal chest excursion without splinting or tachypnea; breath sounds clear and equal bilaterally; no wheezes, no rhonchi, no rales, no hypoxia or respiratory distress, speaking full sentences ABD/GI: Normal bowel sounds; non-distended; soft, non-tender, no rebound, no guarding, no peritoneal signs, no hepatosplenomegaly BACK: The back appears normal EXT: Normal ROM in all joints; no deformity noted, no edema; no cyanosis SKIN: Normal color for age and race; warm; no rash on exposed skin NEURO: Moves all extremities equally PSYCH: The patient's mood and manner are  appropriate.  ____________________________________________   LABS (all labs ordered are listed, but only abnormal results are displayed)  Labs Reviewed  BASIC METABOLIC PANEL - Abnormal; Notable for the following components:      Result Value   Potassium 6.8 (*)    BUN 69 (*)    Creatinine, Ser 10.60 (*)    GFR, Estimated 5 (*)    All other components within normal limits  CBC - Abnormal; Notable for the following components:   RBC 3.29 (*)    Hemoglobin 10.5 (*)    HCT 33.7 (*)    MCV 102.4 (*)    RDW 18.2 (*)    Platelets 112 (*)    All other components within normal limits  TROPONIN I (HIGH SENSITIVITY) - Abnormal; Notable for the following components:   Troponin I (High Sensitivity) 57 (*)    All other components within normal limits  URINE CULTURE  SARS CORONAVIRUS 2 BY RT PCR (HOSPITAL ORDER, [facility])  URINALYSIS, ROUTINE W REFLEX MICROSCOPIC  TROPONIN I (  HIGH SENSITIVITY)   ____________________________________________  EKG   EKG Interpretation  Date/Time:  Friday June 18 2020 02:33:11 EST Ventricular Rate:  87 PR Interval:  150 QRS Duration: 82 QT Interval:  414 QTC Calculation: 498 R Axis:   88 Text Interpretation: Normal sinus rhythm Prolonged QT Abnormal ECG No significant change since last tracing Confirmed by Pryor Curia [phone]) on 06/18/2020 5:07:25 AM       ____________________________________________  RADIOLOGY Larry Morrison, personally viewed and evaluated these images (plain radiographs) as part of my medical decision making, as well as reviewing the written report by the radiologist.  ED MD interpretation: Small left pleural effusion.  Official radiology report(s): DG Chest 2 View  Result Date: 06/18/2020 CLINICAL DATA:  Weakness EXAM: CHEST - 2 VIEW COMPARISON:  03/28/2020 FINDINGS: Mild cardiomegaly. No focal airspace consolidation or pulmonary edema. Small left pleural effusion. Right subclavian stent.  IMPRESSION: Small left pleural effusion. Electronically Signed   By: Ulyses Jarred M.D.   On: 06/18/2020 02:52    ____________________________________________   PROCEDURES  Procedure(s) performed (including Critical Care):  Procedures  CRITICAL CARE Performed by: Cyril Mourning Anmol Fleck   Total critical care time: 45 minutes  Critical care time was exclusive of separately billable procedures and treating other patients.  Critical care was necessary to treat or prevent imminent or life-threatening deterioration.  Critical care was time spent personally by me on the following activities: development of treatment plan with patient and/or surrogate as well as nursing, discussions with consultants, evaluation of patient's response to treatment, examination of patient, obtaining history from patient or surrogate, ordering and performing treatments and interventions, ordering and review of laboratory studies, ordering and review of radiographic studies, pulse oximetry and re-evaluation of patient's condition.  ____________________________________________   INITIAL IMPRESSION / ASSESSMENT AND PLAN / ED COURSE  As part of my medical decision making, I reviewed the following data within the [facility] notes reviewed and incorporated, Labs reviewed, EKG interpreted NSR, Old EKG reviewed, Old chart reviewed and Notes from prior ED visits, nephrology and medicine consulted.   Patient here with history of medical noncompliance on hemodialysis for end-stage renal disease who states he has been feeling weak over the past 2 days.  He tells me that he had dialysis yesterday however his potassium level is 6.8 at this time and he has a prolonged QT interval.  No peaked T waves.  No prolonged QRS.  Otherwise labs unremarkable.  Normal blood pressure.  He has a small left pleural effusion on chest x-ray but otherwise no signs of volume overload.  He is not hypoxic.  Will discuss with  nephrology further recommendations.  Will give Lokelma, calcium.    5:30 AM Discussed with Dr. Juleen China with nephrology.  He agrees with Lokelma, calcium.  Recommends medicine admission.  They will try to get patient in for dialysis this morning.  6:13 AM Discussed patient's case with hospitalist, Dr. Sidney Ace.  I have recommended admission and patient (and family if present) agree with this plan. Admitting physician will place admission orders.   I reviewed all nursing notes, vitals, pertinent previous records and reviewed/interpreted all EKGs, lab and urine results, imaging (as available).    ____________________________________________   FINAL CLINICAL IMPRESSION(S) / ED DIAGNOSES  Final diagnoses:  Hyperkalemia  Generalized weakness     ED Discharge Orders    None      *Please note:  Larry Morrison was evaluated in Emergency Department on 06/18/2020 for the symptoms described in  the history of present illness. He was evaluated in the context of the global COVID-19 pandemic, which necessitated consideration that the patient might be at risk for infection with the SARS-CoV-2 virus that causes COVID-19. Institutional protocols and algorithms that pertain to the evaluation of patients at risk for COVID-19 are in a state of rapid change based on information released by regulatory bodies including the CDC and federal and state organizations. These policies and algorithms were followed during the patient's care in the ED.  Some ED evaluations and interventions may be delayed as a result of limited staffing during and the pandemic.*   Note:  This document was prepared using Dragon voice recognition software and may include unintentional dictation errors.   Domanique Luckett, Delice Bison, DO 06/18/20 [phone]

## 2020-06-18 NOTE — ED Triage Notes (Signed)
EMS brings pt in from home; dialysis 2 days ago; d/c 2 days ago for kidney infection "and still feels bad"

## 2020-06-18 NOTE — ED Notes (Signed)
Pt's IV was complete and beeping. I turned channel off.

## 2020-06-18 NOTE — ED Notes (Addendum)
Apple juice and water provided to pt per request. Offered to reposition pt in bed, pt refused.

## 2020-06-18 NOTE — ED Notes (Addendum)
Patient refused CBG check. Patient is requesting something to eat. Dr. Blaine Hamper informed via secure chat.

## 2020-06-18 NOTE — Progress Notes (Signed)
Hemodialysis patient known at [name] MWF 6:15am. Due to being COVID+ patient will need to treat at [facility] 12:45. Patient normally rides with [facility], however was recently discharged from their service. Transportation to [facility] may be a barrier. [facility] is aware and working on solution. Please contact me with any dialysis placement concerns.   Elvera Bicker Dialysis Coordinator [phone]

## 2020-06-18 NOTE — ED Notes (Signed)
Dialysis RN in the room.

## 2020-06-18 NOTE — ED Triage Notes (Signed)
Pt brought in via ems from home.  Pt states I just feel bad.  Dialysis 2 days ago.  No chest pain or sob.  No n/v/d.  Pt alert  Speech clear.

## 2020-06-18 NOTE — ED Notes (Signed)
Dialysis nurse at bedside

## 2020-06-18 NOTE — ED Notes (Signed)
Pt given meal tray and drink at this time, ok per MD Ward

## 2020-06-18 NOTE — ED Notes (Signed)
Patient given dinner tray.

## 2020-06-18 NOTE — Progress Notes (Signed)
[facility]  ROUNDING NOTE   Subjective:   [name] was admitted to [facility] on 06/18/2020 for Hyperkalemia [E87.5]  Last hemodialysis treatment was on 1/25 in the Emergency Department. Patient was discharged and asked to follow up with regular outpatient dialysis schedule. However he missed his treatment on 1/26 due to transportation.   Patient was found to have COVID-19+ and hyperkalemia with EKG changes. Emergent hemodialysis treatment this morning.    Objective:  Vital signs in last 24 hours:  Temp:  [98.1 F (36.7 C)-99 F (37.2 C)] 98.3 F (36.8 C) (01/28 0930) Pulse Rate:  [71-90] 73 (01/28 1200) Resp:  [16-27] 23 (01/28 1200) BP: (127-157)/(50-92) 142/73 (01/28 1200) SpO2:  [95 %-100 %] 100 % (01/28 1200) Weight:  [54.4 kg] 54.4 kg (01/28 0212)  Weight change:  Filed Weights   06/18/20 0212  Weight: 54.4 kg    Intake/Output: I/O last 3 completed shifts: In: 48 [IV Piggyback:50] Out: -    Intake/Output this shift:  No intake/output data recorded.  Physical Exam: General: NAD, laying in bed  Head: Normocephalic, atraumatic. Moist oral mucosal membranes  Eyes: Anicteric, PERRL  Neck: Supple, trachea midline  Lungs:  Diminished.   Heart: Regular rate and rhythm  Abdomen:  Soft, nontender,   Extremities:  No peripheral edema.  Neurologic: Alert and oriented   Skin: No lesions  Access: Right upper AVF     Basic Metabolic Panel: Recent Labs  Lab 06/13/20 1146 06/14/20 0926 06/15/20 0413 06/18/20 0251  NA 145  --  139 141  K 6.5*  --  3.8 6.8*  CL 109  --  100 104  CO2 15*  --  27 23  GLUCOSE 97  --  98 81  BUN 120*  --  43* 69*  CREATININE 14.55*  --  6.81* 10.60*  CALCIUM 9.4  --  7.7* 8.9  MG 2.5* 2.1  --   --   PHOS  --   --  4.6  --     Liver Function Tests: Recent Labs  Lab 06/13/20 1146 06/15/20 0413  AST 30  --   ALT 45*  --   ALKPHOS 59  --   BILITOT 1.6*  --   PROT 8.3*  --   ALBUMIN 3.3* 2.6*   Recent Labs   Lab 06/13/20 1146  LIPASE 37   No results for input(s): AMMONIA in the last 168 hours.  CBC: Recent Labs  Lab 06/13/20 1146 06/14/20 0926 06/15/20 0413 06/18/20 0251  WBC 6.4 7.1 6.4 4.3  HGB 10.6* 9.3* 8.4* 10.5*  HCT 32.6* 28.1* 25.7* 33.7*  MCV 98.5 97.9 98.1 102.4*  PLT 204 134* 122* 112*    Cardiac Enzymes: No results for input(s): CKTOTAL, CKMB, CKMBINDEX, TROPONINI in the last 168 hours.  BNP: Invalid input(s): POCBNP  CBG: No results for input(s): GLUCAP in the last 168 hours.  Microbiology: Results for orders placed or performed during the hospital encounter of 06/18/20  SARS Coronavirus 2 by RT PCR (hospital order, performed in [facility] hospital lab) Nasopharyngeal Nasopharyngeal Swab     Status: Abnormal   Collection Time: 06/18/20  5:45 AM   Specimen: Nasopharyngeal Swab  Result Value Ref Range Status   SARS Coronavirus 2 POSITIVE (A) NEGATIVE Final    Comment: RESULT CALLED TO, READ BACK BY AND VERIFIED WITH:  REINA GALJOUR AT [id number] 06/18/20 SDR (NOTE) SARS-CoV-2 target nucleic acids are DETECTED  SARS-CoV-2 RNA is generally detectable in upper respiratory specimens  during the acute phase of infection.  Positive results are indicative  of the presence of the identified virus, but do not rule out bacterial infection or co-infection with other pathogens not detected by the test.  Clinical correlation with patient history and  other diagnostic information is necessary to determine patient infection status.  The expected result is negative.  Fact Sheet for Patients:   StrictlyIdeas.no   Fact Sheet for Healthcare Providers:   BankingDealers.co.za    This test is not yet approved or cleared by the [place] FDA and  has been authorized for detection and/or diagnosis of SARS-CoV-2 by FDA under an Emergency Use Authorization (EUA).  This EUA will remain in effect (meaning this te st can be used) for  the duration of  the COVID-19 declaration under Section 564(b)(1) of the Act, 21 U.S.C. section 360-bbb-3(b)(1), unless the authorization is terminated or revoked sooner.  Performed at [facility], [address] [place], [place] [address]     Coagulation Studies: No results for input(s): LABPROT, INR in the last 72 hours.  Urinalysis: No results for input(s): COLORURINE, LABSPEC, PHURINE, GLUCOSEU, HGBUR, BILIRUBINUR, KETONESUR, PROTEINUR, UROBILINOGEN, NITRITE, LEUKOCYTESUR in the last 72 hours.  Invalid input(s): APPERANCEUR    Imaging: DG Chest 2 View  Result Date: 06/18/2020 CLINICAL DATA:  Weakness EXAM: CHEST - 2 VIEW COMPARISON:  03/28/2020 FINDINGS: Mild cardiomegaly. No focal airspace consolidation or pulmonary edema. Small left pleural effusion. Right subclavian stent. IMPRESSION: Small left pleural effusion. Electronically Signed   By: Ulyses Jarred M.D.   On: 06/18/2020 02:52     Medications:    . vitamin C  500 mg Oral Daily  . dextrose  50 mL Intravenous Once  . zinc sulfate  220 mg Oral Daily   acetaminophen, albuterol, dextromethorphan-guaiFENesin, hydrALAZINE, ondansetron  Assessment/ Plan:  [name] is a 50 y.o. black male with ESRD on HD, HTN, stroke, right nephrostomy tube  who presents to [facility] on 06/18/2020 for Hyperkalemia [E87.5]   [facility] MWF [facility] Left AVF 50.5kg  1. ESRD on HD with hyperkalemia - Emergent hemodialysis treatment today. Change to TTS schedule due to COVID diagnosis.  - Next dialysis for tomorrow.  - lokelma  2. Anemia with chronic kidney disease: hemoglobin 8.4 - EPO with HD treatments.   3. Hypertension: well controlled blood pressure on dialysis treatment.  - continue on carvedilol   4. Secondary hyperparathyroidism  - sevelamer with meals.    LOS: 0 Larry Morrison 1/28/202212:24 PM

## 2020-06-18 NOTE — ED Notes (Signed)
Spoke with Yetta Flock, RN in dialysis. They were prepared to take pt for dialysis but since covid positive, will have to come to pt. She will call back.

## 2020-06-18 NOTE — H&P (Signed)
History and Physical    Larry Morrison [id number] DOB: [birth year] DOA: 06/18/2020  Referring MD/NP/PA:   PCP: [facility]   Patient coming from:  The patient is coming from home.  At baseline, pt is independent for most of ADL.        Chief Complaint: Generalized weakness and fatigue  HPI: Larry Morrison is a 50 y.o. male with medical history significant of hypertension, stroke, ESRD-HD, brain aneurysm, anemia, right nephrostomy, kidney stone, former smoker, who presents with generalized weakness and fatigue.   Pt was recently hospitalized from 1/23-1/24 due to hyperkalemia. He received dialysis prior to discharge. Pt states that he has been feeling bad in the past 2 days. He has generalized weakness and fatigue.  No unilateral numbness or tingling in extremities.  No facial droop or slurred speech. He denies fevers, chills, chest pain, shortness of breath, cough, nausea, vomiting, diarrhea, abdominal pain.  No symptoms of UTI. Pt reports his nephrostomy tube has been draining appropriately. He is scheduled for dialysis in the morning but reports he was feeling so poorly he had to come back to the emergency department.   ED Course: pt was found to have positive COVID-19 PCR, pending urinalysis, WBC 4.3, troponin level 57, 53, calcium 8.9, potassium 6.8, bicarbonate of 23, creatinine 10.6, BUN 69, temperature normal, blood pressure 154/85, heart rate 85, RR 18, oxygen saturation 97% on room air.  Chest x-ray is negative for infiltration, but showed small pleural effusion.  Patient is admitted to progressive bed as inpatient.  Dr. Juleen China of nephrology is consulted for dialysis.  Review of Systems:   General: no fevers, chills, no body weight gain, has poor appetite, has fatigue HEENT: no blurry vision, hearing changes or sore throat Respiratory: no dyspnea, coughing, wheezing CV: no chest pain, no palpitations GI: no nausea, vomiting, abdominal pain, diarrhea, constipation GU: no  dysuria, burning on urination, increased urinary frequency, hematuria  Ext: no leg edema Neuro: no unilateral weakness, numbness, or tingling, no vision change or hearing loss Skin: no rash, no skin tear. MSK: No muscle spasm, no deformity, no limitation of range of movement in spin Heme: No easy bruising.  Travel history: No recent long distant travel.  Allergy:  Allergies  Allergen Reactions  . Vancomycin     Patient denies    Past Medical History:  Diagnosis Date  . Anemia   . Aneurysm ([place])    brain at age 49  . Dialysis patient ([place])   . Dyspnea   . Foot drop   . History of kidney stones   . History of nephrostomy   . Hypertension   . Renal disorder   . Stroke [facility])     Past Surgical History:  Procedure Laterality Date  . AV FISTULA PLACEMENT Right 08/07/2019   Procedure: ARTERIOVENOUS (AV) FISTULA CREATION;  Surgeon: Algernon Huxley, MD;  Location: [facility] ORS;  Service: Vascular;  Laterality: Right;  . AV FISTULA PLACEMENT Right 11/20/2019   Procedure: INSERTION OF ARTERIOVENOUS (AV) GORE-TEX GRAFT ARM;  Surgeon: Algernon Huxley, MD;  Location: [facility] ORS;  Service: Vascular;  Laterality: Right;  . NEPHRECTOMY Left   . NEPHRECTOMY    . ORCHIECTOMY Right 10/27/2016   Procedure: PSB ORCHIECTOMY;  Surgeon: Hollice Espy, MD;  Location: [facility] ORS;  Service: Urology;  Laterality: Right;  . ORCHIOPEXY Bilateral 10/27/2016   Procedure: ORCHIOPEXY ADULT;  Surgeon: Hollice Espy, MD;  Location: [facility] ORS;  Service: Urology;  Laterality: Bilateral;  . SCROTAL EXPLORATION Bilateral 10/27/2016  Procedure: SCROTUM EXPLORATION;  Surgeon: Hollice Espy, MD;  Location: [facility] ORS;  Service: Urology;  Laterality: Bilateral;    Social History:  reports that he has quit smoking. His smoking use included cigarettes. He smoked 0.50 packs per day. He has never used smokeless tobacco. He reports current drug use. Drug: Marijuana. He reports that he does not drink alcohol.  Family History:  Family  History  Family history unknown: Yes     Prior to Admission medications   Medication Sig Start Date End Date Taking? Authorizing Provider  aspirin EC 81 MG tablet Take 1 tablet (81 mg total) by mouth daily. Patient not taking: Reported on 06/13/2020 11/20/19   Algernon Huxley, MD  lidocaine-prilocaine (EMLA) cream Apply 1 application topically 5 (five) times daily. 03/15/20   [provider]  sevelamer carbonate (RENVELA) 0.8 g PACK packet Take by mouth.  Patient not taking: Reported on 06/13/2020 10/31/19 10/30/20  [provider]    Physical Exam: Vitals:   06/18/20 0214 06/18/20 0227 06/18/20 0551 06/18/20 0700  BP:  127/65 (!) 157/88 (!) 154/85  Pulse: (P) 88 71 83 85  Resp: (P) '20 18 18   '$ Temp: (P) 99 F (37.2 C) 98.1 F (36.7 C)    TempSrc: (P) Oral Oral    SpO2: (P) 98% 95% 99% 97%  Weight:      Height:       General: Not in acute distress HEENT:       Eyes: PERRL, EOMI, no scleral icterus.       ENT: No discharge from the ears and nose, no pharynx injection, no tonsillar enlargement.        Neck: No JVD, no bruit, no mass felt. Heme: No neck lymph node enlargement. Cardiac: S1/S2, RRR, No murmurs, No gallops or rubs. Respiratory: No rales, wheezing, rhonchi or rubs. GI: Soft, nondistended, nontender, no rebound pain, no organomegaly, BS present. GU: No hematuria Ext: No pitting leg edema bilaterally. 1+DP/PT pulse bilaterally. Musculoskeletal: No joint deformities, No joint redness or warmth, no limitation of ROM in spin. Skin: No rashes.  Neuro: Alert, oriented X3, cranial nerves II-XII grossly intact, moves all extremities. Psych: Patient is not psychotic, no suicidal or hemocidal ideation.  Labs on Admission: I have personally reviewed following labs and imaging studies  CBC: Recent Labs  Lab 06/13/20 1146 06/14/20 0926 06/15/20 0413 06/18/20 0251  WBC 6.4 7.1 6.4 4.3  HGB 10.6* 9.3* 8.4* 10.5*  HCT 32.6* 28.1* 25.7* 33.7*  MCV 98.5 97.9 98.1  102.4*  PLT 204 134* 122* [id number]*   Basic Metabolic Panel: Recent Labs  Lab 06/13/20 1146 06/14/20 0926 06/15/20 0413 06/18/20 0251  NA 145  --  139 141  K 6.5*  --  3.8 6.8*  CL 109  --  100 104  CO2 15*  --  27 23  GLUCOSE 97  --  98 81  BUN 120*  --  43* 69*  CREATININE 14.55*  --  6.81* 10.60*  CALCIUM 9.4  --  7.7* 8.9  MG 2.5* 2.1  --   --   PHOS  --   --  4.6  --    GFR: Estimated Creatinine Clearance: 6.3 mL/min (A) (by C-G formula based on SCr of 10.6 mg/dL (H)). Liver Function Tests: Recent Labs  Lab 06/13/20 1146 06/15/20 0413  AST 30  --   ALT 45*  --   ALKPHOS 59  --   BILITOT 1.6*  --   PROT 8.3*  --  ALBUMIN 3.3* 2.6*   Recent Labs  Lab 06/13/20 1146  LIPASE 37   No results for input(s): AMMONIA in the last 168 hours. Coagulation Profile: No results for input(s): INR, PROTIME in the last 168 hours. Cardiac Enzymes: No results for input(s): CKTOTAL, CKMB, CKMBINDEX, TROPONINI in the last 168 hours. BNP (last 3 results) No results for input(s): PROBNP in the last 8760 hours. HbA1C: No results for input(s): HGBA1C in the last 72 hours. CBG: No results for input(s): GLUCAP in the last 168 hours. Lipid Profile: No results for input(s): CHOL, HDL, LDLCALC, TRIG, CHOLHDL, LDLDIRECT in the last 72 hours. Thyroid Function Tests: No results for input(s): TSH, T4TOTAL, FREET4, T3FREE, THYROIDAB in the last 72 hours. Anemia Panel: No results for input(s): VITAMINB12, FOLATE, FERRITIN, TIBC, IRON, RETICCTPCT in the last 72 hours. Urine analysis:    Component Value Date/Time   COLORURINE AMBER (A) 04/28/2017 1032   APPEARANCEUR TURBID (A) 04/28/2017 1032   APPEARANCEUR Turbid 09/19/2014 1914   LABSPEC 1.011 04/28/2017 1032   LABSPEC 1.011 09/19/2014 1914   PHURINE 7.0 04/28/2017 1032   GLUCOSEU NEGATIVE 04/28/2017 1032   GLUCOSEU Negative 09/19/2014 1914   HGBUR MODERATE (A) 04/28/2017 1032   BILIRUBINUR NEGATIVE 04/28/2017 1032   BILIRUBINUR  Negative 09/19/2014 1914   KETONESUR NEGATIVE 04/28/2017 1032   PROTEINUR 100 (A) 04/28/2017 1032   NITRITE POSITIVE (A) 04/28/2017 1032   LEUKOCYTESUR MODERATE (A) 04/28/2017 1032   LEUKOCYTESUR 2+ 09/19/2014 1914   Sepsis Labs: '@LABRCNTIP'$ (procalcitonin:4,lacticidven:4) ) Recent Results (from the past 240 hour(s))  SARS Coronavirus 2 by RT PCR (hospital order, performed in [place] hospital lab) Nasopharyngeal Nasopharyngeal Swab     Status: None   Collection Time: 06/13/20  1:06 PM   Specimen: Nasopharyngeal Swab  Result Value Ref Range Status   SARS Coronavirus 2 NEGATIVE NEGATIVE Final    Comment: (NOTE) SARS-CoV-2 target nucleic acids are NOT DETECTED.  The SARS-CoV-2 RNA is generally detectable in upper and lower respiratory specimens during the acute phase of infection. The lowest concentration of SARS-CoV-2 viral copies this assay can detect is 250 copies / mL. A negative result does not preclude SARS-CoV-2 infection and should not be used as the sole basis for treatment or other patient management decisions.  A negative result may occur with improper specimen collection / handling, submission of specimen other than nasopharyngeal swab, presence of viral mutation(s) within the areas targeted by this assay, and inadequate number of viral copies (<250 copies / mL). A negative result must be combined with clinical observations, patient history, and epidemiological information.  Fact Sheet for Patients:   StrictlyIdeas.no  Fact Sheet for Healthcare Providers: BankingDealers.co.za  This test is not yet approved or  cleared by the [place] FDA and has been authorized for detection and/or diagnosis of SARS-CoV-2 by FDA under an Emergency Use Authorization (EUA).  This EUA will remain in effect (meaning this test can be used) for the duration of the COVID-19 declaration under Section 564(b)(1) of the Act, 21  U.S.C. section 360bbb-3(b)(1), unless the authorization is terminated or revoked sooner.  Performed at [facility], [address] [place], [place] [address]   SARS Coronavirus 2 by RT PCR (hospital order, performed in [facility] hospital lab) Nasopharyngeal Nasopharyngeal Swab     Status: Abnormal   Collection Time: 06/18/20  5:45 AM   Specimen: Nasopharyngeal Swab  Result Value Ref Range Status   SARS Coronavirus 2 POSITIVE (A) NEGATIVE Final    Comment: RESULT CALLED  TO, READ BACK BY AND VERIFIED WITH:  REINA GALJOUR AT 0710 06/18/20 SDR (NOTE) SARS-CoV-2 target nucleic acids are DETECTED  SARS-CoV-2 RNA is generally detectable in upper respiratory specimens  during the acute phase of infection.  Positive results are indicative  of the presence of the identified virus, but do not rule out bacterial infection or co-infection with other pathogens not detected by the test.  Clinical correlation with patient history and  other diagnostic information is necessary to determine patient infection status.  The expected result is negative.  Fact Sheet for Patients:   StrictlyIdeas.no   Fact Sheet for Healthcare Providers:   BankingDealers.co.za    This test is not yet approved or cleared by the [place] FDA and  has been authorized for detection and/or diagnosis of SARS-CoV-2 by FDA under an Emergency Use Authorization (EUA).  This EUA will remain in effect (meaning this te st can be used) for the duration of  the COVID-19 declaration under Section 564(b)(1) of the Act, 21 U.S.C. section 360-bbb-3(b)(1), unless the authorization is terminated or revoked sooner.  Performed at [facility], [address] [place], [place] [address]      Radiological Exams on Admission: DG Chest 2 View  Result Date: 06/18/2020 CLINICAL DATA:  Weakness EXAM: CHEST - 2 VIEW COMPARISON:  03/28/2020 FINDINGS: Mild cardiomegaly.  No focal airspace consolidation or pulmonary edema. Small left pleural effusion. Right subclavian stent. IMPRESSION: Small left pleural effusion. Electronically Signed   By: Ulyses Jarred M.D.   On: 06/18/2020 02:52     EKG: I have personally reviewed.  Sinus rhythm, QTC 498, low voltage, nonspecific T wave change, no T wave peaking  Assessment/Plan Principal Problem:   Hyperkalemia Active Problems:   ESRD (end stage renal disease) (HCC)   Essential hypertension   Anemia in ESRD (end-stage renal disease) (HCC)   COVID-19 virus infection   Stroke (HCC)   Thrombocytopenia (HCC)   Elevated troponin   Hyperkalemia and hx of ESRD (end stage renal disease): Potassium level 6.8.  No T wave peaking.  Dr. Juleen China of nephrology is consulted for dialysis.  -Admit to progressive bed as inpatient --> after finish dialysis, can change to MedSurg bed -Patient was treated with 1 g of calcium gluconate, D50 and 5 units of NovoLog, 10 g of leukoma -Dialysis per renal  Essential hypertension: Patient used to take the Coreg, but is not taking his medications currently.  Blood pressure 154/85 -Restart Coreg 6.25 mg daily -IV hydralazine as needed  Anemia in ESRD (end-stage renal disease) ([place]): Hemoglobin 10.5 (8.4 on 06/15/2020), stable -Follow-up with CBC  COVID-19 virus infection: Patient has positive Covid PCR, but no oxygen desaturation.  Chest x-ray has no infiltration.  Does not need them dysuria or steroid. -As needed albuterol and Mucinex -Start vitamin C and zinc sulfate  Stroke [facility]): Patient is not taking aspirin currently -Aspirin 81 mg is restarted  Thrombocytopenia ([place]): Platelet 112.  May be due to ongoing Covid infection -Check LDH and peripheral smear  Elevated troponin: Troponin 57, 53, possibly due to decreased clearance secondary to end-stage renal disease and possible demand ischemia -Trend troponin -Aspirin -Check A1c, FLP    DVT ppx: SQ Heparin  Code Status: Full  code Family Communication:  Yes, patient's mother by phone Disposition Plan:  Anticipate discharge back to previous environment Consults called:  Dr. Juleen China of renal Admission status and Level of care: Progressive Cardiac as inpt      Status is: Inpatient Remains inpatient  appropriate because:Inpatient level of care appropriate due to severity of illness .  Patient has multiple comorbidities, now presents with hyperkalemia, thrombocytopenia, elevated troponin, also has positive COVID-19 infection.  His presentation is highly complicated.  Patient is at high risk of deteriorating.  Need to be treated in hospital for at least 2 days.  Dispo: The patient is from: Home              Anticipated d/c is to: Home              Anticipated d/c date is: 2 days              Patient currently is not medically stable to d/c.   Difficult to place patient No      Date of Service 06/18/2020    [facility]   If 7PM-7AM, please contact night-coverage [email or web address] 06/18/2020, 9:03 AM

## 2020-06-18 NOTE — ED Notes (Signed)
Patient given ED sandwich tray. Patient given menu and ED phone to call his dinner order.

## 2020-06-18 NOTE — ED Notes (Signed)
Moved to room 17 for emergent dialysis.

## 2020-06-18 NOTE — ED Notes (Signed)
Pt refused finger stick at this time

## 2020-06-19 LAB — HEPATITIS B SURFACE ANTIBODY,QUALITATIVE: Hep B S Ab: NONREACTIVE

## 2020-06-19 LAB — MRSA PCR SCREENING: MRSA by PCR: NEGATIVE

## 2020-06-19 LAB — HEPATITIS B CORE ANTIBODY, IGM: Hep B C IgM: NONREACTIVE

## 2020-06-19 LAB — HEPATITIS B SURFACE ANTIGEN: Hepatitis B Surface Ag: NONREACTIVE

## 2020-06-19 NOTE — Discharge Summary (Signed)
Physician Discharge Summary  [name] [id number] DOB: Jun 20, 1970 DOA: 06/18/2020  PCP: [facility] [place] date: 06/18/2020 Discharge date: 06/25/2020  Admitted From: home Disposition:  home  Recommendations for Outpatient Follow-up:  1. Follow up with PCP in 1-2 weeks 2. Please obtain BMP/CBC in one week 3. Do not miss any dialysis sessions. 4. Follow up with nephrology as scheduled.  Home Health: None Equipment/Devices: None  Discharge Condition: stable, improved  CODE STATUS: full Diet recommendation: Renal with fluid restriction   Discharge Diagnoses: Principal Problem:   Hyperkalemia Active Problems:   ESRD (end stage renal disease) (HCC)   Essential hypertension   Anemia in ESRD (end-stage renal disease) (HCC)   COVID-19 virus infection   Stroke (HCC)   Thrombocytopenia (HCC)   Elevated troponin    Summary of HPI and Hospital Course:  Per H&P by Dr. Blaine Hamper: "Larry Morrison is a 50 y.o. male with medical history significant of hypertension, stroke, ESRD-HD, brain aneurysm, anemia, right nephrostomy, kidney stone, former smoker, who presents with generalized weakness and fatigue.   Pt was recently hospitalized from 1/23-1/24 due to hyperkalemia. He received dialysis prior to discharge. Pt states that he has been feeling bad in the past 2 days. He has generalized weakness and fatigue.  No unilateral numbness or tingling in extremities.  No facial droop or slurred speech. He denies fevers, chills, chest pain, shortness of breath, cough, nausea, vomiting, diarrhea, abdominal pain.  No symptoms of UTI. Pt reports his nephrostomy tube has been draining appropriately. He is scheduled for dialysis in the morning but reports he was feeling so poorly he had to come back to the emergency department.   ED Course: pt was found to have positive COVID-19 PCR, pending urinalysis, WBC 4.3, troponin level 57, 53, calcium 8.9, potassium 6.8, bicarbonate of 23, creatinine  10.6, BUN 69, temperature normal, blood pressure 154/85, heart rate 85, RR 18, oxygen saturation 97% on room air.  Chest x-ray is negative for infiltration, but showed small pleural effusion.  Patient is admitted to progressive bed as inpatient.  Nephrology consulted for dialysis.  Patient had urgent dialysis day of admission.  Clinically improved and stable.  Repeat dialysis recommended and patient initially refused on 1/29, was to be discharged AMA but he later agreed.  Patient's behavior toward staff was combative with cursing and physically threatening gestures (swinging fist etc).   Patient ultimately agreed to dialysis later on 1/29 and discharged afterward in stable condition.    Discharge Instructions   Discharge Instructions    Call MD for:  extreme fatigue   Complete by: As directed    Call MD for:  persistant dizziness or light-headedness   Complete by: As directed    Call MD for:  persistant nausea and vomiting   Complete by: As directed    Call MD for:  severe uncontrolled pain   Complete by: As directed    Call MD for:  temperature >100.4   Complete by: As directed    Diet - low sodium heart healthy   Complete by: As directed    Discharge instructions   Complete by: As directed    Attend all of your dialysis sessions to avoid having to come into the hospital in emergency situation. It is CRITICALLY important that you get your dialysis.  Missing dialysis puts you at risk of severe complications including death from cardiac arrhythmias from high potassium.   Increase activity slowly   Complete by: As directed  Allergies as of 06/20/2020      Reactions   Vancomycin    Patient denies      Medication List    You have not been prescribed any medications.     Allergies  Allergen Reactions  . Vancomycin     Patient denies     If you experience worsening of your admission symptoms, develop shortness of breath, life threatening emergency, suicidal or homicidal  thoughts you must seek medical attention immediately by calling 911 or calling your MD immediately  if symptoms less severe.    Please note   You were cared for by a hospitalist during your hospital stay. If you have any questions about your discharge medications or the care you received while you were in the hospital after you are discharged, you can call the unit and asked to speak with the hospitalist on call if the hospitalist that took care of you is not available. Once you are discharged, your primary care physician will handle any further medical issues. Please note that NO REFILLS for any discharge medications will be authorized once you are discharged, as it is imperative that you return to your primary care physician (or establish a relationship with a primary care physician if you do not have one) for your aftercare needs so that they can reassess your need for medications and monitor your lab values.   Consultations:  Nephrology   Procedures/Studies: DG Chest 2 View  Result Date: 06/18/2020 CLINICAL DATA:  Weakness EXAM: CHEST - 2 VIEW COMPARISON:  03/28/2020 FINDINGS: Mild cardiomegaly. No focal airspace consolidation or pulmonary edema. Small left pleural effusion. Right subclavian stent. IMPRESSION: Small left pleural effusion. Electronically Signed   By: Ulyses Jarred M.D.   On: 06/18/2020 02:52       Subjective: Pt seen this AM after nursing reported patient told them he is not going to dialysis today, and refusing his care from them in general, being combative towards nursing staff.  When pt seen, advised of recommended repeat dialysis today.  Pt said he was too tired and was not going to get dialysis.  Patient ultimately said he would leave the hospital and go home against medical advice, adamant not to get dialysis.  1/30 Addendum - patient later in the day agreed to have dialysis.  He was to discharge home afterward, however it was apparently too late in the evening and  patient stayed until this morning.  Discharge Exam: Vitals:   06/19/20 2100 06/20/20 0020  BP: (!) 152/80 136/83  Pulse:  88  Resp: (!) 23 20  Temp:  99.4 F (37.4 C)  SpO2:  100%   Vitals:   06/19/20 1932 06/19/20 1935 06/19/20 2100 06/20/20 0020  BP: 118/61 123/66 (!) 152/80 136/83  Pulse: 71 71  88  Resp: (!) 28 (!) 31 (!) 23 20  Temp:  98 F (36.7 C)  99.4 F (37.4 C)  TempSrc:  Oral  Oral  SpO2: 100% 100%  100%  Weight:      Height:        CAVEAT: physical exam limited by patient agitation, combativeness, refusal   General: Pt is alert, awake, not in acute distress, angry and agitated, swings fist Cardiovascular: unable to examine Respiratory: unable to examine Abdominal: unable to examine Psychiatric: agitated, combative, abnormal judgment and insight    The results of significant diagnostics from this hospitalization (including imaging, microbiology, ancillary and laboratory) are listed below for reference.     Microbiology: Recent Results (  from the past 240 hour(s))  SARS Coronavirus 2 by RT PCR (hospital order, performed in [facility] hospital lab) Nasopharyngeal Nasopharyngeal Swab     Status: Abnormal   Collection Time: 06/18/20  5:45 AM   Specimen: Nasopharyngeal Swab  Result Value Ref Range Status   SARS Coronavirus 2 POSITIVE (A) NEGATIVE Final    Comment: RESULT CALLED TO, READ BACK BY AND VERIFIED WITH:  REINA GALJOUR AT 0710 06/18/20 SDR (NOTE) SARS-CoV-2 target nucleic acids are DETECTED  SARS-CoV-2 RNA is generally detectable in upper respiratory specimens  during the acute phase of infection.  Positive results are indicative  of the presence of the identified virus, but do not rule out bacterial infection or co-infection with other pathogens not detected by the test.  Clinical correlation with patient history and  other diagnostic information is necessary to determine patient infection status.  The expected result is negative.  Fact Sheet  for Patients:   StrictlyIdeas.no   Fact Sheet for Healthcare Providers:   BankingDealers.co.za    This test is not yet approved or cleared by the [place] FDA and  has been authorized for detection and/or diagnosis of SARS-CoV-2 by FDA under an Emergency Use Authorization (EUA).  This EUA will remain in effect (meaning this te st can be used) for the duration of  the COVID-19 declaration under Section 564(b)(1) of the Act, 21 U.S.C. section 360-bbb-3(b)(1), unless the authorization is terminated or revoked sooner.  Performed at [facility], [address] [place], [place] [address]   MRSA PCR Screening     Status: None   Collection Time: 06/19/20  6:00 AM   Specimen: Nasal Mucosa; Nasopharyngeal  Result Value Ref Range Status   MRSA by PCR NEGATIVE NEGATIVE Final    Comment:        The GeneXpert MRSA Assay (FDA approved for NASAL specimens only), is one component of a comprehensive MRSA colonization surveillance program. It is not intended to diagnose MRSA infection nor to guide or monitor treatment for MRSA infections. Performed at [facility], [address] [place],  [address]      Labs: BNP (last 3 results) No results for input(s): BNP in the last 8760 hours. Basic Metabolic Panel: No results for input(s): NA, K, CL, CO2, GLUCOSE, BUN, CREATININE, CALCIUM, MG, PHOS in the last 168 hours. Liver Function Tests: No results for input(s): AST, ALT, ALKPHOS, BILITOT, PROT, ALBUMIN in the last 168 hours. No results for input(s): LIPASE, AMYLASE in the last 168 hours. No results for input(s): AMMONIA in the last 168 hours. CBC: No results for input(s): WBC, NEUTROABS, HGB, HCT, MCV, PLT in the last 168 hours. Cardiac Enzymes: No results for input(s): CKTOTAL, CKMB, CKMBINDEX, TROPONINI in the last 168 hours. BNP: Invalid input(s): POCBNP CBG: No results for input(s): GLUCAP in the last  168 hours. D-Dimer No results for input(s): DDIMER in the last 72 hours. Hgb A1c No results for input(s): HGBA1C in the last 72 hours. Lipid Profile No results for input(s): CHOL, HDL, LDLCALC, TRIG, CHOLHDL, LDLDIRECT in the last 72 hours. Thyroid function studies No results for input(s): TSH, T4TOTAL, T3FREE, THYROIDAB in the last 72 hours.  Invalid input(s): FREET3 Anemia work up No results for input(s): VITAMINB12, FOLATE, FERRITIN, TIBC, IRON, RETICCTPCT in the last 72 hours. Urinalysis    Component Value Date/Time   COLORURINE AMBER (A) 04/28/2017 1032   APPEARANCEUR TURBID (A) 04/28/2017 1032   APPEARANCEUR Turbid 09/19/2014 1914   LABSPEC 1.011 04/28/2017 1032  LABSPEC 1.011 09/19/2014 1914   PHURINE 7.0 04/28/2017 1032   GLUCOSEU NEGATIVE 04/28/2017 1032   GLUCOSEU Negative 09/19/2014 1914   HGBUR MODERATE (A) 04/28/2017 1032   BILIRUBINUR NEGATIVE 04/28/2017 1032   BILIRUBINUR Negative 09/19/2014 1914   KETONESUR NEGATIVE 04/28/2017 1032   PROTEINUR 100 (A) 04/28/2017 1032   NITRITE POSITIVE (A) 04/28/2017 1032   LEUKOCYTESUR MODERATE (A) 04/28/2017 1032   LEUKOCYTESUR 2+ 09/19/2014 1914   Sepsis Labs Invalid input(s): PROCALCITONIN,  WBC,  LACTICIDVEN Microbiology Recent Results (from the past 240 hour(s))  SARS Coronavirus 2 by RT PCR (hospital order, performed in [facility] hospital lab) Nasopharyngeal Nasopharyngeal Swab     Status: Abnormal   Collection Time: 06/18/20  5:45 AM   Specimen: Nasopharyngeal Swab  Result Value Ref Range Status   SARS Coronavirus 2 POSITIVE (A) NEGATIVE Final    Comment: RESULT CALLED TO, READ BACK BY AND VERIFIED WITH:  REINA GALJOUR AT 0710 06/18/20 SDR (NOTE) SARS-CoV-2 target nucleic acids are DETECTED  SARS-CoV-2 RNA is generally detectable in upper respiratory specimens  during the acute phase of infection.  Positive results are indicative  of the presence of the identified virus, but do not rule out bacterial  infection or co-infection with other pathogens not detected by the test.  Clinical correlation with patient history and  other diagnostic information is necessary to determine patient infection status.  The expected result is negative.  Fact Sheet for Patients:   StrictlyIdeas.no   Fact Sheet for Healthcare Providers:   BankingDealers.co.za    This test is not yet approved or cleared by the [place] FDA and  has been authorized for detection and/or diagnosis of SARS-CoV-2 by FDA under an Emergency Use Authorization (EUA).  This EUA will remain in effect (meaning this te st can be used) for the duration of  the COVID-19 declaration under Section 564(b)(1) of the Act, 21 U.S.C. section 360-bbb-3(b)(1), unless the authorization is terminated or revoked sooner.  Performed at [facility], [address] [place], [place] [address]   MRSA PCR Screening     Status: None   Collection Time: 06/19/20  6:00 AM   Specimen: Nasal Mucosa; Nasopharyngeal  Result Value Ref Range Status   MRSA by PCR NEGATIVE NEGATIVE Final    Comment:        The GeneXpert MRSA Assay (FDA approved for NASAL specimens only), is one component of a comprehensive MRSA colonization surveillance program. It is not intended to diagnose MRSA infection nor to guide or monitor treatment for MRSA infections. Performed at [facility], [address] [place],  [address]      Time coordinating discharge: Over 30 minutes  SIGNED:   Ezekiel Slocumb, DO [facility] 06/25/2020, 3:22 PM   If 7PM-7AM, please contact night-coverage [email or web address]

## 2020-06-19 NOTE — Progress Notes (Signed)
Pt refusae lab work. MD, Darlyne Russian, aware

## 2020-06-19 NOTE — Discharge Summary (Incomplete)
Physician Discharge Summary  Larry Morrison DOB: 07-06-1970 DOA: 06/18/2020  PCP: [facility] [place] date: 06/18/2020 Discharge date: 06/19/2020    Admitted From: *** Disposition:  ***  Recommendations for Outpatient Follow-up:  1. Follow up with PCP in 1-2 weeks 2. Please obtain BMP/CBC in one week 3. Please follow up on the following pending results: ***  Home Health: ***  Equipment/Devices: ***   Discharge Condition: ***  CODE STATUS: ***  Diet recommendation: *** Heart Healthy / Carb Modified / Regular / Dysphagia   Discharge Diagnoses: Principal Problem:   Hyperkalemia Active Problems:   ESRD (end stage renal disease) (HCC)   Essential hypertension   Anemia in ESRD (end-stage renal disease) (HCC)   COVID-19 virus infection   Stroke (HCC)   Thrombocytopenia (HCC)   Elevated troponin    Summary of HPI and Hospital Course:  No notes on file   *** - he refuses all labs, dialysis and other care today - he curses at all staff that attempt to talk to him - ultimately agreed to dialysis, stable for discharge and resume outpatient dialysis  Discharge Instructions ***   Allergies as of 06/19/2020      Reactions   Vancomycin    Patient denies    Med Rec must be completed prior to using this [place]***       Allergies  Allergen Reactions  . Vancomycin     Patient denies     If you experience worsening of your admission symptoms, develop shortness of breath, life threatening emergency, suicidal or homicidal thoughts you must seek medical attention immediately by calling 911 or calling your MD immediately  if symptoms less severe.    Please note   You were cared for by a hospitalist during your hospital stay. If you have any questions about your discharge medications or the care you received while you were in the hospital after you are discharged, you can call the unit and asked to speak with the hospitalist on call if the  hospitalist that took care of you is not available. Once you are discharged, your primary care physician will handle any further medical issues. Please note that NO REFILLS for any discharge medications will be authorized once you are discharged, as it is imperative that you return to your primary care physician (or establish a relationship with a primary care physician if you do not have one) for your aftercare needs so that they can reassess your need for medications and monitor your lab values.   Consultations:  ***    Procedures/Studies: DG Chest 2 View  Result Date: 06/18/2020 CLINICAL DATA:  Weakness EXAM: CHEST - 2 VIEW COMPARISON:  03/28/2020 FINDINGS: Mild cardiomegaly. No focal airspace consolidation or pulmonary edema. Small left pleural effusion. Right subclavian stent. IMPRESSION: Small left pleural effusion. Electronically Signed   By: Ulyses Jarred M.D.   On: 06/18/2020 02:52      ***  (Echo, Carotid, EGD, Colonoscopy, ERCP)    Subjective: ***   Discharge Exam: Vitals:   06/19/20 0000 06/19/20 0544  BP: 124/73 117/69  Pulse: 81 74  Resp: (!) 22 20  Temp:  98.6 F (37 C)  SpO2: 98% 99%   Vitals:   06/18/20 2000 06/18/20 2200 06/19/20 0000 06/19/20 0544  BP: 131/73 123/68 124/73 117/69  Pulse: 84 80 81 74  Resp: 14 (!) 25 (!) 22 20  Temp:  98.2 F (36.8 C)  98.6 F (37 C)  TempSrc:  Oral  SpO2: 100% 99% 98% 99%  Weight:      Height:        General: Pt is alert, awake, not in acute distress Cardiovascular: ***RRR, S1/S2 +, no rubs, no gallops Respiratory: CTA bilaterally, no wheezing, no rhonchi Abdominal: Soft, NT, ND, bowel sounds + Extremities: ***no edema, no cyanosis    The results of significant diagnostics from this hospitalization (including imaging, microbiology, ancillary and laboratory) are listed below for reference.     Microbiology: Recent Results (from the past 240 hour(s))  SARS Coronavirus 2 by RT PCR (hospital order, performed  in [facility] hospital lab) Nasopharyngeal Nasopharyngeal Swab     Status: None   Collection Time: 06/13/20  1:06 PM   Specimen: Nasopharyngeal Swab  Result Value Ref Range Status   SARS Coronavirus 2 NEGATIVE NEGATIVE Final    Comment: (NOTE) SARS-CoV-2 target nucleic acids are NOT DETECTED.  The SARS-CoV-2 RNA is generally detectable in upper and lower respiratory specimens during the acute phase of infection. The lowest concentration of SARS-CoV-2 viral copies this assay can detect is 250 copies / mL. A negative result does not preclude SARS-CoV-2 infection and should not be used as the sole basis for treatment or other patient management decisions.  A negative result may occur with improper specimen collection / handling, submission of specimen other than nasopharyngeal swab, presence of viral mutation(s) within the areas targeted by this assay, and inadequate number of viral copies (<250 copies / mL). A negative result must be combined with clinical observations, patient history, and epidemiological information.  Fact Sheet for Patients:   StrictlyIdeas.no  Fact Sheet for Healthcare Providers: BankingDealers.co.za  This test is not yet approved or  cleared by the [place] FDA and has been authorized for detection and/or diagnosis of SARS-CoV-2 by FDA under an Emergency Use Authorization (EUA).  This EUA will remain in effect (meaning this test can be used) for the duration of the COVID-19 declaration under Section 564(b)(1) of the Act, 21 U.S.C. section 360bbb-3(b)(1), unless the authorization is terminated or revoked sooner.  Performed at [facility], [address] [place], [place] [address]   SARS Coronavirus 2 by RT PCR (hospital order, performed in [facility] hospital lab) Nasopharyngeal Nasopharyngeal Swab     Status: Abnormal   Collection Time: 06/18/20  5:45 AM   Specimen: Nasopharyngeal Swab  Result  Value Ref Range Status   SARS Coronavirus 2 POSITIVE (A) NEGATIVE Final    Comment: RESULT CALLED TO, READ BACK BY AND VERIFIED WITH:  REINA GALJOUR AT 0710 06/18/20 SDR (NOTE) SARS-CoV-2 target nucleic acids are DETECTED  SARS-CoV-2 RNA is generally detectable in upper respiratory specimens  during the acute phase of infection.  Positive results are indicative  of the presence of the identified virus, but do not rule out bacterial infection or co-infection with other pathogens not detected by the test.  Clinical correlation with patient history and  other diagnostic information is necessary to determine patient infection status.  The expected result is negative.  Fact Sheet for Patients:   StrictlyIdeas.no   Fact Sheet for Healthcare Providers:   BankingDealers.co.za    This test is not yet approved or cleared by the [place] FDA and  has been authorized for detection and/or diagnosis of SARS-CoV-2 by FDA under an Emergency Use Authorization (EUA).  This EUA will remain in effect (meaning this te st can be used) for the duration of  the COVID-19 declaration under Section 564(b)(1) of the  Act, 21 U.S.C. section 360-bbb-3(b)(1), unless the authorization is terminated or revoked sooner.  Performed at [facility], [address] [place], [place] [address]   MRSA PCR Screening     Status: None   Collection Time: 06/19/20  6:00 AM   Specimen: Nasal Mucosa; Nasopharyngeal  Result Value Ref Range Status   MRSA by PCR NEGATIVE NEGATIVE Final    Comment:        The GeneXpert MRSA Assay (FDA approved for NASAL specimens only), is one component of a comprehensive MRSA colonization surveillance program. It is not intended to diagnose MRSA infection nor to guide or monitor treatment for MRSA infections. Performed at [facility], [address] [place], [place] [address]      Labs: BNP (last 3 results) No  results for input(s): BNP in the last 8760 hours. Basic Metabolic Panel: Recent Labs  Lab 06/13/20 1146 06/14/20 0926 06/15/20 0413 06/18/20 0251  NA 145  --  139 141  K 6.5*  --  3.8 6.8*  CL 109  --  100 104  CO2 15*  --  27 23  GLUCOSE 97  --  98 81  BUN 120*  --  43* 69*  CREATININE 14.55*  --  6.81* 10.60*  CALCIUM 9.4  --  7.7* 8.9  MG 2.5* 2.1  --   --   PHOS  --   --  4.6  --    Liver Function Tests: Recent Labs  Lab 06/13/20 1146 06/15/20 0413  AST 30  --   ALT 45*  --   ALKPHOS 59  --   BILITOT 1.6*  --   PROT 8.3*  --   ALBUMIN 3.3* 2.6*   Recent Labs  Lab 06/13/20 1146  LIPASE 37   No results for input(s): AMMONIA in the last 168 hours. CBC: Recent Labs  Lab 06/13/20 1146 06/14/20 0926 06/15/20 0413 06/18/20 0251  WBC 6.4 7.1 6.4 4.3  HGB 10.6* 9.3* 8.4* 10.5*  HCT 32.6* 28.1* 25.7* 33.7*  MCV 98.5 97.9 98.1 102.4*  PLT 204 134* 122* 112*   Cardiac Enzymes: No results for input(s): CKTOTAL, CKMB, CKMBINDEX, TROPONINI in the last 168 hours. BNP: Invalid input(s): POCBNP CBG: No results for input(s): GLUCAP in the last 168 hours. D-Dimer No results for input(s): DDIMER in the last 72 hours. Hgb A1c No results for input(s): HGBA1C in the last 72 hours. Lipid Profile No results for input(s): CHOL, HDL, LDLCALC, TRIG, CHOLHDL, LDLDIRECT in the last 72 hours. Thyroid function studies No results for input(s): TSH, T4TOTAL, T3FREE, THYROIDAB in the last 72 hours.  Invalid input(s): FREET3 Anemia work up No results for input(s): VITAMINB12, FOLATE, FERRITIN, TIBC, IRON, RETICCTPCT in the last 72 hours. Urinalysis    Component Value Date/Time   COLORURINE AMBER (A) 04/28/2017 1032   APPEARANCEUR TURBID (A) 04/28/2017 1032   APPEARANCEUR Turbid 09/19/2014 1914   LABSPEC 1.011 04/28/2017 1032   LABSPEC 1.011 09/19/2014 1914   PHURINE 7.0 04/28/2017 1032   GLUCOSEU NEGATIVE 04/28/2017 1032   GLUCOSEU Negative 09/19/2014 1914   HGBUR  MODERATE (A) 04/28/2017 1032   BILIRUBINUR NEGATIVE 04/28/2017 1032   BILIRUBINUR Negative 09/19/2014 1914   KETONESUR NEGATIVE 04/28/2017 1032   PROTEINUR 100 (A) 04/28/2017 1032   NITRITE POSITIVE (A) 04/28/2017 1032   LEUKOCYTESUR MODERATE (A) 04/28/2017 1032   LEUKOCYTESUR 2+ 09/19/2014 1914   Sepsis Labs Invalid input(s): PROCALCITONIN,  WBC,  LACTICIDVEN Microbiology Recent Results (from the past 240 hour(s))  SARS Coronavirus 2 by RT PCR (hospital order, performed in [facility] hospital lab) Nasopharyngeal Nasopharyngeal Swab     Status: None   Collection Time: 06/13/20  1:06 PM   Specimen: Nasopharyngeal Swab  Result Value Ref Range Status   SARS Coronavirus 2 NEGATIVE NEGATIVE Final    Comment: (NOTE) SARS-CoV-2 target nucleic acids are NOT DETECTED.  The SARS-CoV-2 RNA is generally detectable in upper and lower respiratory specimens during the acute phase of infection. The lowest concentration of SARS-CoV-2 viral copies this assay can detect is 250 copies / mL. A negative result does not preclude SARS-CoV-2 infection and should not be used as the sole basis for treatment or other patient management decisions.  A negative result may occur with improper specimen collection / handling, submission of specimen other than nasopharyngeal swab, presence of viral mutation(s) within the areas targeted by this assay, and inadequate number of viral copies (<250 copies / mL). A negative result must be combined with clinical observations, patient history, and epidemiological information.  Fact Sheet for Patients:   StrictlyIdeas.no  Fact Sheet for Healthcare Providers: BankingDealers.co.za  This test is not yet approved or  cleared by the [place] FDA and has been authorized for detection and/or diagnosis of SARS-CoV-2 by FDA under an Emergency Use Authorization (EUA).  This EUA will remain in effect (meaning this test can be  used) for the duration of the COVID-19 declaration under Section 564(b)(1) of the Act, 21 U.S.C. section 360bbb-3(b)(1), unless the authorization is terminated or revoked sooner.  Performed at [facility], [address] [place], [place] [address]   SARS Coronavirus 2 by RT PCR (hospital order, performed in [facility] hospital lab) Nasopharyngeal Nasopharyngeal Swab     Status: Abnormal   Collection Time: 06/18/20  5:45 AM   Specimen: Nasopharyngeal Swab  Result Value Ref Range Status   SARS Coronavirus 2 POSITIVE (A) NEGATIVE Final    Comment: RESULT CALLED TO, READ BACK BY AND VERIFIED WITH:  REINA GALJOUR AT 0710 06/18/20 SDR (NOTE) SARS-CoV-2 target nucleic acids are DETECTED  SARS-CoV-2 RNA is generally detectable in upper respiratory specimens  during the acute phase of infection.  Positive results are indicative  of the presence of the identified virus, but do not rule out bacterial infection or co-infection with other pathogens not detected by the test.  Clinical correlation with patient history and  other diagnostic information is necessary to determine patient infection status.  The expected result is negative.  Fact Sheet for Patients:   StrictlyIdeas.no   Fact Sheet for Healthcare Providers:   BankingDealers.co.za    This test is not yet approved or cleared by the [place] FDA and  has been authorized for detection and/or diagnosis of SARS-CoV-2 by FDA under an Emergency Use Authorization (EUA).  This EUA will remain in effect (meaning this te st can be used) for the duration of  the COVID-19 declaration under Section 564(b)(1) of the Act, 21 U.S.C. section 360-bbb-3(b)(1), unless the authorization is terminated or revoked sooner.  Performed at [facility], [address] [place], [place] [address]   MRSA PCR Screening     Status: None   Collection Time: 06/19/20  6:00 AM   Specimen:  Nasal Mucosa; Nasopharyngeal  Result Value Ref Range Status   MRSA by PCR NEGATIVE NEGATIVE Final    Comment:        The GeneXpert MRSA Assay (FDA approved for NASAL specimens only), is one component of a comprehensive MRSA  colonization surveillance program. It is not intended to diagnose MRSA infection nor to guide or monitor treatment for MRSA infections. Performed at [facility], [address] [place], [place] [address]      Time coordinating discharge: Over 30 minutes  SIGNED:   Ezekiel Slocumb, DO [facility] 06/19/2020, 11:10 AM   If 7PM-7AM, please contact night-coverage [email or web address] \

## 2020-06-19 NOTE — Progress Notes (Signed)
Pt stated he would not have a ride home following HD. Social worker, Amalia Greenhouse notified. Per SW will print out vouchers. Vouchers not yet printed.

## 2020-06-19 NOTE — Progress Notes (Signed)
Consent signed and placed in chart.  

## 2020-06-19 NOTE — Progress Notes (Signed)
Pt transported to HD.

## 2020-06-19 NOTE — Progress Notes (Signed)
Larry Morrison  MRN: [id number]  DOB/AGE: Feb 21, 1971 50 y.o.  Primary Care Physician:[facility] [place] date: 06/18/2020  Chief Complaint:  Chief Complaint  Patient presents with  . Weakness    S-Pt presented on  06/18/2020 with  Chief Complaint  Patient presents with  . Weakness  . Patient was lying down comfortably on the bed.  Patient was no physical specific concerns. Patient question to me was "when can I get to my dialysis?" Patient was belligerent earlier and did not allow blood draw   Medications   . vitamin C  500 mg Oral Daily  . aspirin EC  81 mg Oral Daily  . carvedilol  6.25 mg Oral BID WC  . dextrose  50 mL Intravenous Once  . epoetin (EPOGEN/PROCRIT) injection  10,000 Units Intravenous Q T,Th,Sa-HD  . heparin  5,000 Units Subcutaneous Q8H  . insulin aspart  5 Units Intravenous Once  . zinc sulfate  220 mg Oral Daily         [id number] from the symptoms mentioned above,there are no other symptoms referable to all systems reviewed.  Physical Exam: Vital signs in last 24 hours: Temp:  [98.2 F (36.8 C)-98.6 F (37 C)] 98.6 F (37 C) (01/29 0544) Pulse Rate:  [73-103] 74 (01/29 0544) Resp:  [14-31] 20 (01/29 0544) BP: (99-153)/(50-92) 117/69 (01/29 0544) SpO2:  [97 %-100 %] 99 % (01/29 0544) Weight change:  Last BM Date: 06/17/20  Intake/Output from previous day: 01/28 0701 - 01/29 0700 In: 0  Out: 2000  No intake/output data recorded.   Physical Exam:  General- pt is awake,alert, oriented to time place and person  Resp- No acute REsp distress, CTA B/L NO Rhonchi  CVS- S1S2 regular in rate and rhythm  GIT- BS+, soft, Non tender , Non distended  EXT- No LE Edema,  No Cyanosis  Access- AVF+ bruit   Lab Results:  CBC  Recent Labs    06/18/20 0251  WBC 4.3  HGB 10.5*  HCT 33.7*  PLT 112*    BMET  Recent Labs    06/18/20 0251  NA 141  K 6.8*  CL 104  CO2 23  GLUCOSE 81  BUN 69*  CREATININE 10.60*   CALCIUM 8.9      Most recent Creatinine trend  Lab Results  Component Value Date   CREATININE 10.60 (H) 06/18/2020   CREATININE 6.81 (H) 06/15/2020   CREATININE 14.55 (H) 06/13/2020      MICRO   Recent Results (from the past 240 hour(s))  SARS Coronavirus 2 by RT PCR (hospital order, performed in [facility] hospital lab) Nasopharyngeal Nasopharyngeal Swab     Status: None   Collection Time: 06/13/20  1:06 PM   Specimen: Nasopharyngeal Swab  Result Value Ref Range Status   SARS Coronavirus 2 NEGATIVE NEGATIVE Final    Comment: (NOTE) SARS-CoV-2 target nucleic acids are NOT DETECTED.  The SARS-CoV-2 RNA is generally detectable in upper and lower respiratory specimens during the acute phase of infection. The lowest concentration of SARS-CoV-2 viral copies this assay can detect is 250 copies / mL. A negative result does not preclude SARS-CoV-2 infection and should not be used as the sole basis for treatment or other patient management decisions.  A negative result may occur with improper specimen collection / handling, submission of specimen other than nasopharyngeal swab, presence of viral mutation(s) within the areas targeted by this assay, and inadequate number of viral copies (<250 copies / mL). A negative result must  be combined with clinical observations, patient history, and epidemiological information.  Fact Sheet for Patients:   StrictlyIdeas.no  Fact Sheet for Healthcare Providers: BankingDealers.co.za  This test is not yet approved or  cleared by the [place] FDA and has been authorized for detection and/or diagnosis of SARS-CoV-2 by FDA under an Emergency Use Authorization (EUA).  This EUA will remain in effect (meaning this test can be used) for the duration of the COVID-19 declaration under Section 564(b)(1) of the Act, 21 U.S.C. section 360bbb-3(b)(1), unless the authorization is terminated or revoked  sooner.  Performed at [facility] [address] [place], [place] [address]   SARS Coronavirus 2 by RT PCR (hospital order, performed in [facility] hospital lab) Nasopharyngeal Nasopharyngeal Swab     Status: Abnormal   Collection Time: 06/18/20  5:45 AM   Specimen: Nasopharyngeal Swab  Result Value Ref Range Status   SARS Coronavirus 2 POSITIVE (A) NEGATIVE Final    Comment: RESULT CALLED TO, READ BACK BY AND VERIFIED WITH:  REINA GALJOUR AT 0710 06/18/20 SDR (NOTE) SARS-CoV-2 target nucleic acids are DETECTED  SARS-CoV-2 RNA is generally detectable in upper respiratory specimens  during the acute phase of infection.  Positive results are indicative  of the presence of the identified virus, but do not rule out bacterial infection or co-infection with other pathogens not detected by the test.  Clinical correlation with patient history and  other diagnostic information is necessary to determine patient infection status.  The expected result is negative.  Fact Sheet for Patients:   StrictlyIdeas.no   Fact Sheet for Healthcare Providers:   BankingDealers.co.za    This test is not yet approved or cleared by the [place] FDA and  has been authorized for detection and/or diagnosis of SARS-CoV-2 by FDA under an Emergency Use Authorization (EUA).  This EUA will remain in effect (meaning this te st can be used) for the duration of  the COVID-19 declaration under Section 564(b)(1) of the Act, 21 U.S.C. section 360-bbb-3(b)(1), unless the authorization is terminated or revoked sooner.  Performed at [facility], [address] [place], [place] [address]   MRSA PCR Screening     Status: None   Collection Time: 06/19/20  6:00 AM   Specimen: Nasal Mucosa; Nasopharyngeal  Result Value Ref Range Status   MRSA by PCR NEGATIVE NEGATIVE Final    Comment:        The GeneXpert MRSA Assay (FDA approved for NASAL  specimens only), is one component of a comprehensive MRSA colonization surveillance program. It is not intended to diagnose MRSA infection nor to guide or monitor treatment for MRSA infections. Performed at [facility], [address], [place], [place] [address]          Impression:  [name] is a 50 y.o. black male with ESRD on HD, HTN, stroke, right nephrostomy tube  who presents to [facility] on 06/18/2020 for Hyperkalemia [E87.5]   As in outpt- [facility] MWF [facility] Left AVF 50.5kg   1)Renal    Pt has ESRD. Pt is on  on HD  Emergent hemodialysis treatment was done yesterday Pt is on MWF schedule as outpt . Change to TTS schedule due to COVID diagnosis.  Pt to have next dialysis for today    2)HTN  Blood pressure is stable    3)Anemia of chronic disease  CBC Latest Ref Rng & Units 06/18/2020 06/15/2020 06/14/2020  WBC 4.0 - 10.5 K/uL 4.3 6.4 7.1  Hemoglobin 13.0 - 17.0 g/dL 10.5(L) 8.4(L) 9.3(L)  Hematocrit 39.0 - 52.0 % 33.7(L) 25.7(L) 28.1(L)  Platelets 150 - 400 K/uL 112(L) 122(L) 134(L)       HGb at goal (9--11)   4) Secondary hyperparathyroidism -CKD Mineral-Bone Disorder    Lab Results  Component Value Date   CALCIUM 8.9 06/18/2020   CAION 0.76 (LL) 08/07/2019   PHOS 4.6 06/15/2020    Secondary Hyperparathyroidism present Intact PTH- 414pg/ml high   Phosphorus at goal.   5)COVID-19  6) Electrolytes   BMP Latest Ref Rng & Units 06/18/2020 06/15/2020 06/13/2020  Glucose 70 - 99 mg/dL 81 98 97  BUN 6 - 20 mg/dL 69(H) 43(H) 120(H)  Creatinine 0.61 - 1.24 mg/dL 10.60(H) 6.81(H) 14.55(H)  Sodium 135 - 145 mmol/L 141 139 145  Potassium 3.5 - 5.1 mmol/L 6.8(HH) 3.8 6.5(HH)  Chloride 98 - 111 mmol/L 104 100 109  CO2 22 - 32 mmol/L 23 27 15(L)  Calcium 8.9 - 10.3 mg/dL 8.9 7.7(L) 9.4     Sodium Normonatremic   Potassium Hyperkalemic Secondary to nonadherence to dialysis treatment and diet Pt was dialyzed yesterday  and then will be done today and on Lokelma    7)Acid base  Co2 at goal     Plan:  Patient will be dialyzed today on a lower K bath Educated patient at length to stay away from high potassium foods Educated patient to please go for his regular dialysis treatment      Keria Widrig s Theador Hawthorne 06/19/2020, 8:57 AM

## 2020-06-19 NOTE — Progress Notes (Signed)
Pt declines to set up passward. He gives verbal consent to update his mother. Mothers contact information verified in chart.

## 2020-06-19 NOTE — Progress Notes (Signed)
Refusing lab work

## 2020-06-20 NOTE — Progress Notes (Addendum)
Pt previously transferred from HD back to Room 122a by the orderly on 06/19/2020 at 2129; pt verbalized that he wanted to wait to go home in the am (previous discharge order entered in [facility] by Dr Arbutus Ped) since it was so late tonight; voucher (needs to be completed) for [employer] taxi remains in the pt's discharge envelope to be utilized at the time of discharge

## 2020-06-20 NOTE — TOC Progression Note (Addendum)
Transition of Care [facility]) - Progression Note    Patient Details  Name: Larry Morrison MRN: [id number] Date of Birth: 01-Aug-1970  [facility] CM/SW Contact  Truitt Merle, LCSW Phone Number: 06/20/2020, 10:25 AM  Clinical Narrative:    Taxi voucher printed to the floor on 1/29 at the request of RN Regino Schultze for patient discharge after HD. Additional request received on 1/30 from [place] for CSW to sign taxi voucher. CSW informed RNs that taxi voucher can be signed by charge nurse, as well, to avoid patient discharge delay. CSW dropped off signed taxi voucher with Christia Reading who met CSW at 1C entrance.   UPDATE: Dr. Arbutus Ped requested EMS transport due to patient needing "a lot of assistance." Med necessity form completed and printed on the floor. EMS arranged. Dr. Arbutus Ped and RN's [place] and [place] updated. No further TOC needs.       Expected Discharge Plan and Services           Expected Discharge Date: 06/19/20                                     Social Determinants of Health (SDOH) Interventions    Readmission Risk Interventions No flowsheet data found.

## 2020-06-20 NOTE — Progress Notes (Signed)
Larry Morrison  MRN: [id number]  DOB/AGE: 01/01/1971 50 y.o.  Primary Care Physician:Larry Morrison, [place] date: 06/18/2020  Chief Complaint:  Chief Complaint  Patient presents with  . Weakness    S-Pt presented on  06/18/2020 with  Chief Complaint  Patient presents with  . Weakness  . Patient was lying down comfortably in the chair    Medications   . vitamin C  500 mg Oral Daily  . aspirin EC  81 mg Oral Daily  . carvedilol  6.25 mg Oral BID WC  . dextrose  50 mL Intravenous Once  . epoetin (EPOGEN/PROCRIT) injection  10,000 Units Intravenous Q T,Th,Sa-HD  . heparin  5,000 Units Subcutaneous Q8H  . insulin aspart  5 Units Intravenous Once  . zinc sulfate  220 mg Oral Daily         [id number] from the symptoms mentioned above,there are no other symptoms referable to all systems reviewed.  Physical Exam: Vital signs in last 24 hours: Temp:  [98 F (36.7 C)-99.4 F (37.4 C)] 99.4 F (37.4 C) (01/30 0020) Pulse Rate:  [66-92] 88 (01/30 0020) Resp:  [16-41] 20 (01/30 0020) BP: (115-152)/(61-83) 136/83 (01/30 0020) SpO2:  [96 %-100 %] 100 % (01/30 0020) Weight change:  Last BM Date: 06/18/20  Intake/Output from previous day: 01/29 0701 - 01/30 0700 In: 30 [P.O.:30] Out: 1500 [Urine:500] No intake/output data recorded.   Physical Exam:  General- pt is awake,alert, oriented to time place and person  Resp- No acute REsp distress, CTA B/L NO Rhonchi  CVS- S1S2 regular in rate and rhythm  GIT- BS+, soft, Non tender , Non distended  EXT- No LE Edema,  No Cyanosis  Access- AVF+ bruit   Lab Results:  CBC  Recent Labs    06/18/20 0251  WBC 4.3  HGB 10.5*  HCT 33.7*  PLT 112*    BMET  Recent Labs    06/18/20 0251  NA 141  K 6.8*  CL 104  CO2 23  GLUCOSE 81  BUN 69*  CREATININE 10.60*  CALCIUM 8.9      Most recent Creatinine trend  Lab Results  Component Value Date   CREATININE 10.60 (H) 06/18/2020   CREATININE 6.81 (H)  06/15/2020   CREATININE 14.55 (H) 06/13/2020      MICRO   Recent Results (from the past 240 hour(s))  SARS Coronavirus 2 by RT PCR (hospital order, performed in [facility] hospital lab) Nasopharyngeal Nasopharyngeal Swab     Status: None   Collection Time: 06/13/20  1:06 PM   Specimen: Nasopharyngeal Swab  Result Value Ref Range Status   SARS Coronavirus 2 NEGATIVE NEGATIVE Final    Comment: (NOTE) SARS-CoV-2 target nucleic acids are NOT DETECTED.  The SARS-CoV-2 RNA is generally detectable in upper and lower respiratory specimens during the acute phase of infection. The lowest concentration of SARS-CoV-2 viral copies this assay can detect is 250 copies / mL. A negative result does not preclude SARS-CoV-2 infection and should not be used as the sole basis for treatment or other patient management decisions.  A negative result may occur with improper specimen collection / handling, submission of specimen other than nasopharyngeal swab, presence of viral mutation(s) within the areas targeted by this assay, and inadequate number of viral copies (<250 copies / mL). A negative result must be combined with clinical observations, patient history, and epidemiological information.  Fact Sheet for Patients:   StrictlyIdeas.no  Fact Sheet for Healthcare Providers: BankingDealers.co.za  This test  is not yet approved or  cleared by the [place] and has been authorized for detection and/or diagnosis of SARS-CoV-2 by FDA under an Emergency Use Authorization (EUA).  This EUA will remain in effect (meaning this test can be used) for the duration of the COVID-19 declaration under Section 564(b)(1) of the Act, 21 U.S.C. section 360bbb-3(b)(1), unless the authorization is terminated or revoked sooner.  Performed at [facility], [address] [place], [place] [address]   SARS Coronavirus 2 by RT PCR (hospital order,  performed in [facility] hospital lab) Nasopharyngeal Nasopharyngeal Swab     Status: Abnormal   Collection Time: 06/18/20  5:45 AM   Specimen: Nasopharyngeal Swab  Result Value Ref Range Status   SARS Coronavirus 2 POSITIVE (A) NEGATIVE Final    Comment: RESULT CALLED TO, READ BACK BY AND VERIFIED WITH:  REINA GALJOUR AT 0710 06/18/20 SDR (NOTE) SARS-CoV-2 target nucleic acids are DETECTED  SARS-CoV-2 RNA is generally detectable in upper respiratory specimens  during the acute phase of infection.  Positive results are indicative  of the presence of the identified virus, but do not rule out bacterial infection or co-infection with other pathogens not detected by the test.  Clinical correlation with patient history and  other diagnostic information is necessary to determine patient infection status.  The expected result is negative.  Fact Sheet for Patients:   StrictlyIdeas.no   Fact Sheet for Healthcare Providers:   BankingDealers.co.za    This test is not yet approved or cleared by the [place] FDA and  has been authorized for detection and/or diagnosis of SARS-CoV-2 by FDA under an Emergency Use Authorization (EUA).  This EUA will remain in effect (meaning this te st can be used) for the duration of  the COVID-19 declaration under Section 564(b)(1) of the Act, 21 U.S.C. section 360-bbb-3(b)(1), unless the authorization is terminated or revoked sooner.  Performed at [facility], [address] [place], [place] [address]   MRSA PCR Screening     Status: None   Collection Time: 06/19/20  6:00 AM   Specimen: Nasal Mucosa; Nasopharyngeal  Result Value Ref Range Status   MRSA by PCR NEGATIVE NEGATIVE Final    Comment:        The GeneXpert MRSA Assay (FDA approved for NASAL specimens only), is one component of a comprehensive MRSA colonization surveillance program. It is not intended to diagnose MRSA infection nor  to guide or monitor treatment for MRSA infections. Performed at [facility], [address] [place], [place] [address]          Impression:  [name] is a 50 y.o. black male with ESRD on HD, HTN, stroke, right nephrostomy tube  who presents to [facility] on 06/18/2020 for Hyperkalemia [E87.5]   As in outpt- [facility] MWF [facility] Left AVF 50.5kg   1)Renal    Pt has ESRD. Pt is on  on HD  Emergent hemodialysis treatment was done on Friday Pt is on MWF schedule as outpt . Change to TTS schedule due to COVID diagnosis.  Patient was last dialyzed yesterday on Saturday No need for HD today.   2)HTN  Blood pressure is stable    3)Anemia of chronic disease  CBC Latest Ref Rng & Units 06/18/2020 06/15/2020 06/14/2020  WBC 4.0 - 10.5 K/uL 4.3 6.4 7.1  Hemoglobin 13.0 - 17.0 g/dL 10.5(L) 8.4(L) 9.3(L)  Hematocrit 39.0 - 52.0 % 33.7(L) 25.7(L) 28.1(L)  Platelets 150 - 400  K/uL 112(L) 122(L) 134(L)       HGb at goal (9--11)   4) Secondary hyperparathyroidism -CKD Mineral-Bone Disorder    Lab Results  Component Value Date   CALCIUM 8.9 06/18/2020   CAION 0.76 (LL) 08/07/2019   PHOS 4.6 06/15/2020    Secondary Hyperparathyroidism present Intact PTH- 414pg/ml high   Phosphorus at goal.   5)COVID-19  6) Electrolytes   BMP Latest Ref Rng & Units 06/18/2020 06/15/2020 06/13/2020  Glucose 70 - 99 mg/dL 81 98 97  BUN 6 - 20 mg/dL 69(H) 43(H) 120(H)  Creatinine 0.61 - 1.24 mg/dL 10.60(H) 6.81(H) 14.55(H)  Sodium 135 - 145 mmol/L 141 139 145  Potassium 3.5 - 5.1 mmol/L 6.8(HH) 3.8 6.5(HH)  Chloride 98 - 111 mmol/L 104 100 109  CO2 22 - 32 mmol/L 23 27 15(L)  Calcium 8.9 - 10.3 mg/dL 8.9 7.7(L) 9.4     Sodium Normonatremic   Potassium Hyperkalemic Secondary to nonadherence to dialysis treatment and diet Pt was dialyzed on Friday and Saturday Patient is also on [facility]  Patient is not allowing new blood draws to look at the potassium  levels   7)Acid base  Co2 at goal     Plan:  I discussed with the patient to allow [place] to recheck his blood tests We will continue to educate      Larry Morrison s [facility] 06/20/2020, 12:21 PM

## 2020-06-20 NOTE — Plan of Care (Signed)
  Problem: Education: Goal: Knowledge of General Education information will improve Description: Including pain rating scale, medication(s)/side effects and non-pharmacologic comfort measures Outcome: Progressing   Problem: Health Behavior/Discharge Planning: Goal: Ability to manage health-related needs will improve Outcome: Progressing   Problem: Clinical Measurements: Goal: Ability to maintain clinical measurements within normal limits will improve Outcome: Progressing Goal: Will remain free from infection Outcome: Progressing Goal: Diagnostic test results will improve Outcome: Progressing Goal: Respiratory complications will improve Outcome: Progressing Goal: Cardiovascular complication will be avoided Outcome: Progressing   Problem: Activity: Goal: Risk for activity intolerance will decrease Outcome: Progressing   Problem: Nutrition: Goal: Adequate nutrition will be maintained Outcome: Progressing   Problem: Coping: Goal: Level of anxiety will decrease Outcome: Progressing   Problem: Elimination: Goal: Will not experience complications related to bowel motility Outcome: Progressing Goal: Will not experience complications related to urinary retention Outcome: Progressing   Problem: Pain Managment: Goal: General experience of comfort will improve Outcome: Progressing   Problem: Safety: Goal: Ability to remain free from injury will improve Outcome: Progressing   Problem: Skin Integrity: Goal: Risk for impaired skin integrity will decrease Outcome: Progressing   Problem: Respiratory: Goal: Will maintain a patent airway Outcome: Progressing Goal: Complications related to the disease process, condition or treatment will be avoided or minimized Outcome: Progressing

## 2020-06-21 LAB — PATHOLOGIST SMEAR REVIEW

## 2020-07-24 DIAGNOSIS — R197 Diarrhea, unspecified: Secondary | ICD-10-CM | POA: Insufficient documentation

## 2020-07-24 DIAGNOSIS — E873 Alkalosis: Secondary | ICD-10-CM | POA: Insufficient documentation

## 2020-07-24 DIAGNOSIS — Z8616 Personal history of COVID-19: Secondary | ICD-10-CM | POA: Insufficient documentation

## 2020-09-04 ENCOUNTER — Encounter: Payer: Self-pay | Admitting: Radiology

## 2020-09-04 ENCOUNTER — Inpatient Hospital Stay
Admission: EM | Admit: 2020-09-04 | Discharge: 2020-09-11 | DRG: 640 | Disposition: A | Discharge status: Home Health | Payer: Medicaid Other | Attending: Internal Medicine | Admitting: Internal Medicine

## 2020-09-04 ENCOUNTER — Emergency Department: Payer: Medicaid Other

## 2020-09-04 ENCOUNTER — Other Ambulatory Visit: Payer: Self-pay

## 2020-09-04 DIAGNOSIS — N200 Calculus of kidney: Secondary | ICD-10-CM | POA: Diagnosis present

## 2020-09-04 DIAGNOSIS — R64 Cachexia: Secondary | ICD-10-CM | POA: Diagnosis present

## 2020-09-04 DIAGNOSIS — Z515 Encounter for palliative care: Secondary | ICD-10-CM | POA: Diagnosis not present

## 2020-09-04 DIAGNOSIS — R918 Other nonspecific abnormal finding of lung field: Secondary | ICD-10-CM | POA: Diagnosis present

## 2020-09-04 DIAGNOSIS — R4189 Other symptoms and signs involving cognitive functions and awareness: Secondary | ICD-10-CM

## 2020-09-04 DIAGNOSIS — Z8616 Personal history of COVID-19: Secondary | ICD-10-CM

## 2020-09-04 DIAGNOSIS — X58XXXS Exposure to other specified factors, sequela: Secondary | ICD-10-CM | POA: Diagnosis present

## 2020-09-04 DIAGNOSIS — N186 End stage renal disease: Secondary | ICD-10-CM

## 2020-09-04 DIAGNOSIS — Z7189 Other specified counseling: Secondary | ICD-10-CM | POA: Diagnosis not present

## 2020-09-04 DIAGNOSIS — E875 Hyperkalemia: Secondary | ICD-10-CM | POA: Diagnosis present

## 2020-09-04 DIAGNOSIS — I120 Hypertensive chronic kidney disease with stage 5 chronic kidney disease or end stage renal disease: Secondary | ICD-10-CM | POA: Diagnosis present

## 2020-09-04 DIAGNOSIS — Z936 Other artificial openings of urinary tract status: Secondary | ICD-10-CM | POA: Diagnosis not present

## 2020-09-04 DIAGNOSIS — E872 Acidosis, unspecified: Secondary | ICD-10-CM

## 2020-09-04 DIAGNOSIS — Z20822 Contact with and (suspected) exposure to covid-19: Secondary | ICD-10-CM | POA: Diagnosis present

## 2020-09-04 DIAGNOSIS — N319 Neuromuscular dysfunction of bladder, unspecified: Secondary | ICD-10-CM | POA: Diagnosis present

## 2020-09-04 DIAGNOSIS — Z8673 Personal history of transient ischemic attack (TIA), and cerebral infarction without residual deficits: Secondary | ICD-10-CM | POA: Diagnosis not present

## 2020-09-04 DIAGNOSIS — Z9119 Patient's noncompliance with other medical treatment and regimen: Secondary | ICD-10-CM

## 2020-09-04 DIAGNOSIS — R9431 Abnormal electrocardiogram [ECG] [EKG]: Secondary | ICD-10-CM | POA: Diagnosis present

## 2020-09-04 DIAGNOSIS — Z436 Encounter for attention to other artificial openings of urinary tract: Secondary | ICD-10-CM

## 2020-09-04 DIAGNOSIS — Z992 Dependence on renal dialysis: Secondary | ICD-10-CM

## 2020-09-04 DIAGNOSIS — Z681 Body mass index (BMI) 19 or less, adult: Secondary | ICD-10-CM

## 2020-09-04 DIAGNOSIS — Z87820 Personal history of traumatic brain injury: Secondary | ICD-10-CM

## 2020-09-04 DIAGNOSIS — E8770 Fluid overload, unspecified: Principal | ICD-10-CM | POA: Diagnosis present

## 2020-09-04 DIAGNOSIS — D631 Anemia in chronic kidney disease: Secondary | ICD-10-CM | POA: Diagnosis present

## 2020-09-04 DIAGNOSIS — N2581 Secondary hyperparathyroidism of renal origin: Secondary | ICD-10-CM | POA: Diagnosis present

## 2020-09-04 DIAGNOSIS — G9341 Metabolic encephalopathy: Secondary | ICD-10-CM | POA: Diagnosis present

## 2020-09-04 DIAGNOSIS — Z905 Acquired absence of kidney: Secondary | ICD-10-CM

## 2020-09-04 DIAGNOSIS — S0990XS Unspecified injury of head, sequela: Secondary | ICD-10-CM

## 2020-09-04 DIAGNOSIS — Z9115 Patient's noncompliance with renal dialysis: Secondary | ICD-10-CM

## 2020-09-04 DIAGNOSIS — R4182 Altered mental status, unspecified: Secondary | ICD-10-CM

## 2020-09-04 DIAGNOSIS — Z862 Personal history of diseases of the blood and blood-forming organs and certain disorders involving the immune mechanism: Secondary | ICD-10-CM

## 2020-09-04 DIAGNOSIS — Z87891 Personal history of nicotine dependence: Secondary | ICD-10-CM

## 2020-09-04 DIAGNOSIS — R0682 Tachypnea, not elsewhere classified: Secondary | ICD-10-CM | POA: Diagnosis present

## 2020-09-04 DIAGNOSIS — E162 Hypoglycemia, unspecified: Secondary | ICD-10-CM | POA: Diagnosis present

## 2020-09-04 DIAGNOSIS — N19 Unspecified kidney failure: Secondary | ICD-10-CM | POA: Diagnosis present

## 2020-09-04 LAB — CBC WITH DIFFERENTIAL/PLATELET
Abs Immature Granulocytes: 0.08 10*3/uL — ABNORMAL HIGH (ref 0.00–0.07)
Basophils Absolute: 0 10*3/uL (ref 0.0–0.1)
Basophils Relative: 0 %
Eosinophils Absolute: 0 10*3/uL (ref 0.0–0.5)
Eosinophils Relative: 0 %
HCT: 39.4 % (ref 39.0–52.0)
Hemoglobin: 12 g/dL — ABNORMAL LOW (ref 13.0–17.0)
Immature Granulocytes: 1 %
Lymphocytes Relative: 3 %
Lymphs Abs: 0.3 10*3/uL — ABNORMAL LOW (ref 0.7–4.0)
MCH: 31.3 pg (ref 26.0–34.0)
MCHC: 30.5 g/dL (ref 30.0–36.0)
MCV: 102.6 fL — ABNORMAL HIGH (ref 80.0–100.0)
Monocytes Absolute: 0.3 10*3/uL (ref 0.1–1.0)
Monocytes Relative: 3 %
Neutro Abs: 9.5 10*3/uL — ABNORMAL HIGH (ref 1.7–7.7)
Neutrophils Relative %: 93 %
Platelets: 168 10*3/uL (ref 150–400)
RBC: 3.84 MIL/uL — ABNORMAL LOW (ref 4.22–5.81)
RDW: 18.9 % — ABNORMAL HIGH (ref 11.5–15.5)
WBC: 10.1 10*3/uL (ref 4.0–10.5)
nRBC: 1.5 % — ABNORMAL HIGH (ref 0.0–0.2)

## 2020-09-04 LAB — LACTIC ACID, PLASMA
Lactic Acid, Venous: 10.1 mmol/L (ref 0.5–1.9)
Lactic Acid, Venous: 3.1 mmol/L (ref 0.5–1.9)

## 2020-09-04 LAB — COMPREHENSIVE METABOLIC PANEL
ALT: 49 U/L — ABNORMAL HIGH (ref 0–44)
AST: 38 U/L (ref 15–41)
Albumin: 3.5 g/dL (ref 3.5–5.0)
Alkaline Phosphatase: 64 U/L (ref 38–126)
Anion gap: 31 — ABNORMAL HIGH (ref 5–15)
BUN: 150 mg/dL — ABNORMAL HIGH (ref 6–20)
CO2: 7 mmol/L — ABNORMAL LOW (ref 22–32)
Calcium: 9.3 mg/dL (ref 8.9–10.3)
Chloride: 107 mmol/L (ref 98–111)
Creatinine, Ser: 13.8 mg/dL — ABNORMAL HIGH (ref 0.61–1.24)
GFR, Estimated: 4 mL/min — ABNORMAL LOW (ref >60)
Glucose, Bld: 38 mg/dL — CL (ref 70–99)
Potassium: 7.2 mmol/L (ref 3.5–5.1)
Sodium: 145 mmol/L (ref 135–145)
Total Bilirubin: 1.8 mg/dL — ABNORMAL HIGH (ref 0.3–1.2)
Total Protein: 8.6 g/dL — ABNORMAL HIGH (ref 6.5–8.1)

## 2020-09-04 LAB — CBG MONITORING, ED
Glucose-Capillary: 33 mg/dL — CL (ref 70–99)
Glucose-Capillary: 87 mg/dL (ref 70–99)
Glucose-Capillary: 110 mg/dL — ABNORMAL HIGH (ref 70–99)
Glucose-Capillary: 123 mg/dL — ABNORMAL HIGH (ref 70–99)

## 2020-09-04 LAB — RESP PANEL BY RT-PCR (FLU A&B, COVID) ARPGX2
Influenza A by PCR: NEGATIVE
Influenza B by PCR: NEGATIVE
SARS Coronavirus 2 by RT PCR: NEGATIVE

## 2020-09-04 MED ORDER — DEXTROSE 50 % IV SOLN
1.0000 | Freq: Once | INTRAVENOUS | Status: AC
  Administered 2020-09-04: 50 mL via INTRAVENOUS
  Filled 2020-09-04: qty 50

## 2020-09-04 MED ORDER — ACETAMINOPHEN 650 MG RE SUPP
650.0000 mg | Freq: Four times a day (QID) | RECTAL | Status: DC | PRN
Start: 2020-09-04 — End: 2020-09-12

## 2020-09-04 MED ORDER — ACETAMINOPHEN 325 MG PO TABS: 650.0000 mg | ORAL_TABLET | Freq: Four times a day (QID) | ORAL | Status: DC | PRN

## 2020-09-04 MED ORDER — CEFTRIAXONE SODIUM 2 G IJ SOLR
2.0000 g | INTRAMUSCULAR | Status: DC
  Administered 2020-09-06: 2 g via INTRAVENOUS
  Filled 2020-09-04: qty 20

## 2020-09-04 MED ORDER — ONDANSETRON HCL 4 MG PO TABS: 4.0000 mg | ORAL_TABLET | Freq: Four times a day (QID) | ORAL | Status: DC | PRN

## 2020-09-04 MED ORDER — INSULIN ASPART 100 UNIT/ML ~~LOC~~ SOLN
10.0000 [IU] | Freq: Once | SUBCUTANEOUS | Status: AC
  Administered 2020-09-04: 10 [IU] via INTRAVENOUS
  Filled 2020-09-04: qty 1

## 2020-09-04 MED ORDER — PATIROMER SORBITEX CALCIUM 8.4 G PO PACK
16.8000 g | PACK | Freq: Every day | ORAL | Status: DC
  Administered 2020-09-05: 16.8 g via ORAL
  Filled 2020-09-04 (×2): qty 2

## 2020-09-04 MED ORDER — SODIUM CHLORIDE 0.9 % IV SOLN
500.0000 mg | INTRAVENOUS | Status: DC
  Administered 2020-09-06: 500 mg via INTRAVENOUS
  Filled 2020-09-04: qty 500

## 2020-09-04 MED ORDER — SODIUM CHLORIDE 0.9 % IV SOLN
500.0000 mg | Freq: Once | INTRAVENOUS | Status: AC
  Administered 2020-09-04: 500 mg via INTRAVENOUS
  Filled 2020-09-04: qty 500

## 2020-09-04 MED ORDER — SODIUM CHLORIDE 0.9 % IV SOLN
2.0000 g | Freq: Once | INTRAVENOUS | Status: AC
  Administered 2020-09-04: 2 g via INTRAVENOUS
  Filled 2020-09-04: qty 2

## 2020-09-04 MED ORDER — DEXTROSE 50 % IV SOLN: 50.0000 mL | Freq: Once | INTRAVENOUS | Status: AC

## 2020-09-04 MED ORDER — HEPARIN SODIUM (PORCINE) 5000 UNIT/ML IJ SOLN
5000.0000 [IU] | Freq: Three times a day (TID) | INTRAMUSCULAR | Status: DC
  Administered 2020-09-07: 5000 [IU] via SUBCUTANEOUS
  Filled 2020-09-04 (×4): qty 1

## 2020-09-04 MED ORDER — LACTATED RINGERS IV BOLUS
1000.0000 mL | Freq: Once | INTRAVENOUS | Status: AC
  Administered 2020-09-04: 1000 mL via INTRAVENOUS

## 2020-09-04 MED ORDER — METRONIDAZOLE IN NACL 5-0.79 MG/ML-% IV SOLN
500.0000 mg | Freq: Once | INTRAVENOUS | Status: AC
  Administered 2020-09-04: 500 mg via INTRAVENOUS
  Filled 2020-09-04: qty 100

## 2020-09-04 MED ORDER — LACTATED RINGERS IV SOLN: INTRAVENOUS | Status: DC

## 2020-09-04 MED ORDER — DEXTROSE 50 % IV SOLN
INTRAVENOUS | Status: AC
  Administered 2020-09-04: 50 mL via INTRAVENOUS
  Filled 2020-09-04: qty 50

## 2020-09-04 MED ORDER — ALBUTEROL SULFATE (2.5 MG/3ML) 0.083% IN NEBU: 2.5000 mg | INHALATION_SOLUTION | Freq: Once | RESPIRATORY_TRACT | Status: DC

## 2020-09-04 MED ORDER — CALCIUM GLUCONATE-NACL 1-0.675 GM/50ML-% IV SOLN
1.0000 g | Freq: Once | INTRAVENOUS | Status: AC
  Administered 2020-09-04: 1000 mg via INTRAVENOUS
  Filled 2020-09-04: qty 50

## 2020-09-04 MED ORDER — ONDANSETRON HCL 4 MG/2ML IJ SOLN: 4.0000 mg | Freq: Four times a day (QID) | INTRAMUSCULAR | Status: DC | PRN

## 2020-09-04 MED ORDER — SODIUM CHLORIDE 0.9% FLUSH
3.0000 mL | Freq: Two times a day (BID) | INTRAVENOUS | Status: DC
  Administered 2020-09-05 – 2020-09-09 (×3): 3 mL via INTRAVENOUS

## 2020-09-04 MED ORDER — SENNOSIDES-DOCUSATE SODIUM 8.6-50 MG PO TABS: 1.0000 | ORAL_TABLET | Freq: Every evening | ORAL | Status: DC | PRN

## 2020-09-04 MED ORDER — SODIUM BICARBONATE 8.4 % IV SOLN
INTRAVENOUS | Status: DC
  Filled 2020-09-04: qty 1000
  Filled 2020-09-04: qty 150
  Filled 2020-09-04 (×7): qty 1000

## 2020-09-04 NOTE — H&P (Signed)
History and Physical    Larry Morrison EPP:[id number] DOB: 1970-11-28 DOA: 09/04/2020  PCP: [facility] [place]  Patient coming from: Home via EMS  I have personally briefly reviewed patient's old medical records in [place]  Chief Complaint: Altered mental status  HPI: Larry Morrison is a 50 y.o. male with medical history significant for ESRD on HD, history of CVA, history of brain aneurysm, hypertension, chronic indwelling right nephrostomy, nephrolithiasis, neurogenic bladder, and nonadherence who presents to the ED via EMS for evaluation of altered mental status.  History is limited from patient due to cooperation and is otherwise obtained from [place] and chart review.  Patient lives alone.  Family went to check in on him and found him to be altered.  He has apparently not been to dialysis in 2-3 weeks.  EMS were called and on their arrival he was noted to be hypoglycemic.  He was given oral glucose and was brought to the ED for further evaluation.  Patient tells me he has not been to dialysis for a while but cannot give me a good reason.  He says he still makes urine.  He says he has pain at his right flank.  He says he feels cold.  He says he does want to be full code.  He is otherwise not willing to answer further questions or participate further in exam.  ED Course:  Initial vitals showed BP 175/79, pulse 99, RR 40, temp 96.9 F, SPO2 100% on room air.  While in the ED, patient remained tachypneic with RR fluctuating 25-40.  SPO2 dropped to 87% and he was placed on 2 L of home O2 via .  Labs significant for potassium 7.2, bicarb 7, BUN 150, creatinine 13.8, serum glucose 38, sodium 145, AST 38, ALT 49, alk phos 64, total bilirubin 1.8, lactic acid 10.1, WBC 10.1, hemoglobin 12.0, platelets 168,000.  Blood cultures obtained and pending.  SARS-CoV-2 PCR panel obtained and pending.  Portable chest x-ray showed bilateral lower lobe opacities, left greater than right with probable  small effusions.  Patient was given 1 L LR and D50.  Repeat CBGs trended up to 123.  Repeat lactic acid improved to 3.1.  EDP discussed with on-call nephrology who recommended temporizing measures with plans for dialysis in the morning.  Patient was given empiric IV azithromycin, cefepime, metronidazole.  He was given 10 units IV NovoLog with D50, 1 g IV calcium gluconate, IV sodium bicarb, Veltassa, albuterol nebulizer treatment.  EDP discussed with on-call critical care who felt patient is stable for hospitalist admission and they may consult if needed.  The hospitalist service was consulted to admit for further evaluation and management.  Review of Systems:  Unable to obtain full review of systems due to cooperation.   Past Medical History:  Diagnosis Date  . Anemia   . Aneurysm ([place])    brain at age 63  . Dialysis patient ([place])   . Dyspnea   . Foot drop   . History of kidney stones   . History of nephrostomy   . Hypertension   . Renal disorder   . Stroke [facility])     Past Surgical History:  Procedure Laterality Date  . AV FISTULA PLACEMENT Right 08/07/2019   Procedure: ARTERIOVENOUS (AV) FISTULA CREATION;  Surgeon: Algernon Huxley, MD;  Location: [facility] ORS;  Service: Vascular;  Laterality: Right;  . AV FISTULA PLACEMENT Right 11/20/2019   Procedure: INSERTION OF ARTERIOVENOUS (AV) GORE-TEX GRAFT ARM;  Surgeon: Algernon Huxley,  MD;  Location: [facility] ORS;  Service: Vascular;  Laterality: Right;  . NEPHRECTOMY Left   . NEPHRECTOMY    . ORCHIECTOMY Right 10/27/2016   Procedure: PSB ORCHIECTOMY;  Surgeon: Hollice Espy, MD;  Location: [facility] ORS;  Service: Urology;  Laterality: Right;  . ORCHIOPEXY Bilateral 10/27/2016   Procedure: ORCHIOPEXY ADULT;  Surgeon: Hollice Espy, MD;  Location: [facility] ORS;  Service: Urology;  Laterality: Bilateral;  . SCROTAL EXPLORATION Bilateral 10/27/2016   Procedure: SCROTUM EXPLORATION;  Surgeon: Hollice Espy, MD;  Location: [facility] ORS;  Service: Urology;   Laterality: Bilateral;    Social History:  reports that he has quit smoking. His smoking use included cigarettes. He smoked 0.50 packs per day. He has never used smokeless tobacco. He reports current drug use. Drug: Marijuana. He reports that he does not drink alcohol.  Allergies  Allergen Reactions  . Vancomycin     Patient denies    Family History  Family history unknown: Yes     Prior to Admission medications   Not on File    Physical Exam: Vitals:   09/04/20 2100 09/04/20 2130 09/04/20 2200 09/04/20 2230  BP: (!) 144/118 (!) 162/103 (!) 132/95 100/83  Pulse: 90     Resp: (!) 30 (!) 34 (!) 27 (!) 47  Temp:      TempSrc:      SpO2: 94%   100%  Weight:      Height:       Exam limited due to cooperation Constitutional: Chronically ill-appearing disheveled cachectic man resting in bed in the left lateral decubitus position Eyes: lids and conjunctivae normal ENMT: Mucous membranes are dry.  Poor dentition  Neck: normal, supple Respiratory: clear to auscultation anteriorly, unable to auscultate on back due to poor patient cooperation.  Normal respiratory effort. No accessory muscle use.  Cardiovascular: Regular rate and rhythm, no murmurs / rubs / gallops. No extremity edema. 2+ pedal pulses.  aVF RUE. Abdomen: tenderness, no masses palpated. Bowel sounds positive. GU: Has right nephrostomy, unable to visualize insertion site as patient did not allow me to look at it.  Per ED nursing documentation there were maggots around the site.  No output in collecting back Musculoskeletal: no clubbing / cyanosis.  Thin extremities, patient is not moving his extremities, appears to have contractures in all extremities. Skin: Dry flaky skin, unkept toenails, unable to visualize nephrostomy insertion site Neurologic: CN 2-12 grossly intact. Sensation intact.  Unable to assess strength due to cooperation Psychiatric: Awake, alert, and oriented to self and place.  Mood is abrasive, affect  is flat.  Labs on Admission: I have personally reviewed following labs and imaging studies  CBC: Recent Labs  Lab 09/04/20 1950  WBC 10.1  NEUTROABS 9.5*  HGB 12.0*  HCT 39.4  MCV 102.6*  PLT 831   Basic Metabolic Panel: Recent Labs  Lab 09/04/20 1950  NA 145  K 7.2*  CL 107  CO2 7*  GLUCOSE 38*  BUN 150*  CREATININE 13.80*  CALCIUM 9.3   GFR: Estimated Creatinine Clearance: 4.8 mL/min (A) (by C-G formula based on SCr of 13.8 mg/dL (H)). Liver Function Tests: Recent Labs  Lab 09/04/20 1950  AST 38  ALT 49*  ALKPHOS 64  BILITOT 1.8*  PROT 8.6*  ALBUMIN 3.5   No results for input(s): LIPASE, AMYLASE in the last 168 hours. No results for input(s): AMMONIA in the last 168 hours. Coagulation Profile: No results for input(s): INR, PROTIME in the last 168 hours. Cardiac Enzymes:  No results for input(s): CKTOTAL, CKMB, CKMBINDEX, TROPONINI in the last 168 hours. BNP (last 3 results) No results for input(s): PROBNP in the last 8760 hours. HbA1C: No results for input(s): HGBA1C in the last 72 hours. CBG: Recent Labs  Lab 09/04/20 1949 09/04/20 2010 09/04/20 2144 09/04/20 2306  GLUCAP 33* 87 110* 123*   Lipid Profile: No results for input(s): CHOL, HDL, LDLCALC, TRIG, CHOLHDL, LDLDIRECT in the last 72 hours. Thyroid Function Tests: No results for input(s): TSH, T4TOTAL, FREET4, T3FREE, THYROIDAB in the last 72 hours. Anemia Panel: No results for input(s): VITAMINB12, FOLATE, FERRITIN, TIBC, IRON, RETICCTPCT in the last 72 hours. Urine analysis:    Component Value Date/Time   COLORURINE AMBER (A) 04/28/2017 1032   APPEARANCEUR TURBID (A) 04/28/2017 1032   APPEARANCEUR Turbid 09/19/2014 1914   LABSPEC 1.011 04/28/2017 1032   LABSPEC 1.011 09/19/2014 1914   PHURINE 7.0 04/28/2017 1032   GLUCOSEU NEGATIVE 04/28/2017 1032   GLUCOSEU Negative 09/19/2014 1914   HGBUR MODERATE (A) 04/28/2017 1032   BILIRUBINUR NEGATIVE 04/28/2017 1032   BILIRUBINUR Negative  09/19/2014 1914   KETONESUR NEGATIVE 04/28/2017 1032   PROTEINUR 100 (A) 04/28/2017 1032   NITRITE POSITIVE (A) 04/28/2017 1032   LEUKOCYTESUR MODERATE (A) 04/28/2017 1032   LEUKOCYTESUR 2+ 09/19/2014 1914    Radiological Exams on Admission: DG Chest Port 1 View  Result Date: 09/04/2020 CLINICAL DATA:  Questionable sepsis EXAM: PORTABLE CHEST 1 VIEW COMPARISON:  06/18/2020 FINDINGS: Cardiomegaly. Bilateral lower lobe airspace opacities, left greater than right. Suspect layering effusions. Findings concerning for pneumonia. No acute bony abnormality. IMPRESSION: Bilateral lower lobe opacities, left greater than right with probable small effusions. Findings concerning for pneumonia. Electronically Signed   By: Rolm Baptise M.D.   On: 09/04/2020 20:38    EKG: Personally reviewed. Sinus rhythm, low voltage extremity leads, QTC 537, rate 99, no significant peaked T wave changes.  Rate is faster and QTC is more prolonged when compared to prior.  Assessment/Plan Principal Problem:   ESRD needing dialysis ([place]) Active Problems:   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[place])   Neurogenic bladder   Hyperkalemia   Hypoglycemia   Uremia  Larry Morrison is a 50 y.o. male with medical history significant for ESRD on HD, history of CVA, history of brain aneurysm, hypertension, chronic indwelling right nephrostomy, nephrolithiasis, neurogenic bladder, and nonadherence who is admitted with hyperkalemia, uremia and dialysis needs.  ESRD with hyperkalemia and uremia due to missed dialysis: EDP discussed with on-call nephrology, Dr. Juleen China who plans to dialyze in the morning.  Further conversation patient last dialyzed 3 weeks ago.  Temporizing measures were initiated in the ED for hyperkalemia. -S/p NovoLog with D50, IV calcium gluconate, IV sodium bicarb -Veltassa and albuterol nebulizer treatments ordered and pending administration -Further management with dialysis per nephrology -Keep in stepdown unit until patient is  able to be dialyzed  Bilateral lower lobe lung opacities: Seen on CXR.  Suspect effusions related to missed dialysis however given clinical presentation pneumonia also possible. -Continue empiric IV ceftriaxone and azithromycin, de-escalate as able -Follow blood cultures -Supplemental oxygen if needed  Hypoglycemia: Present on arrival, now improved after receiving D50.  Continue to monitor.  Chronic indwelling right nephrostomy tube Hx nephrolithiasis and neurogenic bladder: Patient states he still makes urine.  Continue nephrostomy care.  DVT prophylaxis: Subcutaneous heparin Code Status: Full code Family Communication: Discussed with patient Disposition Plan: From home where he lives alone, dispo pending clinical progress Consults called: Nephrology Level of care: Stepdown Admission status:  Status is: Inpatient  Remains inpatient appropriate because:Persistent severe electrolyte disturbances, IV treatments appropriate due to intensity of illness or inability to take PO and Inpatient level of care appropriate due to severity of illness   Dispo: The patient is from: Home              Anticipated d/c is to: Home              Patient currently is not medically stable to d/c.   Zada Finders MD [facility]  If 7PM-7AM, please contact night-coverage [email or web address]  09/04/2020, 11:36 PM

## 2020-09-04 NOTE — H&P (Incomplete)
History and Physical    Larry Morrison YYT:[id number] DOB: 03-09-1971 DOA: 09/04/2020  PCP: [facility] [place]  Patient coming from: Home via EMS  I have personally briefly reviewed patient's old medical records in [place]  Chief Complaint: Altered mental status  HPI: Larry Morrison is a 50 y.o. male with medical history significant for ESRD on HD, history of CVA, history of brain aneurysm, hypertension, chronic indwelling right nephrostomy, nephrolithiasis, neurogenic bladder, and nonadherence who presents to the ED via EMS for evaluation of altered mental status.  History is limited from patient due to cooperation and is otherwise obtained from [place] and chart review.    ED Course:  Initial vitals showed BP 175/79, pulse 99, RR 40, temp 96.9 F, SPO2 100% on room air.  While in the ED, patient remained tachypneic with RR fluctuating 25-40.  SPO2 dropped to 87% and he was placed on 2 L of home O2 via [place].  Labs significant for potassium 7.2, bicarb 7, BUN 150, creatinine 13.8, serum glucose 38, sodium 145, AST 38, ALT 49, alk phos 64, total bilirubin 1.8, lactic acid 10.1, WBC 10.1, hemoglobin 12.0, platelets 168,000.  Blood cultures obtained and pending.  SARS-CoV-2 PCR panel obtained and pending.  Portable chest x-ray showed bilateral lower lobe opacities, left greater than right with probable small effusions.  Patient was given 1 L LR and D50.  Repeat CBGs trended up to 123.  Repeat lactic acid improved to 3.1.  EDP discussed with on-call nephrology who recommended temporizing measures with plans for dialysis in the morning.  Patient was given empiric IV azithromycin, cefepime, metronidazole.  He was given 10 units IV NovoLog with D50, 1 g IV calcium gluconate, IV sodium bicarb, Veltassa, albuterol nebulizer treatment.  EDP discussed with on-call critical care who felt patient is stable for hospitalist admission and they may consult if needed.  The hospitalist service was  consulted to admit for further evaluation and management.  Review of Systems:  Unable to obtain full review of systems due to cooperation.   Past Medical History:  Diagnosis Date  . Anemia   . Aneurysm ([place])    brain at age 32  . Dialysis patient ([place])   . Dyspnea   . Foot drop   . History of kidney stones   . History of nephrostomy   . Hypertension   . Renal disorder   . Stroke [facility])     Past Surgical History:  Procedure Laterality Date  . AV FISTULA PLACEMENT Right 08/07/2019   Procedure: ARTERIOVENOUS (AV) FISTULA CREATION;  Surgeon: Algernon Huxley, MD;  Location: [facility] ORS;  Service: Vascular;  Laterality: Right;  . AV FISTULA PLACEMENT Right 11/20/2019   Procedure: INSERTION OF ARTERIOVENOUS (AV) GORE-TEX GRAFT ARM;  Surgeon: Algernon Huxley, MD;  Location: [facility] ORS;  Service: Vascular;  Laterality: Right;  . NEPHRECTOMY Left   . NEPHRECTOMY    . ORCHIECTOMY Right 10/27/2016   Procedure: PSB ORCHIECTOMY;  Surgeon: Hollice Espy, MD;  Location: [facility] ORS;  Service: Urology;  Laterality: Right;  . ORCHIOPEXY Bilateral 10/27/2016   Procedure: ORCHIOPEXY ADULT;  Surgeon: Hollice Espy, MD;  Location: [facility] ORS;  Service: Urology;  Laterality: Bilateral;  . SCROTAL EXPLORATION Bilateral 10/27/2016   Procedure: SCROTUM EXPLORATION;  Surgeon: Hollice Espy, MD;  Location: [facility] ORS;  Service: Urology;  Laterality: Bilateral;    Social History:  reports that he has quit smoking. His smoking use included cigarettes. He smoked 0.50 packs per day. He has never used  smokeless tobacco. He reports current drug use. Drug: Marijuana. He reports that he does not drink alcohol.  Allergies  Allergen Reactions  . Vancomycin     Patient denies    Family History  Family history unknown: Yes     Prior to Admission medications   Not on File    Physical Exam: Vitals:   09/04/20 2100 09/04/20 2130 09/04/20 2200 09/04/20 2230  BP: (!) 144/118 (!) 162/103 (!) 132/95 100/83  Pulse: 90      Resp: (!) 30 (!) 34 (!) 27 (!) 47  Temp:      TempSrc:      SpO2: 94%   100%  Weight:      Height:       Exam limited due to cooperation Constitutional: Chronically ill-appearing disheveled cachectic man resting in bed in the left lateral decubitus position Eyes: lids and conjunctivae normal ENMT: Mucous membranes are dry.  Poor dentition  Neck: normal, supple Respiratory: clear to auscultation anteriorly, unable to auscultate on back due to poor patient cooperation.  Normal respiratory effort. No accessory muscle use.  Cardiovascular: Regular rate and rhythm, no murmurs / rubs / gallops. No extremity edema. 2+ pedal pulses.  aVF RUE. Abdomen: tenderness, no masses palpated. Bowel sounds positive. GU: Has right nephrostomy, unable to visualize insertion site as patient did not allow me to look at it.  Per ED nursing documentation there were maggots around the site.  No output in collecting back Musculoskeletal: no clubbing / cyanosis.  Thin extremities, patient is not moving his extremities, appears to have contractures in all extremities. Skin: Dry flaky skin, unkept toenails, unable to visualize nephrostomy insertion site Neurologic: CN 2-12 grossly intact. Sensation intact.  Unable to assess strength due to cooperation Psychiatric: Awake, alert, and oriented to self and place.  Mood is abrasive, affect is flat.  Labs on Admission: I have personally reviewed following labs and imaging studies  CBC: Recent Labs  Lab 09/04/20 1950  WBC 10.1  NEUTROABS 9.5*  HGB 12.0*  HCT 39.4  MCV 102.6*  PLT 654   Basic Metabolic Panel: Recent Labs  Lab 09/04/20 1950  NA 145  K 7.2*  CL 107  CO2 7*  GLUCOSE 38*  BUN 150*  CREATININE 13.80*  CALCIUM 9.3   GFR: Estimated Creatinine Clearance: 4.8 mL/min (A) (by C-G formula based on SCr of 13.8 mg/dL (H)). Liver Function Tests: Recent Labs  Lab 09/04/20 1950  AST 38  ALT 49*  ALKPHOS 64  BILITOT 1.8*  PROT 8.6*  ALBUMIN 3.5    No results for input(s): LIPASE, AMYLASE in the last 168 hours. No results for input(s): AMMONIA in the last 168 hours. Coagulation Profile: No results for input(s): INR, PROTIME in the last 168 hours. Cardiac Enzymes: No results for input(s): CKTOTAL, CKMB, CKMBINDEX, TROPONINI in the last 168 hours. BNP (last 3 results) No results for input(s): PROBNP in the last 8760 hours. HbA1C: No results for input(s): HGBA1C in the last 72 hours. CBG: Recent Labs  Lab 09/04/20 1949 09/04/20 2010 09/04/20 2144 09/04/20 2306  GLUCAP 33* 87 110* 123*   Lipid Profile: No results for input(s): CHOL, HDL, LDLCALC, TRIG, CHOLHDL, LDLDIRECT in the last 72 hours. Thyroid Function Tests: No results for input(s): TSH, T4TOTAL, FREET4, T3FREE, THYROIDAB in the last 72 hours. Anemia Panel: No results for input(s): VITAMINB12, FOLATE, FERRITIN, TIBC, IRON, RETICCTPCT in the last 72 hours. Urine analysis:    Component Value Date/Time   COLORURINE AMBER (A) 04/28/2017  1032   APPEARANCEUR TURBID (A) 04/28/2017 1032   APPEARANCEUR Turbid 09/19/2014 1914   LABSPEC 1.011 04/28/2017 1032   LABSPEC 1.011 09/19/2014 1914   PHURINE 7.0 04/28/2017 1032   GLUCOSEU NEGATIVE 04/28/2017 1032   GLUCOSEU Negative 09/19/2014 1914   HGBUR MODERATE (A) 04/28/2017 1032   BILIRUBINUR NEGATIVE 04/28/2017 1032   BILIRUBINUR Negative 09/19/2014 1914   KETONESUR NEGATIVE 04/28/2017 1032   PROTEINUR 100 (A) 04/28/2017 1032   NITRITE POSITIVE (A) 04/28/2017 1032   LEUKOCYTESUR MODERATE (A) 04/28/2017 1032   LEUKOCYTESUR 2+ 09/19/2014 1914    Radiological Exams on Admission: DG Chest Port 1 View  Result Date: 09/04/2020 CLINICAL DATA:  Questionable sepsis EXAM: PORTABLE CHEST 1 VIEW COMPARISON:  06/18/2020 FINDINGS: Cardiomegaly. Bilateral lower lobe airspace opacities, left greater than right. Suspect layering effusions. Findings concerning for pneumonia. No acute bony abnormality. IMPRESSION: Bilateral lower lobe  opacities, left greater than right with probable small effusions. Findings concerning for pneumonia. Electronically Signed   By: Rolm Baptise M.D.   On: 09/04/2020 20:38    EKG: Personally reviewed. Sinus rhythm, low voltage extremity leads, QTC 537, rate 99, no significant peaked T wave changes.  Rate is faster and QTC is more prolonged when compared to prior.  Assessment/Plan Principal Problem:   ESRD needing dialysis ([place]) Active Problems:   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[place])   Neurogenic bladder   Hyperkalemia   Hypoglycemia   Uremia  Bobie Kistler is a 50 y.o. male with medical history significant for ESRD on HD, history of CVA, history of brain aneurysm, hypertension, chronic indwelling right nephrostomy, nephrolithiasis, neurogenic bladder, and nonadherence who is admitted with hyperkalemia, uremia and dialysis needs.  ESRD with hyperkalemia and uremia due to missed dialysis: EDP discussed with on-call nephrology, Dr. Juleen China who plans to dialyze in the morning.  Further conversation patient last dialyzed 3 weeks ago.  Temporizing measures were initiated in the ED for hyperkalemia. -S/p NovoLog with D50, IV calcium gluconate, IV sodium bicarb -Veltassa and albuterol nebulizer treatments ordered and pending administration -Further management with dialysis per nephrology -Keep in stepdown unit until patient is able to be dialyzed  Bilateral lower lobe lung opacities: Seen on CXR.  Suspect effusions related to missed dialysis however given clinical presentation pneumonia also possible. -Continue empiric IV ceftriaxone and azithromycin, de-escalate as able -Follow blood cultures -Supplemental oxygen if needed  Hypoglycemia: Present on arrival, now improved after receiving D50.  Continue to monitor.  Chronic indwelling right nephrostomy tube Hx nephrolithiasis and neurogenic bladder: Patient states he still makes urine.  Continue nephrostomy care.  DVT prophylaxis: Subcutaneous  heparin Code Status: Full code Family Communication: Discussed with patient Disposition Plan: From home where he lives alone, dispo pending clinical progress Consults called: Nephrology Level of care: Stepdown Admission status:  Status is: Inpatient  Remains inpatient appropriate because:Persistent severe electrolyte disturbances, IV treatments appropriate due to intensity of illness or inability to take PO and Inpatient level of care appropriate due to severity of illness   Dispo: The patient is from: Home              Anticipated d/c is to: Home              Patient currently is not medically stable to d/c.   Zada Finders MD [facility]  If 7PM-7AM, please contact night-coverage [email or web address]  09/04/2020, 11:36 PM

## 2020-09-04 NOTE — Progress Notes (Signed)
Pt being followed by [facility] for sepsis protocol. 

## 2020-09-04 NOTE — Consult Note (Signed)
CODE SEPSIS - PHARMACY COMMUNICATION  **Broad Spectrum Antibiotics should be administered within 1 hour of Sepsis diagnosis**  Time Code Sepsis Called/Page Received: 2055  Antibiotics Ordered: 2053  Time of 1st antibiotic administration: 2100  Additional action taken by pharmacy: N/A  If necessary, Name of Provider/Nurse Contacted: N/A    Darnelle Bos ,PharmD Clinical Pharmacist  09/04/2020  8:55 PM

## 2020-09-04 NOTE — ED Provider Notes (Signed)
[facility] Emergency Department Provider Note  ____________________________________________   I have reviewed the triage vital signs and the nursing notes.   HISTORY  Chief Complaint AMS  History limited by: Altered Mental Status   HPI Larry Morrison is a 50 y.o. male who presents to the emergency department today via EMS after family checked on patient and found him altered. Apparently the patient lives by himself. He is a dialysis patient but has not been in a couple of weeks. When EMS arrived the patient was found to have low blood sugar. They did give some oral glucose. The patient himself cannot give a good history as to what has been going on over the past couple of weeks.   Records reviewed. Per medical record review patient has a history of ESRD on dialysis  Past Medical History:  Diagnosis Date  . Anemia   . Aneurysm ([place])    brain at age 34  . Dialysis patient ([place])   . Dyspnea   . Foot drop   . History of kidney stones   . History of nephrostomy   . Hypertension   . Renal disorder   . Stroke [facility])     Patient Active Problem List   Diagnosis Date Noted  . COVID-19 virus infection 06/18/2020  . Stroke ([place]) 06/18/2020  . Thrombocytopenia ([place]) 06/18/2020  . Elevated troponin 06/18/2020  . Hyperkalemia 06/13/2020  . Anemia in ESRD (end-stage renal disease) ([place]) 09/17/2019  . Essential hypertension 07/15/2019  . Testicular torsion   . Pyuria 06/10/2016  . Chronic pain following surgery or procedure 12/01/2015  . History of nephrostomy 12/01/2015  . Tobacco use disorder 12/01/2015  . ESRD (end stage renal disease) ([place]) 04/28/2014  . Obstructed nephrostomy tube ([place]) 10/23/2013  . 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 Neurogenic bladder 02/09/1998  . Urinary calculus 02/09/1998  . 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 AV  FISTULA PLACEMENT Right 08/07/2019   Procedure: ARTERIOVENOUS (AV) FISTULA CREATION;  Surgeon: Algernon Huxley, MD;  Location: [facility] ORS;  Service: Vascular;  Laterality: Right;  . AV FISTULA PLACEMENT Right 11/20/2019   Procedure: INSERTION OF ARTERIOVENOUS (AV) GORE-TEX GRAFT ARM;  Surgeon: Algernon Huxley, MD;  Location: [facility] ORS;  Service: Vascular;  Laterality: Right;  . NEPHRECTOMY Left   . NEPHRECTOMY    . ORCHIECTOMY Right 10/27/2016   Procedure: PSB ORCHIECTOMY;  Surgeon: Hollice Espy, MD;  Location: [facility] ORS;  Service: Urology;  Laterality: Right;  . ORCHIOPEXY Bilateral 10/27/2016   Procedure: ORCHIOPEXY ADULT;  Surgeon: Hollice Espy, MD;  Location: [facility] ORS;  Service: Urology;  Laterality: Bilateral;  . SCROTAL EXPLORATION Bilateral 10/27/2016   Procedure: SCROTUM EXPLORATION;  Surgeon: Hollice Espy, MD;  Location: [facility] ORS;  Service: Urology;  Laterality: Bilateral;    Prior to Admission medications   Not on File    Allergies Vancomycin  Family History  Family history unknown: Yes    Social History Social History   Tobacco Use  . Smoking status: Former Smoker    Packs/day: 0.50    Types: Cigarettes  . Smokeless tobacco: Never Used  Vaping Use  . Vaping Use: Never used  Substance Use Topics  . Alcohol use: No  . Drug use: Yes    Types: Marijuana    Comment: today    Review of Systems Unable to obtain reliable ROS secondary to AMS.  ____________________________________________  PHYSICAL EXAM:  VITAL SIGNS: ED Triage Vitals [09/04/20 1922]  Enc Vitals Group     BP (!) 175/79     Pulse Rate 97     Resp (!) 40     Temp (!) 96.9 F (36.1 C)     Temp Source Axillary    Constitutional: Awake and alert. Not completely oriented. Unkempt. Eyes: Conjunctivae are normal.  ENT      Head: Normocephalic and atraumatic.      Nose: No congestion/rhinnorhea.      Mouth/Throat: Mucous membranes are moist.      Neck: No  stridor. Hematological/Lymphatic/Immunilogical: No cervical lymphadenopathy. Cardiovascular: Normal rate, regular rhythm.  No murmurs, rubs, or gallops.  Respiratory: Tachypnea. Slightly increased respiratory effort.  Gastrointestinal: Soft and non tender. No rebound. No guarding.  Genitourinary: Deferred Musculoskeletal: Normal range of motion in all extremities. No lower extremity edema. Neurologic: Awake and alert. Not completely oriented.  Skin:  Maggots near site of right nephrostomy tube.  Psychiatric: Agitated.   ____________________________________________    LABS (pertinent positives/negatives)  Lactic acid 10.1 -> 3.1 CMP na 145, k 7.2, glu 38, cr 13.80 CBC wbc 10.1, hgb 12.0, plt 168 UA cloudy, large leukocytes, >50 wbc ____________________________________________   EKG  I, Nance Pear, attending physician, personally viewed and interpreted this EKG  EKG Time: 1940 Rate: 99 Rhythm: sinus rhythm Axis: right axis deviation Intervals: qtc 537 QRS: low voltage ST changes: peaked t waves v2 Impression: abnormal ekg  ____________________________________________    RADIOLOGY  CXR Bilateral lower lobe opacities.    ____________________________________________   PROCEDURES  Procedures  CRITICAL CARE Performed by: Nance Pear   Total critical care time: 45 minutes  Critical care time was exclusive of separately billable procedures and treating other patients.  Critical care was necessary to treat or prevent imminent or life-threatening deterioration.  Critical care was time spent personally by me on the following activities: development of treatment plan with patient and/or surrogate as well as nursing, discussions with consultants, evaluation of patient's response to treatment, examination of patient, obtaining history from patient or surrogate, ordering and performing treatments and interventions, ordering and review of laboratory studies,  ordering and review of radiographic studies, pulse oximetry and re-evaluation of patient's condition.  ____________________________________________   INITIAL IMPRESSION / ASSESSMENT AND PLAN / ED COURSE  Pertinent labs & imaging results that were available during my care of the patient were reviewed by me and considered in my medical decision making (see chart for details).   Patient presented to the emergency department today via EMS because of concerns for altered mental status.  On exam patient appeared quite unkempt.  He had clearly been soiling himself.  Additionally had maggots near the site of his nephrostomy tube.  Initial vital signs were concerning for tachypnea.  Patient is end-stage renal disease and states he has missed dialysis.  I did have concerns for potential sepsis given overall picture of the patient and he arrived.  Chest x-ray was concerning for possible pneumonia.  Initial lactate was quite elevated at 10.1.  At this time do have concerns that the patient might be under loaded given significant lactic acidosis.  Additionally I am not sure he has been able to hydrate given his condition.  Patient was started on broad-spectrum antibiotics.  Discussed with Dr. Juleen China about patient's elevated potassium.  At this time EKG with only some slight peaking of T waves.  At this time will plan on dialysis in the morning and will give  calcium albuterol and insulin here in the emergency department to help bring down potassium.  Patient will be admitted to the hospital. ____________________________________________   FINAL CLINICAL IMPRESSION(S) / ED DIAGNOSES  Final diagnoses:  Hyperkalemia  Altered mental status, unspecified altered mental status type  Lactic acidosis     Note: This dictation was prepared with Dragon dictation. Any transcriptional errors that result from this process are unintentional     Nance Pear, MD 09/05/20 1530

## 2020-09-04 NOTE — ED Notes (Signed)
Pt cleansed of maggots on buttocks, around nephrostomy tube. Cleansed of dried stool.

## 2020-09-05 LAB — RENAL FUNCTION PANEL
Albumin: 2.9 g/dL — ABNORMAL LOW (ref 3.5–5.0)
Anion gap: 22 — ABNORMAL HIGH (ref 5–15)
BUN: 125 mg/dL — ABNORMAL HIGH (ref 6–20)
CO2: 26 mmol/L (ref 22–32)
Calcium: 8.2 mg/dL — ABNORMAL LOW (ref 8.9–10.3)
Chloride: 92 mmol/L — ABNORMAL LOW (ref 98–111)
Creatinine, Ser: 11.77 mg/dL — ABNORMAL HIGH (ref 0.61–1.24)
GFR, Estimated: 5 mL/min — ABNORMAL LOW (ref >60)
Glucose, Bld: 474 mg/dL — ABNORMAL HIGH (ref 70–99)
Phosphorus: 11.3 mg/dL — ABNORMAL HIGH (ref 2.5–4.6)
Potassium: 5.1 mmol/L (ref 3.5–5.1)
Sodium: 140 mmol/L (ref 135–145)

## 2020-09-05 LAB — URINALYSIS, COMPLETE (UACMP) WITH MICROSCOPIC
Bilirubin Urine: NEGATIVE
Glucose, UA: 150 mg/dL — AB
Ketones, ur: NEGATIVE mg/dL
Nitrite: NEGATIVE
Protein, ur: >=300 mg/dL — AB
RBC / HPF: >50 RBC/hpf — ABNORMAL HIGH (ref 0–5)
Specific Gravity, Urine: 1.015 (ref 1.005–1.030)
WBC, UA: >50 WBC/hpf — ABNORMAL HIGH (ref 0–5)
pH: 8 (ref 5.0–8.0)

## 2020-09-05 LAB — CBG MONITORING, ED: Glucose-Capillary: 144 mg/dL — ABNORMAL HIGH (ref 70–99)

## 2020-09-05 LAB — CBC
HCT: 36 % — ABNORMAL LOW (ref 39.0–52.0)
Hemoglobin: 11.7 g/dL — ABNORMAL LOW (ref 13.0–17.0)
MCH: 31.7 pg (ref 26.0–34.0)
MCHC: 32.5 g/dL (ref 30.0–36.0)
MCV: 97.6 fL (ref 80.0–100.0)
Platelets: 119 10*3/uL — ABNORMAL LOW (ref 150–400)
RBC: 3.69 MIL/uL — ABNORMAL LOW (ref 4.22–5.81)
RDW: 18.7 % — ABNORMAL HIGH (ref 11.5–15.5)
WBC: 9.7 10*3/uL (ref 4.0–10.5)
nRBC: 1.5 % — ABNORMAL HIGH (ref 0.0–0.2)

## 2020-09-05 LAB — HEPATITIS B SURFACE ANTIGEN: Hepatitis B Surface Ag: NONREACTIVE

## 2020-09-05 LAB — PROCALCITONIN: Procalcitonin: 3.63 ng/mL

## 2020-09-05 LAB — LACTIC ACID, PLASMA: Lactic Acid, Venous: 5.1 mmol/L (ref 0.5–1.9)

## 2020-09-05 LAB — PROTIME-INR
INR: 1.6 — ABNORMAL HIGH (ref 0.8–1.2)
Prothrombin Time: 18.7 seconds — ABNORMAL HIGH (ref 11.4–15.2)

## 2020-09-05 MED ORDER — LACTATED RINGERS IV SOLN: INTRAVENOUS | Status: DC

## 2020-09-05 MED ORDER — CALCIUM ACETATE (PHOS BINDER) 667 MG PO CAPS
1334.0000 mg | ORAL_CAPSULE | Freq: Three times a day (TID) | ORAL | Status: DC
  Filled 2020-09-05 (×4): qty 2

## 2020-09-05 NOTE — Progress Notes (Addendum)
Pt arrived to [facility] room acutely confused, c/o feeling cold, dry mucous membranes and stated Pt was also a bit labored and tachypneic on arrival, oxygen was set to 4L on the tank on arrival and oxygen saturation in the low 80's  On 4L. Dr. Wannetta Sender made aware.

## 2020-09-05 NOTE — ED Notes (Signed)
Attending MD at bedside.

## 2020-09-05 NOTE — ED Notes (Signed)
Patient to dialysis.

## 2020-09-05 NOTE — ED Notes (Signed)
Took report from dialysis. 2.5 hours of dialysis. Removed 500 ML. No meds during treatment. 100% on 2LPM. 142/90 HR 99 RR 25. Fistula clamped off with guaze.

## 2020-09-05 NOTE — Progress Notes (Signed)
PT Cancellation Note  Patient Details Name: Larry Morrison MRN: [id number] DOB: 1970/07/28   Cancelled Treatment:    Reason Eval/Treat Not Completed: Other (comment). Pt out of room at this time, PT to attempt as able.   Lieutenant Diego PT, DPT 2:21 PM,09/05/20

## 2020-09-05 NOTE — ED Notes (Signed)
Pt refuses to have repeat blood sugar checked.

## 2020-09-05 NOTE — ED Notes (Signed)
Report given to dialysis.   

## 2020-09-05 NOTE — Progress Notes (Signed)
[facility]  ROUNDING NOTE   Subjective:   The patient request that I do not "mess with him" HPI is limited due to patients wiliness to answer questions. He reports that he is "fine"  Patient's last dialysis treatment on 08/18/20  Objective:  Vital signs in last 24 hours:  Temp:  [96.9 F (36.1 C)] 96.9 F (36.1 C) (04/16 1922) Pulse Rate:  [60-101] 94 (04/17 0900) Resp:  [15-53] 53 (04/17 0900) BP: (100-190)/(79-120) 169/109 (04/17 1000) SpO2:  [73 %-100 %] 91 % (04/17 0900) Weight:  [54.4 kg] 54.4 kg (04/16 1945)  Weight change:  Filed Weights   09/04/20 1945  Weight: 54.4 kg    Intake/Output: I/O last 3 completed shifts: In: 348.4 [IV Piggyback:348.4] Out: 600 [Urine:600]   Intake/Output this shift:  No intake/output data recorded.  Physical Exam: General: NAD, lying in bed   Head: Normocephalic, atraumatic. Moist oral mucosal membranes  Eyes: Anicteric, PERRL  Neck: Supple, trachea midline  Lungs:  +crackles throughout, elevated respiratory rate    Heart: Regular rate and rhythm   Abdomen:  Soft, nontender   Extremities:  no peripheral edema.  Neurologic: Alert and oriented   Skin: No lesions  Access: Right upper AVF     Basic Metabolic Panel: Recent Labs  Lab 09/04/20 1950 09/05/20 0658  NA 145 140  K 7.2* 5.1  CL 107 92*  CO2 7* 26  GLUCOSE 38* 474*  BUN 150* 125*  CREATININE 13.80* 11.77*  CALCIUM 9.3 8.2*  PHOS  --  11.3*    Liver Function Tests: Recent Labs  Lab 09/04/20 1950 09/05/20 0658  AST 38  --   ALT 49*  --   ALKPHOS 64  --   BILITOT 1.8*  --   PROT 8.6*  --   ALBUMIN 3.5 2.9*   No results for input(s): LIPASE, AMYLASE in the last 168 hours. No results for input(s): AMMONIA in the last 168 hours.  CBC: Recent Labs  Lab 09/04/20 1950 09/05/20 0500  WBC 10.1 9.7  NEUTROABS 9.5*  --   HGB 12.0* 11.7*  HCT 39.4 36.0*  MCV 102.6* 97.6  PLT 168 119*    Cardiac Enzymes: No results for input(s): CKTOTAL,  CKMB, CKMBINDEX, TROPONINI in the last 168 hours.  BNP: Invalid input(s): POCBNP  CBG: Recent Labs  Lab 09/04/20 1949 09/04/20 2010 09/04/20 2144 09/04/20 2306 09/05/20 0200  GLUCAP 33* 87 110* 123* 144*    Microbiology: Results for orders placed or performed during the hospital encounter of 09/04/20  Blood Culture (routine x 2)     Status: None (Preliminary result)   Collection Time: 09/04/20  7:50 PM   Specimen: BLOOD  Result Value Ref Range Status   Specimen Description BLOOD BLOOD LEFT FOREARM  Final   Special Requests   Final    BOTTLES DRAWN AEROBIC AND ANAEROBIC Blood Culture adequate volume   Culture   Final    NO GROWTH < 12 HOURS Performed at [facility], [address] [place], [place] [address]    Report Status PENDING  Incomplete  Resp Panel by RT-PCR (Flu A&B, Covid) Nasopharyngeal Swab     Status: None   Collection Time: 09/04/20 10:52 PM   Specimen: Nasopharyngeal Swab; Nasopharyngeal(NP) swabs in vial transport medium  Result Value Ref Range Status   SARS Coronavirus 2 by RT PCR NEGATIVE NEGATIVE Final    Comment: (NOTE) SARS-CoV-2 target nucleic acids are NOT DETECTED.  The SARS-CoV-2 RNA is generally detectable in  upper respiratory specimens during the acute phase of infection. The lowest concentration of SARS-CoV-2 viral copies this assay can detect is 138 copies/mL. A negative result does not preclude SARS-Cov-2 infection and should not be used as the sole basis for treatment or other patient management decisions. A negative result may occur with  improper specimen collection/handling, submission of specimen other than nasopharyngeal swab, presence of viral mutation(s) within the areas targeted by this assay, and inadequate number of viral copies(<138 copies/mL). A negative result must be combined with clinical observations, patient history, and epidemiological information. The expected result is Negative.  Fact Sheet for Patients:   EntrepreneurPulse.com.au  Fact Sheet for Healthcare Providers:  IncredibleEmployment.be  This test is no t yet approved or cleared by the [place] FDA and  has been authorized for detection and/or diagnosis of SARS-CoV-2 by FDA under an Emergency Use Authorization (EUA). This EUA will remain  in effect (meaning this test can be used) for the duration of the COVID-19 declaration under Section 564(b)(1) of the Act, 21 U.S.C.section 360bbb-3(b)(1), unless the authorization is terminated  or revoked sooner.       Influenza A by PCR NEGATIVE NEGATIVE Final   Influenza B by PCR NEGATIVE NEGATIVE Final    Comment: (NOTE) The Xpert Xpress SARS-CoV-2/FLU/RSV plus assay is intended as an aid in the diagnosis of influenza from Nasopharyngeal swab specimens and should not be used as a sole basis for treatment. Nasal washings and aspirates are unacceptable for Xpert Xpress SARS-CoV-2/FLU/RSV testing.  Fact Sheet for Patients: EntrepreneurPulse.com.au  Fact Sheet for Healthcare Providers: IncredibleEmployment.be  This test is not yet approved or cleared by the [place] FDA and has been authorized for detection and/or diagnosis of SARS-CoV-2 by FDA under an Emergency Use Authorization (EUA). This EUA will remain in effect (meaning this test can be used) for the duration of the COVID-19 declaration under Section 564(b)(1) of the Act, 21 U.S.C. section 360bbb-3(b)(1), unless the authorization is terminated or revoked.  Performed at [facility], [address] [place], [place] [address]     Coagulation Studies: Recent Labs    09/05/20 0500  LABPROT 18.7*  INR 1.6*    Urinalysis: Recent Labs    09/04/20 1937  COLORURINE YELLOW*  LABSPEC 1.015  PHURINE 8.0  GLUCOSEU 150*  HGBUR MODERATE*  BILIRUBINUR NEGATIVE  KETONESUR NEGATIVE  PROTEINUR >=300*  NITRITE NEGATIVE  LEUKOCYTESUR  LARGE*      Imaging: DG Chest Port 1 View  Result Date: 09/04/2020 CLINICAL DATA:  Questionable sepsis EXAM: PORTABLE CHEST 1 VIEW COMPARISON:  06/18/2020 FINDINGS: Cardiomegaly. Bilateral lower lobe airspace opacities, left greater than right. Suspect layering effusions. Findings concerning for pneumonia. No acute bony abnormality. IMPRESSION: Bilateral lower lobe opacities, left greater than right with probable small effusions. Findings concerning for pneumonia. Electronically Signed   By: Rolm Baptise M.D.   On: 09/04/2020 20:38     Medications:   . azithromycin    . cefTRIAXone (ROCEPHIN)  IV    . sodium bicarbonate 150 mEq in D5W infusion Stopped (09/05/20 0859)   . albuterol  2.5 mg Nebulization Once  . heparin  5,000 Units Subcutaneous Q8H  . sodium chloride flush  3 mL Intravenous Q12H   acetaminophen **OR** acetaminophen, ondansetron **OR** ondansetron (ZOFRAN) IV, senna-docusate  Assessment/ Plan:  [name] is a 50 y.o.  male with ESRD on HD, HTN, stroke, right nephrostomy tube  who presents to [facility] on 09/04/20 for altered mental status. Patient was found to  be hyperkalemic and hypoglycemic   [facility] M[facility] Left AVF 49kg   1. ESRD on dialysis with hyperkalemia  - patient is scheduled to get dialysis today.  - patient received novolog with D50, IV calcium gluconate and IV sodium bicarb and veltassa in the ED   - potassium 5.1 , down from 7   2. Anemia of CKD  - hgb 11.7   3. Secondary hyperparathyroidism of renal origin - phos 11.3  - on renvela as an outpatient     LOS: 1 Savio Albrecht P Milia Warth 4/17/202211:22 AM

## 2020-09-05 NOTE — ED Notes (Signed)
MD advised patient will be going up to Dialysis shortly. Waiting on dialysis RN to call for report.

## 2020-09-05 NOTE — Progress Notes (Signed)
PROGRESS NOTE    Larry Morrison  [id number] DOB: 06-20-1970 DOA: 09/04/2020 PCP: [facility]  Brief Narrative: Chronically ill noncompliant 51 year old African-American male with history of CVA, ESRD on hemodialysis, chronic right nephrostomy drain, nephrolithiasis, neurogenic bladder who was brought to the ED yesterday, EMS was called as family found him altered, when EMS arrived patient was covered in feces urine and maggots. -Very poor, limited historian -In the ED he was noted to be hypertensive, tachypneic, O2 sats dropped to 87%, labs were notable for potassium of 7.2, bicarb of 7, BUN of 150, chest x-ray noted bilateral lower lobe opacities left greater than right with probable small effusions, also noted to be hypoglycemic   Assessment & Plan:   Metabolic encephalopathy Uremia Noncompliance -Suspect his encephalopathy is primarily secondary to uremia, urine also appears abnormal and he has a right nephrostomy drain which likely has not been manage safely -Follow-up urine culture  Hyperkalemia ESRD on hemodialysis -Estimated dry weight is 50.5 kg per DC summary from [facility] -Missed HD for about 3 weeks now, was last hospitalized at [facility] in March, reportedly had missed dialysis for 1 month prior to that -Hyperkalemia improving, HD today  Bilateral lung opacities -Suspect this is secondary to fluid overload and less likely pneumonia, also had Covid earlier this year, could have residual findings secondary to that -Discontinue antibiotics tomorrow if cultures remain  Nephrolithiasis, neurogenic bladder -Chronic right nephrostomy drain -Last exchanged at [facility] on 02/24/2020 -Will discuss with IR regarding exchange  Hyperphosphatemia -Likely noncompliant with phosphate binders, resume PhosLo 3 times daily with meals  Hypoglycemia -Resolved, monitor CBG, check hemoglobin A1c  DVT prophylaxis: Subcutaneous heparin Code Status: Full code Family Communication: Discussed  patient, no family at bedside Disposition Plan:  Status is: Inpatient  Remains inpatient appropriate because:Inpatient level of care appropriate due to severity of illness   Dispo: The patient is from: Home              Anticipated d/c is to: SNF              Patient currently is not medically stable to d/c.   Difficult to place patient No  Consultants:   Nephrology   Procedures:   Antimicrobials:    Subjective: -Very poor historian, unable to tell me why he missed dialysis for 3 weeks  Objective: Vitals:   09/05/20 1000 09/05/20 1030 09/05/20 1100 09/05/20 1130  BP: (!) 169/109 (!) 155/111 (!) 156/97 (!) 160/97  Pulse:      Resp:    (!) 35  Temp:      TempSrc:      SpO2:      Weight:      Height:        Intake/Output Summary (Last 24 hours) at 09/05/2020 1153 Last data filed at 09/04/2020 2329 Gross per 24 hour  Intake 348.35 ml  Output 600 ml  Net -251.65 ml   Filed Weights   09/04/20 1945  Weight: 54.4 kg    Examination:  General exam: Chronically ill extremely disheveled young male appears much older than stated age, awake alert oriented to self and partly to place only CVS: S1-S2, regular rhythm, tachycardic Lungs: Bibasilar rales Abdomen: Soft, mildly distended, nontender, right nephrostomy drain noted Extremities: No edema, extremely dry scaly skin with long curled nails, diminished pulses  Psychiatry: Extremely poor insight and judgment  Data Reviewed:   CBC: Recent Labs  Lab 09/04/20 1950 09/05/20 0500  WBC 10.1 9.7  NEUTROABS 9.5*  --  HGB 12.0* 11.7*  HCT 39.4 36.0*  MCV 102.6* 97.6  PLT 168 123456*   Basic Metabolic Panel: Recent Labs  Lab 09/04/20 1950 09/05/20 0658  NA 145 140  K 7.2* 5.1  CL 107 92*  CO2 7* 26  GLUCOSE 38* 474*  BUN 150* 125*  CREATININE 13.80* 11.77*  CALCIUM 9.3 8.2*  PHOS  --  11.3*   GFR: Estimated Creatinine Clearance: 5.6 mL/min (A) (by C-G formula based on SCr of 11.77 mg/dL (H)). Liver  Function Tests: Recent Labs  Lab 09/04/20 1950 09/05/20 0658  AST 38  --   ALT 49*  --   ALKPHOS 64  --   BILITOT 1.8*  --   PROT 8.6*  --   ALBUMIN 3.5 2.9*   No results for input(s): LIPASE, AMYLASE in the last 168 hours. No results for input(s): AMMONIA in the last 168 hours. Coagulation Profile: Recent Labs  Lab 09/05/20 0500  INR 1.6*   Cardiac Enzymes: No results for input(s): CKTOTAL, CKMB, CKMBINDEX, TROPONINI in the last 168 hours. BNP (last 3 results) No results for input(s): PROBNP in the last 8760 hours. HbA1C: No results for input(s): HGBA1C in the last 72 hours. CBG: Recent Labs  Lab 09/04/20 1949 09/04/20 2010 09/04/20 2144 09/04/20 2306 09/05/20 0200  GLUCAP 33* 87 110* 123* 144*   Lipid Profile: No results for input(s): CHOL, HDL, LDLCALC, TRIG, CHOLHDL, LDLDIRECT in the last 72 hours. Thyroid Function Tests: No results for input(s): TSH, T4TOTAL, FREET4, T3FREE, THYROIDAB in the last 72 hours. Anemia Panel: No results for input(s): VITAMINB12, FOLATE, FERRITIN, TIBC, IRON, RETICCTPCT in the last 72 hours. Urine analysis:    Component Value Date/Time   COLORURINE YELLOW (A) 09/04/2020 1937   APPEARANCEUR CLOUDY (A) 09/04/2020 1937   APPEARANCEUR Turbid 09/19/2014 1914   LABSPEC 1.015 09/04/2020 1937   LABSPEC 1.011 09/19/2014 1914   PHURINE 8.0 09/04/2020 1937   GLUCOSEU 150 (A) 09/04/2020 1937   GLUCOSEU Negative 09/19/2014 1914   HGBUR MODERATE (A) 09/04/2020 [place] NEGATIVE 09/04/2020 1937   BILIRUBINUR Negative 09/19/2014 1914   KETONESUR NEGATIVE 09/04/2020 1937   PROTEINUR >=300 (A) 09/04/2020 1937   NITRITE NEGATIVE 09/04/2020 1937   LEUKOCYTESUR LARGE (A) 09/04/2020 1937   LEUKOCYTESUR 2+ 09/19/2014 1914   Sepsis Labs: '@LABRCNTIP'$ (procalcitonin:4,lacticidven:4)  ) Recent Results (from the past 240 hour(s))  Blood Culture (routine x 2)     Status: None (Preliminary result)   Collection Time: 09/04/20  7:50 PM    Specimen: BLOOD  Result Value Ref Range Status   Specimen Description BLOOD BLOOD LEFT FOREARM  Final   Special Requests   Final    BOTTLES DRAWN AEROBIC AND ANAEROBIC Blood Culture adequate volume   Culture   Final    NO GROWTH < 12 HOURS Performed at [facility], [address] [place], [place] [address]    Report Status PENDING  Incomplete  Resp Panel by RT-PCR (Flu A&B, Covid) Nasopharyngeal Swab     Status: None   Collection Time: 09/04/20 10:52 PM   Specimen: Nasopharyngeal Swab; Nasopharyngeal(NP) swabs in vial transport medium  Result Value Ref Range Status   SARS Coronavirus 2 by RT PCR NEGATIVE NEGATIVE Final    Comment: (NOTE) SARS-CoV-2 target nucleic acids are NOT DETECTED.  The SARS-CoV-2 RNA is generally detectable in upper respiratory specimens during the acute phase of infection. The lowest concentration of SARS-CoV-2 viral copies this assay can detect is 138 copies/mL. A negative result does  not preclude SARS-Cov-2 infection and should not be used as the sole basis for treatment or other patient management decisions. A negative result may occur with  improper specimen collection/handling, submission of specimen other than nasopharyngeal swab, presence of viral mutation(s) within the areas targeted by this assay, and inadequate number of viral copies(<138 copies/mL). A negative result must be combined with clinical observations, patient history, and epidemiological information. The expected result is Negative.  Fact Sheet for Patients:  EntrepreneurPulse.com.au  Fact Sheet for Healthcare Providers:  IncredibleEmployment.be  This test is no t yet approved or cleared by the [place] FDA and  has been authorized for detection and/or diagnosis of SARS-CoV-2 by FDA under an Emergency Use Authorization (EUA). This EUA will remain  in effect (meaning this test can be used) for the duration of the COVID-19  declaration under Section 564(b)(1) of the Act, 21 U.S.C.section 360bbb-3(b)(1), unless the authorization is terminated  or revoked sooner.       Influenza A by PCR NEGATIVE NEGATIVE Final   Influenza B by PCR NEGATIVE NEGATIVE Final    Comment: (NOTE) The Xpert Xpress SARS-CoV-2/FLU/RSV plus assay is intended as an aid in the diagnosis of influenza from Nasopharyngeal swab specimens and should not be used as a sole basis for treatment. Nasal washings and aspirates are unacceptable for Xpert Xpress SARS-CoV-2/FLU/RSV testing.  Fact Sheet for Patients: EntrepreneurPulse.com.au  Fact Sheet for Healthcare Providers: IncredibleEmployment.be  This test is not yet approved or cleared by the [place] FDA and has been authorized for detection and/or diagnosis of SARS-CoV-2 by FDA under an Emergency Use Authorization (EUA). This EUA will remain in effect (meaning this test can be used) for the duration of the COVID-19 declaration under Section 564(b)(1) of the Act, 21 U.S.C. section 360bbb-3(b)(1), unless the authorization is terminated or revoked.  Performed at [facility], [address], [place], [place] [address]          Radiology Studies: [facility] Chest [place] 1 View  Result Date: 09/04/2020 CLINICAL DATA:  Questionable sepsis EXAM: PORTABLE CHEST 1 VIEW COMPARISON:  06/18/2020 FINDINGS: Cardiomegaly. Bilateral lower lobe airspace opacities, left greater than right. Suspect layering effusions. Findings concerning for pneumonia. No acute bony abnormality. IMPRESSION: Bilateral lower lobe opacities, left greater than right with probable small effusions. Findings concerning for pneumonia. Electronically Signed   By: Rolm Baptise M.D.   On: 09/04/2020 20:38   Scheduled Meds: . albuterol  2.5 mg Nebulization Once  . calcium acetate  1,334 mg Oral TID WC  . heparin  5,000 Units Subcutaneous Q8H  . sodium chloride flush  3 mL Intravenous  Q12H   Continuous Infusions: . azithromycin    . cefTRIAXone (ROCEPHIN)  IV    . sodium bicarbonate 150 mEq in D5W infusion Stopped (09/05/20 0859)     LOS: 1 day    Time spent: 44mn  PDomenic Polite MD [facility] 09/05/2020, 11:53 AM

## 2020-09-05 NOTE — ED Notes (Signed)
Pt arrived via EMS stretcher. Pt covered in feces, urine, and maggots. Pt was washed, connected to monitor and in clean gown. Maggots coming from nephrostomy on right side, MD made aware.

## 2020-09-05 NOTE — ED Notes (Signed)
Attempted to offer pt dinner, pt states that he does not want to eat right now. Pt irritable and cursing at staff.

## 2020-09-06 DIAGNOSIS — G9341 Metabolic encephalopathy: Secondary | ICD-10-CM | POA: Diagnosis present

## 2020-09-06 LAB — HEMOGLOBIN A1C
Hgb A1c MFr Bld: 5 % (ref 4.8–5.6)
Mean Plasma Glucose: 97 mg/dL

## 2020-09-06 LAB — HEPATITIS B DNA, ULTRAQUANTITATIVE, PCR
HBV DNA SERPL PCR-ACNC: NOT DETECTED IU/mL
HBV DNA SERPL PCR-LOG IU: UNDETERMINED log10 IU/mL

## 2020-09-06 LAB — HEPATITIS B SURFACE ANTIBODY, QUANTITATIVE: Hep B S AB Quant (Post): <3.1 m[IU]/mL — ABNORMAL LOW (ref >9.9)

## 2020-09-06 MED ORDER — SEVELAMER CARBONATE 800 MG PO TABS
2400.0000 mg | ORAL_TABLET | Freq: Three times a day (TID) | ORAL | Status: DC
  Filled 2020-09-06 (×5): qty 3

## 2020-09-06 NOTE — ED Notes (Signed)
Report to dialysis 

## 2020-09-06 NOTE — ED Notes (Signed)
Pt given phone to talk with his mother as requested by mother. Pt was willing to take call.

## 2020-09-06 NOTE — ED Notes (Signed)
Provider Opyd verbal that he will come to bedside to assess nephrostomy site soon.

## 2020-09-06 NOTE — ED Notes (Signed)
Report to I. Stage manager. No reply yet.

## 2020-09-06 NOTE — ED Notes (Signed)
Pt back from dialysis. Pt requesting to eat. Lunch tray remains on bedside table; pt notified will be given dinner tray as soon as it arrives to ER.

## 2020-09-06 NOTE — ED Notes (Signed)
Pt still in dialysis 

## 2020-09-06 NOTE — Evaluation (Signed)
Physical Therapy Evaluation Patient Details Name: Larry Morrison MRN: [id number] DOB: [birth year] Today's Date: 09/06/2020   History of Present Illness  Pt admitted for ESRD with complaints of AMS. HIstory includes ESRD on HD, CVA, brain aneursym, HTN, chronic R nephrostomy, and neurogenic bladder.  Clinical Impression  Pt is a pleasant 50 year old male who was admitted for ESRD and has been non compliant with treatments. Pt performs bed mobility reluctantly with min assist. Refused further attempts at mobility and care in general. Cursing loudly at this writer and asking for another tray of food. RN notified. Pt demonstrates deficits with cognition/safety/endurance/mobility. Reports B feet are very painful, unwilling to let therapist assess strength in feet. Unkept based on appearance. Would benefit from skilled PT to address above deficits and promote optimal return to PLOF; recommend transition to STR upon discharge from acute hospitalization.     Follow Up Recommendations SNF    Equipment Recommendations   (TBD)    Recommendations for Other Services       Precautions / Restrictions Precautions Precautions: Fall Restrictions Weight Bearing Restrictions: No      Mobility  Bed Mobility Overal bed mobility: Needs Assistance Bed Mobility: Rolling Rolling: Min assist         General bed mobility comments: with increased effort, does roll to R side. Uses use railing for rolling. Pt unwilling to further participate in activity.    Transfers                    Ambulation/Gait                Stairs            Wheelchair Mobility    Modified Rankin (Stroke Patients Only)       Balance                                             Pertinent Vitals/Pain Pain Assessment: Faces Faces Pain Scale: Hurts whole lot Pain Location: B feet Pain Descriptors / Indicators: Aching Pain Intervention(s): Limited activity within patient's  tolerance;Repositioned    Home Living Family/patient expects to be discharged to:: Private residence Living Arrangements: Alone     Home Access: Level entry     Home Layout: One level Home Equipment: Anctil - 2 wheels Additional Comments: pt is poor historian, unsure of accuracy.    Prior Function Level of Independence: Independent with assistive device(s)         Comments: poor historian. Reports he was ambulatory using RW     Hand Dominance        Extremity/Trunk Assessment   Upper Extremity Assessment Upper Extremity Assessment: Generalized weakness (B UE grossly 3/5)    Lower Extremity Assessment Lower Extremity Assessment: Generalized weakness (B LE grossly 3/5)       Communication   Communication: No difficulties  Cognition Arousal/Alertness: Awake/alert Behavior During Therapy: Agitated Overall Cognitive Status: Difficult to assess                                        General Comments      Exercises     Assessment/Plan    PT Assessment Patient needs continued PT services  PT Problem List Decreased strength;Decreased mobility;Decreased cognition;Decreased safety awareness;Pain  PT Treatment Interventions DME instruction;Gait training;Therapeutic activities;Therapeutic exercise    PT Goals (Current goals can be found in the Care Plan section)  Acute Rehab PT Goals Patient Stated Goal: to get out of the hospital PT Goal Formulation: With patient Time For Goal Achievement: 09/20/20 Potential to Achieve Goals: Good    Frequency Min 2X/week   Barriers to discharge        Co-evaluation               AM-PAC PT "6 Clicks" Mobility  Outcome Measure Help needed turning from your back to your side while in a flat bed without using bedrails?: A Little Help needed moving from lying on your back to sitting on the side of a flat bed without using bedrails?: A Lot Help needed moving to and from a bed to a chair  (including a wheelchair)?: A Lot Help needed standing up from a chair using your arms (e.g., wheelchair or bedside chair)?: A Lot Help needed to walk in hospital room?: A Lot Help needed climbing 3-5 steps with a railing? : A Lot 6 Click Score: 13    End of Session   Activity Tolerance: Patient limited by pain Patient left: in bed Nurse Communication: Mobility status PT Visit Diagnosis: Muscle weakness (generalized) (M62.81);Pain Pain - Right/Left:  (bilat) Pain - part of body: Ankle and joints of foot    Time: 1000-1012 PT Time Calculation (min) (ACUTE ONLY): 12 min   Charges:   PT Evaluation $PT Eval Low Complexity: 1 Low          Greggory Stallion, PT, DPT [phone]   [place] 09/06/2020, 1:46 PM

## 2020-09-06 NOTE — Consult Note (Signed)
PHARMACY - PHYSICIAN COMMUNICATION CRITICAL VALUE ALERT - BLOOD CULTURE IDENTIFICATION (BCID)  Larry Morrison is an 50 y.o. male who presented to [facility] on 09/04/2020 with a chief complaint of AMS  Assessment:  1 out of 2 bottles growing GPR. (include suspected source if known)  Name of physician (or Provider) Contacted: [place]  Current antibiotics: ceftriaxone + azithromycin  Changes to prescribed antibiotics recommended: Pt is afeb and WNL WBC. Will continue current abx, as this may be a contaminant. Follow up with culture data.  Recommendations accepted by provider  No results found for this or any previous visit.  Oswald Hillock 09/06/2020  12:10 AM

## 2020-09-06 NOTE — Progress Notes (Signed)
[facility]  ROUNDING NOTE   Subjective:   Emergent hemodialysis treatment yesterday. Patient is confused this morning.   Objective:  Vital signs in last 24 hours:  Temp:  [98 F (36.7 C)] 98 F (36.7 C) (04/17 1617) Pulse Rate:  [80-96] 89 (04/18 1200) Resp:  [15-33] 16 (04/18 1200) BP: (104-162)/(76-107) 126/84 (04/18 1200) SpO2:  [89 %-100 %] 97 % (04/18 1200)  Weight change:  Filed Weights   09/04/20 1945  Weight: 54.4 kg    Intake/Output: I/O last 3 completed shifts: In: 348.4 [IV Piggyback:348.4] Out: 1100 [Urine:600; Other:500]   Intake/Output this shift:  No intake/output data recorded.  Physical Exam: General: NAD, sitting up  Head: Normocephalic, atraumatic. Moist oral mucosal membranes  Eyes: Anicteric, PERRL  Neck: Supple, trachea midline  Lungs:  +crackles     Heart: Regular rate and rhythm   Abdomen:  Soft, nontender   Extremities:  no peripheral edema.  Neurologic: confused  Skin: No lesions  Access: Right upper AVF     Basic Metabolic Panel: Recent Labs  Lab 09/04/20 1950 09/05/20 0658  NA 145 140  K 7.2* 5.1  CL 107 92*  CO2 7* 26  GLUCOSE 38* 474*  BUN 150* 125*  CREATININE 13.80* 11.77*  CALCIUM 9.3 8.2*  PHOS  --  11.3*    Liver Function Tests: Recent Labs  Lab 09/04/20 1950 09/05/20 0658  AST 38  --   ALT 49*  --   ALKPHOS 64  --   BILITOT 1.8*  --   PROT 8.6*  --   ALBUMIN 3.5 2.9*   No results for input(s): LIPASE, AMYLASE in the last 168 hours. No results for input(s): AMMONIA in the last 168 hours.  CBC: Recent Labs  Lab 09/04/20 1950 09/05/20 0500  WBC 10.1 9.7  NEUTROABS 9.5*  --   HGB 12.0* 11.7*  HCT 39.4 36.0*  MCV 102.6* 97.6  PLT 168 119*    Cardiac Enzymes: No results for input(s): CKTOTAL, CKMB, CKMBINDEX, TROPONINI in the last 168 hours.  BNP: Invalid input(s): POCBNP  CBG: Recent Labs  Lab 09/04/20 1949 09/04/20 2010 09/04/20 2144 09/04/20 2306 09/05/20 0200  GLUCAP  33* 87 110* 123* 144*    Microbiology: Results for orders placed or performed during the hospital encounter of 09/04/20  Blood Culture (routine x 2)     Status: None (Preliminary result)   Collection Time: 09/04/20  7:50 PM   Specimen: BLOOD  Result Value Ref Range Status   Specimen Description BLOOD BLOOD LEFT FOREARM  Final   Special Requests   Final    BOTTLES DRAWN AEROBIC AND ANAEROBIC Blood Culture adequate volume   Culture  Setup Time   Final    GRAM POSITIVE RODS AEROBIC BOTTLE ONLY CRITICAL RESULT CALLED TO, READ BACK BY AND VERIFIED WITH: Winfield Rast PATEL AT 2359 09/05/20 MF Performed at [facility], [address] [place], [place] [address]    Culture GRAM POSITIVE RODS  Final   Report Status PENDING  Incomplete  Resp Panel by RT-PCR (Flu A&B, Covid) Nasopharyngeal Swab     Status: None   Collection Time: 09/04/20 10:52 PM   Specimen: Nasopharyngeal Swab; Nasopharyngeal(NP) swabs in vial transport medium  Result Value Ref Range Status   SARS Coronavirus 2 by RT PCR NEGATIVE NEGATIVE Final    Comment: (NOTE) SARS-CoV-2 target nucleic acids are NOT DETECTED.  The SARS-CoV-2 RNA is generally detectable in upper respiratory specimens during the acute phase of infection. The  lowest concentration of SARS-CoV-2 viral copies this assay can detect is 138 copies/mL. A negative result does not preclude SARS-Cov-2 infection and should not be used as the sole basis for treatment or other patient management decisions. A negative result may occur with  improper specimen collection/handling, submission of specimen other than nasopharyngeal swab, presence of viral mutation(s) within the areas targeted by this assay, and inadequate number of viral copies(<138 copies/mL). A negative result must be combined with clinical observations, patient history, and epidemiological information. The expected result is Negative.  Fact Sheet for Patients:   EntrepreneurPulse.com.au  Fact Sheet for Healthcare Providers:  IncredibleEmployment.be  This test is no t yet approved or cleared by the [place] FDA and  has been authorized for detection and/or diagnosis of SARS-CoV-2 by FDA under an Emergency Use Authorization (EUA). This EUA will remain  in effect (meaning this test can be used) for the duration of the COVID-19 declaration under Section 564(b)(1) of the Act, 21 U.S.C.section 360bbb-3(b)(1), unless the authorization is terminated  or revoked sooner.       Influenza A by PCR NEGATIVE NEGATIVE Final   Influenza B by PCR NEGATIVE NEGATIVE Final    Comment: (NOTE) The Xpert Xpress SARS-CoV-2/FLU/RSV plus assay is intended as an aid in the diagnosis of influenza from Nasopharyngeal swab specimens and should not be used as a sole basis for treatment. Nasal washings and aspirates are unacceptable for Xpert Xpress SARS-CoV-2/FLU/RSV testing.  Fact Sheet for Patients: EntrepreneurPulse.com.au  Fact Sheet for Healthcare Providers: IncredibleEmployment.be  This test is not yet approved or cleared by the [place] FDA and has been authorized for detection and/or diagnosis of SARS-CoV-2 by FDA under an Emergency Use Authorization (EUA). This EUA will remain in effect (meaning this test can be used) for the duration of the COVID-19 declaration under Section 564(b)(1) of the Act, 21 U.S.C. section 360bbb-3(b)(1), unless the authorization is terminated or revoked.  Performed at [facility], [address] [place],  [address]     Coagulation Studies: Recent Labs    09/05/20 0500  LABPROT 18.7*  INR 1.6*    Urinalysis: Recent Labs    09/04/20 1937  COLORURINE YELLOW*  LABSPEC 1.015  PHURINE 8.0  GLUCOSEU 150*  HGBUR MODERATE*  BILIRUBINUR NEGATIVE  KETONESUR NEGATIVE  PROTEINUR >=300*  NITRITE NEGATIVE  LEUKOCYTESUR  LARGE*      Imaging: DG Chest Port 1 View  Result Date: 09/04/2020 CLINICAL DATA:  Questionable sepsis EXAM: PORTABLE CHEST 1 VIEW COMPARISON:  06/18/2020 FINDINGS: Cardiomegaly. Bilateral lower lobe airspace opacities, left greater than right. Suspect layering effusions. Findings concerning for pneumonia. No acute bony abnormality. IMPRESSION: Bilateral lower lobe opacities, left greater than right with probable small effusions. Findings concerning for pneumonia. Electronically Signed   By: Rolm Baptise M.D.   On: 09/04/2020 20:38     Medications:   . azithromycin Stopped (09/06/20 0305)  . cefTRIAXone (ROCEPHIN)  IV Stopped (09/06/20 0200)  . sodium bicarbonate 150 mEq in D5W infusion Stopped (09/05/20 0859)   . albuterol  2.5 mg Nebulization Once  . calcium acetate  1,334 mg Oral TID WC  . heparin  5,000 Units Subcutaneous Q8H  . sodium chloride flush  3 mL Intravenous Q12H   acetaminophen **OR** acetaminophen, ondansetron **OR** ondansetron (ZOFRAN) IV, senna-docusate  Assessment/ Plan:  [name] is a 50 y.o.  male with ESRD on HD, HTN, stroke, right nephrostomy tube  who presents to [facility] on 09/04/20 for altered mental status. Patient was  found to be hyperkalemic and hypoglycemic   [facility] M[facility] Left AVF 49kg   1. ESRD on dialysis with hyperkalemia  - patient is scheduled to get dialysis today.  - Consult palliative care - patient is not sure he wants to continue dialysis.   2. Anemia of CKD  - hgb 11.7   3. Secondary hyperparathyroidism of renal origin: with Hyperphosphatemia - Continue sevelamer    LOS: 2 Larry Morrison 4/18/202212:35 PM

## 2020-09-06 NOTE — Progress Notes (Signed)
OT Cancellation Note  Patient Details Name: Larry Morrison MRN: [id number] DOB: 24-Jan-1971   Cancelled Treatment:    Reason Eval/Treat Not Completed: Other (comment). Consult received, chart reviewed. Pt declines OOB attempts, required MAX A to reposition on stretcher, stating he did not want to assist with repositioning. Staff entered to take pt to dialysis. Unavailable for full OT evaluation. Will re-attempt next date.   Hanley Hays, MPH, MS, OTR/L ascom [phone] 09/06/20, 1:48 PM

## 2020-09-06 NOTE — ED Notes (Signed)
Pt refusing medications, sugar checks and blood draw. States he wants to leave. informed pt that his mother called and requesting call back-states he does not want to call her. Clean and dry visible to RN as pt will not allow me to roll him.

## 2020-09-06 NOTE — ED Notes (Addendum)
Awakened by RN. Refusing AM labs or finger sticks.

## 2020-09-06 NOTE — ED Notes (Signed)
Lunch tray noted on pt's bedside table. Pt yet to start eating from it.

## 2020-09-06 NOTE — Progress Notes (Addendum)
PROGRESS NOTE    Larry Morrison  [id number] DOB: 16-Apr-1971 DOA: 09/04/2020 PCP: [facility]  Brief Narrative: Chronically ill noncompliant 50 year old African-American male with history of CVA, ESRD on hemodialysis, chronic right nephrostomy drain, nephrolithiasis, neurogenic bladder who was brought to the ED yesterday, EMS was called as family found him altered, when EMS arrived patient was covered in feces urine and maggots. -Very poor, limited historian -In the ED he was noted to be hypertensive, tachypneic, O2 sats dropped to 87%, labs were notable for potassium of 7.2, bicarb of 7, BUN of 150, chest x-ray noted bilateral lower lobe opacities left greater than right with probable small effusions, also noted to be hypoglycemic   Assessment & Plan:   Metabolic encephalopathy Uremia Noncompliance -Suspect his encephalopathy is primarily secondary to uremia, urine also appears abnormal and he has a right nephrostomy drain which likely has not been managed safely -Awake and alert now, with profound cognitive deficits -PT OT, ideally needs placement but refuses this -Called patient's mother and emphasized need for closer supervision  Hyperkalemia ESRD on hemodialysis -Estimated dry weight is 50.5 kg per DC summary from [facility] -Missed HD for about 3 weeks now, was last hospitalized at [facility] in March, reportedly had missed dialysis for 1 month prior to that -Hyperkalemia improved -HD again today  Bilateral lung opacities -Suspect this is secondary to fluid overload and less likely pneumonia, also had Covid earlier this year, could have residual findings secondary to that -Gram-positive cocci in 1 out of 2 blood cultures likely contaminant -Discontinue antibiotics today  Nephrolithiasis, neurogenic bladder -Chronic right nephrostomy drain -Last exchanged at [facility] on 02/24/2020 -Will discuss with IR regarding exchange  Hyperphosphatemia -Likely noncompliant with phosphate  binders, resume PhosLo 3 times daily with meals  Hypoglycemia -Resolved, monitor CBG, check hemoglobin A1c  DVT prophylaxis: Subcutaneous heparin Code Status: Full code Family Communication: No family at bedside, called and updated patient's mother Disposition Plan:  Status is: Inpatient  Remains inpatient appropriate because:Inpatient level of care appropriate due to severity of illness   Dispo: The patient is from: Home              Anticipated d/c is to: Home in 1 to 2 days              Patient currently is not medically stable to d/c.   Difficult to place patient No  Consultants:   Nephrology   Procedures:   Antimicrobials:    Subjective: -About to go back for dialysis again, very poor historian, wants to go home  Objective: Vitals:   09/06/20 1400 09/06/20 1415 09/06/20 1430 09/06/20 1445  BP: (!) 140/93 (!) 136/94 (!) 142/93 (!) 154/88  Pulse: 95 86 85 88  Resp: (!) 27 (!) 26 (!) 24 (!) 25  Temp: (!) 97.5 F (36.4 C)     TempSrc: Oral     SpO2: 100% 100% 100% 100%  Weight:      Height:       No intake or output data in the 24 hours ending 09/06/20 1511 Filed Weights   09/04/20 1945  Weight: 54.4 kg    Examination:  General exam: Chronically ill extremely disheveled young male appears much older than stated age, awake alert oriented to self and partly to place only, significant cognitive and memory deficits CVS: S1-S2, regular rhythm Lungs: Very few basilar rales Abdomen: Soft, nontender, right nephrostomy drain noted Extremities: No edema, extremely scaly dry skin with long curly nails, diminished pulses Psychiatry: Extremely  poor insight and judgment  Data Reviewed:   CBC: Recent Labs  Lab 09/04/20 1950 09/05/20 0500  WBC 10.1 9.7  NEUTROABS 9.5*  --   HGB 12.0* 11.7*  HCT 39.4 36.0*  MCV 102.6* 97.6  PLT 168 123456*   Basic Metabolic Panel: Recent Labs  Lab 09/04/20 1950 09/05/20 0658  NA 145 140  K 7.2* 5.1  CL 107 92*  CO2 7* 26   GLUCOSE 38* 474*  BUN 150* 125*  CREATININE 13.80* 11.77*  CALCIUM 9.3 8.2*  PHOS  --  11.3*   GFR: Estimated Creatinine Clearance: 5.6 mL/min (A) (by C-G formula based on SCr of 11.77 mg/dL (H)). Liver Function Tests: Recent Labs  Lab 09/04/20 1950 09/05/20 0658  AST 38  --   ALT 49*  --   ALKPHOS 64  --   BILITOT 1.8*  --   PROT 8.6*  --   ALBUMIN 3.5 2.9*   No results for input(s): LIPASE, AMYLASE in the last 168 hours. No results for input(s): AMMONIA in the last 168 hours. Coagulation Profile: Recent Labs  Lab 09/05/20 0500  INR 1.6*   Cardiac Enzymes: No results for input(s): CKTOTAL, CKMB, CKMBINDEX, TROPONINI in the last 168 hours. BNP (last 3 results) No results for input(s): PROBNP in the last 8760 hours. HbA1C: No results for input(s): HGBA1C in the last 72 hours. CBG: Recent Labs  Lab 09/04/20 1949 09/04/20 2010 09/04/20 2144 09/04/20 2306 09/05/20 0200  GLUCAP 33* 87 110* 123* 144*   Lipid Profile: No results for input(s): CHOL, HDL, LDLCALC, TRIG, CHOLHDL, LDLDIRECT in the last 72 hours. Thyroid Function Tests: No results for input(s): TSH, T4TOTAL, FREET4, T3FREE, THYROIDAB in the last 72 hours. Anemia Panel: No results for input(s): VITAMINB12, FOLATE, FERRITIN, TIBC, IRON, RETICCTPCT in the last 72 hours. Urine analysis:    Component Value Date/Time   COLORURINE YELLOW (A) 09/04/2020 1937   APPEARANCEUR CLOUDY (A) 09/04/2020 1937   APPEARANCEUR Turbid 09/19/2014 1914   LABSPEC 1.015 09/04/2020 1937   LABSPEC 1.011 09/19/2014 1914   PHURINE 8.0 09/04/2020 1937   GLUCOSEU 150 (A) 09/04/2020 1937   GLUCOSEU Negative 09/19/2014 1914   HGBUR MODERATE (A) 09/04/2020 [place] NEGATIVE 09/04/2020 1937   BILIRUBINUR Negative 09/19/2014 1914   KETONESUR NEGATIVE 09/04/2020 1937   PROTEINUR >=300 (A) 09/04/2020 1937   NITRITE NEGATIVE 09/04/2020 1937   LEUKOCYTESUR LARGE (A) 09/04/2020 1937   LEUKOCYTESUR 2+ 09/19/2014 1914    Sepsis Labs: '@LABRCNTIP'$ (procalcitonin:4,lacticidven:4)  ) Recent Results (from the past 240 hour(s))  Blood Culture (routine x 2)     Status: None (Preliminary result)   Collection Time: 09/04/20  7:50 PM   Specimen: BLOOD  Result Value Ref Range Status   Specimen Description BLOOD BLOOD LEFT FOREARM  Final   Special Requests   Final    BOTTLES DRAWN AEROBIC AND ANAEROBIC Blood Culture adequate volume   Culture  Setup Time   Final    GRAM POSITIVE RODS AEROBIC BOTTLE ONLY CRITICAL RESULT CALLED TO, READ BACK BY AND VERIFIED WITH: Eleonore Chiquito AT 2359 09/05/20 MF Performed at [facility], [address], [place], [place] [address]    Culture GRAM POSITIVE RODS  Final   Report Status PENDING  Incomplete  Resp Panel by RT-PCR (Flu A&B, Covid) Nasopharyngeal Swab     Status: None   Collection Time: 09/04/20 10:52 PM   Specimen: Nasopharyngeal Swab; Nasopharyngeal(NP) swabs in vial transport medium  Result Value Ref Range Status  SARS Coronavirus 2 by RT PCR NEGATIVE NEGATIVE Final    Comment: (NOTE) SARS-CoV-2 target nucleic acids are NOT DETECTED.  The SARS-CoV-2 RNA is generally detectable in upper respiratory specimens during the acute phase of infection. The lowest concentration of SARS-CoV-2 viral copies this assay can detect is 138 copies/mL. A negative result does not preclude SARS-Cov-2 infection and should not be used as the sole basis for treatment or other patient management decisions. A negative result may occur with  improper specimen collection/handling, submission of specimen other than nasopharyngeal swab, presence of viral mutation(s) within the areas targeted by this assay, and inadequate number of viral copies(<138 copies/mL). A negative result must be combined with clinical observations, patient history, and epidemiological information. The expected result is Negative.  Fact Sheet for Patients:   EntrepreneurPulse.com.au  Fact Sheet for Healthcare Providers:  IncredibleEmployment.be  This test is no t yet approved or cleared by the [place] FDA and  has been authorized for detection and/or diagnosis of SARS-CoV-2 by FDA under an Emergency Use Authorization (EUA). This EUA will remain  in effect (meaning this test can be used) for the duration of the COVID-19 declaration under Section 564(b)(1) of the Act, 21 U.S.C.section 360bbb-3(b)(1), unless the authorization is terminated  or revoked sooner.       Influenza A by PCR NEGATIVE NEGATIVE Final   Influenza B by PCR NEGATIVE NEGATIVE Final    Comment: (NOTE) The Xpert Xpress SARS-CoV-2/FLU/RSV plus assay is intended as an aid in the diagnosis of influenza from Nasopharyngeal swab specimens and should not be used as a sole basis for treatment. Nasal washings and aspirates are unacceptable for Xpert Xpress SARS-CoV-2/FLU/RSV testing.  Fact Sheet for Patients: EntrepreneurPulse.com.au  Fact Sheet for Healthcare Providers: IncredibleEmployment.be  This test is not yet approved or cleared by the [place] FDA and has been authorized for detection and/or diagnosis of SARS-CoV-2 by FDA under an Emergency Use Authorization (EUA). This EUA will remain in effect (meaning this test can be used) for the duration of the COVID-19 declaration under Section 564(b)(1) of the Act, 21 U.S.C. section 360bbb-3(b)(1), unless the authorization is terminated or revoked.  Performed at [facility], [address] [place], [place] [address]          Radiology Studies: [facility] Chest [place] 1 View  Result Date: 09/04/2020 CLINICAL DATA:  Questionable sepsis EXAM: PORTABLE CHEST 1 VIEW COMPARISON:  06/18/2020 FINDINGS: Cardiomegaly. Bilateral lower lobe airspace opacities, left greater than right. Suspect layering effusions. Findings concerning for  pneumonia. No acute bony abnormality. IMPRESSION: Bilateral lower lobe opacities, left greater than right with probable small effusions. Findings concerning for pneumonia. Electronically Signed   By: Rolm Baptise M.D.   On: 09/04/2020 20:38   Scheduled Meds: . albuterol  2.5 mg Nebulization Once  . heparin  5,000 Units Subcutaneous Q8H  . sevelamer carbonate  2,400 mg Oral TID WC  . sodium chloride flush  3 mL Intravenous Q12H   Continuous Infusions: . azithromycin Stopped (09/06/20 0305)  . cefTRIAXone (ROCEPHIN)  IV Stopped (09/06/20 0200)  . sodium bicarbonate 150 mEq in D5W infusion Stopped (09/05/20 0859)     LOS: 2 days    Time spent: 75mn  PDomenic Polite MD [facility] 09/06/2020, 3:11 PM

## 2020-09-06 NOTE — ED Notes (Addendum)
Pt refusing CBG check and renvela. Pt educated and still refused BG check and meds.

## 2020-09-06 NOTE — ED Notes (Addendum)
Provider T. Opyd notified via secure chat about possible 1 inch shift of nephrostomy line. EDP Smith verbal that he will assess at bedside soon.

## 2020-09-06 NOTE — Progress Notes (Signed)
No oral or verbal report received, secure chart reviewed

## 2020-09-06 NOTE — ED Notes (Signed)
Imma E, RN for pt going to room 233 2A contacted for questions prior to transport.

## 2020-09-06 NOTE — ED Notes (Signed)
1L removed per Dialysis RN; states pt will be done with dialysis in about 81mns. VSS.

## 2020-09-06 NOTE — ED Notes (Signed)
Pt reports that he is clean and dry. No visible soiling to brief. Pt states that he is continent. Refusing to allow RN to do any care. Given new blanket.

## 2020-09-06 NOTE — ED Notes (Signed)
Report to Georgie, RN.

## 2020-09-06 NOTE — ED Notes (Addendum)
Blankets adjusted for pt. Lights dimmed for pt. Stretcher locked low. Rails up. Call bell within reach. Pt denies any needs; pt requesting this RN "leave me (him) alone so I (he) can sleep". Pt refused CBG check.

## 2020-09-06 NOTE — ED Notes (Signed)
Pt given dinner tray. Repositioned in bed. Pericare provided; bed pads changed; pt c/o pain at nephrostomy site; line assessed; C/D/I but stitching line to hold in place about 1 inch from skin rather than attached directly to it. EDP Smith notified in person and requested that he assess it. Tape applied about 2 inches below that to hold line in place against skin.

## 2020-09-06 NOTE — ED Notes (Signed)
Pt laying on L arm which currently has BP cuff on it; pt refuses to lay on back temporarily or at least shift L arm out from underneath self to have BP reading completed. Pt currently denies pain. Pt leaving for dialysis.

## 2020-09-06 NOTE — ED Notes (Signed)
Secure chat to Dr. Domenic Polite updated that pt refusing care.

## 2020-09-06 NOTE — ED Notes (Signed)
No floor RN name attached to pt's chart yet to start giving report. ED Charge RN notified.

## 2020-09-06 NOTE — Progress Notes (Signed)
Cross-coverage note:   Asked to assess nephrostomy tube. There is suture material tied around tube, not secured to skin. Tube has been secured in place with tape. Would follow clinically, monitor output. Consider imaging though per attending's progress note, tube may be exchanged anyway.

## 2020-09-06 NOTE — ED Notes (Signed)
Provided breakfast and assisted to cut up so patient can eat.

## 2020-09-06 NOTE — ED Notes (Signed)
Provider T. Opyd verbal that site was assessed and looks appropriate/baseline for pt to him.

## 2020-09-06 NOTE — ED Notes (Signed)
Cursing and yelling loudly at this RN.

## 2020-09-06 NOTE — ED Notes (Signed)
Pt not yet back from dialysis.

## 2020-09-06 NOTE — ED Notes (Signed)
Pt remains off unit at dialysis

## 2020-09-07 ENCOUNTER — Other Ambulatory Visit: Payer: Self-pay

## 2020-09-07 DIAGNOSIS — Z87820 Personal history of traumatic brain injury: Secondary | ICD-10-CM

## 2020-09-07 DIAGNOSIS — R4189 Other symptoms and signs involving cognitive functions and awareness: Secondary | ICD-10-CM

## 2020-09-07 DIAGNOSIS — Z7189 Other specified counseling: Secondary | ICD-10-CM

## 2020-09-07 LAB — COMPREHENSIVE METABOLIC PANEL
ALT: 36 U/L (ref 0–44)
AST: 27 U/L (ref 15–41)
Albumin: 2.5 g/dL — ABNORMAL LOW (ref 3.5–5.0)
Alkaline Phosphatase: 59 U/L (ref 38–126)
Anion gap: 14 (ref 5–15)
BUN: 40 mg/dL — ABNORMAL HIGH (ref 6–20)
CO2: 24 mmol/L (ref 22–32)
Calcium: 8 mg/dL — ABNORMAL LOW (ref 8.9–10.3)
Chloride: 101 mmol/L (ref 98–111)
Creatinine, Ser: 5.45 mg/dL — ABNORMAL HIGH (ref 0.61–1.24)
GFR, Estimated: 12 mL/min — ABNORMAL LOW (ref >60)
Glucose, Bld: 80 mg/dL (ref 70–99)
Potassium: 3.9 mmol/L (ref 3.5–5.1)
Sodium: 139 mmol/L (ref 135–145)
Total Bilirubin: 1.4 mg/dL — ABNORMAL HIGH (ref 0.3–1.2)
Total Protein: 6.6 g/dL (ref 6.5–8.1)

## 2020-09-07 LAB — CBC
HCT: 33 % — ABNORMAL LOW (ref 39.0–52.0)
Hemoglobin: 10.5 g/dL — ABNORMAL LOW (ref 13.0–17.0)
MCH: 31.8 pg (ref 26.0–34.0)
MCHC: 31.8 g/dL (ref 30.0–36.0)
MCV: 100 fL (ref 80.0–100.0)
Platelets: 78 10*3/uL — ABNORMAL LOW (ref 150–400)
RBC: 3.3 MIL/uL — ABNORMAL LOW (ref 4.22–5.81)
RDW: 18.3 % — ABNORMAL HIGH (ref 11.5–15.5)
WBC: 7.1 10*3/uL (ref 4.0–10.5)
nRBC: 0.3 % — ABNORMAL HIGH (ref 0.0–0.2)

## 2020-09-07 LAB — GLUCOSE, CAPILLARY: Glucose-Capillary: 104 mg/dL — ABNORMAL HIGH (ref 70–99)

## 2020-09-07 NOTE — Progress Notes (Signed)
[facility]  ROUNDING NOTE   Subjective:   Patient seen resting in bed with breakfast at bedside He has only eaten half a fruit cup Refuses remainder of meal Became agitated during visit, began cursing  Objective:  Vital signs in last 24 hours:  Temp:  [97.5 F (36.4 C)-98.7 F (37.1 C)] 98.4 F (36.9 C) (04/19 0735) Pulse Rate:  [81-96] 96 (04/19 0735) Resp:  [12-28] 12 (04/19 0735) BP: (126-154)/(73-96) 127/83 (04/19 0735) SpO2:  [96 %-100 %] 98 % (04/19 0735) Weight:  [52.8 kg] 52.8 kg (04/19 0425)  Weight change:  Filed Weights   09/04/20 1945 09/07/20 0425  Weight: 54.4 kg 52.8 kg    Intake/Output: I/O last 3 completed shifts: In: 31 [P.O.:360; Other:100] Out: 800 [Urine:300; Other:500]   Intake/Output this shift:  No intake/output data recorded.  Physical Exam: General: NAD, lying in bed   Head: Normocephalic, atraumatic. Moist oral mucosal membranes  Eyes: Anicteric  Lungs:  + basilar wheezing, normal breathing effort  Heart: Regular rate and rhythm   Abdomen:  Soft, nontender   Extremities:  no peripheral edema.  Neurologic: Alert and oriented   Skin: No lesions  Access: Right upper AVF     Basic Metabolic Panel: Recent Labs  Lab 09/04/20 1950 09/05/20 0658 09/07/20 0552  NA 145 140 139  K 7.2* 5.1 3.9  CL 107 92* 101  CO2 7* 26 24  GLUCOSE 38* 474* 80  BUN 150* 125* 40*  CREATININE 13.80* 11.77* 5.45*  CALCIUM 9.3 8.2* 8.0*  PHOS  --  11.3*  --     Liver Function Tests: Recent Labs  Lab 09/04/20 1950 09/05/20 0658 09/07/20 0552  AST 38  --  27  ALT 49*  --  36  ALKPHOS 64  --  59  BILITOT 1.8*  --  1.4*  PROT 8.6*  --  6.6  ALBUMIN 3.5 2.9* 2.5*   No results for input(s): LIPASE, AMYLASE in the last 168 hours. No results for input(s): AMMONIA in the last 168 hours.  CBC: Recent Labs  Lab 09/04/20 1950 09/05/20 0500 09/07/20 0552  WBC 10.1 9.7 7.1  NEUTROABS 9.5*  --   --   HGB 12.0* 11.7* 10.5*  HCT  39.4 36.0* 33.0*  MCV 102.6* 97.6 100.0  PLT 168 119* 78*    Cardiac Enzymes: No results for input(s): CKTOTAL, CKMB, CKMBINDEX, TROPONINI in the last 168 hours.  BNP: Invalid input(s): POCBNP  CBG: Recent Labs  Lab 09/04/20 1949 09/04/20 2010 09/04/20 2144 09/04/20 2306 09/05/20 0200  GLUCAP 33* 87 110* 123* 144*    Microbiology: Results for orders placed or performed during the hospital encounter of 09/04/20  Blood Culture (routine x 2)     Status: None (Preliminary result)   Collection Time: 09/04/20  7:50 PM   Specimen: BLOOD  Result Value Ref Range Status   Specimen Description BLOOD BLOOD LEFT FOREARM  Final   Special Requests   Final    BOTTLES DRAWN AEROBIC AND ANAEROBIC Blood Culture adequate volume   Culture  Setup Time   Final    GRAM POSITIVE RODS AEROBIC BOTTLE ONLY CRITICAL RESULT CALLED TO, READ BACK BY AND VERIFIED WITH: Eleonore Chiquito AT 2359 09/05/20 MF Performed at [facility], [address], [place], [place] [address]    Culture GRAM POSITIVE RODS  Final   Report Status PENDING  Incomplete  Resp Panel by RT-PCR (Flu A&B, Covid) Nasopharyngeal Swab     Status: None  Collection Time: 09/04/20 10:52 PM   Specimen: Nasopharyngeal Swab; Nasopharyngeal(NP) swabs in vial transport medium  Result Value Ref Range Status   SARS Coronavirus 2 by RT PCR NEGATIVE NEGATIVE Final    Comment: (NOTE) SARS-CoV-2 target nucleic acids are NOT DETECTED.  The SARS-CoV-2 RNA is generally detectable in upper respiratory specimens during the acute phase of infection. The lowest concentration of SARS-CoV-2 viral copies this assay can detect is 138 copies/mL. A negative result does not preclude SARS-Cov-2 infection and should not be used as the sole basis for treatment or other patient management decisions. A negative result may occur with  improper specimen collection/handling, submission of specimen other than nasopharyngeal swab, presence of viral  mutation(s) within the areas targeted by this assay, and inadequate number of viral copies(<138 copies/mL). A negative result must be combined with clinical observations, patient history, and epidemiological information. The expected result is Negative.  Fact Sheet for Patients:  EntrepreneurPulse.com.au  Fact Sheet for Healthcare Providers:  IncredibleEmployment.be  This test is no t yet approved or cleared by the [place] FDA and  has been authorized for detection and/or diagnosis of SARS-CoV-2 by FDA under an Emergency Use Authorization (EUA). This EUA will remain  in effect (meaning this test can be used) for the duration of the COVID-19 declaration under Section 564(b)(1) of the Act, 21 U.S.C.section 360bbb-3(b)(1), unless the authorization is terminated  or revoked sooner.       Influenza A by PCR NEGATIVE NEGATIVE Final   Influenza B by PCR NEGATIVE NEGATIVE Final    Comment: (NOTE) The Xpert Xpress SARS-CoV-2/FLU/RSV plus assay is intended as an aid in the diagnosis of influenza from Nasopharyngeal swab specimens and should not be used as a sole basis for treatment. Nasal washings and aspirates are unacceptable for Xpert Xpress SARS-CoV-2/FLU/RSV testing.  Fact Sheet for Patients: EntrepreneurPulse.com.au  Fact Sheet for Healthcare Providers: IncredibleEmployment.be  This test is not yet approved or cleared by the [place] FDA and has been authorized for detection and/or diagnosis of SARS-CoV-2 by FDA under an Emergency Use Authorization (EUA). This EUA will remain in effect (meaning this test can be used) for the duration of the COVID-19 declaration under Section 564(b)(1) of the Act, 21 U.S.C. section 360bbb-3(b)(1), unless the authorization is terminated or revoked.  Performed at [facility], [address] [place], [place] [address]     Coagulation Studies: Recent  Labs    09/05/20 0500  LABPROT 18.7*  INR 1.6*    Urinalysis: Recent Labs    09/04/20 1937  COLORURINE YELLOW*  LABSPEC 1.015  PHURINE 8.0  GLUCOSEU 150*  HGBUR MODERATE*  BILIRUBINUR NEGATIVE  KETONESUR NEGATIVE  PROTEINUR >=300*  NITRITE NEGATIVE  LEUKOCYTESUR LARGE*      Imaging: No results found.   Medications:    . albuterol  2.5 mg Nebulization Once  . heparin  5,000 Units Subcutaneous Q8H  . sevelamer carbonate  2,400 mg Oral TID WC  . sodium chloride flush  3 mL Intravenous Q12H   acetaminophen **OR** acetaminophen, ondansetron **OR** ondansetron (ZOFRAN) IV, senna-docusate  Assessment/ Plan:  Mr. Mariel Swayzer is a 50 y.o.  male with ESRD on HD, HTN, stroke, right nephrostomy tube  who presents to [facility] on 09/04/20 for altered mental status. Patient was found to be hyperkalemic and hypoglycemic   [facility] MWF [facility] Left AVF 49kg   1. ESRD on dialysis with hyperkalemia  - Received dialysis yesterday - UF 546m achieved  - Potassium 3.9 -  Scheduled for dialysis tomorrow - We spoke with patient about the dedication required to be successful on dialysis. He agrees to maintain a MWF outpatient schedule  2. Anemia of CKD  - hgb 10.5 - at goal  3. Secondary hyperparathyroidism of renal origin - phos 11.3  - on renvela as an outpatient     LOS: 3 Layden Caterino 4/19/20229:59 AM

## 2020-09-07 NOTE — Progress Notes (Signed)
Pt refused heparin SQ and finger sticks last night, refused lab draws this morning. Pt refused to  Be turned. Pt reoriented and educated on importance of care, pt still refused cares

## 2020-09-07 NOTE — Consult Note (Signed)
[facility] Consult   Reason for Consult: Consult for 50 year old man on dialysis who was brought to the hospital after missing dialysis sessions with altered mental status.  Consult because of inappropriate behavior and concern about capacity Referring Physician: Broadus Jessel Gettinger Patient Identification: Larry Morrison MRN:  [id number] Principal Diagnosis: History of traumatic brain injury Diagnosis:  Principal Problem:   History of traumatic brain injury Active Problems:   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[place])   Neurogenic bladder   Hyperkalemia   ESRD needing dialysis ([place])   Hypoglycemia   Uremia   Metabolic encephalopathy   Cognitive impairment   Total Time spent with patient: 1 hour  Subjective:   Larry Morrison is a 50 y.o. male patient admitted with "what the hell do you want?".  HPI: Chart reviewed.  Patient seen.  I tried to reach his mother at the contact number in the chart but there was no answer and no voicemail.  Patient with multiple chronic medical problems including being on dialysis brought to the hospital after family reportedly found him with altered mental status having soiled himself.  Presumably having missed dialysis sessions.  Patient has been in the hospital for a couple of days and has had dialysis now.  Team expresses concern about his mental state.  I came to see the patient today.  He woke up when I came in the room.  I introduced myself.  Patient immediately responded with profanity and hostility.  He really did not appropriately cooperate with any part of the conversation.  I explained that the treatment team just wanted to make sure that he was thinking clearly.  He kept on with the profanity and lack of cooperation.  His attitude and behavior make it impossible to know whether his answers to questions should be taken at face value.  He tells me that he did not know why he was in the hospital.  He did express that he knew that he got dialysis.  He was not able to explain  to me anything at all about what dialysis was or what it did for you.  Patient denied that he had missed any treatments.  Denied that he had any illnesses at all in fact.  Denied that he had any problems.  States that he lives by himself.  He denied to me that he has been using marijuana cocaine alcohol or any other drugs.  No drug screen or alcohol level were done on admission so we do not have any data about that.  Patient answered most questions with curses and personal insults.  Did not appear to be hallucinating.  I tried to explain that the treatment team wanted to provide him the best possible care because he had serious medical conditions.  None of this improved his attitude or cooperation.  Past Psychiatric History: I do not see any comment in his chart either here or from outside facilities about psychiatric concerns.  Information about his mental state is superficial at best.  Usually however if the patient is chronically hostile he will see some indication of it in the notes and I did not find that.  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  Also does have an apparent past history of marijuana and cocaine use at some times although it does not look like it is been addressed as an issue.  I had been hoping to find out from his family what his most recent baseline was and what his chronic  baseline was but do not have any information about that.  Patient did not make any statements to me about wanting to die.  Risk to Self:   Risk to Others:   Prior Inpatient Therapy:   Prior Outpatient Therapy:    Past Medical History:  Past Medical History:  Diagnosis Date  . Anemia   . 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 Dialysis patient ([place])   . Dyspnea   . Foot drop   . History of kidney stones   . History of nephrostomy   . Hypertension   . Renal disorder   . Stroke [facility])     Past Surgical History:  Procedure Laterality Date  . AV FISTULA PLACEMENT  Right 08/07/2019   Procedure: ARTERIOVENOUS (AV) FISTULA CREATION;  Surgeon: Algernon Huxley, MD;  Location: [facility] ORS;  Service: Vascular;  Laterality: Right;  . AV FISTULA PLACEMENT Right 11/20/2019   Procedure: INSERTION OF ARTERIOVENOUS (AV) GORE-TEX GRAFT ARM;  Surgeon: Algernon Huxley, MD;  Location: [facility] ORS;  Service: Vascular;  Laterality: Right;  . NEPHRECTOMY Left   . NEPHRECTOMY    . ORCHIECTOMY Right 10/27/2016   Procedure: PSB ORCHIECTOMY;  Surgeon: Hollice Espy, MD;  Location: [facility] ORS;  Service: Urology;  Laterality: Right;  . ORCHIOPEXY Bilateral 10/27/2016   Procedure: ORCHIOPEXY ADULT;  Surgeon: Hollice Espy, MD;  Location: [facility] ORS;  Service: Urology;  Laterality: Bilateral;  . SCROTAL EXPLORATION Bilateral 10/27/2016   Procedure: SCROTUM EXPLORATION;  Surgeon: Hollice Espy, MD;  Location: [facility] ORS;  Service: Urology;  Laterality: Bilateral;   Family History:  Family History  Family history unknown: Yes   Family Psychiatric  History: Unknown Social History:  Social History   Substance and Sexual Activity  Alcohol Use No     Social History   Substance and Sexual Activity  Drug Use Yes  . Types: Marijuana   Comment: today    Social History   Socioeconomic History  . Marital status: Single    Spouse name: Not on file  . Number of children: Not on file  . Years of education: Not on file  . Highest education level: Not on file  Occupational History  . Not on file  Tobacco Use  . Smoking status: Former Smoker    Packs/day: 0.50    Types: Cigarettes  . Smokeless tobacco: Never Used  Vaping Use  . Vaping Use: Never used  Substance and Sexual Activity  . Alcohol use: No  . Drug use: Yes    Types: Marijuana    Comment: today  . Sexual activity: Not Currently  Other Topics Concern  . Not on file  Social History Narrative  . Not on file   Social Determinants of Health   Financial Resource Strain: Not on file  Food Insecurity: Not on file  Transportation  Needs: Not on file  Physical Activity: Not on file  Stress: Not on file  Social Connections: Not on file   Additional Social History:    Allergies:   Allergies  Allergen Reactions  . Vancomycin     Patient denies - repeated denial to pharm tech 09-05-2020    Labs:  Results for orders placed or performed during the hospital encounter of 09/04/20 (from the past 48 hour(s))  CBC     Status: Abnormal   Collection Time: 09/07/20  5:52 AM  Result Value Ref Range   WBC 7.1 4.0 - 10.5 K/uL   RBC 3.30 (L) 4.22 -  5.81 MIL/uL   Hemoglobin 10.5 (L) 13.0 - 17.0 g/dL   HCT 33.0 (L) 39.0 - 52.0 %   MCV 100.0 80.0 - 100.0 fL   MCH 31.8 26.0 - 34.0 pg   MCHC 31.8 30.0 - 36.0 g/dL   RDW 18.3 (H) 11.5 - 15.5 %   Platelets 78 (L) 150 - 400 K/uL    Comment: Immature Platelet Fraction may be clinically indicated, consider ordering this additional test [id number] PLATELET COUNT CONFIRMED BY SMEAR    nRBC 0.3 (H) 0.0 - 0.2 %    Comment: Performed at [facility], [address] [place], [place] [address]  Comprehensive metabolic panel     Status: Abnormal   Collection Time: 09/07/20  5:52 AM  Result Value Ref Range   Sodium 139 135 - 145 mmol/L   Potassium 3.9 3.5 - 5.1 mmol/L   Chloride 101 98 - 111 mmol/L   CO2 24 22 - 32 mmol/L   Glucose, Bld 80 70 - 99 mg/dL    Comment: Glucose reference range applies only to samples taken after fasting for at least 8 hours.   BUN 40 (H) 6 - 20 mg/dL   Creatinine, Ser 5.45 (H) 0.61 - 1.24 mg/dL   Calcium 8.0 (L) 8.9 - 10.3 mg/dL   Total Protein 6.6 6.5 - 8.1 g/dL   Albumin 2.5 (L) 3.5 - 5.0 g/dL   AST 27 15 - 41 U/L   ALT 36 0 - 44 U/L   Alkaline Phosphatase 59 38 - 126 U/L   Total Bilirubin 1.4 (H) 0.3 - 1.2 mg/dL   GFR, Estimated 12 (L) >60 mL/min    Comment: (NOTE) Calculated using the CKD-EPI Creatinine Equation (2021)    Anion gap 14 5 - 15    Comment: Performed at [facility], [address] [place], [place] [address]   Glucose, capillary     Status: Abnormal   Collection Time: 09/07/20 11:05 AM  Result Value Ref Range   Glucose-Capillary 104 (H) 70 - 99 mg/dL    Comment: Glucose reference range applies only to samples taken after fasting for at least 8 hours.   Comment 1 Notify RN    Comment 2 Document in Chart     Current Facility-Administered Medications  Medication Dose Route Frequency Provider Last Rate Last Admin  . acetaminophen (TYLENOL) tablet 650 mg  650 mg Oral Q6H PRN Lenore Cordia, MD       Or  . acetaminophen (TYLENOL) suppository 650 mg  650 mg Rectal Q6H PRN Zada Finders R, MD      . albuterol (PROVENTIL) (2.5 MG/3ML) 0.083% nebulizer solution 2.5 mg  2.5 mg Nebulization Once Nance Pear, MD      . heparin injection 5,000 Units  5,000 Units Subcutaneous Q8H Lenore Cordia, MD   5,000 Units at 09/07/20 0539  . ondansetron (ZOFRAN) tablet 4 mg  4 mg Oral Q6H PRN Lenore Cordia, MD       Or  . ondansetron (ZOFRAN) injection 4 mg  4 mg Intravenous Q6H PRN Lenore Cordia, MD      . senna-docusate (Senokot-S) tablet 1 tablet  1 tablet Oral QHS PRN Zada Finders R, MD      . sevelamer carbonate (RENVELA) tablet 2,400 mg  2,400 mg Oral TID WC Kolluru, Sarath, MD      . sodium chloride flush (NS) 0.9 % injection 3 mL  3 mL Intravenous Q12H Lenore Cordia, MD   3  mL at 09/06/20 0107    Musculoskeletal: Strength & Muscle Tone: decreased Gait & Station: unsteady Patient leans: N/A            Psychiatric Specialty Exam:  Presentation  General Appearance: No data recorded Eye Contact:No data recorded Speech:No data recorded Speech Volume:No data recorded Handedness:No data recorded  Mood and Affect  Mood:No data recorded Affect:No data recorded  Thought Process  Thought Processes:No data recorded Descriptions of Associations:No data recorded Orientation:No data recorded Thought Content:No data recorded History of Schizophrenia/Schizoaffective disorder:No data  recorded Duration of Psychotic Symptoms:No data recorded Hallucinations:No data recorded Ideas of Reference:No data recorded Suicidal Thoughts:No data recorded Homicidal Thoughts:No data recorded  Sensorium  Memory:No data recorded Judgment:No data recorded Insight:No data recorded  Executive Functions  Concentration:No data recorded Attention Span:No data recorded Recall:No data recorded Fund of Knowledge:No data recorded Language:No data recorded  Psychomotor Activity  Psychomotor Activity:No data recorded  Assets  Assets:No data recorded  Sleep  Sleep:No data recorded  Physical Exam: Physical Exam Vitals and nursing note reviewed.  Constitutional:      Appearance: Normal appearance. He is ill-appearing.  HENT:     Head: Normocephalic and atraumatic.     Mouth/Throat:     Pharynx: Oropharynx is clear.  Eyes:     Pupils: Pupils are equal, round, and reactive to light.  Cardiovascular:     Rate and Rhythm: Normal rate and regular rhythm.  Pulmonary:     Effort: Pulmonary effort is normal.     Breath sounds: Normal breath sounds.  Abdominal:     General: Abdomen is flat.     Palpations: Abdomen is soft.  Musculoskeletal:        General: Normal range of motion.  Skin:    General: Skin is warm and dry.  Neurological:     General: No focal deficit present.     Mental Status: He is alert. Mental status is at baseline.  Psychiatric:        Attention and Perception: He is inattentive.        Mood and Affect: Mood normal. Affect is angry and inappropriate.        Speech: Speech is tangential.        Behavior: Behavior is agitated.        Thought Content: Thought content normal. Thought content does not include homicidal or suicidal ideation.        Cognition and Memory: Cognition is impaired. Memory is impaired.        Judgment: Judgment is inappropriate.    Review of Systems  Unable to perform ROS: Mental status change   Blood pressure (!) 142/90, pulse 92,  temperature (!) 97.5 F (36.4 C), resp. rate 16, height '5\' 4"'$  (1.626 m), weight 52.8 kg, SpO2 99 %. Body mass index is 19.98 kg/m.  Treatment Plan Summary: Plan 50 year old man with multiple medical problems on dialysis.  Question about why he has been noncompliant with dialysis and not taking care of his health.  Patient was so uncooperative and hostile that we could not even begin to explore that.  His behavior and mental state were not delirious but instead were of the type I sometimes sees with brain injured patients.  Based on the interview I had with him he does not show capacity to make most decisions because he did not even show a willingness to engage in conversation.  He told me that not only did he not know why he was in the hospital he  did not care.  At this point I do not think that he should be considered to have the capacity to make a decision such as skipping dialysis because I do not see any evidence that he really understands what the treatment is about or why it is important.  This mental state could change.  At this point however I think the patient would in his current state definitely benefit from having a guardian.  No indication for any specific medication to treat this at this time.  If he were to become belligerent to the point of violence to himself or others we would consider antipsychotic medicine probably  Disposition: As noted above patient is grossly uncooperative.  There is no specific treatment offered but I believe he would very much be an appropriate candidate for having a guardian or someone else making decisions because right now he does not appear to have the ability to engage in appropriate conversation.  Alethia Berthold, MD 09/07/2020 5:07 PM

## 2020-09-07 NOTE — Consult Note (Addendum)
Consultation Note Date: 09/07/2020   Patient Name: Larry Morrison  DOB: February 25, 1971  MRN: [id number]  Age / Sex: 50 y.o., male  PCP: [facility] Referring Physician: Domenic Polite, MD  Reason for Consultation: Establishing goals of care  HPI/Patient Profile: Larry Morrison is a 50 y.o. male who presents to the emergency department today via EMS after family checked on patient and found him altered.   Clinical Assessment and Goals of Care: Patient is resting in bed. Upon walking in patient asks "what the f**k do you want?!" Explained my role and purpose. He states he lives at home alone. He tells me he is unmarried. He has 1 daughter.  He states he has been doing dialysis for a while but cannot tell me how long. Discussed dialysis prior to this admission and he states he has been going to dialysis regularly. Discussed concerns upon arrival to the hospital about him not going to dialysis regularly. He said "they're lying!"  Attempted to discuss his diagnosis, prognosis, GOC, EOL wishes disposition and options.  Created space and opportunity for patient  to explore thoughts and feelings regarding current medical information. He tells me he wants to do dialysis but "I don't want to do the extra bulls**t". He also complains of being admitted into the hospital currently. Asked him what the extra things were and he said "I ain't doing no classes." Attempted to discuss how sometimes things are required such as lab work and life style modifications in order to do dialysis and he again states he wants to do dialysis but does not want "to do the extra bulls**t". He also states he is "not doing dialysis today, tomorrow, or the next day."   Attempted to discuss his options, and the difference between an aggressive medical intervention path and a comfort care path was discussed. Discussed his thoughts on involving his  family in the discussion and decision making and he said "no!" He stated he wanted to do dialysis and he began to curse at me while telling me to leave.   I am concerned about his ability to understand the connection between the need for testing/ interventions and dialysis, or the importance of timing of interventions that is required in order to continue dialysis.    SUMMARY OF RECOMMENDATIONS   Could obtain psychiatry consult for capacity.  If patient has capacity and chooses to stop dialysis, he would be a candidate for hospice. If he lacks capacity, would need to discuss further planning with his family. He currently does not want me to speak with his family.     Prognosis:   Poor      Primary Diagnoses: Present on Admission: . Neurogenic bladder . Hyperkalemia . Hypoglycemia . Uremia . Metabolic encephalopathy   I have reviewed the medical record, interviewed the patient and family, and examined the patient. The following aspects are pertinent.  Past Medical History:  Diagnosis Date  . Anemia   . 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 Dialysis patient (  HCC)   . Dyspnea   . Foot drop   . History of kidney stones   . History of nephrostomy   . Hypertension   . Renal disorder   . Stroke [facility])    Social History   Socioeconomic History  . Marital status: Single    Spouse name: Not on file  . Number of children: Not on file  . Years of education: Not on file  . Highest education level: Not on file  Occupational History  . Not on file  Tobacco Use  . Smoking status: Former Smoker    Packs/day: 0.50    Types: Cigarettes  . Smokeless tobacco: Never Used  Vaping Use  . Vaping Use: Never used  Substance and Sexual Activity  . Alcohol use: No  . Drug use: Yes    Types: Marijuana    Comment: today  . Sexual activity: Not Currently  Other Topics Concern  . Not on file  Social History Narrative  . Not on file   Social Determinants of Health   Financial  Resource Strain: Not on file  Food Insecurity: Not on file  Transportation Needs: Not on file  Physical Activity: Not on file  Stress: Not on file  Social Connections: Not on file   Family History  Family history unknown: Yes   Scheduled Meds: . albuterol  2.5 mg Nebulization Once  . heparin  5,000 Units Subcutaneous Q8H  . sevelamer carbonate  2,400 mg Oral TID WC  . sodium chloride flush  3 mL Intravenous Q12H   Continuous Infusions: PRN Meds:.acetaminophen **OR** acetaminophen, ondansetron **OR** ondansetron (ZOFRAN) IV, senna-docusate Medications Prior to Admission:  Prior to Admission medications   Not on File   Allergies  Allergen Reactions  . Vancomycin     Patient denies - repeated denial to pharm tech 09-05-2020   Review of Systems  All other systems reviewed and are negative.   Physical Exam Pulmonary:     Effort: Pulmonary effort is normal.  Neurological:     Mental Status: He is alert.     Vital Signs: BP 127/83 (BP Location: Left Arm)   Pulse 96   Temp 98.4 F (36.9 C) (Oral)   Resp 12   Ht '5\' 4"'$  (1.626 m)   Wt 52.8 kg   SpO2 98%   BMI 19.98 kg/m  Pain Scale: 0-10 POSS *See Group Information*: 1-Acceptable,Awake and alert Pain Score: 0-No pain   SpO2: SpO2: 98 % O2 Device:SpO2: 98 % O2 Flow Rate: .O2 Flow Rate (L/min): 2 L/min  IO: Intake/output summary:   Intake/Output Summary (Last 24 hours) at 09/07/2020 1042 Last data filed at 09/07/2020 1017 Gross per 24 hour  Intake 640 ml  Output 800 ml  Net -160 ml    LBM: Last BM Date: 09/06/20 Baseline Weight: Weight: 54.4 kg Most recent weight: Weight: 52.8 kg        Time In: 10:20 Time Out: 10:50 Time Total: 30 min Greater than 50%  of this time was spent counseling and coordinating care related to the above assessment and plan.  Signed by: Asencion Gowda, NP   Please contact Palliative Medicine Team phone at [phone] for questions and concerns.  For individual provider: See  Shea Evans

## 2020-09-07 NOTE — Progress Notes (Signed)
OT Cancellation Note  Patient Details Name: Larry Morrison MRN: [id number] DOB: 1970/10/05   Cancelled Treatment:    Reason Eval/Treat Not Completed: Patient declined, no reason specified. 2nd attempt to see patient since admission. Pt just finished breakfast, eyes closed, but wakes easily. Oriented to self and place but not to situation or date/time. Pt educated in role of OT and benefits of therapy to support his health and maintain strength and independence with ADL tasks. Pt adamantly declines, cursing at therapist to leave, and stating, "I can do everything by myself, I don't need no *expletive* therapy." Similar behavior on 1st attempt yesterday. Will sign off at this time as pt is refusing OT despite encouragement.   Hanley Hays, MPH, MS, OTR/L ascom [phone] 09/07/20, 10:01 AM

## 2020-09-07 NOTE — Progress Notes (Signed)
   09/07/20 1121  Vitals  Temp (!) 97.5 F (36.4 C)  BP (!) 142/90  MAP (mmHg) 104  BP Location Left Arm  BP Method Automatic  Patient Position (if appropriate) Lying  Pulse Rate 92  Pulse Rate Source Monitor  Resp 16  MEWS COLOR  MEWS Score Color Green  Oxygen Therapy  SpO2 99 %  MEWS Score  MEWS Temp 0  MEWS Systolic 0  MEWS Pulse 0  MEWS RR 0  MEWS LOC 0  MEWS Score 0   Pt is refusing all care except vital signs.  Refusing all medications and CBG this AM.  Pt has been educated on refusal of care and is very verbally aggressive.  MD made aware.

## 2020-09-07 NOTE — Progress Notes (Signed)
Pt refusing VS, BG check, bedtime medications, and head to toe assessment. Upon entering room, nurse and NT were told to "get the f### out." Education pertaining to above procedures attempted but refused. Provider notified.

## 2020-09-07 NOTE — Progress Notes (Addendum)
PROGRESS NOTE    Larry Morrison  [id number] DOB: 03-27-1971 DOA: 09/04/2020 PCP: [facility]  Brief Narrative: Chronically ill noncompliant 50 year old African-American male with history of CVA, ESRD on hemodialysis, chronic right nephrostomy drain, nephrolithiasis, neurogenic bladder who was brought to the ED 4/16, EMS was called as family found him altered, when EMS arrived patient was covered in feces urine and maggots. -Very poor, limited historian -In the ED he was noted to be hypertensive, tachypneic, O2 sats dropped to 87%, labs were notable for potassium of 7.2, bicarb of 7, BUN of 150, chest x-ray noted bilateral lower lobe opacities left greater than right with probable small effusions. -Previously admitted to [facility] in March after missing hemodialysis for 3 to 4 weeks  Subjective: -Angry and cursing at all staff this morning, would not answer any questions  Assessment & Plan:   Metabolic encephalopathy Uremia Noncompliance -Initially was encephalopathic which was primarily secondary to uremia, urine also appears abnormal and he has a right nephrostomy drain which likely has not been managed safely -BUN down to 40 from 150 on admission -Awake and alert now, cursing, with some cognitive deficits -PT OT, ideally needs SNF -Called patient's mother and emphasized need for closer supervision, she appears somewhat helpless -Main issue is noncompliance with dialysis, palliative consulted, psychiatry consulted for capacity  Hyperkalemia ESRD on hemodialysis -Estimated dry weight is 50.5 kg per DC summary from [facility] -Missed HD for about 2-3 weeks now, was last hospitalized at [facility] in March, reportedly had missed dialysis for 1 month prior to that -Hyperkalemia improved -Dialyzed yesterday, back on schedule for tomorrow  Bilateral lung opacities -Suspect this is secondary to fluid overload and less likely pneumonia, also had Covid earlier this year, could have residual  findings secondary to that -Gram-positive cocci in 1 out of 2 blood cultures likely contaminant -Discontinued antibiotics  Nephrolithiasis, neurogenic bladder -Chronic right nephrostomy drain -Last exchanged at [facility] on 02/24/2020 -IR consulted for nephrostomy exchange  Hyperphosphatemia -Likely noncompliant with phosphate binders, resume PhosLo 3 times daily with meals  Hypoglycemia -Resolved, hemoglobin A1c is 5  DVT prophylaxis: Subcutaneous heparin Code Status: Full code Family Communication: No family at bedside, called and updated patient's mother yesterday Disposition Plan:  Status is: Inpatient  Remains inpatient appropriate because:Inpatient level of care appropriate due to severity of illness   Dispo: The patient is from: Home              Anticipated d/c is to: To be determined, ideally needs SNF              Patient currently is not medically stable to d/c.   Difficult to place patient No  Consultants:   Nephrology   Procedures:   Antimicrobials:    Objective: Vitals:   09/07/20 0530 09/07/20 0550 09/07/20 0735 09/07/20 1121  BP:   127/83 (!) 142/90  Pulse:   96 92  Resp: '13 15 12 16  '$ Temp:   98.4 F (36.9 C) (!) 97.5 F (36.4 C)  TempSrc:   Oral   SpO2:   98% 99%  Weight:      Height:        Intake/Output Summary (Last 24 hours) at 09/07/2020 1439 Last data filed at 09/07/2020 1017 Gross per 24 hour  Intake 640 ml  Output 800 ml  Net -160 ml   Filed Weights   09/04/20 1945 09/07/20 0425  Weight: 54.4 kg 52.8 kg    Examination:  General exam: Chronically ill extremely disheveled young  male appears much older than stated age, awake alert oriented to self and partly to place only, angry and cursing at all staff members this morning CVS: S1-S2, regular rate rhythm Lungs: Clear anteriorly, would not let me auscultate posteriorly Abdomen: Soft, nontender, right nephrostomy drain noted Extremities: No edema ,  extremely scaly dry skin with long  curly nails, diminished pulses Psychiatry: Extremely poor insight and judgment  Data Reviewed:   CBC: Recent Labs  Lab 09/04/20 1950 09/05/20 0500 09/07/20 0552  WBC 10.1 9.7 7.1  NEUTROABS 9.5*  --   --   HGB 12.0* 11.7* 10.5*  HCT 39.4 36.0* 33.0*  MCV 102.6* 97.6 100.0  PLT 168 119* 78*   Basic Metabolic Panel: Recent Labs  Lab 09/04/20 1950 09/05/20 0658 09/07/20 0552  NA 145 140 139  K 7.2* 5.1 3.9  CL 107 92* 101  CO2 7* 26 24  GLUCOSE 38* 474* 80  BUN 150* 125* 40*  CREATININE 13.80* 11.77* 5.45*  CALCIUM 9.3 8.2* 8.0*  PHOS  --  11.3*  --    GFR: Estimated Creatinine Clearance: 11.8 mL/min (A) (by C-G formula based on SCr of 5.45 mg/dL (H)). Liver Function Tests: Recent Labs  Lab 09/04/20 1950 09/05/20 0658 09/07/20 0552  AST 38  --  27  ALT 49*  --  36  ALKPHOS 64  --  59  BILITOT 1.8*  --  1.4*  PROT 8.6*  --  6.6  ALBUMIN 3.5 2.9* 2.5*   No results for input(s): LIPASE, AMYLASE in the last 168 hours. No results for input(s): AMMONIA in the last 168 hours. Coagulation Profile: Recent Labs  Lab 09/05/20 0500  INR 1.6*   Cardiac Enzymes: No results for input(s): CKTOTAL, CKMB, CKMBINDEX, TROPONINI in the last 168 hours. BNP (last 3 results) No results for input(s): PROBNP in the last 8760 hours. HbA1C: Recent Labs    09/05/20 0500  HGBA1C 5.0   CBG: Recent Labs  Lab 09/04/20 2010 09/04/20 2144 09/04/20 2306 09/05/20 0200 09/07/20 1105  GLUCAP 87 110* 123* 144* 104*   Lipid Profile: No results for input(s): CHOL, HDL, LDLCALC, TRIG, CHOLHDL, LDLDIRECT in the last 72 hours. Thyroid Function Tests: No results for input(s): TSH, T4TOTAL, FREET4, T3FREE, THYROIDAB in the last 72 hours. Anemia Panel: No results for input(s): VITAMINB12, FOLATE, FERRITIN, TIBC, IRON, RETICCTPCT in the last 72 hours. Urine analysis:    Component Value Date/Time   COLORURINE YELLOW (A) 09/04/2020 1937   APPEARANCEUR CLOUDY (A) 09/04/2020 1937    APPEARANCEUR Turbid 09/19/2014 1914   LABSPEC 1.015 09/04/2020 1937   LABSPEC 1.011 09/19/2014 1914   PHURINE 8.0 09/04/2020 1937   GLUCOSEU 150 (A) 09/04/2020 1937   GLUCOSEU Negative 09/19/2014 1914   HGBUR MODERATE (A) 09/04/2020 [place] NEGATIVE 09/04/2020 1937   BILIRUBINUR Negative 09/19/2014 1914   KETONESUR NEGATIVE 09/04/2020 1937   PROTEINUR >=300 (A) 09/04/2020 1937   NITRITE NEGATIVE 09/04/2020 1937   LEUKOCYTESUR LARGE (A) 09/04/2020 1937   LEUKOCYTESUR 2+ 09/19/2014 1914   Sepsis Labs: '@LABRCNTIP'$ (procalcitonin:4,lacticidven:4)  ) Recent Results (from the past 240 hour(s))  Blood Culture (routine x 2)     Status: None (Preliminary result)   Collection Time: 09/04/20  7:50 PM   Specimen: BLOOD  Result Value Ref Range Status   Specimen Description   Final    BLOOD BLOOD LEFT FOREARM Performed at [facility], [address], [place], [place] [address]    Special Requests   Final  BOTTLES DRAWN AEROBIC AND ANAEROBIC Blood Culture adequate volume Performed at [facility], [address] [place], [place] [address]    Culture  Setup Time   Final    GRAM POSITIVE RODS AEROBIC BOTTLE ONLY CRITICAL RESULT CALLED TO, READ BACK BY AND VERIFIED WITH: Winfield Rast PATEL AT 2359 09/05/20 MF    Culture GRAM POSITIVE RODS  Final   Report Status PENDING  Incomplete  Resp Panel by RT-PCR (Flu A&B, Covid) Nasopharyngeal Swab     Status: None   Collection Time: 09/04/20 10:52 PM   Specimen: Nasopharyngeal Swab; Nasopharyngeal(NP) swabs in vial transport medium  Result Value Ref Range Status   SARS Coronavirus 2 by RT PCR NEGATIVE NEGATIVE Final    Comment: (NOTE) SARS-CoV-2 target nucleic acids are NOT DETECTED.  The SARS-CoV-2 RNA is generally detectable in upper respiratory specimens during the acute phase of infection. The lowest concentration of SARS-CoV-2 viral copies this assay can detect is 138 copies/mL. A negative result does not  preclude SARS-Cov-2 infection and should not be used as the sole basis for treatment or other patient management decisions. A negative result may occur with  improper specimen collection/handling, submission of specimen other than nasopharyngeal swab, presence of viral mutation(s) within the areas targeted by this assay, and inadequate number of viral copies(<138 copies/mL). A negative result must be combined with clinical observations, patient history, and epidemiological information. The expected result is Negative.  Fact Sheet for Patients:  EntrepreneurPulse.com.au  Fact Sheet for Healthcare Providers:  IncredibleEmployment.be  This test is no t yet approved or cleared by the [place] FDA and  has been authorized for detection and/or diagnosis of SARS-CoV-2 by FDA under an Emergency Use Authorization (EUA). This EUA will remain  in effect (meaning this test can be used) for the duration of the COVID-19 declaration under Section 564(b)(1) of the Act, 21 U.S.C.section 360bbb-3(b)(1), unless the authorization is terminated  or revoked sooner.       Influenza A by PCR NEGATIVE NEGATIVE Final   Influenza B by PCR NEGATIVE NEGATIVE Final    Comment: (NOTE) The Xpert Xpress SARS-CoV-2/FLU/RSV plus assay is intended as an aid in the diagnosis of influenza from Nasopharyngeal swab specimens and should not be used as a sole basis for treatment. Nasal washings and aspirates are unacceptable for Xpert Xpress SARS-CoV-2/FLU/RSV testing.  Fact Sheet for Patients: EntrepreneurPulse.com.au  Fact Sheet for Healthcare Providers: IncredibleEmployment.be  This test is not yet approved or cleared by the [place] FDA and has been authorized for detection and/or diagnosis of SARS-CoV-2 by FDA under an Emergency Use Authorization (EUA). This EUA will remain in effect (meaning this test can be used) for the  duration of the COVID-19 declaration under Section 564(b)(1) of the Act, 21 U.S.C. section 360bbb-3(b)(1), unless the authorization is terminated or revoked.  Performed at [facility], [address], [place], [place] [address]     Radiology Studies: No results found. Scheduled Meds: . albuterol  2.5 mg Nebulization Once  . heparin  5,000 Units Subcutaneous Q8H  . sevelamer carbonate  2,400 mg Oral TID WC  . sodium chloride flush  3 mL Intravenous Q12H   Continuous Infusions:   LOS: 3 days   Time spent: 91mn  PDomenic Polite MD [facility] 09/07/2020, 2:39 PM

## 2020-09-08 DIAGNOSIS — Z515 Encounter for palliative care: Secondary | ICD-10-CM

## 2020-09-08 DIAGNOSIS — Z87820 Personal history of traumatic brain injury: Secondary | ICD-10-CM | POA: Diagnosis not present

## 2020-09-08 LAB — BASIC METABOLIC PANEL
Anion gap: 15 (ref 5–15)
BUN: 51 mg/dL — ABNORMAL HIGH (ref 6–20)
CO2: 23 mmol/L (ref 22–32)
Calcium: 7.9 mg/dL — ABNORMAL LOW (ref 8.9–10.3)
Chloride: 102 mmol/L (ref 98–111)
Creatinine, Ser: 6.77 mg/dL — ABNORMAL HIGH (ref 0.61–1.24)
GFR, Estimated: 9 mL/min — ABNORMAL LOW (ref >60)
Glucose, Bld: 80 mg/dL (ref 70–99)
Potassium: 4.3 mmol/L (ref 3.5–5.1)
Sodium: 140 mmol/L (ref 135–145)

## 2020-09-08 LAB — GLUCOSE, CAPILLARY
Glucose-Capillary: 62 mg/dL — ABNORMAL LOW (ref 70–99)
Glucose-Capillary: 114 mg/dL — ABNORMAL HIGH (ref 70–99)
Glucose-Capillary: 83 mg/dL (ref 70–99)

## 2020-09-08 LAB — CBC
HCT: 35.7 % — ABNORMAL LOW (ref 39.0–52.0)
Hemoglobin: 11 g/dL — ABNORMAL LOW (ref 13.0–17.0)
MCH: 31 pg (ref 26.0–34.0)
MCHC: 30.8 g/dL (ref 30.0–36.0)
MCV: 100.6 fL — ABNORMAL HIGH (ref 80.0–100.0)
Platelets: 89 10*3/uL — ABNORMAL LOW (ref 150–400)
RBC: 3.55 MIL/uL — ABNORMAL LOW (ref 4.22–5.81)
RDW: 18.1 % — ABNORMAL HIGH (ref 11.5–15.5)
WBC: 5.9 10*3/uL (ref 4.0–10.5)
nRBC: 0 % (ref 0.0–0.2)

## 2020-09-08 LAB — PHOSPHORUS: Phosphorus: 6.8 mg/dL — ABNORMAL HIGH (ref 2.5–4.6)

## 2020-09-08 NOTE — NC FL2 (Signed)
[place] LEVEL OF CARE SCREENING TOOL     IDENTIFICATION  Patient Name: Larry Morrison Birthdate: October 30, 1970 Sex: male Admission Date (Current Location): 09/04/2020  [facility] and [place] Number:  Engineering geologist and Address:         Provider Number: [id number]  Attending Physician Name and Address:  Sidney Ace, MD  Relative Name and Phone Number:       Current Level of Care: Hospital Recommended Level of Care: [place] Prior Approval Number:    Date Approved/Denied:   PASRR Number: [id number]  Discharge Plan: SNF    Current Diagnoses: Patient Active Problem List   Diagnosis Date Noted  . History of traumatic brain injury 09/07/2020  . Cognitive impairment 09/07/2020  . Metabolic encephalopathy 0000000  . ESRD needing dialysis ([place]) 09/04/2020  . Hypoglycemia 09/04/2020  . Uremia 09/04/2020  . COVID-19 virus infection 06/18/2020  . Stroke ([place]) 06/18/2020  . Thrombocytopenia ([place]) 06/18/2020  . Elevated troponin 06/18/2020  . Hyperkalemia 06/13/2020  . Anemia in ESRD (end-stage renal disease) ([place]) 09/17/2019  . Essential hypertension 07/15/2019  . Testicular torsion   . Pyuria 06/10/2016  . Chronic pain following surgery or procedure 12/01/2015  . 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[place]) 12/01/2015  . Tobacco use disorder 12/01/2015  . ESRD (end stage renal disease) ([place]) 04/28/2014  . Obstructed nephrostomy tube ([place]) 10/23/2013  . 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 Neurogenic bladder 02/09/1998  . Urinary calculus 02/09/1998  . 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    Orientation RESPIRATION BLADDER Height & Weight     Self,Time,Situation,Place  Normal Continent Weight: 52.8 kg Height:  '5\' 4"'$  (162.6 cm)  BEHAVIORAL SYMPTOMS/MOOD NEUROLOGICAL BOWEL NUTRITION STATUS  Verbally abusive   Incontinent Diet (Renal fluid restriced 1200 ml)  AMBULATORY STATUS COMMUNICATION OF  NEEDS Skin   Extensive Assist Verbally Other (Comment) (Nephrostomy tube)                       Personal Care Assistance Level of Assistance              Functional Limitations Info             SPECIAL CARE FACTORS FREQUENCY  PT (By licensed PT),OT (By licensed OT)                    Contractures Contractures Info: Not present    Additional Factors Info  Code Status,Allergies Code Status Info: DNR Allergies Info: Vancsomycin           Current Medications (09/08/2020):  This is the current hospital active medication list Current Facility-Administered Medications  Medication Dose Route Frequency Provider Last Rate Last Admin  . acetaminophen (TYLENOL) tablet 650 mg  650 mg Oral Q6H PRN Lenore Cordia, MD       Or  . acetaminophen (TYLENOL) suppository 650 mg  650 mg Rectal Q6H PRN Zada Finders R, MD      . albuterol (PROVENTIL) (2.5 MG/3ML) 0.083% nebulizer solution 2.5 mg  2.5 mg Nebulization Once Nance Pear, MD      . heparin injection 5,000 Units  5,000 Units Subcutaneous Q8H Lenore Cordia, MD   5,000 Units at 09/07/20 0539  . ondansetron (ZOFRAN) tablet 4 mg  4 mg Oral Q6H PRN Lenore Cordia, MD       Or  . ondansetron (ZOFRAN) injection 4 mg  4 mg Intravenous Q6H PRN Lenore Cordia,  MD      . senna-docusate (Senokot-S) tablet 1 tablet  1 tablet Oral QHS PRN Zada Finders R, MD      . sevelamer carbonate (RENVELA) tablet 2,400 mg  2,400 mg Oral TID WC Kolluru, Sarath, MD      . sodium chloride flush (NS) 0.9 % injection 3 mL  3 mL Intravenous Q12H Lenore Cordia, MD   3 mL at 09/06/20 0107     Discharge Medications: Please see discharge summary for a list of discharge medications.  Relevant Imaging Results:  Relevant Lab Results:   Additional Information ss [id number]  Beverly Sessions, RN

## 2020-09-08 NOTE — Progress Notes (Signed)
PROGRESS NOTE    Larry Morrison  [id number] DOB: 05-Sep-1970 DOA: 09/04/2020 PCP: [facility]  Brief Narrative:  Chronically ill noncompliant 50 year old African-American male with history of CVA, ESRD on hemodialysis, chronic right nephrostomy drain, nephrolithiasis, neurogenic bladder who was brought to the ED 4/16, EMS was called as family found him altered, when EMS arrived patient was covered in feces urine and maggots. -Very poor, limited historian -In the ED he was noted to be hypertensive, tachypneic, O2 sats dropped to 87%, labs were notable for potassium of 7.2, bicarb of 7, BUN of 150, chest x-ray noted bilateral lower lobe opacities left greater than right with probable small effusions. -Previously admitted to [facility] in March after missing hemodialysis for 3 to 4 weeks -Patient has been agitated and angry, refusing nursing care and cursing at staff including palliative care and psychiatry consult -When palliative care discussed with the patient's mother she requested transfer to [facility] as the physicians know him there however at this point there is no medically justifiable reason for transfer -When I discussed with the patient he says he only wants to go home and has no interest in any rehab or nursing facility   Assessment & Plan:   Principal Problem:   History of traumatic brain injury Active Problems:   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[place])   Neurogenic bladder   Hyperkalemia   ESRD needing dialysis ([place])   Hypoglycemia   Uremia   Metabolic encephalopathy   Cognitive impairment  Metabolic encephalopathy Uremia Noncompliance -Initially was encephalopathic which was primarily secondary to uremia, urine also appears abnormal and he has a right nephrostomy drain which likely has not been managed safely -BUN down to 51 from 150 on admission -Awake and alert now, cursing, with some cognitive deficits -PT OT, ideally needs SNF.  SNF not an option at this point, and  patient refusing anyways -Called patient's mother and emphasized need for closer supervision,  she knows he needs dialysis to survive and wishes [place] to do everything to keep him alive -Psychiatry consulted for capacity evaluation however patient was extremely agitated and cursing and psychiatry consult -Main issue is noncompliance with dialysis, palliative consulted -Nephrology following for inpatient hemodialysis Plan: Nephrology for inpatient HD Defer nephrostomy exchange for now   Hyperkalemia ESRD on hemodialysis -Estimated dry weight is 50.5 kg per DC summary from [facility] -Missed HD for about 2-3 weeks now, was last hospitalized at [facility] in March, reportedly had missed dialysis for 1 month prior to that -Hyperkalemia improved -Dialyzed 4/18 - Plan: HD today 4/20 Nephrology followup for inpatient HD  Bilateral lung opacities -Suspect this is secondary to fluid overload and less likely pneumonia, also had Covid earlier this year, could have residual findings secondary to that -Gram-positive cocci in 1 out of 2 blood cultures likely contaminant -Discontinued antibiotics  Nephrolithiasis, neurogenic bladder -Chronic right nephrostomy drain -Last exchanged at [facility] on 02/24/2020 -Defer nephrostomy exchange for now as tube is functioning appropriately and renal indices improved from admission  Hyperphosphatemia -Likely noncompliant with phosphate binders, resume PhosLo 3 times daily with meals  Hypoglycemia -Resolved, hemoglobin A1c is 5   DVT prophylaxis: SQ heparin Code Status: FULL Family Communication: None today.  Patient's mother was updated on plan of care by palliative care consult Disposition Plan: Status is: Inpatient  Remains inpatient appropriate because:Altered mental status, Unsafe d/c plan and Inpatient level of care appropriate due to severity of illness   Dispo: The patient is from: Home  Anticipated d/c is to: SNF vs home with home health               Patient currently is not medically stable to d/c.   Difficult to place patient Yes  Complex patient, ESRD.  Presentation complicated by very poor self-care at home.  Patient adamantly refusing skilled nursing facility however does not have good care plan in place at home.  Disposition plan unclear.  We will continue discussion with TOC.     Level of care: Progressive Cardiac  Consultants:   Nephrology  Psychiatry  Palliative care  Procedures: None Antimicrobials:   None   Subjective: Patient seen and examined.  Calm and cooperative but does not volunteer too much extra information.  No pain complaints.  Adamantly refusing skilled nursing facility placement.  Objective: Vitals:   09/08/20 1015 09/08/20 1030 09/08/20 1045 09/08/20 1100  BP: 134/79 (!) 150/83 136/79 140/82  Pulse: 90 89 88 92  Resp: (!) 22 (!) 25 (!) 25 (!) 26  Temp:      TempSrc:      SpO2:      Weight:      Height:        Intake/Output Summary (Last 24 hours) at 09/08/2020 1121 Last data filed at 09/07/2020 1848 Gross per 24 hour  Intake 120 ml  Output --  Net 120 ml   Filed Weights   09/04/20 1945 09/07/20 0425  Weight: 54.4 kg 52.8 kg    Examination:  General exam: No acute distress.  Appears frail and chronically ill Respiratory system: Coarse breath sounds bilaterally.  Normal work of breathing.  Room air Cardiovascular system: S1-S2, regular rate and rhythm,+ murmur Gastrointestinal system: Thin, soft, nontender/nondistended, right nephrostomy drain Central nervous system: Alert, oriented times 2, no focal cranial nerve deficits  extremities: Muscle wasting noted bilaterally.  Decreased power diffusely.  Weak pulses bilateral lower extremities Skin: Scattered excoriations, dry scaly skin Psychiatry: Judgment and insight extremely poor.  Mood and affect agitated    Data Reviewed: I have personally reviewed following labs and imaging studies  CBC: Recent Labs  Lab  09/04/20 1950 09/05/20 0500 09/07/20 0552 09/08/20 0806  WBC 10.1 9.7 7.1 5.9  NEUTROABS 9.5*  --   --   --   HGB 12.0* 11.7* 10.5* 11.0*  HCT 39.4 36.0* 33.0* 35.7*  MCV 102.6* 97.6 100.0 100.6*  PLT 168 119* 78* 89*   Basic Metabolic Panel: Recent Labs  Lab 09/04/20 1950 09/05/20 0658 09/07/20 0552 09/08/20 0806  NA 145 140 139 140  K 7.2* 5.1 3.9 4.3  CL 107 92* 101 102  CO2 7* '26 24 23  '$ GLUCOSE 38* 474* 80 80  BUN 150* 125* 40* 51*  CREATININE 13.80* 11.77* 5.45* 6.77*  CALCIUM 9.3 8.2* 8.0* 7.9*  PHOS  --  11.3*  --   --    GFR: Estimated Creatinine Clearance: 9.5 mL/min (A) (by C-G formula based on SCr of 6.77 mg/dL (H)). Liver Function Tests: Recent Labs  Lab 09/04/20 1950 09/05/20 0658 09/07/20 0552  AST 38  --  27  ALT 49*  --  36  ALKPHOS 64  --  59  BILITOT 1.8*  --  1.4*  PROT 8.6*  --  6.6  ALBUMIN 3.5 2.9* 2.5*   No results for input(s): LIPASE, AMYLASE in the last 168 hours. No results for input(s): AMMONIA in the last 168 hours. Coagulation Profile: Recent Labs  Lab 09/05/20 0500  INR 1.6*   Cardiac Enzymes:  No results for input(s): CKTOTAL, CKMB, CKMBINDEX, TROPONINI in the last 168 hours. BNP (last 3 results) No results for input(s): PROBNP in the last 8760 hours. HbA1C: No results for input(s): HGBA1C in the last 72 hours. CBG: Recent Labs  Lab 09/04/20 2306 09/05/20 0200 09/07/20 1105 09/08/20 0737 09/08/20 0842  GLUCAP 123* 144* 104* 62* 114*   Lipid Profile: No results for input(s): CHOL, HDL, LDLCALC, TRIG, CHOLHDL, LDLDIRECT in the last 72 hours. Thyroid Function Tests: No results for input(s): TSH, T4TOTAL, FREET4, T3FREE, THYROIDAB in the last 72 hours. Anemia Panel: No results for input(s): VITAMINB12, FOLATE, FERRITIN, TIBC, IRON, RETICCTPCT in the last 72 hours. Sepsis Labs: Recent Labs  Lab 09/04/20 1950 09/04/20 2235 09/05/20 0500  PROCALCITON  --   --  3.63  LATICACIDVEN 10.1* 3.1* 5.1*    Recent  Results (from the past 240 hour(s))  Blood Culture (routine x 2)     Status: None (Preliminary result)   Collection Time: 09/04/20  7:50 PM   Specimen: BLOOD  Result Value Ref Range Status   Specimen Description   Final    BLOOD BLOOD LEFT FOREARM Performed at [facility], [address], [place], [place] [address]    Special Requests   Final    BOTTLES DRAWN AEROBIC AND ANAEROBIC Blood Culture adequate volume Performed at [facility], [address], [place], [place] [address]    Culture  Setup Time   Final    GRAM POSITIVE RODS AEROBIC BOTTLE ONLY CRITICAL RESULT CALLED TO, READ BACK BY AND VERIFIED WITH: Winfield Rast PATEL AT 2359 09/05/20 MF    Culture   Final    GRAM POSITIVE RODS IDENTIFICATION TO FOLLOW Performed at [facility], [place] [address] [place], [place] [address]    Report Status PENDING  Incomplete  Resp Panel by RT-PCR (Flu A&B, Covid) Nasopharyngeal Swab     Status: None   Collection Time: 09/04/20 10:52 PM   Specimen: Nasopharyngeal Swab; Nasopharyngeal(NP) swabs in vial transport medium  Result Value Ref Range Status   SARS Coronavirus 2 by RT PCR NEGATIVE NEGATIVE Final    Comment: (NOTE) SARS-CoV-2 target nucleic acids are NOT DETECTED.  The SARS-CoV-2 RNA is generally detectable in upper respiratory specimens during the acute phase of infection. The lowest concentration of SARS-CoV-2 viral copies this assay can detect is 138 copies/mL. A negative result does not preclude SARS-Cov-2 infection and should not be used as the sole basis for treatment or other patient management decisions. A negative result may occur with  improper specimen collection/handling, submission of specimen other than nasopharyngeal swab, presence of viral mutation(s) within the areas targeted by this assay, and inadequate number of viral copies(<138 copies/mL). A negative result must be combined with clinical observations, patient history, and  epidemiological information. The expected result is Negative.  Fact Sheet for Patients:  EntrepreneurPulse.com.au  Fact Sheet for Healthcare Providers:  IncredibleEmployment.be  This test is no t yet approved or cleared by the [place] FDA and  has been authorized for detection and/or diagnosis of SARS-CoV-2 by FDA under an Emergency Use Authorization (EUA). This EUA will remain  in effect (meaning this test can be used) for the duration of the COVID-19 declaration under Section 564(b)(1) of the Act, 21 U.S.C.section 360bbb-3(b)(1), unless the authorization is terminated  or revoked sooner.       Influenza A by PCR NEGATIVE NEGATIVE Final   Influenza B by PCR NEGATIVE NEGATIVE Final    Comment: (NOTE) The  Xpert Xpress SARS-CoV-2/FLU/RSV plus assay is intended as an aid in the diagnosis of influenza from Nasopharyngeal swab specimens and should not be used as a sole basis for treatment. Nasal washings and aspirates are unacceptable for Xpert Xpress SARS-CoV-2/FLU/RSV testing.  Fact Sheet for Patients: EntrepreneurPulse.com.au  Fact Sheet for Healthcare Providers: IncredibleEmployment.be  This test is not yet approved or cleared by the [place] FDA and has been authorized for detection and/or diagnosis of SARS-CoV-2 by FDA under an Emergency Use Authorization (EUA). This EUA will remain in effect (meaning this test can be used) for the duration of the COVID-19 declaration under Section 564(b)(1) of the Act, 21 U.S.C. section 360bbb-3(b)(1), unless the authorization is terminated or revoked.  Performed at [facility], [address] [place],  [address]          Radiology Studies: No results found.      Scheduled Meds: . albuterol  2.5 mg Nebulization Once  . heparin  5,000 Units Subcutaneous Q8H  . sevelamer carbonate  2,400 mg Oral TID WC  . sodium chloride  flush  3 mL Intravenous Q12H   Continuous Infusions:   LOS: 4 days    Time spent: 25 minutes    Sidney Ace, MD [facility] Pager [phone]  If 7PM-7AM, please contact night-coverage 09/08/2020, 11:21 AM

## 2020-09-08 NOTE — Progress Notes (Signed)
   09/08/20 1453  Vitals  Pulse Rate Source Monitor  ECG Heart Rate 86  Resp 18  MEWS COLOR  MEWS Score Color Green  Oxygen Therapy  O2 Device Room Air  MEWS Score  MEWS Temp 0  MEWS Systolic 0  MEWS Pulse 0  MEWS RR 0  MEWS LOC 0  MEWS Score 0    Pt refusing care. Vitals are from tele monitor.  Pt refused vitals stating " They just did that sh*t" and swatted his hand at me. Pt also refusing medication and SQ heparin.  Pt educated on refusing care. Pt continues to be verbally aggressive. MD is aware. Will continue to monitor

## 2020-09-08 NOTE — Progress Notes (Addendum)
Daily Progress Note   Patient Name: Larry Morrison       Date: 09/08/2020 DOB: 1970/12/22  Age: 50 y.o. MRN#: [id number] Attending Physician: Larry Ace, MD Primary Care Physician: [facility] Admit Date: 09/04/2020  Reason for Consultation/Follow-up: Establishing goals of care  Subjective: Patient evaluated by psychiatry; patient currently needs assistance with decision making. Spoke with his mother. 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 She states he had 4 jobs and worked all the time. She states he has 3 children, 2 daughters and 1 son.   She states now he lives alone. We discussed his dialysis. She states he tells her he goes to treatments and then she finds out he has not; she states she tells him he needs to go. She states "I tell him to take a bath and he won't. I have to tell him yes you are, and make him!" She states he argues and curses and states "that's him, that's Larry Morrison. That's what he does." She states multiple times that "he knows what he needs to do."   She states she feels he needs to do dialysis to keep him alive, and also states she understands he cannot be made to do what he does not want to do. She understands he is refusing care and cursing at staff. She states "I told him not to be doing that."  Discussed the difference between an aggressive path vs a comfort path.  She states she would like whatever care he needs to "keep him alive". Attempted to discuss care beyond this hospitalization. She again states "he knows what he needs to do, and he just needs to do it."   She states in regards to his behavior, he is only acting the way he is, because he is scared to be here at this hospital. She tells me she wanted him to go to  [facility] because that is where his doctors are and they know how to take care of him, and get him back on his feet. During the conversation she also states he was supposed to follow up with [facility] to get his tube changed, but has not done so, though she has told him multiple times he needs to return for the tube exchange.  She states she wants him transferred  to [facility] and if she had a car she would pick him up and take him herself.   Updated staff. Called patient's mother back to inquire contact information for his children. Patient yesterday stated he had 1 child, a daughter. After explaining his statement of only having one child yesterday, she stated he does have only 1 child. Inquired her name, and after a few moments she stated her name is Larry Morrison. She is unsure of the last name and states she has not seen her since she was a baby. TOC, attending MD, and nephrology aware.    Length of Stay: 4  Current Medications: Scheduled Meds:  . albuterol  2.5 mg Nebulization Once  . heparin  5,000 Units Subcutaneous Q8H  . sevelamer carbonate  2,400 mg Oral TID WC  . sodium chloride flush  3 mL Intravenous Q12H    Continuous Infusions:   PRN Meds: acetaminophen **OR** acetaminophen, ondansetron **OR** ondansetron (ZOFRAN) IV, senna-docusate  Physical Exam Pulmonary:     Effort: Pulmonary effort is normal.  Neurological:     Mental Status: He is alert.             Vital Signs: BP (!) 150/92 (BP Location: Left Arm)   Pulse 90   Temp 98 F (36.7 C) (Oral)   Resp 18   Ht '5\' 4"'$  (1.626 m)   Wt 52.8 kg   SpO2 100%   BMI 19.98 kg/m  SpO2: SpO2: 100 % O2 Device: O2 Device: Room Air O2 Flow Rate: O2 Flow Rate (L/min): 2 L/min  Intake/output summary:   Intake/Output Summary (Last 24 hours) at 09/08/2020 [id number] Last data filed at 09/07/2020 1848 Gross per 24 hour  Intake 300 ml  Output --  Net 300 ml   LBM: Last BM Date: 09/06/20 Baseline Weight: Weight: 54.4 kg Most recent  weight: Weight: 52.8 kg        Patient Active Problem List   Diagnosis Date Noted  . History of traumatic brain injury 09/07/2020  . Cognitive impairment 09/07/2020  . Metabolic encephalopathy 0000000  . ESRD needing dialysis ([place]) 09/04/2020  . Hypoglycemia 09/04/2020  . Uremia 09/04/2020  . COVID-19 virus infection 06/18/2020  . Stroke ([place]) 06/18/2020  . Thrombocytopenia ([place]) 06/18/2020  . Elevated troponin 06/18/2020  . Hyperkalemia 06/13/2020  . Anemia in ESRD (end-stage renal disease) ([place]) 09/17/2019  . Essential hypertension 07/15/2019  . Testicular torsion   . Pyuria 06/10/2016  . Chronic pain following surgery or procedure 12/01/2015  . 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[place]) 12/01/2015  . Tobacco use disorder 12/01/2015  . ESRD (end stage renal disease) ([place]) 04/28/2014  . Obstructed nephrostomy tube ([place]) 10/23/2013  . 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 Neurogenic bladder 02/09/1998  . Urinary calculus 02/09/1998  . 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    Palliative Care Assessment & Plan    Recommendations/Plan: Mother would like patient transferred to [facility], and to provide what care is needed to keep him alive.     Code Status:    Code Status Orders  (From admission, onward)         Start     Ordered   09/04/20 2353  Full code  Continuous        09/04/20 2355        Code Status History    Date Active Date Inactive Code Status Order ID Comments User Context   06/18/2020 1417 06/20/2020 1847 Full Code [id number]  Ivor Costa, MD  ED   06/13/2020 1338 06/15/2020 1522 Full Code [id number]  Collier Bullock, MD ED   Advance Care Planning Activity      Prognosis:  Poor   Care plan was discussed with [facility], attending MD, and nephrology aware.   Thank you for allowing the Palliative Medicine Team to assist in the care of this patient.   Total Time 35 min Prolonged Time Billed  no      Greater than  50%  of this time was spent counseling and coordinating care related to the above assessment and plan.  Asencion Gowda, NP  Please contact Palliative Medicine Team phone at [phone] for questions and concerns.

## 2020-09-08 NOTE — Consult Note (Signed)
[place] Psychiatry Consult   Reason for Consult: Follow-up on yesterday's consult for this 50 year old man with multiple medical problems Referring Physician:  Priscella Mann Patient Identification: Larry Morrison MRN:  [id number] Principal Diagnosis: History of traumatic brain injury Diagnosis:  Principal Problem:   History of traumatic brain injury Active Problems:   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[place])   Neurogenic bladder   Hyperkalemia   ESRD needing dialysis ([place])   Hypoglycemia   Uremia   Metabolic encephalopathy   Cognitive impairment   Total Time spent with patient: 20 minutes  Subjective:   Larry Morrison is a 50 y.o. male patient admitted with "goodbye"  HPI: Stopped by this afternoon to see the patient to see if his mental status had changed any.  Came into the room and spoke the patient's name quietly.  He woke up easily.  Immediately told me goodbye.  I went on to introduce myself and he said it a few more times loudly.  Interview terminated.  Past Psychiatric History: See previous notes.  History of head injuries specifics unclear  Risk to Self:   Risk to Others:   Prior Inpatient Therapy:   Prior Outpatient Therapy:    Past Medical History:  Past Medical History:  Diagnosis Date  . Anemia   . 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 Dialysis patient ([place])   . Dyspnea   . Foot drop   . History of kidney stones   . History of nephrostomy   . Hypertension   . Renal disorder   . Stroke [facility])     Past Surgical History:  Procedure Laterality Date  . AV FISTULA PLACEMENT Right 08/07/2019   Procedure: ARTERIOVENOUS (AV) FISTULA CREATION;  Surgeon: Algernon Huxley, MD;  Location: [facility] ORS;  Service: Vascular;  Laterality: Right;  . AV FISTULA PLACEMENT Right 11/20/2019   Procedure: INSERTION OF ARTERIOVENOUS (AV) GORE-TEX GRAFT ARM;  Surgeon: Algernon Huxley, MD;  Location: [facility] ORS;  Service: Vascular;  Laterality: Right;  . NEPHRECTOMY Left   . NEPHRECTOMY    . ORCHIECTOMY  Right 10/27/2016   Procedure: PSB ORCHIECTOMY;  Surgeon: Hollice Espy, MD;  Location: [facility] ORS;  Service: Urology;  Laterality: Right;  . ORCHIOPEXY Bilateral 10/27/2016   Procedure: ORCHIOPEXY ADULT;  Surgeon: Hollice Espy, MD;  Location: [facility] ORS;  Service: Urology;  Laterality: Bilateral;  . SCROTAL EXPLORATION Bilateral 10/27/2016   Procedure: SCROTUM EXPLORATION;  Surgeon: Hollice Espy, MD;  Location: [facility] ORS;  Service: Urology;  Laterality: Bilateral;   Family History:  Family History  Family history unknown: Yes   Family Psychiatric  History: See previous Social History:  Social History   Substance and Sexual Activity  Alcohol Use No     Social History   Substance and Sexual Activity  Drug Use Yes  . Types: Marijuana   Comment: today    Social History   Socioeconomic History  . Marital status: Single    Spouse name: Not on file  . Number of children: Not on file  . Years of education: Not on file  . Highest education level: Not on file  Occupational History  . Not on file  Tobacco Use  . Smoking status: Former Smoker    Packs/day: 0.50    Types: Cigarettes  . Smokeless tobacco: Never Used  Vaping Use  . Vaping Use: Never used  Substance and Sexual Activity  . Alcohol use: No  . Drug use: Yes    Types: Marijuana  Comment: today  . Sexual activity: Not Currently  Other Topics Concern  . Not on file  Social History Narrative  . Not on file   Social Determinants of Health   Financial Resource Strain: Not on file  Food Insecurity: Not on file  Transportation Needs: Not on file  Physical Activity: Not on file  Stress: Not on file  Social Connections: Not on file   Additional Social History:    Allergies:   Allergies  Allergen Reactions  . Vancomycin     Patient denies - repeated denial to pharm tech 09-05-2020    Labs:  Results for orders placed or performed during the hospital encounter of 09/04/20 (from the past 48 hour(s))  CBC      Status: Abnormal   Collection Time: 09/07/20  5:52 AM  Result Value Ref Range   WBC 7.1 4.0 - 10.5 K/uL   RBC 3.30 (L) 4.22 - 5.81 MIL/uL   Hemoglobin 10.5 (L) 13.0 - 17.0 g/dL   HCT 33.0 (L) 39.0 - 52.0 %   MCV 100.0 80.0 - 100.0 fL   MCH 31.8 26.0 - 34.0 pg   MCHC 31.8 30.0 - 36.0 g/dL   RDW 18.3 (H) 11.5 - 15.5 %   Platelets 78 (L) 150 - 400 K/uL    Comment: Immature Platelet Fraction may be clinically indicated, consider ordering this additional test [id number] PLATELET COUNT CONFIRMED BY SMEAR    nRBC 0.3 (H) 0.0 - 0.2 %    Comment: Performed at [facility], [address], [place], [place] [address]  Comprehensive metabolic panel     Status: Abnormal   Collection Time: 09/07/20  5:52 AM  Result Value Ref Range   Sodium 139 135 - 145 mmol/L   Potassium 3.9 3.5 - 5.1 mmol/L   Chloride 101 98 - 111 mmol/L   CO2 24 22 - 32 mmol/L   Glucose, Bld 80 70 - 99 mg/dL    Comment: Glucose reference range applies only to samples taken after fasting for at least 8 hours.   BUN 40 (H) 6 - 20 mg/dL   Creatinine, Ser 5.45 (H) 0.61 - 1.24 mg/dL   Calcium 8.0 (L) 8.9 - 10.3 mg/dL   Total Protein 6.6 6.5 - 8.1 g/dL   Albumin 2.5 (L) 3.5 - 5.0 g/dL   AST 27 15 - 41 U/L   ALT 36 0 - 44 U/L   Alkaline Phosphatase 59 38 - 126 U/L   Total Bilirubin 1.4 (H) 0.3 - 1.2 mg/dL   GFR, Estimated 12 (L) >60 mL/min    Comment: (NOTE) Calculated using the CKD-EPI Creatinine Equation (2021)    Anion gap 14 5 - 15    Comment: Performed at [facility], [address] [place], [place] [address]  Glucose, capillary     Status: Abnormal   Collection Time: 09/07/20 11:05 AM  Result Value Ref Range   Glucose-Capillary 104 (H) 70 - 99 mg/dL    Comment: Glucose reference range applies only to samples taken after fasting for at least 8 hours.   Comment 1 Notify RN    Comment 2 Document in Chart   Glucose, capillary     Status: Abnormal   Collection Time: 09/08/20  7:37 AM  Result  Value Ref Range   Glucose-Capillary 62 (L) 70 - 99 mg/dL    Comment: Glucose reference range applies only to samples taken after fasting for at least 8 hours.  CBC     Status:  Abnormal   Collection Time: 09/08/20  8:06 AM  Result Value Ref Range   WBC 5.9 4.0 - 10.5 K/uL   RBC 3.55 (L) 4.22 - 5.81 MIL/uL   Hemoglobin 11.0 (L) 13.0 - 17.0 g/dL   HCT 35.7 (L) 39.0 - 52.0 %   MCV 100.6 (H) 80.0 - 100.0 fL   MCH 31.0 26.0 - 34.0 pg   MCHC 30.8 30.0 - 36.0 g/dL   RDW 18.1 (H) 11.5 - 15.5 %   Platelets 89 (L) 150 - 400 K/uL    Comment: Immature Platelet Fraction may be clinically indicated, consider ordering this additional test [id number]    nRBC 0.0 0.0 - 0.2 %    Comment: Performed at [facility], [address] [place], [place] [id number]  Basic metabolic panel     Status: Abnormal   Collection Time: 09/08/20  8:06 AM  Result Value Ref Range   Sodium 140 135 - 145 mmol/L   Potassium 4.3 3.5 - 5.1 mmol/L   Chloride 102 98 - 111 mmol/L   CO2 23 22 - 32 mmol/L   Glucose, Bld 80 70 - 99 mg/dL    Comment: Glucose reference range applies only to samples taken after fasting for at least 8 hours.   BUN 51 (H) 6 - 20 mg/dL   Creatinine, Ser 6.77 (H) 0.61 - 1.24 mg/dL   Calcium 7.9 (L) 8.9 - 10.3 mg/dL   GFR, Estimated 9 (L) >60 mL/min    Comment: (NOTE) Calculated using the CKD-EPI Creatinine Equation (2021)    Anion gap 15 5 - 15    Comment: Performed at [facility], [address] [place], [place] [address]  Phosphorus     Status: Abnormal   Collection Time: 09/08/20  8:06 AM  Result Value Ref Range   Phosphorus 6.8 (H) 2.5 - 4.6 mg/dL    Comment: Performed at [facility], [address] [place], [place] [address]  Glucose, capillary     Status: Abnormal   Collection Time: 09/08/20  8:42 AM  Result Value Ref Range   Glucose-Capillary 114 (H) 70 - 99 mg/dL    Comment: Glucose reference range applies only to samples taken after fasting for at  least 8 hours.    Current Facility-Administered Medications  Medication Dose Route Frequency Provider Last Rate Last Admin  . acetaminophen (TYLENOL) tablet 650 mg  650 mg Oral Q6H PRN Lenore Cordia, MD       Or  . acetaminophen (TYLENOL) suppository 650 mg  650 mg Rectal Q6H PRN Zada Finders R, MD      . albuterol (PROVENTIL) (2.5 MG/3ML) 0.083% nebulizer solution 2.5 mg  2.5 mg Nebulization Once Nance Pear, MD      . heparin injection 5,000 Units  5,000 Units Subcutaneous Q8H Lenore Cordia, MD   5,000 Units at 09/07/20 0539  . ondansetron (ZOFRAN) tablet 4 mg  4 mg Oral Q6H PRN Lenore Cordia, MD       Or  . ondansetron (ZOFRAN) injection 4 mg  4 mg Intravenous Q6H PRN Lenore Cordia, MD      . senna-docusate (Senokot-S) tablet 1 tablet  1 tablet Oral QHS PRN Zada Finders R, MD      . sevelamer carbonate (RENVELA) tablet 2,400 mg  2,400 mg Oral TID WC Kolluru, Sarath, MD      . sodium chloride flush (NS) 0.9 % injection 3 mL  3 mL Intravenous Q12H Lenore Cordia, MD  3 mL at 09/06/20 0107    Musculoskeletal: Strength & Muscle Tone: decreased Gait & Station: unable to stand Patient leans: N/A            Psychiatric Specialty Exam:  Presentation  General Appearance: No data recorded Eye Contact:No data recorded Speech:No data recorded Speech Volume:No data recorded Handedness:No data recorded  Mood and Affect  Mood:No data recorded Affect:No data recorded  Thought Process  Thought Processes:No data recorded Descriptions of Associations:No data recorded Orientation:No data recorded Thought Content:No data recorded History of Schizophrenia/Schizoaffective disorder:No data recorded Duration of Psychotic Symptoms:No data recorded Hallucinations:No data recorded Ideas of Reference:No data recorded Suicidal Thoughts:No data recorded Homicidal Thoughts:No data recorded  Sensorium  Memory:No data recorded Judgment:No data recorded Insight:No data  recorded  Executive Functions  Concentration:No data recorded Attention Span:No data recorded Recall:No data recorded Fund of Knowledge:No data recorded Language:No data recorded  Psychomotor Activity  Psychomotor Activity:No data recorded  Assets  Assets:No data recorded  Sleep  Sleep:No data recorded  Physical Exam: Physical Exam Constitutional:      Appearance: Normal appearance. He is ill-appearing.  HENT:     Head: Normocephalic and atraumatic.     Mouth/Throat:     Pharynx: Oropharynx is clear.  Eyes:     Pupils: Pupils are equal, round, and reactive to light.  Cardiovascular:     Rate and Rhythm: Normal rate and regular rhythm.  Pulmonary:     Effort: Pulmonary effort is normal.     Breath sounds: Normal breath sounds.  Abdominal:     General: Abdomen is flat.     Palpations: Abdomen is soft.  Musculoskeletal:        General: Normal range of motion.  Skin:    General: Skin is warm and dry.  Neurological:     General: No focal deficit present.     Mental Status: He is alert. Mental status is at baseline.  Psychiatric:        Attention and Perception: He is inattentive.        Mood and Affect: Mood normal. Affect is angry and inappropriate.        Behavior: Behavior is agitated.        Thought Content: Thought content is paranoid. Thought content does not include homicidal or suicidal ideation.        Cognition and Memory: Cognition is impaired.        Judgment: Judgment is inappropriate.    ROS Blood pressure (!) 147/87, pulse 85, temperature 97.9 F (36.6 C), temperature source Oral, resp. rate 18, height '5\' 4"'$  (1.626 m), weight 52.8 kg, SpO2 100 %. Body mass index is 19.98 kg/m.  Treatment Plan Summary: Plan Found patient once again angry belligerent and completely uncooperative.  Looking at the rest of the notes this seems to be pretty consistent.  Given the current presentation the patient appears to be lacking in capacity to make decisions if he is  unable to even engage in the rudiments of a conversation.  As I have always said, this could change at any time or under different circumstances but at least to my examination right now he appears to not be able to exercise any rational judgment.  Disposition: No orders or change in assessment  Alethia Berthold, MD 09/08/2020 4:31 PM

## 2020-09-08 NOTE — TOC Initial Note (Addendum)
Transition of Care [facility]) - Initial/Assessment Note    Patient Details  Name: Larry Morrison MRN: [id number] Date of Birth: 07-19-1970  Transition of [facility]) CM/SW Contact:    Larry Sessions, RN Phone Number: 09/08/2020, 3:12 PM  Clinical Narrative:                 Patient with history of TBI brought to the ED 4/16, EMS was called as family found him altered, when EMS arrived patient was covered in feces urine and maggots  Per psych he should not be considered to have the capacity to make a decisions. VM left for patients sister Larry Morrison with patient's Mother Mother states that patient lives in an apartment with his brother Mother states that patient has 3 daughters. 2 that live in [place], one that is from [place] but she is not sure where she is located now.  She does not have the contact information for any of his children     PCP [facility]  Mother states that patient uses Medicaid transportation to appointments and HD.  She is aware that he is non compliant with HD, but states she is not sure why he doesn't go  Reported that patient has been dismissed from several transportation services in the past due to his behaviors  Per Mother patient has a rollator and cane in the home  PT recommending SNF.  Mother agreeable for me to do bed search, and states she will discuss with her other children tonight.  I informed her that with patient being on HD, Medicaid as payor, and documented hostile behavior that I potentially will not get any bed offers.  She states "I guess ill have to stay with him and care for him then"    APS report made.  DSS representative name was Larry Morrison      Patient Goals and CMS Choice        Expected Discharge Plan and Services     Discharge Planning Services: CM Consult   Living arrangements for the past 2 months: Apartment                                      Prior Living Arrangements/Services Living arrangements for the past 2 months:  Apartment Lives with:: Siblings          Need for Family Participation in Patient Care: Yes (Comment) Care giver support system in place?: Yes (comment) Current home services: DME    Activities of Daily Living Home Assistive Devices/Equipment: Engineer, drilling (specify type),Wheelchair,Other (Comment) (Pt need HH) ADL Screening (condition at time of admission) Patient's cognitive ability adequate to safely complete daily activities?: No Is the patient deaf or have difficulty hearing?: No Does the patient have difficulty seeing, even when wearing glasses/contacts?: Yes Does the patient have difficulty concentrating, remembering, or making decisions?: No Patient able to express need for assistance with ADLs?: Yes Does the patient have difficulty dressing or bathing?: Yes Independently performs ADLs?: Yes (appropriate for developmental age) Does the patient have difficulty walking or climbing stairs?: Yes (uses Frechette and wheelchair) Weakness of Legs: Both Weakness of Arms/Hands: Both  Permission Sought/Granted                  Emotional Assessment           Psych Involvement: Yes (comment)  Admission diagnosis:  Metabolic encephalopathy 99991111 Hyperkalemia [E87.5] Lactic acidosis [E87.2] ESRD needing dialysis (  [place]) [N18.6, Z99.2] Altered mental status, unspecified altered mental status type [R41.82] Patient Active Problem List   Diagnosis Date Noted  . History of traumatic brain injury 09/07/2020  . Cognitive impairment 09/07/2020  . Metabolic encephalopathy 0000000  . ESRD needing dialysis ([place]) 09/04/2020  . Hypoglycemia 09/04/2020  . Uremia 09/04/2020  . COVID-19 virus infection 06/18/2020  . Stroke ([place]) 06/18/2020  . Thrombocytopenia ([place]) 06/18/2020  . Elevated troponin 06/18/2020  . Hyperkalemia 06/13/2020  . Anemia in ESRD (end-stage renal disease) ([place]) 09/17/2019  . Essential hypertension 07/15/2019  . Testicular torsion   . Pyuria 06/10/2016   . Chronic pain following surgery or procedure 12/01/2015  . 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 Tobacco use disorder 12/01/2015  . ESRD (end stage renal disease) ([place]) 04/28/2014  . Obstructed nephrostomy tube ([place]) 10/23/2013  . 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 Neurogenic bladder 02/09/1998  . Urinary calculus 02/09/1998  . 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

## 2020-09-08 NOTE — Progress Notes (Signed)
[facility]  ROUNDING NOTE   Subjective:   Patient seen resting in bed with breakfast at bedside  Patient seen later during dialysis   HEMODIALYSIS FLOWSHEET:  Blood Flow Rate (mL/min): 200 mL/min Arterial Pressure (mmHg): -130 mmHg Venous Pressure (mmHg): 230 mmHg Transmembrane Pressure (mmHg): 60 mmHg Ultrafiltration Rate (mL/min): 330 mL/min Dialysate Flow Rate (mL/min): 500 ml/min Conductivity: Machine : 14 Conductivity: Machine : 14 Dialysis Fluid Bolus: Normal Saline Bolus Amount (mL): 300 mL  No complaints at this time Requesting to be discharged home  Objective:  Vital signs in last 24 hours:  Temp:  [97.5 F (36.4 C)-98 F (36.7 C)] 98 F (36.7 C) (04/20 0735) Pulse Rate:  [90-92] 90 (04/20 0735) Resp:  [16-18] 18 (04/20 0735) BP: (142-150)/(90-92) 150/92 (04/20 0735) SpO2:  [99 %-100 %] 100 % (04/20 0735)  Weight change:  Filed Weights   09/04/20 1945 09/07/20 0425  Weight: 54.4 kg 52.8 kg    Intake/Output: I/O last 3 completed shifts: In: 18 [P.O.:660; Other:100] Out: 300 [Urine:300]   Intake/Output this shift:  No intake/output data recorded.  Physical Exam: General: NAD, lying in bed   Head: Normocephalic, atraumatic. Moist oral mucosal membranes  Eyes: Anicteric  Lungs:  Clear bilaterally, normal breathing effort  Heart: Regular rate and rhythm   Abdomen:  Soft, nontender   Extremities:  no peripheral edema.  Neurologic: Alert and oriented   Skin: No lesions  Access: Right upper AVF     Basic Metabolic Panel: Recent Labs  Lab 09/04/20 1950 09/05/20 0658 09/07/20 0552 09/08/20 0806  NA 145 140 139 140  K 7.2* 5.1 3.9 4.3  CL 107 92* 101 102  CO2 7* '26 24 23  '$ GLUCOSE 38* 474* 80 80  BUN 150* 125* 40* 51*  CREATININE 13.80* 11.77* 5.45* 6.77*  CALCIUM 9.3 8.2* 8.0* 7.9*  PHOS  --  11.3*  --   --     Liver Function Tests: Recent Labs  Lab 09/04/20 1950 09/05/20 0658 09/07/20 0552  AST 38  --  27  ALT 49*   --  36  ALKPHOS 64  --  59  BILITOT 1.8*  --  1.4*  PROT 8.6*  --  6.6  ALBUMIN 3.5 2.9* 2.5*   No results for input(s): LIPASE, AMYLASE in the last 168 hours. No results for input(s): AMMONIA in the last 168 hours.  CBC: Recent Labs  Lab 09/04/20 1950 09/05/20 0500 09/07/20 0552 09/08/20 0806  WBC 10.1 9.7 7.1 5.9  NEUTROABS 9.5*  --   --   --   HGB 12.0* 11.7* 10.5* 11.0*  HCT 39.4 36.0* 33.0* 35.7*  MCV 102.6* 97.6 100.0 100.6*  PLT 168 119* 78* 89*    Cardiac Enzymes: No results for input(s): CKTOTAL, CKMB, CKMBINDEX, TROPONINI in the last 168 hours.  BNP: Invalid input(s): POCBNP  CBG: Recent Labs  Lab 09/04/20 2306 09/05/20 0200 09/07/20 1105 09/08/20 0737 09/08/20 0842  GLUCAP 123* 144* 104* 62* 114*    Microbiology: Results for orders placed or performed during the hospital encounter of 09/04/20  Blood Culture (routine x 2)     Status: None (Preliminary result)   Collection Time: 09/04/20  7:50 PM   Specimen: BLOOD  Result Value Ref Range Status   Specimen Description   Final    BLOOD BLOOD LEFT FOREARM Performed at [facility], [address], [place], [place] [address]    Special Requests   Final    BOTTLES DRAWN AEROBIC AND  ANAEROBIC Blood Culture adequate volume Performed at [facility], [address] [place], [place] [address]    Culture  Setup Time   Final    GRAM POSITIVE RODS AEROBIC BOTTLE ONLY CRITICAL RESULT CALLED TO, READ BACK BY AND VERIFIED WITH: Winfield Rast PATEL AT 2359 09/05/20 MF    Culture   Final    GRAM POSITIVE RODS IDENTIFICATION TO FOLLOW Performed at [facility], [place] [address], [place], [place] [address]    Report Status PENDING  Incomplete  Resp Panel by RT-PCR (Flu A&B, Covid) Nasopharyngeal Swab     Status: None   Collection Time: 09/04/20 10:52 PM   Specimen: Nasopharyngeal Swab; Nasopharyngeal(NP) swabs in vial transport medium  Result Value Ref Range Status   SARS Coronavirus 2 by  RT PCR NEGATIVE NEGATIVE Final    Comment: (NOTE) SARS-CoV-2 target nucleic acids are NOT DETECTED.  The SARS-CoV-2 RNA is generally detectable in upper respiratory specimens during the acute phase of infection. The lowest concentration of SARS-CoV-2 viral copies this assay can detect is 138 copies/mL. A negative result does not preclude SARS-Cov-2 infection and should not be used as the sole basis for treatment or other patient management decisions. A negative result may occur with  improper specimen collection/handling, submission of specimen other than nasopharyngeal swab, presence of viral mutation(s) within the areas targeted by this assay, and inadequate number of viral copies(<138 copies/mL). A negative result must be combined with clinical observations, patient history, and epidemiological information. The expected result is Negative.  Fact Sheet for Patients:  EntrepreneurPulse.com.au  Fact Sheet for Healthcare Providers:  IncredibleEmployment.be  This test is no t yet approved or cleared by the [place] FDA and  has been authorized for detection and/or diagnosis of SARS-CoV-2 by FDA under an Emergency Use Authorization (EUA). This EUA will remain  in effect (meaning this test can be used) for the duration of the COVID-19 declaration under Section 564(b)(1) of the Act, 21 U.S.C.section 360bbb-3(b)(1), unless the authorization is terminated  or revoked sooner.       Influenza A by PCR NEGATIVE NEGATIVE Final   Influenza B by PCR NEGATIVE NEGATIVE Final    Comment: (NOTE) The Xpert Xpress SARS-CoV-2/FLU/RSV plus assay is intended as an aid in the diagnosis of influenza from Nasopharyngeal swab specimens and should not be used as a sole basis for treatment. Nasal washings and aspirates are unacceptable for Xpert Xpress SARS-CoV-2/FLU/RSV testing.  Fact Sheet for Patients: EntrepreneurPulse.com.au  Fact Sheet  for Healthcare Providers: IncredibleEmployment.be  This test is not yet approved or cleared by the [place] FDA and has been authorized for detection and/or diagnosis of SARS-CoV-2 by FDA under an Emergency Use Authorization (EUA). This EUA will remain in effect (meaning this test can be used) for the duration of the COVID-19 declaration under Section 564(b)(1) of the Act, 21 U.S.C. section 360bbb-3(b)(1), unless the authorization is terminated or revoked.  Performed at [facility], [address] [place],  [address]     Coagulation Studies: No results for input(s): LABPROT, INR in the last 72 hours.  Urinalysis: No results for input(s): COLORURINE, LABSPEC, PHURINE, GLUCOSEU, HGBUR, BILIRUBINUR, KETONESUR, PROTEINUR, UROBILINOGEN, NITRITE, LEUKOCYTESUR in the last 72 hours.  Invalid input(s): APPERANCEUR    Imaging: No results found.   Medications:    . albuterol  2.5 mg Nebulization Once  . heparin  5,000 Units Subcutaneous Q8H  . sevelamer carbonate  2,400 mg Oral TID WC  . sodium chloride flush  3 mL Intravenous Q12H   acetaminophen **OR** acetaminophen, ondansetron **OR** ondansetron (ZOFRAN) IV, senna-docusate  Assessment/ Plan:  [name] is a 50 y.o.  male with ESRD on HD, HTN, stroke, right nephrostomy tube  who presents to [facility] on 09/04/20 for altered mental status. Patient was found to be hyperkalemic and hypoglycemic   [facility] Left AVF 49kg   1. ESRD on dialysis with hyperkalemia  - Scheduled to receive dialysis today - UF goal 1L - Next scheduled treatment on Friday - After discussion and patient being agreeable to maintain dialysis schedule yesterday, he shows some hesitation and agitation to have to receive treatment today. Patient states he wants to continue with dialysis.  - Patient request his mother make his decisions if he is unable.   2. Anemia of CKD  - hgb 11.0 - at  goal  3. Secondary hyperparathyroidism of renal origin - phos 11.3  - on renvela as an outpatient  - Renvela with meals    LOS: 4 Larry Morrison 4/20/202210:15 AM

## 2020-09-09 ENCOUNTER — Other Ambulatory Visit: Payer: Medicaid Other

## 2020-09-09 LAB — CULTURE, BLOOD (ROUTINE X 2): Special Requests: ADEQUATE

## 2020-09-09 LAB — GLUCOSE, CAPILLARY
Glucose-Capillary: 139 mg/dL — ABNORMAL HIGH (ref 70–99)
Glucose-Capillary: 169 mg/dL — ABNORMAL HIGH (ref 70–99)

## 2020-09-09 NOTE — Progress Notes (Signed)
PROGRESS NOTE    Larry Morrison  [id number] DOB: 12/21/1970 DOA: 09/04/2020 PCP: [facility]  Brief Narrative:  Chronically ill noncompliant 50 year old African-American male with history of CVA, ESRD on hemodialysis, chronic right nephrostomy drain, nephrolithiasis, neurogenic bladder who was brought to the ED 4/16, EMS was called as family found him altered, when EMS arrived patient was covered in feces urine and maggots. -Very poor, limited historian -In the ED he was noted to be hypertensive, tachypneic, O2 sats dropped to 87%, labs were notable for potassium of 7.2, bicarb of 7, BUN of 150, chest x-ray noted bilateral lower lobe opacities left greater than right with probable small effusions. -Previously admitted to [facility] in March after missing hemodialysis for 3 to 4 weeks -Patient has been agitated and angry, refusing nursing care and cursing at staff including palliative care and psychiatry consult -When palliative care discussed with the patient's mother she requested transfer to [facility] as the physicians know him there however at this point there is no medically justifiable reason for transfer -When I discussed with the patient he says he only wants to go home and has no interest in any rehab or nursing facility  4/21: Patient more calm on my evaluation today.  We had a lengthy conversation about his overall state of health and the very high likelihood of bounce back should we discharge him home.  He is unable to provide a clear response when asking these questions.  Per psychiatry evaluation the patient does not have capacity however he was basically unwilling to speak to a psychiatric consult.  I also discussed the case with nephrology.  Disposition remains a very large challenge.  I also discussed with patient the need for nephrostomy tube exchange which she is in agreement with.  Interventional radiology reconsulted.   Assessment & Plan:   Principal Problem:    History of traumatic brain injury Active Problems:   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[place])   Neurogenic bladder   Hyperkalemia   ESRD needing dialysis ([place])   Hypoglycemia   Uremia   Metabolic encephalopathy   Cognitive impairment  Metabolic encephalopathy Uremia Noncompliance -Initially was encephalopathic which was primarily secondary to uremia, urine also appears abnormal and he has a right nephrostomy drain which likely has not been managed safely -BUN down to 51 from 150 on admission -Awake and alert now, cursing, with some cognitive deficits -PT OT, ideally needs SNF.  SNF not an option at this point, and patient refusing anyways -Called patient's mother and emphasized need for closer supervision,  she knows he needs dialysis to survive and wishes [place] to do everything to keep him alive -Psychiatry consulted for capacity evaluation however patient was extremely agitated and cursing and psychiatry consult -Main issue is noncompliance with dialysis, palliative consulted -Nephrology following for inpatient hemodialysis 4/21: Patient in agreement with nephrostomy tube exchange Plan: Nephrology following for inpatient hemodialysis needs Interventional radiology reconsulted for nephrostomy tube exchange.  Hopefully can happen tomorrow 4/22   Hyperkalemia ESRD on hemodialysis -Estimated dry weight is 50.5 kg per DC summary from [facility] -Missed HD for about 2-3 weeks now, was last hospitalized at [facility] in March, reportedly had missed dialysis for 1 month prior to that -Hyperkalemia improved -Dialyzed 4/18 -Dialyzed 4/20 - Plan: Next scheduled hemodialysis 4/22 Nephrology followup for inpatient HD  Bilateral lung opacities -Suspect this is secondary to fluid overload and less likely pneumonia, also had Covid earlier this year, could have residual findings secondary to that -Gram-positive cocci in 1 out of  2 blood cultures likely contaminant -Discontinued antibiotics  Nephrolithiasis,  neurogenic bladder -Chronic right nephrostomy drain -Last exchanged at [facility] on 02/24/2020 -After discussion with the patient and the nephrology consultant we will pursue nephrostomy tube exchange during this admission.  The patient is in agreement when I stressed the importance of this.  Interventional radiology reconsulted, n.p.o. after midnight  Hyperphosphatemia -Likely noncompliant with phosphate binders, resume PhosLo 3 times daily with meals  Hypoglycemia -Resolved, hemoglobin A1c is 5   DVT prophylaxis: SQ heparin Code Status: FULL Family Communication: Mother Kavari Carthan [phone] on 4/21 Disposition Plan: Status is: Inpatient  Remains inpatient appropriate because:Altered mental status, Unsafe d/c plan and Inpatient level of care appropriate due to severity of illness   Dispo: The patient is from: Home              Anticipated d/c is to: SNF vs home with home health              Patient currently is not medically stable to d/c.   Difficult to place patient Yes  Complex patient, ESRD.  Presentation complicated by very poor self-care at home.  Patient again refusing skilled nursing facility placement, unclear whether this is even an option.  I discussed with the patient as well as his mother over the phone.  Both are in agreement to return to home once hospital course is complete.  Possible discharge within 24 to 48 hours if we are able to accomplish nephrostomy tube exchange and successful hemodialysis.     Level of care: Progressive Cardiac  Consultants:   Nephrology  Psychiatry  Palliative care  Procedures: None Antimicrobials:   None   Subjective: Patient seen and examined.  Flattened affect, verbally does not endorse any complaints.  Objective: Vitals:   09/08/20 1453 09/09/20 0420 09/09/20 0423 09/09/20 0744  BP:  140/88 129/84 127/81  Pulse:  91 90 89  Resp: '18 20 20 18  '$ Temp:  98.2 F (36.8 C) 98 F (36.7 C)   TempSrc:  Oral Oral    SpO2:  98% 94%   Weight:      Height:        Intake/Output Summary (Last 24 hours) at 09/09/2020 1210 Last data filed at 09/09/2020 1031 Gross per 24 hour  Intake 0 ml  Output 1000 ml  Net -1000 ml   Filed Weights   09/04/20 1945 09/07/20 0425  Weight: 54.4 kg 52.8 kg    Examination:  General exam: No acute distress.  Appears frail and chronically ill Respiratory system: Coarse breath sounds bilaterally.  Normal work of breathing.  Room air Cardiovascular system: S1-S2, regular rate and rhythm,+ murmur Gastrointestinal system: Thin, soft, nontender/nondistended, right nephrostomy drain Central nervous system: Alert, oriented times 2, no focal cranial nerve deficits  extremities: Muscle wasting noted bilaterally.  Decreased power diffusely.  Weak pulses bilateral lower extremities Skin: Scattered excoriations, dry scaly skin Psychiatry: Judgment and insight extremely poor.  Mood and affect agitated    Data Reviewed: I have personally reviewed following labs and imaging studies  CBC: Recent Labs  Lab 09/04/20 1950 09/05/20 0500 09/07/20 0552 09/08/20 0806  WBC 10.1 9.7 7.1 5.9  NEUTROABS 9.5*  --   --   --   HGB 12.0* 11.7* 10.5* 11.0*  HCT 39.4 36.0* 33.0* 35.7*  MCV 102.6* 97.6 100.0 100.6*  PLT 168 119* 78* 89*   Basic Metabolic Panel: Recent Labs  Lab 09/04/20 1950 09/05/20 0658 09/07/20 0552 09/08/20 0806  NA 145 140  139 140  K 7.2* 5.1 3.9 4.3  CL 107 92* 101 102  CO2 7* '26 24 23  '$ GLUCOSE 38* 474* 80 80  BUN 150* 125* 40* 51*  CREATININE 13.80* 11.77* 5.45* 6.77*  CALCIUM 9.3 8.2* 8.0* 7.9*  PHOS  --  11.3*  --  6.8*   GFR: Estimated Creatinine Clearance: 9.5 mL/min (A) (by C-G formula based on SCr of 6.77 mg/dL (H)). Liver Function Tests: Recent Labs  Lab 09/04/20 1950 09/05/20 0658 09/07/20 0552  AST 38  --  27  ALT 49*  --  36  ALKPHOS 64  --  59  BILITOT 1.8*  --  1.4*  PROT 8.6*  --  6.6  ALBUMIN 3.5 2.9* 2.5*   No results for  input(s): LIPASE, AMYLASE in the last 168 hours. No results for input(s): AMMONIA in the last 168 hours. Coagulation Profile: Recent Labs  Lab 09/05/20 0500  INR 1.6*   Cardiac Enzymes: No results for input(s): CKTOTAL, CKMB, CKMBINDEX, TROPONINI in the last 168 hours. BNP (last 3 results) No results for input(s): PROBNP in the last 8760 hours. HbA1C: No results for input(s): HGBA1C in the last 72 hours. CBG: Recent Labs  Lab 09/07/20 1105 09/08/20 0737 09/08/20 0842 09/08/20 1655 09/09/20 0753  GLUCAP 104* 62* 114* 83 139*   Lipid Profile: No results for input(s): CHOL, HDL, LDLCALC, TRIG, CHOLHDL, LDLDIRECT in the last 72 hours. Thyroid Function Tests: No results for input(s): TSH, T4TOTAL, FREET4, T3FREE, THYROIDAB in the last 72 hours. Anemia Panel: No results for input(s): VITAMINB12, FOLATE, FERRITIN, TIBC, IRON, RETICCTPCT in the last 72 hours. Sepsis Labs: Recent Labs  Lab 09/04/20 1950 09/04/20 2235 09/05/20 0500  PROCALCITON  --   --  3.63  LATICACIDVEN 10.1* 3.1* 5.1*    Recent Results (from the past 240 hour(s))  Blood Culture (routine x 2)     Status: Abnormal   Collection Time: 09/04/20  7:50 PM   Specimen: BLOOD  Result Value Ref Range Status   Specimen Description   Final    BLOOD BLOOD LEFT FOREARM Performed at [facility], [address], [place], [place] [address]    Special Requests   Final    BOTTLES DRAWN AEROBIC AND ANAEROBIC Blood Culture adequate volume Performed at [facility], [address] [place], [place] [address]    Culture  Setup Time   Final    GRAM POSITIVE RODS AEROBIC BOTTLE ONLY CRITICAL RESULT CALLED TO, READ BACK BY AND VERIFIED WITH: Winfield Rast PATEL AT 2359 09/05/20 MF    Culture (A)  Final    CORYNEBACTERIUM SPECIES Standardized susceptibility testing for this organism is not available. Performed at [facility], [place] [address] [place], [place] [address]    Report Status 09/09/2020 FINAL   Final  Resp Panel by RT-PCR (Flu A&B, Covid) Nasopharyngeal Swab     Status: None   Collection Time: 09/04/20 10:52 PM   Specimen: Nasopharyngeal Swab; Nasopharyngeal(NP) swabs in vial transport medium  Result Value Ref Range Status   SARS Coronavirus 2 by RT PCR NEGATIVE NEGATIVE Final    Comment: (NOTE) SARS-CoV-2 target nucleic acids are NOT DETECTED.  The SARS-CoV-2 RNA is generally detectable in upper respiratory specimens during the acute phase of infection. The lowest concentration of SARS-CoV-2 viral copies this assay can detect is 138 copies/mL. A negative result does not preclude SARS-Cov-2 infection and should not be used as the sole basis for treatment or other patient management decisions. A  negative result may occur with  improper specimen collection/handling, submission of specimen other than nasopharyngeal swab, presence of viral mutation(s) within the areas targeted by this assay, and inadequate number of viral copies(<138 copies/mL). A negative result must be combined with clinical observations, patient history, and epidemiological information. The expected result is Negative.  Fact Sheet for Patients:  EntrepreneurPulse.com.au  Fact Sheet for Healthcare Providers:  IncredibleEmployment.be  This test is no t yet approved or cleared by the [place] FDA and  has been authorized for detection and/or diagnosis of SARS-CoV-2 by FDA under an Emergency Use Authorization (EUA). This EUA will remain  in effect (meaning this test can be used) for the duration of the COVID-19 declaration under Section 564(b)(1) of the Act, 21 U.S.C.section 360bbb-3(b)(1), unless the authorization is terminated  or revoked sooner.       Influenza A by PCR NEGATIVE NEGATIVE Final   Influenza B by PCR NEGATIVE NEGATIVE Final    Comment: (NOTE) The Xpert Xpress SARS-CoV-2/FLU/RSV plus assay is intended as an aid in the diagnosis of influenza from  Nasopharyngeal swab specimens and should not be used as a sole basis for treatment. Nasal washings and aspirates are unacceptable for Xpert Xpress SARS-CoV-2/FLU/RSV testing.  Fact Sheet for Patients: EntrepreneurPulse.com.au  Fact Sheet for Healthcare Providers: IncredibleEmployment.be  This test is not yet approved or cleared by the [place] FDA and has been authorized for detection and/or diagnosis of SARS-CoV-2 by FDA under an Emergency Use Authorization (EUA). This EUA will remain in effect (meaning this test can be used) for the duration of the COVID-19 declaration under Section 564(b)(1) of the Act, 21 U.S.C. section 360bbb-3(b)(1), unless the authorization is terminated or revoked.  Performed at [facility], [address], [place], [place] [address]          Radiology Studies: No results found.      Scheduled Meds: . albuterol  2.5 mg Nebulization Once  . heparin  5,000 Units Subcutaneous Q8H  . sevelamer carbonate  2,400 mg Oral TID WC  . sodium chloride flush  3 mL Intravenous Q12H   Continuous Infusions:   LOS: 5 days    Time spent: 25 minutes    Sidney Ace, MD [facility] Pager [phone]  If 7PM-7AM, please contact night-coverage 09/09/2020, 12:10 PM

## 2020-09-09 NOTE — Progress Notes (Signed)
Pt refusing BG check, VS check, bedtime medications, and head-to-toe assessment. Attempted to educate pt on the need for the above procedures. Education attempt refused. NP notified.

## 2020-09-09 NOTE — Progress Notes (Signed)
Pt allowed NT to take AM VS with persuasion; however, still refusing blood glucose check and assessment.

## 2020-09-09 NOTE — Consult Note (Signed)
Chief Complaint: Patient was seen in consultation today for nephrostomy tube exchange  Referring Physician(s): Ralene Muskrat, MD  Patient Status: [place] - In-pt  History of Present Illness: Larry Morrison is a 50 y.o. male with history of cerebral aneurysm, traumatic brain injury, right nephrolithiasis with chronic right nephrostomy tube.  His nephrostomy was placed and is routinely managed at [facility], last exchange in October 2021.  Currently draining into bag with clear urine.  No evidence of infection.    Today the patient expresses understanding of necessity of nephrostomy tube exchange and motivation to be discharged home.  He states that he received sedation for prior exchanges and wishes for the same for this exchange.  Past Medical History:  Diagnosis Date  . Anemia   . Aneurysm ([place])    brain at age 89  . Dialysis patient ([place])   . Dyspnea   . Foot drop   . History of kidney stones   . History of nephrostomy   . Hypertension   . Renal disorder   . Stroke [facility])     Past Surgical History:  Procedure Laterality Date  . AV FISTULA PLACEMENT Right 08/07/2019   Procedure: ARTERIOVENOUS (AV) FISTULA CREATION;  Surgeon: Algernon Huxley, MD;  Location: [facility] ORS;  Service: Vascular;  Laterality: Right;  . AV FISTULA PLACEMENT Right 11/20/2019   Procedure: INSERTION OF ARTERIOVENOUS (AV) GORE-TEX GRAFT ARM;  Surgeon: Algernon Huxley, MD;  Location: [facility] ORS;  Service: Vascular;  Laterality: Right;  . NEPHRECTOMY Left   . NEPHRECTOMY    . ORCHIECTOMY Right 10/27/2016   Procedure: PSB ORCHIECTOMY;  Surgeon: Hollice Espy, MD;  Location: [facility] ORS;  Service: Urology;  Laterality: Right;  . ORCHIOPEXY Bilateral 10/27/2016   Procedure: ORCHIOPEXY ADULT;  Surgeon: Hollice Espy, MD;  Location: [facility] ORS;  Service: Urology;  Laterality: Bilateral;  . SCROTAL EXPLORATION Bilateral 10/27/2016   Procedure: SCROTUM EXPLORATION;  Surgeon: Hollice Espy, MD;  Location: [facility] ORS;  Service: Urology;   Laterality: Bilateral;    Allergies: Vancomycin  Medications: Prior to Admission medications   Not on File     Family History  Family history unknown: Yes    Social History   Socioeconomic History  . Marital status: Single    Spouse name: Not on file  . Number of children: Not on file  . Years of education: Not on file  . Highest education level: Not on file  Occupational History  . Not on file  Tobacco Use  . Smoking status: Former Smoker    Packs/day: 0.50    Types: Cigarettes  . Smokeless tobacco: Never Used  Vaping Use  . Vaping Use: Never used  Substance and Sexual Activity  . Alcohol use: No  . Drug use: Yes    Types: Marijuana    Comment: today  . Sexual activity: Not Currently  Other Topics Concern  . Not on file  Social History Narrative  . Not on file   Social Determinants of Health   Financial Resource Strain: Not on file  Food Insecurity: Not on file  Transportation Needs: Not on file  Physical Activity: Not on file  Stress: Not on file  Social Connections: Not on file    Review of Systems: A 12 point ROS discussed and pertinent positives are indicated in the HPI above.  All other systems are negative.  Vital Signs: BP 127/81 (BP Location: Left Arm)   Pulse 89   Temp 98 F (36.7 C) (Oral)   Resp  18   Ht '5\' 4"'$  (1.626 m)   Wt 52.8 kg   SpO2 94%   BMI 19.98 kg/m   Physical Exam Constitutional:      Appearance: Normal appearance.  HENT:     Head: Normocephalic.     Mouth/Throat:     Mouth: Mucous membranes are moist.  Cardiovascular:     Rate and Rhythm: Normal rate and regular rhythm.  Pulmonary:     Breath sounds: Normal breath sounds.  Abdominal:     General: There is no distension.  Genitourinary:    Comments: Right nephrostomy with clear urine in bag.  Skin:    General: Skin is warm.  Neurological:     Mental Status: He is alert. Mental status is at baseline.     Imaging: DG Chest Port 1 View  Result Date:  09/04/2020 CLINICAL DATA:  Questionable sepsis EXAM: PORTABLE CHEST 1 VIEW COMPARISON:  06/18/2020 FINDINGS: Cardiomegaly. Bilateral lower lobe airspace opacities, left greater than right. Suspect layering effusions. Findings concerning for pneumonia. No acute bony abnormality. IMPRESSION: Bilateral lower lobe opacities, left greater than right with probable small effusions. Findings concerning for pneumonia. Electronically Signed   By: Rolm Baptise M.D.   On: 09/04/2020 20:38    Labs:  CBC: Recent Labs    09/04/20 1950 09/05/20 0500 09/07/20 0552 09/08/20 0806  WBC 10.1 9.7 7.1 5.9  HGB 12.0* 11.7* 10.5* 11.0*  HCT 39.4 36.0* 33.0* 35.7*  PLT 168 119* 78* 89*    COAGS: Recent Labs    11/20/19 1407 09/05/20 0500  INR 1.1 1.6*  APTT 37*  --     BMP: Recent Labs    11/20/19 1407 03/28/20 1523 09/04/20 1950 09/05/20 0658 09/07/20 0552 09/08/20 0806  NA 139   < > 145 140 139 140  K 3.1*   < > 7.2* 5.1 3.9 4.3  CL 99   < > 107 92* 101 102  CO2 27   < > 7* '26 24 23  '$ GLUCOSE 73   < > 38* 474* 80 80  BUN 27*   < > 150* 125* 40* 51*  CALCIUM 8.4*   < > 9.3 8.2* 8.0* 7.9*  CREATININE 7.37*   < > 13.80* 11.77* 5.45* 6.77*  GFRNONAA 8*   < > 4* 5* 12* 9*  GFRAA 9*  --   --   --   --   --    < > = values in this interval not displayed.    LIVER FUNCTION TESTS: Recent Labs    06/13/20 1146 06/15/20 0413 09/04/20 1950 09/05/20 0658 09/07/20 0552  BILITOT 1.6*  --  1.8*  --  1.4*  AST 30  --  38  --  27  ALT 45*  --  49*  --  36  ALKPHOS 59  --  64  --  59  PROT 8.3*  --  8.6*  --  6.6  ALBUMIN 3.3* 2.6* 3.5 2.9* 2.5*    TUMOR MARKERS: No results for input(s): AFPTM, CEA, CA199, CHROMGRNA in the last 8760 hours.  Assessment and Plan:  50 year old male with history of right nephrolithiasis and chronic indwelling right nephrostomy tube.  Last exchange at [facility] in October 2021.  Currently admitted after being found unresponsive with multiple comorbidities.    Some  concern about the patient's capacity, however upon my discussion with the patient today he is alert, oriented, and expressed understanding of his current situation and care of his nephrostomy.  He wishes to sign his consent form tomorrow in IR, not today.  Please make NPO after midnight, as the patient requests moderate sedation for the exchange.    IR will attempt to fit into schedule tomorrow, but may have to be delayed to next week.  If needed the patient can return as an outpatient - no need to keep patient hospitalized just for exchange of well-functioning chronic nephrostomy tube, though we recognize the extreme social circumstances and limitations to care for this patient.   Electronically Signed: Suzette Battiest, MD 09/09/2020, 1:35 PM   I spent a total of 20 Minutes  in face to face in clinical consultation, greater than 50% of which was counseling/coordinating care for nephrostomy tube exchange.

## 2020-09-09 NOTE — Progress Notes (Signed)
Per night nurse Loma Sousa RN, pt refused morning heparin

## 2020-09-09 NOTE — Progress Notes (Signed)
PT Cancellation Note  Patient Details Name: Graycen Hadorn MRN: [id number] DOB: [birth year]   Cancelled Treatment:    Reason Eval/Treat Not Completed: Patient declined, no reason specified. Patient states he does not want therapy. Does not want PT to return. Order completed at this time per patient wishes.      Hailly Fess 09/09/2020, 9:17 AM

## 2020-09-09 NOTE — Progress Notes (Signed)
OT Cancellation Note  Patient Details Name: Larry Morrison MRN: [id number] DOB: 01/10/1971   Cancelled Treatment:    Reason Eval/Treat Not Completed: Patient declined, no reason specified. OT attempted evaluations on 4/18 and 4/19 with OT signing OFF on 4/19 with pt refusal to participate. OT re consulted 4/20 for OT evaluation. Pt declines OT intervention and requests therapist not return. Pt does not have an interest in therapeutic intervention at this time. OT to SIGN OFF and complete order per pt request.   Darleen Crocker, MS, OTR/L , CBIS ascom [phone]  09/09/20, 9:41 AM   09/09/2020, 9:41 AM

## 2020-09-09 NOTE — Progress Notes (Signed)
[facility]  ROUNDING NOTE   Subjective:   Patient seen resting in bed with breakfast at bedside Agitated at beginning of visit Appetite gradually improving Denies shortness of breath States he is ready to go home  Objective:  Vital signs in last 24 hours:  Temp:  [98 F (36.7 C)-98.2 F (36.8 C)] 98 F (36.7 C) (04/21 0423) Pulse Rate:  [84-93] 89 (04/21 0744) Resp:  [18-28] 18 (04/21 0744) BP: (127-152)/(79-88) 127/81 (04/21 0744) SpO2:  [94 %-98 %] 94 % (04/21 0423)  Weight change:  Filed Weights   09/04/20 1945 09/07/20 0425  Weight: 54.4 kg 52.8 kg    Intake/Output: I/O last 3 completed shifts: In: -  Out: 1000 [Other:1000]   Intake/Output this shift:  No intake/output data recorded.  Physical Exam: General: NAD, lying in bed   Head: Normocephalic, atraumatic. Moist oral mucosal membranes  Eyes: Anicteric  Lungs:  Clear bilaterally, normal breathing effort  Heart: Regular rate and rhythm   Abdomen:  Soft, nontender   Extremities:  no peripheral edema.  Neurologic: Alert and oriented   Skin: No lesions  Access: Right upper AVF     Basic Metabolic Panel: Recent Labs  Lab 09/04/20 1950 09/05/20 0658 09/07/20 0552 09/08/20 0806  NA 145 140 139 140  K 7.2* 5.1 3.9 4.3  CL 107 92* 101 102  CO2 7* '26 24 23  '$ GLUCOSE 38* 474* 80 80  BUN 150* 125* 40* 51*  CREATININE 13.80* 11.77* 5.45* 6.77*  CALCIUM 9.3 8.2* 8.0* 7.9*  PHOS  --  11.3*  --  6.8*    Liver Function Tests: Recent Labs  Lab 09/04/20 1950 09/05/20 0658 09/07/20 0552  AST 38  --  27  ALT 49*  --  36  ALKPHOS 64  --  59  BILITOT 1.8*  --  1.4*  PROT 8.6*  --  6.6  ALBUMIN 3.5 2.9* 2.5*   No results for input(s): LIPASE, AMYLASE in the last 168 hours. No results for input(s): AMMONIA in the last 168 hours.  CBC: Recent Labs  Lab 09/04/20 1950 09/05/20 0500 09/07/20 0552 09/08/20 0806  WBC 10.1 9.7 7.1 5.9  NEUTROABS 9.5*  --   --   --   HGB 12.0* 11.7*  10.5* 11.0*  HCT 39.4 36.0* 33.0* 35.7*  MCV 102.6* 97.6 100.0 100.6*  PLT 168 119* 78* 89*    Cardiac Enzymes: No results for input(s): CKTOTAL, CKMB, CKMBINDEX, TROPONINI in the last 168 hours.  BNP: Invalid input(s): POCBNP  CBG: Recent Labs  Lab 09/07/20 1105 09/08/20 0737 09/08/20 0842 09/08/20 1655 09/09/20 0753  GLUCAP 104* 62* 114* 83 139*    Microbiology: Results for orders placed or performed during the hospital encounter of 09/04/20  Blood Culture (routine x 2)     Status: None (Preliminary result)   Collection Time: 09/04/20  7:50 PM   Specimen: BLOOD  Result Value Ref Range Status   Specimen Description   Final    BLOOD BLOOD LEFT FOREARM Performed at [facility], [address] [place], [place] [address]    Special Requests   Final    BOTTLES DRAWN AEROBIC AND ANAEROBIC Blood Culture adequate volume Performed at [facility], [address] [place], [place] [address]    Culture  Setup Time   Final    GRAM POSITIVE RODS AEROBIC BOTTLE ONLY CRITICAL RESULT CALLED TO, READ BACK BY AND VERIFIED WITH: Winfield Rast PATEL AT 2359 09/05/20 MF    Culture  Final    GRAM POSITIVE RODS IDENTIFICATION TO FOLLOW Performed at [facility], [place] [address], [place], [place] [address]    Report Status PENDING  Incomplete  Resp Panel by RT-PCR (Flu A&B, Covid) Nasopharyngeal Swab     Status: None   Collection Time: 09/04/20 10:52 PM   Specimen: Nasopharyngeal Swab; Nasopharyngeal(NP) swabs in vial transport medium  Result Value Ref Range Status   SARS Coronavirus 2 by RT PCR NEGATIVE NEGATIVE Final    Comment: (NOTE) SARS-CoV-2 target nucleic acids are NOT DETECTED.  The SARS-CoV-2 RNA is generally detectable in upper respiratory specimens during the acute phase of infection. The lowest concentration of SARS-CoV-2 viral copies this assay can detect is 138 copies/mL. A negative result does not preclude SARS-Cov-2 infection and should not be  used as the sole basis for treatment or other patient management decisions. A negative result may occur with  improper specimen collection/handling, submission of specimen other than nasopharyngeal swab, presence of viral mutation(s) within the areas targeted by this assay, and inadequate number of viral copies(<138 copies/mL). A negative result must be combined with clinical observations, patient history, and epidemiological information. The expected result is Negative.  Fact Sheet for Patients:  EntrepreneurPulse.com.au  Fact Sheet for Healthcare Providers:  IncredibleEmployment.be  This test is no t yet approved or cleared by the [place] FDA and  has been authorized for detection and/or diagnosis of SARS-CoV-2 by FDA under an Emergency Use Authorization (EUA). This EUA will remain  in effect (meaning this test can be used) for the duration of the COVID-19 declaration under Section 564(b)(1) of the Act, 21 U.S.C.section 360bbb-3(b)(1), unless the authorization is terminated  or revoked sooner.       Influenza A by PCR NEGATIVE NEGATIVE Final   Influenza B by PCR NEGATIVE NEGATIVE Final    Comment: (NOTE) The Xpert Xpress SARS-CoV-2/FLU/RSV plus assay is intended as an aid in the diagnosis of influenza from Nasopharyngeal swab specimens and should not be used as a sole basis for treatment. Nasal washings and aspirates are unacceptable for Xpert Xpress SARS-CoV-2/FLU/RSV testing.  Fact Sheet for Patients: EntrepreneurPulse.com.au  Fact Sheet for Healthcare Providers: IncredibleEmployment.be  This test is not yet approved or cleared by the [place] FDA and has been authorized for detection and/or diagnosis of SARS-CoV-2 by FDA under an Emergency Use Authorization (EUA). This EUA will remain in effect (meaning this test can be used) for the duration of the COVID-19 declaration under Section  564(b)(1) of the Act, 21 U.S.C. section 360bbb-3(b)(1), unless the authorization is terminated or revoked.  Performed at [facility], [address] [place], [place] [address]     Coagulation Studies: No results for input(s): LABPROT, INR in the last 72 hours.  Urinalysis: No results for input(s): COLORURINE, LABSPEC, PHURINE, GLUCOSEU, HGBUR, BILIRUBINUR, KETONESUR, PROTEINUR, UROBILINOGEN, NITRITE, LEUKOCYTESUR in the last 72 hours.  Invalid input(s): APPERANCEUR    Imaging: No results found.   Medications:    . albuterol  2.5 mg Nebulization Once  . heparin  5,000 Units Subcutaneous Q8H  . sevelamer carbonate  2,400 mg Oral TID WC  . sodium chloride flush  3 mL Intravenous Q12H   acetaminophen **OR** acetaminophen, ondansetron **OR** ondansetron (ZOFRAN) IV, senna-docusate  Assessment/ Plan:  Larry Morrison is a 50 y.o.  male with ESRD on HD, HTN, stroke, right nephrostomy tube  who presents to [facility] on 09/04/20 for altered mental status. Patient was found to be hyperkalemic and hypoglycemic  [facility] M[facility] Left AVF 49kg   1. ESRD on dialysis with hyperkalemia  - Received dialysis yesterday - UF goal 1L acheived - Next scheduled treatment on Friday - Patient request his mother make his decisions if he is unable.   2. Anemia of CKD  - hgb 11.0 - at goal  3. Secondary hyperparathyroidism of renal origin - improved phos 6.8 - on renvela as an outpatient  - Renvela with meals    LOS: 5 Shawntay Prest 4/21/202210:08 AM

## 2020-09-10 ENCOUNTER — Inpatient Hospital Stay: Payer: Medicaid Other

## 2020-09-10 ENCOUNTER — Encounter: Payer: Self-pay | Admitting: Internal Medicine

## 2020-09-10 HISTORY — PX: IR NEPHROSTOMY EXCHANGE RIGHT: IMG6070

## 2020-09-10 LAB — BASIC METABOLIC PANEL
Anion gap: 10 (ref 5–15)
BUN: 23 mg/dL — ABNORMAL HIGH (ref 6–20)
CO2: 27 mmol/L (ref 22–32)
Calcium: 7.6 mg/dL — ABNORMAL LOW (ref 8.9–10.3)
Chloride: 101 mmol/L (ref 98–111)
Creatinine, Ser: 3.06 mg/dL — ABNORMAL HIGH (ref 0.61–1.24)
GFR, Estimated: 24 mL/min — ABNORMAL LOW (ref >60)
Glucose, Bld: 79 mg/dL (ref 70–99)
Potassium: 3.1 mmol/L — ABNORMAL LOW (ref 3.5–5.1)
Sodium: 138 mmol/L (ref 135–145)

## 2020-09-10 LAB — CBC
HCT: 32 % — ABNORMAL LOW (ref 39.0–52.0)
Hemoglobin: 10.1 g/dL — ABNORMAL LOW (ref 13.0–17.0)
MCH: 31.5 pg (ref 26.0–34.0)
MCHC: 31.6 g/dL (ref 30.0–36.0)
MCV: 99.7 fL (ref 80.0–100.0)
Platelets: 108 10*3/uL — ABNORMAL LOW (ref 150–400)
RBC: 3.21 MIL/uL — ABNORMAL LOW (ref 4.22–5.81)
RDW: 17.1 % — ABNORMAL HIGH (ref 11.5–15.5)
WBC: 5.6 10*3/uL (ref 4.0–10.5)
nRBC: 0 % (ref 0.0–0.2)

## 2020-09-10 LAB — MRSA PCR SCREENING: MRSA by PCR: POSITIVE — AB

## 2020-09-10 LAB — GLUCOSE, CAPILLARY
Glucose-Capillary: 74 mg/dL (ref 70–99)
Glucose-Capillary: 66 mg/dL — ABNORMAL LOW (ref 70–99)
Glucose-Capillary: 73 mg/dL (ref 70–99)
Glucose-Capillary: 135 mg/dL — ABNORMAL HIGH (ref 70–99)

## 2020-09-10 MED ORDER — CHLORHEXIDINE GLUCONATE CLOTH 2 % EX PADS: 6.0000 | MEDICATED_PAD | Freq: Every day | CUTANEOUS | Status: DC

## 2020-09-10 MED ORDER — FENTANYL CITRATE (PF) 100 MCG/2ML IJ SOLN
INTRAMUSCULAR | Status: AC
  Filled 2020-09-10: qty 2

## 2020-09-10 MED ORDER — MIDAZOLAM HCL 2 MG/2ML IJ SOLN
INTRAMUSCULAR | Status: AC | PRN
  Administered 2020-09-10: 1 mg via INTRAVENOUS

## 2020-09-10 MED ORDER — MUPIROCIN 2 % EX OINT
1.0000 "application " | TOPICAL_OINTMENT | Freq: Two times a day (BID) | CUTANEOUS | Status: DC
  Filled 2020-09-10: qty 22

## 2020-09-10 MED ORDER — MIDAZOLAM HCL 2 MG/2ML IJ SOLN
INTRAMUSCULAR | Status: AC
  Filled 2020-09-10: qty 2

## 2020-09-10 MED ORDER — IODIXANOL 320 MG/ML IV SOLN
50.0000 mL | Freq: Once | INTRAVENOUS | Status: AC
  Administered 2020-09-10: 10 mL

## 2020-09-10 MED ORDER — FENTANYL CITRATE (PF) 100 MCG/2ML IJ SOLN
INTRAMUSCULAR | Status: AC | PRN
  Administered 2020-09-10: 50 ug via INTRAVENOUS

## 2020-09-10 NOTE — Progress Notes (Signed)
Patient refuses PM medications and states "I am fine, I don't need nothin." Admin charted as such.  -nothing follows

## 2020-09-10 NOTE — TOC Progression Note (Signed)
Transition of Care [facility]) - Progression Note    Patient Details  Name: Kordae Bensman MRN: [id number] Date of Birth: Dec 07, 1970  Transition of Care [facility]) CM/SW Contact  Beverly Sessions, RN Phone Number: 09/10/2020, 9:47 AM  Clinical Narrative:      Patient with bed offer from [facility] in [place] to confirm with Ebony Hail from [place] [facility] to confirm.  She states due the patient only having Medicaid he would have to have a financial screen before the could actually offer.  She is to follow up with me      Expected Discharge Plan and Services     Discharge Planning Services: CM Consult   Living arrangements for the past 2 months: Apartment                                       Social Determinants of Health (SDOH) Interventions    Readmission Risk Interventions Readmission Risk Prevention Plan 09/08/2020  Transportation Screening Complete  Social Work Consult for [place] Planning/Counseling Complete  Palliative Care Screening Complete  Medication Review Press photographer) Complete  Some recent data might be hidden

## 2020-09-10 NOTE — TOC Progression Note (Signed)
Transition of Care [facility]) - Progression Note    Patient Details  Name: Larry Morrison MRN: [id number] Date of Birth: January 04, 1971  Transition of Care [facility]) CM/SW Contact  Beverly Sessions, RN Phone Number: 09/10/2020, 3:27 PM  Clinical Narrative:      Jeralene Huff to Ebony Hail with [facility] to follow up and see if they can still offer a bed       Expected Discharge Plan and Services     Discharge Planning Services: CM Consult   Living arrangements for the past 2 months: Apartment                                       Social Determinants of Health (SDOH) Interventions    Readmission Risk Interventions Readmission Risk Prevention Plan 09/08/2020  Transportation Screening Complete  Social Work Consult for [place] Planning/Counseling Complete  Palliative Care Screening Complete  Medication Review Press photographer) Complete  Some recent data might be hidden

## 2020-09-10 NOTE — Progress Notes (Signed)
Patient refuses to allow NT to take VS. We will attempt again later in shift.

## 2020-09-10 NOTE — Progress Notes (Signed)
Referring Physician(s): Sreenath,Sudheer B  Supervising Physician: Aletta Edouard  Patient Status:  [facility] - In-pt  Chief Complaint: Right nephrolithiasis   Subjective: Patient with history of right nephrolithiasis and chronic indwelling right nephrostomy tube last exchanged in Oct 2021.  He is in need of routine exchange today.  Consult completed by Dr. Serafina Royals 09/09/20.   Patient assessed in vascular lab today.  He is again found to be alert, oriented, and cooperative.  No changes to his medical status overnight.  Labs and medications reviewed.   Allergies: Vancomycin  Medications: Prior to Admission medications   Not on File     Vital Signs: BP (!) 163/88   Pulse 79   Temp 98 F (36.7 C) (Oral)   Resp (!) 23   Ht '5\' 4"'$  (1.626 m)   Wt 116 lb 6.4 oz (52.8 kg)   SpO2 96%   BMI 19.98 kg/m   Physical Exam Vitals and nursing note reviewed.  Constitutional:      Appearance: Normal appearance.  Cardiovascular:     Rate and Rhythm: Normal rate and regular rhythm.  Pulmonary:     Effort: Pulmonary effort is normal.     Breath sounds: Normal breath sounds.  Abdominal:     General: Abdomen is flat.     Palpations: Abdomen is soft.  Musculoskeletal:     Comments: R nephrostomy tube in place  Skin:    General: Skin is warm and dry.  Neurological:     General: No focal deficit present.     Mental Status: He is alert and oriented to person, place, and time. Mental status is at baseline.     Imaging: No results found.  Labs:  CBC: Recent Labs    09/05/20 0500 09/07/20 0552 09/08/20 0806 09/10/20 1030  WBC 9.7 7.1 5.9 5.6  HGB 11.7* 10.5* 11.0* 10.1*  HCT 36.0* 33.0* 35.7* 32.0*  PLT 119* 78* 89* 108*    COAGS: Recent Labs    11/20/19 1407 09/05/20 0500  INR 1.1 1.6*  APTT 37*  --     BMP: Recent Labs    11/20/19 1407 03/28/20 1523 09/05/20 0658 09/07/20 0552 09/08/20 0806 09/10/20 1030  NA 139   < > 140 139 140 138  K 3.1*   < > 5.1  3.9 4.3 3.1*  CL 99   < > 92* 101 102 101  CO2 27   < > '26 24 23 27  '$ GLUCOSE 73   < > 474* 80 80 79  BUN 27*   < > 125* 40* 51* 23*  CALCIUM 8.4*   < > 8.2* 8.0* 7.9* 7.6*  CREATININE 7.37*   < > 11.77* 5.45* 6.77* 3.06*  GFRNONAA 8*   < > 5* 12* 9* 24*  GFRAA 9*  --   --   --   --   --    < > = values in this interval not displayed.    LIVER FUNCTION TESTS: Recent Labs    06/13/20 1146 06/15/20 0413 09/04/20 1950 09/05/20 0658 09/07/20 0552  BILITOT 1.6*  --  1.8*  --  1.4*  AST 30  --  38  --  27  ALT 45*  --  49*  --  36  ALKPHOS 59  --  64  --  59  PROT 8.3*  --  8.6*  --  6.6  ALBUMIN 3.3* 2.6* 3.5 2.9* 2.5*    Assessment and Plan: Right nephrolithiasis with chronic right nephrostomy  tube Proceed with right nephrostomy tube exchange today.  Patient agreeable and signs consent.  I have also called his mother, Joycelyn Schmid, and discussed the plan for exchange today.  She is also in agreement for nephrostomy tube exchange.   Procedure planned with sedation.  He has been NPO.  Risks and benefits of  PCN exchange was discussed with the patient including, but not limited to, infection, bleeding, significant bleeding causing loss or decrease in renal function or damage to adjacent structures.   All of the patient's questions were answered, patient is agreeable to proceed.  Consent signed and in chart.  Electronically Signed: Docia Barrier, PA 09/10/2020, 12:12 PM   I spent a total of 15 Minutes at the the patient's bedside AND on the patient's hospital floor or unit, greater than 50% of which was counseling/coordinating care for right nephrolithiasis.

## 2020-09-10 NOTE — Progress Notes (Signed)
PROGRESS NOTE    Larry Morrison  [id number] DOB: 02-14-1971 DOA: 09/04/2020 PCP: [facility]  Brief Narrative:  Chronically ill noncompliant 50 year old African-American male with history of CVA, ESRD on hemodialysis, chronic right nephrostomy drain, nephrolithiasis, neurogenic bladder who was brought to the ED 4/16, EMS was called as family found him altered, when EMS arrived patient was covered in feces urine and maggots. -Very poor, limited historian -In the ED he was noted to be hypertensive, tachypneic, O2 sats dropped to 87%, labs were notable for potassium of 7.2, bicarb of 7, BUN of 150, chest x-ray noted bilateral lower lobe opacities left greater than right with probable small effusions. -Previously admitted to [facility] in March after missing hemodialysis for 3 to 4 weeks -Patient has been agitated and angry, refusing nursing care and cursing at staff including palliative care and psychiatry consult -When palliative care discussed with the patient's mother she requested transfer to [facility] as the physicians know him there however at this point there is no medically justifiable reason for transfer -When I discussed with the patient he says he only wants to go home and has no interest in any rehab or nursing facility  4/21: Patient more calm on my evaluation today.  We had a lengthy conversation about his overall state of health and the very high likelihood of bounce back should we discharge him home.  He is unable to provide a clear response when asking these questions.  Per psychiatry evaluation the patient does not have capacity however he was basically unwilling to speak to a psychiatric consult.  I also discussed the case with nephrology.  Disposition remains a very large challenge.  I also discussed with patient the need for nephrostomy tube exchange which he is in agreement with.  Interventional radiology reconsulted.  4/22: No status changes.  Patient n.p.o. for  nephrostomy tube exchange today hopefully.  Discussed with interventional radiology.  They will try to get today.  If unable then will likely monitor patient over weekend and have tube placed on Monday   Assessment & Plan:   Principal Problem:   History of traumatic brain injury Active Problems:   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[place])   Neurogenic bladder   Hyperkalemia   ESRD needing dialysis ([place])   Hypoglycemia   Uremia   Metabolic encephalopathy   Cognitive impairment  Metabolic encephalopathy Uremia Noncompliance -Initially was encephalopathic which was primarily secondary to uremia, urine also appears abnormal and he has a right nephrostomy drain which likely has not been managed safely -BUN down from 150 on admission -Awake and alert now, cursing, with some cognitive deficits -PT OT, ideally needs SNF.  SNF not an option at this point, and patient refusing anyways -Called patient's mother and emphasized need for closer supervision,  she knows he needs dialysis to survive and wishes [place] to do everything to keep him alive -Psychiatry consulted for capacity evaluation however patient was extremely agitated and cursing and psychiatry consult -Main issue is noncompliance with dialysis, palliative consulted -Nephrology following for inpatient hemodialysis 4/21: Patient in agreement with nephrostomy tube exchange Plan: Nephrology following for inpatient HD needs Interventional radiology consulted for nephrostomy tube exchange.  Per IR attending will prioritize hemodialysis today and they will attempt to perform his tube exchange later today.   Hyperkalemia ESRD on hemodialysis -Estimated dry weight is 50.5 kg per DC summary from [facility] -Missed HD for about 2-3 weeks now, was last hospitalized at [facility] in March, reportedly had missed dialysis for 1 month prior  to that -Hyperkalemia improved -Dialyzed 4/18 -Dialyzed 4/20 - Plan: Dialysis today  Bilateral lung opacities -Suspect this is  secondary to fluid overload and less likely pneumonia, also had Covid earlier this year, could have residual findings secondary to that -Gram-positive cocci in 1 out of 2 blood cultures likely contaminant -Discontinued antibiotics  Nephrolithiasis, neurogenic bladder -Chronic right nephrostomy drain -Last exchanged at [facility] on 02/24/2020 -After discussion with the patient and the nephrology consultant we will pursue nephrostomy tube exchange during this admission.  The patient is in agreement when I stressed the importance of this.  Interventional radiology reconsulted, n.p.o. after midnight  Hyperphosphatemia -Likely noncompliant with phosphate binders, resume PhosLo 3 times daily with meals  Hypoglycemia -Resolved, hemoglobin A1c is 5   DVT prophylaxis: SQ heparin Code Status: FULL Family Communication: Mother [name] [phone] on 4/21 Disposition Plan: Status is: Inpatient  Remains inpatient appropriate because:Altered mental status, Unsafe d/c plan and Inpatient level of care appropriate due to severity of illness   Dispo: The patient is from: Home              Anticipated d/c is to: SNF vs home with home health              Patient currently is not medically stable to d/c.   Difficult to place patient Yes  Complex patient with ESRD and poor self-care at home.  Per last TOC note patient does have a bed offer at [facility] in [place].  Patient will need to discuss with his family members to see if he would be willing to accept a SNF bed.  Once patient has nephrostomy tube exchanged he will be medically ready for discharge.     Level of care: Progressive Cardiac  Consultants:   Nephrology  Psychiatry  Palliative care  Procedures: None Antimicrobials:   None   Subjective: Patient seen and examined.  Flattened affect, verbally does not endorse any complaints.  Objective: Vitals:   09/10/20 0945 09/10/20 1000 09/10/20 1015 09/10/20 1030  BP: (!) 166/88  (!) 169/86 (!) 167/95 (!) 149/108  Pulse:      Resp: '18 18 18 18  '$ Temp:      TempSrc:      SpO2:      Weight:      Height:       No intake or output data in the 24 hours ending 09/10/20 1042 Filed Weights   09/04/20 1945 09/07/20 0425  Weight: 54.4 kg 52.8 kg    Examination:  General exam: No acute distress.  Appears frail and chronically ill Respiratory system: Coarse breath sounds bilaterally.  Normal work of breathing.  Room air Cardiovascular system: S1-S2, regular rate and rhythm,+ murmur Gastrointestinal system: Thin, soft, nontender/nondistended, right nephrostomy drain Central nervous system: Alert, oriented times 2, no focal cranial nerve deficits  extremities: Muscle wasting noted bilaterally.  Decreased power diffusely.  Weak pulses bilateral lower extremities Skin: Scattered excoriations, dry scaly skin Psychiatry: Judgment and insight extremely poor.  Mood and affect agitated    Data Reviewed: I have personally reviewed following labs and imaging studies  CBC: Recent Labs  Lab 09/04/20 1950 09/05/20 0500 09/07/20 0552 09/08/20 0806  WBC 10.1 9.7 7.1 5.9  NEUTROABS 9.5*  --   --   --   HGB 12.0* 11.7* 10.5* 11.0*  HCT 39.4 36.0* 33.0* 35.7*  MCV 102.6* 97.6 100.0 100.6*  PLT 168 119* 78* 89*   Basic Metabolic Panel: Recent Labs  Lab 09/04/20 1950 09/05/20  [id number] 09/07/20 0552 09/08/20 0806  NA 145 140 139 140  K 7.2* 5.1 3.9 4.3  CL 107 92* 101 102  CO2 7* '26 24 23  '$ GLUCOSE 38* 474* 80 80  BUN 150* 125* 40* 51*  CREATININE 13.80* 11.77* 5.45* 6.77*  CALCIUM 9.3 8.2* 8.0* 7.9*  PHOS  --  11.3*  --  6.8*   GFR: Estimated Creatinine Clearance: 9.5 mL/min (A) (by C-G formula based on SCr of 6.77 mg/dL (H)). Liver Function Tests: Recent Labs  Lab 09/04/20 1950 09/05/20 0658 09/07/20 0552  AST 38  --  27  ALT 49*  --  36  ALKPHOS 64  --  59  BILITOT 1.8*  --  1.4*  PROT 8.6*  --  6.6  ALBUMIN 3.5 2.9* 2.5*   No results for input(s):  LIPASE, AMYLASE in the last 168 hours. No results for input(s): AMMONIA in the last 168 hours. Coagulation Profile: Recent Labs  Lab 09/05/20 0500  INR 1.6*   Cardiac Enzymes: No results for input(s): CKTOTAL, CKMB, CKMBINDEX, TROPONINI in the last 168 hours. BNP (last 3 results) No results for input(s): PROBNP in the last 8760 hours. HbA1C: No results for input(s): HGBA1C in the last 72 hours. CBG: Recent Labs  Lab 09/08/20 0842 09/08/20 1655 09/09/20 0753 09/09/20 1942 09/10/20 0822  GLUCAP 114* 83 139* 169* 74   Lipid Profile: No results for input(s): CHOL, HDL, LDLCALC, TRIG, CHOLHDL, LDLDIRECT in the last 72 hours. Thyroid Function Tests: No results for input(s): TSH, T4TOTAL, FREET4, T3FREE, THYROIDAB in the last 72 hours. Anemia Panel: No results for input(s): VITAMINB12, FOLATE, FERRITIN, TIBC, IRON, RETICCTPCT in the last 72 hours. Sepsis Labs: Recent Labs  Lab 09/04/20 1950 09/04/20 2235 09/05/20 0500  PROCALCITON  --   --  3.63  LATICACIDVEN 10.1* 3.1* 5.1*    Recent Results (from the past 240 hour(s))  Blood Culture (routine x 2)     Status: Abnormal   Collection Time: 09/04/20  7:50 PM   Specimen: BLOOD  Result Value Ref Range Status   Specimen Description   Final    BLOOD BLOOD LEFT FOREARM Performed at [facility], [address], [place], [place] [address]    Special Requests   Final    BOTTLES DRAWN AEROBIC AND ANAEROBIC Blood Culture adequate volume Performed at [facility], [address] [place], [place] [address]    Culture  Setup Time   Final    GRAM POSITIVE RODS AEROBIC BOTTLE ONLY CRITICAL RESULT CALLED TO, READ BACK BY AND VERIFIED WITH: Winfield Rast PATEL AT 2359 09/05/20 MF    Culture (A)  Final    CORYNEBACTERIUM SPECIES Standardized susceptibility testing for this organism is not available. Performed at [facility], [place] [address] [place], [place] [address]    Report Status 09/09/2020 FINAL  Final   Resp Panel by RT-PCR (Flu A&B, Covid) Nasopharyngeal Swab     Status: None   Collection Time: 09/04/20 10:52 PM   Specimen: Nasopharyngeal Swab; Nasopharyngeal(NP) swabs in vial transport medium  Result Value Ref Range Status   SARS Coronavirus 2 by RT PCR NEGATIVE NEGATIVE Final    Comment: (NOTE) SARS-CoV-2 target nucleic acids are NOT DETECTED.  The SARS-CoV-2 RNA is generally detectable in upper respiratory specimens during the acute phase of infection. The lowest concentration of SARS-CoV-2 viral copies this assay can detect is 138 copies/mL. A negative result does not preclude SARS-Cov-2 infection and should not be used as the sole  basis for treatment or other patient management decisions. A negative result may occur with  improper specimen collection/handling, submission of specimen other than nasopharyngeal swab, presence of viral mutation(s) within the areas targeted by this assay, and inadequate number of viral copies(<138 copies/mL). A negative result must be combined with clinical observations, patient history, and epidemiological information. The expected result is Negative.  Fact Sheet for Patients:  EntrepreneurPulse.com.au  Fact Sheet for Healthcare Providers:  IncredibleEmployment.be  This test is no t yet approved or cleared by the [place] FDA and  has been authorized for detection and/or diagnosis of SARS-CoV-2 by FDA under an Emergency Use Authorization (EUA). This EUA will remain  in effect (meaning this test can be used) for the duration of the COVID-19 declaration under Section 564(b)(1) of the Act, 21 U.S.C.section 360bbb-3(b)(1), unless the authorization is terminated  or revoked sooner.       Influenza A by PCR NEGATIVE NEGATIVE Final   Influenza B by PCR NEGATIVE NEGATIVE Final    Comment: (NOTE) The Xpert Xpress SARS-CoV-2/FLU/RSV plus assay is intended as an aid in the diagnosis of influenza from  Nasopharyngeal swab specimens and should not be used as a sole basis for treatment. Nasal washings and aspirates are unacceptable for Xpert Xpress SARS-CoV-2/FLU/RSV testing.  Fact Sheet for Patients: EntrepreneurPulse.com.au  Fact Sheet for Healthcare Providers: IncredibleEmployment.be  This test is not yet approved or cleared by the [place] FDA and has been authorized for detection and/or diagnosis of SARS-CoV-2 by FDA under an Emergency Use Authorization (EUA). This EUA will remain in effect (meaning this test can be used) for the duration of the COVID-19 declaration under Section 564(b)(1) of the Act, 21 U.S.C. section 360bbb-3(b)(1), unless the authorization is terminated or revoked.  Performed at [facility], [address] [place], [place] [address]   MRSA PCR Screening     Status: Abnormal   Collection Time: 09/10/20  1:57 AM   Specimen: Nasal Mucosa; Nasopharyngeal  Result Value Ref Range Status   MRSA by PCR POSITIVE (A) NEGATIVE Final    Comment:        The GeneXpert MRSA Assay (FDA approved for NASAL specimens only), is one component of a comprehensive MRSA colonization surveillance program. It is not intended to diagnose MRSA infection nor to guide or monitor treatment for MRSA infections. RESULT CALLED TO, READ BACK BY AND VERIFIED WITH: MARICAR KIMREY '@0320'$  ON 09/10/20 SKL Performed at [facility], [address] [place], [place] [address]          Radiology Studies: No results found.      Scheduled Meds: . albuterol  2.5 mg Nebulization Once  . Chlorhexidine Gluconate Cloth  6 each Topical Q0600  . heparin  5,000 Units Subcutaneous Q8H  . mupirocin ointment  1 application Nasal BID  . sevelamer carbonate  2,400 mg Oral TID WC  . sodium chloride flush  3 mL Intravenous Q12H   Continuous Infusions:   LOS: 6 days    Time spent: 15 minutes    Sidney Ace, MD [facility] Pager [phone]  If 7PM-7AM, please contact night-coverage 09/10/2020, 10:42 AM

## 2020-09-10 NOTE — Progress Notes (Signed)
Patient clinically stable post right Nephrostomy tube exchange with sedation per Dr Kathlene Cote, tolerated well. Awakens readily to spoken voice post procedure. Received Versed 2 mg along with Fentanyl 50 mcg IV for procedure. Dressing dry and intact post pro cedure. Report given to [place] post procedure in specials.

## 2020-09-10 NOTE — Progress Notes (Signed)
Patient's nephrostomy tube is leaking. Patient is unaware. Patient can be aggressive at times, but will apologize for behavior.

## 2020-09-10 NOTE — Progress Notes (Signed)
[facility]  ROUNDING NOTE   Subjective:   Patient seen during dialysis   HEMODIALYSIS FLOWSHEET:  Blood Flow Rate (mL/min): 400 mL/min Arterial Pressure (mmHg): -100 mmHg Venous Pressure (mmHg): 160 mmHg Transmembrane Pressure (mmHg): 70 mmHg Ultrafiltration Rate (mL/min): 330 mL/min Dialysate Flow Rate (mL/min): 500 ml/min Conductivity: Machine : 13.9 Conductivity: Machine : 13.9 Dialysis Fluid Bolus: Normal Saline Bolus Amount (mL): 250 mL Dialysate Change: 2K  No complaints at this time  Objective:  Vital signs in last 24 hours:  Temp:  [97.7 F (36.5 C)-98.2 F (36.8 C)] 98 F (36.7 C) (04/22 0900) Pulse Rate:  [79-95] 87 (04/22 1245) Resp:  [15-24] 15 (04/22 1245) BP: (143-181)/(84-108) 149/86 (04/22 1245) SpO2:  [95 %-100 %] 95 % (04/22 1245)  Weight change:  Filed Weights   09/04/20 1945 09/07/20 0425  Weight: 54.4 kg 52.8 kg    Intake/Output: No intake/output data recorded.   Intake/Output this shift:  Total I/O In: -  Out: 64 [Other:64]  Physical Exam: General: NAD, lying in bed   Head: Normocephalic, atraumatic. Moist oral mucosal membranes  Eyes: Anicteric  Lungs:  Clear bilaterally, normal breathing effort  Heart: Regular rate and rhythm   Abdomen:  Soft, nontender   Extremities:  no peripheral edema.  Neurologic: Alert and oriented   Skin: No lesions  Access: Right upper AVF     Basic Metabolic Panel: Recent Labs  Lab 09/04/20 1950 09/05/20 0658 09/07/20 0552 09/08/20 0806 09/10/20 1030  NA 145 140 139 140 138  K 7.2* 5.1 3.9 4.3 3.1*  CL 107 92* 101 102 101  CO2 7* '26 24 23 27  '$ GLUCOSE 38* 474* 80 80 79  BUN 150* 125* 40* 51* 23*  CREATININE 13.80* 11.77* 5.45* 6.77* 3.06*  CALCIUM 9.3 8.2* 8.0* 7.9* 7.6*  PHOS  --  11.3*  --  6.8*  --     Liver Function Tests: Recent Labs  Lab 09/04/20 1950 09/05/20 0658 09/07/20 0552  AST 38  --  27  ALT 49*  --  36  ALKPHOS 64  --  59  BILITOT 1.8*  --  1.4*   PROT 8.6*  --  6.6  ALBUMIN 3.5 2.9* 2.5*   No results for input(s): LIPASE, AMYLASE in the last 168 hours. No results for input(s): AMMONIA in the last 168 hours.  CBC: Recent Labs  Lab 09/04/20 1950 09/05/20 0500 09/07/20 0552 09/08/20 0806 09/10/20 1030  WBC 10.1 9.7 7.1 5.9 5.6  NEUTROABS 9.5*  --   --   --   --   HGB 12.0* 11.7* 10.5* 11.0* 10.1*  HCT 39.4 36.0* 33.0* 35.7* 32.0*  MCV 102.6* 97.6 100.0 100.6* 99.7  PLT 168 119* 78* 89* 108*    Cardiac Enzymes: No results for input(s): CKTOTAL, CKMB, CKMBINDEX, TROPONINI in the last 168 hours.  BNP: Invalid input(s): POCBNP  CBG: Recent Labs  Lab 09/08/20 0842 09/08/20 1655 09/09/20 0753 09/09/20 1942 09/10/20 0822  GLUCAP 114* 83 139* 169* 16    Microbiology: Results for orders placed or performed during the hospital encounter of 09/04/20  Blood Culture (routine x 2)     Status: Abnormal   Collection Time: 09/04/20  7:50 PM   Specimen: BLOOD  Result Value Ref Range Status   Specimen Description   Final    BLOOD BLOOD LEFT FOREARM Performed at [facility], [address] [place], [place] [address]    Special Requests   Final    BOTTLES DRAWN  AEROBIC AND ANAEROBIC Blood Culture adequate volume Performed at [facility], [address] [place], [place] [address]    Culture  Setup Time   Final    GRAM POSITIVE RODS AEROBIC BOTTLE ONLY CRITICAL RESULT CALLED TO, READ BACK BY AND VERIFIED WITH: Winfield Rast PATEL AT 2359 09/05/20 MF    Culture (A)  Final    CORYNEBACTERIUM SPECIES Standardized susceptibility testing for this organism is not available. Performed at [facility], [place] [address] [place], [place] [address]    Report Status 09/09/2020 FINAL  Final  Resp Panel by RT-PCR (Flu A&B, Covid) Nasopharyngeal Swab     Status: None   Collection Time: 09/04/20 10:52 PM   Specimen: Nasopharyngeal Swab; Nasopharyngeal(NP) swabs in vial transport medium  Result Value Ref Range  Status   SARS Coronavirus 2 by RT PCR NEGATIVE NEGATIVE Final    Comment: (NOTE) SARS-CoV-2 target nucleic acids are NOT DETECTED.  The SARS-CoV-2 RNA is generally detectable in upper respiratory specimens during the acute phase of infection. The lowest concentration of SARS-CoV-2 viral copies this assay can detect is 138 copies/mL. A negative result does not preclude SARS-Cov-2 infection and should not be used as the sole basis for treatment or other patient management decisions. A negative result may occur with  improper specimen collection/handling, submission of specimen other than nasopharyngeal swab, presence of viral mutation(s) within the areas targeted by this assay, and inadequate number of viral copies(<138 copies/mL). A negative result must be combined with clinical observations, patient history, and epidemiological information. The expected result is Negative.  Fact Sheet for Patients:  EntrepreneurPulse.com.au  Fact Sheet for Healthcare Providers:  IncredibleEmployment.be  This test is no t yet approved or cleared by the [place] FDA and  has been authorized for detection and/or diagnosis of SARS-CoV-2 by FDA under an Emergency Use Authorization (EUA). This EUA will remain  in effect (meaning this test can be used) for the duration of the COVID-19 declaration under Section 564(b)(1) of the Act, 21 U.S.C.section 360bbb-3(b)(1), unless the authorization is terminated  or revoked sooner.       Influenza A by PCR NEGATIVE NEGATIVE Final   Influenza B by PCR NEGATIVE NEGATIVE Final    Comment: (NOTE) The Xpert Xpress SARS-CoV-2/FLU/RSV plus assay is intended as an aid in the diagnosis of influenza from Nasopharyngeal swab specimens and should not be used as a sole basis for treatment. Nasal washings and aspirates are unacceptable for Xpert Xpress SARS-CoV-2/FLU/RSV testing.  Fact Sheet for  Patients: EntrepreneurPulse.com.au  Fact Sheet for Healthcare Providers: IncredibleEmployment.be  This test is not yet approved or cleared by the [place] FDA and has been authorized for detection and/or diagnosis of SARS-CoV-2 by FDA under an Emergency Use Authorization (EUA). This EUA will remain in effect (meaning this test can be used) for the duration of the COVID-19 declaration under Section 564(b)(1) of the Act, 21 U.S.C. section 360bbb-3(b)(1), unless the authorization is terminated or revoked.  Performed at [facility], [address] [place], [place] [address]   MRSA PCR Screening     Status: Abnormal   Collection Time: 09/10/20  1:57 AM   Specimen: Nasal Mucosa; Nasopharyngeal  Result Value Ref Range Status   MRSA by PCR POSITIVE (A) NEGATIVE Final    Comment:        The GeneXpert MRSA Assay (FDA approved for NASAL specimens only), is one component of a comprehensive MRSA colonization surveillance program. It is not intended to diagnose MRSA infection  nor to guide or monitor treatment for MRSA infections. RESULT CALLED TO, READ BACK BY AND VERIFIED WITH: MARICAR KIMREY '@0320'$  ON 09/10/20 SKL Performed at [facility], [address] [place], [place] [address]     Coagulation Studies: No results for input(s): LABPROT, INR in the last 72 hours.  Urinalysis: No results for input(s): COLORURINE, LABSPEC, PHURINE, GLUCOSEU, HGBUR, BILIRUBINUR, KETONESUR, PROTEINUR, UROBILINOGEN, NITRITE, LEUKOCYTESUR in the last 72 hours.  Invalid input(s): APPERANCEUR    Imaging: No results found.   Medications:    . albuterol  2.5 mg Nebulization Once  . Chlorhexidine Gluconate Cloth  6 each Topical Q0600  . fentaNYL      . heparin  5,000 Units Subcutaneous Q8H  . midazolam      . mupirocin ointment  1 application Nasal BID  . sevelamer carbonate  2,400 mg Oral TID WC  . sodium chloride flush  3 mL  Intravenous Q12H   acetaminophen **OR** acetaminophen, ondansetron **OR** ondansetron (ZOFRAN) IV, senna-docusate  Assessment/ Plan:  [name] is a 50 y.o.  male with ESRD on HD, HTN, stroke, right nephrostomy tube  who presents to [facility] on 09/04/20 for altered mental status. Patient was found to be hyperkalemic and hypoglycemic   [facility] Left AVF 49kg   1. ESRD on dialysis with hyperkalemia  - Scheduled for shortened dialysis treatment today - UF goal 1L  - Patient scheduled for procedure later this morning.  - Next scheduled treatment on Monday - Patient request his mother make his decisions if he is unable.   2. Anemia of CKD  - hgb 10.1 - at goal  3. Secondary hyperparathyroidism of renal origin - improved phos 6.8 - on renvela as an outpatient  - Renvela with meals    LOS: 6 Geralene Afshar 4/22/202212:55 PM

## 2020-09-10 NOTE — Procedures (Signed)
Interventional Radiology Procedure Note  Procedure: Right percutaneous nephrostomy tube change  Complications: None  Estimated Blood Loss: None  Findings: Old right PCN removed over wire and new 10 Fr catheter placed and formed in renal pelvis. Attached to new gravity bag.  Venetia Night. Kathlene Cote, M.D Pager:  [phone]

## 2020-09-11 LAB — GLUCOSE, CAPILLARY
Glucose-Capillary: 87 mg/dL (ref 70–99)
Glucose-Capillary: 95 mg/dL (ref 70–99)
Glucose-Capillary: 114 mg/dL — ABNORMAL HIGH (ref 70–99)
Glucose-Capillary: 154 mg/dL — ABNORMAL HIGH (ref 70–99)

## 2020-09-11 MED ORDER — SEVELAMER CARBONATE 800 MG PO TABS: 2400.0000 mg | ORAL_TABLET | Freq: Three times a day (TID) | ORAL | 0 refills | Status: AC

## 2020-09-11 NOTE — Plan of Care (Signed)

## 2020-09-11 NOTE — Discharge Instructions (Signed)
Hyperkalemia Hyperkalemia is when you have too much potassium in your blood. Potassium helps your body in many ways, but having too much can cause problems. If there is too much potassium in your blood, it can affect how your heart works. Potassium is normally removed from your body by your kidneys. Many things can cause the amount in your blood to be high. Medicines and other treatments can be used to bring the amount to a normal level. Treatment may need to be done in the hospital. Follow these instructions at home:  Take over-the-counter and prescription medicines only as told by your doctor.  Do not take any of the following unless your doctor says it is okay: ? Supplements. ? Natural products. ? Herbs. ? Vitamins.  Limit how much alcohol you drink as told by your doctor.  Do not use drugs. If you need help quitting, ask your doctor.  If you have kidney disease, you may need to follow a low-potassium diet. A food specialist (dietitian) can help you.  Keep all follow-up visits as told by your doctor. This is important.   Contact a doctor if:  Your heartbeat is not regular or is very slow.  You feel dizzy (light-headed).  You feel weak.  You feel sick to your stomach (nauseous).  You have tingling in your hands or feet.  You lose feeling (have numbness) in your hands or feet. Get help right away if:  You are short of breath.  You have chest pain.  You pass out (faint).  You cannot move your muscles. Summary  Hyperkalemia is when you have too much potassium in your blood.  Take over-the-counter and prescription medicines only as told by your doctor.  Limit how much alcohol you drink as told by your doctor.  Contact a doctor if your heartbeat is not regular. This information is not intended to replace advice given to you by your health care provider. Make sure you discuss any questions you have with your health care provider. Document Revised: 10/09/2019 Document  Reviewed: 10/09/2019 [employer] Patient Education  [employer].

## 2020-09-11 NOTE — Progress Notes (Signed)
Mobility Specialist - Progress Note   09/11/20 1400  Mobility  Activity Repositioned in chair ((bed))  Level of Assistance Minimal assist, patient does 75% or more  Assistive Device  (bed rails)  Distance Ambulated (ft) 0 ft  Mobility Response Tolerated well  Mobility performed by Mobility specialist  $Mobility charge 1 Mobility    Pt slouched down in bed and leaning severely to the R upon arrival. Mobility entered to see if pt would like to reposition, he is politely agreeable. MinA with use of bed rails. Pillow wedge support provided for extra comfort.    Kathee Delton Mobility Specialist 09/11/20, 2:23 PM

## 2020-09-11 NOTE — Progress Notes (Signed)
Pt picked up by EMS to be transported home at this time.  Brother notified.

## 2020-09-11 NOTE — Plan of Care (Signed)
  Problem: Education: Goal: Knowledge of General Education information will improve Description: Including pain rating scale, medication(s)/side effects and non-pharmacologic comfort measures 09/11/2020 1803 by Vivien Rota, RN Outcome: Adequate for Discharge 09/11/2020 1046 by Felis Quillin, Winifred Olive, RN Outcome: Progressing   Problem: Health Behavior/Discharge Planning: Goal: Ability to manage health-related needs will improve 09/11/2020 1803 by Vivien Rota, RN Outcome: Adequate for Discharge 09/11/2020 1046 by Vivien Rota, RN Outcome: Progressing   Problem: Clinical Measurements: Goal: Ability to maintain clinical measurements within normal limits will improve 09/11/2020 1803 by Vivien Rota, RN Outcome: Adequate for Discharge 09/11/2020 1046 by Vivien Rota, RN Outcome: Progressing Goal: Will remain free from infection 09/11/2020 1803 by Vivien Rota, RN Outcome: Adequate for Discharge 09/11/2020 1046 by Vivien Rota, RN Outcome: Progressing Goal: Diagnostic test results will improve 09/11/2020 1803 by Vivien Rota, RN Outcome: Adequate for Discharge 09/11/2020 1046 by Vivien Rota, RN Outcome: Progressing Goal: Respiratory complications will improve 09/11/2020 1803 by Vivien Rota, RN Outcome: Adequate for Discharge 09/11/2020 1046 by Vivien Rota, RN Outcome: Progressing Goal: Cardiovascular complication will be avoided 09/11/2020 1803 by Vivien Rota, RN Outcome: Adequate for Discharge 09/11/2020 1046 by Vivien Rota, RN Outcome: Progressing   Problem: Activity: Goal: Risk for activity intolerance will decrease 09/11/2020 1803 by Vivien Rota, RN Outcome: Adequate for Discharge 09/11/2020 1046 by Vivien Rota, RN Outcome: Progressing   Problem: Nutrition: Goal: Adequate nutrition will be maintained 09/11/2020 1803 by Vivien Rota, RN Outcome:  Adequate for Discharge 09/11/2020 1046 by Vivien Rota, RN Outcome: Progressing   Problem: Coping: Goal: Level of anxiety will decrease 09/11/2020 1803 by Vivien Rota, RN Outcome: Adequate for Discharge 09/11/2020 1046 by Vivien Rota, RN Outcome: Progressing   Problem: Elimination: Goal: Will not experience complications related to bowel motility 09/11/2020 1803 by Vivien Rota, RN Outcome: Adequate for Discharge 09/11/2020 1046 by Vivien Rota, RN Outcome: Progressing Goal: Will not experience complications related to urinary retention 09/11/2020 1803 by Vivien Rota, RN Outcome: Adequate for Discharge 09/11/2020 1046 by Vivien Rota, RN Outcome: Progressing   Problem: Pain Managment: Goal: General experience of comfort will improve 09/11/2020 1803 by Vivien Rota, RN Outcome: Adequate for Discharge 09/11/2020 1046 by Vivien Rota, RN Outcome: Progressing   Problem: Safety: Goal: Ability to remain free from injury will improve 09/11/2020 1803 by Vivien Rota, RN Outcome: Adequate for Discharge 09/11/2020 1046 by Vivien Rota, RN Outcome: Progressing   Problem: Skin Integrity: Goal: Risk for impaired skin integrity will decrease 09/11/2020 1803 by Vivien Rota, RN Outcome: Adequate for Discharge 09/11/2020 1046 by Vivien Rota, RN Outcome: Progressing

## 2020-09-11 NOTE — Discharge Summary (Signed)
Physician Discharge Summary  Larry Morrison [id number] DOB: [birth year] DOA: 09/04/2020  PCP: [facility] [place] date: 09/04/2020 Discharge date: 09/11/2020  Admitted From: Home Disposition: Home with home health services  Recommendations for Outpatient Follow-up:  1. Follow up with PCP in 1-2 weeks 2. Follow-up with nephrology within 2 weeks of discharge:  Home Health: Yes Equipment/Devices: Right nephrostomy, exchange 4/22 Discharge Condition: Stable CODE STATUS: Full Diet recommendation: Renal Brief/Interim Summary: Chronically ill noncompliant 50 year old African-American male with history of CVA, ESRD on hemodialysis, chronic right nephrostomy drain, nephrolithiasis, neurogenic bladder who was brought to the ED 4/16, EMS was called as family found him altered, when EMS arrived patient was covered in feces urine and maggots. -Very poor, limited historian -In the ED he was noted to be hypertensive, tachypneic, O2 sats dropped to 87%, labs were notable for potassium of 7.2, bicarb of 7, BUN of 150, chest x-ray noted bilateral lower lobe opacities left greater than right with probable small effusions. -Previously admitted to [facility] in March after missing hemodialysis for 3 to 4 weeks -Patient has been agitated and angry, refusing nursing care and cursing at staff including palliative care and psychiatry consult -When palliative care discussed with the patient's mother she requested transfer to [facility] as the physicians know him there however at this point there is no medically justifiable reason for transfer -When I discussed with the patient he says he only wants to go home and has no interest in any rehab or nursing facility  4/21: Patient more calm on my evaluation today.  We had a lengthy conversation about his overall state of health and the very high likelihood of bounce back should we discharge him home.  He is unable to provide a clear response when asking these  questions.  Per psychiatry evaluation the patient does not have capacity however he was basically unwilling to speak to a psychiatric consult.  I also discussed the case with nephrology.  Disposition remains a very large challenge.  I also discussed with patient the need for nephrostomy tube exchange which he is in agreement with.  Interventional radiology reconsulted.  4/22: No status changes.  Patient n.p.o. for nephrostomy tube exchange today hopefully.  Discussed with interventional radiology.  They will try to get today.  If unable then will likely monitor patient over weekend and have tube placed on Monday  4/23: Patient remains medically stable.  Successful exchange of right nephrostomy tube on 4/22.  Appreciate IR assistance.  Patient will need to follow-up with his established physicians at [facility] for more definitive management of right nephrostomy.  I had a lengthy discussion with the patient at bedside.  I explained that our medical team feels that skilled nursing facility would be the most appropriate disposition plan to ensure safety and minimize the risk of future admissions.    Confirmed that patient had full understanding of my recommendations.  He adamantly refused skilled nursing facility placement and stated he simply wanted to go home.  I explained to him that at this point he is medically stable and that if he wanted to go home we could discharge him as soon as today.  He responded affirmatively and said "I just want to get out here".  Patient is medically stable for discharge home at this time.  Will attempt to secure home health services.  Despite this given patient's medical complexity and chronic comorbidities he remains a very high risk for readmission.  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  SNF not an option at this point, and patient refusing anyways -Called patient's mother and emphasized need for closer supervision, she knows he needs dialysis to survive and wishes [place] to do everything to keep him alive -Psychiatry consulted for capacity evaluation however patient was extremely agitated and cursing and psychiatry consult -Main issue is noncompliance with dialysis,palliative consulted -Nephrology following for inpatient hemodialysis 4/21: Patient in agreement with nephrostomy tube exchange 4/22: Right nephrostomy tube exchange successfully with interventional radiology Discharge plan: Resume outpatient hemodialysis schedule Follow-up with physicians at [facility]   Hyperkalemia ESRD on hemodialysis -Estimated dry weight is 50.5 kg per DC summary from [facility] -Missed HD for about2-3 weeks now, was last hospitalized at [facility] in March, reportedly had missed dialysis for 1 month prior to that -Hyperkalemia improved -Dialyzed 4/18 -Dialyzed 4/20 - Plan: Potassium stable at time of discharge  Bilateral lung opacities -Suspect this is secondary to fluid overload and less likely pneumonia, also had Covid earlier this year, could have residual findings secondary to that -Gram-positive cocci in 1 out of 2 blood cultures likely contaminant -Discontinuedantibiotics  Nephrolithiasis, neurogenic bladder -Chronic right nephrostomy drain -Last exchanged at [facility] on 02/24/2020 -IR consulted during this admission.  Right nephrostomy tubes changed successfully on 4/22 -Follow-up with established  physicians at [facility]  Hyperphosphatemia -Likely noncompliant with phosphate binders, resume PhosLo 3 times daily with meals  Hypoglycemia -Resolved,hemoglobin A1c is 5  Discharge Instructions  Discharge Instructions    Diet - low sodium heart healthy   Complete by: As directed    Increase activity slowly   Complete by: As directed      Allergies as of 09/11/2020      Reactions   Vancomycin    Patient denies - repeated denial to pharm tech 09-05-2020      Medication List    TAKE these medications   sevelamer carbonate 800 MG tablet Commonly known as: RENVELA Take 3 tablets (2,400 mg total) by mouth 3 (three) times daily with meals.       Follow-up Information    [facility] [place]. Schedule an appointment as soon as possible for a visit in 1 week(s).   Contact information: [address] [place] [address] [phone]        Lavonia Dana, MD. Schedule an appointment as soon as possible for a visit in 1 week(s).   Specialty: Nephrology Contact information: [address] [place] [address] [phone]              Allergies  Allergen Reactions  . Vancomycin     Patient denies - repeated denial to pharm tech 09-05-2020    Consultations:  Nephrology  IR   Procedures/Studies: [facility] Chest Port 1 View  Result Date: 09/04/2020 CLINICAL DATA:  Questionable sepsis EXAM: PORTABLE CHEST 1 VIEW COMPARISON:  06/18/2020 FINDINGS: Cardiomegaly. Bilateral lower lobe airspace opacities, left greater than right. Suspect layering effusions. Findings concerning for pneumonia. No acute bony abnormality. IMPRESSION: Bilateral lower  lobe opacities, left greater than right with probable small effusions. Findings concerning for pneumonia. Electronically Signed   By: Rolm Baptise M.D.   On: 09/04/2020 20:38   IR NEPHROSTOMY EXCHANGE RIGHT  Result Date: 09/10/2020 INDICATION: History of previous right percutaneous nephrostomy tube  placement at outside institution last October. The tube has not been changed since that time and the patient is still producing urine from the right kidney despite being on hemodialysis. History of renal obstruction due to calculi. EXAM: RIGHT PERCUTANEOUS NEPHROSTOMY TUBE EXCHANGE UNDER FLUOROSCOPY COMPARISON:  None. MEDICATIONS: None ANESTHESIA/SEDATION: Fentanyl 50 mcg IV; Versed 2.0 mg IV Moderate Sedation Time:  10 minutes The patient was continuously monitored during the procedure by the interventional radiology nurse under my direct supervision. CONTRAST:  10 mL Isovue 320-administered into the collecting system(s) FLUOROSCOPY TIME:  Fluoroscopy Time: 24 seconds.  2.4 mGy. COMPLICATIONS: None immediate. PROCEDURE: Informed written consent was obtained from the patient after a thorough discussion of the procedural risks, benefits and alternatives. All questions were addressed. Maximal Sterile Barrier Technique was utilized including caps, mask, sterile gowns, sterile gloves, sterile drape, hand hygiene and skin antiseptic. A timeout was performed prior to the initiation of the procedure. The pre-existing right nephrostomy tube was injected with contrast material under fluoroscopy. The catheter was cut and removed over a guidewire. A new 10 French nephrostomy tube was advanced over the wire and formed in the renal pelvis. Position was confirmed by fluoroscopy. The catheter was secured at the skin with a Prolene retention suture and StatLock device. It was connected to a new gravity drainage bag. IMPRESSION: Exchange of indwelling right 10 French percutaneous nephrostomy tube under fluoroscopy. Electronically Signed   By: Aletta Edouard M.D.   On: 09/10/2020 13:19    (Echo, Carotid, EGD, Colonoscopy, ERCP)    Subjective: Patient seen and examined on the day of discharge.  Stable, no distress.  Demonstrated full understanding of discharge instructions and recommendations.  Stable for  discharge  Discharge Exam: Vitals:   09/11/20 0258 09/11/20 0755  BP: (!) 145/94 136/85  Pulse: 93 91  Resp: 20 18  Temp: 97.7 F (36.5 C) 97.8 F (36.6 C)  SpO2: 97% 100%   Vitals:   09/10/20 1327 09/10/20 1559 09/11/20 0258 09/11/20 0755  BP: (!) 154/90 140/84 (!) 145/94 136/85  Pulse: 91 91 93 91  Resp: '16 18 20 18  '$ Temp: (!) 97.5 F (36.4 C) 97.6 F (36.4 C) 97.7 F (36.5 C) 97.8 F (36.6 C)  TempSrc: Oral Oral Oral Oral  SpO2: 98% 99% 97% 100%  Weight:      Height:        General: No acute distress.  Chronically ill Cardiovascular: S1-S2, regular rate and rhythm,+ murmur Respiratory: Poor respiratory effort.  Lungs clear.  Normal work of breathing.  Room air Abdominal: Thin, scaphoid, normal bowel sounds, nontender Extremities: no edema, no cyanosis    The results of significant diagnostics from this hospitalization (including imaging, microbiology, ancillary and laboratory) are listed below for reference.     Microbiology: Recent Results (from the past 240 hour(s))  Blood Culture (routine x 2)     Status: Abnormal   Collection Time: 09/04/20  7:50 PM   Specimen: BLOOD  Result Value Ref Range Status   Specimen Description   Final    BLOOD BLOOD LEFT FOREARM Performed at [facility], [address], [place], [place] [address]    Special Requests   Final    BOTTLES DRAWN AEROBIC  AND ANAEROBIC Blood Culture adequate volume Performed at [facility], [address] [place], [place] [address]    Culture  Setup Time   Final    GRAM POSITIVE RODS AEROBIC BOTTLE ONLY CRITICAL RESULT CALLED TO, READ BACK BY AND VERIFIED WITH: Winfield Rast PATEL AT 2359 09/05/20 MF    Culture (A)  Final    CORYNEBACTERIUM SPECIES Standardized susceptibility testing for this organism is not available. Performed at [facility], [place] [address] [place], [place] [address]    Report Status 09/09/2020 FINAL  Final  Resp Panel by RT-PCR (Flu A&B, Covid)  Nasopharyngeal Swab     Status: None   Collection Time: 09/04/20 10:52 PM   Specimen: Nasopharyngeal Swab; Nasopharyngeal(NP) swabs in vial transport medium  Result Value Ref Range Status   SARS Coronavirus 2 by RT PCR NEGATIVE NEGATIVE Final    Comment: (NOTE) SARS-CoV-2 target nucleic acids are NOT DETECTED.  The SARS-CoV-2 RNA is generally detectable in upper respiratory specimens during the acute phase of infection. The lowest concentration of SARS-CoV-2 viral copies this assay can detect is 138 copies/mL. A negative result does not preclude SARS-Cov-2 infection and should not be used as the sole basis for treatment or other patient management decisions. A negative result may occur with  improper specimen collection/handling, submission of specimen other than nasopharyngeal swab, presence of viral mutation(s) within the areas targeted by this assay, and inadequate number of viral copies(<138 copies/mL). A negative result must be combined with clinical observations, patient history, and epidemiological information. The expected result is Negative.  Fact Sheet for Patients:  EntrepreneurPulse.com.au  Fact Sheet for Healthcare Providers:  IncredibleEmployment.be  This test is no t yet approved or cleared by the [place] FDA and  has been authorized for detection and/or diagnosis of SARS-CoV-2 by FDA under an Emergency Use Authorization (EUA). This EUA will remain  in effect (meaning this test can be used) for the duration of the COVID-19 declaration under Section 564(b)(1) of the Act, 21 U.S.C.section 360bbb-3(b)(1), unless the authorization is terminated  or revoked sooner.       Influenza A by PCR NEGATIVE NEGATIVE Final   Influenza B by PCR NEGATIVE NEGATIVE Final    Comment: (NOTE) The Xpert Xpress SARS-CoV-2/FLU/RSV plus assay is intended as an aid in the diagnosis of influenza from Nasopharyngeal swab specimens and should not be  used as a sole basis for treatment. Nasal washings and aspirates are unacceptable for Xpert Xpress SARS-CoV-2/FLU/RSV testing.  Fact Sheet for Patients: EntrepreneurPulse.com.au  Fact Sheet for Healthcare Providers: IncredibleEmployment.be  This test is not yet approved or cleared by the [place] FDA and has been authorized for detection and/or diagnosis of SARS-CoV-2 by FDA under an Emergency Use Authorization (EUA). This EUA will remain in effect (meaning this test can be used) for the duration of the COVID-19 declaration under Section 564(b)(1) of the Act, 21 U.S.C. section 360bbb-3(b)(1), unless the authorization is terminated or revoked.  Performed at [facility], [address] [place], [place] [address]   MRSA PCR Screening     Status: Abnormal   Collection Time: 09/10/20  1:57 AM   Specimen: Nasal Mucosa; Nasopharyngeal  Result Value Ref Range Status   MRSA by PCR POSITIVE (A) NEGATIVE Final    Comment:        The GeneXpert MRSA Assay (FDA approved for NASAL specimens only), is one component of a comprehensive MRSA colonization surveillance program. It is not intended to diagnose MRSA infection nor  to guide or monitor treatment for MRSA infections. RESULT CALLED TO, READ BACK BY AND VERIFIED WITH: MARICAR KIMREY '@0320'$  ON 09/10/20 SKL Performed at [facility], [address] [place], [place] [address]      Labs: BNP (last 3 results) No results for input(s): BNP in the last 8760 hours. Basic Metabolic Panel: Recent Labs  Lab 09/04/20 1950 09/05/20 0658 09/07/20 0552 09/08/20 0806 09/10/20 1030  NA 145 140 139 140 138  K 7.2* 5.1 3.9 4.3 3.1*  CL 107 92* 101 102 101  CO2 7* '26 24 23 27  '$ GLUCOSE 38* 474* 80 80 79  BUN 150* 125* 40* 51* 23*  CREATININE 13.80* 11.77* 5.45* 6.77* 3.06*  CALCIUM 9.3 8.2* 8.0* 7.9* 7.6*  PHOS  --  11.3*  --  6.8*  --    Liver Function Tests: Recent Labs  Lab  09/04/20 1950 09/05/20 0658 09/07/20 0552  AST 38  --  27  ALT 49*  --  36  ALKPHOS 64  --  59  BILITOT 1.8*  --  1.4*  PROT 8.6*  --  6.6  ALBUMIN 3.5 2.9* 2.5*   No results for input(s): LIPASE, AMYLASE in the last 168 hours. No results for input(s): AMMONIA in the last 168 hours. CBC: Recent Labs  Lab 09/04/20 1950 09/05/20 0500 09/07/20 0552 09/08/20 0806 09/10/20 1030  WBC 10.1 9.7 7.1 5.9 5.6  NEUTROABS 9.5*  --   --   --   --   HGB 12.0* 11.7* 10.5* 11.0* 10.1*  HCT 39.4 36.0* 33.0* 35.7* 32.0*  MCV 102.6* 97.6 100.0 100.6* 99.7  PLT 168 119* 78* 89* 108*   Cardiac Enzymes: No results for input(s): CKTOTAL, CKMB, CKMBINDEX, TROPONINI in the last 168 hours. BNP: Invalid input(s): POCBNP CBG: Recent Labs  Lab 09/10/20 0822 09/10/20 1335 09/10/20 1426 09/10/20 1601 09/11/20 0757  GLUCAP 74 66* 73 135* 87   D-Dimer No results for input(s): DDIMER in the last 72 hours. Hgb A1c No results for input(s): HGBA1C in the last 72 hours. Lipid Profile No results for input(s): CHOL, HDL, LDLCALC, TRIG, CHOLHDL, LDLDIRECT in the last 72 hours. Thyroid function studies No results for input(s): TSH, T4TOTAL, T3FREE, THYROIDAB in the last 72 hours.  Invalid input(s): FREET3 Anemia work up No results for input(s): VITAMINB12, FOLATE, FERRITIN, TIBC, IRON, RETICCTPCT in the last 72 hours. Urinalysis    Component Value Date/Time   COLORURINE YELLOW (A) 09/04/2020 1937   APPEARANCEUR CLOUDY (A) 09/04/2020 1937   APPEARANCEUR Turbid 09/19/2014 1914   LABSPEC 1.015 09/04/2020 1937   LABSPEC 1.011 09/19/2014 1914   PHURINE 8.0 09/04/2020 1937   GLUCOSEU 150 (A) 09/04/2020 1937   GLUCOSEU Negative 09/19/2014 1914   HGBUR MODERATE (A) 09/04/2020 1937   BILIRUBINUR NEGATIVE 09/04/2020 1937   BILIRUBINUR Negative 09/19/2014 1914   KETONESUR NEGATIVE 09/04/2020 1937   PROTEINUR >=300 (A) 09/04/2020 1937   NITRITE NEGATIVE 09/04/2020 1937   LEUKOCYTESUR LARGE (A)  09/04/2020 1937   LEUKOCYTESUR 2+ 09/19/2014 1914   Sepsis Labs Invalid input(s): PROCALCITONIN,  WBC,  LACTICIDVEN Microbiology Recent Results (from the past 240 hour(s))  Blood Culture (routine x 2)     Status: Abnormal   Collection Time: 09/04/20  7:50 PM   Specimen: BLOOD  Result Value Ref Range Status   Specimen Description   Final    BLOOD BLOOD LEFT FOREARM Performed at [facility], [address] [place], [place] [address]    Special Requests  Final    BOTTLES DRAWN AEROBIC AND ANAEROBIC Blood Culture adequate volume Performed at [facility], [address] [place], [place] [address]    Culture  Setup Time   Final    GRAM POSITIVE RODS AEROBIC BOTTLE ONLY CRITICAL RESULT CALLED TO, READ BACK BY AND VERIFIED WITH: Winfield Rast PATEL AT 2359 09/05/20 MF    Culture (A)  Final    CORYNEBACTERIUM SPECIES Standardized susceptibility testing for this organism is not available. Performed at [facility], [place] [address] [place], [place] [address]    Report Status 09/09/2020 FINAL  Final  Resp Panel by RT-PCR (Flu A&B, Covid) Nasopharyngeal Swab     Status: None   Collection Time: 09/04/20 10:52 PM   Specimen: Nasopharyngeal Swab; Nasopharyngeal(NP) swabs in vial transport medium  Result Value Ref Range Status   SARS Coronavirus 2 by RT PCR NEGATIVE NEGATIVE Final    Comment: (NOTE) SARS-CoV-2 target nucleic acids are NOT DETECTED.  The SARS-CoV-2 RNA is generally detectable in upper respiratory specimens during the acute phase of infection. The lowest concentration of SARS-CoV-2 viral copies this assay can detect is 138 copies/mL. A negative result does not preclude SARS-Cov-2 infection and should not be used as the sole basis for treatment or other patient management decisions. A negative result may occur with  improper specimen collection/handling, submission of specimen other than nasopharyngeal swab, presence of viral mutation(s) within  the areas targeted by this assay, and inadequate number of viral copies(<138 copies/mL). A negative result must be combined with clinical observations, patient history, and epidemiological information. The expected result is Negative.  Fact Sheet for Patients:  EntrepreneurPulse.com.au  Fact Sheet for Healthcare Providers:  IncredibleEmployment.be  This test is no t yet approved or cleared by the [place] FDA and  has been authorized for detection and/or diagnosis of SARS-CoV-2 by FDA under an Emergency Use Authorization (EUA). This EUA will remain  in effect (meaning this test can be used) for the duration of the COVID-19 declaration under Section 564(b)(1) of the Act, 21 U.S.C.section 360bbb-3(b)(1), unless the authorization is terminated  or revoked sooner.       Influenza A by PCR NEGATIVE NEGATIVE Final   Influenza B by PCR NEGATIVE NEGATIVE Final    Comment: (NOTE) The Xpert Xpress SARS-CoV-2/FLU/RSV plus assay is intended as an aid in the diagnosis of influenza from Nasopharyngeal swab specimens and should not be used as a sole basis for treatment. Nasal washings and aspirates are unacceptable for Xpert Xpress SARS-CoV-2/FLU/RSV testing.  Fact Sheet for Patients: EntrepreneurPulse.com.au  Fact Sheet for Healthcare Providers: IncredibleEmployment.be  This test is not yet approved or cleared by the [place] FDA and has been authorized for detection and/or diagnosis of SARS-CoV-2 by FDA under an Emergency Use Authorization (EUA). This EUA will remain in effect (meaning this test can be used) for the duration of the COVID-19 declaration under Section 564(b)(1) of the Act, 21 U.S.C. section 360bbb-3(b)(1), unless the authorization is terminated or revoked.  Performed at [facility], [address] [place],  [address]   MRSA PCR Screening     Status: Abnormal    Collection Time: 09/10/20  1:57 AM   Specimen: Nasal Mucosa; Nasopharyngeal  Result Value Ref Range Status   MRSA by PCR POSITIVE (A) NEGATIVE Final    Comment:        The GeneXpert MRSA Assay (FDA approved for NASAL specimens only), is one component of a comprehensive MRSA colonization surveillance program. It is  not intended to diagnose MRSA infection nor to guide or monitor treatment for MRSA infections. RESULT CALLED TO, READ BACK BY AND VERIFIED WITH: MARICAR KIMREY '@0320'$  ON 09/10/20 SKL Performed at [facility], [address] [place], [place] [address]      Time coordinating discharge: Over 30 minutes  SIGNED:   Sidney Ace, MD  [facility] 09/11/2020, 10:57 AM Pager   If 7PM-7AM, please contact night-coverage

## 2020-09-11 NOTE — Progress Notes (Signed)
[facility]  ROUNDING NOTE   Subjective:   Patient seen today on second floor Patient resting comfortably in the bed Patient offers no new specific physical complaints No complaint of chest pain/shortness of breath/nausea/vomiting Patient main concern in today visit was if he could go home?    Objective:  Vital signs in last 24 hours:  Temp:  [97.6 F (36.4 C)-98.5 F (36.9 C)] 98.5 F (36.9 C) (04/23 1147) Pulse Rate:  [90-93] 90 (04/23 1147) Resp:  [18-20] 18 (04/23 1147) BP: (132-145)/(76-94) 132/76 (04/23 1147) SpO2:  [97 %-100 %] 97 % (04/23 1147)  Weight change:  Filed Weights   09/04/20 1945 09/07/20 0425  Weight: 54.4 kg 52.8 kg    Intake/Output: I/O last 3 completed shifts: In: 360 [P.O.:360] Out: 114 [Urine:50; Other:64]   Intake/Output this shift:  Total I/O In: 480 [P.O.:480] Out: 150 [Urine:150]  Physical Exam: General: NAD, lying in bed   Head: Normocephalic, atraumatic. Moist oral mucosal membranes  Eyes: Anicteric  Lungs:  Clear bilaterally, normal breathing effort  Heart: Regular rate and rhythm   Abdomen:  Soft, nontender   Extremities:  no peripheral edema.  Neurologic: Alert and oriented   Skin: No lesions  Access: Right upper AVF     Basic Metabolic Panel: Recent Labs  Lab 09/04/20 1950 09/05/20 0658 09/07/20 0552 09/08/20 0806 09/10/20 1030  NA 145 140 139 140 138  K 7.2* 5.1 3.9 4.3 3.1*  CL 107 92* 101 102 101  CO2 7* '26 24 23 27  '$ GLUCOSE 38* 474* 80 80 79  BUN 150* 125* 40* 51* 23*  CREATININE 13.80* 11.77* 5.45* 6.77* 3.06*  CALCIUM 9.3 8.2* 8.0* 7.9* 7.6*  PHOS  --  11.3*  --  6.8*  --     Liver Function Tests: Recent Labs  Lab 09/04/20 1950 09/05/20 0658 09/07/20 0552  AST 38  --  27  ALT 49*  --  36  ALKPHOS 64  --  59  BILITOT 1.8*  --  1.4*  PROT 8.6*  --  6.6  ALBUMIN 3.5 2.9* 2.5*   No results for input(s): LIPASE, AMYLASE in the last 168 hours. No results for input(s): AMMONIA in the  last 168 hours.  CBC: Recent Labs  Lab 09/04/20 1950 09/05/20 0500 09/07/20 0552 09/08/20 0806 09/10/20 1030  WBC 10.1 9.7 7.1 5.9 5.6  NEUTROABS 9.5*  --   --   --   --   HGB 12.0* 11.7* 10.5* 11.0* 10.1*  HCT 39.4 36.0* 33.0* 35.7* 32.0*  MCV 102.6* 97.6 100.0 100.6* 99.7  PLT 168 119* 78* 89* 108*    Cardiac Enzymes: No results for input(s): CKTOTAL, CKMB, CKMBINDEX, TROPONINI in the last 168 hours.  BNP: Invalid input(s): POCBNP  CBG: Recent Labs  Lab 09/10/20 1335 09/10/20 1426 09/10/20 1601 09/11/20 0757 09/11/20 1150  GLUCAP 66* 73 135* 22 95    Microbiology: Results for orders placed or performed during the hospital encounter of 09/04/20  Blood Culture (routine x 2)     Status: Abnormal   Collection Time: 09/04/20  7:50 PM   Specimen: BLOOD  Result Value Ref Range Status   Specimen Description   Final    BLOOD BLOOD LEFT FOREARM Performed at [facility], [address] [place], [place] [address]    Special Requests   Final    BOTTLES DRAWN AEROBIC AND ANAEROBIC Blood Culture adequate volume Performed at [facility], [address], [place], [place] [address]  Culture  Setup Time   Final    GRAM POSITIVE RODS AEROBIC BOTTLE ONLY CRITICAL RESULT CALLED TO, READ BACK BY AND VERIFIED WITH: Winfield Rast PATEL AT 2359 09/05/20 MF    Culture (A)  Final    CORYNEBACTERIUM SPECIES Standardized susceptibility testing for this organism is not available. Performed at [facility], [place] [address], [place], [place] [address]    Report Status 09/09/2020 FINAL  Final  Resp Panel by RT-PCR (Flu A&B, Covid) Nasopharyngeal Swab     Status: None   Collection Time: 09/04/20 10:52 PM   Specimen: Nasopharyngeal Swab; Nasopharyngeal(NP) swabs in vial transport medium  Result Value Ref Range Status   SARS Coronavirus 2 by RT PCR NEGATIVE NEGATIVE Final    Comment: (NOTE) SARS-CoV-2 target nucleic acids are NOT DETECTED.  The SARS-CoV-2 RNA is  generally detectable in upper respiratory specimens during the acute phase of infection. The lowest concentration of SARS-CoV-2 viral copies this assay can detect is 138 copies/mL. A negative result does not preclude SARS-Cov-2 infection and should not be used as the sole basis for treatment or other patient management decisions. A negative result may occur with  improper specimen collection/handling, submission of specimen other than nasopharyngeal swab, presence of viral mutation(s) within the areas targeted by this assay, and inadequate number of viral copies(<138 copies/mL). A negative result must be combined with clinical observations, patient history, and epidemiological information. The expected result is Negative.  Fact Sheet for Patients:  EntrepreneurPulse.com.au  Fact Sheet for Healthcare Providers:  IncredibleEmployment.be  This test is no t yet approved or cleared by the [place] FDA and  has been authorized for detection and/or diagnosis of SARS-CoV-2 by FDA under an Emergency Use Authorization (EUA). This EUA will remain  in effect (meaning this test can be used) for the duration of the COVID-19 declaration under Section 564(b)(1) of the Act, 21 U.S.C.section 360bbb-3(b)(1), unless the authorization is terminated  or revoked sooner.       Influenza A by PCR NEGATIVE NEGATIVE Final   Influenza B by PCR NEGATIVE NEGATIVE Final    Comment: (NOTE) The Xpert Xpress SARS-CoV-2/FLU/RSV plus assay is intended as an aid in the diagnosis of influenza from Nasopharyngeal swab specimens and should not be used as a sole basis for treatment. Nasal washings and aspirates are unacceptable for Xpert Xpress SARS-CoV-2/FLU/RSV testing.  Fact Sheet for Patients: EntrepreneurPulse.com.au  Fact Sheet for Healthcare Providers: IncredibleEmployment.be  This test is not yet approved or cleared by the [place] FDA and has been authorized for detection and/or diagnosis of SARS-CoV-2 by FDA under an Emergency Use Authorization (EUA). This EUA will remain in effect (meaning this test can be used) for the duration of the COVID-19 declaration under Section 564(b)(1) of the Act, 21 U.S.C. section 360bbb-3(b)(1), unless the authorization is terminated or revoked.  Performed at [facility], [address] [place], [place] [address]   MRSA PCR Screening     Status: Abnormal   Collection Time: 09/10/20  1:57 AM   Specimen: Nasal Mucosa; Nasopharyngeal  Result Value Ref Range Status   MRSA by PCR POSITIVE (A) NEGATIVE Final    Comment:        The GeneXpert MRSA Assay (FDA approved for NASAL specimens only), is one component of a comprehensive MRSA colonization surveillance program. It is not intended to diagnose MRSA infection nor to guide or monitor treatment for MRSA infections. RESULT CALLED TO, READ BACK BY AND VERIFIED WITH: MARICAR KIMREY '@0320'$  ON  09/10/20 SKL Performed at [facility], [address] [place], [place] [address]     Coagulation Studies: No results for input(s): LABPROT, INR in the last 72 hours.  Urinalysis: No results for input(s): COLORURINE, LABSPEC, PHURINE, GLUCOSEU, HGBUR, BILIRUBINUR, KETONESUR, PROTEINUR, UROBILINOGEN, NITRITE, LEUKOCYTESUR in the last 72 hours.  Invalid input(s): APPERANCEUR    Imaging: IR NEPHROSTOMY EXCHANGE RIGHT  Result Date: 09/10/2020 INDICATION: History of previous right percutaneous nephrostomy tube placement at outside institution last October. The tube has not been changed since that time and the patient is still producing urine from the right kidney despite being on hemodialysis. History of renal obstruction due to calculi. EXAM: RIGHT PERCUTANEOUS NEPHROSTOMY TUBE EXCHANGE UNDER FLUOROSCOPY COMPARISON:  None. MEDICATIONS: None ANESTHESIA/SEDATION: Fentanyl 50 mcg IV; Versed 2.0 mg IV Moderate Sedation  Time:  10 minutes The patient was continuously monitored during the procedure by the interventional radiology nurse under my direct supervision. CONTRAST:  10 mL Isovue 320-administered into the collecting system(s) FLUOROSCOPY TIME:  Fluoroscopy Time: 24 seconds.  2.4 mGy. COMPLICATIONS: None immediate. PROCEDURE: Informed written consent was obtained from the patient after a thorough discussion of the procedural risks, benefits and alternatives. All questions were addressed. Maximal Sterile Barrier Technique was utilized including caps, mask, sterile gowns, sterile gloves, sterile drape, hand hygiene and skin antiseptic. A timeout was performed prior to the initiation of the procedure. The pre-existing right nephrostomy tube was injected with contrast material under fluoroscopy. The catheter was cut and removed over a guidewire. A new 10 French nephrostomy tube was advanced over the wire and formed in the renal pelvis. Position was confirmed by fluoroscopy. The catheter was secured at the skin with a Prolene retention suture and StatLock device. It was connected to a new gravity drainage bag. IMPRESSION: Exchange of indwelling right 10 French percutaneous nephrostomy tube under fluoroscopy. Electronically Signed   By: Aletta Edouard M.D.   On: 09/10/2020 13:19     Medications:    . albuterol  2.5 mg Nebulization Once  . Chlorhexidine Gluconate Cloth  6 each Topical Q0600  . heparin  5,000 Units Subcutaneous Q8H  . mupirocin ointment  1 application Nasal BID  . sevelamer carbonate  2,400 mg Oral TID WC  . sodium chloride flush  3 mL Intravenous Q12H   acetaminophen **OR** acetaminophen, ondansetron **OR** ondansetron (ZOFRAN) IV, senna-docusate  Assessment/ Plan:  [name] is a 50 y.o.  male with ESRD on HD, HTN, stroke, right nephrostomy tube  who presents to [facility] on 09[facility]04[facility]20 for altered mental status. Patient was found to be hyperkalemic and hypoglycemic   [facility] [place]  Left AVF 49kg   1)Renal   ESRD Patient is on hemodialysis Was last dialyzed yesterday No need for renal placement therapy today   2)HTN  Blood pressure is stable    3)Anemia of chronic disease  CBC Latest Ref Rng & Units 09/10/2020 09/08/2020 09/07/2020  WBC 4.0 - 10.5 K/uL 5.6 5.9 7.1  Hemoglobin 13.0 - 17.0 g/dL 10.1(L) 11.0(L) 10.5(L)  Hematocrit 39.0 - 52.0 % 32.0(L) 35.7(L) 33.0(L)  Platelets 150 - 400 K/uL 108(L) 89(L) 78(L)       HGb at goal (9--11)   4) Secondary hyperparathyroidism -CKD Mineral-Bone Disorder    Lab Results  Component Value Date   CALCIUM 7.6 (L) 09/10/2020   CAION 0.76 (LL) 08/07/2019   PHOS 6.8 (H) 09/08/2020    Secondary Hyperparathyroidism present    Phosphorus is not  at goal. Patient is  on binders  5)Nephrolithiasis and neurogenic bladder Patient has history of chronic right nephrostomy drainage Patient had nephrostomy tube exchange done on April 21    6) Electrolytes   BMP Latest Ref Rng & Units 09/10/2020 09/08/2020 09/07/2020  Glucose 70 - 99 mg/dL 79 80 80  BUN 6 - 20 mg/dL 23(H) 51(H) 40(H)  Creatinine 0.61 - 1.24 mg/dL 3.06(H) 6.77(H) 5.45(H)  Sodium 135 - 145 mmol/L 138 140 139  Potassium 3.5 - 5.1 mmol/L 3.1(L) 4.3 3.9  Chloride 98 - 111 mmol/L 101 102 101  CO2 22 - 32 mmol/L '27 23 24  '$ Calcium 8.9 - 10.3 mg/dL 7.6(L) 7.9(L) 8.0(L)     Sodium Normonatremic   Potassium -Hypokalemic We will follow conservatively secondary to his history of ESRD   7)Acid base  Co2 at goal    Plan  No need for renal placement therapy today We will continue to follow    LOS: 7 Larry Morrison s [facility] 4/23/20222:58 PM

## 2020-09-11 NOTE — TOC Progression Note (Addendum)
Transition of Care [facility]) - Progression Note    Patient Details  Name: Larry Morrison MRN: [id number] Date of Birth: 02-04-1971  [facility] CM/SW Contact  Larry Price, RN Phone Number: 09/11/2020, 11:23 AM  Clinical Narrative:    Patient to be discharged to home with [facility] PT/OT/RN. Nephrostomy tube replaced this stay. Patient was recommended for SNF by PT/OT but adamantly refuses per progress notes. Psychiatric consult indicates patient does not have capacity to make decision but also kicked consulting provider out of his room.   CM spoke with mother, Larry Morrison, at [phone], and she indicated that she "knew her son and that we should not let him fool [place]" "He know what is is doing, do not let him fool you". She repeated this several times during the conversation.   She has been handling his medical affairs since his brain aneurysm years ago, but there is not legal guardian assigned or medical or legal PoA on record. Mother confirms this but that she has handled things and thinks Larry Morrison facility is too far away and that [facility] services would be best as "he is going to do what he wants to do".   She indicated that he lives with brother but she and a daughter/sister help clean and take come care.   She was unaware of the state EMS found patient. She said she had seen maggots in trash but not on patient, though he would not bath when encouraged to do so and she cleaned his apartment periodically. Discussed possibility of needing APS involvement and that they may reach out to her for information if consulted. She again repeated that he knows what he is doing, that he had just called her on the phone discussing discharge plans.   Conveyed this to provider, with concerns if APS needed to be involved, but provider feels patient understood his recommendation to discharge with [facility] services if he was refusing SNF. What mother stated confirms this. Will attempt to find [facility] agency to accept  Medicaid.   320 pm No HH agency has accepted. Mother is wanting him home saying he has an aid that helps per Unit RN. Larry Davies RN CM  4/24 Update note: Patient was transported home via Larry Morrison around 1030 pm 4/23. Unit RN reported brother was notified. Facesheet and Med. [employer]. Form and previously been printed up there around 5 pm 4/23 when discharge transportation issues were being discussed as mother could not then pick up patient and would not allow him to be taken to her house. Larry Davies RN CM (Amended 09/12/20 352 pm)       Expected Discharge Plan and Services     Discharge Planning Services: CM Consult   Living arrangements for the past 2 months: Apartment Expected Discharge Date: 09/11/20                                     Social Determinants of Health ([place]) Interventions    Readmission Risk Interventions Readmission Risk Prevention Plan 09/08/2020  Transportation Screening Complete  Social Work Consult for [place] Planning/Counseling Complete  Palliative Care Screening Complete  Medication Review Press photographer) Complete  Some recent data might be hidden

## 2020-09-11 NOTE — Progress Notes (Signed)
Patient refused AM heparin.  -nothing follows

## 2021-02-28 ENCOUNTER — Inpatient Hospital Stay
Admission: EM | Admit: 2021-02-28 | Discharge: 2021-03-03 | DRG: 252 | Disposition: A | Discharge status: Home / Self Care | Payer: Medicaid Other | Source: Ambulatory Visit | Attending: Hospitalist | Admitting: Hospitalist

## 2021-02-28 ENCOUNTER — Emergency Department: Payer: Medicaid Other

## 2021-02-28 ENCOUNTER — Other Ambulatory Visit: Payer: Self-pay

## 2021-02-28 DIAGNOSIS — J918 Pleural effusion in other conditions classified elsewhere: Secondary | ICD-10-CM | POA: Diagnosis present

## 2021-02-28 DIAGNOSIS — T8241XA Breakdown (mechanical) of vascular dialysis catheter, initial encounter: Secondary | ICD-10-CM | POA: Diagnosis present

## 2021-02-28 DIAGNOSIS — Z20822 Contact with and (suspected) exposure to covid-19: Secondary | ICD-10-CM | POA: Diagnosis present

## 2021-02-28 DIAGNOSIS — R0602 Shortness of breath: Secondary | ICD-10-CM | POA: Diagnosis not present

## 2021-02-28 DIAGNOSIS — I429 Cardiomyopathy, unspecified: Secondary | ICD-10-CM | POA: Diagnosis present

## 2021-02-28 DIAGNOSIS — Z5309 Procedure and treatment not carried out because of other contraindication: Secondary | ICD-10-CM | POA: Diagnosis not present

## 2021-02-28 DIAGNOSIS — N2581 Secondary hyperparathyroidism of renal origin: Secondary | ICD-10-CM | POA: Diagnosis present

## 2021-02-28 DIAGNOSIS — E43 Unspecified severe protein-calorie malnutrition: Secondary | ICD-10-CM | POA: Diagnosis present

## 2021-02-28 DIAGNOSIS — T82868A Thrombosis of vascular prosthetic devices, implants and grafts, initial encounter: Secondary | ICD-10-CM | POA: Diagnosis present

## 2021-02-28 DIAGNOSIS — I5023 Acute on chronic systolic (congestive) heart failure: Secondary | ICD-10-CM | POA: Diagnosis present

## 2021-02-28 DIAGNOSIS — T82510A Breakdown (mechanical) of surgically created arteriovenous fistula, initial encounter: Secondary | ICD-10-CM | POA: Diagnosis present

## 2021-02-28 DIAGNOSIS — E46 Unspecified protein-calorie malnutrition: Secondary | ICD-10-CM | POA: Insufficient documentation

## 2021-02-28 DIAGNOSIS — I69365 Other paralytic syndrome following cerebral infarction, bilateral: Secondary | ICD-10-CM

## 2021-02-28 DIAGNOSIS — E872 Acidosis, unspecified: Secondary | ICD-10-CM | POA: Diagnosis present

## 2021-02-28 DIAGNOSIS — N200 Calculus of kidney: Secondary | ICD-10-CM | POA: Diagnosis present

## 2021-02-28 DIAGNOSIS — Z993 Dependence on wheelchair: Secondary | ICD-10-CM

## 2021-02-28 DIAGNOSIS — I770 Arteriovenous fistula, acquired: Secondary | ICD-10-CM

## 2021-02-28 DIAGNOSIS — N189 Chronic kidney disease, unspecified: Secondary | ICD-10-CM | POA: Diagnosis not present

## 2021-02-28 DIAGNOSIS — R64 Cachexia: Secondary | ICD-10-CM | POA: Diagnosis present

## 2021-02-28 DIAGNOSIS — Z905 Acquired absence of kidney: Secondary | ICD-10-CM | POA: Diagnosis not present

## 2021-02-28 DIAGNOSIS — D631 Anemia in chronic kidney disease: Secondary | ICD-10-CM | POA: Diagnosis present

## 2021-02-28 DIAGNOSIS — Z7401 Bed confinement status: Secondary | ICD-10-CM

## 2021-02-28 DIAGNOSIS — Y712 Prosthetic and other implants, materials and accessory cardiovascular devices associated with adverse incidents: Secondary | ICD-10-CM | POA: Diagnosis present

## 2021-02-28 DIAGNOSIS — R778 Other specified abnormalities of plasma proteins: Secondary | ICD-10-CM

## 2021-02-28 DIAGNOSIS — J90 Pleural effusion, not elsewhere classified: Secondary | ICD-10-CM

## 2021-02-28 DIAGNOSIS — F1290 Cannabis use, unspecified, uncomplicated: Secondary | ICD-10-CM | POA: Diagnosis present

## 2021-02-28 DIAGNOSIS — T888XXA Other specified complications of surgical and medical care, not elsewhere classified, initial encounter: Secondary | ICD-10-CM | POA: Diagnosis not present

## 2021-02-28 DIAGNOSIS — Y838 Other surgical procedures as the cause of abnormal reaction of the patient, or of later complication, without mention of misadventure at the time of the procedure: Secondary | ICD-10-CM | POA: Diagnosis present

## 2021-02-28 DIAGNOSIS — Z936 Other artificial openings of urinary tract status: Secondary | ICD-10-CM

## 2021-02-28 DIAGNOSIS — Z9115 Patient's noncompliance with renal dialysis: Secondary | ICD-10-CM

## 2021-02-28 DIAGNOSIS — I871 Compression of vein: Secondary | ICD-10-CM | POA: Diagnosis present

## 2021-02-28 DIAGNOSIS — N186 End stage renal disease: Secondary | ICD-10-CM | POA: Diagnosis present

## 2021-02-28 DIAGNOSIS — I10 Essential (primary) hypertension: Secondary | ICD-10-CM | POA: Diagnosis not present

## 2021-02-28 DIAGNOSIS — Z992 Dependence on renal dialysis: Secondary | ICD-10-CM

## 2021-02-28 DIAGNOSIS — I248 Other forms of acute ischemic heart disease: Secondary | ICD-10-CM | POA: Diagnosis present

## 2021-02-28 DIAGNOSIS — T82898A Other specified complication of vascular prosthetic devices, implants and grafts, initial encounter: Secondary | ICD-10-CM

## 2021-02-28 DIAGNOSIS — I132 Hypertensive heart and chronic kidney disease with heart failure and with stage 5 chronic kidney disease, or end stage renal disease: Secondary | ICD-10-CM | POA: Diagnosis present

## 2021-02-28 DIAGNOSIS — Y832 Surgical operation with anastomosis, bypass or graft as the cause of abnormal reaction of the patient, or of later complication, without mention of misadventure at the time of the procedure: Secondary | ICD-10-CM | POA: Diagnosis not present

## 2021-02-28 DIAGNOSIS — Z881 Allergy status to other antibiotic agents status: Secondary | ICD-10-CM

## 2021-02-28 DIAGNOSIS — Z87891 Personal history of nicotine dependence: Secondary | ICD-10-CM

## 2021-02-28 DIAGNOSIS — E8770 Fluid overload, unspecified: Secondary | ICD-10-CM | POA: Diagnosis present

## 2021-02-28 DIAGNOSIS — N319 Neuromuscular dysfunction of bladder, unspecified: Secondary | ICD-10-CM | POA: Diagnosis present

## 2021-02-28 DIAGNOSIS — Z6820 Body mass index (BMI) 20.0-20.9, adult: Secondary | ICD-10-CM

## 2021-02-28 DIAGNOSIS — Z9889 Other specified postprocedural states: Secondary | ICD-10-CM

## 2021-02-28 HISTORY — DX: Pleural effusion, not elsewhere classified: J90

## 2021-02-28 LAB — CBC
HCT: 42.9 % (ref 39.0–52.0)
Hemoglobin: 14.2 g/dL (ref 13.0–17.0)
MCH: 32.6 pg (ref 26.0–34.0)
MCHC: 33.1 g/dL (ref 30.0–36.0)
MCV: 98.6 fL (ref 80.0–100.0)
Platelets: 144 10*3/uL — ABNORMAL LOW (ref 150–400)
RBC: 4.35 MIL/uL (ref 4.22–5.81)
RDW: 18.7 % — ABNORMAL HIGH (ref 11.5–15.5)
WBC: 6.1 10*3/uL (ref 4.0–10.5)
nRBC: 0 % (ref 0.0–0.2)

## 2021-02-28 LAB — BASIC METABOLIC PANEL
Anion gap: 19 — ABNORMAL HIGH (ref 5–15)
BUN: 63 mg/dL — ABNORMAL HIGH (ref 6–20)
CO2: 22 mmol/L (ref 22–32)
Calcium: 8.7 mg/dL — ABNORMAL LOW (ref 8.9–10.3)
Chloride: 100 mmol/L (ref 98–111)
Creatinine, Ser: 7.53 mg/dL — ABNORMAL HIGH (ref 0.61–1.24)
GFR, Estimated: 8 mL/min — ABNORMAL LOW (ref >60)
Glucose, Bld: 91 mg/dL (ref 70–99)
Potassium: 4.4 mmol/L (ref 3.5–5.1)
Sodium: 141 mmol/L (ref 135–145)

## 2021-02-28 LAB — RESP PANEL BY RT-PCR (FLU A&B, COVID) ARPGX2
Influenza A by PCR: NEGATIVE
Influenza B by PCR: NEGATIVE
SARS Coronavirus 2 by RT PCR: NEGATIVE

## 2021-02-28 LAB — HEPATIC FUNCTION PANEL
ALT: 16 U/L (ref 0–44)
AST: 24 U/L (ref 15–41)
Albumin: 3.1 g/dL — ABNORMAL LOW (ref 3.5–5.0)
Alkaline Phosphatase: 112 U/L (ref 38–126)
Bilirubin, Direct: 0.8 mg/dL — ABNORMAL HIGH (ref 0.0–0.2)
Indirect Bilirubin: 1.3 mg/dL — ABNORMAL HIGH (ref 0.3–0.9)
Total Bilirubin: 2.1 mg/dL — ABNORMAL HIGH (ref 0.3–1.2)
Total Protein: 8.7 g/dL — ABNORMAL HIGH (ref 6.5–8.1)

## 2021-02-28 LAB — TROPONIN I (HIGH SENSITIVITY): Troponin I (High Sensitivity): 155 ng/L (ref <18)

## 2021-02-28 LAB — BRAIN NATRIURETIC PEPTIDE: B Natriuretic Peptide: >4500 pg/mL — ABNORMAL HIGH (ref 0.0–100.0)

## 2021-02-28 MED ORDER — ALBUTEROL SULFATE HFA 108 (90 BASE) MCG/ACT IN AERS
2.0000 | INHALATION_SPRAY | RESPIRATORY_TRACT | Status: DC | PRN
  Filled 2021-02-28: qty 6.7

## 2021-02-28 MED ORDER — ASPIRIN 81 MG PO CHEW
324.0000 mg | CHEWABLE_TABLET | Freq: Once | ORAL | Status: AC
  Administered 2021-02-28: 324 mg via ORAL
  Filled 2021-02-28: qty 4

## 2021-02-28 MED ORDER — HYDRALAZINE HCL 20 MG/ML IJ SOLN
5.0000 mg | Freq: Four times a day (QID) | INTRAMUSCULAR | Status: DC | PRN
  Administered 2021-02-28: 5 mg via INTRAVENOUS
  Filled 2021-02-28: qty 1

## 2021-02-28 MED ORDER — LABETALOL HCL 5 MG/ML IV SOLN
5.0000 mg | Freq: Once | INTRAVENOUS | Status: AC
  Administered 2021-03-01: 5 mg via INTRAVENOUS
  Filled 2021-02-28: qty 4

## 2021-02-28 NOTE — ED Notes (Signed)
Spoke with [facility] and was informed pt's treatment was never started due to his shunt being clotted.

## 2021-02-28 NOTE — ED Notes (Addendum)
Pt Home Health Nurse, Santiago Glad, contact number [phone]

## 2021-02-28 NOTE — ED Notes (Signed)
Request make for transport to the floor

## 2021-02-28 NOTE — H&P (Signed)
History and Physical    Larry Borth [id number] DOB: [birth year] DOA: 02/28/2021  PCP: [facility] [place]  Patient coming from: dialysis  I have personally briefly reviewed patient's old medical records in [place]  Chief Complaint: Unable to access right AV fistula  HPI: Larry Morrison is a 50 Morrison.o. 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  Patient denies any other symptoms.  States he is otherwise in his normal state of health and is just here due to lack of AV fistula access.  Last dialysis on 10/7.  Unable to provide much other history per denies any shortness of breath or chest pain.  Notes chronic cough.  ED Course: He was afebrile, mildly hypertensive systolic 123456 with heart rate of 93 and stable on room air.  No leukocytosis or anemia.  Platelet of 144.  Sodium of 141, potassium 4.4, creatinine of 7.53 with anion gap of 19.  BNP of greater than 4500, troponin of 55.  Chest x-ray shows left moderate to large pleural effusion with cardiomegaly.  Right AV fistula ultrasound shows AV fistula thrombus.  He was given 325 mg of aspirin.  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68   Dialysis patient [facility])    Dyspnea    Foot drop    History of kidney stones    History of nephrostomy    Hypertension    Renal disorder    Stroke [facility])     Past Surgical History:  Procedure Laterality Date   AV FISTULA PLACEMENT Right 08/07/2019   Procedure: ARTERIOVENOUS (AV) FISTULA CREATION;  Surgeon: Algernon Huxley, MD;  Location: [facility] ORS;  Service:  Vascular;  Laterality: Right;   AV FISTULA PLACEMENT Right 11/20/2019   Procedure: INSERTION OF ARTERIOVENOUS (AV) GORE-TEX GRAFT ARM;  Surgeon: Algernon Huxley, MD;  Location: [facility] ORS;  Service: Vascular;  Laterality: Right;   IR NEPHROSTOMY EXCHANGE RIGHT  09/10/2020   NEPHRECTOMY Left    NEPHRECTOMY     ORCHIECTOMY Right 10/27/2016   Procedure: PSB ORCHIECTOMY;  Surgeon: Hollice Espy, MD;  Location: [facility] ORS;  Service: Urology;  Laterality: Right;   ORCHIOPEXY Bilateral 10/27/2016   Procedure: ORCHIOPEXY ADULT;  Surgeon: Hollice Espy, MD;  Location: [facility] ORS;  Service: Urology;  Laterality: Bilateral;   SCROTAL EXPLORATION Bilateral 10/27/2016   Procedure: SCROTUM EXPLORATION;  Surgeon: Hollice Espy, MD;  Location: [facility] ORS;  Service: Urology;  Laterality: Bilateral;     reports that he has quit smoking. His smoking use included cigarettes. He smoked an average of .5 packs per day. He has never used smokeless tobacco. He reports current drug use. Drug: Marijuana. He reports that he does not drink alcohol. Social History  Allergies  Allergen Reactions   Vancomycin     Patient denies - repeated denial to pharm tech 09-05-2020    Family History  Family history unknown: Yes     Prior to Admission medications   Not on File    Physical Exam: Vitals:   02/28/21 1410 02/28/21 1411 02/28/21 1733  BP: (!) 168/123  (!) 153/113  Pulse: 96  93  Resp: 20  18  Temp: 98.7 F (37.1 C)    TempSrc: Oral    SpO2: 98%  98%  Weight:  61.2 kg   Height:  '5\' 4"'$  (1.626 m)     Constitutional: NAD, calm, comfortable, thin cachetic male laying at approximately 40 incline.  Vitals:   02/28/21 1410 02/28/21 1411 02/28/21 1733  BP: (!) 168/123  (!) 153/113  Pulse: 96  93  Resp: 20  18  Temp: 98.7 F (37.1 C)    TempSrc: Oral    SpO2: 98%  98%  Weight:  61.2 kg   Height:  '5\' 4"'$  (1.626 m)    Eyes: PERRL, lids and conjunctivae normal ENMT: Mucous membranes are moist.  Neck: normal,  supple Respiratory: Decreased aeration throughout but no wheezing, no crackles. Normal respiratory effort. No accessory muscle use.  Cardiovascular: Regular rate and rhythm, no murmurs / rubs / gallops. No extremity edema.  No palpable thrills to the right AV fistula. Abdomen: no tenderness, no masses palpated. . Bowel sounds positive.  Back: right nephrostomy tube in place Musculoskeletal: no clubbing / cyanosis. No joint deformity upper and lower extremities.  Contractures of all 4 extremities. Skin: no rashes, lesions, ulcers. No induration Neurologic: CN 2-12 grossly intact. Sensation intact,  Strength 3/5 in lower extremity.  Psychiatric: Alert and oriented x 3. Normal mood.     Labs on Admission: I have personally reviewed following labs and imaging studies  CBC: Recent Labs  Lab 02/28/21 1456  WBC 6.1  HGB 14.2  HCT 42.9  MCV 98.6  PLT 123456*   Basic Metabolic Panel: Recent Labs  Lab 02/28/21 1456  NA 141  K 4.4  CL 100  CO2 22  GLUCOSE 91  BUN 63*  CREATININE 7.53*  CALCIUM 8.7*   GFR: Estimated Creatinine Clearance: 9.6 mL/min (A) (by C-G formula based on SCr of 7.53 mg/dL (H)). Liver Function Tests: Recent Labs  Lab 02/28/21 1456  AST 24  ALT 16  ALKPHOS 112  BILITOT 2.1*  PROT 8.7*  ALBUMIN 3.1*   No results for input(s): LIPASE, AMYLASE in the last 168 hours. No results for input(s): AMMONIA in the last 168 hours. Coagulation Profile: No results for input(s): INR, PROTIME in the last 168 hours. Cardiac Enzymes: No results for input(s): CKTOTAL, CKMB, CKMBINDEX, TROPONINI in the last 168 hours. BNP (last 3 results) No results for input(s): PROBNP in the last 8760 hours. HbA1C: No results for input(s): HGBA1C in the last 72 hours. CBG: No results for input(s): GLUCAP in the last 168 hours. Lipid Profile: No results for input(s): CHOL, HDL, LDLCALC, TRIG, CHOLHDL, LDLDIRECT in the last 72 hours. Thyroid Function Tests: No results for input(s):  TSH, T4TOTAL, FREET4, T3FREE, THYROIDAB in the last 72 hours. Anemia Panel: No results for input(s): VITAMINB12, FOLATE, FERRITIN, TIBC, IRON, RETICCTPCT in the last 72 hours. Urine analysis:    Component Value Date/Time   COLORURINE YELLOW (A) 09/04/2020 1937   APPEARANCEUR CLOUDY (A) 09/04/2020 1937   APPEARANCEUR Turbid 09/19/2014 1914   LABSPEC 1.015 09/04/2020 1937   LABSPEC 1.011 09/19/2014 1914   PHURINE 8.0 09/04/2020 1937   GLUCOSEU 150 (A) 09/04/2020 1937   GLUCOSEU Negative 09/19/2014 1914   HGBUR MODERATE (A) 09/04/2020 [place] NEGATIVE 09/04/2020 1937   BILIRUBINUR Negative 09/19/2014 1914   KETONESUR NEGATIVE 09/04/2020 1937   PROTEINUR >=300 (A) 09/04/2020 1937   NITRITE NEGATIVE 09/04/2020 1937   LEUKOCYTESUR LARGE (A) 09/04/2020 1937   LEUKOCYTESUR 2+ 09/19/2014  1914    Radiological Exams on Admission: DG Chest 2 View  Result Date: 02/28/2021 CLINICAL DATA:  Shortness of breath. EXAM: CHEST - 2 VIEW COMPARISON:  September 04, 2020. FINDINGS: Increased, now moderate to large left pleural effusion. Overlying left-sided opacities. Right lung is clear. No right-sided pleural effusion. No visible pneumothorax. Partially imaged right upper quadrant drain. Right axillary stent. Cardiac silhouette is partly obscured with visible portion similar to prior. Similar mild cardiomegaly. IMPRESSION: 1. Increased, now moderate to large left pleural effusion. 2. Overlying left-sided opacities, most likely compressive atelectasis. Pneumonia or aspiration is not excluded. 3. Cardiomegaly. Electronically Signed   By: Margaretha Sheffield M.D.   On: 02/28/2021 14:23   [place] UPPER EXTREMITY ARTERIAL RIGHT LIMITED (GRAFT, SINGLE VESSEL)  Result Date: 02/28/2021 CLINICAL DATA:  Evaluate AV fistula. EXAM: Right UPPER EXTREMITY ARTERIAL DUPLEX SCAN TECHNIQUE: Gray-scale sonography as well as color Doppler and duplex ultrasound was performed to evaluate the arteries of the upper extremity.  COMPARISON:  None. FINDINGS: Ultrasound examination of the right brachial basilic arteriovenous fistula was performed with grayscale, color-flow, and spectral Doppler images. No flow is demonstrated within the AV fistula itself. Heterogeneous echogenic thrombus is suggested. Flow is demonstrated in the brachial artery with velocity measured at 28 centimeters/second. Arterial flow is demonstrated at the distal elbow with velocity measured at 12 centimeters/second. IMPRESSION: No flow is demonstrated in the arteriovenous fistula in the right upper extremity. Electronically Signed   By: Lucienne Capers M.D.   On: 02/28/2021 19:45      Assessment/Plan  Right AV fistula thrombus -Has been given aspirin '325mg'$  in the ED - vascular surgery Dr. Delana Meyer has been consulted and will likely take for intervention in the morning. Keep NPO   Metabolic acidosis - Gap of 19 secondary to missed dialysis with worsening renal insufficiency - anticipate correct with dialysis. Unable to give IV fluids as he is already significantly fluid overloaded with large pleural effusion  Large left-sided pleural effusion - Likely secondary to fluid overload, missed dialysis, suspected cardiomyopathy with BNP >4500 and elevated troponin of 155. Obtain echo - Thoracentesis ultrasound with fluid study ordered  ESRD on HD MWF -Needs nephrology consult for dialysis once there is access. Unable to receive dialysis today. Last HD on 10/7 -Creatinine of 7.53 with unclear baseline.  Potassium 4.4 no emergent need for HD at this time -check phosphorus   Anemia with chronic kidney disease - Hemoglobin stable 14.2  Nephrolithiasis, neurogenic bladder - Chronic right nephrostomy drain - Last exchange on 6/28 with IR at [facility]. Needs to follow up as he is due for a change every 3 months   HTN -elevated, suspect will improve with dialysis - placed on PRN IV hydralazine   Hx of CVA -wheelchair bound   Level of care:  Med-Surg  Status is: Inpatient  Remains inpatient appropriate because:Inpatient level of care appropriate due to severity of illness  Dispo: The patient is from: Home              Anticipated d/c is to: Home              Patient currently is not medically stable to d/c.   Difficult to place patient No         Orene Desanctis DO [facility]   If 7PM-7AM, please contact night-coverage [email or web address]   02/28/2021, 8:32 PM

## 2021-02-28 NOTE — Plan of Care (Signed)
  Problem: Education: Goal: Knowledge of General Education information will improve Description: Including pain rating scale, medication(s)/side effects and non-pharmacologic comfort measures Outcome: Progressing   Problem: Activity: Goal: Risk for activity intolerance will decrease Outcome: Progressing   Problem: Nutrition: Goal: Adequate nutrition will be maintained Outcome: Progressing   Problem: Coping: Goal: Level of anxiety will decrease Outcome: Progressing   

## 2021-02-28 NOTE — ED Provider Notes (Signed)
[facility] Emergency Department Provider Note  ____________________________________________   Event Date/Time   First MD Initiated Contact with Patient 02/28/21 1819     (approximate)  I have reviewed the triage vital signs and the nursing notes.   HISTORY  Chief Complaint Shortness of Breath   HPI Larry Morrison is a 50 y.o. male with history of CVA, ESRD on hemodialysis, chronic right nephrostomy drain, nephrolithiasis, neurogenic bladder and wheelchair-bound at baseline secondary to CVA who presents for assessment via EMS from dialysis after he was unable to get dialysis performed today due to a clotted right upper extremity AV shunt.  Patient states he was last dialyzed on 10/7.  States he has been feeling little more short of breath today.  He denies any new chest pain abdominal pain, back pain, headache, earache, sore throat or acute weakness.  Denies any vomiting, diarrhea or change in p.o. output.  Denies any other acute concerns at this time.  He is not on any blood thinners.         Past Medical History:  Diagnosis Date   Anemia    Aneurysm ([place])    brain at age 56   Dialysis patient [facility])    Dyspnea    Foot drop    History of kidney stones    History of nephrostomy    Hypertension    Renal disorder    Stroke [facility])     Patient Active Problem List   Diagnosis Date Noted   History of traumatic brain injury 09/07/2020   Cognitive impairment 99991111   Metabolic encephalopathy 0000000   ESRD needing dialysis ([place]) 09/04/2020   Hypoglycemia 09/04/2020   Uremia 09/04/2020   COVID-19 virus infection 06/18/2020   Stroke ([place]) 06/18/2020   Thrombocytopenia ([place]) 06/18/2020   Elevated troponin 06/18/2020   Hyperkalemia 06/13/2020   Anemia in ESRD (end-stage renal disease) ([place]) 09/17/2019   Essential hypertension 07/15/2019   Testicular torsion    Pyuria 06/10/2016   Chronic pain following surgery or procedure 12/01/2015    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[place]) 12/01/2015   Tobacco use disorder 12/01/2015   ESRD (end stage renal disease) ([place]) 04/28/2014   Obstructed nephrostomy tube ([place]) 10/23/2013   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   Neurogenic bladder 02/09/1998   Urinary calculus 02/09/1998   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    Past Surgical History:  Procedure Laterality Date   AV FISTULA PLACEMENT Right 08/07/2019   Procedure: ARTERIOVENOUS (AV) FISTULA CREATION;  Surgeon: Algernon Huxley, MD;  Location: [facility] ORS;  Service: Vascular;  Laterality: Right;   AV FISTULA PLACEMENT Right 11/20/2019   Procedure: INSERTION OF ARTERIOVENOUS (AV) GORE-TEX GRAFT ARM;  Surgeon: Algernon Huxley, MD;  Location: [facility] ORS;  Service: Vascular;  Laterality: Right;   IR NEPHROSTOMY EXCHANGE RIGHT  09/10/2020   NEPHRECTOMY Left    NEPHRECTOMY     ORCHIECTOMY Right 10/27/2016   Procedure: PSB ORCHIECTOMY;  Surgeon: Hollice Espy, MD;  Location: [facility] ORS;  Service: Urology;  Laterality: Right;   ORCHIOPEXY Bilateral 10/27/2016   Procedure: ORCHIOPEXY ADULT;  Surgeon: Hollice Espy, MD;  Location: [facility] ORS;  Service: Urology;  Laterality: Bilateral;   SCROTAL EXPLORATION Bilateral 10/27/2016   Procedure: SCROTUM EXPLORATION;  Surgeon: Hollice Espy, MD;  Location: [facility] ORS;  Service: Urology;  Laterality: Bilateral;    Prior to Admission medications   Not on File    Allergies Vancomycin  Family History  Family history unknown: Yes  Social History Social History   Tobacco Use   Smoking status: Former    Packs/day: 0.50    Types: Cigarettes   Smokeless tobacco: Never  Vaping Use   Vaping Use: Never used  Substance Use Topics   Alcohol use: No   Drug use: Yes    Types: Marijuana    Comment: today    Review of Systems  Review of Systems  Constitutional:  Negative for chills and fever.  HENT:  Negative for sore throat.   Eyes:  Negative for pain.  Respiratory:   Positive for shortness of breath. Negative for cough and stridor.   Cardiovascular:  Negative for chest pain.  Gastrointestinal:  Negative for vomiting.  Genitourinary:  Negative for dysuria.  Musculoskeletal:  Negative for myalgias.  Skin:  Negative for rash.  Neurological:  Positive for weakness (chronic). Negative for seizures, loss of consciousness and headaches.  Psychiatric/Behavioral:  Negative for suicidal ideas.   All other systems reviewed and are negative.    ____________________________________________   PHYSICAL EXAM:  VITAL SIGNS: ED Triage Vitals  Enc Vitals Group     BP 02/28/21 1410 (!) 168/123     Pulse Rate 02/28/21 1410 96     Resp 02/28/21 1410 20     Temp 02/28/21 1410 98.7 F (37.1 C)     Temp Source 02/28/21 1410 Oral     SpO2 02/28/21 1410 98 %     Weight 02/28/21 1411 135 lb (61.2 kg)     Height 02/28/21 1411 '5\' 4"'$  (1.626 m)     Head Circumference --      Peak Flow --      Pain Score 02/28/21 1304 0     Pain Loc --      Pain Edu? --      Excl. in [place]? --    Vitals:   02/28/21 1410 02/28/21 1733  BP: (!) 168/123 (!) 153/113  Pulse: 96 93  Resp: 20 18  Temp: 98.7 F (37.1 C)   SpO2: 98% 98%   Physical Exam Vitals and nursing note reviewed.  Constitutional:      Appearance: He is well-developed.  HENT:     Head: Normocephalic and atraumatic.  Eyes:     Conjunctiva/sclera: Conjunctivae normal.  Cardiovascular:     Rate and Rhythm: Normal rate and regular rhythm.     Heart sounds: No murmur heard. Pulmonary:     Effort: Pulmonary effort is normal. No respiratory distress.     Breath sounds: Examination of the right-lower field reveals rales. Examination of the left-lower field reveals rales. Rales present.  Abdominal:     Palpations: Abdomen is soft.     Tenderness: There is no abdominal tenderness.  Musculoskeletal:     Cervical back: Neck supple.  Skin:    General: Skin is warm and dry.  Neurological:     Mental Status: He is  alert.    Nephrostomy tube is in place in the right flank.  I cannot palpate or auscultate a thrill of the right AV fistula site.  No significant surrounding induration streaking erythema or other significant overlying skin changes. ____________________________________________   LABS (all labs ordered are listed, but only abnormal results are displayed)  Labs Reviewed  CBC - Abnormal; Notable for the following components:      Result Value   RDW 18.7 (*)    Platelets 144 (*)    All other components within normal limits  BASIC METABOLIC PANEL - Abnormal; Notable for  the following components:   BUN 63 (*)    Creatinine, Ser 7.53 (*)    Calcium 8.7 (*)    GFR, Estimated 8 (*)    Anion gap 19 (*)    All other components within normal limits  BRAIN NATRIURETIC PEPTIDE - Abnormal; Notable for the following components:   B Natriuretic Peptide >4,500.0 (*)    All other components within normal limits  HEPATIC FUNCTION PANEL - Abnormal; Notable for the following components:   Total Protein 8.7 (*)    Albumin 3.1 (*)    Total Bilirubin 2.1 (*)    Bilirubin, Direct 0.8 (*)    Indirect Bilirubin 1.3 (*)    All other components within normal limits  TROPONIN I (HIGH SENSITIVITY) - Abnormal; Notable for the following components:   Troponin I (High Sensitivity) 155 (*)    All other components within normal limits  RESP PANEL BY RT-PCR (FLU A&B, COVID) [id number]   ____________________________________________  EKG  ECG shows sinus rhythm with a ventricular rate of 98, normal axis, prolonged QTc interval at 546 and fairly diffuse ST changes versus artifact. ____________________________________________  RADIOLOGY  ED MD interpretation: Chest x-ray shows cardiomegaly and a fairly large left pleural effusion.  No pneumothorax, right-sided symptoms or other clear acute process.  Official radiology report(s): DG Chest 2 View  Result Date: 02/28/2021 CLINICAL DATA:  Shortness of breath. EXAM:  CHEST - 2 VIEW COMPARISON:  September 04, 2020. FINDINGS: Increased, now moderate to large left pleural effusion. Overlying left-sided opacities. Right lung is clear. No right-sided pleural effusion. No visible pneumothorax. Partially imaged right upper quadrant drain. Right axillary stent. Cardiac silhouette is partly obscured with visible portion similar to prior. Similar mild cardiomegaly. IMPRESSION: 1. Increased, now moderate to large left pleural effusion. 2. Overlying left-sided opacities, most likely compressive atelectasis. Pneumonia or aspiration is not excluded. 3. Cardiomegaly. Electronically Signed   By: Margaretha Sheffield M.D.   On: 02/28/2021 14:23   [place] UPPER EXTREMITY ARTERIAL RIGHT LIMITED (GRAFT, SINGLE VESSEL)  Result Date: 02/28/2021 CLINICAL DATA:  Evaluate AV fistula. EXAM: Right UPPER EXTREMITY ARTERIAL DUPLEX SCAN TECHNIQUE: Gray-scale sonography as well as color Doppler and duplex ultrasound was performed to evaluate the arteries of the upper extremity. COMPARISON:  None. FINDINGS: Ultrasound examination of the right brachial basilic arteriovenous fistula was performed with grayscale, color-flow, and spectral Doppler images. No flow is demonstrated within the AV fistula itself. Heterogeneous echogenic thrombus is suggested. Flow is demonstrated in the brachial artery with velocity measured at 28 centimeters/second. Arterial flow is demonstrated at the distal elbow with velocity measured at 12 centimeters/second. IMPRESSION: No flow is demonstrated in the arteriovenous fistula in the right upper extremity. Electronically Signed   By: Lucienne Capers M.D.   On: 02/28/2021 19:45    ____________________________________________   PROCEDURES  Procedure(s) performed (including Critical Care):  Procedures   ____________________________________________   INITIAL IMPRESSION / ASSESSMENT AND PLAN / ED COURSE      Patient presents with above-stated history exam for assessment of  shortness of breath over the last 1 to 2 days in the setting of and unable to get dialysis today due to clot boarded in his AV fistula in the right upper extremity.  He is denying any other acute sick symptoms.  On arrival he is hypertensive with otherwise stable vital signs on room air.  Differential includes volume overload from missed dialysis today, pneumonia, pneumothorax, effusion, CHF exacerbation, anemia, arrhythmia and ACS.  There is no hypoxia, tachypnea or  tachycardia or complaints of chest pain to suggest PE at this time.  ECG with multiple nonspecific findings described above.  Troponin elevated 155.  Chest x-ray shows cardiomegaly and a fairly large left pleural effusion.  No pneumothorax, right-sided symptoms or other clear acute process.  BMP remarkable for BUN and creatinine consistent with history of ESRD and missed dialysis today with no dialysis in a couple days.  Potassium is unremarkable and bicarb is 22.  CBC shows no leukocytosis or acute anemia.  BNP is greater than 4500 likely frequent component of volume overload contributing shortness of breath.  Suspect this is likely multifactorial as he likely has some compressive atelectasis from effusion seen on x-ray.  Ultrasound of the right upper extremity AV fistula shows no flow concerning for occlusion.  I did reach out to on-call vascular surgeon Dr. Delana Meyer who recommended stress XR.  I will admit to medicine service for further evaluation and management with concerns for volume overload causing elevated troponin likely multifactorial with patient currently having nonfunctional AV fistula and likely needing dialysis urgently within the next 12 to 24 hours.  He is denying any chest pain and so will defer heparin and give a dose of ASA pending repeat troponin which again I suspect is likely from demand ischemia and CKD.     ____________________________________________   FINAL CLINICAL IMPRESSION(S) / ED DIAGNOSES  Final  diagnoses:  Pleural effusion  Troponin I above reference range  ESRD (end stage renal disease) (HCC)    Medications  aspirin chewable tablet 324 mg (has no administration in time range)     ED Discharge Orders     None        Note:  This document was prepared using Dragon voice recognition software and may include unintentional dictation errors.    Lucrezia Starch, MD 02/28/21 [phone]

## 2021-02-28 NOTE — ED Triage Notes (Signed)
Pt comes with c/o SOB and fluid overload. Pt brought via EMS from [facility]. Pt unable to complete treatment due to shunt clotting off.  BP-170/113 91-95% RA HR-93

## 2021-03-01 ENCOUNTER — Inpatient Hospital Stay: Payer: Medicaid Other

## 2021-03-01 ENCOUNTER — Inpatient Hospital Stay (HOSPITAL_COMMUNITY)
Admit: 2021-03-01 | Discharge: 2021-03-01 | Disposition: A | Discharge status: Home / Self Care | Payer: Medicaid Other | Attending: Family Medicine | Admitting: Family Medicine

## 2021-03-01 ENCOUNTER — Encounter
Admission: EM | Disposition: A | Discharge status: Home / Self Care | Payer: Self-pay | Source: Ambulatory Visit | Attending: Hospitalist

## 2021-03-01 ENCOUNTER — Other Ambulatory Visit (INDEPENDENT_AMBULATORY_CARE_PROVIDER_SITE_OTHER): Payer: Self-pay | Admitting: Vascular Surgery

## 2021-03-01 ENCOUNTER — Inpatient Hospital Stay: Admit: 2021-03-01 | Payer: Medicaid Other

## 2021-03-01 DIAGNOSIS — Y832 Surgical operation with anastomosis, bypass or graft as the cause of abnormal reaction of the patient, or of later complication, without mention of misadventure at the time of the procedure: Secondary | ICD-10-CM

## 2021-03-01 DIAGNOSIS — T82898A Other specified complication of vascular prosthetic devices, implants and grafts, initial encounter: Secondary | ICD-10-CM

## 2021-03-01 DIAGNOSIS — R0602 Shortness of breath: Secondary | ICD-10-CM | POA: Diagnosis not present

## 2021-03-01 DIAGNOSIS — R778 Other specified abnormalities of plasma proteins: Secondary | ICD-10-CM | POA: Diagnosis not present

## 2021-03-01 DIAGNOSIS — I871 Compression of vein: Secondary | ICD-10-CM

## 2021-03-01 DIAGNOSIS — N186 End stage renal disease: Secondary | ICD-10-CM

## 2021-03-01 DIAGNOSIS — Z992 Dependence on renal dialysis: Secondary | ICD-10-CM

## 2021-03-01 DIAGNOSIS — E46 Unspecified protein-calorie malnutrition: Secondary | ICD-10-CM | POA: Insufficient documentation

## 2021-03-01 DIAGNOSIS — E43 Unspecified severe protein-calorie malnutrition: Secondary | ICD-10-CM | POA: Insufficient documentation

## 2021-03-01 DIAGNOSIS — T82868A Thrombosis of vascular prosthetic devices, implants and grafts, initial encounter: Secondary | ICD-10-CM

## 2021-03-01 HISTORY — PX: A/V SHUNT INTERVENTION: CATH118220

## 2021-03-01 LAB — ECHOCARDIOGRAM COMPLETE
AR max vel: 2.91 cm2
AV Area VTI: 2.45 cm2
AV Area mean vel: 2.54 cm2
AV Mean grad: 2 mmHg
AV Peak grad: 3.6 mmHg
Ao pk vel: 0.95 m/s
Area-P 1/2: 5.66 cm2
Height: 64 in
MV VTI: 2.22 cm2
S' Lateral: 4.72 cm
Weight: 2160 oz

## 2021-03-01 LAB — GLUCOSE, PLEURAL OR PERITONEAL FLUID: Glucose, Fluid: 84 mg/dL

## 2021-03-01 LAB — BASIC METABOLIC PANEL
Anion gap: 19 — ABNORMAL HIGH (ref 5–15)
BUN: 72 mg/dL — ABNORMAL HIGH (ref 6–20)
CO2: 23 mmol/L (ref 22–32)
Calcium: 8.6 mg/dL — ABNORMAL LOW (ref 8.9–10.3)
Chloride: 100 mmol/L (ref 98–111)
Creatinine, Ser: 8.06 mg/dL — ABNORMAL HIGH (ref 0.61–1.24)
GFR, Estimated: 7 mL/min — ABNORMAL LOW (ref >60)
Glucose, Bld: 77 mg/dL (ref 70–99)
Potassium: 4.6 mmol/L (ref 3.5–5.1)
Sodium: 142 mmol/L (ref 135–145)

## 2021-03-01 LAB — PROTEIN, PLEURAL OR PERITONEAL FLUID: Total protein, fluid: <3 g/dL

## 2021-03-01 LAB — BODY FLUID CELL COUNT WITH DIFFERENTIAL
Eos, Fluid: 0 %
Lymphs, Fluid: 44 %
Monocyte-Macrophage-Serous Fluid: 50 %
Neutrophil Count, Fluid: 6 %
Total Nucleated Cell Count, Fluid: 65 cu mm

## 2021-03-01 LAB — ALBUMIN, PLEURAL OR PERITONEAL FLUID: Albumin, Fluid: <1.5 g/dL

## 2021-03-01 LAB — LACTATE DEHYDROGENASE, PLEURAL OR PERITONEAL FLUID: LD, Fluid: 78 U/L — ABNORMAL HIGH (ref 3–23)

## 2021-03-01 LAB — PHOSPHORUS: Phosphorus: 9.8 mg/dL — ABNORMAL HIGH (ref 2.5–4.6)

## 2021-03-01 SURGERY — A/V SHUNT INTERVENTION
Anesthesia: Moderate Sedation

## 2021-03-01 MED ORDER — HEPARIN SODIUM (PORCINE) 1000 UNIT/ML IJ SOLN
INTRAMUSCULAR | Status: AC
  Filled 2021-03-01: qty 1

## 2021-03-01 MED ORDER — HEPARIN SODIUM (PORCINE) 1000 UNIT/ML DIALYSIS: 1000.0000 [IU] | INTRAMUSCULAR | Status: DC | PRN

## 2021-03-01 MED ORDER — METHYLPREDNISOLONE SODIUM SUCC 125 MG IJ SOLR: 125.0000 mg | Freq: Once | INTRAMUSCULAR | Status: DC | PRN

## 2021-03-01 MED ORDER — SODIUM CHLORIDE 0.9 % IV SOLN: INTRAVENOUS | Status: DC

## 2021-03-01 MED ORDER — MIDAZOLAM HCL 2 MG/ML PO SYRP: 8.0000 mg | ORAL_SOLUTION | Freq: Once | ORAL | Status: DC | PRN

## 2021-03-01 MED ORDER — LIDOCAINE-PRILOCAINE 2.5-2.5 % EX CREA: 1.0000 "application " | TOPICAL_CREAM | CUTANEOUS | Status: DC | PRN

## 2021-03-01 MED ORDER — HEPARIN SODIUM (PORCINE) 1000 UNIT/ML IJ SOLN
INTRAMUSCULAR | Status: DC | PRN
  Administered 2021-03-01: 4000 [IU] via INTRAVENOUS

## 2021-03-01 MED ORDER — ALTEPLASE 2 MG IJ SOLR
INTRAMUSCULAR | Status: DC | PRN
  Administered 2021-03-01: 12 mg

## 2021-03-01 MED ORDER — FENTANYL CITRATE PF 50 MCG/ML IJ SOSY
PREFILLED_SYRINGE | INTRAMUSCULAR | Status: AC
  Filled 2021-03-01: qty 1

## 2021-03-01 MED ORDER — SODIUM CHLORIDE 0.9 % IV SOLN: 100.0000 mL | INTRAVENOUS | Status: DC | PRN

## 2021-03-01 MED ORDER — CEFAZOLIN SODIUM-DEXTROSE 1-4 GM/50ML-% IV SOLN: 1.0000 g | Freq: Once | INTRAVENOUS | Status: DC

## 2021-03-01 MED ORDER — MIDAZOLAM HCL 5 MG/5ML IJ SOLN
INTRAMUSCULAR | Status: AC
  Filled 2021-03-01: qty 5

## 2021-03-01 MED ORDER — ONDANSETRON HCL 4 MG/2ML IJ SOLN: 4.0000 mg | Freq: Four times a day (QID) | INTRAMUSCULAR | Status: DC | PRN

## 2021-03-01 MED ORDER — DIPHENHYDRAMINE HCL 50 MG/ML IJ SOLN: 50.0000 mg | Freq: Once | INTRAMUSCULAR | Status: DC | PRN

## 2021-03-01 MED ORDER — LIDOCAINE HCL (PF) 1 % IJ SOLN: 5.0000 mL | INTRAMUSCULAR | Status: DC | PRN

## 2021-03-01 MED ORDER — FAMOTIDINE 20 MG PO TABS: 40.0000 mg | ORAL_TABLET | Freq: Once | ORAL | Status: DC | PRN

## 2021-03-01 MED ORDER — ALTEPLASE 2 MG IJ SOLR: 2.0000 mg | Freq: Once | INTRAMUSCULAR | Status: DC | PRN

## 2021-03-01 MED ORDER — HYDROMORPHONE HCL 1 MG/ML IJ SOLN
1.0000 mg | Freq: Once | INTRAMUSCULAR | Status: AC | PRN
Start: 2021-03-01 — End: 2021-03-02
  Administered 2021-03-02: 1 mg via INTRAVENOUS
  Filled 2021-03-01: qty 1

## 2021-03-01 MED ORDER — CHLORHEXIDINE GLUCONATE CLOTH 2 % EX PADS
6.0000 | MEDICATED_PAD | Freq: Every day | CUTANEOUS | Status: DC
  Administered 2021-03-02 – 2021-03-03 (×2): 6 via TOPICAL

## 2021-03-01 MED ORDER — FENTANYL CITRATE (PF) 100 MCG/2ML IJ SOLN
INTRAMUSCULAR | Status: DC | PRN
  Administered 2021-03-01: 50 ug via INTRAVENOUS
  Administered 2021-03-01 (×2): 25 ug via INTRAVENOUS

## 2021-03-01 MED ORDER — CEFAZOLIN SODIUM-DEXTROSE 1-4 GM/50ML-% IV SOLN
INTRAVENOUS | Status: AC
  Filled 2021-03-01: qty 50

## 2021-03-01 MED ORDER — PROSOURCE PLUS PO LIQD
30.0000 mL | Freq: Two times a day (BID) | ORAL | Status: DC
  Administered 2021-03-02 (×2): 30 mL via ORAL
  Filled 2021-03-01 (×4): qty 30

## 2021-03-01 MED ORDER — ENSURE ENLIVE PO LIQD
237.0000 mL | ORAL | Status: DC
  Administered 2021-03-02: 237 mL via ORAL

## 2021-03-01 MED ORDER — PENTAFLUOROPROP-TETRAFLUOROETH EX AERO: 1.0000 "application " | INHALATION_SPRAY | CUTANEOUS | Status: DC | PRN

## 2021-03-01 MED ORDER — MIDAZOLAM HCL 2 MG/2ML IJ SOLN
INTRAMUSCULAR | Status: AC
  Filled 2021-03-01: qty 2

## 2021-03-01 MED ORDER — MIDAZOLAM HCL 2 MG/2ML IJ SOLN
INTRAMUSCULAR | Status: DC | PRN
  Administered 2021-03-01: 2 mg via INTRAVENOUS
  Administered 2021-03-01 (×2): 1 mg via INTRAVENOUS

## 2021-03-01 MED ORDER — RENA-VITE PO TABS
1.0000 | ORAL_TABLET | Freq: Every day | ORAL | Status: DC
  Administered 2021-03-02: 1 via ORAL
  Filled 2021-03-01: qty 1

## 2021-03-01 SURGICAL SUPPLY — 26 items
BALLN DORADO 8X60X80 (BALLOONS) ×2
BALLOON DORADO 8X60X80 (BALLOONS) ×1 IMPLANT
CANISTER PENUMBRA ENGINE (MISCELLANEOUS) ×2 IMPLANT
CANNULA 5F STIFF (CANNULA) ×2 IMPLANT
CATH BEACON 5 .035 40 KMP TP (CATHETERS) ×1 IMPLANT
CATH BEACON 5 .038 40 KMP TP (CATHETERS) ×1
CATH CANNON HEMO 15FR 19 (HEMODIALYSIS SUPPLIES) ×2 IMPLANT
CATH EMBOLECTOMY 5FR (BALLOONS) ×2 IMPLANT
CATH INDIGO 7D KIT (CATHETERS) ×2 IMPLANT
CATH INDIGO SEP 7D (CATHETERS) ×2 IMPLANT
COVER PROBE U/S 5X48 (MISCELLANEOUS) ×4 IMPLANT
DERMABOND ADVANCED (GAUZE/BANDAGES/DRESSINGS) ×1
DERMABOND ADVANCED .7 DNX12 (GAUZE/BANDAGES/DRESSINGS) ×1 IMPLANT
DEVICE TORQUE .025-.038 (MISCELLANEOUS) ×2 IMPLANT
DRAPE BRACHIAL (DRAPES) ×2 IMPLANT
DRAPE INCISE IOBAN 66X45 STRL (DRAPES) ×2 IMPLANT
GOWN SRG XL LVL 3 NONREINFORCE (GOWNS) ×3 IMPLANT
GOWN STRL NON-REIN TWL XL LVL3 (GOWNS) ×3
GUIDEWIRE ANGLED .035 180CM (WIRE) ×2 IMPLANT
KIT ENCORE 26 ADVANTAGE (KITS) ×2 IMPLANT
PACK ANGIOGRAPHY (CUSTOM PROCEDURE TRAY) ×2 IMPLANT
SHEATH BRITE TIP 6FRX5.5 (SHEATH) ×4 IMPLANT
SHEATH BRITE TIP 7FRX5.5 (SHEATH) ×2 IMPLANT
SUT MNCRL AB 4-0 PS2 18 (SUTURE) ×6 IMPLANT
SUT SILK 0 FSL (SUTURE) ×4 IMPLANT
WIRE MAGIC TOR.035 180C (WIRE) ×2 IMPLANT

## 2021-03-01 NOTE — Op Note (Signed)
[facility]  Percutaneous Study/Intervention Procedural Note   Date of Surgery: 03/01/2021,5:35 PM  Surgeon:Yemariam Magar, Dolores Lory   Pre-operative Diagnosis: Complication dialysis access with thrombosis; superior vena cava syndrome; end-stage renal disease requiring hemodialysis  Post-operative diagnosis:  Same  Procedure(s) Performed:  1.  Contrast injection right arm brachial axillary AV graft  2.  Infusion of tPA right arm brachial axillary AV graft  3.  Mechanical thrombectomy right arm AV graft unsuccessful  4.  Angioplasty right arm AV graft at the venous anastomosis   5.  Angioplasty right subclavian  6.  Ultrasound-guided access for placement of sheath right arm AV access  7.  Placement of a right IJ tunnel catheter with ultrasound and fluoroscopic guidance  Anesthesia: Conscious sedation was administered by the interventional radiology RN under my direct supervision. IV Versed plus fentanyl were utilized. Continuous ECG, pulse oximetry and blood pressure was monitored throughout the entire procedure. Conscious sedation was administered for a total of 100 minutes.  Sheath: 7 French sheath antegrade direction right arm brachial axillary AV graft; 6 French sheath right arm brachial axillary AV graft retrograde direction  Contrast: 35 cc   Fluoroscopy Time: 7.0 minutes  Indications: Patient is sent from dialysis with thrombosed AV graft.  Risks and benefits for graft salvage and placement of catheter reviewed all questions were answered patient has agreed to proceed.  Procedure:  Larry Morrison a 50 y.o. male who was identified and appropriate procedural time out was performed.  The patient was then placed supine on the table and prepped and draped in the usual sterile fashion for a right arm graft thrombectomy.  Ultrasound was used to evaluate the right arm AV graft it was noted to have heterogeneous material throughout consistent with thrombosis. An  ultrasound image was acquired for the permanent record.  A micropuncture needle was used to access the right arm AV graft in the antegrade direction under direct ultrasound guidance.  The microwire was then advanced under fluoroscopic guidance without difficulty followed by the micro-sheath.  A 0.035 J wire was advanced without resistance and a 7 Fr sheath was placed.    Floppy Glidewire and Kumpe catheter were then used to negotiate through the graft and into the central venous system.  Hand-injection contrast demonstrates the and innominate vein and superior vena cava.  There is a greater than 80% stenosis within the and innominate vein.  The catheter is then pulled back into the axillary vein there is a stent extending from the axillary vein into the subclavian vein past its midportion and there is greater than 90% stenosis at the leading edge of the stent as well as the proximal edge of the stent.  As the catheter is pulled back there is a greater than 80% stenosis at the venous anastomosis and thrombus in the vein at this level as well as throughout the entire visualized portions of the AV graft.  This is consistent with the ultrasound.  4000 units of heparin is given 12 mg of tPA was reconstituted and then using the Kumpe catheter it is laced throughout the entire length of the AV graft saving 2 mg for the arterial anastomosis.  tPA was allowed to dwell for approximately 20 minutes.  The Penumbra CAT 7 is then delivered onto the field and mechanical thrombectomy of the AV graft and venous anastomotic area is performed.  Close to a dozen passes were made both with and without separator.  After completion there is still significant residual thrombus  particularly at the venous anastomosis.  Magic torque wire was then advanced into the central venous system and an 8 mm x 60 mm [place] balloon was advanced over the anastomosis first and inflated to 14 atm for approximately 1 minute.  Is then positioned  across the leading edge of the subclavian stent and angioplasty was performed to 12 atm for 1 minute.  Is then repositioned to the proximal edge of the stent extending into the and [place] vein.  The balloon is removed and the CAT 7 is reintroduced and again thrombectomy is performed.  Thrombus is somewhat improved and I felt that if we could reestablish flow through the arterial we might be able to achieve an adequate thrombectomy.  Therefore the lidocaine was infiltrated overlying the AV graft proximally the microneedle was then inserted in a retrograde direction followed by the microwire and then the micro sheath.  J-wire followed by a 6 French sheath.  The Kumpe and floppy Glidewire were then negotiated through the graft and into the brachial artery proximally.  Hand-injection contrast demonstrates flow throughout the brachial artery and there is thrombus located within the arterial portion of the graft that appears to be blocked by the 7 [place] sheath.  I then returned the dilator and wire to the 7 [place] sheath and Advanced a Magic Torque Wire under fluoroscopic guidance.  Kumpe catheter was then removed and the over-the-wire Fogarty was advanced.  Beginning just proximal to the anastomosis the balloon was inflated under fluoroscopic guidance and I began the pullback of the Fogarty catheter.  Just after the balloon entered the graft its proper the Fogarty catheter broke.  We did not have any other catheters and at this point I did not have any other options to attempt to complete salvage and I was forced to abandon thrombectomy and salvage of this graft.  The patient's right neck and chest wall was then reprepped and draped in a sterile fashion.  Ultrasound was returned to his the field in a sterile sleeve.  Imaging demonstrated the jugular vein on the right was echolucent and compressible indicating patency.  1% lidocaine was Intran soft tissues and a microneedle was inserted under direct ultrasound  visualization.  Image was recorded for the permanent record.  Microwire was then inserted followed by the micro sheath.  J-wire was then advanced under fluoroscopic guidance.  It was positioned with its J in the inferior vena cava.  Counterincision was made with 11 blade scalpel at the base of the neck at the wire insertion site.  Exit site was also selected and a small incision was created.  The serial dilators dilators were then advanced over the wire and the dilator peel-away sheath inserted.  A 19 cm tip to cuff to piece palindrome catheter been selected.  Catheter was inserted into the central venous system through the peel-away sheath and peel-away sheath removed.  Under fluoroscopic guidance the tips were positioned at the atriocaval junction and the exit site confirmed.  Tunneling device was then advanced from the exit site to the neck counterincision.  Catheter was connected to the tunneler and pulled subcutaneously.  Catheter was then transected and the hub assembly connected after final positioning was achieved.  Both lumens aspirated and flushed easily and were packed with heparin per protocol.  Sterile caps were applied.  The neck counterincision was closed with a 4-0 Monocryl subcuticular followed by Dermabond.  Pursestring suture of 4 Monocryl was placed around the exit site.  0 silk sutures were used  to secure the catheter to the chest wall.   Sterile dressing was applied with a Biopatch.   Disposition: Patient was taken to the recovery room in stable condition having tolerated the procedure well.  Belenda Cruise Arasely Akkerman 03/01/2021,5:35 PM

## 2021-03-01 NOTE — Progress Notes (Signed)
Pt reattached to SCD's

## 2021-03-01 NOTE — Consult Note (Signed)
[facility] Vascular Consult Note  MRN : [id number]  Larry Morrison is a 50 y.o. 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   Patient denies any other symptoms.  States he is otherwise in his normal state of health and is just here due to lack of AV fistula access.  Last dialysis on 10/7.  Unable to provide much other history per denies any shortness of breath or chest pain.  Notes chronic cough.   ED Course:  He was afebrile, mildly hypertensive systolic [id number] with heart rate of 93 and stable on room air.  No leukocytosis or anemia.  Platelet of 144.  Sodium of 141, potassium 4.4, creatinine of 7.53 with anion gap of 19.   BNP of greater than 4500, troponin of 55.  Chest x-ray shows left moderate to large pleural effusion with cardiomegaly.   Right AV fistula ultrasound shows AV fistula thrombus.  He was given 325 mg of aspirin.  Vascular surgery was consulted by Dr. Reesa Chew for endovascular intervention and stenting of an occluded dialysis access.  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9   Dialysis patient [facility])    Dyspnea    Foot drop    History of kidney stones    History of nephrostomy    Hypertension    Renal disorder    Stroke [facility])     Past Surgical History:  Procedure Laterality Date   AV FISTULA PLACEMENT Right 08/07/2019   Procedure: ARTERIOVENOUS (AV) FISTULA CREATION;  Surgeon: Algernon Huxley, MD;  Location: [facility] ORS;  Service: Vascular;  Laterality: Right;   AV FISTULA PLACEMENT Right 11/20/2019   Procedure: INSERTION OF ARTERIOVENOUS (AV) GORE-TEX GRAFT ARM;  Surgeon: Algernon Huxley, MD;  Location: [facility] ORS;  Service: Vascular;  Laterality: Right;   IR NEPHROSTOMY EXCHANGE RIGHT  09/10/2020   NEPHRECTOMY Left    NEPHRECTOMY     ORCHIECTOMY Right 10/27/2016   Procedure: PSB ORCHIECTOMY;  Surgeon: Hollice Espy, MD;  Location: [facility] ORS;  Service: Urology;  Laterality: Right;   ORCHIOPEXY Bilateral 10/27/2016   Procedure: ORCHIOPEXY ADULT;  Surgeon: Hollice Espy, MD;  Location: [facility] ORS;  Service: Urology;  Laterality: Bilateral;   SCROTAL EXPLORATION Bilateral 10/27/2016   Procedure: SCROTUM EXPLORATION;  Surgeon: Hollice Espy, MD;  Location: [facility] ORS;  Service: Urology;  Laterality: Bilateral;   Social History Social History   Tobacco Use   Smoking status: Former    Packs/day: 0.50    Types: Cigarettes   Smokeless tobacco: Never  Vaping Use   Vaping Use: Never used  Substance Use Topics   Alcohol use: No   Drug use: Yes    Types: Marijuana    Comment: today   Family History Family History  Family history unknown: Yes  Denies peripheral artery disease, venous disease or renal disease.  Allergies  Allergen Reactions   Vancomycin     Patient denies - repeated denial to pharm tech 09-05-2020   REVIEW OF SYSTEMS (Negative unless checked)  Constitutional: '[]'$ Weight loss  '[]'$ Fever  '[]'$ Chills Cardiac: '[]'$ Chest pain   '[]'$ Chest pressure   '[]'$ Palpitations   '[]'$ Shortness of breath when laying flat   '[]'$ Shortness of breath at rest   '[x]'$ Shortness of breath with exertion. Vascular:  '[]'$ Pain in legs with walking   '[]'$ Pain in legs at rest   '[]'$ Pain in legs when laying flat   '[]'$ Claudication   '[]'$ Pain in feet when walking  '[]'$ Pain  in feet at rest  '[]'$ Pain in feet when laying flat   '[]'$ History of DVT   '[]'$ Phlebitis   '[x]'$ Swelling in legs   '[]'$ Varicose veins   '[]'$ Non-healing ulcers Pulmonary:   '[]'$ Uses home oxygen   '[]'$ Productive cough   '[]'$ Hemoptysis   '[]'$ Wheeze  '[]'$ COPD   '[]'$ Asthma Neurologic:  '[]'$ Dizziness  '[]'$ Blackouts   '[]'$ Seizures   '[]'$ History of stroke   '[]'$ History of TIA  '[]'$ Aphasia   '[]'$ Temporary blindness   '[]'$ Dysphagia   '[]'$ Weakness or numbness in arms   '[]'$ Weakness or numbness in legs Musculoskeletal:  '[]'$ Arthritis   '[]'$ Joint swelling   '[]'$ Joint pain   '[]'$ Low back pain Hematologic:  '[]'$ Easy bruising  '[]'$ Easy bleeding   '[]'$ Hypercoagulable state   '[]'$ Anemic  '[]'$ Hepatitis Gastrointestinal:  '[]'$ Blood in stool   '[]'$ Vomiting blood  '[]'$ Gastroesophageal reflux/heartburn   '[]'$ Difficulty swallowing. Genitourinary:  '[x]'$ Chronic kidney disease   '[]'$ Difficult urination  '[]'$ Frequent urination  '[]'$ Burning with urination   '[]'$ Blood in urine Skin:  '[]'$ Rashes   '[]'$ Ulcers   '[]'$ Wounds Psychological:  '[]'$ History of anxiety   '[]'$  History of major depression.  Physical Examination  Vitals:   02/28/21 2236 03/01/21 0136 03/01/21 0811 03/01/21 0951  BP: (!) 160/115 (!) 132/102 (!) 157/120 (!) 151/107  Pulse: 93  87 84  Resp: 18  (!) 24   Temp: 97.6 F (36.4 C)  97.6 F (36.4 C)   TempSrc: Oral  Oral   SpO2: 96%  100% 99%  Weight:      Height:       Body mass index is 23.17 kg/m. Gen:  WD/WN, NAD Head: [place]/AT, No temporalis wasting. Prominent temp pulse not noted. Ear/Nose/Throat: Hearing grossly intact, nares w/o erythema or drainage, oropharynx w/o Erythema/Exudate Eyes: Sclera non-icteric, conjunctiva clear Neck: Trachea midline.  No JVD.  Pulmonary:  Good air movement, respirations not labored, equal bilaterally.  Cardiac: RRR, normal S1, S2. Vascular:  Vessel Right Left  Radial Palpable Palpable  Ulnar Palpable Palpable   Right upper extremity:  Dialysis access: No bruit or thrill noted.  Gastrointestinal: soft, non-tender/non-distended. No guarding/reflex.   Musculoskeletal: M/S 5/5 throughout.  Extremities without ischemic changes.  No deformity or atrophy. No edema. Neurologic: Bed / wheelchair bound  Psychiatric: Judgment intact, Mood & affect appropriate for pt's clinical situation. Dermatologic: No rashes or ulcers noted.  No cellulitis or open wounds. Lymph : No Cervical, Axillary, or Inguinal lymphadenopathy.  CBC Lab Results  Component Value Date   WBC 6.1 02/28/2021   HGB 14.2 02/28/2021   HCT 42.9 02/28/2021   MCV 98.6 02/28/2021   PLT 144 (L) 02/28/2021   BMET    Component Value Date/Time   NA 142 03/01/2021 0852   NA 138 10/22/2013 1732   K 4.6 03/01/2021 0852   K 4.7 10/22/2013 1732   CL 100 03/01/2021 0852   CL 112 (H) 10/22/2013 1732  CO2 23 03/01/2021 0852   CO2 20 (L) 10/22/2013 1732   GLUCOSE 77 03/01/2021 0852   GLUCOSE 68 10/22/2013 1732   BUN 72 (H) 03/01/2021 0852   BUN 74 (H) 10/22/2013 1732   CREATININE 8.06 (H) 03/01/2021 0852   CREATININE 5.22 (H) 10/22/2013 1732   CALCIUM 8.6 (L) 03/01/2021 0852   CALCIUM 9.3 10/22/2013 1732   GFRNONAA 7 (L) 03/01/2021 0852   GFRNONAA 12 (L) 10/22/2013 1732   GFRAA 9 (L) 11/20/2019 1407   GFRAA 14 (L) 10/22/2013 1732   Estimated Creatinine Clearance: 9 mL/min (A) (by C-G formula based on SCr of 8.06 mg/dL (H)).  COAG Lab Results  Component Value Date   INR 1.6 (H) 09/05/2020   INR 1.1 11/20/2019   INR 1.2 08/05/2019   Radiology DG Chest 2 View  Result Date: 02/28/2021 CLINICAL DATA:  Shortness of breath. EXAM: CHEST - 2 VIEW COMPARISON:  September 04, 2020. FINDINGS: Increased, now moderate to large left pleural effusion. Overlying left-sided opacities. Right lung is clear. No right-sided pleural effusion. No visible pneumothorax. Partially imaged right upper quadrant drain. Right axillary stent. Cardiac silhouette is partly obscured with visible portion similar to prior. Similar mild cardiomegaly. IMPRESSION: 1. Increased, now moderate to large left pleural  effusion. 2. Overlying left-sided opacities, most likely compressive atelectasis. Pneumonia or aspiration is not excluded. 3. Cardiomegaly. Electronically Signed   By: Margaretha Sheffield M.D.   On: 02/28/2021 14:23   [place] UPPER EXTREMITY ARTERIAL RIGHT LIMITED (GRAFT, SINGLE VESSEL)  Result Date: 02/28/2021 CLINICAL DATA:  Evaluate AV fistula. EXAM: Right UPPER EXTREMITY ARTERIAL DUPLEX SCAN TECHNIQUE: Gray-scale sonography as well as color Doppler and duplex ultrasound was performed to evaluate the arteries of the upper extremity. COMPARISON:  None. FINDINGS: Ultrasound examination of the right brachial basilic arteriovenous fistula was performed with grayscale, color-flow, and spectral Doppler images. No flow is demonstrated within the AV fistula itself. Heterogeneous echogenic thrombus is suggested. Flow is demonstrated in the brachial artery with velocity measured at 28 centimeters/second. Arterial flow is demonstrated at the distal elbow with velocity measured at 12 centimeters/second. IMPRESSION: No flow is demonstrated in the arteriovenous fistula in the right upper extremity. Electronically Signed   By: Lucienne Capers M.D.   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  1.  Nonfunctioning Dialysis Access: Patient presents with nonfunctioning right upper extremity dialysis access.  Last dialysis session was February 26, 2019.  At this time, patient does not have any other dialysis access and will need to dialyze in the very near future.  Recommend undergoing a right upper extremity dialysis access declot in an attempt to restore function.  Procedure,  risks and benefits were explained to the patient.  All questions were answered.  Patient wished to proceed.  2.  Large left-sided pleural effusion: Paracentesis approximately 900 cc of clear fluid removed.  3.  History of CVA: Wheelchair / bed bound.  Discussed with Dr. Francene Castle, PA-C 03/01/2021 10:07 AM  This note was created with Dragon medical transcription system.  Any error is purely unintentional.

## 2021-03-01 NOTE — Progress Notes (Signed)
PROGRESS NOTE    Larry Morrison  [id number] DOB: [birth year] DOA: 02/28/2021 PCP: [facility]   Brief Narrative: Taken from H&P.  Larry Morrison is a 50 y.o. 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 Last dialysis was on 10/7, Friday.  When he went yesterday for his dialysis they could not access his fistula and he was sent to ED for further management.  Vascular surgery is aware and will be taking to the OR later today followed by dialysis. Also found to have left-sided moderate to large pleural effusion and thoracentesis was ordered-initial labs appears transudate.  Subjective: Patient was seen and examined after the thoracentesis this morning.  Denies any pain or shortness of breath.  No other complaints.  Appears very malnourished.  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  Most likely a thrombus. Will be taken to the OR by vascular surgery for further management.  Left-sided pleural effusion., s/p thoracentesis-preliminary labs appears transudate. Might get benefit from extra fluid removal although does not appear volume overload on exam.  ESRD on HD MWF.  Nephrology is aware and he will get his dialysis after correction of AV fistula malfunctioning/thrombus by vascular surgery. No emergent need at this time.  Anemia with chronic kidney  disease - Hemoglobin stable 14.2   Nephrolithiasis, neurogenic bladder - Chronic right nephrostomy drain - Last exchange on 6/28 with IR at [facility] Needs to follow up as he is due for a change every 3 months. There was some nursing concern of pyuria, patient is afebrile with no leukocytosis. -Urine cultures ordered.  Hypertension.  Blood pressure mildly elevated. -Call back no home medications listed. -Continue with as needed hydralazine 80.  Hx of CVA -wheelchair bound .  Severe protein caloric malnutrition. Estimated body mass index is 20.59 kg/m as calculated from the following:   Height as of this encounter: '5\' 4"'$  (1.626 m).   Weight as of this encounter: 54.4 kg.  -Dietitian consult  Objective: Vitals:   03/01/21 1008 03/01/21 1130 03/01/21 1304 03/01/21 1458  BP: (!) 150/106 (!) 159/115  (!) 151/107  Pulse: 91 86  89  Resp:  (!) 24  (!) 30  Temp:  (!) 97.4 F (36.3 C)  97.7 F (36.5 C)  TempSrc:  Oral    SpO2: 97% 100%  99%  Weight:   54.4 kg   Height:        Intake/Output Summary (Last 24 hours) at 03/01/2021 1617 Last data filed at 02/28/2021 2200 Gross per 24 hour  Intake 0 ml  Output 0 ml  Net 0 ml   Filed Weights   02/28/21 1411 03/01/21 1304  Weight: 61.2 kg 54.4 kg    Examination:  General exam: Malnourished gentleman, appears calm and comfortable  Respiratory system: Clear to auscultation. Respiratory effort normal. Cardiovascular system: S1 & S2 heard, RRR.  Gastrointestinal system: Soft, nontender, nondistended, bowel sounds positive. Central nervous system: Alert and oriented. No focal  neurological deficits. Extremities: No edema, no cyanosis, pulses intact and symmetrical. Psychiatry: Judgement and insight appear normal.    DVT prophylaxis: Heparin Code Status: Full Family Communication:  Disposition Plan:  Status is: Inpatient  Remains inpatient appropriate because:Inpatient level of care appropriate due to severity of  illness  Dispo: The patient is from: Home              Anticipated d/c is to: Home              Patient currently is not medically stable to d/c.   Difficult to place patient No             Level of care: Med-Surg  All the records are reviewed and case discussed with Care Management/Social Worker. Management plans discussed with the patient, nursing and they are in agreement.  Consultants:  Nephrology Vascular surgery  Procedures:  Antimicrobials:   Data Reviewed: I have personally reviewed following labs and imaging studies  CBC: Recent Labs  Lab 02/28/21 1456  WBC 6.1  HGB 14.2  HCT 42.9  MCV 98.6  PLT 123456*   Basic Metabolic Panel: Recent Labs  Lab 02/28/21 1456 03/01/21 0852  NA 141 142  K 4.4 4.6  CL 100 100  CO2 22 23  GLUCOSE 91 77  BUN 63* 72*  CREATININE 7.53* 8.06*  CALCIUM 8.7* 8.6*  PHOS 9.8*  --    GFR: Estimated Creatinine Clearance: 8.2 mL/min (A) (by C-G formula based on SCr of 8.06 mg/dL (H)). Liver Function Tests: Recent Labs  Lab 02/28/21 1456  AST 24  ALT 16  ALKPHOS 112  BILITOT 2.1*  PROT 8.7*  ALBUMIN 3.1*   No results for input(s): LIPASE, AMYLASE in the last 168 hours. No results for input(s): AMMONIA in the last 168 hours. Coagulation Profile: No results for input(s): INR, PROTIME in the last 168 hours. Cardiac Enzymes: No results for input(s): CKTOTAL, CKMB, CKMBINDEX, TROPONINI in the last 168 hours. BNP (last 3 results) No results for input(s): PROBNP in the last 8760 hours. HbA1C: No results for input(s): HGBA1C in the last 72 hours. CBG: No results for input(s): GLUCAP in the last 168 hours. Lipid Profile: No results for input(s): CHOL, HDL, LDLCALC, TRIG, CHOLHDL, LDLDIRECT in the last 72 hours. Thyroid Function Tests: No results for input(s): TSH, T4TOTAL, FREET4, T3FREE, THYROIDAB in the last 72 hours. Anemia Panel: No results for input(s): VITAMINB12, FOLATE, FERRITIN, TIBC, IRON, RETICCTPCT in the last 72  hours. Sepsis Labs: No results for input(s): PROCALCITON, LATICACIDVEN in the last 168 hours.  Recent Results (from the past 240 hour(s))  Resp Panel by RT-PCR (Flu A&B, Covid) Nasopharyngeal Swab     Status: None   Collection Time: 02/28/21  6:32 PM   Specimen: Nasopharyngeal Swab; Nasopharyngeal(NP) swabs in vial transport medium  Result Value Ref Range Status   SARS Coronavirus 2 by RT PCR NEGATIVE NEGATIVE Final    Comment: (NOTE) SARS-CoV-2 target nucleic acids are NOT DETECTED.  The SARS-CoV-2 RNA is generally detectable in upper respiratory specimens during the acute phase of infection. The lowest concentration of SARS-CoV-2 viral copies this assay can detect is 138 copies/mL. A negative result does not preclude SARS-Cov-2 infection and should not be used as the sole basis for treatment or other patient management decisions. A negative result may occur with  improper specimen collection/handling, submission of specimen other than nasopharyngeal swab, presence of viral mutation(s) within the areas targeted by this assay, and inadequate number of viral copies(<138  copies/mL). A negative result must be combined with clinical observations, patient history, and epidemiological information. The expected result is Negative.  Fact Sheet for Patients:  EntrepreneurPulse.com.au  Fact Sheet for Healthcare Providers:  IncredibleEmployment.be  This test is no t yet approved or cleared by the [place] FDA and  has been authorized for detection and/or diagnosis of SARS-CoV-2 by FDA under an Emergency Use Authorization (EUA). This EUA will remain  in effect (meaning this test can be used) for the duration of the COVID-19 declaration under Section 564(b)(1) of the Act, 21 U.S.C.section 360bbb-3(b)(1), unless the authorization is terminated  or revoked sooner.       Influenza A by PCR NEGATIVE NEGATIVE Final   Influenza B by PCR NEGATIVE  NEGATIVE Final    Comment: (NOTE) The Xpert Xpress SARS-CoV-2/FLU/RSV plus assay is intended as an aid in the diagnosis of influenza from Nasopharyngeal swab specimens and should not be used as a sole basis for treatment. Nasal washings and aspirates are unacceptable for Xpert Xpress SARS-CoV-2/FLU/RSV testing.  Fact Sheet for Patients: EntrepreneurPulse.com.au  Fact Sheet for Healthcare Providers: IncredibleEmployment.be  This test is not yet approved or cleared by the [place] FDA and has been authorized for detection and/or diagnosis of SARS-CoV-2 by FDA under an Emergency Use Authorization (EUA). This EUA will remain in effect (meaning this test can be used) for the duration of the COVID-19 declaration under Section 564(b)(1) of the Act, 21 U.S.C. section 360bbb-3(b)(1), unless the authorization is terminated or revoked.  Performed at [facility], [address] [place], [place] [address]   Body fluid culture w Gram Stain     Status: None (Preliminary result)   Collection Time: 03/01/21 10:01 AM   Specimen: PATH Cytology Pleural fluid  Result Value Ref Range Status   Specimen Description   Final    PLEURAL Performed at [facility], [address], [place], [place] [address]    Special Requests   Final    NONE Performed at [facility], [address] [place], [place] [address]    Gram Stain   Final    NO WBC SEEN NO ORGANISMS SEEN Performed at [facility], [place] [address] [place],  [address]    Culture PENDING  Incomplete   Report Status PENDING  Incomplete     Radiology Studies: DG Chest 2 View  Result Date: 02/28/2021 CLINICAL DATA:  Shortness of breath. EXAM: CHEST - 2 VIEW COMPARISON:  September 04, 2020. FINDINGS: Increased, now moderate to large left pleural effusion. Overlying left-sided opacities. Right lung is clear. No right-sided pleural effusion. No visible  pneumothorax. Partially imaged right upper quadrant drain. Right axillary stent. Cardiac silhouette is partly obscured with visible portion similar to prior. Similar mild cardiomegaly. IMPRESSION: 1. Increased, now moderate to large left pleural effusion. 2. Overlying left-sided opacities, most likely compressive atelectasis. Pneumonia or aspiration is not excluded. 3. Cardiomegaly. Electronically Signed   By: Margaretha Sheffield M.D.   On: 02/28/2021 14:23   [place] UPPER EXTREMITY ARTERIAL RIGHT LIMITED (GRAFT, SINGLE VESSEL)  Result Date: 02/28/2021 CLINICAL DATA:  Evaluate AV fistula. EXAM: Right UPPER EXTREMITY ARTERIAL DUPLEX SCAN TECHNIQUE: Gray-scale sonography as well as color Doppler and duplex ultrasound was performed to evaluate the arteries of the upper extremity. COMPARISON:  None. FINDINGS: Ultrasound examination of the right brachial basilic arteriovenous fistula was performed with grayscale, color-flow, and spectral Doppler images. No flow is demonstrated within the AV fistula itself. Heterogeneous echogenic thrombus is suggested.  Flow is demonstrated in the brachial artery with velocity measured at 28 centimeters/second. Arterial flow is demonstrated at the distal elbow with velocity measured at 12 centimeters/second. IMPRESSION: No flow is demonstrated in the arteriovenous fistula in the right upper extremity. Electronically Signed   By: Lucienne Capers M.D.   On: 02/28/2021 19:45   DG Chest Port 1 View  Result Date: 03/01/2021 CLINICAL DATA:  Status post left-sided thoracentesis EXAM: PORTABLE CHEST 1 VIEW COMPARISON:  02/28/2021 FINDINGS: Right axillary vascular stent. Numerous leads and wires project over the chest. Right upper quadrant percutaneous drain. Midline trachea. Mild cardiomegaly. Decrease in tiny left pleural effusion. No pneumothorax. Suspect mild pulmonary venous congestion. Improved left-sided aeration with areas of compressive atelectasis remaining. IMPRESSION: Decreased  left-sided pleural effusion, without pneumothorax or other acute complication. Cardiomegaly with suspicion of mild pulmonary venous congestion. Electronically Signed   By: Abigail Miyamoto M.D.   On: 03/01/2021 10:48   US THORACENTESIS ASP PLEURAL SPACE W/IMG GUIDE  Result Date: 03/01/2021 INDICATION: Left pleural effusion request received for diagnostic and therapeutic thoracentesis. EXAM: ULTRASOUND GUIDED LEFT THORACENTESIS MEDICATIONS: Local 1% lidocaine only. COMPLICATIONS: None immediate. PROCEDURE: An ultrasound guided thoracentesis was thoroughly discussed with the patient and questions answered. The benefits, risks, alternatives and complications were also discussed. The patient understands and wishes to proceed with the procedure. Written consent was obtained. Ultrasound was performed to localize and mark an adequate pocket of fluid in the left chest. The area was then prepped and draped in the normal sterile fashion. 1% Lidocaine was used for local anesthesia. Under ultrasound guidance a 6 Fr Safe-T-Centesis catheter was introduced. Thoracentesis was performed. The procedure was stopped early with small amount of remaining fluid seen given patient's excessive coughing. Vital signs were monitored throughout the procedure and remained stable. The catheter was removed and a dressing applied. FINDINGS: A total of approximately 900 mL of clear yellow fluid was removed. Samples were sent to the laboratory as requested by the clinical team. IMPRESSION: Successful ultrasound guided left thoracentesis yielding 900 mL of pleural fluid. Read By: Tsosie Billing PA-C Electronically Signed   By: Aletta Edouard M.D.   On: 03/01/2021 10:34    Scheduled Meds:  [MAR Hold] (feeding supplement) PROSource Plus  30 mL Oral BID BM   [START ON 03/02/2021] Chlorhexidine Gluconate Cloth  6 each Topical Q0600   [MAR Hold] feeding supplement  237 mL Oral Q24H   fentaNYL       heparin sodium (porcine)       midazolam        midazolam       [MAR Hold] multivitamin  1 tablet Oral QHS   Continuous Infusions:  sodium chloride     ceFAZolin     [START ON 03/02/2021]  ceFAZolin (ANCEF) IV       LOS: 1 day   Time spent: 40 minutes. More than 50% of the time was spent in counseling/coordination of care  Lorella Nimrod, MD [facility]  If 7PM-7AM, please contact night-coverage [email or web address]  03/01/2021, 4:17 PM   This record has been created using Systems analyst. Errors have been sought and corrected,but may not always be located. Such creation errors do not reflect on the standard of care.

## 2021-03-01 NOTE — Progress Notes (Signed)
Initial Nutrition Assessment  DOCUMENTATION CODES:   Severe malnutrition in context of chronic illness  INTERVENTION:   Ensure Enlive po daily, each supplement provides 350 kcal and 20 grams of protein  Magic cup BID with meals, each supplement provides 290 kcal and 9 grams of protein  Pro-Source Plus 30ml PO BID- Each supplement provides 100kcal and 15g protein   Rena-vit po daily   NUTRITION DIAGNOSIS:   Severe Malnutrition related to chronic illness (ESRD on HD) as evidenced by severe fat depletion, severe muscle depletion.  GOAL:   Patient will meet greater than or equal to 90% of their needs  MONITOR:   PO intake, Supplement acceptance, Labs, Weight trends, Skin, I & O's  REASON FOR ASSESSMENT:   Malnutrition Screening Tool    ASSESSMENT:   50 y.o. male with medical history significant for CVA with residual lower extremity weakness/wheelchair-bound, ESRD on hemodialysis MWF, chronic right nephrostomy drain, nephrolithiasis, neurogenic bladder, anemia of chronic disease, COVID 19 (1/22) and hypertension who was brought to ED from dialysis center due to inability to access right AV fistula and pleural effusion.  Pt s/p thoracentesis today with 900ml output  Met with pt in room today. Pt reports fair appetite and oral intake at baseline. Pt reports that he is hungry today and is asking for food. Pt currently NPO for dialysis access declot today. RD discussed with pt the importance of adequate nutrition needed to preserve lean muscle and to replace losses from HD. Pt reluctant to drink supplements; pt reports that he hates supplements but he does take ProSource/Prostat during dialysis "because I have to". Pt finally agrees to try ProSource, strawberry Ensure and orange Magic Cups. RD will order supplements and vitamins to help pt meet his estimated needs. Per chart, pt appears fairly weight stable at baseline.   Medications reviewed and include:   Labs reviewed: K 4.6 wnl,  BUN 72(H), creat 8.06(H) P 9.8(H)- 10/10 BNP >4500(H)- 10/10   NUTRITION - FOCUSED PHYSICAL EXAM:  Flowsheet Row Most Recent Value  Orbital Region Mild depletion  Upper Arm Region Severe depletion  Thoracic and Lumbar Region Severe depletion  Buccal Region Mild depletion  Temple Region Mild depletion  Clavicle Bone Region Severe depletion  Clavicle and Acromion Bone Region Severe depletion  Scapular Bone Region Severe depletion  Dorsal Hand Severe depletion  Patellar Region Severe depletion  Anterior Thigh Region Severe depletion  Posterior Calf Region Severe depletion  Edema (RD Assessment) None  Hair Reviewed  Eyes Reviewed  Mouth Reviewed  Skin Reviewed  Nails Reviewed   Diet Order:   Diet Order             Diet NPO time specified  Diet effective now                  EDUCATION NEEDS:   Education needs have been addressed  Skin:  Skin Assessment: Reviewed RN Assessment  Last BM:  10/10  Height:   Ht Readings from Last 1 Encounters:  02/28/21 5' 4" (1.626 m)    Weight:   Wt Readings from Last 1 Encounters:  02/28/21 61.2 kg    Ideal Body Weight:  54.5 kg  BMI:  Body mass index is 23.17 kg/m.  Estimated Nutritional Needs:   Kcal:  1700-1900kcal/day  Protein:  85-95g/day  Fluid:  UOP +1L  Casey Campbell MS, RD, LDN Please refer to AMION for RD and/or RD on-call/weekend/after hours pager  

## 2021-03-01 NOTE — Progress Notes (Signed)
Patient refused vital signs due to him being asleep. He asked nurse to take his vitals when he wakes because he wants to be left alone when he is sleeping. Will attempt vitals again when patient is awake.

## 2021-03-01 NOTE — Progress Notes (Signed)
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  He presents to the emergency room from dialysis with inability to access AV fistula.  Patient was sent for evaluation of fistula  Patient is known to our clinic and receives outpatient dialysis treatments at [facility], supervised by Dr. Juleen China.  Patient last received successful dialysis on Friday.  On arrival to dialysis on Monday, center was unable to successfully access fistula.  He was sent to the emergency room for evaluation, vascular consulted.  Patient currently resting comfortably.  Denies shortness of breath or cough.  Labs on ED arrival showed no significant electrolyte imbalance.  We have been consulted to manage dialysis needs.    Objective:  Vital signs in last 24 hours:  Temp:  [97.4 F (36.3 C)-97.7 F (36.5 C)] 97.7 F (36.5 C) (10/11 1458) Pulse Rate:  [84-93] 89 (10/11 1458) Resp:  [18-30] 30 (10/11 1458) BP: (132-160)/(102-120) 151/107 (10/11 1458) SpO2:  [96 %-100 %] 99 % (10/11 1458) Weight:  [54.4 kg] 54.4 kg (10/11 1304)  Weight change:  Filed Weights   02/28/21 1411 03/01/21 1304  Weight: 61.2 kg 54.4 kg    Intake/Output: No intake/output data recorded.   Intake/Output this shift:  No intake/output data recorded.  Physical Exam: General: NAD, resting in bed  Head: Normocephalic, atraumatic. Moist oral mucosal membranes  Eyes: Anicteric  Lungs:  Basilar crackles bilaterally, normal effort, 2 L [place]  Heart: Regular rate and rhythm  Abdomen:  Soft, nontender,   Extremities: No peripheral edema.  Neurologic: Nonfocal, moving all four extremities  Skin: No lesions  Access: Right aVF no bruit at or thrill    Basic Metabolic  Panel: Recent Labs  Lab 02/28/21 1456 03/01/21 0852  NA 141 142  K 4.4 4.6  CL 100 100  CO2 22 23  GLUCOSE 91 77  BUN 63* 72*  CREATININE 7.53* 8.06*  CALCIUM 8.7* 8.6*  PHOS 9.8*  --     Liver Function Tests: Recent Labs  Lab 02/28/21 1456  AST 24  ALT 16  ALKPHOS 112  BILITOT 2.1*  PROT 8.7*  ALBUMIN 3.1*   No results for input(s): LIPASE, AMYLASE in the last 168 hours. No results for input(s): AMMONIA in the last 168 hours.  CBC: Recent Labs  Lab 02/28/21 1456  WBC 6.1  HGB 14.2  HCT 42.9  MCV 98.6  PLT 144*    Cardiac Enzymes: No results for input(s): CKTOTAL, CKMB, CKMBINDEX, TROPONINI in the last 168 hours.  BNP: Invalid input(s): POCBNP  CBG: No results for input(s): GLUCAP in the last 168 hours.  Microbiology: Results for orders placed or performed during the hospital encounter of 02/28/21  Resp Panel by RT-PCR (Flu A&B, Covid) Nasopharyngeal Swab     Status: None   Collection Time: 02/28/21  6:32 PM   Specimen: Nasopharyngeal Swab; Nasopharyngeal(NP) swabs in vial transport medium  Result Value Ref Range Status   SARS Coronavirus 2 by RT PCR NEGATIVE NEGATIVE Final    Comment: (NOTE) SARS-CoV-2 target nucleic acids are NOT DETECTED.  The SARS-CoV-2 RNA is generally detectable in upper respiratory specimens during the acute phase of infection. The lowest concentration of SARS-CoV-2 viral copies this assay can detect is 138 copies/mL. A negative result does not preclude SARS-Cov-2 infection and should not  be used as the sole basis for treatment or other patient management decisions. A negative result may occur with  improper specimen collection/handling, submission of specimen other than nasopharyngeal swab, presence of viral mutation(s) within the areas targeted by this assay, and inadequate number of viral copies(<138 copies/mL). A negative result must be combined with clinical observations, patient history, and  epidemiological information. The expected result is Negative.  Fact Sheet for Patients:  EntrepreneurPulse.com.au  Fact Sheet for Healthcare Providers:  IncredibleEmployment.be  This test is no t yet approved or cleared by the [place] FDA and  has been authorized for detection and/or diagnosis of SARS-CoV-2 by FDA under an Emergency Use Authorization (EUA). This EUA will remain  in effect (meaning this test can be used) for the duration of the COVID-19 declaration under Section 564(b)(1) of the Act, 21 U.S.C.section 360bbb-3(b)(1), unless the authorization is terminated  or revoked sooner.       Influenza A by PCR NEGATIVE NEGATIVE Final   Influenza B by PCR NEGATIVE NEGATIVE Final    Comment: (NOTE) The Xpert Xpress SARS-CoV-2/FLU/RSV plus assay is intended as an aid in the diagnosis of influenza from Nasopharyngeal swab specimens and should not be used as a sole basis for treatment. Nasal washings and aspirates are unacceptable for Xpert Xpress SARS-CoV-2/FLU/RSV testing.  Fact Sheet for Patients: EntrepreneurPulse.com.au  Fact Sheet for Healthcare Providers: IncredibleEmployment.be  This test is not yet approved or cleared by the [place] FDA and has been authorized for detection and/or diagnosis of SARS-CoV-2 by FDA under an Emergency Use Authorization (EUA). This EUA will remain in effect (meaning this test can be used) for the duration of the COVID-19 declaration under Section 564(b)(1) of the Act, 21 U.S.C. section 360bbb-3(b)(1), unless the authorization is terminated or revoked.  Performed at [facility], [address] [place], [place] [address]   Body fluid culture w Gram Stain     Status: None (Preliminary result)   Collection Time: 03/01/21 10:01 AM   Specimen: PATH Cytology Pleural fluid  Result Value Ref Range Status   Specimen Description   Final     PLEURAL Performed at [facility], [address], [place], [place] [address]    Special Requests   Final    NONE Performed at [facility], [address] [place], [place] [address]    Gram Stain   Final    NO WBC SEEN NO ORGANISMS SEEN Performed at [facility], [place] [address], [place], [place] [address]    Culture PENDING  Incomplete   Report Status PENDING  Incomplete    Coagulation Studies: No results for input(s): LABPROT, INR in the last 72 hours.  Urinalysis: No results for input(s): COLORURINE, LABSPEC, PHURINE, GLUCOSEU, HGBUR, BILIRUBINUR, KETONESUR, PROTEINUR, UROBILINOGEN, NITRITE, LEUKOCYTESUR in the last 72 hours.  Invalid input(s): APPERANCEUR    Imaging: DG Chest 2 View  Result Date: 02/28/2021 CLINICAL DATA:  Shortness of breath. EXAM: CHEST - 2 VIEW COMPARISON:  September 04, 2020. FINDINGS: Increased, now moderate to large left pleural effusion. Overlying left-sided opacities. Right lung is clear. No right-sided pleural effusion. No visible pneumothorax. Partially imaged right upper quadrant drain. Right axillary stent. Cardiac silhouette is partly obscured with visible portion similar to prior. Similar mild cardiomegaly. IMPRESSION: 1. Increased, now moderate to large left pleural effusion. 2. Overlying left-sided opacities, most likely compressive atelectasis. Pneumonia or aspiration is not excluded. 3. Cardiomegaly. Electronically Signed   By: Margaretha Sheffield M.D.   On:  02/28/2021 14:23   [place] UPPER EXTREMITY ARTERIAL RIGHT LIMITED (GRAFT, SINGLE VESSEL)  Result Date: 02/28/2021 CLINICAL DATA:  Evaluate AV fistula. EXAM: Right UPPER EXTREMITY ARTERIAL DUPLEX SCAN TECHNIQUE: Gray-scale sonography as well as color Doppler and duplex ultrasound was performed to evaluate the arteries of the upper extremity. COMPARISON:  None. FINDINGS: Ultrasound examination of the right brachial basilic arteriovenous fistula was performed with grayscale,  color-flow, and spectral Doppler images. No flow is demonstrated within the AV fistula itself. Heterogeneous echogenic thrombus is suggested. Flow is demonstrated in the brachial artery with velocity measured at 28 centimeters/second. Arterial flow is demonstrated at the distal elbow with velocity measured at 12 centimeters/second. IMPRESSION: No flow is demonstrated in the arteriovenous fistula in the right upper extremity. Electronically Signed   By: Lucienne Capers M.D.   On: 02/28/2021 19:45   DG Chest Port 1 View  Result Date: 03/01/2021 CLINICAL DATA:  Status post left-sided thoracentesis EXAM: PORTABLE CHEST 1 VIEW COMPARISON:  02/28/2021 FINDINGS: Right axillary vascular stent. Numerous leads and wires project over the chest. Right upper quadrant percutaneous drain. Midline trachea. Mild cardiomegaly. Decrease in tiny left pleural effusion. No pneumothorax. Suspect mild pulmonary venous congestion. Improved left-sided aeration with areas of compressive atelectasis remaining. IMPRESSION: Decreased left-sided pleural effusion, without pneumothorax or other acute complication. Cardiomegaly with suspicion of mild pulmonary venous congestion. Electronically Signed   By: Abigail Miyamoto M.D.   On: 03/01/2021 10:48   US THORACENTESIS ASP PLEURAL SPACE W/IMG GUIDE  Result Date: 03/01/2021 INDICATION: Left pleural effusion request received for diagnostic and therapeutic thoracentesis. EXAM: ULTRASOUND GUIDED LEFT THORACENTESIS MEDICATIONS: Local 1% lidocaine only. COMPLICATIONS: None immediate. PROCEDURE: An ultrasound guided thoracentesis was thoroughly discussed with the patient and questions answered. The benefits, risks, alternatives and complications were also discussed. The patient understands and wishes to proceed with the procedure. Written consent was obtained. Ultrasound was performed to localize and mark an adequate pocket of fluid in the left chest. The area was then prepped and draped in the  normal sterile fashion. 1% Lidocaine was used for local anesthesia. Under ultrasound guidance a 6 Fr Safe-T-Centesis catheter was introduced. Thoracentesis was performed. The procedure was stopped early with small amount of remaining fluid seen given patient's excessive coughing. Vital signs were monitored throughout the procedure and remained stable. The catheter was removed and a dressing applied. FINDINGS: A total of approximately 900 mL of clear yellow fluid was removed. Samples were sent to the laboratory as requested by the clinical team. IMPRESSION: Successful ultrasound guided left thoracentesis yielding 900 mL of pleural fluid. Read By: Tsosie Billing PA-C Electronically Signed   By: Aletta Edouard M.D.   On: 03/01/2021 10:34     Medications:    ceFAZolin     sodium chloride     [START ON 03/02/2021]  ceFAZolin (ANCEF) IV      [MAR Hold] (feeding supplement) PROSource Plus  30 mL Oral BID BM   [MAR Hold] feeding supplement  237 mL Oral Q24H   [MAR Hold] multivitamin  1 tablet Oral QHS   diphenhydrAMINE, famotidine, [MAR Hold] hydrALAZINE, [MAR Hold]  HYDROmorphone (DILAUDID) injection, methylPREDNISolone (SOLU-MEDROL) injection, midazolam, [MAR Hold] ondansetron (ZOFRAN) IV  Assessment/ Plan:  [name] is a 50 y.o.  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  He presents to the emergency room from dialysis with inability to access AV fistula.  Patient  was sent for evaluation of AVF (arteriovenous fistula) (HCC) [I77.0] SOB (shortness of breath) [R06.02] Pleural effusion [J90] ESRD (end stage renal disease) ([place]) [N18.6] Troponin I above reference range [R77.8] Arteriovenous fistula occlusion, initial encounter ([place]) [id number]   [facility] M[facility] Left AVF 49kg   End-stage renal disease on  hemodialysis; will maintain outpatient schedule during admission if possible.  Last hemodialysis was on 02/25/2021.  Scheduled with vascular to evaluate and declot right AV fistula.  Plan to dialyze after procedure  2. Anemia of chronic kidney disease  Lab Results  Component Value Date   HGB 14.2 02/28/2021  Hemoglobin above target, no need for ESA's at this time  3. Secondary Hyperparathyroidism:  Lab Results  Component Value Date   CALCIUM 8.6 (L) 03/01/2021   CAION 0.76 (LL) 08/07/2019   PHOS 9.8 (H) 02/28/2021    Calcium and phosphorus not at target. Appears to be on Renvela outpatient We will continue to monitor bone minerals  4.  Hypertension with chronic kidney disease.  Blood pressure currently 151/107.  Prescribed as needed IV hydralazine.  IV labetalol given once.  5.  Nonfunctioning dialysis access Appreciate vascular evaluation and treatment    LOS: 1 [place] 10/11/20223:04 PM

## 2021-03-01 NOTE — Progress Notes (Signed)
Patient has refused his lab draws again this morning. Patient educated on need for labs to be drawn to assure adequate care and patient continued to decline.

## 2021-03-01 NOTE — Progress Notes (Signed)
Phlebotomist attempted to draw blood and vein blew, after that when she attempted again patient refused per phlebotomist report to nurse.

## 2021-03-01 NOTE — Progress Notes (Signed)
*  PRELIMINARY RESULTS* Echocardiogram 2D Echocardiogram has been performed.  Larry Morrison Larry Morrison 03/01/2021, 12:41 PM

## 2021-03-01 NOTE — Procedures (Signed)
PROCEDURE SUMMARY:  Successful US guided left thoracentesis. Yielded 900 mL of clear yellow fluid. Pt tolerated procedure well. No immediate complications. Procedure stopped with small amount of remaining fluid seen given patient's excessive coughing.  Specimen was sent for labs. CXR ordered.  EBL < 5 mL  Tsosie Billing D PA-C 03/01/2021 10:09 AM

## 2021-03-02 ENCOUNTER — Encounter: Payer: Self-pay | Admitting: Vascular Surgery

## 2021-03-02 DIAGNOSIS — I429 Cardiomyopathy, unspecified: Secondary | ICD-10-CM

## 2021-03-02 LAB — RENAL FUNCTION PANEL
Albumin: 2.4 g/dL — ABNORMAL LOW (ref 3.5–5.0)
Anion gap: 12 (ref 5–15)
BUN: 37 mg/dL — ABNORMAL HIGH (ref 6–20)
CO2: 28 mmol/L (ref 22–32)
Calcium: 8 mg/dL — ABNORMAL LOW (ref 8.9–10.3)
Chloride: 95 mmol/L — ABNORMAL LOW (ref 98–111)
Creatinine, Ser: 4.6 mg/dL — ABNORMAL HIGH (ref 0.61–1.24)
GFR, Estimated: 15 mL/min — ABNORMAL LOW (ref >60)
Glucose, Bld: 89 mg/dL (ref 70–99)
Phosphorus: 6.1 mg/dL — ABNORMAL HIGH (ref 2.5–4.6)
Potassium: 4 mmol/L (ref 3.5–5.1)
Sodium: 135 mmol/L (ref 135–145)

## 2021-03-02 LAB — LIPID PANEL
Cholesterol: 69 mg/dL (ref 0–200)
HDL: 22 mg/dL — ABNORMAL LOW (ref >40)
LDL Cholesterol: 35 mg/dL (ref 0–99)
Total CHOL/HDL Ratio: 3.1 RATIO
Triglycerides: 62 mg/dL (ref <150)
VLDL: 12 mg/dL (ref 0–40)

## 2021-03-02 LAB — HEPATITIS B SURFACE ANTIGEN: Hepatitis B Surface Ag: NONREACTIVE

## 2021-03-02 LAB — PROTEIN, BODY FLUID (OTHER): Total Protein, Body Fluid Other: 2.7 g/dL

## 2021-03-02 LAB — PROCALCITONIN: Procalcitonin: 3.03 ng/mL

## 2021-03-02 LAB — CBC
HCT: 32.2 % — ABNORMAL LOW (ref 39.0–52.0)
Hemoglobin: 10.8 g/dL — ABNORMAL LOW (ref 13.0–17.0)
MCH: 32.3 pg (ref 26.0–34.0)
MCHC: 33.5 g/dL (ref 30.0–36.0)
MCV: 96.4 fL (ref 80.0–100.0)
Platelets: 130 10*3/uL — ABNORMAL LOW (ref 150–400)
RBC: 3.34 MIL/uL — ABNORMAL LOW (ref 4.22–5.81)
RDW: 17.8 % — ABNORMAL HIGH (ref 11.5–15.5)
WBC: 5.7 10*3/uL (ref 4.0–10.5)
nRBC: 0 % (ref 0.0–0.2)

## 2021-03-02 LAB — CYTOLOGY - NON PAP

## 2021-03-02 LAB — HEPATITIS B SURFACE ANTIBODY,QUALITATIVE: Hep B S Ab: NONREACTIVE

## 2021-03-02 LAB — TROPONIN I (HIGH SENSITIVITY)
Troponin I (High Sensitivity): 173 ng/L (ref <18)
Troponin I (High Sensitivity): 174 ng/L (ref <18)

## 2021-03-02 LAB — MAGNESIUM: Magnesium: 1.5 mg/dL — ABNORMAL LOW (ref 1.7–2.4)

## 2021-03-02 MED ORDER — SEVELAMER CARBONATE 800 MG PO TABS
800.0000 mg | ORAL_TABLET | Freq: Three times a day (TID) | ORAL | Status: DC
  Administered 2021-03-02 – 2021-03-03 (×2): 800 mg via ORAL
  Filled 2021-03-02 (×2): qty 1

## 2021-03-02 MED ORDER — ASPIRIN EC 81 MG PO TBEC
81.0000 mg | DELAYED_RELEASE_TABLET | Freq: Every day | ORAL | Status: DC
  Administered 2021-03-02 – 2021-03-03 (×2): 81 mg via ORAL
  Filled 2021-03-02 (×2): qty 1

## 2021-03-02 MED ORDER — CARVEDILOL 6.25 MG PO TABS
3.1250 mg | ORAL_TABLET | Freq: Two times a day (BID) | ORAL | Status: DC
  Administered 2021-03-02 – 2021-03-03 (×2): 3.125 mg via ORAL
  Filled 2021-03-02 (×2): qty 1

## 2021-03-02 MED ORDER — LOSARTAN POTASSIUM 25 MG PO TABS
25.0000 mg | ORAL_TABLET | Freq: Every day | ORAL | Status: DC
  Administered 2021-03-02 – 2021-03-03 (×2): 25 mg via ORAL
  Filled 2021-03-02 (×2): qty 1

## 2021-03-02 MED ORDER — ROSUVASTATIN CALCIUM 10 MG PO TABS
40.0000 mg | ORAL_TABLET | Freq: Every day | ORAL | Status: DC
  Administered 2021-03-02 – 2021-03-03 (×2): 40 mg via ORAL
  Filled 2021-03-02 (×2): qty 4

## 2021-03-02 NOTE — TOC Initial Note (Signed)
Transition of Care [facility]) - Initial/Assessment Note    Patient Details  Name: Larry Morrison MRN: [id number] Date of Birth: Feb 18, 1971  [facility] CM/SW Contact:    Candie Chroman, LCSW Phone Number: 03/02/2021, 3:41 PM  Clinical Narrative:    Received call from HD coordinator. Patient's Medicaid recently lapsed and he lost his Medicaid transportation through [facility]. Patient has since had his Medicaid reinstated and therefore needs transportation set up again. Left voicemail for transportation dept at DSS to see if we can get that set up back up. Went by room to update patient. He said [facility] picked [facility] to [facility] him to HD on Monday. Notified HD coordinator and she said that was [employer] [facility] which is temporary.              Expected Discharge Plan: Home/Self Care Barriers to Discharge: Continued Medical Work up   Patient Goals and CMS Choice        Expected Discharge Plan and Services Expected Discharge Plan: Home/Self Care     Post Acute Care Choice: NA Living arrangements for the past 2 months: Single Family Home                                      Prior Living Arrangements/Services Living arrangements for the past 2 months: Single Family Home   Patient language and need for interpreter reviewed:: Yes Do you feel safe going back to the place where you live?: Yes      Need for Family Participation in Patient Care: Yes (Comment) Care giver support system in place?: Yes (comment)   Criminal Activity/Legal Involvement Pertinent to Current Situation/Hospitalization: No - Comment as needed  Activities of Daily Living Home Assistive Devices/Equipment: Shower chair with back, Wheelchair ADL Screening (condition at time of admission) Patient's cognitive ability adequate to safely complete daily activities?: Yes Is the patient deaf or have difficulty hearing?: No Does the patient have difficulty seeing, even when wearing glasses/contacts?:  No Does the patient have difficulty concentrating, remembering, or making decisions?: No Patient able to express need for assistance with ADLs?: Yes Does the patient have difficulty dressing or bathing?: Yes Independently performs ADLs?: No Communication: Independent Dressing (OT): Needs assistance Is this a change from baseline?: Pre-admission baseline Feeding: Independent Bathing: Needs assistance Is this a change from baseline?: Pre-admission baseline Toileting: Needs assistance Is this a change from baseline?: Pre-admission baseline In/Out Bed: Needs assistance Is this a change from baseline?: Pre-admission baseline Walks in Home: Dependent (wheelchair bound) Is this a change from baseline?: Pre-admission baseline Does the patient have difficulty walking or climbing stairs?: Yes Weakness of Legs: Both Weakness of Arms/Hands: Both  Permission Sought/Granted                  Emotional Assessment Appearance:: Appears stated age Attitude/Demeanor/Rapport: Engaged, Gracious Affect (typically observed): Accepting, Appropriate, Calm, Pleasant Orientation: : Oriented to Self, Oriented to Place, Oriented to  Time, Oriented to Situation Alcohol / Substance Use: Not Applicable Psych Involvement: No (comment)  Admission diagnosis:  AVF (arteriovenous fistula) (HCC) [I77.0] SOB (shortness of breath) [R06.02] Pleural effusion [J90] ESRD (end stage renal disease) (HCC) [N18.6] Troponin I above reference range [R77.8] Arteriovenous fistula occlusion, initial encounter [facility]) [id number] Patient Active Problem List   Diagnosis Date Noted   Cardiomyopathy ([place])    Protein-calorie malnutrition, severe 03/01/2021   Arteriovenous fistula occlusion (HCC)  Pleural effusion 02/28/2021   Arteriovenous fistula thrombosis (HCC) 99991111   Metabolic acidosis 99991111   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 02/28/2021   History of traumatic brain injury  09/07/2020   Cognitive impairment 99991111   Metabolic encephalopathy 0000000   ESRD needing dialysis ([place]) 09/04/2020   Hypoglycemia 09/04/2020   Uremia 09/04/2020   COVID-19 virus infection 06/18/2020   Stroke ([place]) 06/18/2020   Thrombocytopenia ([place]) 06/18/2020   Elevated troponin 06/18/2020   Hyperkalemia 06/13/2020   Anemia in ESRD (end-stage renal disease) ([place]) 09/17/2019   Essential hypertension 07/15/2019   Testicular torsion    Pyuria 06/10/2016   Chronic pain following surgery or procedure 12/01/2015   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 [place] [address] Phone: [phone] Fax: [phone]     Social Determinants of Health (SDOH) Interventions    Readmission Risk Interventions Readmission Risk Prevention Plan 09/08/2020  Transportation Screening Complete  Social Work Consult for [place] Planning/Counseling Complete  Palliative Care Screening Complete  Medication Review Press photographer) Complete  Some recent data might be hidden

## 2021-03-02 NOTE — Progress Notes (Signed)
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  He presents to the emergency room from dialysis with inability to access AV fistula.  Patient was sent for evaluation of fistula  Patient is known to our clinic and receives outpatient dialysis treatments at [facility], supervised by Dr. Juleen China.  Patient last received successful dialysis on Friday.   Patient seen resting in bed, tolerating meals Denies nausea, vomiting, and shortness of breath States dialysis last night went well.   Objective:  Vital signs in last 24 hours:  Temp:  [97.5 F (36.4 C)-98.3 F (36.8 C)] 98.3 F (36.8 C) (10/12 0822) Pulse Rate:  [37-89] 86 (10/12 0822) Resp:  [18-38] 18 (10/12 0822) BP: (88-151)/(51-107) 127/83 (10/12 0822) SpO2:  [98 %-100 %] 98 % (10/12 0822) Weight:  [54.4 kg] 54.4 kg (10/11 1304)  Weight change: -6.836 kg Filed Weights   02/28/21 1411 03/01/21 1304  Weight: 61.2 kg 54.4 kg    Intake/Output: I/O last 3 completed shifts: In: 64 [P.O.:237] Out: 1001 [Other:1001]   Intake/Output this shift:  Total I/O In: 180 [P.O.:180] Out: -   Physical Exam: General: NAD, resting in bed  Head: Normocephalic, atraumatic. Moist oral mucosal membranes  Eyes: Anicteric  Lungs:  Diminished in bases, normal effort, 2 L [place]  Heart: Regular rate and rhythm  Abdomen:  Soft, nontender   Extremities: No peripheral edema.  Neurologic: Nonfocal, moving all four extremities  Skin: No lesions, dry, flaky feet  Access: Right aVF no bruit at or thrill    Basic Metabolic Panel: Recent Labs  Lab 02/28/21 1456 03/01/21 0852 03/02/21 0544  NA 141 142 135  K 4.4 4.6 4.0  CL 100 100 95*  CO2 '22 23 28  '$ GLUCOSE 91 77 89   BUN 63* 72* 37*  CREATININE 7.53* 8.06* 4.60*  CALCIUM 8.7* 8.6* 8.0*  MG  --   --  1.5*  PHOS 9.8*  --  6.1*     Liver Function Tests: Recent Labs  Lab 02/28/21 1456 03/02/21 0544  AST 24  --   ALT 16  --   ALKPHOS 112  --   BILITOT 2.1*  --   PROT 8.7*  --   ALBUMIN 3.1* 2.4*    No results for input(s): LIPASE, AMYLASE in the last 168 hours. No results for input(s): AMMONIA in the last 168 hours.  CBC: Recent Labs  Lab 02/28/21 1456 03/02/21 0544  WBC 6.1 5.7  HGB 14.2 10.8*  HCT 42.9 32.2*  MCV 98.6 96.4  PLT 144* 130*     Cardiac Enzymes: No results for input(s): CKTOTAL, CKMB, CKMBINDEX, TROPONINI in the last 168 hours.  BNP: Invalid input(s): POCBNP  CBG: No results for input(s): GLUCAP in the last 168 hours.  Microbiology: Results for orders placed or performed during the hospital encounter of 02/28/21  Resp Panel by RT-PCR (Flu A&B, Covid) Nasopharyngeal Swab     Status: None   Collection Time: 02/28/21  6:32 PM   Specimen: Nasopharyngeal Swab; Nasopharyngeal(NP) swabs in vial transport medium  Result Value Ref Range Status   SARS Coronavirus 2 by RT PCR NEGATIVE NEGATIVE Final    Comment: (NOTE) SARS-CoV-2 target nucleic acids are NOT DETECTED.  The SARS-CoV-2 RNA is generally detectable in upper respiratory specimens during the acute phase  of infection. The lowest concentration of SARS-CoV-2 viral copies this assay can detect is 138 copies/mL. A negative result does not preclude SARS-Cov-2 infection and should not be used as the sole basis for treatment or other patient management decisions. A negative result may occur with  improper specimen collection/handling, submission of specimen other than nasopharyngeal swab, presence of viral mutation(s) within the areas targeted by this assay, and inadequate number of viral copies(<138 copies/mL). A negative result must be combined with clinical observations, patient history, and  epidemiological information. The expected result is Negative.  Fact Sheet for Patients:  EntrepreneurPulse.com.au  Fact Sheet for Healthcare Providers:  IncredibleEmployment.be  This test is no t yet approved or cleared by the [place] FDA and  has been authorized for detection and/or diagnosis of SARS-CoV-2 by FDA under an Emergency Use Authorization (EUA). This EUA will remain  in effect (meaning this test can be used) for the duration of the COVID-19 declaration under Section 564(b)(1) of the Act, 21 U.S.C.section 360bbb-3(b)(1), unless the authorization is terminated  or revoked sooner.       Influenza A by PCR NEGATIVE NEGATIVE Final   Influenza B by PCR NEGATIVE NEGATIVE Final    Comment: (NOTE) The Xpert Xpress SARS-CoV-2/FLU/RSV plus assay is intended as an aid in the diagnosis of influenza from Nasopharyngeal swab specimens and should not be used as a sole basis for treatment. Nasal washings and aspirates are unacceptable for Xpert Xpress SARS-CoV-2/FLU/RSV testing.  Fact Sheet for Patients: EntrepreneurPulse.com.au  Fact Sheet for Healthcare Providers: IncredibleEmployment.be  This test is not yet approved or cleared by the [place] FDA and has been authorized for detection and/or diagnosis of SARS-CoV-2 by FDA under an Emergency Use Authorization (EUA). This EUA will remain in effect (meaning this test can be used) for the duration of the COVID-19 declaration under Section 564(b)(1) of the Act, 21 U.S.C. section 360bbb-3(b)(1), unless the authorization is terminated or revoked.  Performed at [facility], [address] [place], [place] [address]   Body fluid culture w Gram Stain     Status: None (Preliminary result)   Collection Time: 03/01/21 10:01 AM   Specimen: PATH Cytology Pleural fluid  Result Value Ref Range Status   Specimen Description   Final     PLEURAL Performed at [facility], [address] [place], [place] [address]    Special Requests   Final    NONE Performed at [facility], [place], [place] [address]    Gram Stain NO WBC SEEN NO ORGANISMS SEEN   Final   Culture   Final    NO GROWTH < 24 HOURS Performed at [facility], [place] [address] [place],  [address]    Report Status PENDING  Incomplete    Coagulation Studies: No results for input(s): LABPROT, INR in the last 72 hours.  Urinalysis: No results for input(s): COLORURINE, LABSPEC, PHURINE, GLUCOSEU, HGBUR, BILIRUBINUR, KETONESUR, PROTEINUR, UROBILINOGEN, NITRITE, LEUKOCYTESUR in the last 72 hours.  Invalid input(s): APPERANCEUR    Imaging: DG Chest 2 View  Result Date: 02/28/2021 CLINICAL DATA:  Shortness of breath. EXAM: CHEST - 2 VIEW COMPARISON:  September 04, 2020. FINDINGS: Increased, now moderate to large left pleural effusion. Overlying left-sided opacities. Right lung is clear. No right-sided pleural effusion. No visible pneumothorax. Partially imaged right upper quadrant drain. Right axillary stent. Cardiac silhouette is partly obscured with visible portion similar to prior. Similar mild cardiomegaly. IMPRESSION: 1. Increased, now moderate to  large left pleural effusion. 2. Overlying left-sided opacities, most likely compressive atelectasis. Pneumonia or aspiration is not excluded. 3. Cardiomegaly. Electronically Signed   By: Margaretha Sheffield M.D.   On: 02/28/2021 14:23   PERIPHERAL VASCULAR CATHETERIZATION  Result Date: 03/01/2021 See surgical note for result.  [place] UPPER EXTREMITY ARTERIAL RIGHT LIMITED (GRAFT, SINGLE VESSEL)  Result Date: 02/28/2021 CLINICAL DATA:  Evaluate AV fistula. EXAM: Right UPPER EXTREMITY ARTERIAL DUPLEX SCAN TECHNIQUE: Gray-scale sonography as well as color Doppler and duplex ultrasound was performed to evaluate the arteries of the upper extremity. COMPARISON:  None. FINDINGS:  Ultrasound examination of the right brachial basilic arteriovenous fistula was performed with grayscale, color-flow, and spectral Doppler images. No flow is demonstrated within the AV fistula itself. Heterogeneous echogenic thrombus is suggested. Flow is demonstrated in the brachial artery with velocity measured at 28 centimeters/second. Arterial flow is demonstrated at the distal elbow with velocity measured at 12 centimeters/second. IMPRESSION: No flow is demonstrated in the arteriovenous fistula in the right upper extremity. Electronically Signed   By: Lucienne Capers M.D.   On: 02/28/2021 19:45   DG Chest Port 1 View  Result Date: 03/01/2021 CLINICAL DATA:  Status post left-sided thoracentesis EXAM: PORTABLE CHEST 1 VIEW COMPARISON:  02/28/2021 FINDINGS: Right axillary vascular stent. Numerous leads and wires project over the chest. Right upper quadrant percutaneous drain. Midline trachea. Mild cardiomegaly. Decrease in tiny left pleural effusion. No pneumothorax. Suspect mild pulmonary venous congestion. Improved left-sided aeration with areas of compressive atelectasis remaining. IMPRESSION: Decreased left-sided pleural effusion, without pneumothorax or other acute complication. Cardiomegaly with suspicion of mild pulmonary venous congestion. Electronically Signed   By: Abigail Miyamoto M.D.   On: 03/01/2021 10:48   ECHOCARDIOGRAM COMPLETE  Result Date: 03/01/2021    ECHOCARDIOGRAM REPORT   Patient Name:   SHERRARD AMY Date of Exam: 03/01/2021 Medical Rec #:  [id number]    Height:       64.0 in Accession #:    [id number]   Weight:       135.0 lb Date of Birth:  10/10/1970     BSA:          1.655 m Patient Age:    8 years     BP:           151/107 mmHg Patient Gender: M            HR:           86 bpm. Exam Location:  [facility] Procedure: 2D Echo, Color Doppler, Cardiac Doppler and Strain Analysis Indications:     Elevated troponin  History:         Patient has no prior history of Echocardiogram examinations.                   Stroke and ESRD; Risk Factors:Hypertension.  Sonographer:     Charmayne Sheer Referring Phys:  [id number] [place] T TU Diagnosing Phys: Nelva Bush MD  Sonographer Comments: Global longitudinal strain was attempted. IMPRESSIONS  1. Left ventricular ejection fraction, by estimation, is <20%. The left ventricle has severely decreased function. The left ventricle demonstrates global hypokinesis. Left ventricular diastolic parameters are consistent with Grade II diastolic dysfunction (pseudonormalization). Elevated left atrial pressure. The average left ventricular global longitudinal strain is -4.3 %. The global longitudinal strain is abnormal.  2. Right ventricular systolic function is moderately reduced. The right ventricular size is mildly enlarged. There is mildly elevated pulmonary artery systolic pressure.  3. Left atrial size was moderately dilated.  4. Right atrial size was mildly dilated.  5. The mitral valve is abnormal. Mild to moderate mitral valve regurgitation. No evidence of mitral stenosis.  6. Tricuspid valve regurgitation is moderate to severe.  7. The aortic valve is tricuspid. There is mild calcification of the aortic valve. Aortic valve regurgitation is not visualized. Mild aortic valve sclerosis is present, with no evidence of aortic valve stenosis.  8. The inferior vena cava is dilated in size with <50% respiratory variability, suggesting right atrial pressure of 15 mmHg. FINDINGS  Left Ventricle: Left ventricular ejection fraction, by estimation, is <20%. The left ventricle has severely decreased function. The left ventricle demonstrates global hypokinesis. The average left ventricular global longitudinal strain is -4.3 %. The global longitudinal strain is abnormal. The left ventricular internal cavity size was normal in size. There is no left ventricular hypertrophy. Left ventricular diastolic parameters are consistent with Grade II diastolic dysfunction (pseudonormalization).  Elevated left atrial pressure. Right Ventricle: The right ventricular size is mildly enlarged. No increase in right ventricular wall thickness. Right ventricular systolic function is moderately reduced. There is mildly elevated pulmonary artery systolic pressure. The tricuspid regurgitant velocity is 2.71 m/s, and with an assumed right atrial pressure of 15 mmHg, the estimated right ventricular systolic pressure is [id number] mmHg. Left Atrium: Left atrial size was moderately dilated. Right Atrium: Right atrial size was mildly dilated. Pericardium: There is no evidence of pericardial effusion. Mitral Valve: The mitral valve is abnormal. There is mild thickening of the mitral valve leaflet(s). Mild to moderate mitral valve regurgitation. No evidence of mitral valve stenosis. MV peak gradient, 3.4 mmHg. The mean mitral valve gradient is 2.0 mmHg. Tricuspid Valve: The tricuspid valve is normal in structure. Tricuspid valve regurgitation is moderate to severe. Aortic Valve: The aortic valve is tricuspid. There is mild calcification of the aortic valve. Aortic valve regurgitation is not visualized. Mild aortic valve sclerosis is present, with no evidence of aortic valve stenosis. Aortic valve mean gradient measures 2.0 mmHg. Aortic valve peak gradient measures 3.6 mmHg. Aortic valve area, by VTI measures 2.45 cm. Pulmonic Valve: The pulmonic valve was normal in structure. Pulmonic valve regurgitation is mild. No evidence of pulmonic stenosis. Aorta: The aortic root and ascending aorta are structurally normal, with no evidence of dilitation. Pulmonary Artery: The pulmonary artery is of normal size. Venous: The inferior vena cava is dilated in size with less than 50% respiratory variability, suggesting right atrial pressure of 15 mmHg. IAS/Shunts: No atrial level shunt detected by color flow Doppler.  LEFT VENTRICLE PLAX 2D LVIDd:         5.10 cm   Diastology LVIDs:         4.72 cm   LV e' medial:    3.92 cm/s LV PW:          1.04 cm   LV E/e' medial:  24.7 LV IVS:        0.73 cm   LV e' lateral:   6.53 cm/s LVOT diam:     2.20 cm   LV E/e' lateral: 14.8 LV SV:         38 LV SV Index:   23        2D Longitudinal Strain LVOT Area:     3.80 cm  2D Strain GLS Avg:     -4.3 %  RIGHT VENTRICLE RV Basal diam:  4.21 cm RV S prime:     8.70 cm/s LEFT ATRIUM  Index        RIGHT ATRIUM           Index LA diam:        4.10 cm 2.48 cm/m   RA Area:     16.60 cm LA Vol (A2C):   70.0 ml 42.29 ml/m  RA Volume:   44.60 ml  26.94 ml/m LA Vol (A4C):   54.6 ml 32.98 ml/m LA Biplane Vol: 62.0 ml 37.45 ml/m  AORTIC VALVE                    PULMONIC VALVE AV Area (Vmax):    2.91 cm     PV Vmax:          0.57 m/s AV Area (Vmean):   2.54 cm     PV Vmean:         47.400 cm/s AV Area (VTI):     2.45 cm     PV VTI:           0.097 m AV Vmax:           94.80 cm/s   PV Peak grad:     1.3 mmHg AV Vmean:          66.200 cm/s  PV Mean grad:     1.0 mmHg AV VTI:            0.154 m      PR End Diast Vel: 8.29 msec AV Peak Grad:      3.6 mmHg AV Mean Grad:      2.0 mmHg LVOT Vmax:         72.50 cm/s LVOT Vmean:        44.300 cm/s LVOT VTI:          0.099 m LVOT/AV VTI ratio: 0.64  AORTA Ao Root diam: 2.80 cm MITRAL VALVE               TRICUSPID VALVE MV Area (PHT): 5.66 cm    TR Peak grad:   29.4 mmHg MV Area VTI:   2.22 cm    TR Vmax:        271.00 cm/s MV Peak grad:  3.4 mmHg MV Mean grad:  2.0 mmHg    SHUNTS MV Vmax:       0.92 m/s    Systemic VTI:  0.10 m MV Vmean:      60.1 cm/s   Systemic Diam: 2.20 cm MV Decel Time: 134 msec MV E velocity: 96.80 cm/s MV A velocity: 52.70 cm/s MV E/A ratio:  1.84 Harrell Gave End MD Electronically signed by Nelva Bush MD Signature Date/Time: 03/01/2021/6:57:25 PM    Final    US THORACENTESIS ASP PLEURAL SPACE W/IMG GUIDE  Result Date: 03/01/2021 INDICATION: Left pleural effusion request received for diagnostic and therapeutic thoracentesis. EXAM: ULTRASOUND GUIDED LEFT THORACENTESIS MEDICATIONS: Local  1% lidocaine only. COMPLICATIONS: None immediate. PROCEDURE: An ultrasound guided thoracentesis was thoroughly discussed with the patient and questions answered. The benefits, risks, alternatives and complications were also discussed. The patient understands and wishes to proceed with the procedure. Written consent was obtained. Ultrasound was performed to localize and mark an adequate pocket of fluid in the left chest. The area was then prepped and draped in the normal sterile fashion. 1% Lidocaine was used for local anesthesia. Under ultrasound guidance a 6 Fr Safe-T-Centesis catheter was introduced. Thoracentesis was performed. The procedure was stopped early with small amount of remaining fluid seen given patient's excessive coughing. Vital  signs were monitored throughout the procedure and remained stable. The catheter was removed and a dressing applied. FINDINGS: A total of approximately 900 mL of clear yellow fluid was removed. Samples were sent to the laboratory as requested by the clinical team. IMPRESSION: Successful ultrasound guided left thoracentesis yielding 900 mL of pleural fluid. Read By: Tsosie Billing PA-C Electronically Signed   By: Aletta Edouard M.D.   On: 03/01/2021 10:34     Medications:      (feeding supplement) PROSource Plus  30 mL Oral BID BM   aspirin EC  81 mg Oral Daily   carvedilol  3.125 mg Oral BID WC   Chlorhexidine Gluconate Cloth  6 each Topical Q0600   feeding supplement  237 mL Oral Q24H   multivitamin  1 tablet Oral QHS   rosuvastatin  40 mg Oral Daily   hydrALAZINE, ondansetron (ZOFRAN) IV  Assessment/ Plan:  [name] is a 51 y.o.  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  He presents to the emergency room from dialysis with inability to access AV fistula.  Patient was sent for  evaluation of AVF (arteriovenous fistula) (HCC) [I77.0] SOB (shortness of breath) [R06.02] Pleural effusion [J90] ESRD (end stage renal disease) ([place]) [N18.6] Troponin I above reference range [R77.8] Arteriovenous fistula occlusion, initial encounter ([place]) [id number]   [facility] M[facility] Left AVF 49kg   End-stage renal disease on hemodialysis; will maintain outpatient schedule during admission if possible.  Last hemodialysis was on 02/25/2021.  Received dialysis last night after vascular procedure.  Dialysis performed through PermCath placed during procedure.  Patient and PermCath tolerated treatment well.  Plan to dialyze patient today on scheduled dialysis day, patient refused due to late treatment last night.  States he does not do well with treatments too close together.  Next scheduled treatment on Friday.  2. Anemia of chronic kidney disease  Lab Results  Component Value Date   HGB 10.8 (L) 03/02/2021  Hemoglobin above goal, no need for ESA's  3. Secondary Hyperparathyroidism:  Lab Results  Component Value Date   CALCIUM 8.0 (L) 03/02/2021   CAION 0.76 (LL) 08/07/2019   PHOS 6.1 (H) 03/02/2021    Calcium and phosphorus not at target. Renvela outpatient, will restart inpatient We will continue to monitor bone minerals  4.  Hypertension with chronic kidney disease.  Blood pressure improved 127/83.  Prescribed as needed IV hydralazine.   5.  Nonfunctioning dialysis access Appreciate vascular attempting to declot graft.  Mechanical thrombectomy of right arm AV graft unsuccessful.  Right IJ PermCath placed during procedure as HD access.    LOS: 2 Sohail Capraro 10/12/202212:40 PM

## 2021-03-02 NOTE — Plan of Care (Signed)

## 2021-03-02 NOTE — Consult Note (Signed)
Cardiology Consultation:   Patient ID: Larry Morrison MRN: [id number]; DOB: [birth year]  Admit date: 02/28/2021 Date of Consult: 03/02/2021  PCP:  [facility]  Cardiologist:  [facility], Dr. Garen Lah rounding Advanced Practice Provider:  No care team member to display Electrophysiologist:  None 6}    Patient Profile:   Larry Morrison is a 50 y.o. male with a hx of CVA, hypertension, ESRD on HD, chronic right nephrostomy drain, nephrolithiasis, neurogenic bladder, wheelchair-bound/sedentary lifestyle, anemia of chronic dz, and who is being seen today for the evaluation of reduced EF/ new HFrEF at the request of Dr. Billie Ruddy.  History of Present Illness:   Larry Morrison is a 50 yo male with PMH as above.  Unfortunately, the below history is limited due to residual deficits 2/2 and mostly obtained via chart biopsy.  He presented to [facility] 02/28/21 with SOB and volume overload after being unable to complete hemodialysis treatment due to clotting of his shunt.  He had last completed HD 02/25/2021.  After his missed treatment, he started to feel short of breath.  No chest pain.  No known history of heart disease or arrhythmia.  At presentation, he was found to be hypertensive.  Labs showed BNP greater than 4500, high-sensitivity troponin 55.  Chest x-ray showed moderate to large pleural effusion.  Echo obtained with reduced EF less than 20%, LV global hypokinesis, G2 DD, mild to moderate MR, moderate to severe TR, and findings as below.  EKG showed NSR, 98 bpm, baseline wander, significant artifact, LVH.  Nephrology restarted hemodialysis.  He is s/p thoracentesis with improvement in symptoms.  Cardiology consulted.   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57   Dialysis patient [facility])    Dyspnea    Foot drop    History of kidney stones    History of nephrostomy    Hypertension    Renal disorder    Stroke [facility])      Past Surgical History:  Procedure Laterality Date   A/V SHUNT INTERVENTION N/A 03/01/2021   Procedure: A/V SHUNT INTERVENTION;  Surgeon: Katha Cabal, MD;  Location: [facility];  Service: Cardiovascular;  Laterality: N/A;   AV FISTULA PLACEMENT Right 08/07/2019   Procedure: ARTERIOVENOUS (AV) FISTULA CREATION;  Surgeon: Algernon Huxley, MD;  Location: [facility] ORS;  Service: Vascular;  Laterality: Right;   AV FISTULA PLACEMENT Right 11/20/2019   Procedure: INSERTION OF ARTERIOVENOUS (AV) GORE-TEX GRAFT ARM;  Surgeon: Algernon Huxley, MD;  Location: [facility] ORS;  Service: Vascular;  Laterality: Right;   IR NEPHROSTOMY EXCHANGE RIGHT  09/10/2020   NEPHRECTOMY Left    NEPHRECTOMY     ORCHIECTOMY Right 10/27/2016   Procedure: PSB ORCHIECTOMY;  Surgeon: Hollice Espy, MD;  Location: [facility] ORS;  Service: Urology;  Laterality: Right;   ORCHIOPEXY Bilateral 10/27/2016   Procedure: ORCHIOPEXY ADULT;  Surgeon: Hollice Espy, MD;  Location: [facility] ORS;  Service: Urology;  Laterality: Bilateral;   SCROTAL EXPLORATION Bilateral 10/27/2016   Procedure: SCROTUM EXPLORATION;  Surgeon: Hollice Espy, MD;  Location: [facility] ORS;  Service: Urology;  Laterality: Bilateral;     Home Medications:  Prior to Admission medications   Not on File    Inpatient Medications: Scheduled Meds:  (feeding supplement) PROSource Plus  30 mL Oral BID BM   Chlorhexidine Gluconate Cloth  6 each Topical Q0600   feeding supplement  237 mL Oral  Q24H   multivitamin  1 tablet Oral QHS   Continuous Infusions:  PRN Meds: hydrALAZINE, ondansetron (ZOFRAN) IV  Allergies:    Allergies  Allergen Reactions   Vancomycin     Patient denies - repeated denial to pharm tech 09-05-2020    Social History:   Social History   Socioeconomic History   Marital status: Single    Spouse name: Not on file   Number of children: Not on file   Years of education: Not on file   Highest education level: Not on file  Occupational History    Not on file  Tobacco Use   Smoking status: Former    Packs/day: 0.50    Types: Cigarettes   Smokeless tobacco: Never  Vaping Use   Vaping Use: Never used  Substance and Sexual Activity   Alcohol use: No   Drug use: Yes    Types: Marijuana    Comment: today   Sexual activity: Not Currently  Other Topics Concern   Not on file  Social History Narrative   Not on file   Social Determinants of Health   Financial Resource Strain: Not on file  Food Insecurity: Not on file  Transportation Needs: Not on file  Physical Activity: Not on file  Stress: Not on file  Social Connections: Not on file  Intimate Partner Violence: Not on file    Family History:    Family History  Family history unknown: Yes     ROS:  Please see the history of present illness.  Review of Systems  Unable to perform ROS: Other  Respiratory:  Positive for shortness of breath.   Cardiovascular:  Negative for chest pain.   All other ROS reviewed and negative.    ROS difficult to obtain due to residual deficits from CVA Physical Exam/Data:   Vitals:   03/01/21 2145 03/01/21 2230 03/01/21 2312 03/02/21 0422  BP: 124/82 (!) 137/95 (!) 140/96 (!) 140/97  Pulse: 77 80 79 83  Resp: (!) 25 (!) 35 18 18  Temp:  (!) 97.5 F (36.4 C) 97.6 F (36.4 C) 98.2 F (36.8 C)  TempSrc:  Oral Oral Oral  SpO2:   100% 100%  Weight:      Height:        Intake/Output Summary (Last 24 hours) at 03/02/2021 0816 Last data filed at 03/02/2021 0438 Gross per 24 hour  Intake 237 ml  Output 1001 ml  Net -764 ml   Last 3 Weights 03/01/2021 02/28/2021 09/07/2020  Weight (lbs) 119 lb 14.9 oz 135 lb 116 lb 6.4 oz  Weight (kg) 54.4 kg 61.236 kg 52.799 kg     Body mass index is 20.59 kg/m.  General: Frail and cachectic male, no acute distress HEENT: normal Lymph: no adenopathy Neck: no JVD Vascular: No carotid bruits Cardiac:  normal S1, S2; RRR; 2/6 systolic murmur Lungs: Coarse breath sounds bilaterally, reduced  bibasilar breath sounds Abd: soft, nontender, no hepatomegaly  Ext: no edema, SCDs in place Musculoskeletal:  No deformities Skin: warm and dry  Neuro: Residual deficits from CVA Psych:  Normal affect   EKG:  The EKG was personally reviewed and demonstrates: EKG showed NSR, 98 bpm, baseline wander, significant artifact, LVH. Telemetry:  Telemetry was personally reviewed and demonstrates: Not on telemetry  Relevant CV Studies: Echo 03/02/21  1. Left ventricular ejection fraction, by estimation, is <20%. The left  ventricle has severely decreased function. The left ventricle demonstrates  global hypokinesis. Left ventricular diastolic parameters are consistent  with Grade II diastolic  dysfunction (pseudonormalization). Elevated left atrial pressure. The  average left ventricular global longitudinal strain is -4.3 %. The global  longitudinal strain is abnormal.   2. Right ventricular systolic function is moderately reduced. The right  ventricular size is mildly enlarged. There is mildly elevated pulmonary  artery systolic pressure.   3. Left atrial size was moderately dilated.   4. Right atrial size was mildly dilated.   5. The mitral valve is abnormal. Mild to moderate mitral valve  regurgitation. No evidence of mitral stenosis.   6. Tricuspid valve regurgitation is moderate to severe.   7. The aortic valve is tricuspid. There is mild calcification of the  aortic valve. Aortic valve regurgitation is not visualized. Mild aortic  valve sclerosis is present, with no evidence of aortic valve stenosis.   8. The inferior vena cava is dilated in size with <50% respiratory  variability, suggesting right atrial pressure of 15 mmHg.   Laboratory Data:  High Sensitivity Troponin:   Recent Labs  Lab 02/28/21 1456  TROPONINIHS 155*     Chemistry Recent Labs  Lab 02/28/21 1456 03/01/21 0852 03/02/21 0544  NA 141 142 135  K 4.4 4.6 4.0  CL 100 100 95*  CO2 '22 23 28  '$ GLUCOSE 91 77  89  BUN 63* 72* 37*  CREATININE 7.53* 8.06* 4.60*  CALCIUM 8.7* 8.6* 8.0*  GFRNONAA 8* 7* 15*  ANIONGAP 19* 19* 12    Recent Labs  Lab 02/28/21 1456 03/02/21 0544  PROT 8.7*  --   ALBUMIN 3.1* 2.4*  AST 24  --   ALT 16  --   ALKPHOS 112  --   BILITOT 2.1*  --    Hematology Recent Labs  Lab 02/28/21 1456  WBC 6.1  RBC 4.35  HGB 14.2  HCT 42.9  MCV 98.6  MCH 32.6  MCHC 33.1  RDW 18.7*  PLT 144*   BNP Recent Labs  Lab 02/28/21 1456  BNP >4,500.0*    DDimer No results for input(s): DDIMER in the last 168 hours.   Radiology/Studies:  DG Chest 2 View  Result Date: 02/28/2021 CLINICAL DATA:  Shortness of breath. EXAM: CHEST - 2 VIEW COMPARISON:  September 04, 2020. FINDINGS: Increased, now moderate to large left pleural effusion. Overlying left-sided opacities. Right lung is clear. No right-sided pleural effusion. No visible pneumothorax. Partially imaged right upper quadrant drain. Right axillary stent. Cardiac silhouette is partly obscured with visible portion similar to prior. Similar mild cardiomegaly. IMPRESSION: 1. Increased, now moderate to large left pleural effusion. 2. Overlying left-sided opacities, most likely compressive atelectasis. Pneumonia or aspiration is not excluded. 3. Cardiomegaly. Electronically Signed   By: Margaretha Sheffield M.D.   On: 02/28/2021 14:23   PERIPHERAL VASCULAR CATHETERIZATION  Result Date: 03/01/2021 See surgical note for result.  [place] UPPER EXTREMITY ARTERIAL RIGHT LIMITED (GRAFT, SINGLE VESSEL)  Result Date: 02/28/2021 CLINICAL DATA:  Evaluate AV fistula. EXAM: Right UPPER EXTREMITY ARTERIAL DUPLEX SCAN TECHNIQUE: Gray-scale sonography as well as color Doppler and duplex ultrasound was performed to evaluate the arteries of the upper extremity. COMPARISON:  None. FINDINGS: Ultrasound examination of the right brachial basilic arteriovenous fistula was performed with grayscale, color-flow, and spectral Doppler images. No flow is  demonstrated within the AV fistula itself. Heterogeneous echogenic thrombus is suggested. Flow is demonstrated in the brachial artery with velocity measured at 28 centimeters/second. Arterial flow is demonstrated at the distal elbow with velocity measured at 12 centimeters/second. IMPRESSION: No flow is  demonstrated in the arteriovenous fistula in the right upper extremity. Electronically Signed   By: Lucienne Capers M.D.   On: 02/28/2021 19:45   DG Chest Port 1 View  Result Date: 03/01/2021 CLINICAL DATA:  Status post left-sided thoracentesis EXAM: PORTABLE CHEST 1 VIEW COMPARISON:  02/28/2021 FINDINGS: Right axillary vascular stent. Numerous leads and wires project over the chest. Right upper quadrant percutaneous drain. Midline trachea. Mild cardiomegaly. Decrease in tiny left pleural effusion. No pneumothorax. Suspect mild pulmonary venous congestion. Improved left-sided aeration with areas of compressive atelectasis remaining. IMPRESSION: Decreased left-sided pleural effusion, without pneumothorax or other acute complication. Cardiomegaly with suspicion of mild pulmonary venous congestion. Electronically Signed   By: Abigail Miyamoto M.D.   On: 03/01/2021 10:48   ECHOCARDIOGRAM COMPLETE  Result Date: 03/01/2021    ECHOCARDIOGRAM REPORT   Patient Name:   AMIE SPAWN Date of Exam: 03/01/2021 Medical Rec #:  [id number]    Height:       64.0 in Accession #:    [id number]   Weight:       135.0 lb Date of Birth:  07-06-1970     BSA:          1.655 m Patient Age:    15 years     BP:           151/107 mmHg Patient Gender: M            HR:           86 bpm. Exam Location:  [facility] Procedure: 2D Echo, Color Doppler, Cardiac Doppler and Strain Analysis Indications:     Elevated troponin  History:         Patient has no prior history of Echocardiogram examinations.                  Stroke and ESRD; Risk Factors:Hypertension.  Sonographer:     Charmayne Sheer Referring Phys:  [id number] Glenvil T TU Diagnosing Phys: Nelva Bush MD  Sonographer Comments: Global longitudinal strain was attempted. IMPRESSIONS  1. Left ventricular ejection fraction, by estimation, is <20%. The left ventricle has severely decreased function. The left ventricle demonstrates global hypokinesis. Left ventricular diastolic parameters are consistent with Grade II diastolic dysfunction (pseudonormalization). Elevated left atrial pressure. The average left ventricular global longitudinal strain is -4.3 %. The global longitudinal strain is abnormal.  2. Right ventricular systolic function is moderately reduced. The right ventricular size is mildly enlarged. There is mildly elevated pulmonary artery systolic pressure.  3. Left atrial size was moderately dilated.  4. Right atrial size was mildly dilated.  5. The mitral valve is abnormal. Mild to moderate mitral valve regurgitation. No evidence of mitral stenosis.  6. Tricuspid valve regurgitation is moderate to severe.  7. The aortic valve is tricuspid. There is mild calcification of the aortic valve. Aortic valve regurgitation is not visualized. Mild aortic valve sclerosis is present, with no evidence of aortic valve stenosis.  8. The inferior vena cava is dilated in size with <50% respiratory variability, suggesting right atrial pressure of 15 mmHg. FINDINGS  Left Ventricle: Left ventricular ejection fraction, by estimation, is <20%. The left ventricle has severely decreased function. The left ventricle demonstrates global hypokinesis. The average left ventricular global longitudinal strain is -4.3 %. The global longitudinal strain is abnormal. The left ventricular internal cavity size was normal in size. There is no left ventricular hypertrophy. Left ventricular diastolic parameters are consistent with Grade II diastolic dysfunction (pseudonormalization). Elevated left atrial pressure. Right  Ventricle: The right ventricular size is mildly enlarged. No increase in right ventricular wall thickness. Right ventricular  systolic function is moderately reduced. There is mildly elevated pulmonary artery systolic pressure. The tricuspid regurgitant velocity is 2.71 m/s, and with an assumed right atrial pressure of 15 mmHg, the estimated right ventricular systolic pressure is [id number] mmHg. Left Atrium: Left atrial size was moderately dilated. Right Atrium: Right atrial size was mildly dilated. Pericardium: There is no evidence of pericardial effusion. Mitral Valve: The mitral valve is abnormal. There is mild thickening of the mitral valve leaflet(s). Mild to moderate mitral valve regurgitation. No evidence of mitral valve stenosis. MV peak gradient, 3.4 mmHg. The mean mitral valve gradient is 2.0 mmHg. Tricuspid Valve: The tricuspid valve is normal in structure. Tricuspid valve regurgitation is moderate to severe. Aortic Valve: The aortic valve is tricuspid. There is mild calcification of the aortic valve. Aortic valve regurgitation is not visualized. Mild aortic valve sclerosis is present, with no evidence of aortic valve stenosis. Aortic valve mean gradient measures 2.0 mmHg. Aortic valve peak gradient measures 3.6 mmHg. Aortic valve area, by VTI measures 2.45 cm. Pulmonic Valve: The pulmonic valve was normal in structure. Pulmonic valve regurgitation is mild. No evidence of pulmonic stenosis. Aorta: The aortic root and ascending aorta are structurally normal, with no evidence of dilitation. Pulmonary Artery: The pulmonary artery is of normal size. Venous: The inferior vena cava is dilated in size with less than 50% respiratory variability, suggesting right atrial pressure of 15 mmHg. IAS/Shunts: No atrial level shunt detected by color flow Doppler.  LEFT VENTRICLE PLAX 2D LVIDd:         5.10 cm   Diastology LVIDs:         4.72 cm   LV e' medial:    3.92 cm/s LV PW:         1.04 cm   LV E/e' medial:  24.7 LV IVS:        0.73 cm   LV e' lateral:   6.53 cm/s LVOT diam:     2.20 cm   LV E/e' lateral: 14.8 LV SV:         38 LV SV Index:    23        2D Longitudinal Strain LVOT Area:     3.80 cm  2D Strain GLS Avg:     -4.3 %  RIGHT VENTRICLE RV Basal diam:  4.21 cm RV S prime:     8.70 cm/s LEFT ATRIUM             Index        RIGHT ATRIUM           Index LA diam:        4.10 cm 2.48 cm/m   RA Area:     16.60 cm LA Vol (A2C):   70.0 ml 42.29 ml/m  RA Volume:   44.60 ml  26.94 ml/m LA Vol (A4C):   54.6 ml 32.98 ml/m LA Biplane Vol: 62.0 ml 37.45 ml/m  AORTIC VALVE                    PULMONIC VALVE AV Area (Vmax):    2.91 cm     PV Vmax:          0.57 m/s AV Area (Vmean):   2.54 cm     PV Vmean:         47.400 cm/s AV Area (VTI):     2.45  cm     PV VTI:           0.097 m AV Vmax:           94.80 cm/s   PV Peak grad:     1.3 mmHg AV Vmean:          66.200 cm/s  PV Mean grad:     1.0 mmHg AV VTI:            0.154 m      PR End Diast Vel: 8.29 msec AV Peak Grad:      3.6 mmHg AV Mean Grad:      2.0 mmHg LVOT Vmax:         72.50 cm/s LVOT Vmean:        44.300 cm/s LVOT VTI:          0.099 m LVOT/AV VTI ratio: 0.64  AORTA Ao Root diam: 2.80 cm MITRAL VALVE               TRICUSPID VALVE MV Area (PHT): 5.66 cm    TR Peak grad:   29.4 mmHg MV Area VTI:   2.22 cm    TR Vmax:        271.00 cm/s MV Peak grad:  3.4 mmHg MV Mean grad:  2.0 mmHg    SHUNTS MV Vmax:       0.92 m/s    Systemic VTI:  0.10 m MV Vmean:      60.1 cm/s   Systemic Diam: 2.20 cm MV Decel Time: 134 msec MV E velocity: 96.80 cm/s MV A velocity: 52.70 cm/s MV E/A ratio:  1.84 Harrell Gave End MD Electronically signed by Nelva Bush MD Signature Date/Time: 03/01/2021/6:57:25 PM    Final    US THORACENTESIS ASP PLEURAL SPACE W/IMG GUIDE  Result Date: 03/01/2021 INDICATION: Left pleural effusion request received for diagnostic and therapeutic thoracentesis. EXAM: ULTRASOUND GUIDED LEFT THORACENTESIS MEDICATIONS: Local 1% lidocaine only. COMPLICATIONS: None immediate. PROCEDURE: An ultrasound guided thoracentesis was thoroughly discussed with the patient and questions answered.  The benefits, risks, alternatives and complications were also discussed. The patient understands and wishes to proceed with the procedure. Written consent was obtained. Ultrasound was performed to localize and mark an adequate pocket of fluid in the left chest. The area was then prepped and draped in the normal sterile fashion. 1% Lidocaine was used for local anesthesia. Under ultrasound guidance a 6 Fr Safe-T-Centesis catheter was introduced. Thoracentesis was performed. The procedure was stopped early with small amount of remaining fluid seen given patient's excessive coughing. Vital signs were monitored throughout the procedure and remained stable. The catheter was removed and a dressing applied. FINDINGS: A total of approximately 900 mL of clear yellow fluid was removed. Samples were sent to the laboratory as requested by the clinical team. IMPRESSION: Successful ultrasound guided left thoracentesis yielding 900 mL of pleural fluid. Read By: Tsosie Billing PA-C Electronically Signed   By: Aletta Edouard M.D.   On: 03/01/2021 10:34     Assessment and Plan:   Acute on chronic HFrEF --Presented with SOB s/p missed HD.  BNP greater than 4500.  Echo EF less than 20% with no previous echo available.  S/p thoracentesis with improvement in symptoms.  Restarting hemodialysis. Volume management per nephrology /HD. Monitor I/os, daily weights.   CHF education. Optimize GDMT.  Start Coreg 3.125 mg twice daily. Add Entresto/SGLT2i/Spironolactone as tolerated. Further ischemic work-up once volume status optimized for reduced EF less than 20% with consideration  of comorbid conditions.  We will need to discuss with interventional team.  Elevated high-sensitivity troponin --No chest pain.  EKG with baseline wander and significant artifact with repeat EKG ordered.  High-sensitivity troponin 155 and not yet cycled.  Echo shows EF less than 20% with no previous echo.  Given his significant volume overload,  considered his elevated HS Tn due to supply demand mismatch. Given risk factors for CAD, cannot completely rule out cor insufficiency and further ischemic work-up for his reduced EF less than 20% once volume status optimized and with consideration of comorbid conditions.  Aggressive risk factor modification recommended at this time.  We will check LDL.  Repeat EKG Cycle HS Tn until peaked, downtrending.   Start ASA 81 mg qd. Start Coreg 3.125 mg BID. Start Crestor 40 mg daily Check baseline LDL/LFTs.   Repeat LDL/ALT in 6-8 weeks Further ischemic work-up with right and left heart catheterization to be discussed with interventional team/rounding team given comorbid conditions.  Essential hypertension --Suspect improvement with volume status.  As above, will optimize GDMT.  Goal BP less than 130/80. Continue HD. Start carvedilol 3.125 mg twice daily. Add Entresto/spironolactone if tolerated. For further BP support, could consider Imdur.  Defer for now.  ESRD / Anemia of chronic disease --Daily CBC. --HD per nephrology  For questions or updates, please contact [facility] Please consult [email or web address] for contact info under    Signed, Arvil Chaco, PA-C  03/02/2021 8:16 AM

## 2021-03-02 NOTE — Progress Notes (Signed)
PROGRESS NOTE    Casmere Mainer  [id number] DOB: [birth year] DOA: 02/28/2021 PCP: [facility]   Brief Narrative: Taken from H&P.  Marie Correa is a 50 y.o. 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 Last dialysis was on 10/7, Friday.  When he went yesterday for his dialysis they could not access his fistula and he was sent to ED for further management.  Vascular surgery is aware and will be taking to the OR later today followed by dialysis. Also found to have left-sided moderate to large pleural effusion and thoracentesis was ordered-initial labs appears transudate.  Subjective: Pt denied pain.  Reported eating ok.     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  Mechanical thrombectomy of right arm AV graft unsuccessful.   Right IJ PermCath placed during procedure as HD access.  Left-sided pleural effusion s/p thoracentesis, preliminary labs appears transudate. --fluid removal via HD  ESRD on HD MWF.   --iHD per nephrolgoy  Anemia with chronic kidney disease   Nephrolithiasis, neurogenic bladder Chronic right nephrostomy drain - Last exchange on 6/28 with IR at [facility]. Needs to follow up as he is due for a change every 3 months.  Cardiomyopathy, EF <20% Possible  CAD --cardiology consulted -Declined ischemic work-up, plans to follow-up at [facility] for any additional testing. --started on low-dose coreg and losartan per cardiology --started on ASA and statin --volume control with HD  Hypertension.   Pt reported taking no BP meds at home --started on low-dose coreg and losartan per cardiology  Hx of CVA -wheelchair bound --started on ASA and statin  Severe protein caloric malnutrition. Estimated body mass index is 20.59 kg/m as calculated from the following:   Height as of this encounter: '5\' 4"'$  (1.626 m).   Weight as of this encounter: 54.4 kg.  -supplements per Dietitian   Trop elevation 2/2 demand ischemia --trop 170's flat.   Objective: Vitals:   03/01/21 2312 03/02/21 0422 03/02/21 0822 03/02/21 1540  BP: (!) 140/96 (!) 140/97 127/83 125/78  Pulse: 79 83 86 86  Resp: '18 18 18 18  '$ Temp: 97.6 F (36.4 C) 98.2 F (36.8 C) 98.3 F (36.8 C) 98.9 F (37.2 C)  TempSrc: Oral Oral Oral Oral  SpO2: 100% 100% 98% 100%  Weight:      Height:        Intake/Output Summary (Last 24 hours) at 03/02/2021 1812 Last data filed at 03/02/2021 1404 Gross per 24 hour  Intake 597 ml  Output 1001 ml  Net -404 ml   Filed Weights   02/28/21 1411 03/01/21 1304  Weight: 61.2 kg 54.4 kg    Examination:  Constitutional: NAD, AAOx3 HEENT: conjunctivae and lids normal, EOMI CV: No cyanosis.   RESP: normal respiratory effort, on 2L SKIN: warm, dry Neuro: II - XII grossly intact.   Psych: Normal mood and affect.   Nephrotomy tube  from right outputting reddish urine.   DVT prophylaxis: Heparin Code Status: Full Family Communication:  Disposition Plan:  Status is: Inpatient  Dispo: The patient is from: Home              Anticipated d/c is to: Home              Patient currently is not medically stable to d/c.   Difficult to place patient No             Level of care: Med-Surg  All the records are reviewed and case discussed with Care  Management/Social Worker. Management plans discussed with the patient, nursing and they are in agreement.  Consultants:  Nephrology Vascular surgery  Procedures:  Antimicrobials:   Data Reviewed: I have personally reviewed following labs and imaging studies  CBC: Recent Labs  Lab 02/28/21 1456 03/02/21 0544  WBC 6.1 5.7  HGB 14.2 10.8*  HCT 42.9 32.2*  MCV 98.6 96.4  PLT 144* [id number]   Basic Metabolic Panel: Recent Labs  Lab 02/28/21 1456 03/01/21 0852 03/02/21 0544  NA 141 142 135  K 4.4 4.6 4.0  CL 100 100 95*  CO2 '22 23 28  '$ GLUCOSE 91 77 89  BUN 63* 72* 37*  CREATININE 7.53* 8.06* 4.60*  CALCIUM 8.7* 8.6* 8.0*  MG  --   --  1.5*  PHOS 9.8*  --  6.1*   GFR: Estimated Creatinine Clearance: 14.5 mL/min (A) (by C-G formula based on SCr of 4.6 mg/dL (H)). Liver Function Tests: Recent Labs  Lab 02/28/21 1456 03/02/21 0544  AST 24  --   ALT 16  --   ALKPHOS 112  --   BILITOT 2.1*  --   PROT 8.7*  --   ALBUMIN 3.1* 2.4*   No results for input(s): LIPASE, AMYLASE in the last 168 hours. No results for input(s): AMMONIA in the last 168 hours. Coagulation Profile: No results for input(s): INR, PROTIME in the last 168 hours. Cardiac Enzymes: No results for input(s): CKTOTAL, CKMB, CKMBINDEX, TROPONINI in the last 168 hours. BNP (last 3 results) No results for input(s): PROBNP in the last 8760 hours. HbA1C: No results for input(s): HGBA1C in the last 72 hours. CBG: No results for input(s): GLUCAP in the last 168 hours. Lipid Profile: Recent Labs    03/02/21 1050  CHOL 69  HDL 22*  LDLCALC 35  TRIG 62  CHOLHDL 3.1   Thyroid Function Tests: No results for input(s): TSH, T4TOTAL, FREET4, T3FREE, THYROIDAB in the last 72 hours. Anemia Panel: No results for input(s): VITAMINB12, FOLATE, FERRITIN, TIBC, IRON, RETICCTPCT in the last 72 hours. Sepsis Labs: Recent Labs  Lab 03/02/21 0544  PROCALCITON 3.03    Recent Results (from the past 240 hour(s))   Resp Panel by RT-PCR (Flu A&B, Covid) Nasopharyngeal Swab     Status: None   Collection Time: 02/28/21  6:32 PM   Specimen: Nasopharyngeal Swab; Nasopharyngeal(NP) swabs in vial transport medium  Result Value Ref Range Status   SARS Coronavirus 2 by RT PCR NEGATIVE NEGATIVE Final    Comment: (NOTE) SARS-CoV-2 target nucleic acids are NOT DETECTED.  The SARS-CoV-2 RNA is generally detectable in upper respiratory specimens during the acute phase of infection. The lowest concentration of SARS-CoV-2 viral copies this assay can detect is 138 copies/mL. A negative result does not preclude SARS-Cov-2 infection and should not be used as the sole basis for treatment or other patient management decisions. A negative result may occur with  improper specimen collection/handling, submission of specimen other than nasopharyngeal swab, presence of viral mutation(s) within the areas targeted by this assay, and inadequate number of viral copies(<138 copies/mL). A negative result must be combined with clinical observations, patient history, and epidemiological information. The expected result is Negative.  Fact Sheet for Patients:  EntrepreneurPulse.com.au  Fact Sheet for Healthcare Providers:  IncredibleEmployment.be  This test is no t yet approved or cleared by the [place] FDA and  has been authorized for detection and/or diagnosis of SARS-CoV-2 by FDA under an Emergency Use Authorization (EUA). This EUA will remain  in effect (meaning this test can be used) for the duration of the COVID-19 declaration under Section 564(b)(1) of the Act, 21 U.S.C.section 360bbb-3(b)(1), unless the authorization is terminated  or revoked sooner.       Influenza A by PCR NEGATIVE NEGATIVE Final   Influenza B by PCR NEGATIVE NEGATIVE Final    Comment: (NOTE) The Xpert Xpress SARS-CoV-2/FLU/RSV plus assay is intended as an aid in the diagnosis of influenza from  Nasopharyngeal swab specimens and should not be used as a sole basis for treatment. Nasal washings and aspirates are unacceptable for Xpert Xpress SARS-CoV-2/FLU/RSV testing.  Fact Sheet for Patients: EntrepreneurPulse.com.au  Fact Sheet for Healthcare Providers: IncredibleEmployment.be  This test is not yet approved or cleared by the [place] FDA and has been authorized for detection and/or diagnosis of SARS-CoV-2 by FDA under an Emergency Use Authorization (EUA). This EUA will remain in effect (meaning this test can be used) for the duration of the COVID-19 declaration under Section 564(b)(1) of the Act, 21 U.S.C. section 360bbb-3(b)(1), unless the authorization is terminated or revoked.  Performed at [facility], [address] [place], [place] [address]   Body fluid culture w Gram Stain     Status: None (Preliminary result)   Collection Time: 03/01/21 10:01 AM   Specimen: PATH Cytology Pleural fluid  Result Value Ref Range Status   Specimen Description   Final    PLEURAL Performed at [facility], [address], [place], [place] [address]    Special Requests   Final    NONE Performed at [facility], [place], [place] [address]    Gram Stain NO WBC SEEN NO ORGANISMS SEEN   Final   Culture   Final    NO GROWTH < 24 HOURS Performed at [facility], [place] [address], [place], [place] [address]    Report Status PENDING  Incomplete     Radiology Studies: PERIPHERAL VASCULAR CATHETERIZATION  Result Date: 03/01/2021 See surgical note for result.  [place] UPPER EXTREMITY ARTERIAL RIGHT LIMITED (GRAFT, SINGLE VESSEL)  Result Date: 02/28/2021 CLINICAL DATA:  Evaluate AV fistula. EXAM: Right UPPER EXTREMITY ARTERIAL DUPLEX SCAN TECHNIQUE: Gray-scale sonography as well as color Doppler and duplex ultrasound was performed to evaluate the arteries of the upper extremity. COMPARISON:   None. FINDINGS: Ultrasound examination of the right brachial basilic arteriovenous fistula was performed with grayscale, color-flow, and spectral Doppler images. No flow is demonstrated within the AV fistula itself. Heterogeneous echogenic thrombus is suggested. Flow is demonstrated in the brachial artery with velocity measured at 28 centimeters/second. Arterial flow is demonstrated at the distal elbow with velocity measured at 12 centimeters/second. IMPRESSION: No flow is demonstrated in the arteriovenous fistula in the right upper extremity. Electronically Signed   By: Lucienne Capers M.D.   On: 02/28/2021 19:45   DG Chest Port 1 View  Result Date: 03/01/2021 CLINICAL  DATA:  Status post left-sided thoracentesis EXAM: PORTABLE CHEST 1 VIEW COMPARISON:  02/28/2021 FINDINGS: Right axillary vascular stent. Numerous leads and wires project over the chest. Right upper quadrant percutaneous drain. Midline trachea. Mild cardiomegaly. Decrease in tiny left pleural effusion. No pneumothorax. Suspect mild pulmonary venous congestion. Improved left-sided aeration with areas of compressive atelectasis remaining. IMPRESSION: Decreased left-sided pleural effusion, without pneumothorax or other acute complication. Cardiomegaly with suspicion of mild pulmonary venous congestion. Electronically Signed   By: Abigail Miyamoto M.D.   On: 03/01/2021 10:48   ECHOCARDIOGRAM COMPLETE  Result Date: 03/01/2021    ECHOCARDIOGRAM REPORT   Patient Name:   ZACKRY ZEPHIR Date of Exam: 03/01/2021 Medical Rec #:  [id number]    Height:       64.0 in Accession #:    [id number]   Weight:       135.0 lb Date of Birth:  08/12/1970     BSA:          1.655 m Patient Age:    34 years     BP:           151/107 mmHg Patient Gender: M            HR:           86 bpm. Exam Location:  [facility] Procedure: 2D Echo, Color Doppler, Cardiac Doppler and Strain Analysis Indications:     Elevated troponin  History:         Patient has no prior history of  Echocardiogram examinations.                  Stroke and ESRD; Risk Factors:Hypertension.  Sonographer:     Charmayne Sheer Referring Phys:  [id number] [place] [facility] Diagnosing Phys: Nelva Bush MD  Sonographer Comments: Global longitudinal strain was attempted. IMPRESSIONS  1. Left ventricular ejection fraction, by estimation, is <20%. The left ventricle has severely decreased function. The left ventricle demonstrates global hypokinesis. Left ventricular diastolic parameters are consistent with Grade II diastolic dysfunction (pseudonormalization). Elevated left atrial pressure. The average left ventricular global longitudinal strain is -4.3 %. The global longitudinal strain is abnormal.  2. Right ventricular systolic function is moderately reduced. The right ventricular size is mildly enlarged. There is mildly elevated pulmonary artery systolic pressure.  3. Left atrial size was moderately dilated.  4. Right atrial size was mildly dilated.  5. The mitral valve is abnormal. Mild to moderate mitral valve regurgitation. No evidence of mitral stenosis.  6. Tricuspid valve regurgitation is moderate to severe.  7. The aortic valve is tricuspid. There is mild calcification of the aortic valve. Aortic valve regurgitation is not visualized. Mild aortic valve sclerosis is present, with no evidence of aortic valve stenosis.  8. The inferior vena cava is dilated in size with <50% respiratory variability, suggesting right atrial pressure of 15 mmHg. FINDINGS  Left Ventricle: Left ventricular ejection fraction, by estimation, is <20%. The left ventricle has severely decreased function. The left ventricle demonstrates global hypokinesis. The average left ventricular global longitudinal strain is -4.3 %. The global longitudinal strain is abnormal. The left ventricular internal cavity size was normal in size. There is no left ventricular hypertrophy. Left ventricular diastolic parameters are consistent with Grade II diastolic dysfunction  (pseudonormalization). Elevated left atrial pressure. Right Ventricle: The right ventricular size is mildly enlarged. No increase in right ventricular wall thickness. Right ventricular systolic function is moderately reduced. There is mildly elevated pulmonary artery systolic pressure. The tricuspid regurgitant velocity is  2.71 m/s, and with an assumed right atrial pressure of 15 mmHg, the estimated right ventricular systolic pressure is [id number] mmHg. Left Atrium: Left atrial size was moderately dilated. Right Atrium: Right atrial size was mildly dilated. Pericardium: There is no evidence of pericardial effusion. Mitral Valve: The mitral valve is abnormal. There is mild thickening of the mitral valve leaflet(s). Mild to moderate mitral valve regurgitation. No evidence of mitral valve stenosis. MV peak gradient, 3.4 mmHg. The mean mitral valve gradient is 2.0 mmHg. Tricuspid Valve: The tricuspid valve is normal in structure. Tricuspid valve regurgitation is moderate to severe. Aortic Valve: The aortic valve is tricuspid. There is mild calcification of the aortic valve. Aortic valve regurgitation is not visualized. Mild aortic valve sclerosis is present, with no evidence of aortic valve stenosis. Aortic valve mean gradient measures 2.0 mmHg. Aortic valve peak gradient measures 3.6 mmHg. Aortic valve area, by VTI measures 2.45 cm. Pulmonic Valve: The pulmonic valve was normal in structure. Pulmonic valve regurgitation is mild. No evidence of pulmonic stenosis. Aorta: The aortic root and ascending aorta are structurally normal, with no evidence of dilitation. Pulmonary Artery: The pulmonary artery is of normal size. Venous: The inferior vena cava is dilated in size with less than 50% respiratory variability, suggesting right atrial pressure of 15 mmHg. IAS/Shunts: No atrial level shunt detected by color flow Doppler.  LEFT VENTRICLE PLAX 2D LVIDd:         5.10 cm   Diastology LVIDs:         4.72 cm   LV e' medial:    3.92  cm/s LV PW:         1.04 cm   LV E/e' medial:  24.7 LV IVS:        0.73 cm   LV e' lateral:   6.53 cm/s LVOT diam:     2.20 cm   LV E/e' lateral: 14.8 LV SV:         38 LV SV Index:   23        2D Longitudinal Strain LVOT Area:     3.80 cm  2D Strain GLS Avg:     -4.3 %  RIGHT VENTRICLE RV Basal diam:  4.21 cm RV S prime:     8.70 cm/s LEFT ATRIUM             Index        RIGHT ATRIUM           Index LA diam:        4.10 cm 2.48 cm/m   RA Area:     16.60 cm LA Vol (A2C):   70.0 ml 42.29 ml/m  RA Volume:   44.60 ml  26.94 ml/m LA Vol (A4C):   54.6 ml 32.98 ml/m LA Biplane Vol: 62.0 ml 37.45 ml/m  AORTIC VALVE                    PULMONIC VALVE AV Area (Vmax):    2.91 cm     PV Vmax:          0.57 m/s AV Area (Vmean):   2.54 cm     PV Vmean:         47.400 cm/s AV Area (VTI):     2.45 cm     PV VTI:           0.097 m AV Vmax:           94.80 cm/s   PV  Peak grad:     1.3 mmHg AV Vmean:          66.200 cm/s  PV Mean grad:     1.0 mmHg AV VTI:            0.154 m      PR End Diast Vel: 8.29 msec AV Peak Grad:      3.6 mmHg AV Mean Grad:      2.0 mmHg LVOT Vmax:         72.50 cm/s LVOT Vmean:        44.300 cm/s LVOT VTI:          0.099 m LVOT/AV VTI ratio: 0.64  AORTA Ao Root diam: 2.80 cm MITRAL VALVE               TRICUSPID VALVE MV Area (PHT): 5.66 cm    TR Peak grad:   29.4 mmHg MV Area VTI:   2.22 cm    TR Vmax:        271.00 cm/s MV Peak grad:  3.4 mmHg MV Mean grad:  2.0 mmHg    SHUNTS MV Vmax:       0.92 m/s    Systemic VTI:  0.10 m MV Vmean:      60.1 cm/s   Systemic Diam: 2.20 cm MV Decel Time: 134 msec MV E velocity: 96.80 cm/s MV A velocity: 52.70 cm/s MV E/A ratio:  1.84 Harrell Gave End MD Electronically signed by Nelva Bush MD Signature Date/Time: 03/01/2021/6:57:25 PM    Final    US THORACENTESIS ASP PLEURAL SPACE W/IMG GUIDE  Result Date: 03/01/2021 INDICATION: Left pleural effusion request received for diagnostic and therapeutic thoracentesis. EXAM: ULTRASOUND GUIDED LEFT THORACENTESIS  MEDICATIONS: Local 1% lidocaine only. COMPLICATIONS: None immediate. PROCEDURE: An ultrasound guided thoracentesis was thoroughly discussed with the patient and questions answered. The benefits, risks, alternatives and complications were also discussed. The patient understands and wishes to proceed with the procedure. Written consent was obtained. Ultrasound was performed to localize and mark an adequate pocket of fluid in the left chest. The area was then prepped and draped in the normal sterile fashion. 1% Lidocaine was used for local anesthesia. Under ultrasound guidance a 6 Fr Safe-T-Centesis catheter was introduced. Thoracentesis was performed. The procedure was stopped early with small amount of remaining fluid seen given patient's excessive coughing. Vital signs were monitored throughout the procedure and remained stable. The catheter was removed and a dressing applied. FINDINGS: A total of approximately 900 mL of clear yellow fluid was removed. Samples were sent to the laboratory as requested by the clinical team. IMPRESSION: Successful ultrasound guided left thoracentesis yielding 900 mL of pleural fluid. Read By: Tsosie Billing PA-C Electronically Signed   By: Aletta Edouard M.D.   On: 03/01/2021 10:34    Scheduled Meds:  (feeding supplement) PROSource Plus  30 mL Oral BID BM   aspirin EC  81 mg Oral Daily   carvedilol  3.125 mg Oral BID WC   Chlorhexidine Gluconate Cloth  6 each Topical Q0600   feeding supplement  237 mL Oral Q24H   losartan  25 mg Oral Daily   multivitamin  1 tablet Oral QHS   rosuvastatin  40 mg Oral Daily   sevelamer carbonate  800 mg Oral TID WC   Continuous Infusions:     LOS: 2 days    Enzo Bi, MD [facility]  If 7PM-7AM, please contact night-coverage [email or web address]  03/02/2021, 6:12 PM

## 2021-03-03 LAB — BASIC METABOLIC PANEL
Anion gap: 11 (ref 5–15)
BUN: 59 mg/dL — ABNORMAL HIGH (ref 6–20)
CO2: 29 mmol/L (ref 22–32)
Calcium: 7.4 mg/dL — ABNORMAL LOW (ref 8.9–10.3)
Chloride: 98 mmol/L (ref 98–111)
Creatinine, Ser: 6.37 mg/dL — ABNORMAL HIGH (ref 0.61–1.24)
GFR, Estimated: 10 mL/min — ABNORMAL LOW (ref >60)
Glucose, Bld: 99 mg/dL (ref 70–99)
Potassium: 4 mmol/L (ref 3.5–5.1)
Sodium: 138 mmol/L (ref 135–145)

## 2021-03-03 LAB — CBC
HCT: 29.1 % — ABNORMAL LOW (ref 39.0–52.0)
Hemoglobin: 9.7 g/dL — ABNORMAL LOW (ref 13.0–17.0)
MCH: 32.7 pg (ref 26.0–34.0)
MCHC: 33.3 g/dL (ref 30.0–36.0)
MCV: 98 fL (ref 80.0–100.0)
Platelets: 129 10*3/uL — ABNORMAL LOW (ref 150–400)
RBC: 2.97 MIL/uL — ABNORMAL LOW (ref 4.22–5.81)
RDW: 17.7 % — ABNORMAL HIGH (ref 11.5–15.5)
WBC: 6.5 10*3/uL (ref 4.0–10.5)
nRBC: 0 % (ref 0.0–0.2)

## 2021-03-03 LAB — HEPATITIS B SURFACE ANTIBODY, QUANTITATIVE: Hep B S AB Quant (Post): <3.1 m[IU]/mL — ABNORMAL LOW (ref >9.9)

## 2021-03-03 LAB — MAGNESIUM: Magnesium: 1.7 mg/dL (ref 1.7–2.4)

## 2021-03-03 MED ORDER — CARVEDILOL 3.125 MG PO TABS: 3.1250 mg | ORAL_TABLET | Freq: Two times a day (BID) | ORAL | 0 refills | Status: DC

## 2021-03-03 MED ORDER — SEVELAMER CARBONATE 800 MG PO TABS: 800.0000 mg | ORAL_TABLET | Freq: Three times a day (TID) | ORAL | 0 refills | Status: DC

## 2021-03-03 MED ORDER — ENSURE ENLIVE PO LIQD: 237.0000 mL | ORAL | 12 refills | Status: DC

## 2021-03-03 MED ORDER — ASPIRIN 81 MG PO TBEC: 81.0000 mg | DELAYED_RELEASE_TABLET | Freq: Every day | ORAL | Status: DC

## 2021-03-03 MED ORDER — LOSARTAN POTASSIUM 25 MG PO TABS: 25.0000 mg | ORAL_TABLET | Freq: Every day | ORAL | 0 refills | Status: DC

## 2021-03-03 MED ORDER — RENA-VITE PO TABS
1.0000 | ORAL_TABLET | Freq: Every day | ORAL | 0 refills | Status: DC
Start: 2021-03-03 — End: 2021-04-21

## 2021-03-03 NOTE — Discharge Summary (Signed)
Physician Discharge Summary   Larry Morrison  male DOB: 1970/06/30  [id number]  PCP: [facility] [place] date: 02/28/2021 Discharge date: 03/03/2021  Admitted From: home Disposition:  home CODE STATUS: Full code  Discharge Instructions     Discharge instructions   Complete by: As directed    You have severe congestive heart failure.  Our cardiologist has started you on some heart medications.  Please establish with an outpatient cardiologist at [facility] as per your preference.    Please resume dialysis tomorrow 10/14 at your outpatient dialysis center.   Dr. Enzo Bi [facility] Course:  For full details, please see H&P, progress notes, consult notes and ancillary notes.  Briefly,  Larry Morrison is a 50 y.o. 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  Right AV fistula malfunctioning.  Mechanical thrombectomy of right arm AV graft was unsuccessful.   Right IJ PermCath placed during procedure as HD access.   Left-sided pleural effusion s/p thoracentesis.  preliminary labs appears transudate. --fluid removal via HD   ESRD on HD MWF.   --iHD per nephrolgoy --started on Renvela   Anemia with chronic kidney disease   Nephrolithiasis, neurogenic bladder Chronic right nephrostomy drain - Last exchange on 6/28 with IR [facility]. Needs to follow up as he is due for a change every 3 months.   Cardiomyopathy, EF <20% Possible CAD --Pt was not on any prescription medication at home prior to presentation. --cardiology consulted, and per cardiology, pt declined ischemic work-up, plans to follow-up at [facility] for any additional testing. --started on low-dose coreg and losartan per  cardiology --started on ASA.   --did not prescribe statin since LDL is already low at 35. --volume control with HD   Hypertension.   Pt reported taking no BP meds at home --started on low-dose coreg and losartan per cardiology   Hx of CVA -wheelchair bound --started on ASA  --did not prescribe statin since LDL is already low at 35.   Severe protein caloric malnutrition. Estimated body mass index is 20.59 kg/m as calculated from the following:   Height as of this encounter: '5\' 4"'$  (1.626 m).   Weight as of this encounter: 54.4 kg.  -supplements per Dietitian    Trop elevation 2/2 demand ischemia --trop 170's flat.    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 The score is based on dignosis, age, lab data, medications, orders, and past utilization.   Low:  0-14.9   Medium: 15-21.9   High: 22-29.9  Extreme: 30 and above         Discharge Instructions:  Allergies as of 03/03/2021       Reactions   Vancomycin    Patient denies - repeated denial to pharm tech 09-05-2020        Medication List     TAKE these medications    aspirin 81 MG EC tablet Take 1 tablet (81 mg total) by mouth daily. Swallow whole. Start taking on: March 04, 2021    carvedilol 3.125 MG tablet Commonly known as: COREG Take 1 tablet (3.125 mg total) by mouth 2 (two) times daily with a meal.   feeding supplement Liqd Take 237 mLs by mouth daily.   losartan 25 MG tablet Commonly known as: COZAAR Take 1 tablet (25 mg total) by mouth daily. Start taking on: March 04, 2021   multivitamin Tabs tablet Take 1 tablet by mouth at bedtime.   sevelamer carbonate 800 MG tablet Commonly known as: RENVELA Take 1 tablet (800 mg total) by mouth 3 (three) times daily with meals.         Follow-up Information     [facility] [place] Follow up in 1 week(s).   Contact information: [place] [address] [phone]                 Allergies  Allergen Reactions   Vancomycin     Patient denies - repeated denial to pharm tech 09-05-2020     The results of significant diagnostics from this hospitalization (including imaging, microbiology, ancillary and laboratory) are listed below for reference.   Consultations:   Procedures/Studies: DG Chest 2 View  Result Date: 02/28/2021 CLINICAL DATA:  Shortness of breath. EXAM: CHEST - 2 VIEW COMPARISON:  September 04, 2020. FINDINGS: Increased, now moderate to large left pleural effusion. Overlying left-sided opacities. Right lung is clear. No right-sided pleural effusion. No visible pneumothorax. Partially imaged right upper quadrant drain. Right axillary stent. Cardiac silhouette is partly obscured with visible portion similar to prior. Similar mild cardiomegaly. IMPRESSION: 1. Increased, now moderate to large left pleural effusion. 2. Overlying left-sided opacities, most likely compressive atelectasis. Pneumonia or aspiration is not excluded. 3. Cardiomegaly. Electronically Signed   By: Margaretha Sheffield M.D.   On: 02/28/2021 14:23   PERIPHERAL VASCULAR CATHETERIZATION  Result Date: 03/01/2021 See surgical note for result.  [place] UPPER EXTREMITY ARTERIAL RIGHT LIMITED (GRAFT,  SINGLE VESSEL)  Result Date: 02/28/2021 CLINICAL DATA:  Evaluate AV fistula. EXAM: Right UPPER EXTREMITY ARTERIAL DUPLEX SCAN TECHNIQUE: Gray-scale sonography as well as color Doppler and duplex ultrasound was performed to evaluate the arteries of the upper extremity. COMPARISON:  None. FINDINGS: Ultrasound examination of the right brachial basilic arteriovenous fistula was performed with grayscale, color-flow, and spectral Doppler images. No flow is demonstrated within the AV fistula itself. Heterogeneous echogenic thrombus is suggested. Flow is demonstrated in the brachial artery with velocity measured at 28 centimeters/second. Arterial flow is demonstrated at the distal elbow with velocity measured at 12 centimeters/second. IMPRESSION: No flow is demonstrated in the arteriovenous fistula in the right upper extremity. Electronically Signed   By: Lucienne Capers M.D.   On: 02/28/2021 19:45   DG Chest Port 1 View  Result Date: 03/01/2021 CLINICAL DATA:  Status post left-sided thoracentesis EXAM: PORTABLE CHEST 1 VIEW COMPARISON:  02/28/2021 FINDINGS: Right axillary vascular stent. Numerous leads and wires project over the chest. Right upper quadrant percutaneous drain. Midline trachea. Mild cardiomegaly. Decrease in tiny left pleural effusion. No pneumothorax.  Suspect mild pulmonary venous congestion. Improved left-sided aeration with areas of compressive atelectasis remaining. IMPRESSION: Decreased left-sided pleural effusion, without pneumothorax or other acute complication. Cardiomegaly with suspicion of mild pulmonary venous congestion. Electronically Signed   By: Abigail Miyamoto M.D.   On: 03/01/2021 10:48   ECHOCARDIOGRAM COMPLETE  Result Date: 03/01/2021    ECHOCARDIOGRAM REPORT   Patient Name:   Larry Morrison Date of Exam: 03/01/2021 Medical Rec #:  [id number]    Height:       64.0 in Accession #:    [id number]   Weight:       135.0 lb Date of Birth:  1970-09-08     BSA:          1.655 m Patient Age:     8 years     BP:           151/107 mmHg Patient Gender: M            HR:           86 bpm. Exam Location:  [facility] Procedure: 2D Echo, Color Doppler, Cardiac Doppler and Strain Analysis Indications:     Elevated troponin  History:         Patient has no prior history of Echocardiogram examinations.                  Stroke and ESRD; Risk Factors:Hypertension.  Sonographer:     Charmayne Sheer Referring Phys:  [id number] [place] T TU Diagnosing Phys: Nelva Bush MD  Sonographer Comments: Global longitudinal strain was attempted. IMPRESSIONS  1. Left ventricular ejection fraction, by estimation, is <20%. The left ventricle has severely decreased function. The left ventricle demonstrates global hypokinesis. Left ventricular diastolic parameters are consistent with Grade II diastolic dysfunction (pseudonormalization). Elevated left atrial pressure. The average left ventricular global longitudinal strain is -4.3 %. The global longitudinal strain is abnormal.  2. Right ventricular systolic function is moderately reduced. The right ventricular size is mildly enlarged. There is mildly elevated pulmonary artery systolic pressure.  3. Left atrial size was moderately dilated.  4. Right atrial size was mildly dilated.  5. The mitral valve is abnormal. Mild to moderate mitral valve regurgitation. No evidence of mitral stenosis.  6. Tricuspid valve regurgitation is moderate to severe.  7. The aortic valve is tricuspid. There is mild calcification of the aortic valve. Aortic valve regurgitation is not visualized. Mild aortic valve sclerosis is present, with no evidence of aortic valve stenosis.  8. The inferior vena cava is dilated in size with <50% respiratory variability, suggesting right atrial pressure of 15 mmHg. FINDINGS  Left Ventricle: Left ventricular ejection fraction, by estimation, is <20%. The left ventricle has severely decreased function. The left ventricle demonstrates global hypokinesis. The average left ventricular  global longitudinal strain is -4.3 %. The global longitudinal strain is abnormal. The left ventricular internal cavity size was normal in size. There is no left ventricular hypertrophy. Left ventricular diastolic parameters are consistent with Grade II diastolic dysfunction (pseudonormalization). Elevated left atrial pressure. Right Ventricle: The right ventricular size is mildly enlarged. No increase in right ventricular wall thickness. Right ventricular systolic function is moderately reduced. There is mildly elevated pulmonary artery systolic pressure. The tricuspid regurgitant velocity is 2.71 m/s, and with an assumed right atrial pressure of 15 mmHg, the estimated right ventricular systolic pressure is [id number] mmHg. Left Atrium: Left atrial size was moderately dilated. Right Atrium: Right atrial size was mildly dilated. Pericardium: There is no evidence of pericardial  effusion. Mitral Valve: The mitral valve is abnormal. There is mild thickening of the mitral valve leaflet(s). Mild to moderate mitral valve regurgitation. No evidence of mitral valve stenosis. MV peak gradient, 3.4 mmHg. The mean mitral valve gradient is 2.0 mmHg. Tricuspid Valve: The tricuspid valve is normal in structure. Tricuspid valve regurgitation is moderate to severe. Aortic Valve: The aortic valve is tricuspid. There is mild calcification of the aortic valve. Aortic valve regurgitation is not visualized. Mild aortic valve sclerosis is present, with no evidence of aortic valve stenosis. Aortic valve mean gradient measures 2.0 mmHg. Aortic valve peak gradient measures 3.6 mmHg. Aortic valve area, by VTI measures 2.45 cm. Pulmonic Valve: The pulmonic valve was normal in structure. Pulmonic valve regurgitation is mild. No evidence of pulmonic stenosis. Aorta: The aortic root and ascending aorta are structurally normal, with no evidence of dilitation. Pulmonary Artery: The pulmonary artery is of normal size. Venous: The inferior vena cava is  dilated in size with less than 50% respiratory variability, suggesting right atrial pressure of 15 mmHg. IAS/Shunts: No atrial level shunt detected by color flow Doppler.  LEFT VENTRICLE PLAX 2D LVIDd:         5.10 cm   Diastology LVIDs:         4.72 cm   LV e' medial:    3.92 cm/s LV PW:         1.04 cm   LV E/e' medial:  24.7 LV IVS:        0.73 cm   LV e' lateral:   6.53 cm/s LVOT diam:     2.20 cm   LV E/e' lateral: 14.8 LV SV:         38 LV SV Index:   23        2D Longitudinal Strain LVOT Area:     3.80 cm  2D Strain GLS Avg:     -4.3 %  RIGHT VENTRICLE RV Basal diam:  4.21 cm RV S prime:     8.70 cm/s LEFT ATRIUM             Index        RIGHT ATRIUM           Index LA diam:        4.10 cm 2.48 cm/m   RA Area:     16.60 cm LA Vol (A2C):   70.0 ml 42.29 ml/m  RA Volume:   44.60 ml  26.94 ml/m LA Vol (A4C):   54.6 ml 32.98 ml/m LA Biplane Vol: 62.0 ml 37.45 ml/m  AORTIC VALVE                    PULMONIC VALVE AV Area (Vmax):    2.91 cm     PV Vmax:          0.57 m/s AV Area (Vmean):   2.54 cm     PV Vmean:         47.400 cm/s AV Area (VTI):     2.45 cm     PV VTI:           0.097 m AV Vmax:           94.80 cm/s   PV Peak grad:     1.3 mmHg AV Vmean:          66.200 cm/s  PV Mean grad:     1.0 mmHg AV VTI:  0.154 m      PR End Diast Vel: 8.29 msec AV Peak Grad:      3.6 mmHg AV Mean Grad:      2.0 mmHg LVOT Vmax:         72.50 cm/s LVOT Vmean:        44.300 cm/s LVOT VTI:          0.099 m LVOT/AV VTI ratio: 0.64  AORTA Ao Root diam: 2.80 cm MITRAL VALVE               TRICUSPID VALVE MV Area (PHT): 5.66 cm    TR Peak grad:   29.4 mmHg MV Area VTI:   2.22 cm    TR Vmax:        271.00 cm/s MV Peak grad:  3.4 mmHg MV Mean grad:  2.0 mmHg    SHUNTS MV Vmax:       0.92 m/s    Systemic VTI:  0.10 m MV Vmean:      60.1 cm/s   Systemic Diam: 2.20 cm MV Decel Time: 134 msec MV E velocity: 96.80 cm/s MV A velocity: 52.70 cm/s MV E/A ratio:  1.84 Harrell Gave End MD Electronically signed by Nelva Bush MD Signature Date/Time: 03/01/2021/6:57:25 PM    Final    US THORACENTESIS ASP PLEURAL SPACE W/IMG GUIDE  Result Date: 03/01/2021 INDICATION: Left pleural effusion request received for diagnostic and therapeutic thoracentesis. EXAM: ULTRASOUND GUIDED LEFT THORACENTESIS MEDICATIONS: Local 1% lidocaine only. COMPLICATIONS: None immediate. PROCEDURE: An ultrasound guided thoracentesis was thoroughly discussed with the patient and questions answered. The benefits, risks, alternatives and complications were also discussed. The patient understands and wishes to proceed with the procedure. Written consent was obtained. Ultrasound was performed to localize and mark an adequate pocket of fluid in the left chest. The area was then prepped and draped in the normal sterile fashion. 1% Lidocaine was used for local anesthesia. Under ultrasound guidance a 6 Fr Safe-T-Centesis catheter was introduced. Thoracentesis was performed. The procedure was stopped early with small amount of remaining fluid seen given patient's excessive coughing. Vital signs were monitored throughout the procedure and remained stable. The catheter was removed and a dressing applied. FINDINGS: A total of approximately 900 mL of clear yellow fluid was removed. Samples were sent to the laboratory as requested by the clinical team. IMPRESSION: Successful ultrasound guided left thoracentesis yielding 900 mL of pleural fluid. Read By: Tsosie Billing PA-C Electronically Signed   By: Aletta Edouard M.D.   On: 03/01/2021 10:34      Labs: BNP (last 3 results) Recent Labs    02/28/21 1456  BNP [id number]*   Basic Metabolic Panel: Recent Labs  Lab 02/28/21 1456 03/01/21 0852 03/02/21 0544 03/03/21 0638  NA 141 142 135 138  K 4.4 4.6 4.0 4.0  CL 100 100 95* 98  CO2 '22 23 28 29  '$ GLUCOSE 91 77 89 99  BUN 63* 72* 37* 59*  CREATININE 7.53* 8.06* 4.60* 6.37*  CALCIUM 8.7* 8.6* 8.0* 7.4*  MG  --   --  1.5* 1.7  PHOS 9.8*  --  6.1*  --     Liver Function Tests: Recent Labs  Lab 02/28/21 1456 03/02/21 0544  AST 24  --   ALT 16  --   ALKPHOS 112  --   BILITOT 2.1*  --   PROT 8.7*  --   ALBUMIN 3.1* 2.4*   No results for input(s): LIPASE, AMYLASE in the last 168 hours.  No results for input(s): AMMONIA in the last 168 hours. CBC: Recent Labs  Lab 02/28/21 1456 03/02/21 0544 03/03/21 0638  WBC 6.1 5.7 6.5  HGB 14.2 10.8* 9.7*  HCT 42.9 32.2* 29.1*  MCV 98.6 96.4 98.0  PLT 144* 130* 129*   Cardiac Enzymes: No results for input(s): CKTOTAL, CKMB, CKMBINDEX, TROPONINI in the last 168 hours. BNP: Invalid input(s): POCBNP CBG: No results for input(s): GLUCAP in the last 168 hours. D-Dimer No results for input(s): DDIMER in the last 72 hours. Hgb A1c No results for input(s): HGBA1C in the last 72 hours. Lipid Profile Recent Labs    03/02/21 1050  CHOL 69  HDL 22*  LDLCALC 35  TRIG 62  CHOLHDL 3.1   Thyroid function studies No results for input(s): TSH, T4TOTAL, T3FREE, THYROIDAB in the last 72 hours.  Invalid input(s): FREET3 Anemia work up No results for input(s): VITAMINB12, FOLATE, FERRITIN, TIBC, IRON, RETICCTPCT in the last 72 hours. Urinalysis    Component Value Date/Time   COLORURINE YELLOW (A) 09/04/2020 1937   APPEARANCEUR CLOUDY (A) 09/04/2020 1937   APPEARANCEUR Turbid 09/19/2014 1914   LABSPEC 1.015 09/04/2020 1937   LABSPEC 1.011 09/19/2014 1914   PHURINE 8.0 09/04/2020 1937   GLUCOSEU 150 (A) 09/04/2020 1937   GLUCOSEU Negative 09/19/2014 1914   HGBUR MODERATE (A) 09/04/2020 [place] NEGATIVE 09/04/2020 [place] Negative 09/19/2014 1914   KETONESUR NEGATIVE 09/04/2020 1937   PROTEINUR >=300 (A) 09/04/2020 1937   NITRITE NEGATIVE 09/04/2020 1937   LEUKOCYTESUR LARGE (A) 09/04/2020 1937   LEUKOCYTESUR 2+ 09/19/2014 1914   Sepsis Labs Invalid input(s): PROCALCITONIN,  WBC,  LACTICIDVEN Microbiology Recent Results (from the past 240 hour(s))  Resp Panel  by RT-PCR (Flu A&B, Covid) Nasopharyngeal Swab     Status: None   Collection Time: 02/28/21  6:32 PM   Specimen: Nasopharyngeal Swab; Nasopharyngeal(NP) swabs in vial transport medium  Result Value Ref Range Status   SARS Coronavirus 2 by RT PCR NEGATIVE NEGATIVE Final    Comment: (NOTE) SARS-CoV-2 target nucleic acids are NOT DETECTED.  The SARS-CoV-2 RNA is generally detectable in upper respiratory specimens during the acute phase of infection. The lowest concentration of SARS-CoV-2 viral copies this assay can detect is 138 copies/mL. A negative result does not preclude SARS-Cov-2 infection and should not be used as the sole basis for treatment or other patient management decisions. A negative result may occur with  improper specimen collection/handling, submission of specimen other than nasopharyngeal swab, presence of viral mutation(s) within the areas targeted by this assay, and inadequate number of viral copies(<138 copies/mL). A negative result must be combined with clinical observations, patient history, and epidemiological information. The expected result is Negative.  Fact Sheet for Patients:  EntrepreneurPulse.com.au  Fact Sheet for Healthcare Providers:  IncredibleEmployment.be  This test is no t yet approved or cleared by the [place] FDA and  has been authorized for detection and/or diagnosis of SARS-CoV-2 by FDA under an Emergency Use Authorization (EUA). This EUA will remain  in effect (meaning this test can be used) for the duration of the COVID-19 declaration under Section 564(b)(1) of the Act, 21 U.S.C.section 360bbb-3(b)(1), unless the authorization is terminated  or revoked sooner.       Influenza A by PCR NEGATIVE NEGATIVE Final   Influenza B by PCR NEGATIVE NEGATIVE Final    Comment: (NOTE) The Xpert Xpress SARS-CoV-2/FLU/RSV plus assay is intended as an aid in the diagnosis of influenza from Nasopharyngeal swab  specimens and should not be used as a sole basis for treatment. Nasal washings and aspirates are unacceptable for Xpert Xpress SARS-CoV-2/FLU/RSV testing.  Fact Sheet for Patients: EntrepreneurPulse.com.au  Fact Sheet for Healthcare Providers: IncredibleEmployment.be  This test is not yet approved or cleared by the [place] FDA and has been authorized for detection and/or diagnosis of SARS-CoV-2 by FDA under an Emergency Use Authorization (EUA). This EUA will remain in effect (meaning this test can be used) for the duration of the COVID-19 declaration under Section 564(b)(1) of the Act, 21 U.S.C. section 360bbb-3(b)(1), unless the authorization is terminated or revoked.  Performed at [facility], [address] [place], [place] [address]   Body fluid culture w Gram Stain     Status: None (Preliminary result)   Collection Time: 03/01/21 10:01 AM   Specimen: PATH Cytology Pleural fluid  Result Value Ref Range Status   Specimen Description   Final    PLEURAL Performed at [facility], [address] [place], [place] [address]    Special Requests   Final    NONE Performed at [facility], [place], [place] [address]    Gram Stain NO WBC SEEN NO ORGANISMS SEEN   Final   Culture   Final    NO GROWTH < 24 HOURS Performed at [facility], [place] [address] [place], [place] [address]    Report Status PENDING  Incomplete     Total time spend on discharging this patient, including the last patient exam, discussing the hospital stay, instructions for ongoing care as it relates to all pertinent caregivers, as well as preparing the medical discharge records, prescriptions, and/or referrals as applicable, is 35 minutes.    Enzo Bi, MD  [facility] 03/03/2021, 9:44 AM

## 2021-03-03 NOTE — Progress Notes (Signed)
Hemodialysis patient known at [facility] MWF 11:00am. Some concerns about transportation were mentioned, however resolved. Medicaid transportation ([place]) has been reinstated and will start transporting tomorrow 10/14. Patient education provided. Please contact me with any dialysis placement concerns.  Elvera Bicker [facility] [phone]

## 2021-03-03 NOTE — TOC Transition Note (Signed)
Transition of Care [facility]) - CM/SW Discharge Note   Patient Details  Name: Larry Morrison MRN: [id number] Date of Birth: 31-Jul-1970  Transition of [facility]) CM/SW Contact:  Candie Chroman, LCSW Phone Number: 03/03/2021, 12:03 PM   Clinical Narrative:   Patient has orders to discharge home today. No ride home. Patient's wheelchair is not in the room so arranged EMS transport for 1:30. Address on facesheet is correct. Patient is aware he will be billed for transport. Patient confirmed he lives alone but said the door is unlocked. Notified EMS dispatch. Per HD coordinator, Medicaid transportation has been reinstate. No further concerns. CSW signing off.  Final next level of care: Home/Self Care Barriers to Discharge: Barriers Resolved   Patient Goals and CMS Choice        Discharge Placement                Patient to be transferred to facility by: EMS   Patient and family notified of of transfer: 03/03/21  Discharge Plan and Services     Post Acute Care Choice: NA                               Social Determinants of Health (SDOH) Interventions     Readmission Risk Interventions Readmission Risk Prevention Plan 09/08/2020  Transportation Screening Complete  Social Work Consult for [place] Planning/Counseling Complete  Palliative Care Screening Complete  Medication Review Press photographer) Complete  Some recent data might be hidden

## 2021-03-03 NOTE — Progress Notes (Signed)
AVS reviewed and prt verbalized understanding of all instructions. NAD noted or voiced concerns at this time.

## 2021-03-03 NOTE — Progress Notes (Signed)
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  He presents to the emergency room from dialysis with inability to access AV fistula.  Patient was sent for evaluation of fistula  Patient is known to our clinic and receives outpatient dialysis treatments at [facility], supervised by Dr. Juleen China.  Patient last received successful dialysis on Friday.   Patient laying in bed Tolerating meals Denies shortness of breath States he is ready for discharge   Objective:  Vital signs in last 24 hours:  Temp:  [98.5 F (36.9 C)-99 F (37.2 C)] 98.5 F (36.9 C) (10/13 0741) Pulse Rate:  [80-86] 80 (10/13 0741) Resp:  [18-20] 20 (10/13 0741) BP: (125-141)/(78-86) 136/86 (10/13 0741) SpO2:  [95 %-100 %] 95 % (10/13 0741)  Weight change:  Filed Weights   02/28/21 1411 03/01/21 1304  Weight: 61.2 kg 54.4 kg    Intake/Output: I/O last 3 completed shifts: In: 837 [P.O.:837] Out: 1001 [Other:1001]   Intake/Output this shift:  No intake/output data recorded.  Physical Exam: General: NAD, resting in bed  Head: Normocephalic, atraumatic. Moist oral mucosal membranes  Eyes: Anicteric  Lungs:  Diminished in bases, normal effort, 2 L [place]  Heart: Regular rate and rhythm  Abdomen:  Soft, nontender   Extremities: No peripheral edema.  Neurologic: Nonfocal, moving all four extremities  Skin: No lesions, dry, flaky BLE  Access: Right aVF no bruit at or thrill, Rt IJ Permcath    Basic Metabolic Panel: Recent Labs  Lab 02/28/21 1456 03/01/21 0852 03/02/21 0544 03/03/21 0638  NA 141 142 135 138  K 4.4 4.6 4.0 4.0  CL 100 100 95* 98  CO2 '22 23 28 29  '$ GLUCOSE 91 77 89 99  BUN 63* 72* 37* 59*  CREATININE 7.53*  8.06* 4.60* 6.37*  CALCIUM 8.7* 8.6* 8.0* 7.4*  MG  --   --  1.5* 1.7  PHOS 9.8*  --  6.1*  --      Liver Function Tests: Recent Labs  Lab 02/28/21 1456 03/02/21 0544  AST 24  --   ALT 16  --   ALKPHOS 112  --   BILITOT 2.1*  --   PROT 8.7*  --   ALBUMIN 3.1* 2.4*    No results for input(s): LIPASE, AMYLASE in the last 168 hours. No results for input(s): AMMONIA in the last 168 hours.  CBC: Recent Labs  Lab 02/28/21 1456 03/02/21 0544 03/03/21 0638  WBC 6.1 5.7 6.5  HGB 14.2 10.8* 9.7*  HCT 42.9 32.2* 29.1*  MCV 98.6 96.4 98.0  PLT 144* 130* 129*     Cardiac Enzymes: No results for input(s): CKTOTAL, CKMB, CKMBINDEX, TROPONINI in the last 168 hours.  BNP: Invalid input(s): POCBNP  CBG: No results for input(s): GLUCAP in the last 168 hours.  Microbiology: Results for orders placed or performed during the hospital encounter of 02/28/21  Resp Panel by RT-PCR (Flu A&B, Covid) Nasopharyngeal Swab     Status: None   Collection Time: 02/28/21  6:32 PM   Specimen: Nasopharyngeal Swab; Nasopharyngeal(NP) swabs in vial transport medium  Result Value Ref Range Status   SARS Coronavirus 2 by RT PCR NEGATIVE NEGATIVE Final    Comment: (NOTE) SARS-CoV-2 target nucleic acids are NOT DETECTED.  The SARS-CoV-2 RNA is generally detectable in  upper respiratory specimens during the acute phase of infection. The lowest concentration of SARS-CoV-2 viral copies this assay can detect is 138 copies/mL. A negative result does not preclude SARS-Cov-2 infection and should not be used as the sole basis for treatment or other patient management decisions. A negative result may occur with  improper specimen collection/handling, submission of specimen other than nasopharyngeal swab, presence of viral mutation(s) within the areas targeted by this assay, and inadequate number of viral copies(<138 copies/mL). A negative result must be combined with clinical observations, patient  history, and epidemiological information. The expected result is Negative.  Fact Sheet for Patients:  EntrepreneurPulse.com.au  Fact Sheet for Healthcare Providers:  IncredibleEmployment.be  This test is no t yet approved or cleared by the [place] FDA and  has been authorized for detection and/or diagnosis of SARS-CoV-2 by FDA under an Emergency Use Authorization (EUA). This EUA will remain  in effect (meaning this test can be used) for the duration of the COVID-19 declaration under Section 564(b)(1) of the Act, 21 U.S.C.section 360bbb-3(b)(1), unless the authorization is terminated  or revoked sooner.       Influenza A by PCR NEGATIVE NEGATIVE Final   Influenza B by PCR NEGATIVE NEGATIVE Final    Comment: (NOTE) The Xpert Xpress SARS-CoV-2/FLU/RSV plus assay is intended as an aid in the diagnosis of influenza from Nasopharyngeal swab specimens and should not be used as a sole basis for treatment. Nasal washings and aspirates are unacceptable for Xpert Xpress SARS-CoV-2/FLU/RSV testing.  Fact Sheet for Patients: EntrepreneurPulse.com.au  Fact Sheet for Healthcare Providers: IncredibleEmployment.be  This test is not yet approved or cleared by the [place] FDA and has been authorized for detection and/or diagnosis of SARS-CoV-2 by FDA under an Emergency Use Authorization (EUA). This EUA will remain in effect (meaning this test can be used) for the duration of the COVID-19 declaration under Section 564(b)(1) of the Act, 21 U.S.C. section 360bbb-3(b)(1), unless the authorization is terminated or revoked.  Performed at [facility], [address] [place], [place] [address]   Body fluid culture w Gram Stain     Status: None (Preliminary result)   Collection Time: 03/01/21 10:01 AM   Specimen: PATH Cytology Pleural fluid  Result Value Ref Range Status   Specimen Description   Final     PLEURAL Performed at [facility], [address], [place], [place] [address]    Special Requests   Final    NONE Performed at [facility], [address] [place], [place] [address]    Gram Stain NO WBC SEEN NO ORGANISMS SEEN   Final   Culture   Final    NO GROWTH 2 DAYS Performed at [facility], [place] [address], [place], [place] [address]    Report Status PENDING  Incomplete    Coagulation Studies: No results for input(s): LABPROT, INR in the last 72 hours.  Urinalysis: No results for input(s): COLORURINE, LABSPEC, PHURINE, GLUCOSEU, HGBUR, BILIRUBINUR, KETONESUR, PROTEINUR, UROBILINOGEN, NITRITE, LEUKOCYTESUR in the last 72 hours.  Invalid input(s): APPERANCEUR    Imaging: PERIPHERAL VASCULAR CATHETERIZATION  Result Date: 03/01/2021 See surgical note for result.  ECHOCARDIOGRAM COMPLETE  Result Date: 03/01/2021    ECHOCARDIOGRAM REPORT   Patient Name:   RONY HOEFER Date of Exam: 03/01/2021 Medical Rec #:  [id number]    Height:       64.0 in Accession #:    [id number]   Weight:       135.0 lb  Date of Birth:  1971/02/26     BSA:          1.655 m Patient Age:    89 years     BP:           151/107 mmHg Patient Gender: M            HR:           86 bpm. Exam Location:  [facility] Procedure: 2D Echo, Color Doppler, Cardiac Doppler and Strain Analysis Indications:     Elevated troponin  History:         Patient has no prior history of Echocardiogram examinations.                  Stroke and ESRD; Risk Factors:Hypertension.  Sonographer:     Charmayne Sheer Referring Phys:  [id number] [place] T TU Diagnosing Phys: Nelva Bush MD  Sonographer Comments: Global longitudinal strain was attempted. IMPRESSIONS  1. Left ventricular ejection fraction, by estimation, is <20%. The left ventricle has severely decreased function. The left ventricle demonstrates global hypokinesis. Left ventricular diastolic parameters are consistent with Grade II diastolic dysfunction  (pseudonormalization). Elevated left atrial pressure. The average left ventricular global longitudinal strain is -4.3 %. The global longitudinal strain is abnormal.  2. Right ventricular systolic function is moderately reduced. The right ventricular size is mildly enlarged. There is mildly elevated pulmonary artery systolic pressure.  3. Left atrial size was moderately dilated.  4. Right atrial size was mildly dilated.  5. The mitral valve is abnormal. Mild to moderate mitral valve regurgitation. No evidence of mitral stenosis.  6. Tricuspid valve regurgitation is moderate to severe.  7. The aortic valve is tricuspid. There is mild calcification of the aortic valve. Aortic valve regurgitation is not visualized. Mild aortic valve sclerosis is present, with no evidence of aortic valve stenosis.  8. The inferior vena cava is dilated in size with <50% respiratory variability, suggesting right atrial pressure of 15 mmHg. FINDINGS  Left Ventricle: Left ventricular ejection fraction, by estimation, is <20%. The left ventricle has severely decreased function. The left ventricle demonstrates global hypokinesis. The average left ventricular global longitudinal strain is -4.3 %. The global longitudinal strain is abnormal. The left ventricular internal cavity size was normal in size. There is no left ventricular hypertrophy. Left ventricular diastolic parameters are consistent with Grade II diastolic dysfunction (pseudonormalization). Elevated left atrial pressure. Right Ventricle: The right ventricular size is mildly enlarged. No increase in right ventricular wall thickness. Right ventricular systolic function is moderately reduced. There is mildly elevated pulmonary artery systolic pressure. The tricuspid regurgitant velocity is 2.71 m/s, and with an assumed right atrial pressure of 15 mmHg, the estimated right ventricular systolic pressure is [id number] mmHg. Left Atrium: Left atrial size was moderately dilated. Right Atrium:  Right atrial size was mildly dilated. Pericardium: There is no evidence of pericardial effusion. Mitral Valve: The mitral valve is abnormal. There is mild thickening of the mitral valve leaflet(s). Mild to moderate mitral valve regurgitation. No evidence of mitral valve stenosis. MV peak gradient, 3.4 mmHg. The mean mitral valve gradient is 2.0 mmHg. Tricuspid Valve: The tricuspid valve is normal in structure. Tricuspid valve regurgitation is moderate to severe. Aortic Valve: The aortic valve is tricuspid. There is mild calcification of the aortic valve. Aortic valve regurgitation is not visualized. Mild aortic valve sclerosis is present, with no evidence of aortic valve stenosis. Aortic valve mean gradient measures 2.0 mmHg. Aortic valve peak gradient measures 3.6 mmHg.  Aortic valve area, by VTI measures 2.45 cm. Pulmonic Valve: The pulmonic valve was normal in structure. Pulmonic valve regurgitation is mild. No evidence of pulmonic stenosis. Aorta: The aortic root and ascending aorta are structurally normal, with no evidence of dilitation. Pulmonary Artery: The pulmonary artery is of normal size. Venous: The inferior vena cava is dilated in size with less than 50% respiratory variability, suggesting right atrial pressure of 15 mmHg. IAS/Shunts: No atrial level shunt detected by color flow Doppler.  LEFT VENTRICLE PLAX 2D LVIDd:         5.10 cm   Diastology LVIDs:         4.72 cm   LV e' medial:    3.92 cm/s LV PW:         1.04 cm   LV E/e' medial:  24.7 LV IVS:        0.73 cm   LV e' lateral:   6.53 cm/s LVOT diam:     2.20 cm   LV E/e' lateral: 14.8 LV SV:         38 LV SV Index:   23        2D Longitudinal Strain LVOT Area:     3.80 cm  2D Strain GLS Avg:     -4.3 %  RIGHT VENTRICLE RV Basal diam:  4.21 cm RV S prime:     8.70 cm/s LEFT ATRIUM             Index        RIGHT ATRIUM           Index LA diam:        4.10 cm 2.48 cm/m   RA Area:     16.60 cm LA Vol (A2C):   70.0 ml 42.29 ml/m  RA Volume:   44.60  ml  26.94 ml/m LA Vol (A4C):   54.6 ml 32.98 ml/m LA Biplane Vol: 62.0 ml 37.45 ml/m  AORTIC VALVE                    PULMONIC VALVE AV Area (Vmax):    2.91 cm     PV Vmax:          0.57 m/s AV Area (Vmean):   2.54 cm     PV Vmean:         47.400 cm/s AV Area (VTI):     2.45 cm     PV VTI:           0.097 m AV Vmax:           94.80 cm/s   PV Peak grad:     1.3 mmHg AV Vmean:          66.200 cm/s  PV Mean grad:     1.0 mmHg AV VTI:            0.154 m      PR End Diast Vel: 8.29 msec AV Peak Grad:      3.6 mmHg AV Mean Grad:      2.0 mmHg LVOT Vmax:         72.50 cm/s LVOT Vmean:        44.300 cm/s LVOT VTI:          0.099 m LVOT/AV VTI ratio: 0.64  AORTA Ao Root diam: 2.80 cm MITRAL VALVE               TRICUSPID VALVE MV Area (PHT): 5.66 cm  TR Peak grad:   29.4 mmHg MV Area VTI:   2.22 cm    TR Vmax:        271.00 cm/s MV Peak grad:  3.4 mmHg MV Mean grad:  2.0 mmHg    SHUNTS MV Vmax:       0.92 m/s    Systemic VTI:  0.10 m MV Vmean:      60.1 cm/s   Systemic Diam: 2.20 cm MV Decel Time: 134 msec MV E velocity: 96.80 cm/s MV A velocity: 52.70 cm/s MV E/A ratio:  1.84 Christopher End MD Electronically signed by Nelva Bush MD Signature Date/Time: 03/01/2021/6:57:25 PM    Final      Medications:      (feeding supplement) PROSource Plus  30 mL Oral BID BM   aspirin EC  81 mg Oral Daily   carvedilol  3.125 mg Oral BID WC   Chlorhexidine Gluconate Cloth  6 each Topical Q0600   feeding supplement  237 mL Oral Q24H   losartan  25 mg Oral Daily   multivitamin  1 tablet Oral QHS   rosuvastatin  40 mg Oral Daily   sevelamer carbonate  800 mg Oral TID WC   hydrALAZINE, ondansetron (ZOFRAN) IV  Assessment/ Plan:  Mr. Bekim Venne is a 50 y.o.  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  He presents to the emergency room  from dialysis with inability to access AV fistula.  Patient was sent for evaluation of AVF (arteriovenous fistula) (HCC) [I77.0] SOB (shortness of breath) [R06.02] Pleural effusion [J90] ESRD (end stage renal disease) ([place]) [N18.6] Troponin I above reference range [R77.8] Arteriovenous fistula occlusion, initial encounter ([place]) [id number]   [facility] MWF [facility] Left AVF 49kg   End-stage renal disease on hemodialysis; will maintain outpatient schedule during admission if possible. Refused dialysis yesterday due to having a late treatment the previous night. No acute distress at this time. Next scheduled treatment on Friday.  2. Anemia of chronic kidney disease  Lab Results  Component Value Date   HGB 9.7 (L) 03/03/2021  Hemoglobin at goal, no need for ESA's  3. Secondary Hyperparathyroidism:  Lab Results  Component Value Date   CALCIUM 7.4 (L) 03/03/2021   CAION 0.76 (LL) 08/07/2019   PHOS 6.1 (H) 03/02/2021    Calcium below target. Phosphorus low but improving Continue renvela with meals We will continue to monitor bone minerals  4.  Hypertension with chronic kidney disease.  Blood pressure improved 136/86  Currently receiving Carvedilol and losartan.  5.  Nonfunctioning dialysis access Appreciate vascular attempting to declot graft.  Mechanical thrombectomy of right arm AV graft unsuccessful.  Right IJ PermCath placed during procedure as HD access.     LOS: 3 Bassem Bernasconi 10/13/202211:53 AM

## 2021-03-04 LAB — BODY FLUID CULTURE W GRAM STAIN
Culture: NO GROWTH
Gram Stain: NONE SEEN

## 2021-03-06 LAB — ACID FAST SMEAR (AFB, MYCOBACTERIA): Acid Fast Smear: NEGATIVE

## 2021-03-08 ENCOUNTER — Telehealth (INDEPENDENT_AMBULATORY_CARE_PROVIDER_SITE_OTHER): Payer: Self-pay

## 2021-03-08 NOTE — Telephone Encounter (Signed)
I attempted to contact the patient to schedule a permcath removal and a message was left for a return call. 

## 2021-03-15 LAB — MISC LABCORP TEST (SEND OUT): Labcorp test code: 5367

## 2021-03-22 DIAGNOSIS — J189 Pneumonia, unspecified organism: Secondary | ICD-10-CM

## 2021-03-22 HISTORY — DX: Pneumonia, unspecified organism: J18.9

## 2021-03-23 ENCOUNTER — Telehealth (INDEPENDENT_AMBULATORY_CARE_PROVIDER_SITE_OTHER): Payer: Self-pay

## 2021-03-23 NOTE — Telephone Encounter (Signed)
I received an order to have the patient's permcath removed and I attempted to contact and had to leave a message. I called [facility] and spoke with Will and was told the patient did not need to have his cath removed at this time.

## 2021-03-27 ENCOUNTER — Other Ambulatory Visit: Payer: Self-pay

## 2021-03-27 ENCOUNTER — Emergency Department
Admission: EM | Admit: 2021-03-27 | Discharge: 2021-03-27 | Disposition: A | Discharge status: Home / Self Care | Payer: Medicaid Other | Attending: Emergency Medicine | Admitting: Emergency Medicine

## 2021-03-27 ENCOUNTER — Emergency Department: Payer: Medicaid Other

## 2021-03-27 ENCOUNTER — Encounter: Payer: Self-pay | Admitting: Intensive Care

## 2021-03-27 DIAGNOSIS — E872 Acidosis, unspecified: Secondary | ICD-10-CM | POA: Diagnosis not present

## 2021-03-27 DIAGNOSIS — Z8616 Personal history of COVID-19: Secondary | ICD-10-CM | POA: Insufficient documentation

## 2021-03-27 DIAGNOSIS — Z87891 Personal history of nicotine dependence: Secondary | ICD-10-CM | POA: Diagnosis not present

## 2021-03-27 DIAGNOSIS — Z992 Dependence on renal dialysis: Secondary | ICD-10-CM | POA: Insufficient documentation

## 2021-03-27 DIAGNOSIS — Z20822 Contact with and (suspected) exposure to covid-19: Secondary | ICD-10-CM | POA: Diagnosis not present

## 2021-03-27 DIAGNOSIS — Z7982 Long term (current) use of aspirin: Secondary | ICD-10-CM | POA: Insufficient documentation

## 2021-03-27 DIAGNOSIS — E875 Hyperkalemia: Secondary | ICD-10-CM | POA: Insufficient documentation

## 2021-03-27 DIAGNOSIS — R197 Diarrhea, unspecified: Secondary | ICD-10-CM | POA: Diagnosis not present

## 2021-03-27 DIAGNOSIS — I120 Hypertensive chronic kidney disease with stage 5 chronic kidney disease or end stage renal disease: Secondary | ICD-10-CM | POA: Insufficient documentation

## 2021-03-27 DIAGNOSIS — Z79899 Other long term (current) drug therapy: Secondary | ICD-10-CM | POA: Diagnosis not present

## 2021-03-27 DIAGNOSIS — N2589 Other disorders resulting from impaired renal tubular function: Secondary | ICD-10-CM

## 2021-03-27 DIAGNOSIS — L03317 Cellulitis of buttock: Secondary | ICD-10-CM | POA: Diagnosis not present

## 2021-03-27 DIAGNOSIS — N186 End stage renal disease: Secondary | ICD-10-CM | POA: Diagnosis not present

## 2021-03-27 DIAGNOSIS — M533 Sacrococcygeal disorders, not elsewhere classified: Secondary | ICD-10-CM | POA: Diagnosis present

## 2021-03-27 LAB — COMPREHENSIVE METABOLIC PANEL
ALT: 22 U/L (ref 0–44)
AST: 34 U/L (ref 15–41)
Albumin: 3 g/dL — ABNORMAL LOW (ref 3.5–5.0)
Alkaline Phosphatase: 113 U/L (ref 38–126)
Anion gap: 21 — ABNORMAL HIGH (ref 5–15)
BUN: 89 mg/dL — ABNORMAL HIGH (ref 6–20)
CO2: 16 mmol/L — ABNORMAL LOW (ref 22–32)
Calcium: 8.5 mg/dL — ABNORMAL LOW (ref 8.9–10.3)
Chloride: 103 mmol/L (ref 98–111)
Creatinine, Ser: 8.96 mg/dL — ABNORMAL HIGH (ref 0.61–1.24)
GFR, Estimated: 7 mL/min — ABNORMAL LOW (ref >60)
Glucose, Bld: 71 mg/dL (ref 70–99)
Potassium: 5.9 mmol/L — ABNORMAL HIGH (ref 3.5–5.1)
Sodium: 140 mmol/L (ref 135–145)
Total Bilirubin: 1.9 mg/dL — ABNORMAL HIGH (ref 0.3–1.2)
Total Protein: 8.7 g/dL — ABNORMAL HIGH (ref 6.5–8.1)

## 2021-03-27 LAB — CBC WITH DIFFERENTIAL/PLATELET
Abs Immature Granulocytes: 0.05 10*3/uL (ref 0.00–0.07)
Basophils Absolute: 0 10*3/uL (ref 0.0–0.1)
Basophils Relative: 0 %
Eosinophils Absolute: 0.1 10*3/uL (ref 0.0–0.5)
Eosinophils Relative: 1 %
HCT: 34.2 % — ABNORMAL LOW (ref 39.0–52.0)
Hemoglobin: 10.5 g/dL — ABNORMAL LOW (ref 13.0–17.0)
Immature Granulocytes: 1 %
Lymphocytes Relative: 13 %
Lymphs Abs: 1.2 10*3/uL (ref 0.7–4.0)
MCH: 31.2 pg (ref 26.0–34.0)
MCHC: 30.7 g/dL (ref 30.0–36.0)
MCV: 101.5 fL — ABNORMAL HIGH (ref 80.0–100.0)
Monocytes Absolute: 0.6 10*3/uL (ref 0.1–1.0)
Monocytes Relative: 7 %
Neutro Abs: 7 10*3/uL (ref 1.7–7.7)
Neutrophils Relative %: 78 %
Platelets: 178 10*3/uL (ref 150–400)
RBC: 3.37 MIL/uL — ABNORMAL LOW (ref 4.22–5.81)
RDW: 17.9 % — ABNORMAL HIGH (ref 11.5–15.5)
WBC: 9 10*3/uL (ref 4.0–10.5)
nRBC: 0 % (ref 0.0–0.2)

## 2021-03-27 LAB — URINALYSIS, ROUTINE W REFLEX MICROSCOPIC
Bilirubin Urine: NEGATIVE
Glucose, UA: NEGATIVE mg/dL
Ketones, ur: NEGATIVE mg/dL
Nitrite: NEGATIVE
Protein, ur: >=300 mg/dL — AB
RBC / HPF: >50 RBC/hpf — ABNORMAL HIGH (ref 0–5)
Specific Gravity, Urine: 1.014 (ref 1.005–1.030)
WBC, UA: >50 WBC/hpf — ABNORMAL HIGH (ref 0–5)
pH: 9 — ABNORMAL HIGH (ref 5.0–8.0)

## 2021-03-27 LAB — RESP PANEL BY RT-PCR (FLU A&B, COVID) ARPGX2
Influenza A by PCR: NEGATIVE
Influenza B by PCR: NEGATIVE
SARS Coronavirus 2 by RT PCR: NEGATIVE

## 2021-03-27 MED ORDER — SEVELAMER CARBONATE 800 MG PO TABS
800.0000 mg | ORAL_TABLET | Freq: Three times a day (TID) | ORAL | Status: DC
  Filled 2021-03-27 (×3): qty 1

## 2021-03-27 MED ORDER — CHLORHEXIDINE GLUCONATE CLOTH 2 % EX PADS
6.0000 | MEDICATED_PAD | Freq: Every day | CUTANEOUS | Status: DC
  Filled 2021-03-27 (×2): qty 6

## 2021-03-27 MED ORDER — DOXYCYCLINE HYCLATE 100 MG PO CAPS
100.0000 mg | ORAL_CAPSULE | Freq: Two times a day (BID) | ORAL | 0 refills | Status: AC
Start: 2021-03-27 — End: 2021-04-06

## 2021-03-27 MED ORDER — SODIUM CHLORIDE 0.9 % IV SOLN
1.0000 g | Freq: Once | INTRAVENOUS | Status: AC
  Administered 2021-03-27: 1 g via INTRAVENOUS
  Filled 2021-03-27: qty 10

## 2021-03-27 MED ORDER — HEPARIN SODIUM (PORCINE) 1000 UNIT/ML IJ SOLN
INTRAMUSCULAR | Status: AC
  Filled 2021-03-27: qty 1

## 2021-03-27 MED ORDER — FENTANYL CITRATE PF 50 MCG/ML IJ SOSY
50.0000 ug | PREFILLED_SYRINGE | Freq: Once | INTRAMUSCULAR | Status: AC
  Administered 2021-03-27: 50 ug via INTRAVENOUS
  Filled 2021-03-27: qty 1

## 2021-03-27 MED ORDER — SODIUM ZIRCONIUM CYCLOSILICATE 10 G PO PACK
10.0000 g | PACK | Freq: Once | ORAL | Status: DC
  Filled 2021-03-27: qty 1

## 2021-03-27 NOTE — ED Notes (Signed)
Cleaned up. Pt refused diaper after.

## 2021-03-27 NOTE — ED Notes (Signed)
Pt caregiver, Santiago Glad [phone]) given update

## 2021-03-27 NOTE — ED Notes (Signed)
Mikle Bosworth, RN attempted to give pt Milly Jakob and Renvela- dialysis nurse requested to wait until after dialysis finished

## 2021-03-27 NOTE — ED Notes (Signed)
Patient states he cannot walk without a cane or [place] and them with difficulty and family is unable to pick him up. MD aware.

## 2021-03-27 NOTE — Progress Notes (Signed)
   03/27/21 0920  Clinical Encounter Type  Visited With Patient  Visit Type Initial  Referral From Other (Comment) (rounding)  Spiritual Encounters  Spiritual Needs Other (Comment)  Chaplain Burris rounding on unit; noticed Pt's head awkwardly placed against bed rail. Chaplain provided compassionate presence and hospitality, blankets. Offered words of encouragement.

## 2021-03-27 NOTE — Progress Notes (Signed)
[facility]  ROUNDING NOTE   Subjective:   Larry Morrison is a 50 y.o. 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  HPI dates pastover the last 2 or 3 days all nonbloody with worsening pain in the bilateral sacral area.  Patient denies any falls or injuries, blood in his stool, abdominal pain, nausea, vomiting, change in chronic ostomy output, chest pain, cough, fevers, headache, earache, sore throat rash or extremity pain.  He states he is not on antibiotics.  He does note he recently missed his dialysis on 10/4 as he was visiting a sick family member who passed away.  He is denying any other acute concerns at this time.  Patient was evaluated in the ER and was found to be in fluid overload hyperkalemic and acidotic and nephrology was consulted for comanagement of dialysis patient  Patient is known to our clinic and receives outpatient dialysis treatments at [facility], supervised by Dr. Juleen China.    Patient did not  receive  dialysis on Friday. Patient gave a history that he missed dialysis on Friday as he had to go to [place] for some social issues Patient offers no complaint of chest pain       Objective:  Vital signs in last 24 hours:  Temp:  [98.4 F (36.9 C)] 98.4 F (36.9 C) (11/06 0825) Pulse Rate:  [96-100] 96 (11/06 1030) Resp:  [22] 22 (11/06 0825) BP: (153-155)/(92-107) 155/92 (11/06 1030) SpO2:  [96 %] 96 % (11/06 1030) Weight:  [68 kg] 68 kg (11/06 0825)  Weight change:  Filed Weights   03/27/21 0825  Weight: 68 kg    Intake/Output: No intake/output data recorded.   Intake/Output this shift:  No  intake/output data recorded.  Physical Exam: General: NAD, resting in bed  Head: Normocephalic, atraumatic. Moist oral mucosal membranes  Eyes: Anicteric  Lungs:  Diminished in bases, normal effort, 2 L [place]  Heart: Regular rate and rhythm  Abdomen:  Soft, nontender   Extremities: No peripheral edema.  Neurologic: Nonfocal, moving all four extremities  Skin: No lesions, dry, flaky BLE  Access: Right aVF no bruit at or thrill, Rt IJ Permcath    Basic Metabolic Panel: Recent Labs  Lab 03/27/21 0834  NA 140  K 5.9*  CL 103  CO2 16*  GLUCOSE 71  BUN 89*  CREATININE 8.96*  CALCIUM 8.5*    Liver Function Tests: Recent Labs  Lab 03/27/21 0834  AST 34  ALT 22  ALKPHOS 113  BILITOT 1.9*  PROT 8.7*  ALBUMIN 3.0*   No results for input(s): LIPASE, AMYLASE in the last 168 hours. No results for input(s): AMMONIA in the last 168 hours.  CBC: Recent Labs  Lab 03/27/21 0834  WBC 9.0  NEUTROABS 7.0  HGB 10.5*  HCT 34.2*  MCV 101.5*  PLT 178    Cardiac Enzymes: No results for input(s): CKTOTAL, CKMB, CKMBINDEX, TROPONINI in the last 168 hours.  BNP: Invalid input(s): POCBNP  CBG: No results for input(s): GLUCAP in the last 168 hours.  Microbiology: Results for orders placed or performed during the hospital encounter of 02/28/21  Resp Panel by RT-PCR (Flu A&B, Covid) Nasopharyngeal Swab     Status: None   Collection  Time: 02/28/21  6:32 PM   Specimen: Nasopharyngeal Swab; Nasopharyngeal(NP) swabs in vial transport medium  Result Value Ref Range Status   SARS Coronavirus 2 by RT PCR NEGATIVE NEGATIVE Final    Comment: (NOTE) SARS-CoV-2 target nucleic acids are NOT DETECTED.  The SARS-CoV-2 RNA is generally detectable in upper respiratory specimens during the acute phase of infection. The lowest concentration of SARS-CoV-2 viral copies this assay can detect is 138 copies/mL. A negative result does not preclude SARS-Cov-2 infection and should not be used as the  sole basis for treatment or other patient management decisions. A negative result may occur with  improper specimen collection/handling, submission of specimen other than nasopharyngeal swab, presence of viral mutation(s) within the areas targeted by this assay, and inadequate number of viral copies(<138 copies/mL). A negative result must be combined with clinical observations, patient history, and epidemiological information. The expected result is Negative.  Fact Sheet for Patients:  EntrepreneurPulse.com.au  Fact Sheet for Healthcare Providers:  IncredibleEmployment.be  This test is no t yet approved or cleared by the [place] FDA and  has been authorized for detection and/or diagnosis of SARS-CoV-2 by FDA under an Emergency Use Authorization (EUA). This EUA will remain  in effect (meaning this test can be used) for the duration of the COVID-19 declaration under Section 564(b)(1) of the Act, 21 U.S.C.section 360bbb-3(b)(1), unless the authorization is terminated  or revoked sooner.       Influenza A by PCR NEGATIVE NEGATIVE Final   Influenza B by PCR NEGATIVE NEGATIVE Final    Comment: (NOTE) The Xpert Xpress SARS-CoV-2/FLU/RSV plus assay is intended as an aid in the diagnosis of influenza from Nasopharyngeal swab specimens and should not be used as a sole basis for treatment. Nasal washings and aspirates are unacceptable for Xpert Xpress SARS-CoV-2/FLU/RSV testing.  Fact Sheet for Patients: EntrepreneurPulse.com.au  Fact Sheet for Healthcare Providers: IncredibleEmployment.be  This test is not yet approved or cleared by the [place] FDA and has been authorized for detection and/or diagnosis of SARS-CoV-2 by FDA under an Emergency Use Authorization (EUA). This EUA will remain in effect (meaning this test can be used) for the duration of the COVID-19 declaration under Section 564(b)(1) of the  Act, 21 U.S.C. section 360bbb-3(b)(1), unless the authorization is terminated or revoked.  Performed at [facility], [place], [place] [address]   Acid Fast Smear (AFB)     Status: None   Collection Time: 03/01/21 10:01 AM   Specimen: PATH Cytology Pleural fluid  Result Value Ref Range Status   AFB Specimen Processing Concentration  Final   Acid Fast Smear Negative  Final    Comment: (NOTE) Performed At: [facility] [place], [place] [id number] Rush Farmer MD UU:[id number]    Source (AFB) PLEURAL  Final    Comment: Performed at [facility], [address] [place], [place] [address]  Body fluid culture w Gram Stain     Status: None   Collection Time: 03/01/21 10:01 AM   Specimen: PATH Cytology Pleural fluid  Result Value Ref Range Status   Specimen Description   Final    PLEURAL Performed at [facility], [address], [place], [place] [address]    Special Requests   Final    NONE Performed at [facility], [address] [place], [place] [address]    Gram Stain NO WBC SEEN NO ORGANISMS SEEN   Final   Culture   Final  NO GROWTH 3 DAYS Performed at [facility], [place] [address] [place], [place] [address]    Report Status 03/04/2021 FINAL  Final    Coagulation Studies: No results for input(s): LABPROT, INR in the last 72 hours.  Urinalysis: No results for input(s): COLORURINE, LABSPEC, PHURINE, GLUCOSEU, HGBUR, BILIRUBINUR, KETONESUR, PROTEINUR, UROBILINOGEN, NITRITE, LEUKOCYTESUR in the last 72 hours.  Invalid input(s): APPERANCEUR    Imaging: No results found.   Medications:    cefTRIAXone (ROCEPHIN)  IV      fentaNYL (SUBLIMAZE) injection  50 mcg Intravenous Once   sodium zirconium cyclosilicate  10 g Oral Once   REM  Assessment/ Plan:  [name] is a 50 y.o.  male with past medical conditions including anemia, hypertension, CVA with lower extremity  residual weakness wheelchair-bound, end-stage renal disease on hemodialysis, and nephrolithiasis with right nephrostomy drain.  He presents to the emergency room from dialysis with inability to access AV fistula.  Patient was sent for evaluation of sepsis ems   [facility] MWF [facility] Left AVF 49kg    Impression      1)Renal    End-stage renal disease Patient is on hemodialysis Patient is on Monday Wednesday Friday schedule as an outpatient Patient missed his last dialysis on Friday Patient is coming in with hyperkalemia, acidosis and patient CT scan shows patient has body wall edema We will dialyze patient urgently today   2)HTN    Blood pressure is stable    3)Anemia of chronic disease  CBC Latest Ref Rng & Units 03/27/2021 03/03/2021 03/02/2021  WBC 4.0 - 10.5 K/uL 9.0 6.5 5.7  Hemoglobin 13.0 - 17.0 g/dL 10.5(L) 9.7(L) 10.8(L)  Hematocrit 39.0 - 52.0 % 34.2(L) 29.1(L) 32.2(L)  Platelets 150 - 400 K/uL 178 129(L) 130(L)       HGb at goal (9--11)   4) Secondary hyperparathyroidism -CKD Mineral-Bone Disorder    Lab Results  Component Value Date   CALCIUM 8.5 (L) 03/27/2021   CAION 0.76 (LL) 08/07/2019   PHOS 6.1 (H) 03/02/2021    Secondary Hyperparathyroidism present   Phosphorus at goal.   5)Sacral wound Primary team is following   6) Electrolytes   BMP Latest Ref Rng & Units 03/27/2021 03/03/2021 03/02/2021  Glucose 70 - 99 mg/dL 71 99 89  BUN 6 - 20 mg/dL 89(H) 59(H) 37(H)  Creatinine 0.61 - 1.24 mg/dL 8.96(H) 6.37(H) 4.60(H)  Sodium 135 - 145 mmol/L 140 138 135  Potassium 3.5 - 5.1 mmol/L 5.9(H) 4.0 4.0  Chloride 98 - 111 mmol/L 103 98 95(L)  CO2 22 - 32 mmol/L 16(L) 29 28  Calcium 8.9 - 10.3 mg/dL 8.5(L) 7.4(L) 8.0(L)     Sodium Normonatremic   Potassium Hyperkalemia We will dialyze patient today We will use low K bath    7)Acid base Acidosis Patient most likely has acidosis from CKD as well as from diarrhea We will  dialyze patient today that should help    Plan   We will dialyze patient today We will dialyze patient with 2K bath That should help with hyperkalemia/fluid overload/acidosis   Addendum Patient was seen on dialysis Patient is having catheter function issues Patient venous pressure was high at Patient may need alteplase/catheter replacement as an outpatient/inpatient case patient is admitted    LOS: 0 Larry Morrison s [facility] 11/6/202211:01 AM

## 2021-03-27 NOTE — ED Provider Notes (Signed)
Emergency Medicine Provider Triage Evaluation Note  Larry Morrison , a 50 y.o. male  was evaluated in triage.  Pt complains of bedsore that is worsening.  Missed dialysis on Friday.  Review of Systems  Positive: Bedsore, dialysis patient Negative: Chest pain, shortness of breath, fever chills  Physical Exam  BP (!) 153/107 (BP Location: Right Arm)   Pulse 100   Temp 98.4 F (36.9 C) (Oral)   Resp (!) 22   Ht 5\' 2"  (1.575 m)   Wt 68 kg   SpO2 96%   BMI 27.44 kg/m  Gen:   Awake, no distress   Resp:  Normal effort  MSK:   Is wheelchair-bound Other:    Medical Decision Making  Medically screening exam initiated at 9:04 AM.  Appropriate orders placed.  Nickoles Gregori was informed that the remainder of the evaluation will be completed by another provider, this initial triage assessment does not replace that evaluation, and the importance of remaining in the ED until their evaluation is complete.     Versie Starks, PA-C 03/27/21 1658    Rada Hay, MD 03/27/21 [phone]

## 2021-03-27 NOTE — Discharge Instructions (Addendum)
Please seek medical attention for any high fevers, chest pain, shortness of breath, change in behavior, persistent vomiting, bloody stool or any other new or concerning symptoms.  

## 2021-03-27 NOTE — ED Notes (Signed)
Pt given phone to speak with mother

## 2021-03-27 NOTE — ED Notes (Signed)
Patient called from home and reports that he is missing his pants and wallet. Patient was sent home via EMS with one bag of belongings. This Probation officer took over at [employer] and only saw that one bag. International aid/development worker informed.

## 2021-03-27 NOTE — ED Provider Notes (Signed)
[facility] Emergency Department Provider Note  ____________________________________________   Event Date/Time   First MD Initiated Contact with Patient 03/27/21 [phone]     (approximate)  I have reviewed the triage vital signs and the nursing notes.   HISTORY  Chief Complaint Wound Check   HPI Larry Morrison is a 50 y.o. 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  Patient denies any falls or injuries, blood in his stool, abdominal pain, nausea, vomiting, change in chronic ostomy output, chest pain, cough, fevers, headache, earache, sore throat rash or extremity pain.  He states he is not on antibiotics.  He does note he recently missed his dialysis on 10/4 as he was visiting a sick family member who passed away.  He is denying any other acute concerns at this time.         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66   Dialysis patient [facility])    Dyspnea    Foot drop    History of kidney stones    History of nephrostomy    Hypertension    Renal disorder    Stroke [facility])     Patient Active Problem List   Diagnosis Date Noted   Cardiomyopathy ([place])    Protein-calorie malnutrition, severe 03/01/2021   Arteriovenous fistula occlusion ([facility])    Pleural effusion 02/28/2021   Arteriovenous fistula thrombosis ([place]) 41/28/7867   Metabolic acidosis 67/20/9470   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 02/28/2021   History of traumatic brain injury 09/07/2020   Cognitive impairment  96/28/3662   Metabolic encephalopathy 94/76/5465   ESRD needing dialysis ([place]) 09/04/2020   Hypoglycemia 09/04/2020   Uremia 09/04/2020   COVID-19 virus infection 06/18/2020   Stroke ([place]) 06/18/2020   Thrombocytopenia ([place]) 06/18/2020   Elevated troponin 06/18/2020   Hyperkalemia 06/13/2020   Anemia in ESRD (end-stage renal disease) ([place]) 09/17/2019   Essential hypertension 07/15/2019   Testicular torsion    Pyuria 06/10/2016   Chronic pain following surgery or procedure 12/01/2015   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[place]) 12/01/2015   Tobacco use disorder 12/01/2015   ESRD (end stage renal disease) ([place]) 04/28/2014   Obstructed nephrostomy tube ([place]) 10/23/2013   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   Neurogenic bladder 02/09/1998   Urinary calculus 02/09/1998   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    Past Surgical History:  Procedure Laterality Date   A/V SHUNT INTERVENTION N/A 03/01/2021   Procedure: A/V SHUNT INTERVENTION;  Surgeon: Katha Cabal, MD;  Location: [place] [facility];  Service: Cardiovascular;  Laterality: N/A;   AV FISTULA PLACEMENT Right 08/07/2019   Procedure: ARTERIOVENOUS (AV) FISTULA CREATION;  Surgeon: Algernon Huxley, MD;  Location: [facility] ORS;  Service: Vascular;  Laterality: Right;   AV FISTULA PLACEMENT Right 11/20/2019   Procedure: INSERTION OF ARTERIOVENOUS (AV) GORE-TEX GRAFT ARM;  Surgeon: Algernon Huxley, MD;  Location: [facility] OR[facility];  Service: Vascular;  Laterality: Right;   IR NEPHROSTOMY EXCHANGE RIGHT  09/10/2020   NEPHRECTOMY Left    NEPHRECTOMY  ORCHIECTOMY Right 10/27/2016   Procedure: PSB ORCHIECTOMY;  Surgeon: Hollice Espy, MD;  Location: [facility] OR[facility];  Service: Urology;  Laterality: Right;   ORCHIOPEXY Bilateral 10/27/2016   Procedure: ORCHIOPEXY ADULT;  Surgeon: Hollice Espy, MD;  Location: [facility] OR[facility];  Service: Urology;  Laterality: Bilateral;   SCROTAL EXPLORATION Bilateral 10/27/2016   Procedure: SCROTUM  EXPLORATION;  Surgeon: Hollice Espy, MD;  Location: [facility] OR[facility];  Service: Urology;  Laterality: Bilateral;    Prior to Admission medications   Medication Sig Start Date End Date Taking? Authorizing Provider  aspirin EC 81 MG EC tablet Take 1 tablet (81 mg total) by mouth daily. Swallow whole. 03/04/21  Yes Enzo Bi, MD  carvedilol (COREG) 3.125 MG tablet Take 1 tablet (3.125 mg total) by mouth 2 (two) times daily with a meal. 03/03/21 06/01/21 Yes Enzo Bi, MD  doxycycline (VIBRAMYCIN) 100 MG capsule Take 1 capsule (100 mg total) by mouth 2 (two) times daily for 10 days. 03/27/21 04/06/21 Yes Lucrezia Starch, MD  multivitamin (RENA-VIT) TABS tablet Take 1 tablet by mouth at bedtime. 03/03/21  Yes Enzo Bi, MD  feeding supplement (ENSURE ENLIVE / ENSURE PLUS) LIQD Take 237 mLs by mouth daily. 03/03/21   Enzo Bi, MD  losartan (COZAAR) 25 MG tablet Take 1 tablet (25 mg total) by mouth daily. 03/04/21 06/02/21  Enzo Bi, MD  sevelamer carbonate (RENVELA) 800 MG tablet Take 1 tablet (800 mg total) by mouth 3 (three) times daily with meals. 03/03/21 06/01/21  Enzo Bi, MD    Allergies Vancomycin  Family History  Family history unknown: Yes    Social History Social History   Tobacco Use   Smoking status: Former    Packs/day: 0.50    Types: Cigarettes   Smokeless tobacco: Never  Vaping Use   Vaping Use: Never used  Substance Use Topics   Alcohol use: No   Drug use: Yes    Types: Marijuana    Comment: today    Review of Systems  Review of Systems  Constitutional:  Negative for chills and fever.  HENT:  Negative for sore throat.   Eyes:  Negative for pain.  Respiratory:  Negative for cough and stridor.   Cardiovascular:  Negative for chest pain.  Gastrointestinal:  Positive for diarrhea. Negative for vomiting.  Musculoskeletal:  Positive for myalgias (b/l buttock).  Skin:  Negative for rash.  Neurological:  Negative for seizures, loss of consciousness and headaches.   Psychiatric/Behavioral:  Negative for suicidal ideas.   All other systems reviewed and are negative.    ____________________________________________   PHYSICAL EXAM:  VITAL SIGNS: ED Triage Vitals  Enc Vitals Group     BP 03/27/21 0825 (!) 153/107     Pulse Rate 03/27/21 0825 100     Resp 03/27/21 0825 (!) 22     Temp 03/27/21 0825 98.4 F (36.9 C)     Temp Source 03/27/21 0825 Oral     SpO2 03/27/21 0825 96 %     Weight 03/27/21 0825 150 lb (68 kg)     Height 03/27/21 0825 5\' 2"  (1.575 m)     Head Circumference --      Peak Flow --      Pain Score 03/27/21 0900 6     Pain Loc --      Pain Edu? --      Excl. in [place]? --    Vitals:   03/27/21 1445 03/27/21 1500  BP: (!) 143/103 (!) 138/95  Pulse: 90 89  Resp: 11 (!) 32  Temp:    SpO2: 93% 94%   Physical Exam Vitals and nursing note reviewed.  Constitutional:      Appearance: He is well-developed.  HENT:     Head: Normocephalic and atraumatic.     Right Ear: External ear normal.     Left Ear: External ear normal.     Nose: Nose normal.  Eyes:     Conjunctiva/sclera: Conjunctivae normal.  Cardiovascular:     Rate and Rhythm: Normal rate and regular rhythm.     Heart sounds: No murmur heard. Pulmonary:     Effort: Pulmonary effort is normal. Tachypnea present. No respiratory distress.     Breath sounds: Normal breath sounds.  Abdominal:     Palpations: Abdomen is soft.     Tenderness: There is no abdominal tenderness.  Musculoskeletal:     Cervical back: Neck supple.  Skin:    General: Skin is warm and dry.     Capillary Refill: Capillary refill takes less than 2 seconds.  Neurological:     Mental Status: He is alert and oriented to person, place, and time.  Psychiatric:        Mood and Affect: Mood normal.    Right-sided chest tunneled catheter placed without significant surrounding skin changes.  Abdomen soft nontender throughout.  Right nephrostomy tube in place without significant surrounding skin  changes.  Patient has some erythema induration tenderness with bilateral gluteal areas without any ulceration or clear areas of fluctuance or drainage.  No bleeding.  It does not extend to the scrotum or anterior perineum. ____________________________________________   LABS (all labs ordered are listed, but only abnormal results are displayed)  Labs Reviewed  COMPREHENSIVE METABOLIC PANEL - Abnormal; Notable for the following components:      Result Value   Potassium 5.9 (*)    CO2 16 (*)    BUN 89 (*)    Creatinine, Ser 8.96 (*)    Calcium 8.5 (*)    Total Protein 8.7 (*)    Albumin 3.0 (*)    Total Bilirubin 1.9 (*)    GFR, Estimated 7 (*)    Anion gap 21 (*)    All other components within normal limits  CBC WITH DIFFERENTIAL/PLATELET - Abnormal; Notable for the following components:   RBC 3.37 (*)    Hemoglobin 10.5 (*)    HCT 34.2 (*)    MCV 101.5 (*)    RDW 17.9 (*)    All other components within normal limits  URINALYSIS, ROUTINE W REFLEX MICROSCOPIC - Abnormal; Notable for the following components:   Color, Urine AMBER (*)    APPearance TURBID (*)    pH 9.0 (*)    Hgb urine dipstick SMALL (*)    Protein, ur >=300 (*)    Leukocytes,Ua LARGE (*)    RBC / HPF >50 (*)    WBC, UA >50 (*)    Bacteria, UA FEW (*)    All other components within normal limits  RESP PANEL BY RT-PCR (FLU A&B, COVID) ARPGX2  GASTROINTESTINAL PANEL BY PCR, STOOL (REPLACES STOOL CULTURE)  C DIFFICILE QUICK SCREEN W PCR REFLEX    LACTIC ACID, PLASMA  LACTIC ACID, PLASMA   ____________________________________________  EKG  ____________________________________________  RADIOLOGY  ED MD interpretation: CT pelvis shows diffuse body wall and mesenteric edema without evidence of abscess osteomyelitis or subcutaneous gas.  There is a small left UVJ stone and stable diffuse bladder wall thickening which appears very chronic.  There is also evidence of cholelithiasis without evidence of  cholecystitis.  No other acute pelvic process noted.  Official radiology report(s): CT PELVIS WO CONTRAST  Result Date: 03/27/2021 CLINICAL DATA:  Sacral cellulitis and sepsis.  Evaluate for abscess. EXAM: CT PELVIS WITHOUT CONTRAST TECHNIQUE: Multidetector CT imaging of the pelvis was performed following the standard protocol without intravenous contrast. COMPARISON:  04/04/2015 FINDINGS: Lower Urinary Tract: Diffuse bladder wall thickening is again demonstrated. A 3 mm radiodensity is seen at the left UVJ, suspicious for a tiny distal left ureteral calculus. Bowel: Unremarkable pelvic bowel loops. Vascular/Lymphatic: No pathologically enlarged lymph nodes or other significant abnormality. Reproductive:  No mass or other significant abnormality. Other: Cholelithiasis is again demonstrated, however gallbladder is incompletely visualized. Musculoskeletal: Diffuse body wall and mesenteric edema is seen. No evidence of abscess. No evidence of sacral or pelvic osteomyelitis or other suspicious bone lesions. IMPRESSION: Diffuse body wall and mesenteric edema. No evidence of abscess or osteomyelitis. 3 mm radiodensity at the left UVJ, suspicious for distal left ureteral calculus. Stable diffuse bladder wall thickening, which may be due to chronic cystitis or chronic bladder outlet obstruction. Cholelithiasis. Electronically Signed   By: Marlaine Hind M.D.   On: 03/27/2021 11:01    ____________________________________________   PROCEDURES  Procedure(s) performed (including Critical Care):  Procedures   ____________________________________________   INITIAL IMPRESSION / ASSESSMENT AND PLAN / ED COURSE      Patient presents with above-stated history exam complaining of diarrhea over the last 2 or 3 days nonbloody and some pain in his sacral area.  On arrival he is afebrile and hemodynamically stable and does have some erythema induration on both superior gluteal areas.  There is no palpable abscess or  fluctuance or induration.  Differential includes possible deep space infection i.e. pelvic abscess, cellulitis and osteomyelitis.  He denies any abdominal pain and has no fever, leukocytosis or abdominal tenderness to suggest diverticulitis, appendicitis, SBO or other acute process at this time.  CBC otherwise shows stable hemoglobin and platelets.  CT pelvis shows diffuse body wall and mesenteric edema without evidence of abscess osteomyelitis or subcutaneous gas.  There is a small left UVJ stone and stable diffuse bladder wall thickening which appears very chronic.  There is also evidence of cholelithiasis without evidence of cholecystitis.  No other acute pelvic process noted.   CMP remarkable for K of 5.9, bicarb of 16 and a BUN and creatinine consistent with patient's known history of ESRD and recently missed dialysis.  His anion gap is 21 which aspect is related to his uremia.  Urine was sent from patient's chronic nephrostomy which is expected appears contaminated and is likely chronically colonized.  Patient has no pain around the site and I have a low suspicion for acute clinically significant infection here.  He believes he is septic from a cellulitis and is not significantly dehydrated from his diarrhea.  I did reach out to on-call nephrologist Dr. Theador Hawthorne given patient's hyperkalemia and acidosis will arrange for patient to be dialyzed in the emergency room.  I think once patient is dialyzed given he is not septic and has no evidence of hypoxia or respiratory distress and is denying any shortness of breath or chest pain I think he will be stable for discharge with close outpatient follow-up.  He was given a dose of Rocephin for cellulitis in the emergency room pending dialysis.  With regard to the diarrhea I suspect possibly a viral enteritis.   Care patient signed over to assuming  father at approximately 1500.  Plan is to reassess patient after dialysis and likely discharge with outpatient  follow-up.     ____________________________________________   FINAL CLINICAL IMPRESSION(S) / ED DIAGNOSES  Final diagnoses:  Cellulitis of gluteal region  Hyperkalemia  Uremic acidosis  Diarrhea, unspecified type    Medications  sodium zirconium cyclosilicate (LOKELMA) packet 10 g (has no administration in time range)  Chlorhexidine Gluconate Cloth 2 % PADS 6 each (has no administration in time range)  sevelamer carbonate (RENVELA) tablet 800 mg (has no administration in time range)  heparin sodium (porcine) 1000 UNIT/ML injection (has no administration in time range)  fentaNYL (SUBLIMAZE) injection 50 mcg (50 mcg Intravenous Given 03/27/21 1406)  cefTRIAXone (ROCEPHIN) 1 g in sodium chloride 0.9 % 100 mL IVPB (1 g Intravenous New Bag/Given 03/27/21 1409)     ED Discharge Orders          Ordered    doxycycline (VIBRAMYCIN) 100 MG capsule  2 times daily        03/27/21 1409             Note:  This document was prepared using Dragon voice recognition software and may include unintentional dictation errors.    Lucrezia Starch, MD 03/27/21 [phone]

## 2021-03-27 NOTE — ED Notes (Signed)
Called EMS for transport back home 

## 2021-03-27 NOTE — ED Notes (Signed)
Dialysis nurse at bedside

## 2021-03-27 NOTE — ED Triage Notes (Signed)
Patient brought in by EMS from home for bed sore on butt. Patient is bedbound/wheelchair. He reports he lives alone and has aids that come help me. Dialysis M,W,F with access in right chest. Patient reports missing treatment Friday.

## 2021-03-27 NOTE — ED Notes (Signed)
Pt in CT.

## 2021-03-27 NOTE — ED Notes (Signed)
Patient is eating a snack. Patient is alert and oriented. NAD.

## 2021-04-03 ENCOUNTER — Emergency Department
Admission: EM | Admit: 2021-04-03 | Discharge: 2021-04-03 | Disposition: A | Discharge status: Home / Self Care | Payer: Medicaid Other | Attending: Emergency Medicine | Admitting: Emergency Medicine

## 2021-04-03 ENCOUNTER — Other Ambulatory Visit: Payer: Self-pay

## 2021-04-03 DIAGNOSIS — Z7982 Long term (current) use of aspirin: Secondary | ICD-10-CM | POA: Insufficient documentation

## 2021-04-03 DIAGNOSIS — Y92009 Unspecified place in unspecified non-institutional (private) residence as the place of occurrence of the external cause: Secondary | ICD-10-CM | POA: Insufficient documentation

## 2021-04-03 DIAGNOSIS — N186 End stage renal disease: Secondary | ICD-10-CM | POA: Diagnosis not present

## 2021-04-03 DIAGNOSIS — W19XXXA Unspecified fall, initial encounter: Secondary | ICD-10-CM

## 2021-04-03 DIAGNOSIS — R21 Rash and other nonspecific skin eruption: Secondary | ICD-10-CM | POA: Diagnosis not present

## 2021-04-03 DIAGNOSIS — Z79899 Other long term (current) drug therapy: Secondary | ICD-10-CM | POA: Diagnosis not present

## 2021-04-03 DIAGNOSIS — W050XXA Fall from non-moving wheelchair, initial encounter: Secondary | ICD-10-CM | POA: Diagnosis not present

## 2021-04-03 DIAGNOSIS — Z043 Encounter for examination and observation following other accident: Secondary | ICD-10-CM | POA: Insufficient documentation

## 2021-04-03 DIAGNOSIS — Z8616 Personal history of COVID-19: Secondary | ICD-10-CM | POA: Insufficient documentation

## 2021-04-03 DIAGNOSIS — I120 Hypertensive chronic kidney disease with stage 5 chronic kidney disease or end stage renal disease: Secondary | ICD-10-CM | POA: Insufficient documentation

## 2021-04-03 DIAGNOSIS — Z87891 Personal history of nicotine dependence: Secondary | ICD-10-CM | POA: Insufficient documentation

## 2021-04-03 DIAGNOSIS — Z992 Dependence on renal dialysis: Secondary | ICD-10-CM

## 2021-04-03 NOTE — ED Provider Notes (Signed)
[facility] Emergency Department Provider Note  ____________________________________________  Time seen: Approximately 6:10 PM  I have reviewed the triage vital signs and the nursing notes.   HISTORY  Chief Complaint Fall    HPI Larry Morrison is a 50 y.o. male with a history of CVA, ESRD on hemodialysis, hypertension who is brought to the ED by EMS from home after he fell out of his wheelchair due to falling asleep.  He denies any injury.  Denies any pain.  He has no acute complaints.  He does report a rash on his buttocks that has been present for a long period of time.  No diarrhea or constipation.  No dysuria.  No chest pain shortness of breath palpitations or syncope.    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54   Dialysis patient [facility])    Dyspnea    Foot drop    History of kidney stones    History of nephrostomy    Hypertension    Renal disorder    Stroke [facility])      Patient Active Problem List   Diagnosis Date Noted   Cardiomyopathy ([place])    Protein-calorie malnutrition, severe 03/01/2021   Arteriovenous fistula occlusion (HCC)    Pleural effusion 02/28/2021   Arteriovenous fistula thrombosis ([place]) 29/93/7169   Metabolic acidosis 67/89/3810   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 02/28/2021   History of traumatic brain injury 09/07/2020   Cognitive impairment 17/51/0258   Metabolic encephalopathy 52/77/8242   ESRD needing dialysis ([place]) 09/04/2020   Hypoglycemia 09/04/2020   Uremia 09/04/2020   COVID-19 virus infection 06/18/2020   Stroke ([place]) 06/18/2020   Thrombocytopenia ([place]) 06/18/2020   Elevated troponin 06/18/2020   Hyperkalemia 06/13/2020   Anemia in ESRD (end-stage renal disease) ([place]) 09/17/2019   Essential hypertension 07/15/2019   Testicular torsion    Pyuria 06/10/2016   Chronic pain following surgery or procedure 12/01/2015   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[place]) 12/01/2015   Tobacco use disorder 12/01/2015   ESRD (end stage renal disease) ([place]) 04/28/2014   Obstructed nephrostomy tube ([place]) 10/23/2013   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   Neurogenic bladder 02/09/1998   Urinary calculus 02/09/1998   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     Past Surgical History:  Procedure Laterality Date   A/V SHUNT INTERVENTION N/A 03/01/2021   Procedure: A/V SHUNT INTERVENTION;  Surgeon: Katha Cabal, MD;  Location: [place] [facility];  Service: Cardiovascular;  Laterality: N/A;   AV FISTULA PLACEMENT Right 08/07/2019   Procedure: ARTERIOVENOUS (AV) FISTULA CREATION;  Surgeon: Algernon Huxley, MD;  Location: [facility] ORS;  Service: Vascular;  Laterality: Right;   AV FISTULA PLACEMENT Right 11/20/2019   Procedure: INSERTION OF ARTERIOVENOUS (AV) GORE-TEX GRAFT ARM;  Surgeon: Algernon Huxley, MD;  Location: [facility] ORS;  Service: Vascular;  Laterality: Right;   IR NEPHROSTOMY EXCHANGE RIGHT  09/10/2020   NEPHRECTOMY Left    NEPHRECTOMY     ORCHIECTOMY Right 10/27/2016   Procedure: PSB ORCHIECTOMY;  Surgeon: Hollice Espy, MD;  Location: [facility] ORS;  Service: Urology;  Laterality: Right;   ORCHIOPEXY Bilateral 10/27/2016   Procedure: ORCHIOPEXY ADULT;  Surgeon: Hollice Espy, MD;  Location: [facility] ORS;  Service: Urology;  Laterality: Bilateral;   SCROTAL EXPLORATION Bilateral 10/27/2016   Procedure: SCROTUM EXPLORATION;  Surgeon: Hollice Espy, MD;  Location: [facility] ORS;  Service: Urology;  Laterality: Bilateral;     Prior to Admission medications   Medication Sig Start Date End Date Taking? Authorizing Provider  aspirin EC 81 MG EC tablet Take 1 tablet (81 mg total) by mouth daily. Swallow whole. 03/04/21   Enzo Bi, MD  carvedilol (COREG) 3.125 MG tablet Take 1 tablet (3.125 mg total) by mouth 2 (two) times daily with a meal. 03/03/21 06/01/21  Enzo Bi, MD  doxycycline (VIBRAMYCIN) 100 MG capsule Take 1  capsule (100 mg total) by mouth 2 (two) times daily for 10 days. 03/27/21 04/06/21  Lucrezia Starch, MD  feeding supplement (ENSURE ENLIVE / ENSURE PLUS) LIQD Take 237 mLs by mouth daily. 03/03/21   Enzo Bi, MD  losartan (COZAAR) 25 MG tablet Take 1 tablet (25 mg total) by mouth daily. 03/04/21 06/02/21  Enzo Bi, MD  multivitamin (RENA-VIT) TABS tablet Take 1 tablet by mouth at bedtime. 03/03/21   Enzo Bi, MD  sevelamer carbonate (RENVELA) 800 MG tablet Take 1 tablet (800 mg total) by mouth 3 (three) times daily with meals. 03/03/21 06/01/21  Enzo Bi, MD     Allergies Vancomycin   Family History  Family history unknown: Yes    Social History Social History   Tobacco Use   Smoking status: Former    Packs/day: 0.50    Types: Cigarettes   Smokeless tobacco: Never  Vaping Use   Vaping Use: Never used  Substance Use Topics   Alcohol use: No   Drug use: Yes    Types: Marijuana    Comment: today    Review of Systems  Constitutional:   No fever or chills.  ENT:   No sore throat. No rhinorrhea. Cardiovascular:   No chest pain or syncope. Respiratory:   No dyspnea or cough. Gastrointestinal:   Negative for abdominal pain, vomiting and diarrhea.  Musculoskeletal:   Negative for focal pain or swelling All other systems reviewed and are negative except as documented above in ROS and HPI.  ____________________________________________   PHYSICAL EXAM:  VITAL SIGNS: ED Triage Vitals  Enc Vitals Group     BP 04/03/21 1641 (!) 155/112     Pulse Rate 04/03/21 1641 96     Resp 04/03/21 1641 20     Temp 04/03/21 1641 97.6 F (36.4 C)     Temp Source 04/03/21 1641 Axillary     SpO2 04/03/21 1641 96 %     Weight 04/03/21 1707 149 lb 14.6 oz (68 kg)     Height 04/03/21 1707 5\' 2"  (1.575 m)     Head Circumference --      Peak Flow --      Pain Score 04/03/21 1707 7     Pain Loc --      Pain Edu? --      Excl. in [place]? --     Vital signs reviewed, nursing assessments  reviewed.   Constitutional:   Alert and oriented. Non-toxic appearance. Eyes:   Conjunctivae are normal. EOMI. PERRL. ENT      Head:   Normocephalic and atraumatic.      Nose:   Wearing a mask.      Mouth/Throat:   Wearing a mask.      Neck:   No meningismus. Full ROM. Hematological/Lymphatic/Immunilogical:   No cervical lymphadenopathy. Cardiovascular:   RRR. Symmetric bilateral radial and DP pulses.  No murmurs. Cap refill less than 2 seconds. Respiratory:   Normal respiratory effort without tachypnea/retractions. Breath sounds are clear and equal bilaterally. No  wheezes/rales/rhonchi. Gastrointestinal:   Soft and nontender. Non distended. There is no CVA tenderness.  No rebound, rigidity, or guarding. Genitourinary:   Normal.  Right nephrostomy drainage catheter in place Musculoskeletal:   Normal range of motion in all extremities. No joint effusions.  No lower extremity tenderness.  No edema. Neurologic:   Normal speech and language.  Motor grossly intact. No acute focal neurologic deficits are appreciated.  Skin:    Skin is warm, dry and intact.  No wound or inflammatory changes on the buttocks.  No rash noted.  No petechiae, purpura, or bullae.  ____________________________________________    LABS (pertinent positives/negatives) (all labs ordered are listed, but only abnormal results are displayed) Labs Reviewed - No data to display ____________________________________________   EKG    ____________________________________________    RADIOLOGY  No results found.  ____________________________________________   PROCEDURES Procedures  ____________________________________________    CLINICAL IMPRESSION / ASSESSMENT AND PLAN / ED COURSE  Medications ordered in the ED: Medications - No data to display  Pertinent labs & imaging results that were available during my care of the patient were reviewed by me and considered in my medical decision making (see chart  for details).  Larry Morrison was evaluated in Emergency Department on 04/03/2021 for the symptoms described in the history of present illness. He was evaluated in the context of the global COVID-19 pandemic, which necessitated consideration that the patient might be at risk for infection with the SARS-CoV-2 virus that causes COVID-19. Institutional protocols and algorithms that pertain to the evaluation of patients at risk for COVID-19 are in a state of rapid change based on information released by regulatory bodies including the CDC and federal and state organizations. These policies and algorithms were followed during the patient's care in the ED.   Patient presents after a accidental fall at home due to falling asleep at in his wheelchair.  No complaints.  No headaches..  No apparent injuries.  No other acute symptoms.  Stable for discharge home.  Reports he is hungry and requests food.      ____________________________________________   FINAL CLINICAL IMPRESSION(S) / ED DIAGNOSES    Final diagnoses:  Fall, initial encounter  ESRD on hemodialysis [facility])     ED Discharge Orders     None       Portions of this note were generated with dragon dictation software. Dictation errors may occur despite best attempts at proofreading.    Carrie Mew, MD 04/03/21 [phone]

## 2021-04-03 NOTE — ED Notes (Signed)
Retail banker to set up transport for PT dc

## 2021-04-03 NOTE — ED Notes (Signed)
Sandwich tray and a drink was given to pt.

## 2021-04-03 NOTE — ED Notes (Signed)
Pt brief changed. Pt bottom cleaned. Pt assisted to stretcher with EMS. Pt denies any questions at this time. Pt assisted off unit in stretcher with EMS to home

## 2021-04-03 NOTE — ED Triage Notes (Signed)
Pt arrives via EMS from home after he fell asleep in his wheelchair and fell out- pt also states he has a rash between his legs that has been there for a while- VSS

## 2021-04-09 ENCOUNTER — Other Ambulatory Visit: Payer: Self-pay

## 2021-04-09 ENCOUNTER — Inpatient Hospital Stay
Admission: EM | Admit: 2021-04-09 | Discharge: 2021-04-21 | DRG: 870 | Disposition: A | Discharge status: Home / Self Care | Payer: Medicaid Other | Attending: Internal Medicine | Admitting: Internal Medicine

## 2021-04-09 ENCOUNTER — Emergency Department: Payer: Medicaid Other

## 2021-04-09 DIAGNOSIS — I5043 Acute on chronic combined systolic (congestive) and diastolic (congestive) heart failure: Secondary | ICD-10-CM | POA: Diagnosis present

## 2021-04-09 DIAGNOSIS — R7989 Other specified abnormal findings of blood chemistry: Secondary | ICD-10-CM | POA: Diagnosis present

## 2021-04-09 DIAGNOSIS — Z4659 Encounter for fitting and adjustment of other gastrointestinal appliance and device: Secondary | ICD-10-CM

## 2021-04-09 DIAGNOSIS — E8770 Fluid overload, unspecified: Secondary | ICD-10-CM | POA: Diagnosis present

## 2021-04-09 DIAGNOSIS — I248 Other forms of acute ischemic heart disease: Secondary | ICD-10-CM | POA: Diagnosis present

## 2021-04-09 DIAGNOSIS — J189 Pneumonia, unspecified organism: Secondary | ICD-10-CM | POA: Diagnosis present

## 2021-04-09 DIAGNOSIS — R0602 Shortness of breath: Secondary | ICD-10-CM

## 2021-04-09 DIAGNOSIS — R197 Diarrhea, unspecified: Secondary | ICD-10-CM | POA: Diagnosis present

## 2021-04-09 DIAGNOSIS — Z87820 Personal history of traumatic brain injury: Secondary | ICD-10-CM

## 2021-04-09 DIAGNOSIS — I081 Rheumatic disorders of both mitral and tricuspid valves: Secondary | ICD-10-CM | POA: Diagnosis present

## 2021-04-09 DIAGNOSIS — Z936 Other artificial openings of urinary tract status: Secondary | ICD-10-CM

## 2021-04-09 DIAGNOSIS — I160 Hypertensive urgency: Secondary | ICD-10-CM | POA: Diagnosis present

## 2021-04-09 DIAGNOSIS — E875 Hyperkalemia: Secondary | ICD-10-CM | POA: Diagnosis present

## 2021-04-09 DIAGNOSIS — I10 Essential (primary) hypertension: Secondary | ICD-10-CM

## 2021-04-09 DIAGNOSIS — D631 Anemia in chronic kidney disease: Secondary | ICD-10-CM | POA: Diagnosis present

## 2021-04-09 DIAGNOSIS — Z9114 Patient's other noncompliance with medication regimen: Secondary | ICD-10-CM

## 2021-04-09 DIAGNOSIS — R4189 Other symptoms and signs involving cognitive functions and awareness: Secondary | ICD-10-CM | POA: Diagnosis present

## 2021-04-09 DIAGNOSIS — Z978 Presence of other specified devices: Secondary | ICD-10-CM

## 2021-04-09 DIAGNOSIS — Z993 Dependence on wheelchair: Secondary | ICD-10-CM

## 2021-04-09 DIAGNOSIS — J9601 Acute respiratory failure with hypoxia: Secondary | ICD-10-CM

## 2021-04-09 DIAGNOSIS — R451 Restlessness and agitation: Secondary | ICD-10-CM | POA: Diagnosis not present

## 2021-04-09 DIAGNOSIS — N2581 Secondary hyperparathyroidism of renal origin: Secondary | ICD-10-CM | POA: Diagnosis present

## 2021-04-09 DIAGNOSIS — E43 Unspecified severe protein-calorie malnutrition: Secondary | ICD-10-CM | POA: Diagnosis present

## 2021-04-09 DIAGNOSIS — R6521 Severe sepsis with septic shock: Secondary | ICD-10-CM | POA: Diagnosis present

## 2021-04-09 DIAGNOSIS — Z9115 Patient's noncompliance with renal dialysis: Secondary | ICD-10-CM

## 2021-04-09 DIAGNOSIS — I69349 Monoplegia of lower limb following cerebral infarction affecting unspecified side: Secondary | ICD-10-CM

## 2021-04-09 DIAGNOSIS — N186 End stage renal disease: Secondary | ICD-10-CM | POA: Diagnosis present

## 2021-04-09 DIAGNOSIS — Z91158 Patient's noncompliance with renal dialysis for other reason: Secondary | ICD-10-CM

## 2021-04-09 DIAGNOSIS — E440 Moderate protein-calorie malnutrition: Secondary | ICD-10-CM | POA: Diagnosis present

## 2021-04-09 DIAGNOSIS — I132 Hypertensive heart and chronic kidney disease with heart failure and with stage 5 chronic kidney disease, or end stage renal disease: Secondary | ICD-10-CM | POA: Diagnosis present

## 2021-04-09 DIAGNOSIS — Z8616 Personal history of COVID-19: Secondary | ICD-10-CM

## 2021-04-09 DIAGNOSIS — A419 Sepsis, unspecified organism: Principal | ICD-10-CM | POA: Diagnosis present

## 2021-04-09 DIAGNOSIS — N189 Chronic kidney disease, unspecified: Secondary | ICD-10-CM | POA: Diagnosis not present

## 2021-04-09 DIAGNOSIS — I469 Cardiac arrest, cause unspecified: Secondary | ICD-10-CM | POA: Diagnosis present

## 2021-04-09 DIAGNOSIS — T465X6A Underdosing of other antihypertensive drugs, initial encounter: Secondary | ICD-10-CM | POA: Diagnosis present

## 2021-04-09 DIAGNOSIS — I429 Cardiomyopathy, unspecified: Secondary | ICD-10-CM

## 2021-04-09 DIAGNOSIS — I472 Ventricular tachycardia, unspecified: Secondary | ICD-10-CM | POA: Diagnosis not present

## 2021-04-09 DIAGNOSIS — F17200 Nicotine dependence, unspecified, uncomplicated: Secondary | ICD-10-CM | POA: Diagnosis present

## 2021-04-09 DIAGNOSIS — Z01818 Encounter for other preprocedural examination: Secondary | ICD-10-CM

## 2021-04-09 DIAGNOSIS — I639 Cerebral infarction, unspecified: Secondary | ICD-10-CM | POA: Diagnosis present

## 2021-04-09 DIAGNOSIS — Z20822 Contact with and (suspected) exposure to covid-19: Secondary | ICD-10-CM | POA: Diagnosis present

## 2021-04-09 DIAGNOSIS — Z7982 Long term (current) use of aspirin: Secondary | ICD-10-CM

## 2021-04-09 DIAGNOSIS — Z6820 Body mass index (BMI) 20.0-20.9, adult: Secondary | ICD-10-CM

## 2021-04-09 DIAGNOSIS — J90 Pleural effusion, not elsewhere classified: Secondary | ICD-10-CM

## 2021-04-09 DIAGNOSIS — F1410 Cocaine abuse, uncomplicated: Secondary | ICD-10-CM | POA: Diagnosis present

## 2021-04-09 DIAGNOSIS — Z881 Allergy status to other antibiotic agents status: Secondary | ICD-10-CM

## 2021-04-09 DIAGNOSIS — R778 Other specified abnormalities of plasma proteins: Secondary | ICD-10-CM | POA: Diagnosis present

## 2021-04-09 DIAGNOSIS — E8779 Other fluid overload: Secondary | ICD-10-CM

## 2021-04-09 DIAGNOSIS — R1110 Vomiting, unspecified: Secondary | ICD-10-CM

## 2021-04-09 DIAGNOSIS — Z79899 Other long term (current) drug therapy: Secondary | ICD-10-CM

## 2021-04-09 DIAGNOSIS — F17210 Nicotine dependence, cigarettes, uncomplicated: Secondary | ICD-10-CM | POA: Diagnosis present

## 2021-04-09 DIAGNOSIS — Z992 Dependence on renal dialysis: Secondary | ICD-10-CM

## 2021-04-09 DIAGNOSIS — Z91199 Patient's noncompliance with other medical treatment and regimen due to unspecified reason: Secondary | ICD-10-CM

## 2021-04-09 DIAGNOSIS — T82898A Other specified complication of vascular prosthetic devices, implants and grafts, initial encounter: Secondary | ICD-10-CM

## 2021-04-09 DIAGNOSIS — E872 Acidosis, unspecified: Secondary | ICD-10-CM | POA: Diagnosis present

## 2021-04-09 LAB — BRAIN NATRIURETIC PEPTIDE: B Natriuretic Peptide: >4500 pg/mL — ABNORMAL HIGH (ref 0.0–100.0)

## 2021-04-09 LAB — CBC
HCT: 35.6 % — ABNORMAL LOW (ref 39.0–52.0)
Hemoglobin: 11.1 g/dL — ABNORMAL LOW (ref 13.0–17.0)
MCH: 31.3 pg (ref 26.0–34.0)
MCHC: 31.2 g/dL (ref 30.0–36.0)
MCV: 100.3 fL — ABNORMAL HIGH (ref 80.0–100.0)
Platelets: 233 10*3/uL (ref 150–400)
RBC: 3.55 MIL/uL — ABNORMAL LOW (ref 4.22–5.81)
RDW: 16.9 % — ABNORMAL HIGH (ref 11.5–15.5)
WBC: 7.8 10*3/uL (ref 4.0–10.5)
nRBC: 0 % (ref 0.0–0.2)

## 2021-04-09 LAB — BASIC METABOLIC PANEL
Anion gap: 15 (ref 5–15)
BUN: 79 mg/dL — ABNORMAL HIGH (ref 6–20)
CO2: 22 mmol/L (ref 22–32)
Calcium: 8.6 mg/dL — ABNORMAL LOW (ref 8.9–10.3)
Chloride: 104 mmol/L (ref 98–111)
Creatinine, Ser: 8.68 mg/dL — ABNORMAL HIGH (ref 0.61–1.24)
GFR, Estimated: 7 mL/min — ABNORMAL LOW (ref >60)
Glucose, Bld: 86 mg/dL (ref 70–99)
Potassium: 4.9 mmol/L (ref 3.5–5.1)
Sodium: 141 mmol/L (ref 135–145)

## 2021-04-09 LAB — PHOSPHORUS: Phosphorus: 7.8 mg/dL — ABNORMAL HIGH (ref 2.5–4.6)

## 2021-04-09 LAB — TROPONIN I (HIGH SENSITIVITY): Troponin I (High Sensitivity): 106 ng/L (ref <18)

## 2021-04-09 LAB — PROCALCITONIN: Procalcitonin: 2.88 ng/mL

## 2021-04-09 LAB — MAGNESIUM: Magnesium: 2.3 mg/dL (ref 1.7–2.4)

## 2021-04-09 LAB — RESP PANEL BY RT-PCR (FLU A&B, COVID) ARPGX2
Influenza A by PCR: NEGATIVE
Influenza B by PCR: NEGATIVE
SARS Coronavirus 2 by RT PCR: NEGATIVE

## 2021-04-09 MED ORDER — ACETAMINOPHEN 325 MG PO TABS: 650.0000 mg | ORAL_TABLET | Freq: Four times a day (QID) | ORAL | Status: AC | PRN

## 2021-04-09 MED ORDER — HEPARIN SODIUM (PORCINE) 5000 UNIT/ML IJ SOLN
5000.0000 [IU] | Freq: Three times a day (TID) | INTRAMUSCULAR | Status: DC
  Administered 2021-04-11 – 2021-04-19 (×17): 5000 [IU] via SUBCUTANEOUS
  Filled 2021-04-09 (×24): qty 1

## 2021-04-09 MED ORDER — ENSURE ENLIVE PO LIQD: 237.0000 mL | ORAL | Status: DC

## 2021-04-09 MED ORDER — CARVEDILOL 3.125 MG PO TABS
3.1250 mg | ORAL_TABLET | Freq: Two times a day (BID) | ORAL | Status: DC
  Administered 2021-04-09 – 2021-04-16 (×4): 3.125 mg via ORAL
  Filled 2021-04-09 (×7): qty 1

## 2021-04-09 MED ORDER — ACETAMINOPHEN 650 MG RE SUPP
650.0000 mg | Freq: Four times a day (QID) | RECTAL | Status: AC | PRN
  Filled 2021-04-09: qty 1

## 2021-04-09 MED ORDER — SEVELAMER CARBONATE 800 MG PO TABS
800.0000 mg | ORAL_TABLET | Freq: Three times a day (TID) | ORAL | Status: DC
  Administered 2021-04-10 – 2021-04-14 (×6): 800 mg via ORAL
  Filled 2021-04-09 (×9): qty 1

## 2021-04-09 MED ORDER — ONDANSETRON HCL 4 MG/2ML IJ SOLN: 4.0000 mg | Freq: Four times a day (QID) | INTRAMUSCULAR | Status: DC | PRN

## 2021-04-09 MED ORDER — ONDANSETRON HCL 4 MG PO TABS: 4.0000 mg | ORAL_TABLET | Freq: Four times a day (QID) | ORAL | Status: DC | PRN

## 2021-04-09 MED ORDER — ASPIRIN 81 MG PO CHEW
324.0000 mg | CHEWABLE_TABLET | Freq: Once | ORAL | Status: AC
  Administered 2021-04-09: 324 mg via ORAL
  Filled 2021-04-09: qty 4

## 2021-04-09 MED ORDER — LOSARTAN POTASSIUM 25 MG PO TABS
25.0000 mg | ORAL_TABLET | Freq: Every day | ORAL | Status: DC
  Administered 2021-04-10: 25 mg via ORAL
  Filled 2021-04-09 (×2): qty 1

## 2021-04-09 MED ORDER — RENA-VITE PO TABS
1.0000 | ORAL_TABLET | Freq: Every day | ORAL | Status: DC
  Administered 2021-04-11 – 2021-04-19 (×6): 1 via ORAL
  Filled 2021-04-09 (×10): qty 1

## 2021-04-09 MED ORDER — FUROSEMIDE 10 MG/ML IJ SOLN
80.0000 mg | Freq: Once | INTRAMUSCULAR | Status: AC
  Administered 2021-04-09: 80 mg via INTRAVENOUS
  Filled 2021-04-09: qty 8

## 2021-04-09 NOTE — ED Notes (Signed)
Pt advised he missed dialysis yesterday bc his mom is in the hospital.

## 2021-04-09 NOTE — Progress Notes (Signed)
[facility]  ROUNDING NOTE   Subjective:   [name] was admitted to [facility] on 04/09/2021 for Volume overload [E87.70]  Last hemodialysis treatment was Wednesday, November 16. Patient stayed for his entire treatment. However he missed Friday's treatment. Patient presents today with shortness of breath.   Patient is disheveled, urostomy tubes are full and do not look like they have been drained in awhile, and he has defecated in his pants.   Patient apparently was rude to the ED staff.   Patient states that his mother was in the hospital. We are unable to verify that at this time.   Patient with left pleural effusion, pulmonary edema, and with hypertensive urgency. He is to be admitted to the inpatient hospitalist service.   Objective:  Vital signs in last 24 hours:  Temp:  [98 F (36.7 C)] 98 F (36.7 C) (11/19 1529) Pulse Rate:  [100-101] 100 (11/19 1800) Resp:  [20-26] 20 (11/19 1800) BP: (163-167)/(101-117) 163/101 (11/19 1800) SpO2:  [92 %-96 %] 96 % (11/19 1800) Weight:  [68 kg] 68 kg (11/19 1530)  Weight change:  Filed Weights   04/09/21 1530  Weight: 68 kg    Intake/Output: No intake/output data recorded.   Intake/Output this shift:  No intake/output data recorded.  Physical Exam: General: Disheveled, laying on stretcher  Head: Normocephalic, atraumatic. Moist oral mucosal membranes  Eyes: Anicteric, PERRL  Neck: Supple, trachea midline  Lungs:  Clear to auscultation  Heart: Regular rate and rhythm  Abdomen:  Soft, nontender, +urostomy tubes bilateral  Extremities:  no peripheral edema.  Neurologic: Nonfocal, moving all four extremities  Skin: No lesions  Access: Left IJ permcath    Basic Metabolic Panel: Recent Labs  Lab 04/09/21 1653  NA 141  K 4.9  CL 104  CO2 22  GLUCOSE 86  BUN 79*  CREATININE 8.68*  CALCIUM 8.6*  MG 2.3  PHOS 7.8*    Liver Function Tests: No results for input(s): AST, ALT, ALKPHOS, BILITOT, PROT,  ALBUMIN in the last 168 hours. No results for input(s): LIPASE, AMYLASE in the last 168 hours. No results for input(s): AMMONIA in the last 168 hours.  CBC: Recent Labs  Lab 04/09/21 1653  WBC 7.8  HGB 11.1*  HCT 35.6*  MCV 100.3*  PLT 233    Cardiac Enzymes: No results for input(s): CKTOTAL, CKMB, CKMBINDEX, TROPONINI in the last 168 hours.  BNP: Invalid input(s): POCBNP  CBG: No results for input(s): GLUCAP in the last 168 hours.  Microbiology: Results for orders placed or performed during the hospital encounter of 03/27/21  Resp Panel by RT-PCR (Flu A&B, Covid) Nasopharyngeal Swab     Status: None   Collection Time: 03/27/21  6:00 PM   Specimen: Nasopharyngeal Swab; Nasopharyngeal(NP) swabs in vial transport medium  Result Value Ref Range Status   SARS Coronavirus 2 by RT PCR NEGATIVE NEGATIVE Final    Comment: (NOTE) SARS-CoV-2 target nucleic acids are NOT DETECTED.  The SARS-CoV-2 RNA is generally detectable in upper respiratory specimens during the acute phase of infection. The lowest concentration of SARS-CoV-2 viral copies this assay can detect is 138 copies/mL. A negative result does not preclude SARS-Cov-2 infection and should not be used as the sole basis for treatment or other patient management decisions. A negative result may occur with  improper specimen collection/handling, submission of specimen other than nasopharyngeal swab, presence of viral mutation(s) within the areas targeted by this assay, and inadequate number of viral copies(<138 copies/mL). A negative  result must be combined with clinical observations, patient history, and epidemiological information. The expected result is Negative.  Fact Sheet for Patients:  EntrepreneurPulse.com.au  Fact Sheet for Healthcare Providers:  IncredibleEmployment.be  This test is no t yet approved or cleared by the [place] FDA and  has been authorized for detection  and/or diagnosis of SARS-CoV-2 by FDA under an Emergency Use Authorization (EUA). This EUA will remain  in effect (meaning this test can be used) for the duration of the COVID-19 declaration under Section 564(b)(1) of the Act, 21 U.S.C.section 360bbb-3(b)(1), unless the authorization is terminated  or revoked sooner.       Influenza A by PCR NEGATIVE NEGATIVE Final   Influenza B by PCR NEGATIVE NEGATIVE Final    Comment: (NOTE) The Xpert Xpress SARS-CoV-2/FLU/RSV plus assay is intended as an aid in the diagnosis of influenza from Nasopharyngeal swab specimens and should not be used as a sole basis for treatment. Nasal washings and aspirates are unacceptable for Xpert Xpress SARS-CoV-2/FLU/RSV testing.  Fact Sheet for Patients: EntrepreneurPulse.com.au  Fact Sheet for Healthcare Providers: IncredibleEmployment.be  This test is not yet approved or cleared by the [place] FDA and has been authorized for detection and/or diagnosis of SARS-CoV-2 by FDA under an Emergency Use Authorization (EUA). This EUA will remain in effect (meaning this test can be used) for the duration of the COVID-19 declaration under Section 564(b)(1) of the Act, 21 U.S.C. section 360bbb-3(b)(1), unless the authorization is terminated or revoked.  Performed at [facility], [address] [place], [place] [address]     Coagulation Studies: No results for input(s): LABPROT, INR in the last 72 hours.  Urinalysis: No results for input(s): COLORURINE, LABSPEC, PHURINE, GLUCOSEU, HGBUR, BILIRUBINUR, KETONESUR, PROTEINUR, UROBILINOGEN, NITRITE, LEUKOCYTESUR in the last 72 hours.  Invalid input(s): APPERANCEUR    Imaging: DG Chest 2 View  Result Date: 04/09/2021 CLINICAL DATA:  Pleural effusion, shortness of breath EXAM: CHEST - 2 VIEW COMPARISON:  Previous studies including the examination 03/01/2021 FINDINGS: Transverse diameter of heart is increased.  Central pulmonary vessels are slightly less prominent. There is increased opacity in the left mid and left lower lung fields. Part of the homogeneous opacity in the left parahilar region may be due to fluid in the interlobar fissure. There is blunting of left lateral CP angle. Right lateral CP angle is clear. There is no pneumothorax. There is interval placement of large caliber central venous catheter through the right jugular vein with its tip in the right atrium. There is a pigtail drainage catheter in the right upper quadrant of abdomen. IMPRESSION: Cardiomegaly. Increased homogeneous opacity is seen in the left mid and left lower lung fields suggesting increase in left pleural effusion and possibly underlying atelectasis/pneumonia. Central pulmonary vessels are slightly less prominent. There are no signs of alveolar pulmonary edema in the right lung. Electronically Signed   By: Elmer Picker M.D.   On: 04/09/2021 17:14     Medications:     aspirin  324 mg Oral Once   carvedilol  3.125 mg Oral BID WC   feeding supplement  237 mL Oral Q24H   furosemide  80 mg Intravenous Once   heparin  5,000 Units Subcutaneous Q8H   [START ON 04/10/2021] losartan  25 mg Oral Daily   multivitamin  1 tablet Oral QHS   [START ON 04/10/2021] sevelamer carbonate  800 mg Oral TID WC   acetaminophen **OR** acetaminophen, ondansetron **OR** ondansetron (ZOFRAN) IV  Assessment/ Plan:  Mr. Larry  Morrison is a 50 y.o. black male with end stage renal disease on hemodialysis, learning disability, CVA with lower extremity residual weakness and wheel chair bound, obstructive uropathy with urostomy tubes, hypertension, who is admitted to [facility] on 04/09/2021 for Volume overload [E87.70]  [facility] MWF [facility] Left IJ permcath 51kg  End stage renal disease: missed treatment and now with hypertension and pulmonary edema with pleural effusion. No acute indication for dialysis. However will evaluate again in the morning  to determine if he needs dialysis tomorrow.   Hypertension: patient admits to not taking his medications. Patient with hypertensive urgency. Restart carvedilol, furosemide and losartan  Anemia with chronic kidney disease: hemoglobin 11.1. Macrocytic. EPO with MWF HD treatments.   Secondary Hyperparathyroidism: Restart sevelamer with meals.    LOS: 0 Deneane Stifter 11/19/20227:12 PM

## 2021-04-09 NOTE — ED Notes (Signed)
Pt noted to have foley catheter that pt states has not been changed in "awhile". Foul smelling odor.

## 2021-04-09 NOTE — ED Provider Notes (Signed)
[facility]  ____________________________________________   Event Date/Time   First MD Initiated Contact with Patient 04/09/21 1549     (approximate)  I have reviewed the triage vital signs and the nursing notes.   HISTORY  Chief Complaint Shortness of Breath    HPI Larry Morrison is a 50 y.o. male with past medical history of end-stage renal disease on dialysis, hypertension, CVA, anemia who presents after missing dialysis with shortness of breath.  Patient last was dialyzed on Wednesday.  He missed his Friday session.  Gets dialyzed Monday Wednesday Friday.  Last night developed dyspnea.  He denies cough congestion fevers or chills.  He denies chest pain.  Denies lower extremity edema.  Patient does have a nephrostomy tube and does make urine through this.  He missed dialysis because his mom was sick and admitted at [facility].  Patient denies abdominal pain nausea vomiting.  Currently complains of hunger.         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 02/28/2021   History of traumatic brain injury 09/07/2020   Cognitive impairment 56/25/6389   Metabolic encephalopathy 37/34/2876   ESRD needing dialysis ([place]) 09/04/2020   Hypoglycemia 09/04/2020   Uremia 09/04/2020   COVID-19 virus infection 06/18/2020   Stroke ([place]) 06/18/2020   Thrombocytopenia ([place]) 06/18/2020   Elevated troponin 06/18/2020    Hyperkalemia 06/13/2020   Anemia in ESRD (end-stage renal disease) ([place]) 09/17/2019   Essential hypertension 07/15/2019   Testicular torsion    Pyuria 06/10/2016   Chronic pain following surgery or procedure 12/01/2015   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[place]) 12/01/2015   Tobacco use disorder 12/01/2015   ESRD (end stage renal disease) ([place]) 04/28/2014   Obstructed nephrostomy tube ([place]) 10/23/2013   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   Neurogenic bladder 02/09/1998   Urinary calculus 02/09/1998   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    Past Surgical History:  Procedure Laterality Date   A/V SHUNT INTERVENTION N/A 03/01/2021   Procedure: A/V SHUNT INTERVENTION;  Surgeon: Katha Cabal, MD;  Location: [place] [facility];  Service: Cardiovascular;  Laterality: N/A;   AV FISTULA PLACEMENT Right 08/07/2019   Procedure: ARTERIOVENOUS (AV) FISTULA CREATION;  Surgeon: Algernon Huxley, MD;  Location: [facility] ORS;  Service: Vascular;  Laterality: Right;   AV FISTULA PLACEMENT Right 11/20/2019   Procedure: INSERTION OF ARTERIOVENOUS (AV) GORE-TEX GRAFT ARM;  Surgeon: Algernon Huxley, MD;  Location: [facility] ORS;  Service: Vascular;  Laterality: Right;   IR NEPHROSTOMY EXCHANGE RIGHT  09/10/2020   NEPHRECTOMY Left    NEPHRECTOMY     ORCHIECTOMY Right 10/27/2016   Procedure: PSB ORCHIECTOMY;  Surgeon: Hollice Espy, MD;  Location: [facility] ORS;  Service: Urology;  Laterality: Right;   ORCHIOPEXY Bilateral 10/27/2016   Procedure: ORCHIOPEXY ADULT;  Surgeon: Hollice Espy,  MD;  Location: [facility] ORS;  Service: Urology;  Laterality: Bilateral;   SCROTAL EXPLORATION Bilateral 10/27/2016   Procedure: SCROTUM EXPLORATION;  Surgeon: Hollice Espy, MD;  Location: [facility] ORS;  Service: Urology;  Laterality: Bilateral;    Prior to Admission medications   Medication Sig Start Date End Date Taking? Authorizing Provider  aspirin EC 81 MG EC tablet Take 1 tablet (81 mg total) by mouth  daily. Swallow whole. 03/04/21   Enzo Bi, MD  carvedilol (COREG) 3.125 MG tablet Take 1 tablet (3.125 mg total) by mouth 2 (two) times daily with a meal. 03/03/21 06/01/21  Enzo Bi, MD  feeding supplement (ENSURE ENLIVE / ENSURE PLUS) LIQD Take 237 mLs by mouth daily. 03/03/21   Enzo Bi, MD  losartan (COZAAR) 25 MG tablet Take 1 tablet (25 mg total) by mouth daily. 03/04/21 06/02/21  Enzo Bi, MD  multivitamin (RENA-VIT) TABS tablet Take 1 tablet by mouth at bedtime. 03/03/21   Enzo Bi, MD  sevelamer carbonate (RENVELA) 800 MG tablet Take 1 tablet (800 mg total) by mouth 3 (three) times daily with meals. 03/03/21 06/01/21  Enzo Bi, MD    Allergies Vancomycin  Family History  Family history unknown: Yes    Social History Social History   Tobacco Use   Smoking status: Former    Packs/day: 0.50    Types: Cigarettes   Smokeless tobacco: Never  Vaping Use   Vaping Use: Never used  Substance Use Topics   Alcohol use: No   Drug use: Yes    Types: Marijuana    Comment: today    Review of Systems   Review of Systems  Constitutional:  Negative for appetite change, chills and fever.  Respiratory:  Positive for shortness of breath. Negative for cough and chest tightness.   Cardiovascular:  Negative for chest pain, palpitations and leg swelling.  Gastrointestinal:  Negative for abdominal pain, nausea and vomiting.  All other systems reviewed and are negative.  Physical Exam Updated Vital Signs BP (!) 163/101   Pulse 100   Temp 98 F (36.7 C) (Oral)   Resp 20   Ht 5\' 2"  (1.575 m)   Wt 68 kg   SpO2 96%   BMI 27.44 kg/m   Physical Exam Vitals and nursing note reviewed.  Constitutional:      General: He is not in acute distress.    Appearance: Normal appearance.     Comments: Chronically ill-appearing  HENT:     Head: Normocephalic and atraumatic.  Eyes:     General: No scleral icterus.    Conjunctiva/sclera: Conjunctivae normal.  Cardiovascular:     Rate and  Rhythm: Normal rate and regular rhythm.  Pulmonary:     Effort: Pulmonary effort is normal. Tachypnea present. No respiratory distress.     Breath sounds: Normal breath sounds. No wheezing.  Abdominal:     Palpations: Abdomen is soft.     Tenderness: There is no abdominal tenderness.     Comments: Nephrostomy tube in place draining yellow urine  Musculoskeletal:        General: No deformity or signs of injury.     Cervical back: Normal range of motion.     Right lower leg: No edema.     Left lower leg: No edema.  Skin:    Coloration: Skin is not jaundiced or pale.  Neurological:     General: No focal deficit present.     Mental Status: He is alert and oriented to person, place, and  time. Mental status is at baseline.  Psychiatric:        Mood and Affect: Mood normal.        Behavior: Behavior normal.     LABS (all labs ordered are listed, but only abnormal results are displayed)  Labs Reviewed  CBC - Abnormal; Notable for the following components:      Result Value   RBC 3.55 (*)    Hemoglobin 11.1 (*)    HCT 35.6 (*)    MCV 100.3 (*)    RDW 16.9 (*)    All other components within normal limits  BASIC METABOLIC PANEL - Abnormal; Notable for the following components:   BUN 79 (*)    Creatinine, Ser 8.68 (*)    Calcium 8.6 (*)    GFR, Estimated 7 (*)    All other components within normal limits  PHOSPHORUS - Abnormal; Notable for the following components:   Phosphorus 7.8 (*)    All other components within normal limits  BRAIN NATRIURETIC PEPTIDE - Abnormal; Notable for the following components:   B Natriuretic Peptide >4,500.0 (*)    All other components within normal limits  TROPONIN I (HIGH SENSITIVITY) - Abnormal; Notable for the following components:   Troponin I (High Sensitivity) 106 (*)    All other components within normal limits  RESP PANEL BY RT-PCR (FLU A&B, COVID) ARPGX2  MAGNESIUM   ____________________________________________  EKG  Normal sinus  rhythm, right axis deviation, prolonged QT interval, no acute ischemic changes ____________________________________________  RADIOLOGY I, Madelin Headings, personally viewed and evaluated these images (plain radiographs) as part of my medical decision making, as well as reviewing the written report by the radiologist.  ED MD interpretation: I reviewed the chest x-ray which shows a pleural effusion that is larger than prior    ____________________________________________   PROCEDURES  Procedure(s) performed (including Critical Care):  Procedures   ____________________________________________   INITIAL IMPRESSION / ASSESSMENT AND PLAN / ED COURSE  Patient is a 50 year old male with end-stage renal disease who presents with shortness of breath.  He did miss his last dialysis session and was last dialyzed 4 days ago on Wednesday, does not Saturday missed his Friday session.  He is mildly tachypneic oxygen saturations borderline 92 to 94% on room air.  He is chronically ill-appearing but is not in acute distress.  Lungs overall are clear and he has no other acute abnormalities on exam.  Initial EKG does not show any obvious signs of hyperkalemia no peaked T waves and normal QRS duration, normal sinus rhythm.  Will check labs and chest x-ray and reassess need for urgent dialysis.  Patient's labs are overall reassuring.  His potassium is 4.9, BUN 79.  Patient's chest x-ray notably has a fairly large pleural effusion.  He has had a left-sided pleural effusion in the past and looking at prior looks like it has been transudative in the past and thought to be due to his end-stage renal disease.  This is larger today.  Patient is tachypneic and borderline hypoxic.  Given this increase in his pleural effusion in the setting of having missed dialysis I do feel that he would benefit from urgent dialysis.  Spoke with Dr. Juleen China and unfortunately they are unable to do this in an expedited manner because  they are backed up.  Given this I will consult the hospitalist for admission for dialysis.   ____________________________________________   FINAL CLINICAL IMPRESSION(S) / ED DIAGNOSES  Final diagnoses:  Shortness of breath  Pleural effusion     ED Discharge Orders     None        Note:  This document was prepared using Dragon voice recognition software and may include unintentional dictation errors.    Rada Hay, MD 04/09/21 [phone]

## 2021-04-09 NOTE — ED Triage Notes (Signed)
Pt to ED [facility] from home missed dialysis x4. Pt appears shob, labored breathing.   States has missed dialysis because mother has been sick

## 2021-04-09 NOTE — ED Notes (Signed)
Pt rang bell reports he had a bowel movement, RN changed pt. Pt able to moved side to side. Repositioned pt in bed and provided with some blankets. No other needs at present
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
## 2021-04-09 NOTE — H&P (Addendum)
History and Physical   Murvin Gift SEG:[id number] DOB: 1970-12-31 DOA: 04/09/2021  PCP: [facility] [place]  Outpatient Specialists: Dr. Lucky Cowboy, cardiovascular Patient coming from: Home via EMS  I have personally briefly reviewed patient's old medical records in [place].  Chief Concern: Shortness of breath with labored breathing  HPI: Larry Morrison is a 50 y.o. male with medical history significant for end-stage renal disease on hemodialysis, frequent noncompliance with hemodialysis, hypertension, severe protein calorie malnutrition, elevated troponin, history of pleural effusion, anemia due to chronic kidney disease, tobacco use disorder, history of CVA, history of TBI, who presents emergency department for chief concerns of shortness of breath.  He reports that shortness of breath started on a.m. on day of admission.  He reports this feels similar to previous episodes of missing dialysis session.  He states that he would never miss dialysis sessions again.  He states he missed it because he had to see his mother yesterday.  Bedside shortness of breath with exertion and laying flat, he denies any chest pain, double vision, headaches, abdominal pain, syncope, loss of consciousness, changes to his lower extremity swelling.  He does endorse diarrhea that has been ongoing for the last 2 to 3 days.  He denies any recent antibiotic use and/or known sick contacts.  At bedside he is able to tell me his name, age, current location of hospital.  When his dinner plate arrived, and he saw salad, he explained loudly how this please he is that he has received a salad and that there is broccoli on his plate of chicken and rice.    I encouraged him to eat his vegetables as this is healthy for him.  Social history: He lives at home by himself. He is disabled and formerly worked as Pharmacist, hospital. He is a current tobacco user, smoking 2-3 cigarettes per day. At his peak, he smoked 3 ppd. He  denies etoh use. He smokes thc weekly.  Vaccination history: He thinks he is vaccinated  ROS: Constitutional: no weight change, no fever ENT/Mouth: no sore throat, no rhinorrhea Eyes: no eye pain, no vision changes Cardiovascular: no chest pain, no dyspnea,  no edema, no palpitations Respiratory: no cough, no sputum, no wheezing Gastrointestinal: no nausea, no vomiting, + diarrhea, no constipation Genitourinary: no urinary incontinence, no dysuria, no hematuria Musculoskeletal: no arthralgias, no myalgias Skin: no skin lesions, no pruritus, Neuro: + weakness, no loss of consciousness, no syncope Psych: no anxiety, no depression, + decrease appetite Heme/Lymph: no bruising, no bleeding  ED Course: Discussed with emergency medicine provider, patient requiring hospitalization for due to volume overload from missing dialysis session.  Vitals the emergency department was remarkable for temperature of 98, respiration rate of 22, heart rate of 101, initial blood pressure 167/117, SPO2 of 92% on room air.  Labs in the emergency department was remarkable for serum sodium 141, potassium 4.9, chloride 104, bicarb 22, BUN 79, serum creatinine 8.68, nonfasting blood glucose 86, GFR of 7.  WBC was 7.8, hemoglobin 11.1, platelets 233.  BNP elevated at greater than 4500.  High sensitive troponin was 106, compared to high sensitive troponin from October 12 was 143 and 147.  In the emergency department patient was ordered aspirin 324 mg p.o. once.  Assessment/Plan  Principal Problem:   Volume overload Active Problems:   ESRD (end stage renal disease) (HCC)   Tobacco use disorder   Essential hypertension   Anemia in ESRD (end-stage renal disease) ([place])   Stroke ([place])  Elevated troponin   History of traumatic brain injury   Cognitive impairment   Pleural effusion   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   Protein-calorie malnutrition, severe   Noncompliance of  patient with renal dialysis ([place])   # Volume overload secondary to multifactorial due to pulmonary congestion and right mid and lower lobe pleural effusion in setting of missing hemodialysis session and severe combined heart failure  # End-stage renal disease on hemodialysis Monday Wednesday Friday - Patient missed dialysis session Friday, 04/08/2021 - Patient had incomplete dialysis session on 04/06/2021 - Nephrology has been consulted, Dr. Juleen China will order dialysis treatments for patient while he needs to be admitted - Furosemide 80 mg IV once - Strict I's and O's - After hemodialysis session, would recommend repeat chest x-ray and if pleural effusion is still present, would recommend a.m. team to consult IR for left-sided thoracentesis  # Left-sided thoracentesis-present on admission, as above  # History of hypertension-patient has been noncompliant since discharge from the hospital in October - Resumed losartan 25 mg daily starting on 04/10/2021  # Severe combined heart failure-patient has not taken his Coreg and or losartan since being discharged from the hospital - Resumed Coreg 3.125 mg p.o. twice daily starting day of admission  # History of CVA-patient is wheelchair-bound  # Severe protein caloric malnutrition  # Elevated troponin presumed secondary to demand ischemia in setting of volume overload due to noncompliance with hemodialysis session  # Diarrhea-present on admission, check C. difficile and GI panel given patient's risk of infectious diarrhea in setting of end-stage renal disease and frequent need of a healthcare facility  Chart reviewed.   02/28/2021 - 03/03/2021: Patient was hospitalized for severe congestive heart failure.  Patient was seen by cardiologist team at that time.  Patient had declined ischemic work-up.  Cardiology prescribed patient Coreg and losartan.  Patient was also recommended to take aspirin.  Patient states that he will follow-up with [facility] for  further additional testing.  A statin medication was not prescribed as patient's LDL was low at 35.  03/01/2021: Complete echo was read as left ventricular ejection fraction estimated at less than 94%, grade 2 diastolic dysfunction left atrial size was moderately dilated, right atrial size was mildly dilated.  The mitral valve is abnormal.  Mild to moderate mitral valve regurgitation.  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78   Dialysis patient [facility])    Dyspnea    Foot drop    History of kidney stones    History of nephrostomy    Hypertension    Renal disorder    Stroke [facility])    Past Surgical History:  Procedure Laterality Date   A/V SHUNT INTERVENTION N/A 03/01/2021   Procedure: A/V SHUNT INTERVENTION;  Surgeon: Katha Cabal, MD;  Location: [place] [facility];  Service: Cardiovascular;  Laterality: N/A;   AV FISTULA PLACEMENT Right 08/07/2019   Procedure: ARTERIOVENOUS (AV) FISTULA CREATION;  Surgeon: Algernon Huxley, MD;  Location: [facility] ORS;  Service: Vascular;  Laterality: Right;   AV FISTULA PLACEMENT Right 11/20/2019   Procedure: INSERTION OF ARTERIOVENOUS (AV) GORE-TEX GRAFT ARM;  Surgeon: Algernon Huxley, MD;  Location: [facility] ORS;  Service: Vascular;  Laterality: Right;   IR NEPHROSTOMY EXCHANGE RIGHT  09/10/2020   NEPHRECTOMY Left    NEPHRECTOMY     ORCHIECTOMY Right 10/27/2016   Procedure: PSB ORCHIECTOMY;  Surgeon: Hollice Espy, MD;  Location: [facility] ORS;  Service: Urology;  Laterality: Right;   ORCHIOPEXY Bilateral 10/27/2016   Procedure: ORCHIOPEXY ADULT;  Surgeon: Hollice Espy, MD;  Location: [facility] ORS;  Service: Urology;  Laterality: Bilateral;   SCROTAL EXPLORATION  Bilateral 10/27/2016   Procedure: SCROTUM EXPLORATION;  Surgeon: Hollice Espy, MD;  Location: [facility] ORS;  Service: Urology;  Laterality: Bilateral;   Social History:  reports that he has quit smoking. His smoking use included cigarettes. He smoked an average of .5 packs per day. He has never used smokeless tobacco. He reports current drug use. Drug: Marijuana. He reports that he does not drink alcohol.  Allergies  Allergen Reactions   Vancomycin     Patient denies - repeated denial to pharm tech 09-05-2020   Family History  Family history unknown: Yes   Family history: Family history reviewed and not pertinent  Prior to Admission medications   Medication Sig Start Date End Date Taking? Authorizing Provider  aspirin EC 81 MG EC tablet Take 1 tablet (81 mg total) by mouth daily. Swallow whole. 03/04/21   Enzo Bi, MD  carvedilol (COREG) 3.125 MG tablet Take 1 tablet (3.125 mg total) by mouth 2 (two) times daily with a meal. Patient not taking: Reported on 04/09/2021 03/03/21 06/01/21  Enzo Bi, MD  feeding supplement (ENSURE ENLIVE / ENSURE PLUS) LIQD Take 237 mLs by mouth daily. Patient not taking: Reported on 04/09/2021 03/03/21   Enzo Bi, MD  losartan (COZAAR) 25 MG tablet Take 1 tablet (25 mg total) by mouth daily. Patient not taking: Reported on 04/09/2021 03/04/21 06/02/21  Enzo Bi, MD  multivitamin (RENA-VIT) TABS tablet Take 1 tablet by mouth at bedtime. Patient not taking: Reported on 04/09/2021 03/03/21   Enzo Bi, MD  sevelamer carbonate (RENVELA) 800 MG tablet Take 1 tablet (800 mg total) by mouth 3 (three) times daily with meals. Patient not taking: Reported on 04/09/2021 03/03/21 06/01/21  Enzo Bi, MD   Physical Exam: Vitals:   04/09/21 1529 04/09/21 1530 04/09/21 1800  BP: (!) 167/117  (!) 163/101  Pulse: (!) 101  100  Resp: (!) 26  20  Temp: 98 F (36.7 C)    TempSrc: Oral    SpO2: 92%  96%  Weight:  68 kg   Height:  5\' 2"  (1.575 m)    Constitutional:  appears frail, cachectic, malnourished, NAD, calm, comfortable Eyes: PERRL, lids and conjunctivae normal ENMT: Mucous membranes are moist. Posterior pharynx clear of any exudate or lesions. Age-appropriate dentition. Hearing appropriate Neck: normal, supple, no masses, no thyromegaly Respiratory: clear to auscultation bilaterally, no wheezing, no crackles. Mild increased respiratory effort. No accessory muscle use.  Cardiovascular: Regular rate and rhythm, no murmurs / rubs / gallops. Mild lower extremity edema. 2+ pedal pulses. No carotid bruits.  Abdomen: no tenderness, no masses palpated, no hepatosplenomegaly. Bowel sounds positive.  Musculoskeletal: no clubbing / cyanosis. Atrophic bilateral lower extremities. Good ROM, no contractures, no atrophy. Normal muscle tone. Nephrostomy tube in place  Skin: no rashes, lesions, ulcers. No induration. Bilateral dry lower extremities. Neurologic: Sensation intact. Strength 5/5 in all 4.  Psychiatric: Normal judgment and insight. Alert and oriented x 3. Normal mood.   EKG: independently reviewed, showing sinus rhythm with rate of 98, QTc 518  Chest x-ray on Admission: I personally  reviewed and I agree with radiologist reading as below.  DG Chest 2 View  Result Date: 04/09/2021 CLINICAL DATA:  Pleural effusion, shortness of breath EXAM: CHEST - 2 VIEW COMPARISON:  Previous studies including the examination 03/01/2021 FINDINGS: Transverse diameter of heart is increased. Central pulmonary vessels are slightly less prominent. There is increased opacity in the left mid and left lower lung fields. Part of the homogeneous opacity in the left parahilar region may be due to fluid in the interlobar fissure. There is blunting of left lateral CP angle. Right lateral CP angle is clear. There is no pneumothorax. There is interval placement of large caliber central venous catheter through the right jugular vein with its tip in the right atrium. There is a pigtail  drainage catheter in the right upper quadrant of abdomen. IMPRESSION: Cardiomegaly. Increased homogeneous opacity is seen in the left mid and left lower lung fields suggesting increase in left pleural effusion and possibly underlying atelectasis/pneumonia. Central pulmonary vessels are slightly less prominent. There are no signs of alveolar pulmonary edema in the right lung. Electronically Signed   By: Elmer Picker M.D.   On: 04/09/2021 17:14    Labs on Admission: I have personally reviewed following labs  CBC: Recent Labs  Lab 04/09/21 1653  WBC 7.8  HGB 11.1*  HCT 35.6*  MCV 100.3*  PLT 035   Basic Metabolic Panel: Recent Labs  Lab 04/09/21 1653  NA 141  K 4.9  CL 104  CO2 22  GLUCOSE 86  BUN 79*  CREATININE 8.68*  CALCIUM 8.6*  MG 2.3  PHOS 7.8*   GFR: Estimated Creatinine Clearance: 8.4 mL/min (A) (by C-G formula based on SCr of 8.68 mg/dL (H)).  Urine analysis:    Component Value Date/Time   COLORURINE AMBER (A) 03/27/2021 1400   APPEARANCEUR TURBID (A) 03/27/2021 1400   APPEARANCEUR Turbid 09/19/2014 1914   LABSPEC 1.014 03/27/2021 1400   LABSPEC 1.011 09/19/2014 1914   PHURINE 9.0 (H) 03/27/2021 1400   GLUCOSEU NEGATIVE 03/27/2021 1400   GLUCOSEU Negative 09/19/2014 1914   HGBUR SMALL (A) 03/27/2021 1400   BILIRUBINUR NEGATIVE 03/27/2021 1400   BILIRUBINUR Negative 09/19/2014 1914   KETONESUR NEGATIVE 03/27/2021 1400   PROTEINUR >=300 (A) 03/27/2021 1400   NITRITE NEGATIVE 03/27/2021 1400   LEUKOCYTESUR LARGE (A) 03/27/2021 1400   LEUKOCYTESUR 2+ 09/19/2014 1914   Dr. Tobie Poet [facility]  If 7PM-7AM, please contact overnight-coverage provider If 7AM-7PM, please contact day coverage provider [email or web address]  04/09/2021, 7:05 PM

## 2021-04-10 DIAGNOSIS — F1410 Cocaine abuse, uncomplicated: Secondary | ICD-10-CM | POA: Diagnosis present

## 2021-04-10 DIAGNOSIS — J9601 Acute respiratory failure with hypoxia: Secondary | ICD-10-CM | POA: Diagnosis present

## 2021-04-10 DIAGNOSIS — E43 Unspecified severe protein-calorie malnutrition: Secondary | ICD-10-CM | POA: Diagnosis present

## 2021-04-10 DIAGNOSIS — A419 Sepsis, unspecified organism: Secondary | ICD-10-CM | POA: Diagnosis present

## 2021-04-10 DIAGNOSIS — I429 Cardiomyopathy, unspecified: Secondary | ICD-10-CM | POA: Diagnosis present

## 2021-04-10 DIAGNOSIS — E872 Acidosis, unspecified: Secondary | ICD-10-CM | POA: Diagnosis present

## 2021-04-10 DIAGNOSIS — I248 Other forms of acute ischemic heart disease: Secondary | ICD-10-CM | POA: Diagnosis present

## 2021-04-10 DIAGNOSIS — Z8616 Personal history of COVID-19: Secondary | ICD-10-CM | POA: Diagnosis not present

## 2021-04-10 DIAGNOSIS — I081 Rheumatic disorders of both mitral and tricuspid valves: Secondary | ICD-10-CM | POA: Diagnosis present

## 2021-04-10 DIAGNOSIS — E875 Hyperkalemia: Secondary | ICD-10-CM

## 2021-04-10 DIAGNOSIS — N2581 Secondary hyperparathyroidism of renal origin: Secondary | ICD-10-CM | POA: Diagnosis present

## 2021-04-10 DIAGNOSIS — I5043 Acute on chronic combined systolic (congestive) and diastolic (congestive) heart failure: Secondary | ICD-10-CM | POA: Diagnosis present

## 2021-04-10 DIAGNOSIS — N186 End stage renal disease: Secondary | ICD-10-CM | POA: Diagnosis present

## 2021-04-10 DIAGNOSIS — N189 Chronic kidney disease, unspecified: Secondary | ICD-10-CM | POA: Diagnosis not present

## 2021-04-10 DIAGNOSIS — R6521 Severe sepsis with septic shock: Secondary | ICD-10-CM | POA: Diagnosis present

## 2021-04-10 DIAGNOSIS — Z20822 Contact with and (suspected) exposure to covid-19: Secondary | ICD-10-CM | POA: Diagnosis present

## 2021-04-10 DIAGNOSIS — Z87820 Personal history of traumatic brain injury: Secondary | ICD-10-CM | POA: Diagnosis not present

## 2021-04-10 DIAGNOSIS — I132 Hypertensive heart and chronic kidney disease with heart failure and with stage 5 chronic kidney disease, or end stage renal disease: Secondary | ICD-10-CM | POA: Diagnosis present

## 2021-04-10 DIAGNOSIS — I69349 Monoplegia of lower limb following cerebral infarction affecting unspecified side: Secondary | ICD-10-CM | POA: Diagnosis not present

## 2021-04-10 DIAGNOSIS — R0602 Shortness of breath: Secondary | ICD-10-CM | POA: Diagnosis present

## 2021-04-10 DIAGNOSIS — E8779 Other fluid overload: Secondary | ICD-10-CM | POA: Diagnosis not present

## 2021-04-10 DIAGNOSIS — I472 Ventricular tachycardia, unspecified: Secondary | ICD-10-CM | POA: Diagnosis not present

## 2021-04-10 DIAGNOSIS — D631 Anemia in chronic kidney disease: Secondary | ICD-10-CM | POA: Diagnosis present

## 2021-04-10 DIAGNOSIS — Z992 Dependence on renal dialysis: Secondary | ICD-10-CM | POA: Diagnosis not present

## 2021-04-10 DIAGNOSIS — J189 Pneumonia, unspecified organism: Secondary | ICD-10-CM | POA: Diagnosis present

## 2021-04-10 DIAGNOSIS — I469 Cardiac arrest, cause unspecified: Secondary | ICD-10-CM | POA: Diagnosis not present

## 2021-04-10 LAB — BASIC METABOLIC PANEL
Anion gap: 15 (ref 5–15)
BUN: 87 mg/dL — ABNORMAL HIGH (ref 6–20)
CO2: 19 mmol/L — ABNORMAL LOW (ref 22–32)
Calcium: 8.5 mg/dL — ABNORMAL LOW (ref 8.9–10.3)
Chloride: 105 mmol/L (ref 98–111)
Creatinine, Ser: 9.1 mg/dL — ABNORMAL HIGH (ref 0.61–1.24)
GFR, Estimated: 6 mL/min — ABNORMAL LOW (ref >60)
Glucose, Bld: 82 mg/dL (ref 70–99)
Potassium: 6.2 mmol/L — ABNORMAL HIGH (ref 3.5–5.1)
Sodium: 139 mmol/L (ref 135–145)

## 2021-04-10 LAB — CBC
HCT: 36.2 % — ABNORMAL LOW (ref 39.0–52.0)
Hemoglobin: 11.5 g/dL — ABNORMAL LOW (ref 13.0–17.0)
MCH: 30.9 pg (ref 26.0–34.0)
MCHC: 31.8 g/dL (ref 30.0–36.0)
MCV: 97.3 fL (ref 80.0–100.0)
Platelets: 241 10*3/uL (ref 150–400)
RBC: 3.72 MIL/uL — ABNORMAL LOW (ref 4.22–5.81)
RDW: 16.9 % — ABNORMAL HIGH (ref 11.5–15.5)
WBC: 7.4 10*3/uL (ref 4.0–10.5)
nRBC: 0 % (ref 0.0–0.2)

## 2021-04-10 LAB — PROCALCITONIN: Procalcitonin: 3.03 ng/mL

## 2021-04-10 MED ORDER — MORPHINE SULFATE (PF) 2 MG/ML IV SOLN
2.0000 mg | INTRAVENOUS | Status: DC | PRN
  Administered 2021-04-10 – 2021-04-11 (×4): 2 mg via INTRAVENOUS
  Filled 2021-04-10 (×4): qty 1

## 2021-04-10 MED ORDER — CHLORHEXIDINE GLUCONATE CLOTH 2 % EX PADS
6.0000 | MEDICATED_PAD | Freq: Every day | CUTANEOUS | Status: DC
  Administered 2021-04-13 – 2021-04-19 (×7): 6 via TOPICAL
  Filled 2021-04-10: qty 6

## 2021-04-10 MED ORDER — PATIROMER SORBITEX CALCIUM 8.4 G PO PACK
16.8000 g | PACK | Freq: Every day | ORAL | Status: DC
  Administered 2021-04-11 – 2021-04-19 (×7): 16.8 g via ORAL
  Filled 2021-04-10 (×15): qty 2

## 2021-04-10 MED ORDER — AMLODIPINE BESYLATE 10 MG PO TABS
10.0000 mg | ORAL_TABLET | Freq: Every day | ORAL | Status: DC
  Administered 2021-04-11 – 2021-04-16 (×2): 10 mg via ORAL
  Filled 2021-04-10: qty 2
  Filled 2021-04-10 (×4): qty 1

## 2021-04-10 NOTE — Progress Notes (Signed)
[facility]  ROUNDING NOTE   Subjective:   Patient states he is feeling better.  K 6.2 - veltassa ordered.   Patient states he does not want dialysis today because he will have it again tomorrow. He rather wait until tomorrow.   Objective:  Vital signs in last 24 hours:  Temp:  [98 F (36.7 C)-98.8 F (37.1 C)] 98.8 F (37.1 C) (11/20 0900) Pulse Rate:  [86-101] 100 (11/20 0900) Resp:  [20-26] 22 (11/20 0900) BP: (130-167)/(101-117) 142/109 (11/20 0900) SpO2:  [92 %-100 %] 95 % (11/20 0900) Weight:  [68 kg] 68 kg (11/19 1530)  Weight change:  Filed Weights   04/09/21 1530  Weight: 68 kg    Intake/Output: I/O last 3 completed shifts: In: -  Out: 350 [Urine:350]   Intake/Output this shift:  No intake/output data recorded.  Physical Exam: General: Disheveled, laying on stretcher  Head: Normocephalic, atraumatic. Moist oral mucosal membranes  Eyes: Anicteric, PERRL  Neck: Supple, trachea midline  Lungs:  Clear to auscultation, 2 Li  O2  Heart: Regular rate and rhythm  Abdomen:  Soft, nontender, +urostomy tubes bilateral  Extremities:  no peripheral edema.  Neurologic: Nonfocal, moving all four extremities  Skin: No lesions  Access: Left IJ permcath    Basic Metabolic Panel: Recent Labs  Lab 04/09/21 1653 04/10/21 0746  NA 141 139  K 4.9 6.2*  CL 104 105  CO2 22 19*  GLUCOSE 86 82  BUN 79* 87*  CREATININE 8.68* 9.10*  CALCIUM 8.6* 8.5*  MG 2.3  --   PHOS 7.8*  --      Liver Function Tests: No results for input(s): AST, ALT, ALKPHOS, BILITOT, PROT, ALBUMIN in the last 168 hours. No results for input(s): LIPASE, AMYLASE in the last 168 hours. No results for input(s): AMMONIA in the last 168 hours.  CBC: Recent Labs  Lab 04/09/21 1653 04/10/21 0746  WBC 7.8 7.4  HGB 11.1* 11.5*  HCT 35.6* 36.2*  MCV 100.3* 97.3  PLT 233 241     Cardiac Enzymes: No results for input(s): CKTOTAL, CKMB, CKMBINDEX, TROPONINI in the last 168  hours.  BNP: Invalid input(s): POCBNP  CBG: No results for input(s): GLUCAP in the last 168 hours.  Microbiology: Results for orders placed or performed during the hospital encounter of 04/09/21  Resp Panel by RT-PCR (Flu A&B, Covid) Nasopharyngeal Swab     Status: None   Collection Time: 04/09/21  7:29 PM   Specimen: Nasopharyngeal Swab; Nasopharyngeal(NP) swabs in vial transport medium  Result Value Ref Range Status   SARS Coronavirus 2 by RT PCR NEGATIVE NEGATIVE Final    Comment: (NOTE) SARS-CoV-2 target nucleic acids are NOT DETECTED.  The SARS-CoV-2 RNA is generally detectable in upper respiratory specimens during the acute phase of infection. The lowest concentration of SARS-CoV-2 viral copies this assay can detect is 138 copies/mL. A negative result does not preclude SARS-Cov-2 infection and should not be used as the sole basis for treatment or other patient management decisions. A negative result may occur with  improper specimen collection/handling, submission of specimen other than nasopharyngeal swab, presence of viral mutation(s) within the areas targeted by this assay, and inadequate number of viral copies(<138 copies/mL). A negative result must be combined with clinical observations, patient history, and epidemiological information. The expected result is Negative.  Fact Sheet for Patients:  EntrepreneurPulse.com.au  Fact Sheet for Healthcare Providers:  IncredibleEmployment.be  This test is no t yet approved or cleared by the [place]  FDA and  has been authorized for detection and/or diagnosis of SARS-CoV-2 by FDA under an Emergency Use Authorization (EUA). This EUA will remain  in effect (meaning this test can be used) for the duration of the COVID-19 declaration under Section 564(b)(1) of the Act, 21 U.S.C.section 360bbb-3(b)(1), unless the authorization is terminated  or revoked sooner.       Influenza A by PCR  NEGATIVE NEGATIVE Final   Influenza B by PCR NEGATIVE NEGATIVE Final    Comment: (NOTE) The Xpert Xpress SARS-CoV-2/FLU/RSV plus assay is intended as an aid in the diagnosis of influenza from Nasopharyngeal swab specimens and should not be used as a sole basis for treatment. Nasal washings and aspirates are unacceptable for Xpert Xpress SARS-CoV-2/FLU/RSV testing.  Fact Sheet for Patients: EntrepreneurPulse.com.au  Fact Sheet for Healthcare Providers: IncredibleEmployment.be  This test is not yet approved or cleared by the [place] FDA and has been authorized for detection and/or diagnosis of SARS-CoV-2 by FDA under an Emergency Use Authorization (EUA). This EUA will remain in effect (meaning this test can be used) for the duration of the COVID-19 declaration under Section 564(b)(1) of the Act, 21 U.S.C. section 360bbb-3(b)(1), unless the authorization is terminated or revoked.  Performed at [facility], [address] [place], [place] [address]     Coagulation Studies: No results for input(s): LABPROT, INR in the last 72 hours.  Urinalysis: No results for input(s): COLORURINE, LABSPEC, PHURINE, GLUCOSEU, HGBUR, BILIRUBINUR, KETONESUR, PROTEINUR, UROBILINOGEN, NITRITE, LEUKOCYTESUR in the last 72 hours.  Invalid input(s): APPERANCEUR    Imaging: DG Chest 2 View  Result Date: 04/09/2021 CLINICAL DATA:  Pleural effusion, shortness of breath EXAM: CHEST - 2 VIEW COMPARISON:  Previous studies including the examination 03/01/2021 FINDINGS: Transverse diameter of heart is increased. Central pulmonary vessels are slightly less prominent. There is increased opacity in the left mid and left lower lung fields. Part of the homogeneous opacity in the left parahilar region may be due to fluid in the interlobar fissure. There is blunting of left lateral CP angle. Right lateral CP angle is clear. There is no pneumothorax. There is interval  placement of large caliber central venous catheter through the right jugular vein with its tip in the right atrium. There is a pigtail drainage catheter in the right upper quadrant of abdomen. IMPRESSION: Cardiomegaly. Increased homogeneous opacity is seen in the left mid and left lower lung fields suggesting increase in left pleural effusion and possibly underlying atelectasis/pneumonia. Central pulmonary vessels are slightly less prominent. There are no signs of alveolar pulmonary edema in the right lung. Electronically Signed   By: Elmer Picker M.D.   On: 04/09/2021 17:14     Medications:     carvedilol  3.125 mg Oral BID WC   feeding supplement  237 mL Oral Q24H   heparin  5,000 Units Subcutaneous Q8H   losartan  25 mg Oral Daily   multivitamin  1 tablet Oral QHS   sevelamer carbonate  800 mg Oral TID WC   acetaminophen **OR** acetaminophen, morphine injection, ondansetron **OR** ondansetron (ZOFRAN) IV  Assessment/ Plan:  [name] is a 50 y.o. black male with end stage renal disease on hemodialysis, learning disability, CVA with lower extremity residual weakness and wheel chair bound, obstructive uropathy with urostomy tubes, hypertension, who is admitted to [facility] on 04/09/2021 for Volume overload [E87.70]  [facility] MWF [facility] Left IJ permcath 51kg  End stage renal disease with hyperkalemia: missed treatment and now with hypertension  and pulmonary edema with pleural effusion. No acute indication for dialysis.  - Continue MWF schedule. Next treatment for tomorrow, Monday - Veltassa for hyperkalemia  Hypertension: patient admits to not taking his medications. Continues to be elevated, 138/110. Patient with hypertensive urgency on admission.  - Continue carvedilol and losartan - Start amlodipine 10mg  daily today.   Anemia with chronic kidney disease: hemoglobin 11.5. Macrocytic. Hold EPO. Not getting ESA as an outpatient currently  Secondary Hyperparathyroidism:  with hyperphosphatemia - Continue sevelamer with meals.    LOS: 0 Larry Morrison 11/20/202212:08 PM

## 2021-04-10 NOTE — ED Notes (Signed)
RN contacted dietary about pt needing Ensure.

## 2021-04-10 NOTE — Progress Notes (Signed)
Progress Note    Larry Morrison   NUU:[id number]  DOB: 1970-10-28  DOA: 04/09/2021     0 Date of Service: 04/10/2021   Clinical Course   50 y.o. male with medical history significant for end-stage renal disease on hemodialysis, frequent noncompliance with hemodialysis, hypertension, severe protein calorie malnutrition, elevated troponin, history of pleural effusion, anemia due to chronic kidney disease, tobacco use disorder, history of CVA, history of TBI, who presents emergency department for chief concerns of shortness of breath.  He is admitted for volume overload secondary to missed hemodialysis  11/20: Hyperkalemia with potassium of 6.2 getting Veltassa.  Patient refusing dialysis today   Assessment and Plan * Volume overload Secondary to missed dialysis.  Nephrology to decide need for dialysis.  Patient refusing dialysis today  Noncompliance of patient with renal dialysis [facility]) He is refusing dialysis today his potassium is high at 6.2 and getting Veltassa.  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  Patient has not been taking his Coreg or losartan since last discharge.  Protein-calorie malnutrition, severe Very poor prognosis.  Palliative care consult  Pleural effusion I would wait for him to get dialysis session and repeat chest x-ray to decide need for thoracentesis.  Patient has been very resistant to getting treatment including thoracentesis at this time  Anemia in ESRD (end-stage renal disease) ([place]) Hold EPO.  Hemoglobin 11.5.  Essential hypertension Patient has not been taking his blood pressure medicine at home.  Continue Coreg, Norvasc and losartan for now  ESRD (end stage renal disease) [facility]) Nephrology on board and following.  He is on MWF schedule.  Next HD treatment scheduled for tomorrow as patient is refusing 1  today     Subjective:  Refusing dialysis today.  He does not know to get thoracentesis.  Denies any diarrhea at this time.  He is not in a good mood to talk either  Objective Vitals:   04/10/21 0408 04/10/21 0527 04/10/21 0611 04/10/21 0900  BP: (!) 139/108 (!) 153/116 (!) 138/110 (!) 142/109  Pulse:  86 87 100  Resp:  20 (!) 22 (!) 22  Temp:    98.8 F (37.1 C)  TempSrc:    Oral  SpO2: 98% 98% 100% 95%  Weight:      Height:       68 kg.    Vital signs were reviewed and unremarkable except for: Blood pressure: Elevated    Exam Physical Exam   Constitutional: appears frail, cachectic, malnourished, NAD, calm, comfortable Eyes: PERRL, lids and conjunctivae normal ENMT: Mucous membranes are moist. Posterior pharynx clear of any exudate or lesions. Age-appropriate dentition. Hearing appropriate Neck: normal, supple, no masses, no thyromegaly Respiratory: clear to auscultation bilaterally, no wheezing, no crackles. Mild increased respiratory effort. No accessory muscle use.  Cardiovascular: Regular rate and rhythm, no murmurs / rubs / gallops. Mild lower extremity edema. 2+ pedal pulses. No carotid bruits.  Abdomen: no tenderness, Bowel sounds positive.  Urostomy tube bilaterally Musculoskeletal: no clubbing / cyanosis. Atrophic bilateral lower extremities.  Skin: no rashes, lesions, ulcers. No induration. Bilateral dry lower extremities. Neurologic: Sensation intact. Strength 5/5 in all 4.  Psychiatric:  Does not seem too happy being here and not very cooperative Access: Left IJ permacath  Labs / Other Information My review of labs, imaging, notes and other tests is significant for Hyperkalemia  Disposition Plan:  Status is: Inpatient  Remains inpatient appropriate because: Hypokalemic, will need dialysis tomorrow and decision for thoracentesis postdialysis due to pleural effusion.  Palliative care consult pending.   High risk for cardiorespiratory failure due to  missed dialysis and severe imbalance of electrolytes      Time spent: 35 minutes [facility] 04/10/2021, 12:48 PM

## 2021-04-10 NOTE — ED Notes (Signed)
Pt continues to refuse medication. Provider aware.

## 2021-04-10 NOTE — Assessment & Plan Note (Signed)
Nephrology on board and following.  He is on MWF schedule.  Next HD treatment scheduled for tomorrow as patient is refusing 1 today

## 2021-04-10 NOTE — Assessment & Plan Note (Signed)
Very poor prognosis.  Palliative care consult

## 2021-04-10 NOTE — Assessment & Plan Note (Signed)
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  Patient has not been taking his Coreg or losartan since last discharge.

## 2021-04-10 NOTE — Assessment & Plan Note (Signed)
Patient has not been taking his blood pressure medicine at home.  Continue Coreg, Norvasc and losartan for now

## 2021-04-10 NOTE — ED Notes (Signed)
Pt still refusing to wear heart monitor, says he has, "enough wires on his chest already". Pt continues to wear O2 and pulse ox. Refusing am Heparin

## 2021-04-10 NOTE — ED Notes (Signed)
RN attempted to admin meds to pt. Pt sts " I am not taking those meds." RN explained to pt that these meds will get him better to go home. Pt sts " I dont take medication." Pt asked RN to setup lunch tray for him as he is unable to assist himself. Pt than grabbed the fork and started to eat with no difficulties after RN set up lunch tray.

## 2021-04-10 NOTE — Hospital Course (Addendum)
50 y.o. male with medical history significant for end-stage renal disease on hemodialysis, frequent noncompliance with hemodialysis, hypertension, severe protein calorie malnutrition, elevated troponin, history of pleural effusion, anemia due to chronic kidney disease, tobacco use disorder, history of CVA, history of TBI, who presents emergency department for chief concerns of shortness of breath.  He is admitted for volume overload secondary to missed hemodialysis  11/20: Hyperkalemia with potassium of 6.2 getting Veltassa.  Patient refusing dialysis today 11/21: pt finally agreed to do HD, on HD become unresponsive and had PEA arrest. ROSC was achieved. Still on levephed.

## 2021-04-10 NOTE — Assessment & Plan Note (Signed)
Secondary to missed dialysis.  Nephrology to decide need for dialysis.  Patient refusing dialysis today

## 2021-04-10 NOTE — ED Notes (Signed)
Previous note of departure condition charted on wrong patient.

## 2021-04-10 NOTE — Assessment & Plan Note (Signed)
I would wait for him to get dialysis session and repeat chest x-ray to decide need for thoracentesis.  Patient has been very resistant to getting treatment including thoracentesis at this time

## 2021-04-10 NOTE — Assessment & Plan Note (Signed)
He is refusing dialysis today his potassium is high at 6.2 and getting Veltassa.  Nephrology to decide need for urgent dialysis

## 2021-04-10 NOTE — Assessment & Plan Note (Signed)
Hold EPO.  Hemoglobin 11.5.

## 2021-04-11 ENCOUNTER — Inpatient Hospital Stay: Payer: Medicaid Other

## 2021-04-11 ENCOUNTER — Other Ambulatory Visit: Payer: Self-pay

## 2021-04-11 DIAGNOSIS — E440 Moderate protein-calorie malnutrition: Secondary | ICD-10-CM | POA: Diagnosis present

## 2021-04-11 DIAGNOSIS — I469 Cardiac arrest, cause unspecified: Secondary | ICD-10-CM | POA: Diagnosis present

## 2021-04-11 DIAGNOSIS — I5043 Acute on chronic combined systolic (congestive) and diastolic (congestive) heart failure: Secondary | ICD-10-CM | POA: Diagnosis not present

## 2021-04-11 HISTORY — DX: Cardiac arrest, cause unspecified: I46.9

## 2021-04-11 LAB — CBC WITH DIFFERENTIAL/PLATELET
Abs Immature Granulocytes: 0.03 10*3/uL (ref 0.00–0.07)
Basophils Absolute: 0.1 10*3/uL (ref 0.0–0.1)
Basophils Relative: 1 %
Eosinophils Absolute: 0.3 10*3/uL (ref 0.0–0.5)
Eosinophils Relative: 5 %
HCT: 30.8 % — ABNORMAL LOW (ref 39.0–52.0)
Hemoglobin: 10 g/dL — ABNORMAL LOW (ref 13.0–17.0)
Immature Granulocytes: 1 %
Lymphocytes Relative: 16 %
Lymphs Abs: 1 10*3/uL (ref 0.7–4.0)
MCH: 31.3 pg (ref 26.0–34.0)
MCHC: 32.5 g/dL (ref 30.0–36.0)
MCV: 96.3 fL (ref 80.0–100.0)
Monocytes Absolute: 0.4 10*3/uL (ref 0.1–1.0)
Monocytes Relative: 6 %
Neutro Abs: 4.2 10*3/uL (ref 1.7–7.7)
Neutrophils Relative %: 71 %
Platelets: 237 10*3/uL (ref 150–400)
RBC: 3.2 MIL/uL — ABNORMAL LOW (ref 4.22–5.81)
RDW: 16.6 % — ABNORMAL HIGH (ref 11.5–15.5)
WBC: 5.9 10*3/uL (ref 4.0–10.5)
nRBC: 0 % (ref 0.0–0.2)

## 2021-04-11 LAB — PROTIME-INR
INR: 1.5 — ABNORMAL HIGH (ref 0.8–1.2)
Prothrombin Time: 18.4 seconds — ABNORMAL HIGH (ref 11.4–15.2)

## 2021-04-11 LAB — URINE DRUG SCREEN, QUALITATIVE (ARMC ONLY)
Amphetamines, Ur Screen: NOT DETECTED
Barbiturates, Ur Screen: NOT DETECTED
Benzodiazepine, Ur Scrn: NOT DETECTED
Cannabinoid 50 Ng, Ur ~~LOC~~: NOT DETECTED
Cocaine Metabolite,Ur ~~LOC~~: POSITIVE — AB
MDMA (Ecstasy)Ur Screen: NOT DETECTED
Methadone Scn, Ur: NOT DETECTED
Opiate, Ur Screen: POSITIVE — AB
Phencyclidine (PCP) Ur S: NOT DETECTED
Tricyclic, Ur Screen: NOT DETECTED

## 2021-04-11 LAB — RENAL FUNCTION PANEL
Albumin: 2.5 g/dL — ABNORMAL LOW (ref 3.5–5.0)
Anion gap: 16 — ABNORMAL HIGH (ref 5–15)
BUN: 100 mg/dL — ABNORMAL HIGH (ref 6–20)
CO2: 19 mmol/L — ABNORMAL LOW (ref 22–32)
Calcium: 8.4 mg/dL — ABNORMAL LOW (ref 8.9–10.3)
Chloride: 105 mmol/L (ref 98–111)
Creatinine, Ser: 10.31 mg/dL — ABNORMAL HIGH (ref 0.61–1.24)
GFR, Estimated: 6 mL/min — ABNORMAL LOW (ref >60)
Glucose, Bld: 122 mg/dL — ABNORMAL HIGH (ref 70–99)
Phosphorus: 9.6 mg/dL — ABNORMAL HIGH (ref 2.5–4.6)
Potassium: 6.1 mmol/L — ABNORMAL HIGH (ref 3.5–5.1)
Sodium: 140 mmol/L (ref 135–145)

## 2021-04-11 LAB — BLOOD GAS, VENOUS
Acid-Base Excess: 0.6 mmol/L (ref 0.0–2.0)
Bicarbonate: 24.5 mmol/L (ref 20.0–28.0)
FIO2: 0.8
MECHVT: 450 mL
O2 Saturation: 94.7 %
PEEP: 10 cmH2O
Patient temperature: 37
RATE: 20 resp/min
pCO2, Ven: 36 mmHg — ABNORMAL LOW (ref 44.0–60.0)
pH, Ven: 7.44 — ABNORMAL HIGH (ref 7.250–7.430)
pO2, Ven: 71 mmHg — ABNORMAL HIGH (ref 32.0–45.0)

## 2021-04-11 LAB — COMPREHENSIVE METABOLIC PANEL
ALT: 15 U/L (ref 0–44)
AST: 26 U/L (ref 15–41)
Albumin: 2.3 g/dL — ABNORMAL LOW (ref 3.5–5.0)
Alkaline Phosphatase: 105 U/L (ref 38–126)
Anion gap: 14 (ref 5–15)
BUN: 71 mg/dL — ABNORMAL HIGH (ref 6–20)
CO2: 21 mmol/L — ABNORMAL LOW (ref 22–32)
Calcium: 9 mg/dL (ref 8.9–10.3)
Chloride: 106 mmol/L (ref 98–111)
Creatinine, Ser: 7.45 mg/dL — ABNORMAL HIGH (ref 0.61–1.24)
GFR, Estimated: 8 mL/min — ABNORMAL LOW (ref >60)
Glucose, Bld: 92 mg/dL (ref 70–99)
Potassium: 4.7 mmol/L (ref 3.5–5.1)
Sodium: 141 mmol/L (ref 135–145)
Total Bilirubin: 1.3 mg/dL — ABNORMAL HIGH (ref 0.3–1.2)
Total Protein: 7.3 g/dL (ref 6.5–8.1)

## 2021-04-11 LAB — CBC
HCT: 32 % — ABNORMAL LOW (ref 39.0–52.0)
HCT: 29.2 % — ABNORMAL LOW (ref 39.0–52.0)
Hemoglobin: 10.3 g/dL — ABNORMAL LOW (ref 13.0–17.0)
Hemoglobin: 9 g/dL — ABNORMAL LOW (ref 13.0–17.0)
MCH: 31.1 pg (ref 26.0–34.0)
MCH: 30.3 pg (ref 26.0–34.0)
MCHC: 32.2 g/dL (ref 30.0–36.0)
MCHC: 30.8 g/dL (ref 30.0–36.0)
MCV: 96.7 fL (ref 80.0–100.0)
MCV: 98.3 fL (ref 80.0–100.0)
Platelets: 230 10*3/uL (ref 150–400)
Platelets: 189 10*3/uL (ref 150–400)
RBC: 3.31 MIL/uL — ABNORMAL LOW (ref 4.22–5.81)
RBC: 2.97 MIL/uL — ABNORMAL LOW (ref 4.22–5.81)
RDW: 16.8 % — ABNORMAL HIGH (ref 11.5–15.5)
RDW: 16.9 % — ABNORMAL HIGH (ref 11.5–15.5)
WBC: 6.4 10*3/uL (ref 4.0–10.5)
WBC: 9.1 10*3/uL (ref 4.0–10.5)
nRBC: 0 % (ref 0.0–0.2)
nRBC: 0 % (ref 0.0–0.2)

## 2021-04-11 LAB — TROPONIN I (HIGH SENSITIVITY)
Troponin I (High Sensitivity): 289 ng/L (ref <18)
Troponin I (High Sensitivity): 590 ng/L (ref <18)

## 2021-04-11 LAB — HEPATITIS B SURFACE ANTIGEN: Hepatitis B Surface Ag: NONREACTIVE

## 2021-04-11 LAB — PROCALCITONIN: Procalcitonin: 3.54 ng/mL

## 2021-04-11 LAB — APTT: aPTT: 42 seconds — ABNORMAL HIGH (ref 24–36)

## 2021-04-11 LAB — LACTIC ACID, PLASMA: Lactic Acid, Venous: 1.5 mmol/L (ref 0.5–1.9)

## 2021-04-11 LAB — GLUCOSE, CAPILLARY: Glucose-Capillary: 75 mg/dL (ref 70–99)

## 2021-04-11 LAB — HEPATITIS B SURFACE ANTIBODY,QUALITATIVE: Hep B S Ab: NONREACTIVE

## 2021-04-11 MED ORDER — CHLORHEXIDINE GLUCONATE 0.12% ORAL RINSE (MEDLINE KIT)
15.0000 mL | Freq: Two times a day (BID) | OROMUCOSAL | Status: DC
  Administered 2021-04-12 – 2021-04-18 (×11): 15 mL via OROMUCOSAL

## 2021-04-11 MED ORDER — SODIUM BICARBONATE 8.4 % IV SOLN: 50.0000 meq | Freq: Once | INTRAVENOUS | Status: DC

## 2021-04-11 MED ORDER — MIDAZOLAM HCL 2 MG/2ML IJ SOLN
INTRAMUSCULAR | Status: AC
  Administered 2021-04-11: 2 mg via INTRAVENOUS
  Filled 2021-04-11: qty 2

## 2021-04-11 MED ORDER — HEPARIN SODIUM (PORCINE) 1000 UNIT/ML DIALYSIS
20.0000 [IU]/kg | INTRAMUSCULAR | Status: DC | PRN
  Filled 2021-04-11: qty 2

## 2021-04-11 MED ORDER — STERILE WATER FOR INJECTION IV SOLN
INTRAVENOUS | Status: DC
  Filled 2021-04-11: qty 1000
  Filled 2021-04-11: qty 150

## 2021-04-11 MED ORDER — POLYETHYLENE GLYCOL 3350 17 G PO PACK
17.0000 g | PACK | Freq: Every day | ORAL | Status: DC
  Administered 2021-04-13 – 2021-04-16 (×4): 17 g
  Filled 2021-04-11 (×4): qty 1

## 2021-04-11 MED ORDER — PROSOURCE PLUS PO LIQD
30.0000 mL | Freq: Three times a day (TID) | ORAL | Status: DC
  Administered 2021-04-12: 30 mL via ORAL
  Filled 2021-04-11 (×7): qty 30

## 2021-04-11 MED ORDER — PROPOFOL 1000 MG/100ML IV EMUL
0.0000 ug/kg/min | INTRAVENOUS | Status: DC
Start: 2021-04-12 — End: 2021-04-16
  Administered 2021-04-11: 15 ug/kg/min via INTRAVENOUS
  Filled 2021-04-11: qty 100

## 2021-04-11 MED ORDER — ORAL CARE MOUTH RINSE
15.0000 mL | OROMUCOSAL | Status: DC
  Administered 2021-04-12 – 2021-04-18 (×50): 15 mL via OROMUCOSAL

## 2021-04-11 MED ORDER — NEPRO/CARBSTEADY PO LIQD: 237.0000 mL | Freq: Two times a day (BID) | ORAL | Status: DC

## 2021-04-11 MED ORDER — MIDAZOLAM HCL 2 MG/2ML IJ SOLN: 2.0000 mg | Freq: Once | INTRAMUSCULAR | Status: AC

## 2021-04-11 MED ORDER — DOCUSATE SODIUM 50 MG/5ML PO LIQD
100.0000 mg | Freq: Two times a day (BID) | ORAL | Status: DC
  Administered 2021-04-12 – 2021-04-16 (×7): 100 mg
  Filled 2021-04-11 (×8): qty 10

## 2021-04-11 MED ORDER — VANCOMYCIN HCL 1000 MG/200ML IV SOLN
1000.0000 mg | Freq: Once | INTRAVENOUS | Status: AC
  Administered 2021-04-11: 1000 mg via INTRAVENOUS
  Filled 2021-04-11: qty 200

## 2021-04-11 MED ORDER — SODIUM CHLORIDE 0.9 % IV SOLN
1.0000 g | Freq: Every day | INTRAVENOUS | Status: DC
Start: 2021-04-11 — End: 2021-04-12
  Administered 2021-04-11: 1 g via INTRAVENOUS
  Filled 2021-04-11 (×2): qty 1

## 2021-04-11 NOTE — Assessment & Plan Note (Signed)
Likely secondary to demand ischemia from volume overload.  As well as poor clearance due to ESRD status and lack of HD.  No chest pain.  Monitor.

## 2021-04-11 NOTE — Assessment & Plan Note (Signed)
He is refusing dialysis multiple times His potassium is high at 6.2 and getting Veltassa.

## 2021-04-11 NOTE — Progress Notes (Signed)
  Patient requested to be pulled up in bed, patient nervous about being touched, reassured movement would be done gently.  Patient's O2 sat's began to drop, HD nurse remained at bedside, while another staff member retrieved nasal cannula. Patient went into respiratory distress, O2 placed, rapid response was called.  Upon arrival to the suite rapid response managed care, Nephrologist, HD nurse and manager at bedside, patient was transferred to the ICU.

## 2021-04-11 NOTE — Assessment & Plan Note (Signed)
Patient has not been taking his blood pressure medicine at home.  Continue Coreg, Norvasc and losartan for now

## 2021-04-11 NOTE — Assessment & Plan Note (Signed)
At baseline.  Secondary to TBI.

## 2021-04-11 NOTE — Progress Notes (Signed)
Hemodialysis patient known at [place] MWF 11:00am, Patient rides [facility]. Patient known to miss treatments. Education provided, patient stated no dialysis concerns. Please contact me with any dialysis placement concerns.  Elvera Bicker Dialysis Coordinator [phone]

## 2021-04-11 NOTE — Assessment & Plan Note (Signed)
Hold EPO.  Hemoglobin 11.5.

## 2021-04-11 NOTE — Assessment & Plan Note (Signed)
Placing the patient at high risk for poor outcome.  Monitor.

## 2021-04-11 NOTE — Progress Notes (Signed)
Initial Nutrition Assessment  DOCUMENTATION CODES:   Non-severe (moderate) malnutrition in context of chronic illness  INTERVENTION:   -Renal MVI daily -Nepro Shake po BID, each supplement provides 425 kcal and 19 grams protein  -30 ml Prosource Plus TID, each supplement provides 100 kcals and 15 grams protein -D/c Ensure Enlive  NUTRITION DIAGNOSIS:   Moderate Malnutrition related to chronic illness (ESRD on HD) as evidenced by mild fat depletion, mild muscle depletion, moderate muscle depletion.  GOAL:   Patient will meet greater than or equal to 90% of their needs  MONITOR:   PO intake, Supplement acceptance, Labs, Weight trends, Skin, I & O's  REASON FOR ASSESSMENT:   Malnutrition Screening Tool    ASSESSMENT:   Larry Morrison is a 50 y.o. male with medical history significant for end-stage renal disease on hemodialysis, frequent noncompliance with hemodialysis, hypertension, severe protein calorie malnutrition, elevated troponin, history of pleural effusion, anemia due to chronic kidney disease, tobacco use disorder, history of CVA, history of TBI, who presents emergency department for chief concerns of shortness of breath.  Pt admitted with volume overload secondary to missed HD.   Reviewed I/O's: -200 ml x 24 hours and -550 ml since admission  UOP: 200 ml x 24 hours  Spoke with pt at bedside, who reports feeling better today. Observed lunch tray- pt consumed 100% of cake and a few bites of chicken. Noted that pt with very poor dentition, but refuses offer of mechanically altered diet. Noted meal completions 50%..    PTA, pt reports good appetite. He reports that he usually consumes 1-2 meals per day, which consist of either chicken or hot dogs.   Pt unsure of UBW or dry weight. Reviewed wt hx; wt has been stable over the past year.   Discussed importance of good meal and supplement intake to promote healing. Pt does not like Ensure, stating "it's the nastiest thing  I've ever had". He is amenable to Nepro.   Medications reviewed and include veltassa and renvela.   Labs reviewed: K: 6.2. Phos: 7.8.   NUTRITION - FOCUSED PHYSICAL EXAM:  Flowsheet Row Most Recent Value  Orbital Region Mild depletion  Upper Arm Region No depletion  Thoracic and Lumbar Region Mild depletion  Buccal Region Mild depletion  Temple Region Mild depletion  Clavicle Bone Region Moderate depletion  Clavicle and Acromion Bone Region Moderate depletion  Scapular Bone Region Moderate depletion  Dorsal Hand Mild depletion  Patellar Region Severe depletion  Anterior Thigh Region Severe depletion  Posterior Calf Region Severe depletion  Edema (RD Assessment) None  Hair Reviewed  Eyes Reviewed  Mouth Reviewed  Skin Reviewed  Nails Reviewed       Diet Order:   Diet Order             Diet renal with fluid restriction Fluid restriction: 1200 mL Fluid; Room service appropriate? Yes; Fluid consistency: Thin  Diet effective now                   EDUCATION NEEDS:   Education needs have been addressed  Skin:  Skin Assessment: Reviewed RN Assessment  Last BM:  Unknown  Height:   Ht Readings from Last 1 Encounters:  04/09/21 5\' 2"  (1.575 m)    Weight:   Wt Readings from Last 1 Encounters:  04/11/21 49.1 kg    Ideal Body Weight:  53.6 kg  BMI:  Body mass index is 19.8 kg/m.  Estimated Nutritional Needs:   Kcal:  1500-1700  Protein:  85-100 grams  Fluid:  1000 ml + UOP    [name], RD, LDN, [place] Registered Dietitian II Certified Diabetes Care and Education Specialist Please refer to [facility] for RD and/or RD on-call/weekend/after hours pager

## 2021-04-11 NOTE — Assessment & Plan Note (Addendum)
Nephrology on board and following.  He is on MWF schedule.

## 2021-04-11 NOTE — Progress Notes (Signed)
[facility]  ROUNDING NOTE   Subjective:   Doing fair this morning. States that he ate his breakfast Denies any acute complaints No shortness of breath Did not want anyone to touch his legs because of tenderness  Objective:  Vital signs in last 24 hours:  Temp:  [97.4 F (36.3 C)-97.8 F (36.6 C)] 97.8 F (36.6 C) (11/21 1139) Pulse Rate:  [70-87] 70 (11/21 1139) Resp:  [18] 18 (11/21 1139) BP: (109-146)/(81-129) 117/84 (11/21 1139) SpO2:  [89 %-100 %] 99 % (11/21 1139)  Weight change:  Filed Weights   04/09/21 1530  Weight: 68 kg    Intake/Output: I/O last 3 completed shifts: In: -  Out: 550 [Urine:550]   Intake/Output this shift:  Total I/O In: 600 [P.O.:600] Out: -   Physical Exam: General: Disheveled, laying in the bed  Head: Normocephalic, atraumatic. Moist oral mucosal membranes  Neck: Supple, trachea midline  Lungs:  Bilateral crackles, not wearing his oxygen  Heart: Regular rate and rhythm  Abdomen:  Soft, nontender,  Extremities:  no peripheral edema.  Neurologic: Nonfocal, moving all four extremities  Skin: No lesions  Access: Right IJ permcath    Basic Metabolic Panel: Recent Labs  Lab 04/09/21 1653 04/10/21 0746  NA 141 139  K 4.9 6.2*  CL 104 105  CO2 22 19*  GLUCOSE 86 82  BUN 79* 87*  CREATININE 8.68* 9.10*  CALCIUM 8.6* 8.5*  MG 2.3  --   PHOS 7.8*  --      Liver Function Tests: No results for input(s): AST, ALT, ALKPHOS, BILITOT, PROT, ALBUMIN in the last 168 hours. No results for input(s): LIPASE, AMYLASE in the last 168 hours. No results for input(s): AMMONIA in the last 168 hours.  CBC: Recent Labs  Lab 04/09/21 1653 04/10/21 0746 04/11/21 0347  WBC 7.8 7.4 6.4  HGB 11.1* 11.5* 10.3*  HCT 35.6* 36.2* 32.0*  MCV 100.3* 97.3 96.7  PLT 233 241 230     Cardiac Enzymes: No results for input(s): CKTOTAL, CKMB, CKMBINDEX, TROPONINI in the last 168 hours.  BNP: Invalid input(s): POCBNP  CBG: No  results for input(s): GLUCAP in the last 168 hours.  Microbiology: Results for orders placed or performed during the hospital encounter of 04/09/21  Resp Panel by RT-PCR (Flu A&B, Covid) Nasopharyngeal Swab     Status: None   Collection Time: 04/09/21  7:29 PM   Specimen: Nasopharyngeal Swab; Nasopharyngeal(NP) swabs in vial transport medium  Result Value Ref Range Status   SARS Coronavirus 2 by RT PCR NEGATIVE NEGATIVE Final    Comment: (NOTE) SARS-CoV-2 target nucleic acids are NOT DETECTED.  The SARS-CoV-2 RNA is generally detectable in upper respiratory specimens during the acute phase of infection. The lowest concentration of SARS-CoV-2 viral copies this assay can detect is 138 copies/mL. A negative result does not preclude SARS-Cov-2 infection and should not be used as the sole basis for treatment or other patient management decisions. A negative result may occur with  improper specimen collection/handling, submission of specimen other than nasopharyngeal swab, presence of viral mutation(s) within the areas targeted by this assay, and inadequate number of viral copies(<138 copies/mL). A negative result must be combined with clinical observations, patient history, and epidemiological information. The expected result is Negative.  Fact Sheet for Patients:  EntrepreneurPulse.com.au  Fact Sheet for Healthcare Providers:  IncredibleEmployment.be  This test is no t yet approved or cleared by the [place] FDA and  has been authorized for detection and/or diagnosis  of SARS-CoV-2 by FDA under an Emergency Use Authorization (EUA). This EUA will remain  in effect (meaning this test can be used) for the duration of the COVID-19 declaration under Section 564(b)(1) of the Act, 21 U.S.C.section 360bbb-3(b)(1), unless the authorization is terminated  or revoked sooner.       Influenza A by PCR NEGATIVE NEGATIVE Final   Influenza B by PCR  NEGATIVE NEGATIVE Final    Comment: (NOTE) The Xpert Xpress SARS-CoV-2/FLU/RSV plus assay is intended as an aid in the diagnosis of influenza from Nasopharyngeal swab specimens and should not be used as a sole basis for treatment. Nasal washings and aspirates are unacceptable for Xpert Xpress SARS-CoV-2/FLU/RSV testing.  Fact Sheet for Patients: EntrepreneurPulse.com.au  Fact Sheet for Healthcare Providers: IncredibleEmployment.be  This test is not yet approved or cleared by the [place] FDA and has been authorized for detection and/or diagnosis of SARS-CoV-2 by FDA under an Emergency Use Authorization (EUA). This EUA will remain in effect (meaning this test can be used) for the duration of the COVID-19 declaration under Section 564(b)(1) of the Act, 21 U.S.C. section 360bbb-3(b)(1), unless the authorization is terminated or revoked.  Performed at [facility], [address] [place], [place] [address]     Coagulation Studies: No results for input(s): LABPROT, INR in the last 72 hours.  Urinalysis: No results for input(s): COLORURINE, LABSPEC, PHURINE, GLUCOSEU, HGBUR, BILIRUBINUR, KETONESUR, PROTEINUR, UROBILINOGEN, NITRITE, LEUKOCYTESUR in the last 72 hours.  Invalid input(s): APPERANCEUR    Imaging: DG Chest 2 View  Result Date: 04/09/2021 CLINICAL DATA:  Pleural effusion, shortness of breath EXAM: CHEST - 2 VIEW COMPARISON:  Previous studies including the examination 03/01/2021 FINDINGS: Transverse diameter of heart is increased. Central pulmonary vessels are slightly less prominent. There is increased opacity in the left mid and left lower lung fields. Part of the homogeneous opacity in the left parahilar region may be due to fluid in the interlobar fissure. There is blunting of left lateral CP angle. Right lateral CP angle is clear. There is no pneumothorax. There is interval placement of large caliber central venous  catheter through the right jugular vein with its tip in the right atrium. There is a pigtail drainage catheter in the right upper quadrant of abdomen. IMPRESSION: Cardiomegaly. Increased homogeneous opacity is seen in the left mid and left lower lung fields suggesting increase in left pleural effusion and possibly underlying atelectasis/pneumonia. Central pulmonary vessels are slightly less prominent. There are no signs of alveolar pulmonary edema in the right lung. Electronically Signed   By: Elmer Picker M.D.   On: 04/09/2021 17:14     Medications:     amLODipine  10 mg Oral Daily   carvedilol  3.125 mg Oral BID WC   Chlorhexidine Gluconate Cloth  6 each Topical Q0600   feeding supplement  237 mL Oral Q24H   heparin  5,000 Units Subcutaneous Q8H   multivitamin  1 tablet Oral QHS   patiromer  16.8 g Oral Daily   sevelamer carbonate  800 mg Oral TID WC   acetaminophen **OR** acetaminophen, morphine injection, ondansetron **OR** ondansetron (ZOFRAN) IV  Assessment/ Plan:  [name] is a 50 y.o. 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 No acute indication for dialysis.  - Continue MWF schedule.  - 2 K dialysate   Volume overload Expected to improve with HD  Anemia with chronic kidney disease: hemoglobin 10.3.   Secondary Hyperparathyroidism: with hyperphosphatemia - Continue sevelamer with meals.    LOS: 1 Larry Morrison 11/21/202211:50 AM

## 2021-04-11 NOTE — Consult Note (Signed)
Pharmacy Antibiotic Note  Larry Morrison is a 50 y.o. male with medical history including ESRD on HD, HTN, malnutrition, history of CVA, history of TBI admitted on 04/09/2021 with  shortness of breath / volume overload secondary to missed hemodialysis . Hospital course complicated by PEA arrest during HD on 11/21. Pharmacy has been consulted for vancomycin and cefepime dosing.  Plan:  Cefepime 1 g IV q24h --Consideration can be given to 2 g IV qHD if consistent HD schedule established  Vancomycin 1 g IV x 1 --Further dosing dependent on HD schedule  Height: 5' 2.01" (157.5 cm) Weight: 49.1 kg (108 lb 3.9 oz) IBW/kg (Calculated) : 54.62  Temp (24hrs), Avg:97.1 F (36.2 C), Min:95.2 F (35.1 C), Max:97.8 F (36.6 C)  Recent Labs  Lab 04/09/21 1653 04/10/21 0746 04/11/21 0347 04/11/21 1449 04/11/21 1930  WBC 7.8 7.4 6.4 5.9 9.1  CREATININE 8.68* 9.10*  --  10.31* 7.45*    Estimated Creatinine Clearance: 8.1 mL/min (A) (by C-G formula based on SCr of 7.45 mg/dL (H)).    Allergies  Allergen Reactions   Vancomycin     Patient denies - repeated denial to pharm tech 09-05-2020    Antimicrobials this admission: Cefepime 11/21 >>  Vancomycin 11/21 >>   Dose adjustments this admission: N/A  Microbiology results: 11/21 BCx: pending 11/21 Sputum: pending  11/21 MRSA PCR: pending  Thank you for allowing pharmacy to be a part of this patient's care.  Benita Gutter 04/11/2021 10:18 PM

## 2021-04-11 NOTE — Procedures (Signed)
Endotracheal Intubation: Patient required placement of an artificial airway secondary to Respiratory Failure  Consent: Emergent.   Hand washing performed prior to starting the procedure.   Medications administered for sedation prior to procedure:  Rocuronium 40 mg IV, Fentanyl 100 mcg IV.    A time out procedure was called and correct patient, name, & ID confirmed. Needed supplies and equipment were assembled and checked to include ETT, 10 ml syringe, Glidescope, Mac and Miller blades, suction, oxygen and bag mask valve, end tidal CO2 monitor.   Patient was positioned to align the mouth and pharynx to facilitate visualization of the glottis.   Heart rate, SpO2 and blood pressure was continuously monitored during the procedure. Pre-oxygenation was conducted prior to intubation and endotracheal tube was placed through the vocal cords into the trachea.     The artificial airway was placed under direct visualization via glidescope route using a 7.50 ETT on the first attempt.  ETT was secured at 23 cm mark.  Placement was confirmed by auscuitation of lungs with good breath sounds bilaterally and no stomach sounds.  Condensation was noted on endotracheal tube.   Pulse ox 98%.  CO2 detector in place with appropriate color change.   Complications: None .    Chest radiograph ordered and pending.   Comments: OGT placed via glidescope.   Ottie Glazier, M.D.  Pulmonary & [place]

## 2021-04-11 NOTE — Progress Notes (Signed)
Rapid Response Event Note   Reason for Call : Respiratory distress/code blue   Initial Focused Assessment: On arrival pt is unresponsive and Dr. Lanney Gins at bedside wanting to emergently intubate pt. Before intubation, pt is asystole and code blue called.    Interventions: Pt successfully intubated. Shocked once. ROSC obtained. Pt transferred to ICU 6.   Plan of Care: Pt now in room ICU 6. Darlyn Chamber, NP at bedside placing central line.   Event Summary:   MD Notified: Dr. Lanney Gins Call Time: 1324 Arrival Time: 1713 End Time: Pt transferred to ICU 6  Trellis Paganini, RN

## 2021-04-11 NOTE — Assessment & Plan Note (Addendum)
Secondary to missed dialysis. Nephrology consulted. Patient has been refusing hemodialysis here in the hospital as well. Finally agreeable for HD.  Although unable to complete the full session on 11/21.Larry Morrison

## 2021-04-11 NOTE — Assessment & Plan Note (Signed)
Initial plan was to get through HD and follow-up chest x-ray after that to see whether he requires any intervention or not. Currently patient had a cardiac arrest and suspect that his pleural effusion will be worsening.

## 2021-04-11 NOTE — Progress Notes (Signed)
After reviewing the patient's medical records, epic notes, labs, and images, I attempted to assess and speak with the patient at bedside.  Patient was sleeping but was easily arousable.  He asked that I not awaken him.    I introduced palliative medicine as an extra layer of support to manage his symptoms as well as discuss goals of care.  He said that that "all sounded fine" but that he did not want to talk to me right now.  He rather be left alone and be allowed to sleep.  Asked if I could return tomorrow for a visit he said he thought that might be okay.  I asked if I can touch base and speak with his sister and mother.  He said no.  I am on service and will attempt to speak with the patient again tomorrow.  PMT will continue to monitor the patient throughout his hospitalization.  [place] Ilsa Iha, FNP-BC Palliative Medicine Team Team Phone # [phone]   NO CHARGE

## 2021-04-11 NOTE — Assessment & Plan Note (Signed)
On 11/21.  After 1 hour in HD patient become unresponsive and likely had respiratory arrest which admitted into cardiac arrest requiring CPR, shock.  Transferred to ICU after that.  Prognosis very poor after this event.  Care transferred to ICU.  I have notified mother.

## 2021-04-11 NOTE — Assessment & Plan Note (Signed)
Potassium level 6.2.  Due to refusing HD.  Most likely causing cardiac arrest.

## 2021-04-11 NOTE — Plan of Care (Signed)
New admit patient refused scheduled  medications, uncooperative, angry, coursing the nurse when trying to do assessment. Unable to complete admission assessment. Patient refused to answer any question.  Given pain medication twice. No BM, urostomy bag output 200 ml. VS stable.     Problem: Education: Goal: Knowledge of General Education information will improve Description: Including pain rating scale, medication(s)/side effects and non-pharmacologic comfort measures Outcome: Progressing   Problem: Clinical Measurements: Goal: Ability to maintain clinical measurements within normal limits will improve Outcome: Progressing Goal: Will remain free from infection Outcome: Progressing Goal: Diagnostic test results will improve Outcome: Progressing Goal: Respiratory complications will improve Outcome: Progressing Goal: Cardiovascular complication will be avoided Outcome: Progressing

## 2021-04-11 NOTE — Consult Note (Addendum)
NAME:  Larry Morrison, MRN:  [id number], DOB:  03-03-1971, LOS: 1 ADMISSION DATE:  04/09/2021, CONSULTATION DATE:  04/11/21 REFERRING MD:  Berle Mull, MD  CHIEF COMPLAINT:  Cardiac Arrest   HPI  50 y.o with significant PMH of ESRD on HD who presented to the ED after an accidental fall at home due to falling asleep in his wheelchair.  ED Course: On arrival to the ED, he was afebrile with blood pressure  mm Hg and pulse rate  beats/min. There were no focal neurological deficits;  Pertinent Labs in Red/Diagnostics Findings: Na+/ K+: 141/4.9 Glucose: 86 BUN/Cr: 79/8.68 Calcium: 8.6  WBC/ TMAX: 7.8/ afebrile Hgb/Hct: 11.1/35.6 Plts: 233 PCT:2.88 COVID PCR: Negative   Troponin: 106 BNP: >4500 EKG: Normal sinus rhythm, right axis deviation, prolonged QT interval, no acute ischemic changes CXR: Increase in left pleural effusion and possibly underlying atelectasis/pneumonia  Patient was admitted to [facility] unit under hospitalist service for volume overload in the setting of  pulmonary congestion, pleural effusion and severe combined heart failure.  Apparently pt missed  HD Session on 04/08/2021 and had incomplete session on 04/06/21. Patient received IV Lasix 80 mg x 1 and nephrology consulted.  Hospital Course: During the course of this admission, patient was evaluated by nephrology who felt there was no indication for dialysis at that time and resume his HD schedule MWF.  Patient was also started on his home meds carvedilol, Lasix and losartan for hypertensive urgency.  Patient  was noted with hyperkalemia  and required HD on 11/20 but he decline the session. He did finally agreed to HD today but was unable to complete the full session.  After an hour in HD, patient became unresponsive and went into pulseless V. tach and asystole  requiring CPR and defibrillation.  Patient intubated post ROSC and transferred to the ICU.  PCCM consulted.  Past Medical History   Cardiomyopathy     Protein-calorie malnutrition, severe 03/01/2021  Arteriovenous fistula occlusion (HCC)    Pleural effusion 02/28/2021  Arteriovenous fistula thrombosis (HCC) 32/67/1245  Metabolic acidosis 80/99/8338  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 02/28/2021  History of traumatic brain injury 09/07/2020  Cognitive impairment 25/09/3974  Metabolic encephalopathy 73/41/9379  ESRD needing dialysis ([place]) 09/04/2020  Hypoglycemia 09/04/2020  Uremia 09/04/2020  COVID-19 virus infection 06/18/2020  Stroke ([place]) 06/18/2020  Thrombocytopenia ([place]) 06/18/2020  Elevated troponin 06/18/2020  Hyperkalemia 06/13/2020  Anemia in ESRD (end-stage renal disease) ([place]) 09/17/2019  Essential hypertension 07/15/2019  Testicular torsion    Pyuria 06/10/2016  Chronic pain following surgery or procedure 12/01/2015  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 11/20: Hyperkalemia with potassium of 6.2 getting Veltassa.  Patient refusing dialysis today. 11/21: Patient went into PEA Cardiac arrest during HD session, intubated post ROSC and transferred to ICU. PCCM consulted   Consults:  Nephrology PCCM  Procedures:  11/21: intubation 11/21: Right Femoral vein central line  Significant Diagnostic Tests:  11/21: Chest Xray>Progressive left basilar consolidation and left effusion. 11/21: Abdominal xray>Enteric catheter tip and side port projecting over the gastric body. Bowel gas pattern is unremarkable. Persistent left basilar consolidation and effusion. 11/21: Noncontrast  CT head> 11/19: Chest Xray>Cardiomegaly. Increased homogeneous opacity is seen in the left mid and left lower lung fields suggesting increase in left pleural effusion and possibly underlying atelectasis/pneumonia. Central pulmonary vessels are slightly less prominent. There are no signs of alveolar pulmonary edema in the right lung.    Micro Data:  11/19: SARS-CoV-2 PCR> negative 11/19: Influenza PCR> negative 11/21: Blood culture x2> 11/21: Urine Culture> 11/21: Tracheal aspirate> 11/21: MRSA PCR>>  11/21: Strep pneumo urinary antigen> 11/21: Legionella urinary antigen>  Antimicrobials:  Vancomycin 11/21> Cefepime 11/21>  OBJECTIVE  Blood pressure (!) 113/98, pulse 65, temperature (!) 95.2 F (35.1 C), temperature source Oral, resp. rate (!) 22, height 5\' 2"  (1.575 m), weight 49.1 kg, SpO2 97 %.    Vent Mode: PRVC FiO2 (%):  [100 %] 100 % Set Rate:  [20 bmp] 20 bmp Vt Set:  [450 mL] 450 mL PEEP:  [10 cmH20] 10 cmH20   Intake/Output Summary (Last 24 hours) at 04/11/2021 2052 Last data filed at 04/11/2021 2000 Gross per 24 hour  Intake 600 ml  Output 280 ml  Net 320 ml   Filed Weights   04/09/21 1530 04/11/21 1525  Weight: 68 kg 49.1 kg   Physical Examination  GENERAL: 50 year-old critically ill patient lying in the bed intubated, mechanically ventilated and sedated EYES: Pupils equal, round, reactive to light and accommodation. No scleral icterus. Extraocular muscles intact.  HEENT: Head atraumatic, normocephalic. Oropharynx and nasopharynx clear.  NECK:  Supple, no jugular venous distention. No thyroid enlargement, no tenderness.  LUNGS: Decreased breath sounds bilaterally, no wheezing, rales, moderate rhonchi. No crepitation. No use of accessory muscles of respiration.  CARDIOVASCULAR: S1, S2 normal. No murmurs, rubs, or gallops.  ABDOMEN: Soft, nontender, nondistended. Bowel sounds present. No organomegaly or mass.  Nephrostomy tube in place draining yellow  urine EXTREMITIES: No pedal edema, cyanosis, or clubbing.  NEUROLOGIC: Cranial nerves II through XII are intact.  Muscle strength NOT checked. Sensation intact. Gait not checked.  PSYCHIATRIC: The patient is intubated and sedated SKIN: No obvious rash, lesion, or ulcer.   Labs/imaging that I havepersonally reviewed  (right click and "Reselect all SmartList Selections" daily)     Labs   CBC: Recent Labs  Lab 04/09/21 1653 04/10/21 0746 04/11/21 0347 04/11/21 1449 04/11/21 1930  WBC 7.8 7.4 6.4 5.9 9.1  NEUTROABS  --   --   --  4.2  --   HGB 11.1* 11.5* 10.3* 10.0* 9.0*  HCT 35.6* 36.2* 32.0* 30.8* 29.2*  MCV 100.3* 97.3 96.7 96.3 98.3  PLT 233 241 230 237 884    Basic Metabolic Panel: Recent Labs  Lab 04/09/21 1653 04/10/21 0746 04/11/21 1449 04/11/21 1930  NA 141 139 140 141  K 4.9 6.2* 6.1* 4.7  CL 104 105 105 106  CO2 22 19* 19* 21*  GLUCOSE 86 82 122* 92  BUN 79* 87* 100* 71*  CREATININE 8.68* 9.10* 10.31* 7.45*  CALCIUM 8.6* 8.5* 8.4* 9.0  MG 2.3  --   --   --   PHOS 7.8*  --  9.6*  --    GFR: Estimated Creatinine Clearance: 8.1 mL/min (A) (by C-G formula based on SCr of 7.45 mg/dL (H)). Recent Labs  Lab 04/09/21 1844 04/10/21 0746 04/11/21 0347 04/11/21 1449 04/11/21 1930  PROCALCITON 2.88 3.03 3.54  --   --   WBC  --  7.4 6.4 5.9 9.1    Liver Function Tests: Recent Labs  Lab 04/11/21 1449 04/11/21 1930  AST  --  26  ALT  --  15  ALKPHOS  --  105  BILITOT  --  1.3*  PROT  --  7.3  ALBUMIN 2.5* 2.3*   No results for input(s): LIPASE, AMYLASE in the last 168 hours. No results for input(s): AMMONIA in the last 168 hours.  ABG    Component Value Date/Time   PHART 7.16 (LL) 04/11/2021 1740   PCO2ART 53 (H) 04/11/2021 1740   PO2ART 51 (L) 04/11/2021 1740   HCO3 18.9 (L) 04/11/2021 1740   TCO2 27 08/07/2019 1018   ACIDBASEDEF 9.7 (H) 04/11/2021 1740   O2SAT 73.8 04/11/2021 1740     Coagulation Profile: Recent Labs  Lab  04/11/21 1930  INR 1.5*    Cardiac Enzymes: No results for input(s): CKTOTAL, CKMB, CKMBINDEX, TROPONINI in the last 168 hours.  HbA1C: Hgb A1c MFr Bld  Date/Time Value Ref Range Status  09/05/2020 05:00 AM 5.0 4.8 - 5.6 % Final    Comment:    (NOTE)         Prediabetes: 5.7 - 6.4         Diabetes: >6.4         Glycemic control for adults with diabetes: <7.0     CBG: Recent Labs  Lab 04/11/21 1755  GLUCAP 75    Review of Systems:   UNABLE TO ASSESS PATIENT IS INTUBATED AND SEDATED  Past Medical History  He,  has a past medical history of Anemia, Aneurysm ([place]), Dialysis patient ([place]), Dyspnea, Foot drop, History of kidney stones, History of nephrostomy, Hypertension, Renal disorder, and Stroke ([place]).   Surgical History    Past Surgical History:  Procedure Laterality Date   A/V SHUNT INTERVENTION N/A 03/01/2021   Procedure: A/V SHUNT INTERVENTION;  Surgeon: Katha Cabal, MD;  Location: [facility];  Service: Cardiovascular;  Laterality: N/A;   AV FISTULA PLACEMENT Right 08/07/2019   Procedure: ARTERIOVENOUS (AV) FISTULA CREATION;  Surgeon: Algernon Huxley, MD;  Location: [facility];  Service: Vascular;  Laterality: Right;   AV FISTULA PLACEMENT Right 11/20/2019   Procedure: INSERTION OF ARTERIOVENOUS (AV) GORE-TEX GRAFT ARM;  Surgeon: Algernon Huxley, MD;  Location: [facility];  Service: Vascular;  Laterality: Right;   IR NEPHROSTOMY EXCHANGE RIGHT  09/10/2020   NEPHRECTOMY Left    NEPHRECTOMY     ORCHIECTOMY Right 10/27/2016   Procedure: PSB ORCHIECTOMY;  Surgeon: Hollice Espy, MD;  Location: [facility];  Service: Urology;  Laterality: Right;   ORCHIOPEXY Bilateral 10/27/2016   Procedure: ORCHIOPEXY ADULT;  Surgeon: Hollice Espy, MD;  Location: [facility];  Service: Urology;  Laterality: Bilateral;   SCROTAL EXPLORATION Bilateral 10/27/2016   Procedure: SCROTUM EXPLORATION;  Surgeon: Hollice Espy, MD;  Location: [facility];  Service: Urology;  Laterality: Bilateral;      Social History   reports that he has quit smoking. His smoking use included cigarettes. He smoked an average of .5 packs per day. He has never used smokeless tobacco. He reports current drug use. Drug: Marijuana. He reports that he does not drink alcohol.   Family History   His Family history is unknown by patient.   Allergies Allergies  Allergen Reactions   Vancomycin     Patient denies - repeated denial to pharm tech 09-05-2020     Home Medications  Prior to Admission medications   Medication Sig Start Date End Date Taking? Authorizing Provider  aspirin EC 81 MG EC tablet Take 1 tablet (81 mg total) by mouth daily. Swallow  whole. 03/04/21   Enzo Bi, MD  carvedilol (COREG) 3.125 MG tablet Take 1 tablet (3.125 mg total) by mouth 2 (two) times daily with a meal. Patient not taking: Reported on 04/09/2021 03/03/21 06/01/21  Enzo Bi, MD  feeding supplement (ENSURE ENLIVE / ENSURE PLUS) LIQD Take 237 mLs by mouth daily. Patient not taking: Reported on 04/09/2021 03/03/21   Enzo Bi, MD  losartan (COZAAR) 25 MG tablet Take 1 tablet (25 mg total) by mouth daily. Patient not taking: Reported on 04/09/2021 03/04/21 06/02/21  Enzo Bi, MD  multivitamin (RENA-VIT) TABS tablet Take 1 tablet by mouth at bedtime. Patient not taking: Reported on 04/09/2021 03/03/21   Enzo Bi, MD  sevelamer carbonate (RENVELA) 800 MG tablet Take 1 tablet (800 mg total) by mouth 3 (three) times daily with meals. Patient not taking: Reported on 04/09/2021 03/03/21 06/01/21  Enzo Bi, MD    Scheduled Meds:  (feeding supplement) PROSource Plus  30 mL Oral TID BM   amLODipine  10 mg Oral Daily   carvedilol  3.125 mg Oral BID WC   Chlorhexidine Gluconate Cloth  6 each Topical Q0600   feeding supplement (NEPRO CARB STEADY)  237 mL Oral BID BM   heparin  5,000 Units Subcutaneous Q8H   multivitamin  1 tablet Oral QHS   patiromer  16.8 g Oral Daily   sevelamer carbonate  800 mg Oral TID WC   sodium  bicarbonate  50 mEq Intravenous Once   Continuous Infusions:   sodium bicarbonate (isotonic) infusion in sterile water 75 mL/hr at 04/11/21 1936   PRN Meds:.acetaminophen **OR** acetaminophen, ondansetron **OR** ondansetron (ZOFRAN) IV   Active Hospital Problem list   Acute hypoxic respiratory failure Sepsis   Assessment & Plan:  Acute Hypoxic Respiratory Failure in the setting of Pulmonary Edema with Pleural Effusion, Combined severe CHF and probable Pneumonia PMHx: ESRD noncompliance with hemodialysis treatment. -Continue ventilator support & lung protective strategies -Wean PEEP & FiO2 as tolerated, maintain SpO2 > 90% -Head of bed elevated 30 degrees, VAP protocol in place -Plateau pressures less than 30 cm H20  -Intermittent chest x-ray & ABG PRN -Daily WUA with SBT as tolerated  -Ensure adequate pulmonary hygiene  -HD for fluid removal -Budesonide inhaler nebs BID, bronchodilators PRN -PAD protocol in place: continue propofol gtt  PEA Cardiac arrest Suspect in the setting of respiratory arrest from severe metabolic disorder QTc prolongation Elevated Troponin likely demand without dynamic EKG changes Received 1 round of defibrillations UDS + Cocaine -Not meeting criteria for TTM -Echocardiogram ordered -Trend troponin, lactic -Serial EKG  Sepsis with due to suspected Pneumonia Lactic Acidosis Lactic: 1.5, Baseline PCT: 2.88>3.03>3.54, UA: pending, CXR: Progressive left basilar consolidation and left effusion. -Follow blood and Urine cultures, trend lactic/ PCT -Check strep pneumo antigen and Legionella -monitor WBC/ fever curve -Start broad coverage with Cefepime  & vancomycin  -Currently not requiring vasopressors. 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 BNP >4500 Hypertension Urgency PMHx: Cardiomyopathy -Continuous cardiac monitoring -Maintain  MAP greater than 65 -HD for fluid removal -Continue Coreg as bp permits. -Hold Losartan for now   ESRD on HD MWF Severe Metabolic Acidosis -Monitor I&O's / urinary output -Follow BMP -Continue Sodium bicarb gtt for now -Ensure adequate renal perfusion -Avoid nephrotoxic agents as able -Replace electrolytes as indicated -HD MWF -Nephrology following, input appreciated   Polysubstance Abuse UDS + Cocaine, Tobacco abuse -Nicotine  patch -cessation counseling once able to participate   Best practice:  Diet:  Tube Feed  Pain/Anxiety/Delirium protocol (if indicated): Yes (RASS goal 0) VAP protocol (if indicated): Yes DVT prophylaxis: Subcutaneous Heparin GI prophylaxis: N/A Glucose control:  SSI No Central venous access:  Yes, and it is still needed Arterial line:  N/A Foley:  N/A Mobility:  bed rest  PT consulted: N/A Last date of multidisciplinary goals of care discussion [11/21] Code Status:  full code Disposition: ICU   = Goals of Care = Code Status Order: @CODE @   Primary Emergency Contact: [place], Home Phone: [phone] Wishes to pursue full aggressive treatment and intervention options, including CPR and intubation, but goals of care will be addressed on going with family if that should become necessary.  Critical care time: 45 minutes     Rufina Falco, DNP, CCRN, FNP-C, AGACNP-BC Acute Care Nurse Practitioner  [facility] Pager: [phone] [place] at [facility]  .

## 2021-04-11 NOTE — Procedures (Signed)
PATIENT NAME: Edmar Blankenburg MEDICAL RECORD NUMBER: [id number] Birthday: [birth year]  Age: 50 y.o. Admit Date: 04/09/2021  Provider: Lanney Gins   Indication: PEA arrest   Technical Description:  CPR performance duration: >68min Was defibrillation or cardioversion used ? Yes  Was external pacer placed ? Yes  Was patient intubated pre/post CPR ? Yes  Was transvenous pacer placed ? no  Medications Administered Include      Yes/no Amiodarone Yes   Atropin no  Calcium yes  Epinephrine yes  Lidocaine No   Magnesium yes  Norepinephrine yes  Phenylephrine no  Sodium bicarbonate Yes   Vasopression no   Evaluation Final Status - Was patient successfully resuscitated ? yes If successfully resuscitated - what is current rhythm ? NSR If successfully resuscitated - what is current hemodynamic status ? Unstable on vasopressors  Miscellaneous Information Patient on HD during Cardiac arrest     Ottie Glazier, M.D.  Pulmonary & [place]

## 2021-04-11 NOTE — Progress Notes (Signed)
Progress Note    Larry Morrison   OXB:[id number]  DOB: Jan 02, 1971  DOA: 04/09/2021     1 Date of Service: 04/11/2021   Clinical Course 50 y.o. male with medical history significant for end-stage renal disease on hemodialysis, frequent noncompliance with hemodialysis, hypertension, severe protein calorie malnutrition, elevated troponin, history of pleural effusion, anemia due to chronic kidney disease, tobacco use disorder, history of CVA, history of TBI, who presents emergency department for chief concerns of shortness of breath.  He is admitted for volume overload secondary to missed hemodialysis  11/20: Hyperkalemia with potassium of 6.2 getting Veltassa.  Patient refusing dialysis today 11/21: pt finally agreed to do HD, on HD become unresponsive and had PEA arrest. ROSC was achieved. Still on levephed.     Assessment and Plan * Acute on chronic combined systolic and diastolic CHF (congestive heart failure) ([place]) Secondary to missed dialysis. Nephrology consulted. Patient has been refusing hemodialysis here in the hospital as well. Finally agreeable for HD.  Although unable to complete the full session on 11/21.[place] Kitchen   Noncompliance of patient with renal dialysis [facility]) He is refusing dialysis multiple times His potassium is high at 6.2 and getting Veltassa.   Hyperkalemia Potassium level 6.2.  Due to refusing HD.  Most likely causing cardiac arrest.  ESRD (end stage renal disease) [facility]) Nephrology on board and following.  He is on MWF schedule.   Pleural effusion on left Initial plan was to get through HD and follow-up chest x-ray after that to see whether he requires any intervention or not. Currently patient had a cardiac arrest and suspect that his pleural effusion will be worsening.  Cardiac arrest ([place]) On 11/21.  After 1 hour in HD patient become unresponsive and likely had respiratory arrest which admitted into cardiac arrest requiring CPR, shock.  Transferred to ICU after  that.  Prognosis very poor after this event.  Care transferred to ICU.  I have notified mother.  Anemia in ESRD (end-stage renal disease) ([place]) Hold EPO.  Hemoglobin 11.5.  Essential hypertension Patient has not been taking his blood pressure medicine at home.  Continue Coreg, Norvasc and losartan for now  Elevated troponin Likely secondary to demand ischemia from volume overload.  As well as poor clearance due to ESRD status and lack of HD.  No chest pain.  Monitor.  Malnutrition of moderate degree Placing the patient at high risk for poor outcome.  Monitor.  Cognitive impairment At baseline.  Secondary to TBI.    Subjective:  No nausea no vomiting.  Continues to have shortness of breath and fatigue and tiredness.  Seen in the morning at which time he agreed for hemodialysis. Later in the afternoon on hemodialysis had a cardiac arrest.  Objective Vitals:   04/11/21 1600 04/11/21 1615 04/11/21 1630 04/11/21 1645  BP: 113/87 115/81 111/74 133/80  Pulse: 70 71 70 70  Resp: 20 (!) 26 (!) 34 (!) 28  Temp:      TempSrc:      SpO2: 94% 96% 92% 95%  Weight:      Height:       49.1 kg  Exam General: Appear in mild distress, no Rash; Oral Mucosa Clear, moist. no Abnormal Neck Mass Or lumps, Conjunctiva normal, cachectic and malnourished. Cardiovascular: S1 and S2 Present, no Murmur, Respiratory: good respiratory effort, Bilateral Air entry present and CTA, no Crackles, no wheezes Abdomen: Bowel Sound present, Soft and no tenderness Extremities: no Pedal edema Neurology: Drowsy and oriented to place, and person  affect flat. no new focal deficit Gait not checked due to patient safety concerns   Labs / Other Information Potassium 6.1.  BUN 100.  Hemoglobin 10.   Disposition Plan: Status is: Inpatient  Remains inpatient appropriate because: Currently on ventilator intubated in the ICU.  Time spent: 35 minutes [facility] 04/11/2021, 5:51 PM

## 2021-04-11 NOTE — Assessment & Plan Note (Signed)
Management per nephrology.

## 2021-04-11 NOTE — Procedures (Signed)
Central Venous Catheter Insertion Procedure Note  Larry Morrison  [id number]  03-13-1971  Date:04/11/21  Time:6:46 PM   Provider Performing:Hollyn Stucky D Dewaine Conger   Procedure: Insertion of Non-tunneled Central Venous Catheter(36556) with US guidance (73543)   Indication(s) Medication administration and Difficult access  Consent Unable to obtain consent due to emergent nature of procedure.  Anesthesia Topical only with 1% lidocaine   Timeout Verified patient identification, verified procedure, site/side was marked, verified correct patient position, special equipment/implants available, medications/allergies/relevant history reviewed, required imaging and test results available.  Sterile Technique Maximal sterile technique including full sterile barrier drape, hand hygiene, sterile gown, sterile gloves, mask, hair covering, sterile ultrasound probe cover (if used).  Procedure Description Area of catheter insertion was cleaned with chlorhexidine and draped in sterile fashion.  With real-time ultrasound guidance a central venous catheter was placed into the right femoral vein. Nonpulsatile blood flow and easy flushing noted in all ports.  The catheter was sutured in place and sterile dressing applied.  Complications/Tolerance None; patient tolerated the procedure well. Chest X-ray is ordered to verify placement for internal jugular or subclavian cannulation.   Chest x-ray is not ordered for femoral cannulation.  EBL Minimal  Specimen(s) None   Line secured at the 20 cm mark. BIOPATCH applied to the insertion site.   Darel Hong, AGACNP-BC [facility] Prefer epic messenger for cross cover needs If after hours, please call E-link

## 2021-04-12 ENCOUNTER — Inpatient Hospital Stay (HOSPITAL_COMMUNITY)
Admit: 2021-04-12 | Discharge: 2021-04-12 | Disposition: A | Discharge status: Home / Self Care | Payer: Medicaid Other | Attending: Pulmonary Disease | Admitting: Pulmonary Disease

## 2021-04-12 ENCOUNTER — Inpatient Hospital Stay: Payer: Medicaid Other

## 2021-04-12 DIAGNOSIS — I469 Cardiac arrest, cause unspecified: Secondary | ICD-10-CM

## 2021-04-12 DIAGNOSIS — J9601 Acute respiratory failure with hypoxia: Secondary | ICD-10-CM

## 2021-04-12 DIAGNOSIS — I5043 Acute on chronic combined systolic (congestive) and diastolic (congestive) heart failure: Secondary | ICD-10-CM | POA: Diagnosis not present

## 2021-04-12 DIAGNOSIS — Z515 Encounter for palliative care: Secondary | ICD-10-CM

## 2021-04-12 DIAGNOSIS — Z789 Other specified health status: Secondary | ICD-10-CM

## 2021-04-12 DIAGNOSIS — E440 Moderate protein-calorie malnutrition: Secondary | ICD-10-CM

## 2021-04-12 DIAGNOSIS — N186 End stage renal disease: Secondary | ICD-10-CM | POA: Diagnosis not present

## 2021-04-12 LAB — URINALYSIS, COMPLETE (UACMP) WITH MICROSCOPIC
Bilirubin Urine: NEGATIVE
Glucose, UA: NEGATIVE mg/dL
Ketones, ur: NEGATIVE mg/dL
Nitrite: NEGATIVE
Protein, ur: 100 mg/dL — AB
RBC / HPF: >50 RBC/hpf — ABNORMAL HIGH (ref 0–5)
Specific Gravity, Urine: 1.013 (ref 1.005–1.030)
Squamous Epithelial / HPF: NONE SEEN (ref 0–5)
WBC, UA: >50 WBC/hpf — ABNORMAL HIGH (ref 0–5)
pH: 7 (ref 5.0–8.0)

## 2021-04-12 LAB — CBC
HCT: 28.6 % — ABNORMAL LOW (ref 39.0–52.0)
Hemoglobin: 9 g/dL — ABNORMAL LOW (ref 13.0–17.0)
MCH: 30.6 pg (ref 26.0–34.0)
MCHC: 31.5 g/dL (ref 30.0–36.0)
MCV: 97.3 fL (ref 80.0–100.0)
Platelets: 187 10*3/uL (ref 150–400)
RBC: 2.94 MIL/uL — ABNORMAL LOW (ref 4.22–5.81)
RDW: 16.7 % — ABNORMAL HIGH (ref 11.5–15.5)
WBC: 6.5 10*3/uL (ref 4.0–10.5)
nRBC: 0 % (ref 0.0–0.2)

## 2021-04-12 LAB — ECHOCARDIOGRAM COMPLETE
AR max vel: 1.92 cm2
AV Area VTI: 1.98 cm2
AV Area mean vel: 1.87 cm2
AV Mean grad: 2.5 mmHg
AV Peak grad: 4.2 mmHg
Ao pk vel: 1.03 m/s
Area-P 1/2: 4.26 cm2
Calc EF: 24.5 %
Height: 62.008 in
MV VTI: 1.38 cm2
S' Lateral: 4.6 cm
Single Plane A2C EF: 28.4 %
Single Plane A4C EF: 19.9 %
Weight: 1731.93 oz

## 2021-04-12 LAB — BLOOD GAS, VENOUS
Acid-base deficit: 1.4 mmol/L (ref 0.0–2.0)
Bicarbonate: 22.6 mmol/L (ref 20.0–28.0)
FIO2: 0.6
MECHVT: 450 mL
O2 Saturation: 89.1 %
PEEP: 10 cmH2O
Patient temperature: 37
RATE: 20 resp/min
pCO2, Ven: 34 mmHg — ABNORMAL LOW (ref 44.0–60.0)
pH, Ven: 7.43 (ref 7.250–7.430)
pO2, Ven: 55 mmHg — ABNORMAL HIGH (ref 32.0–45.0)

## 2021-04-12 LAB — BASIC METABOLIC PANEL
Anion gap: 12 (ref 5–15)
BUN: 73 mg/dL — ABNORMAL HIGH (ref 6–20)
CO2: 23 mmol/L (ref 22–32)
Calcium: 8.6 mg/dL — ABNORMAL LOW (ref 8.9–10.3)
Chloride: 104 mmol/L (ref 98–111)
Creatinine, Ser: 7.96 mg/dL — ABNORMAL HIGH (ref 0.61–1.24)
GFR, Estimated: 8 mL/min — ABNORMAL LOW (ref >60)
Glucose, Bld: 89 mg/dL (ref 70–99)
Potassium: 5.3 mmol/L — ABNORMAL HIGH (ref 3.5–5.1)
Sodium: 139 mmol/L (ref 135–145)

## 2021-04-12 LAB — PHOSPHORUS: Phosphorus: 7.1 mg/dL — ABNORMAL HIGH (ref 2.5–4.6)

## 2021-04-12 LAB — TROPONIN I (HIGH SENSITIVITY)
Troponin I (High Sensitivity): 539 ng/L (ref <18)
Troponin I (High Sensitivity): 303 ng/L (ref <18)

## 2021-04-12 LAB — STREP PNEUMONIAE URINARY ANTIGEN: Strep Pneumo Urinary Antigen: NEGATIVE

## 2021-04-12 LAB — HEPATITIS B SURFACE ANTIBODY, QUANTITATIVE: Hep B S AB Quant (Post): <3.1 m[IU]/mL — ABNORMAL LOW (ref >9.9)

## 2021-04-12 LAB — MRSA NEXT GEN BY PCR, NASAL: MRSA by PCR Next Gen: DETECTED — AB

## 2021-04-12 LAB — MAGNESIUM: Magnesium: 2.5 mg/dL — ABNORMAL HIGH (ref 1.7–2.4)

## 2021-04-12 LAB — TRIGLYCERIDES: Triglycerides: 122 mg/dL (ref <150)

## 2021-04-12 MED ORDER — DEXMEDETOMIDINE HCL IN NACL 400 MCG/100ML IV SOLN
0.4000 ug/kg/h | INTRAVENOUS | Status: DC
Start: 2021-04-12 — End: 2021-04-15
  Administered 2021-04-12: 0.6 ug/kg/h via INTRAVENOUS
  Filled 2021-04-12: qty 100

## 2021-04-12 MED ORDER — SODIUM CHLORIDE 0.9 % IV SOLN
100.0000 mg | Freq: Two times a day (BID) | INTRAVENOUS | Status: AC
  Administered 2021-04-12 – 2021-04-16 (×10): 100 mg via INTRAVENOUS
  Filled 2021-04-12 (×12): qty 100

## 2021-04-12 MED ORDER — NOREPINEPHRINE 4 MG/250ML-% IV SOLN
0.0000 ug/min | INTRAVENOUS | Status: DC
  Administered 2021-04-12: 5 ug/min via INTRAVENOUS
  Filled 2021-04-12: qty 250

## 2021-04-12 MED ORDER — MIDAZOLAM HCL 2 MG/2ML IJ SOLN
2.0000 mg | INTRAMUSCULAR | Status: DC | PRN
  Administered 2021-04-12 – 2021-04-16 (×17): 2 mg via INTRAVENOUS
  Filled 2021-04-12 (×15): qty 2

## 2021-04-12 MED ORDER — FENTANYL CITRATE PF 50 MCG/ML IJ SOSY
50.0000 ug | PREFILLED_SYRINGE | INTRAMUSCULAR | Status: AC | PRN
  Administered 2021-04-14 (×3): 50 ug via INTRAVENOUS
  Filled 2021-04-12 (×3): qty 1

## 2021-04-12 MED ORDER — MIDAZOLAM HCL 2 MG/2ML IJ SOLN
2.0000 mg | INTRAMUSCULAR | Status: AC | PRN
  Administered 2021-04-13 – 2021-04-15 (×3): 2 mg via INTRAVENOUS
  Filled 2021-04-12 (×6): qty 2

## 2021-04-12 MED ORDER — PANTOPRAZOLE SODIUM 40 MG IV SOLR
40.0000 mg | INTRAVENOUS | Status: DC
  Administered 2021-04-12 – 2021-04-19 (×8): 40 mg via INTRAVENOUS
  Filled 2021-04-12 (×7): qty 40

## 2021-04-12 MED ORDER — FENTANYL CITRATE (PF) 100 MCG/2ML IJ SOLN
INTRAMUSCULAR | Status: AC
  Filled 2021-04-12: qty 2

## 2021-04-12 MED ORDER — FENTANYL CITRATE PF 50 MCG/ML IJ SOSY
50.0000 ug | PREFILLED_SYRINGE | INTRAMUSCULAR | Status: DC | PRN
  Filled 2021-04-12: qty 2

## 2021-04-12 MED ORDER — ROCURONIUM BROMIDE 10 MG/ML (PF) SYRINGE
PREFILLED_SYRINGE | INTRAVENOUS | Status: AC
  Filled 2021-04-12: qty 10

## 2021-04-12 MED ORDER — MIDAZOLAM HCL 2 MG/2ML IJ SOLN
INTRAMUSCULAR | Status: AC
  Administered 2021-04-12: 2 mg via INTRAVENOUS
  Filled 2021-04-12: qty 2

## 2021-04-12 NOTE — Progress Notes (Signed)
Patient with scheduled HD session via CVC, patient unable to complete due to malfunction of CVC. During initiation of treatment, arterial and venous pressure exceeded parameters, NS flushes along with heparin fail to resolve. Contacted nephrologist who gives instruction to cease treatment, Cath Flo patient in the morning, after dwell, proceed with dialysis treatment. Of note, patient made attempts to dislodge tube from mouth, patient was aware and gave non-verbal indication that he was aware of his actions. Treatment rescheduled for 04/13/21.

## 2021-04-12 NOTE — Progress Notes (Addendum)
                                                     Palliative Care Progress Note, Assessment & Plan   Patient Name: Larry Morrison       Date: 04/12/2021 DOB: 1971/03/12  Age: 50 y.o. MRN#: [id number] Attending Physician: Ottie Glazier, MD Primary Care Physician: [facility] Admit Date: 04/09/2021  Reason for Consultation/Follow-up: Establishing goals of care  After reviewing the patient's chart, epic notes, labs, and imaging, I assessed the patient at bedside.  Patient is off of sedation and intubated.  No family at bedside.  I attempted to speak with the patient's mother and sister.  I was unable to leave voicemail for mother.  I left a HIPAA appropriate voicemail for sister.  As per nursing family is expected to be at bedside today. I asked bedside nurse to contact me if family comes to bedside.  I will continue to monitor the patient throughout his hospitalization and provide support to patient and family.  Addendum: Patient's sister returned my phone call.  I gave her a brief medical update.  She plans to be at bedside this afternoon.  I asked her to notify nursing if she would like me to be at bedside when she visits.  [place] Ilsa Iha, FNP-BC Palliative Medicine Team Team Phone # [phone]  NO CHARGE

## 2021-04-12 NOTE — Progress Notes (Signed)
NAME:  Larry Morrison, MRN:  [id number], DOB:  Jul 15, 1970, LOS: 2 ADMISSION DATE:  04/09/2021 CONSULTATION DATE:  04/11/2021  [name] MD:  Berle Mull, MD   CHIEF COMPLAINT:  Cardiac Arrest   BRIEF SYNOPSIS: Larry Morrison is a 50 y.o. M with ESRD on HD, HTN, malnutrition, history of CVA, history of TBI admitted on 04/09/2021 with shortness of breath / volume overload secondary to missed hemodialysis. Hospital course complicated by PEA arrest during HD on 11/21. Currently intubated in critical care unit.   History of Present Illness:  50 y.o with significant PMH of ESRD on HD who presented to the ED after an accidental fall at home due to falling asleep in his wheelchair.     Pertinent  Medical History   Cardiomyopathy     Protein-calorie malnutrition, severe 03/01/2021  Arteriovenous fistula occlusion (HCC)    Pleural effusion 02/28/2021  Arteriovenous fistula thrombosis (HCC) 84/53/6468  Metabolic acidosis 08/10/2246  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 02/28/2021  History of traumatic brain injury 09/07/2020  Cognitive impairment 25/00/3704  Metabolic encephalopathy 88/89/1694  ESRD needing dialysis ([place]) 09/04/2020  Hypoglycemia 09/04/2020  Uremia 09/04/2020  COVID-19 virus infection 06/18/2020  Stroke ([place]) 06/18/2020  Thrombocytopenia ([place]) 06/18/2020  Elevated troponin 06/18/2020  Hyperkalemia 06/13/2020  Anemia in ESRD (end-stage renal disease) ([place]) 09/17/2019  Essential hypertension 07/15/2019  Testicular torsion    Pyuria 06/10/2016  Chronic pain following surgery or procedure 12/01/2015  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 11/20: Hyperkalemia with potassium of 6.2 getting [place].  Patient refusing dialysis today. 11/21: Patient went into PEA Cardiac arrest during HD session, intubated post ROSC and transferred to ICU. PCCM consulted 11/22: Patient intubated, off sedation. Not following commands, but alert and opens eyes to verbal. Will resume HD today as tolerated per nephro.  Consults:  Nephrology  PCCM  Micro Data:  11/19: SARS-CoV-2 PCR> negative 11/19: Influenza PCR> negative 11/21: Blood culture x2> 11/21: Urine Culture> 11/21: Tracheal aspirate> 11/21: MRSA PCR>> positive 11/21: Strep pneumo urinary antigen> negative 11/21: Legionella urinary antigen>   Antimicrobials:  Vancomycin 11/21 x1 Cefepime 11/21> 11/22 Doxycycline 11/22>   Interim History / Subjective:  Patient intubated, off sedation. Not following commands, but alert and opens eyes to verbal, grimaces to painful stimuli. Will resume HD today as tolerated.   Objective   Blood pressure (!) 84/60, pulse 60, temperature 98.4 F (36.9 C), temperature source Oral, resp. rate (!) 22, height 5' 2.01" (1.575 m), weight 49.1 kg, SpO2 100 %.    Vent Mode: PRVC FiO2 (%):  [50 %-100 %] 50 % Set Rate:  [18 bmp-20 bmp] 18 bmp Vt Set:  [450 mL] 450 mL PEEP:  [10 cmH20] 10 cmH20 Plateau Pressure:  [32 cmH20] 32 cmH20   Intake/Output Summary (Last 24 hours) at 04/12/2021 0941 Last data filed at 04/12/2021 0600 Gross per 24 hour  Intake 252.08 ml  Output 200 ml  Net 52.08 ml   Filed Weights   04/09/21 1530 04/11/21 1525  Weight: 68 kg 49.1 kg  REVIEW OF SYSTEMS  PATIENT IS UNABLE TO PROVIDE COMPLETE REVIEW OF SYSTEMS DUE TO INTUBATION   PHYSICAL EXAMINATION:  GENERAL: 50 year-old critically ill patient lying in the  bed intubated, mechanically ventilated, frail and cachectic appearing. EYES: Pupils equal, round, reactive to light and accommodation. Mydriasis 36mm bilateral. No scleral icterus. Extraocular muscles intact.  HEENT: Head atraumatic, normocephalic. ETT in place.  NECK:  Supple, no jugular venous distention.  LUNGS: Decreased breath sounds bilaterally, no wheezing, rales, moderate rhonchi bilaterally. Grimace with chest wall palpation. CARDIOVASCULAR: S1, S2 normal. No murmurs, rubs, or gallops.  ABDOMEN: Soft, nontender, nondistended. Bowel sounds present. No organomegaly or mass.  Nephrostomy tube in place draining yellow urine.  EXTREMITIES: No pedal edema, cyanosis, or clubbing. Extremely decreased muscle mass.  NEUROLOGIC: Intubated & unable to follow commands.  SKIN: No obvious rash, lesion, or ulcer. Dry skin to lower legs.   Labs/imaging that I havepersonally reviewed  (right click and "Reselect all SmartList Selections" daily)  11/21: Chest Xray>Progressive left basilar consolidation and left effusion. 11/21: Abdominal xray>Enteric catheter tip and side port projecting over the gastric body. Bowel gas pattern is unremarkable. Persistent left basilar consolidation and effusion. 11/21: Noncontrast CT head> No acute intracranial abnormality. Old infarcts noted.  11/19: Chest Xray>Cardiomegaly. Increased homogeneous opacity is seen in the left mid and left lower lung fields suggesting increase in left pleural effusion and possibly underlying atelectasis/pneumonia. Central pulmonary vessels are slightly less prominent. There are no signs of alveolar pulmonary edema in the right lung.  Resolved Hospital Problem list    ASSESSMENT AND PLAN  SYNOPSIS: Larry Morrison is a 50 y.o. M with ESRD on HD, HTN, malnutrition, history of CVA, history of TBI admitted on 04/09/2021 with shortness of breath / volume overload secondary to missed hemodialysis. Hospital course complicated by PEA arrest during HD  on 11/21. Currently intubated in critical care unit.   Acute Hypoxic Respiratory Failure in the setting of Pulmonary Edema with Pleural Effusion, Combined severe CHF and probable Pneumonia PMHx: ESRD noncompliance with hemodialysis treatment. -Continue ventilator support & lung protective strategies -Wean PEEP & FiO2 as tolerated, maintain SpO2 > 90% -Head of bed elevated 30 degrees, VAP protocol in place -Plateau pressures less than 30 cm H20  -Intermittent chest x-ray & ABG PRN -Daily WUA with SBT as tolerated  -Ensure adequate pulmonary hygiene  -HD for fluid removal- per neprho -Budesonide inhaler nebs BID, bronchodilators PRN -PAD protocol in place: continue propofol gtt *Requiring PEEP 10 and FiO2 50% - if continues to have difficulty weaning from vent after dialysis, consider thoracentesis or pigtail chest tube placement for large appearing L pleural effusion  PEA Cardiac arrest Suspect in the setting of respiratory arrest from severe metabolic disorder QTc prolongation Elevated Troponin likely demand without dynamic EKG changes Received 1 round of defibrillations UDS + Cocaine, opioids  -Not meeting criteria for TTM -Echocardiogram ordered -Trend troponin, lactic -Serial EKG   Sepsis with shock due to suspected Pneumonia Lactic Acidosis Lactic: 1.5, Baseline PCT: 2.88>3.03>3.54, UA: pending, CXR: Progressive left basilar consolidation and left effusion. -Follow blood and urine cultures, trend lactic/ PCT -Check strep pneumo antigen (negative) and Legionella -monitor WBC/ fever curve -Start broad coverage with Cefepime  & vancomycin- downgraded to doxycyline (MRSA nares +)  -Currently not requiring vasopressors. 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 BNP >4500 Hypertension Urgency PMHx: Cardiomyopathy -Continuous cardiac monitoring -  Maintain MAP greater  than 65 -HD for fluid removal - restart today per nephrology -Continue Coreg as bp permits. -Hold Losartan for now   ESRD on HD MWF Severe Metabolic Acidosis -Monitor I&O's / urinary output -Follow BMP -Stop Sodium bicarb gtt  -Ensure adequate renal perfusion -Avoid nephrotoxic agents as able -Replace electrolytes as indicated (currently on sevelamer & patiromer)  -HD MWF -Nephrology following, input appreciated   Polysubstance Abuse UDS + Cocaine, opioids, Tobacco abuse -Nicotine patch -cessation counseling once able to participate  Best practice (right click and "Reselect all SmartList Selections" daily)  Diet: NPO Pain/Anxiety/Delirium protocol (if indicated): Yes (RASS goal -1) VAP protocol (if indicated): Yes DVT prophylaxis: Subcutaneous Heparin GI prophylaxis: PPI Glucose control:  SSI Yes Central venous access:  Yes, and it is still needed Arterial line:  N/A Foley:  Yes, and it is still needed Mobility:  bed rest  Code Status:  FULL Disposition:ICU  Labs   CBC: Recent Labs  Lab 04/10/21 0746 04/11/21 0347 04/11/21 1449 04/11/21 1930 04/12/21 0540  WBC 7.4 6.4 5.9 9.1 6.5  NEUTROABS  --   --  4.2  --   --   HGB 11.5* 10.3* 10.0* 9.0* 9.0*  HCT 36.2* 32.0* 30.8* 29.2* 28.6*  MCV 97.3 96.7 96.3 98.3 97.3  PLT 241 230 237 189 950    Basic Metabolic Panel: Recent Labs  Lab 04/09/21 1653 04/10/21 0746 04/11/21 1449 04/11/21 1930 04/12/21 0540  NA 141 139 140 141 139  K 4.9 6.2* 6.1* 4.7 5.3*  CL 104 105 105 106 104  CO2 22 19* 19* 21* 23  GLUCOSE 86 82 122* 92 89  BUN 79* 87* 100* 71* 73*  CREATININE 8.68* 9.10* 10.31* 7.45* 7.96*  CALCIUM 8.6* 8.5* 8.4* 9.0 8.6*  MG 2.3  --   --   --  2.5*  PHOS 7.8*  --  9.6*  --  7.1*   GFR: Estimated Creatinine Clearance: 7.5 mL/min (A) (by C-G formula based on SCr of 7.96 mg/dL (H)). Recent Labs  Lab 04/09/21 1844 04/10/21 0746 04/11/21 0347 04/11/21 1449 04/11/21 1930 04/11/21 2205  04/12/21 0540  PROCALCITON 2.88 3.03 3.54  --   --   --   --   WBC  --  7.4 6.4 5.9 9.1  --  6.5  LATICACIDVEN  --   --   --   --   --  1.5  --     Liver Function Tests: Recent Labs  Lab 04/11/21 1449 04/11/21 1930  AST  --  26  ALT  --  15  ALKPHOS  --  105  BILITOT  --  1.3*  PROT  --  7.3  ALBUMIN 2.5* 2.3*   No results for input(s): LIPASE, AMYLASE in the last 168 hours. No results for input(s): AMMONIA in the last 168 hours.  ABG    Component Value Date/Time   PHART 7.16 (LL) 04/11/2021 1740   PCO2ART 53 (H) 04/11/2021 1740   PO2ART 51 (L) 04/11/2021 1740   HCO3 22.6 04/12/2021 0521   TCO2 27 08/07/2019 1018   ACIDBASEDEF 1.4 04/12/2021 0521   O2SAT 89.1 04/12/2021 0521     Coagulation Profile: Recent Labs  Lab 04/11/21 1930  INR 1.5*    Cardiac Enzymes: No results for input(s): CKTOTAL, CKMB, CKMBINDEX, TROPONINI in the last 168 hours.  HbA1C: Hgb A1c MFr Bld  Date/Time Value Ref Range Status  09/05/2020 05:00 AM 5.0 4.8 - 5.6 % Final    Comment:    (  NOTE)         Prediabetes: 5.7 - 6.4         Diabetes: >6.4         Glycemic control for adults with diabetes: <7.0     CBG: Recent Labs  Lab 04/11/21 1755  GLUCAP 75     Past Medical History:  He,  has a past medical history of Anemia, Aneurysm ([place]), Dialysis patient ([place]), Dyspnea, Foot drop, History of kidney stones, History of nephrostomy, Hypertension, Renal disorder, and Stroke ([place]).   Surgical History:   Past Surgical History:  Procedure Laterality Date   A/V SHUNT INTERVENTION N/A 03/01/2021   Procedure: A/V SHUNT INTERVENTION;  Surgeon: Katha Cabal, MD;  Location: [facility];  Service: Cardiovascular;  Laterality: N/A;   AV FISTULA PLACEMENT Right 08/07/2019   Procedure: ARTERIOVENOUS (AV) FISTULA CREATION;  Surgeon: Algernon Huxley, MD;  Location: [facility];  Service: Vascular;  Laterality: Right;   AV FISTULA PLACEMENT Right 11/20/2019   Procedure: INSERTION OF  ARTERIOVENOUS (AV) GORE-TEX GRAFT ARM;  Surgeon: Algernon Huxley, MD;  Location: [facility];  Service: Vascular;  Laterality: Right;   IR NEPHROSTOMY EXCHANGE RIGHT  09/10/2020   NEPHRECTOMY Left    NEPHRECTOMY     ORCHIECTOMY Right 10/27/2016   Procedure: PSB ORCHIECTOMY;  Surgeon: Hollice Espy, MD;  Location: [facility];  Service: Urology;  Laterality: Right;   ORCHIOPEXY Bilateral 10/27/2016   Procedure: ORCHIOPEXY ADULT;  Surgeon: Hollice Espy, MD;  Location: [facility];  Service: Urology;  Laterality: Bilateral;   SCROTAL EXPLORATION Bilateral 10/27/2016   Procedure: SCROTUM EXPLORATION;  Surgeon: Hollice Espy, MD;  Location: [facility];  Service: Urology;  Laterality: Bilateral;     Social History:   reports that he has quit smoking. His smoking use included cigarettes. He smoked an average of .5 packs per day. He has never used smokeless tobacco. He reports current drug use. Drug: Marijuana. He reports that he does not drink alcohol.   Family History:  His Family history is unknown by patient.   Allergies Allergies  Allergen Reactions   Vancomycin     Patient denies - repeated denial to pharm tech 09-05-2020     Home Medications  Prior to Admission medications   Medication Sig Start Date End Date Taking? Authorizing Provider  aspirin EC 81 MG EC tablet Take 1 tablet (81 mg total) by mouth daily. Swallow whole. 03/04/21   Enzo Bi, MD  carvedilol (COREG) 3.125 MG tablet Take 1 tablet (3.125 mg total) by mouth 2 (two) times daily with a meal. Patient not taking: Reported on 04/09/2021 03/03/21 06/01/21  Enzo Bi, MD  feeding supplement (ENSURE ENLIVE / ENSURE PLUS) LIQD Take 237 mLs by mouth daily. Patient not taking: Reported on 04/09/2021 03/03/21   Enzo Bi, MD  losartan (COZAAR) 25 MG tablet Take 1 tablet (25 mg total) by mouth daily. Patient not taking: Reported on 04/09/2021 03/04/21 06/02/21  Enzo Bi, MD  multivitamin (RENA-VIT) TABS tablet Take 1 tablet by mouth at  bedtime. Patient not taking: Reported on 04/09/2021 03/03/21   Enzo Bi, MD  sevelamer carbonate (RENVELA) 800 MG tablet Take 1 tablet (800 mg total) by mouth 3 (three) times daily with meals. Patient not taking: Reported on 04/09/2021 03/03/21 06/01/21  Enzo Bi, MD   = Goals of Care = Code Status Order: @CODE @   Primary Emergency Contact: [place], Home Phone: [phone] Wishes to pursue full aggressive treatment and intervention options, including CPR and intubation,  but goals of care will be addressed on going with family if that should become necessary.  Critical care provider statement:   Total critical care time: 33 minutes   Performed by: Lanney Gins MD   Critical care time was exclusive of separately billable procedures and treating other patients.   Critical care was necessary to treat or prevent imminent or life-threatening deterioration.   Critical care was time spent personally by me on the following activities: development of treatment plan with patient and/or surrogate as well as nursing, discussions with consultants, evaluation of patient's response to treatment, examination of patient, obtaining history from patient or surrogate, ordering and performing treatments and interventions, ordering and review of laboratory studies, ordering and review of radiographic studies, pulse oximetry and re-evaluation of patient's condition.    Ottie Glazier, M.D.  Pulmonary & Critical Care Medicine

## 2021-04-12 NOTE — Progress Notes (Signed)
[facility]  ROUNDING NOTE   Subjective:   Patient had cardiac arrest during dialysis.  He was intubated and placed on pressors and moved to the ICU. This morning, he is off pressors and sedation.  He is trying to open his eyes.  Not following commands yet. FiO2 50%.  Objective:  Vital signs in last 24 hours:  Temp:  [95.2 F (35.1 C)-98.5 F (36.9 C)] 98.5 F (36.9 C) (11/22 0800) Pulse Rate:  [59-80] 65 (11/22 0700) Resp:  [13-35] 20 (11/22 0900) BP: (61-159)/(46-120) 100/68 (11/22 0900) SpO2:  [75 %-100 %] 100 % (11/22 0743) FiO2 (%):  [50 %-100 %] 50 % (11/22 0743) Weight:  [49.1 kg] 49.1 kg (11/21 1525)  Weight change:  Filed Weights   04/09/21 1530 04/11/21 1525  Weight: 68 kg 49.1 kg    Intake/Output: I/O last 3 completed shifts: In: 732.1 [P.O.:600; I.V.:32.1; IV Piggyback:100] Out: 400 [Urine:300; Emesis/NG output:100]   Intake/Output this shift:  Total I/O In: 4.7 [I.V.:4.7] Out: -   Physical Exam: General: Critically ill appearing  Head: ET tube in place  Lungs:  Ventilator assisted, FiO2 50%, decreased breath sounds at bases  Heart: Tachycardic  Abdomen:  Soft, nontender,  Extremities:  no peripheral edema.  Neurologic: Opens eyes, not following commands  Skin: Warm   Access: Right IJ permcath    Basic Metabolic Panel: Recent Labs  Lab 04/09/21 1653 04/10/21 0746 04/11/21 1449 04/11/21 1930 04/12/21 0540  NA 141 139 140 141 139  K 4.9 6.2* 6.1* 4.7 5.3*  CL 104 105 105 106 104  CO2 22 19* 19* 21* 23  GLUCOSE 86 82 122* 92 89  BUN 79* 87* 100* 71* 73*  CREATININE 8.68* 9.10* 10.31* 7.45* 7.96*  CALCIUM 8.6* 8.5* 8.4* 9.0 8.6*  MG 2.3  --   --   --  2.5*  PHOS 7.8*  --  9.6*  --  7.1*     Liver Function Tests: Recent Labs  Lab 04/11/21 1449 04/11/21 1930  AST  --  26  ALT  --  15  ALKPHOS  --  105  BILITOT  --  1.3*  PROT  --  7.3  ALBUMIN 2.5* 2.3*   No results for input(s): LIPASE, AMYLASE in the last 168  hours. No results for input(s): AMMONIA in the last 168 hours.  CBC: Recent Labs  Lab 04/10/21 0746 04/11/21 0347 04/11/21 1449 04/11/21 1930 04/12/21 0540  WBC 7.4 6.4 5.9 9.1 6.5  NEUTROABS  --   --  4.2  --   --   HGB 11.5* 10.3* 10.0* 9.0* 9.0*  HCT 36.2* 32.0* 30.8* 29.2* 28.6*  MCV 97.3 96.7 96.3 98.3 97.3  PLT 241 230 237 189 187     Cardiac Enzymes: No results for input(s): CKTOTAL, CKMB, CKMBINDEX, TROPONINI in the last 168 hours.  BNP: Invalid input(s): POCBNP  CBG: Recent Labs  Lab 04/11/21 1755  GLUCAP 75    Microbiology: Results for orders placed or performed during the hospital encounter of 04/09/21  Resp Panel by RT-PCR (Flu A&B, Covid) Nasopharyngeal Swab     Status: None   Collection Time: 04/09/21  7:29 PM   Specimen: Nasopharyngeal Swab; Nasopharyngeal(NP) swabs in vial transport medium  Result Value Ref Range Status   SARS Coronavirus 2 by RT PCR NEGATIVE NEGATIVE Final    Comment: (NOTE) SARS-CoV-2 target nucleic acids are NOT DETECTED.  The SARS-CoV-2 RNA is generally detectable in upper respiratory specimens during the acute phase of  infection. The lowest concentration of SARS-CoV-2 viral copies this assay can detect is 138 copies/mL. A negative result does not preclude SARS-Cov-2 infection and should not be used as the sole basis for treatment or other patient management decisions. A negative result may occur with  improper specimen collection/handling, submission of specimen other than nasopharyngeal swab, presence of viral mutation(s) within the areas targeted by this assay, and inadequate number of viral copies(<138 copies/mL). A negative result must be combined with clinical observations, patient history, and epidemiological information. The expected result is Negative.  Fact Sheet for Patients:  EntrepreneurPulse.com.au  Fact Sheet for Healthcare Providers:  IncredibleEmployment.be  This test  is no t yet approved or cleared by the [place] FDA and  has been authorized for detection and/or diagnosis of SARS-CoV-2 by FDA under an Emergency Use Authorization (EUA). This EUA will remain  in effect (meaning this test can be used) for the duration of the COVID-19 declaration under Section 564(b)(1) of the Act, 21 U.S.C.section 360bbb-3(b)(1), unless the authorization is terminated  or revoked sooner.       Influenza A by PCR NEGATIVE NEGATIVE Final   Influenza B by PCR NEGATIVE NEGATIVE Final    Comment: (NOTE) The Xpert Xpress SARS-CoV-2/FLU/RSV plus assay is intended as an aid in the diagnosis of influenza from Nasopharyngeal swab specimens and should not be used as a sole basis for treatment. Nasal washings and aspirates are unacceptable for Xpert Xpress SARS-CoV-2/FLU/RSV testing.  Fact Sheet for Patients: EntrepreneurPulse.com.au  Fact Sheet for Healthcare Providers: IncredibleEmployment.be  This test is not yet approved or cleared by the [place] FDA and has been authorized for detection and/or diagnosis of SARS-CoV-2 by FDA under an Emergency Use Authorization (EUA). This EUA will remain in effect (meaning this test can be used) for the duration of the COVID-19 declaration under Section 564(b)(1) of the Act, 21 U.S.C. section 360bbb-3(b)(1), unless the authorization is terminated or revoked.  Performed at [facility], [address] [place], [place] [address]   MRSA Next Gen by PCR, Nasal     Status: Abnormal   Collection Time: 04/11/21 10:23 PM   Specimen: Nasal Mucosa; Nasal Swab  Result Value Ref Range Status   MRSA by PCR Next Gen DETECTED (A) NOT DETECTED Final    Comment: RESULT CALLED TO, READ BACK BY AND VERIFIED WITH: TESSYAMMA THOMAS '@0006'  04/12/21 RH (NOTE) The GeneXpert MRSA Assay (FDA approved for NASAL specimens only), is one component of a comprehensive MRSA colonization  surveillance program. It is not intended to diagnose MRSA infection nor to guide or monitor treatment for MRSA infections. Test performance is not FDA approved in patients less than 81 years old. Performed at [facility], [address] [place], [place] [address]   CULTURE, BLOOD (ROUTINE X 2) w Reflex to ID Panel     Status: None (Preliminary result)   Collection Time: 04/11/21 10:54 PM   Specimen: BLOOD LEFT HAND  Result Value Ref Range Status   Specimen Description BLOOD LEFT HAND  Final   Special Requests   Final    BOTTLES DRAWN AEROBIC ONLY Blood Culture adequate volume   Culture   Final    NO GROWTH < 12 HOURS Performed at [facility], [address] [place], [place] [address]    Report Status PENDING  Incomplete  CULTURE, BLOOD (ROUTINE X 2) w Reflex to ID Panel     Status: None (Preliminary result)   Collection Time: 04/11/21 11:05 PM  Specimen: BLOOD LEFT HAND  Result Value Ref Range Status   Specimen Description BLOOD LEFT HAND  Final   Special Requests   Final    BOTTLES DRAWN AEROBIC ONLY Blood Culture adequate volume   Culture   Final    NO GROWTH < 12 HOURS Performed at [facility], [address] [place], [place] [address]    Report Status PENDING  Incomplete  Culture, Respiratory w Gram Stain     Status: None (Preliminary result)   Collection Time: 04/12/21 12:31 AM   Specimen: Tracheal Aspirate; Respiratory  Result Value Ref Range Status   Specimen Description   Final    TRACHEAL ASPIRATE Performed at [facility], [address] [place], [place] [address]    Special Requests   Final    NONE Performed at [facility], [place], [place] [address]    Gram Stain   Final    FEW SQUAMOUS EPITHELIAL CELLS PRESENT FEW WBC SEEN FEW GRAM POSITIVE RODS MODERATE GRAM POSITIVE COCCI Performed at [facility], [place] [address] [place], [place] [address]    Culture PENDING  Incomplete    Report Status PENDING  Incomplete    Coagulation Studies: Recent Labs    04/11/21 1930  LABPROT 18.4*  INR 1.5*    Urinalysis: Recent Labs    04/11/21 2230  COLORURINE YELLOW*  LABSPEC 1.013  PHURINE 7.0  GLUCOSEU NEGATIVE  HGBUR MODERATE*  BILIRUBINUR NEGATIVE  KETONESUR NEGATIVE  PROTEINUR 100*  NITRITE NEGATIVE  LEUKOCYTESUR LARGE*       Imaging: DG Abd 1 View  Result Date: 04/11/2021 CLINICAL DATA:  Enteric catheter placement EXAM: ABDOMEN - 1 VIEW COMPARISON:  04/11/2021 FINDINGS: Frontal view of the lower chest and upper abdomen demonstrates enteric catheter tip and side port projecting over the gastric body. Catheter overlying the right mid abdomen may reflect the superior extent of a right femoral catheter, in the region of the IVC. Pigtail drainage catheter overlies right upper quadrant. Bowel gas pattern is unremarkable. Persistent left basilar consolidation and effusion. IMPRESSION: 1. Enteric catheter tip and side port projecting over the gastric body. Electronically Signed   By: Randa Ngo M.D.   On: 04/11/2021 21:59   CT HEAD WO CONTRAST (5MM)  Result Date: 04/12/2021 CLINICAL DATA:  Altered mental status. EXAM: CT HEAD WITHOUT CONTRAST TECHNIQUE: Contiguous axial images were obtained from the base of the skull through the vertex without intravenous contrast. COMPARISON:  None. 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 Metallic surgical clips with associated streak artifact noted in this area. There is no acute intracranial hemorrhage. No mass effect midline shift no extra-axial fluid collection. Vascular: No hyperdense vessel or unexpected calcification. Skull: No acute finding.  Left posterior parietal craniotomy. Sinuses/Orbits: Partially visualized 3 cm left maxillary sinus retention cyst or polyp. No air-fluid level. The mastoid air cells are clear. Other: None  IMPRESSION: 1. No acute intracranial pathology. 2. 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 Electronically Signed   By: Anner Crete M.D.   On: 04/12/2021 01:27   DG Chest Port 1 View  Result Date: 04/11/2021 CLINICAL DATA:  Intubated, previous abnormal chest x-ray EXAM: PORTABLE CHEST 1 VIEW COMPARISON:  04/09/2021 FINDINGS: Single frontal view of the chest demonstrates endotracheal tube overlying tracheal air column tip just below thoracic inlet. Stable right  internal jugular dialysis catheter. External defibrillator pads overlie the chest. Cardiac silhouette remains enlarged. There is progressive left basilar consolidation and effusion since prior study. Mild central vascular congestion. No pneumothorax identified on this supine exam. No acute bony abnormalities. IMPRESSION: 1. No complication after intubation. 2. Progressive left basilar consolidation and left effusion. Electronically Signed   By: Randa Ngo M.D.   On: 04/11/2021 18:35     Medications:    doxycycline (VIBRAMYCIN) IV     propofol (DIPRIVAN) infusion Stopped (04/12/21 0735)    (feeding supplement) PROSource Plus  30 mL Oral TID BM   amLODipine  10 mg Oral Daily   carvedilol  3.125 mg Oral BID WC   chlorhexidine gluconate (MEDLINE KIT)  15 mL Mouth Rinse BID   Chlorhexidine Gluconate Cloth  6 each Topical Q0600   docusate  100 mg Per Tube BID   feeding supplement (NEPRO CARB STEADY)  237 mL Oral BID BM   heparin  5,000 Units Subcutaneous Q8H   mouth rinse  15 mL Mouth Rinse 10 times per day   multivitamin  1 tablet Oral QHS   pantoprazole (PROTONIX) IV  40 mg Intravenous Q24H   patiromer  16.8 g Oral Daily   polyethylene glycol  17 g Per Tube Daily   sevelamer carbonate  800 mg Oral TID WC   sodium bicarbonate  50 mEq Intravenous Once   acetaminophen **OR** acetaminophen, ondansetron **OR** ondansetron (ZOFRAN)  IV  Assessment/ Plan:  [name] is a 50 y.o. 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   -Cardiac arrest/CPR during HD on November 21.  Received about 2 L of fluid during resuscitation.  -Trial of IHD today to see how he tolerates it.  Volume overload Blood pressure low normal at present.   Anemia with chronic kidney disease: hemoglobin    Lab Results  Component Value Date   HGB 9.0 (L) 04/12/2021     Secondary Hyperparathyroidism: with hyperphosphatemia   Lab Results  Component Value Date   CALCIUM 8.6 (L) 04/12/2021   CAION 0.76 (LL) 08/07/2019   PHOS 7.1 (H) 04/12/2021    - Continue sevelamer with meals when able to eat.   LOS: 2 Jaedah Lords 11/22/202210:48 AM

## 2021-04-12 NOTE — Consult Note (Signed)
Consultation Note Date: 04/12/2021   Patient Name: Larry Morrison  DOB: Jul 01, 1970  MRN: [id number]  Age / Sex: 50 y.o., male  PCP: [facility] [place] Referring Physician: Ottie Glazier, MD  Reason for Consultation: Establishing goals of care  HPI/Patient Profile: 49 y.o. male  with past medical history of ESRD (HD on MWF, patient known to miss sessions), learning disability, aneurysm with craniotomy, CVA (LE residual weakness), wheelchair-bound, obstructive uropathy (urostomy tube), severe protein malnutrition, elevated troponin, anemia of chronic disease, tobacco abuse, history of TBI, and known medication noncompliance admitted on 04/09/2021 with shortness of breath, hyperkalemia, pleural effusion, and suspected fluid overload.  During hemodialysis on 11/21, patient had a PEA arrest requiring CPR and intubation.  UDS was positive for cocaine and opiates.  Patient is currently intubated and in the ICU.  He is off of sedation and able to track.   [facility] of [facility] is petitioning for guardianship.   Palliative medicine was consulted to discuss goals of care.  Clinical Assessment and Goals of Care: I have reviewed medical records including EPIC notes, labs and imaging, assessed the patient and then met with patient and his Sister Larry Morrison at bedside to discuss diagnosis prognosis, [place], EOL wishes, disposition and options.  Monica reached their mother Larry Morrison and put her on speaker phone for the duration of our discussion as well.  As stated above patient is intubated and unable to participate in discussion.  I introduced Palliative Medicine as specialized medical care for people living with serious illness. It focuses on providing relief from the symptoms and stress of a serious illness. The goal is to improve quality of life for both the patient and the family.  We discussed a brief life review of the  patient.  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  She said she is very proud of him.  She reports she loves all of her children.  She would not give any other specifics about [name]'s most recent or past history.  As far as functional and nutritional status prior to admission patient is wheelchair bound at baseline.  Neither mother Larry Morrison or Sister Larry Morrison could share specifics about his nutritional or cognitive status prior to hospitalization.  We discussed patient's current illness and what it means in the larger context of patient's on-going co-morbidities.  Natural disease trajectory and expectations at EOL were discussed. I attempted to elicit values and goals of care important to the patient.  Patient's mother shared she just wants him to get better.  She has every when she knows praying for him and believes that God will carry him through.  Discussed human mortality and the limitations of medical interventions to prolong life when the patient is noncompliant with medication adherence and does not participate in hemodialysis or other recommended medical treatments.  I highlighted that while it is a good thing that [place] survived his cardiac arrest he still has a long way to go as far as recovering.  I gave a brief medical review of his kidney problems, malnutrition, and medication noncompliance.  I discussed that these factors will certainly continue to impact him as he recovers.  Larry Morrison seems surprised to learn that his condition was such.  She asked if there was fluid on him or if he was having a heart problem.  I again reiterated that he has not been taking hemodialysis treatments and not taking medications as prescribed.  Our discussion was informative I am unclear on the health literacy of both patient's mother Larry Morrison and patient's sister [place]. Discussed with patient/family the importance of continued conversation with family and  the medical providers regarding overall plan of care and treatment options, ensuring decisions are within the context of the patient's values and GOCs.    Questions and concerns were addressed. The family was encouraged to call with questions or concerns.   Primary Decision Maker NEXT OF KIN  Code Status/Advance Care Planning: Full code Treat the treatable Time for outcomes  Prognosis:   Unable to determine  Discharge Planning: To Be Determined  Primary Diagnoses: Present on Admission:  Acute on chronic combined systolic and diastolic CHF (congestive heart failure) (HCC)  Tobacco use disorder  Pleural effusion on left  Essential hypertension  ESRD (end stage renal disease) (HCC)  Cognitive impairment  Anemia in ESRD (end-stage renal disease) (HCC)  Elevated troponin  Hyperkalemia  Malnutrition of moderate degree  Cardiac arrest [facility])   Physical Exam Vitals and nursing note reviewed.  Constitutional:      Comments: Frail/malnutritioned  HENT:     Head: Normocephalic and atraumatic.     Mouth/Throat:     Comments: Poor dentition, missing teeth Cardiovascular:     Rate and Rhythm: Normal rate.  Pulmonary:     Effort: Pulmonary effort is normal.     Comments: MV Abdominal:     Palpations: Abdomen is soft.  Skin:    General: Skin is dry.  Neurological:     Comments: MV - unable to verbalize  Psychiatric:        Mood and Affect: Mood is not anxious.        Behavior: Behavior is not agitated.    Vital Signs: BP 96/69 (BP Location: Right Arm)   Pulse 64   Temp 98.3 F (36.8 C) (Axillary)   Resp 18   Ht 5' 2.01" (1.575 m)   Wt 49.1 kg   SpO2 100%   BMI 19.79 kg/m  Pain Scale: CPOT   Pain Score: 0-No pain SpO2: SpO2: 100 % O2 Device:SpO2: 100 % O2 Flow Rate: .O2 Flow Rate (L/min): 3 L/min  I discussed this patient's plan of care with patient, patient's mother, patient's sister, RN Margreta Journey.  Thank you for this consult. Palliative medicine will  continue to follow and assist holistically.   Time Total: 70 minutes Greater than 50%  of this time was spent counseling and coordinating care related to the above assessment and plan.  Signed by: Jordan Hawks, DNP, FNP-BC Palliative Medicine    Please contact Palliative Medicine Team phone at [phone] for questions and concerns.  For individual provider: See Shea Evans

## 2021-04-12 NOTE — Progress Notes (Addendum)
Nutrition Follow-up  DOCUMENTATION CODES:   Non-severe (moderate) malnutrition in context of chronic illness  INTERVENTION:   If tube feeds initiated, recommend:   Vital 1.2@ 14m/hr- Initiate at 255mhr and increase by 1076mr q 8 hours until goal rate is reached.   Free water flushes 12m95m hours to maintain tube patency   Regimen provides 1440kcal/day, 90g/day protein and 1153ml68m of free water   Rena-vit daily via tube   Vitamin C 500mg 7mID  Pt at moderate refeed risk; recommend monitor potassium, magnesium and phosphorus labs daily until stable  NUTRITION DIAGNOSIS:   Moderate Malnutrition related to chronic illness (ESRD on HD) as evidenced by mild fat depletion, mild muscle depletion, moderate muscle depletion. 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  Pt sedated and ventilated. OGT in place. Plan is for HD today with possible extubation afterwards. Will plan to initiate tube feeds if pt does not extubate. Pt eating 50% of meals prior to intubation.   Medications reviewed and include: colace, heparin, rena-vit, protonix, veltassa, miralax, renvela, doxycycline  Labs reviewed: K 5.3(H), BUN 73(H), creat 7.96(H), P 7.1(H), Mg 2.5(H) Hgb 9.0(L), Hct 28.6(L)  Patient is currently intubated on ventilator support MV: 9.4 L/min Temp (24hrs), Avg:97.7 F (36.5 C), Min:95.2 F (35.1 C), Max:98.5 F (36.9 C)  Propofol: none   MAP- >65mmHg48mOP- 100ml   58m Order:   Diet Order     None      EDUCATION  NEEDS:   Education needs have been addressed  Skin:  Skin Assessment: Reviewed RN Assessment  Last BM:  11/22  Height:   Ht Readings from Last 1 Encounters:  04/12/21 5' 2.01" (1.575 m)    Weight:   Wt Readings from Last 1 Encounters:  04/11/21 49.1 kg    Ideal Body Weight:  53.6 kg  BMI:  Body mass index is 19.79 kg/m.  Estimated Nutritional Needs:   Kcal:  1416kcal/day  Protein:  80-90g/day  Fluid:  1000 ml + UOP  Erling Arrazola CaKoleen Distance LDN Please refer to AM[facility] fo[facility] Centerand/or RD on-call/weekend/after hours pager

## 2021-04-12 NOTE — TOC Progression Note (Signed)
Transition of Care [facility]) - Progression Note    Patient Details  Name: Jarquis Engel MRN: [id number] Date of Birth: August 30, 1970  Transition of [facility]) CM/SW Contact  Shelbie Hutching, RN Phone Number: 04/12/2021, 1:06 PM  Clinical Narrative:    Patient admitted to the hospital for shortness of breath, pleural effusion, missed dialysis on Friday last week.  Patient went into cardiac arrest yesterday during dialysis and required CPR and intubation.   Patient currently in the ICU intubated.  Patient is wheelchair bound at home and uses [employer] for transportation, he has dialysis  MWF at [facility], per dialysis coordinator patient is known to miss treatments. TOC will follow hospital course and assist with disposition.         Expected Discharge Plan and Services                                                 Social Determinants of Health (SDOH) Interventions    Readmission Risk Interventions Readmission Risk Prevention Plan 04/12/2021 09/08/2020  Transportation Screening Complete Complete  Social Work Consult for [place] Planning/Counseling - Complete  Palliative Care Screening - Complete  Medication Review Press photographer) Complete Complete  PCP or Specialist appointment within 3-5 days of discharge Complete -  [place] or [place] Complete -  [place] or [place] Pt Refusal Comments has in home care aids -  SW Recovery Care/Counseling Consult Not Complete -  SW Consult Not Complete Comments pt intubated -  Palliative Care Screening Not Complete -  Comments intubated -  Skilled Nursing Facility Not Complete -  SNF Comments intubated -  Some recent data might be hidden

## 2021-04-12 NOTE — Progress Notes (Signed)
*  PRELIMINARY RESULTS* Echocardiogram 2D Echocardiogram has been performed.  Larry Morrison 04/12/2021, 11:30 AM

## 2021-04-12 NOTE — TOC Progression Note (Signed)
Transition of Care [facility]) - Progression Note    Patient Details  Name: Larry Morrison MRN: [id number] Date of Birth: December 17, 1970  Transition of Care [facility]) CM/SW Contact  Shelbie Hutching, RN Phone Number: 04/12/2021, 1:28 PM  Clinical Narrative:    Patient is being followed by [place], they are petitioning for guardianship.  Current assigned case worker is [phone].        Expected Discharge Plan and Services                                                 Social Determinants of Health (SDOH) Interventions    Readmission Risk Interventions Readmission Risk Prevention Plan 04/12/2021 09/08/2020  Transportation Screening Complete Complete  Social Work Consult for [place] Planning/Counseling - Complete  Palliative Care Screening - Complete  Medication Review Press photographer) Complete Complete  PCP or Specialist appointment within 3-5 days of discharge Complete -  [place] or [place] Complete -  [place] or [place] Pt Refusal Comments has in home care aids -  SW Recovery Care/Counseling Consult Not Complete -  SW Consult Not Complete Comments pt intubated -  Palliative Care Screening Not Complete -  Comments intubated -  Skilled Nursing Facility Not Complete -  SNF Comments intubated -  Some recent data might be hidden

## 2021-04-12 NOTE — Progress Notes (Signed)
Pt transported to CT on the vent and returned to ICU 6 without incident. Pt remains on the vent and is tol well at this time.

## 2021-04-13 ENCOUNTER — Inpatient Hospital Stay: Payer: Medicaid Other

## 2021-04-13 LAB — BLOOD GAS, ARTERIAL
Acid-base deficit: 3.2 mmol/L — ABNORMAL HIGH (ref 0.0–2.0)
Bicarbonate: 21.1 mmol/L (ref 20.0–28.0)
FIO2: 0.3
MECHVT: 450 mL
Mechanical Rate: 18
O2 Saturation: 99 %
PEEP: 5 cmH2O
Patient temperature: 37
pCO2 arterial: 34 mmHg (ref 32.0–48.0)
pH, Arterial: 7.4 (ref 7.350–7.450)
pO2, Arterial: 131 mmHg — ABNORMAL HIGH (ref 83.0–108.0)

## 2021-04-13 LAB — GLUCOSE, CAPILLARY
Glucose-Capillary: 103 mg/dL — ABNORMAL HIGH (ref 70–99)
Glucose-Capillary: 111 mg/dL — ABNORMAL HIGH (ref 70–99)
Glucose-Capillary: 124 mg/dL — ABNORMAL HIGH (ref 70–99)
Glucose-Capillary: 154 mg/dL — ABNORMAL HIGH (ref 70–99)
Glucose-Capillary: 51 mg/dL — ABNORMAL LOW (ref 70–99)
Glucose-Capillary: 51 mg/dL — ABNORMAL LOW (ref 70–99)
Glucose-Capillary: 67 mg/dL — ABNORMAL LOW (ref 70–99)
Glucose-Capillary: 67 mg/dL — ABNORMAL LOW (ref 70–99)
Glucose-Capillary: 70 mg/dL (ref 70–99)
Glucose-Capillary: 84 mg/dL (ref 70–99)
Glucose-Capillary: 85 mg/dL (ref 70–99)
Glucose-Capillary: 88 mg/dL (ref 70–99)
Glucose-Capillary: 95 mg/dL (ref 70–99)

## 2021-04-13 LAB — RENAL FUNCTION PANEL
Albumin: 2.1 g/dL — ABNORMAL LOW (ref 3.5–5.0)
Anion gap: 11 (ref 5–15)
BUN: 72 mg/dL — ABNORMAL HIGH (ref 6–20)
CO2: 22 mmol/L (ref 22–32)
Calcium: 8 mg/dL — ABNORMAL LOW (ref 8.9–10.3)
Chloride: 104 mmol/L (ref 98–111)
Creatinine, Ser: 7.71 mg/dL — ABNORMAL HIGH (ref 0.61–1.24)
GFR, Estimated: 8 mL/min — ABNORMAL LOW (ref >60)
Glucose, Bld: 74 mg/dL (ref 70–99)
Phosphorus: 7.1 mg/dL — ABNORMAL HIGH (ref 2.5–4.6)
Potassium: 4.6 mmol/L (ref 3.5–5.1)
Sodium: 137 mmol/L (ref 135–145)

## 2021-04-13 LAB — CBC
HCT: 27.2 % — ABNORMAL LOW (ref 39.0–52.0)
Hemoglobin: 8.7 g/dL — ABNORMAL LOW (ref 13.0–17.0)
MCH: 31.4 pg (ref 26.0–34.0)
MCHC: 32 g/dL (ref 30.0–36.0)
MCV: 98.2 fL (ref 80.0–100.0)
Platelets: 174 10*3/uL (ref 150–400)
RBC: 2.77 MIL/uL — ABNORMAL LOW (ref 4.22–5.81)
RDW: 16.8 % — ABNORMAL HIGH (ref 11.5–15.5)
WBC: 5.2 10*3/uL (ref 4.0–10.5)
nRBC: 0 % (ref 0.0–0.2)

## 2021-04-13 LAB — LEGIONELLA PNEUMOPHILA SEROGP 1 UR AG: L. pneumophila Serogp 1 Ur Ag: NEGATIVE

## 2021-04-13 MED ORDER — ALTEPLASE 2 MG IJ SOLR
INTRAMUSCULAR | Status: AC
  Administered 2021-04-13: 2 mg
  Filled 2021-04-13: qty 4

## 2021-04-13 MED ORDER — OSMOLITE 1.2 CAL PO LIQD: 1000.0000 mL | ORAL | Status: DC

## 2021-04-13 MED ORDER — DEXTROSE 50 % IV SOLN
INTRAVENOUS | Status: AC
  Filled 2021-04-13: qty 50

## 2021-04-13 MED ORDER — HEPARIN SODIUM (PORCINE) 1000 UNIT/ML IJ SOLN
INTRAMUSCULAR | Status: AC
  Filled 2021-04-13: qty 1

## 2021-04-13 MED ORDER — DEXTROSE 50 % IV SOLN
12.5000 g | Freq: Once | INTRAVENOUS | Status: AC
  Administered 2021-04-13: 12.5 g via INTRAVENOUS

## 2021-04-13 MED ORDER — PIPERACILLIN-TAZOBACTAM IN DEX 2-0.25 GM/50ML IV SOLN
2.2500 g | Freq: Three times a day (TID) | INTRAVENOUS | Status: AC
  Administered 2021-04-13 – 2021-04-17 (×13): 2.25 g via INTRAVENOUS
  Filled 2021-04-13 (×16): qty 50

## 2021-04-13 MED ORDER — VANCOMYCIN HCL 500 MG/100ML IV SOLN
500.0000 mg | Freq: Once | INTRAVENOUS | Status: AC
  Administered 2021-04-13: 500 mg via INTRAVENOUS
  Filled 2021-04-13: qty 100

## 2021-04-13 MED ORDER — VITAL AF 1.2 CAL PO LIQD
1000.0000 mL | ORAL | Status: DC
Start: 2021-04-13 — End: 2021-04-15
  Administered 2021-04-13 – 2021-04-14 (×2): 1000 mL

## 2021-04-13 NOTE — Progress Notes (Signed)
NAME:  Larry Morrison, MRN:  [id number], DOB:  Apr 26, 1971, LOS: 3 ADMISSION DATE:  04/09/2021, CONSULTATION DATE:  04/11/21 REFERRING MD:  Berle Mull, MD  CHIEF COMPLAINT:  Cardiac Arrest   HPI  50 y.o with significant PMH of ESRD on HD who presented to the ED after an accidental fall at home due to falling asleep in his wheelchair.  ED Course: On arrival to the ED, he was afebrile with blood pressure  mm Hg and pulse rate  beats/min. There were no focal neurological deficits;  Pertinent Labs in Red/Diagnostics Findings: Na+/ K+: 141/4.9 Glucose: 86 BUN/Cr: 79/8.68 Calcium: 8.6  WBC/ TMAX: 7.8/ afebrile Hgb/Hct: 11.1/35.6 Plts: 233 PCT:2.88 COVID PCR: Negative   Troponin: 106 BNP: >4500 EKG: Normal sinus rhythm, right axis deviation, prolonged QT interval, no acute ischemic changes CXR: Increase in left pleural effusion and possibly underlying atelectasis/pneumonia  Patient was admitted to [facility] unit under hospitalist service for volume overload in the setting of  pulmonary congestion, pleural effusion and severe combined heart failure.  Apparently pt missed  HD Session on 04/08/2021 and had incomplete session on 04/06/21. Patient received IV Lasix 80 mg x 1 and nephrology consulted.  Hospital Course: During the course of this admission, patient was evaluated by nephrology who felt there was no indication for dialysis at that time and resume his HD schedule MWF.  Patient was also started on his home meds carvedilol, Lasix and losartan for hypertensive urgency.  Patient  was noted with hyperkalemia  and required HD on 11/20 but he decline the session. He did finally agreed to HD today but was unable to complete the full session.  After an hour in HD, patient became unresponsive and went into pulseless V. tach and asystole  requiring CPR and defibrillation.  Patient intubated post ROSC and transferred to the ICU.  PCCM consulted.  Past Medical History   Cardiomyopathy     Protein-calorie malnutrition, severe 03/01/2021  Arteriovenous fistula occlusion (HCC)    Pleural effusion 02/28/2021  Arteriovenous fistula thrombosis (HCC) 70/35/0093  Metabolic acidosis 81/82/9937  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 02/28/2021  History of traumatic brain injury 09/07/2020  Cognitive impairment 16/96/7893  Metabolic encephalopathy 81/05/7508  ESRD needing dialysis ([place]) 09/04/2020  Hypoglycemia 09/04/2020  Uremia 09/04/2020  COVID-19 virus infection 06/18/2020  Stroke ([place]) 06/18/2020  Thrombocytopenia ([place]) 06/18/2020  Elevated troponin 06/18/2020  Hyperkalemia 06/13/2020  Anemia in ESRD (end-stage renal disease) ([place]) 09/17/2019  Essential hypertension 07/15/2019  Testicular torsion    Pyuria 06/10/2016  Chronic pain following surgery or procedure 12/01/2015  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 11/20: Hyperkalemia with potassium of 6.2 getting [place].  Patient refusing dialysis today. 11/21: Patient went into PEA Cardiac arrest during HD session, intubated post ROSC and transferred to ICU. PCCM consulted 04/13/21- patient for SBT today to liberate from MV. Plan for HD today as well.    Consults:  Nephrology PCCM  Procedures:  11/21: intubation 11/21: Right Femoral vein central line  Significant Diagnostic Tests:  11/21: Chest Xray>Progressive left basilar consolidation and left effusion. 11/21: Abdominal xray>Enteric catheter tip and side port projecting over  the gastric body. Bowel gas pattern is  unremarkable. Persistent left basilar consolidation and effusion. 11/21: Noncontrast CT head> 11/19: Chest Xray>Cardiomegaly. Increased homogeneous opacity is seen in the left mid and left lower lung fields suggesting increase in left pleural effusion and possibly underlying atelectasis/pneumonia. Central pulmonary vessels are slightly less prominent. There are no signs of alveolar pulmonary edema in the right lung.    Micro Data:  11/19: SARS-CoV-2 PCR> negative 11/19: Influenza PCR> negative 11/21: Blood culture x2> 11/21: Urine Culture> 11/21: Tracheal aspirate> 11/21: MRSA PCR>>  11/21: Strep pneumo urinary antigen> 11/21: Legionella urinary antigen>  Antimicrobials:  Vancomycin 11/21> Cefepime 11/21>  OBJECTIVE  Blood pressure 120/81, pulse 69, temperature 98 F (36.7 C), temperature source Axillary, resp. rate (!) 22, height 5' 2.01" (1.575 m), weight 49.1 kg, SpO2 96 %.    Vent Mode: PRVC FiO2 (%):  [24 %-30 %] 24 % Set Rate:  [18 bmp] 18 bmp Vt Set:  [450 mL] 450 mL PEEP:  [5 cmH20-8 cmH20] 5 cmH20   Intake/Output Summary (Last 24 hours) at 04/13/2021 1011 Last data filed at 04/13/2021 0914 Gross per 24 hour  Intake 592.82 ml  Output 180 ml  Net 412.82 ml    Filed Weights   04/09/21 1530 04/11/21 1525 04/12/21 1645  Weight: 68 kg 49.1 kg 49.1 kg   Physical Examination  GENERAL: 50 year-old critically ill patient lying in the bed intubated, mechanically ventilated and sedated EYES: Pupils equal, round, reactive to light and accommodation. No scleral icterus. Extraocular muscles intact.  HEENT: Head atraumatic, normocephalic. Oropharynx and nasopharynx clear.  NECK:  Supple, no jugular venous distention. No thyroid enlargement, no tenderness.  LUNGS: Decreased breath sounds bilaterally, no wheezing, rales, moderate rhonchi. No crepitation. No use of accessory muscles of respiration.  CARDIOVASCULAR: S1, S2 normal. No murmurs, rubs, or gallops.  ABDOMEN: Soft,  nontender, nondistended. Bowel sounds present. No organomegaly or mass.  Nephrostomy tube in place draining yellow urine EXTREMITIES: No pedal edema, cyanosis, or clubbing.  NEUROLOGIC: Cranial nerves II through XII are intact.  Muscle strength NOT checked. Sensation intact. Gait not checked.  PSYCHIATRIC: The patient is intubated and sedated SKIN: No obvious rash, lesion, or ulcer.   Labs/imaging that I havepersonally reviewed  (right click and "Reselect all SmartList Selections" daily)     Labs   CBC: Recent Labs  Lab 04/11/21 0347 04/11/21 1449 04/11/21 1930 04/12/21 0540 04/13/21 0450  WBC 6.4 5.9 9.1 6.5 5.2  NEUTROABS  --  4.2  --   --   --   HGB 10.3* 10.0* 9.0* 9.0* 8.7*  HCT 32.0* 30.8* 29.2* 28.6* 27.2*  MCV 96.7 96.3 98.3 97.3 98.2  PLT 230 237 189 187 174     Basic Metabolic Panel: Recent Labs  Lab 04/09/21 1653 04/10/21 0746 04/11/21 1449 04/11/21 1930 04/12/21 0540 04/13/21 0450  NA 141 139 140 141 139 137  K 4.9 6.2* 6.1* 4.7 5.3* 4.6  CL 104 105 105 106 104 104  CO2 22 19* 19* 21* 23 22  GLUCOSE 86 82 122* 92 89 74  BUN 79* 87* 100* 71* 73* 72*  CREATININE 8.68* 9.10* 10.31* 7.45* 7.96* 7.71*  CALCIUM 8.6* 8.5* 8.4* 9.0 8.6* 8.0*  MG 2.3  --   --   --  2.5*  --   PHOS 7.8*  --  9.6*  --  7.1* 7.1*    GFR: Estimated Creatinine Clearance: 7.8 mL/min (A) (by C-G formula based on SCr of 7.71 mg/dL (H)).  Recent Labs  Lab 04/09/21 1844 04/10/21 0746 04/11/21 0347 04/11/21 1449 04/11/21 1930 04/11/21 2205 04/12/21 0540 04/13/21 0450  PROCALCITON 2.88 3.03 3.54  --   --   --   --   --   WBC  --  7.4 6.4 5.9 9.1  --  6.5 5.2  LATICACIDVEN  --   --   --   --   --  1.5  --   --      Liver Function Tests: Recent Labs  Lab 04/11/21 1449 04/11/21 1930 04/13/21 0450  AST  --  26  --   ALT  --  15  --   ALKPHOS  --  105  --   BILITOT  --  1.3*  --   PROT  --  7.3  --   ALBUMIN 2.5* 2.3* 2.1*    No results for input(s): LIPASE, AMYLASE  in the last 168 hours. No results for input(s): AMMONIA in the last 168 hours.  ABG    Component Value Date/Time   PHART 7.40 04/13/2021 0530   PCO2ART 34 04/13/2021 0530   PO2ART 131 (H) 04/13/2021 0530   HCO3 21.1 04/13/2021 0530   TCO2 27 08/07/2019 1018   ACIDBASEDEF 3.2 (H) 04/13/2021 0530   O2SAT 99.0 04/13/2021 0530      Coagulation Profile: Recent Labs  Lab 04/11/21 1930  INR 1.5*     Cardiac Enzymes: No results for input(s): CKTOTAL, CKMB, CKMBINDEX, TROPONINI in the last 168 hours.  HbA1C: Hgb A1c MFr Bld  Date/Time Value Ref Range Status  09/05/2020 05:00 AM 5.0 4.8 - 5.6 % Final    Comment:    (NOTE)         Prediabetes: 5.7 - 6.4         Diabetes: >6.4         Glycemic control for adults with diabetes: <7.0     CBG: Recent Labs  Lab 04/11/21 1755 04/13/21 0859 04/13/21 0933 04/13/21 1002  GLUCAP 75 51* 95 154*     Review of Systems:   UNABLE TO ASSESS PATIENT IS INTUBATED AND SEDATED  Past Medical History  He,  has a past medical history of Anemia, Aneurysm ([place]), Dialysis patient ([place]), Dyspnea, Foot drop, History of kidney stones, History of nephrostomy, Hypertension, Renal disorder, and Stroke ([place]).   Surgical History    Past Surgical History:  Procedure Laterality Date   A/V SHUNT INTERVENTION N/A 03/01/2021   Procedure: A/V SHUNT INTERVENTION;  Surgeon: Katha Cabal, MD;  Location: [place] [facility];  Service: Cardiovascular;  Laterality: N/A;   AV FISTULA PLACEMENT Right 08/07/2019   Procedure: ARTERIOVENOUS (AV) FISTULA CREATION;  Surgeon: Algernon Huxley, MD;  Location: [facility];  Service: Vascular;  Laterality: Right;   AV FISTULA PLACEMENT Right 11/20/2019   Procedure: INSERTION OF ARTERIOVENOUS (AV) GORE-TEX GRAFT ARM;  Surgeon: Algernon Huxley, MD;  Location: [facility];  Service: Vascular;  Laterality: Right;   IR NEPHROSTOMY EXCHANGE RIGHT  09/10/2020   NEPHRECTOMY Left    NEPHRECTOMY     ORCHIECTOMY Right 10/27/2016    Procedure: PSB ORCHIECTOMY;  Surgeon: Hollice Espy, MD;  Location: [facility];  Service: Urology;  Laterality: Right;   ORCHIOPEXY Bilateral 10/27/2016   Procedure: ORCHIOPEXY ADULT;  Surgeon: Hollice Espy, MD;  Location: [facility];  Service: Urology;  Laterality: Bilateral;   SCROTAL EXPLORATION Bilateral 10/27/2016   Procedure: SCROTUM EXPLORATION;  Surgeon: Hollice Espy, MD;  Location: [facility];  Service: Urology;  Laterality: Bilateral;     Social History   reports that he has quit smoking. His smoking use included cigarettes. He smoked an average of .5 packs per day. He has never used smokeless tobacco. He reports current drug use. Drug: Marijuana. He reports that he does not drink alcohol.   Family History   His Family history is unknown by patient.   Allergies Allergies  Allergen Reactions   Vancomycin     Patient denies - repeated denial to pharm tech 09-05-2020     Home Medications  Prior to Admission medications   Medication Sig Start Date End Date Taking? Authorizing Provider  aspirin EC 81 MG EC tablet Take 1 tablet (81 mg total) by mouth daily. Swallow whole. 03/04/21   Enzo Bi, MD  carvedilol (COREG) 3.125 MG tablet Take 1 tablet (3.125 mg total) by mouth 2 (two) times daily with a meal. Patient not taking: Reported on 04/09/2021 03/03/21 06/01/21  Enzo Bi, MD  feeding supplement (ENSURE ENLIVE / ENSURE PLUS) LIQD Take 237 mLs by mouth daily. Patient not taking: Reported on 04/09/2021 03/03/21   Enzo Bi, MD  losartan (COZAAR) 25 MG tablet Take 1 tablet (25 mg total) by mouth daily. Patient not taking: Reported on 04/09/2021 03/04/21 06/02/21  Enzo Bi, MD  multivitamin (RENA-VIT) TABS tablet Take 1 tablet by mouth at bedtime. Patient not taking: Reported on 04/09/2021 03/03/21   Enzo Bi, MD  sevelamer carbonate (RENVELA) 800 MG tablet Take 1 tablet (800 mg total) by mouth 3 (three) times daily with meals. Patient not taking: Reported on 04/09/2021 03/03/21  06/01/21  Enzo Bi, MD    Scheduled Meds:  amLODipine  10 mg Oral Daily   carvedilol  3.125 mg Oral BID WC   chlorhexidine gluconate (MEDLINE KIT)  15 mL Mouth Rinse BID   Chlorhexidine Gluconate Cloth  6 each Topical Q0600   docusate  100 mg Per Tube BID   heparin  5,000 Units Subcutaneous Q8H   mouth rinse  15 mL Mouth Rinse 10 times per day   multivitamin  1 tablet Oral QHS   pantoprazole (PROTONIX) IV  40 mg Intravenous Q24H   patiromer  16.8 g Oral Daily   polyethylene glycol  17 g Per Tube Daily   sevelamer carbonate  800 mg Oral TID WC   sodium bicarbonate  50 mEq Intravenous Once   Continuous Infusions:  dexmedetomidine (PRECEDEX) IV infusion Stopped (04/12/21 2105)   doxycycline (VIBRAMYCIN) IV 100 mg (04/12/21 2336)   norepinephrine (LEVOPHED) Adult infusion Stopped (04/13/21 0530)   propofol (DIPRIVAN) infusion Stopped (04/12/21 0735)   PRN Meds:.fentaNYL (SUBLIMAZE) injection, fentaNYL (SUBLIMAZE) injection, midazolam, midazolam, ondansetron **OR** ondansetron (ZOFRAN) IV   Active Hospital Problem list   Acute hypoxic respiratory failure Sepsis   Assessment & Plan:  Acute Hypoxic Respiratory Failure in the setting of Pulmonary Edema with Pleural Effusion, Combined severe CHF and probable Pneumonia PMHx: ESRD noncompliance with hemodialysis treatment. -Continue ventilator support & lung protective strategies -Wean PEEP & FiO2 as tolerated, maintain SpO2 > 90% -Head of bed elevated 30 degrees, VAP protocol in place -Plateau pressures less than 30 cm H20  -Intermittent chest x-ray & ABG PRN -Daily WUA with SBT as tolerated  -Ensure adequate pulmonary hygiene  -HD for fluid removal -Budesonide inhaler nebs BID, bronchodilators PRN -PAD protocol in place: continue propofol gtt  PEA Cardiac arrest Suspect in the setting of respiratory arrest from severe metabolic disorder QTc prolongation Elevated Troponin likely demand without dynamic EKG changes Received  1  round of defibrillations UDS + Cocaine -Not meeting criteria for TTM -Echocardiogram ordered -Trend troponin, lactic -Serial EKG  Sepsis with due to suspected Pneumonia Lactic Acidosis Lactic: 1.5, Baseline PCT: 2.88>3.03>3.54, UA: pending, CXR: Progressive left basilar consolidation and left effusion. -Follow blood and Urine cultures, trend lactic/ PCT -Check strep pneumo antigen and Legionella -monitor WBC/ fever curve -Start broad coverage with Cefepime  & vancomycin  -Currently not requiring vasopressors. 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 BNP >4500 Hypertension Urgency PMHx: Cardiomyopathy -Continuous cardiac monitoring -Maintain MAP greater than 65 -HD for fluid removal -Continue Coreg as bp permits. -Hold Losartan for now   ESRD on HD MWF Severe Metabolic Acidosis -Monitor I&O's / urinary output -Follow BMP -Continue Sodium bicarb gtt for now -Ensure adequate renal perfusion -Avoid nephrotoxic agents as able -Replace electrolytes as indicated -HD MWF -Nephrology following, input appreciated   Polysubstance Abuse UDS + Cocaine, Tobacco abuse -Nicotine patch -cessation counseling once able to participate   Best practice:  Diet:  Tube Feed  Pain/Anxiety/Delirium protocol (if indicated): Yes (RASS goal 0) VAP protocol (if indicated): Yes DVT prophylaxis: Subcutaneous Heparin GI prophylaxis: N/A Glucose control:  SSI No Central venous access:  Yes, and it is still needed Arterial line:  N/A Foley:  N/A Mobility:  bed rest  PT consulted: N/A Last date of multidisciplinary goals of care discussion [11/21] Code Status:  full code Disposition: ICU   = Goals of Care = Code Status Order: _0 @   Primary Emergency Contact: [place], Home Phone: [phone] Wishes to pursue full aggressive treatment and intervention options,  including CPR and intubation, but goals of care will be addressed on going with family if that should become necessary.  Critical care provider statement:   Total critical care time: 33 minutes   Performed by: Lanney Gins MD   Critical care time was exclusive of separately billable procedures and treating other patients.   Critical care was necessary to treat or prevent imminent or life-threatening deterioration.   Critical care was time spent personally by me on the following activities: development of treatment plan with patient and/or surrogate as well as nursing, discussions with consultants, evaluation of patient's response to treatment, examination of patient, obtaining history from patient or surrogate, ordering and performing treatments and interventions, ordering and review of laboratory studies, ordering and review of radiographic studies, pulse oximetry and re-evaluation of patient's condition.    Ottie Glazier, M.D.  Pulmonary & Critical Care Medicine

## 2021-04-13 NOTE — Progress Notes (Signed)
Pt alert and unable to follow commands this morning but is now able to stick out tongue and wiggle toes on commands. PRN versed for restlessness. ETT and OG intact. Tolerated HD, VSS throughout. Afebrile. CBGs checked every hr for hypoglycemia and dextrose administered per orders. Tube feeds initiated per verbal order. Nephrostomy intact and pt oliguric, clear output. Family updated from RN standpoint at bedside.

## 2021-04-13 NOTE — Progress Notes (Signed)
Pharmacy Antibiotic Note  Larry Morrison is a 50 y.o. male admitted on 04/09/2021 with pneumonia.  Pharmacy has been consulted for vancomycin and piperacillin/tazobactam dosing.  Presented to ED on 11/19 with shortness of breath after missing HD x 4. While in HD on 11/21, became unresponsive, required CPR and intubated. Now intubated. Concern for HAP with sepsis. Chest xray showing progressive left basilar consolidation and left effusion. PCT 3.54, LA elevated. Trial of HD today 11/23 to assess tolerability.  11/21 HD 11/21 vancomycin 1,000 mg x 1 and cefepime 1 gram x 1 @2315   11/23 HD x 2 hours maintained at BFR 400 mL/min   Plan: Vancomycin Maintenance dose vancomycin 500 mg x 1 Additional doses to be ordered pending HD plans.  Pip/tazo Start piperacillin/tazobactam 2.25 grams every 8 hours  Follow future HD plans, clinical course, LOT  Noted patient on doxycyline 100 mg every 12 hours.  Height: 5' 2.01" (157.5 cm) Weight: 49.1 kg (108 lb 3.9 oz) IBW/kg (Calculated) : 54.62  Temp (24hrs), Avg:97.7 F (36.5 C), Min:97.3 F (36.3 C), Max:98 F (36.7 C)  Recent Labs  Lab 04/10/21 0746 04/11/21 0347 04/11/21 1449 04/11/21 1930 04/11/21 2205 04/12/21 0540 04/13/21 0450  WBC 7.4 6.4 5.9 9.1  --  6.5 5.2  CREATININE 9.10*  --  10.31* 7.45*  --  7.96* 7.71*  LATICACIDVEN  --   --   --   --  1.5  --   --     Estimated Creatinine Clearance: 7.8 mL/min (A) (by C-G formula based on SCr of 7.71 mg/dL (H)).    Allergies  Allergen Reactions   Vancomycin     Patient denies - repeated denial to pharm tech 09-05-2020    Antimicrobials this admission: Vancomycin 11/21 >> 11/21 Cefepime 11/21 >> 11/21  Dose adjustments this admission: None  Microbiology results: 11/21 BCx: NG x 2 days 11/21 UCx: S. Aureus  11/22 Tracheal aspirate: few GPR, moderate GPC  11/21 MRSA PCR: Detected  Thank you for allowing pharmacy to be a part of this patient's care.   Wynelle Cleveland,  PharmD Pharmacy Resident  04/13/2021 7:39 PM

## 2021-04-13 NOTE — Progress Notes (Signed)
[facility]  ROUNDING NOTE   Subjective:   Patient had cardiac arrest during dialysis on monday.  He was intubated and placed on pressors and moved to the ICU. This morning,  he remains intubated. No pressors Last evening, he could not get HD because of catheter malfunction tPA to be instilled in catheter today   Objective:  Vital signs in last 24 hours:  Temp:  [97.2 F (36.2 C)-98.3 F (36.8 C)] 97.8 F (36.6 C) (11/23 0400) Pulse Rate:  [49-76] 69 (11/23 0746) Resp:  [0-33] 18 (11/23 0830) BP: (73-130)/(50-91) 93/63 (11/23 0830) SpO2:  [97 %-100 %] 98 % (11/23 0746) FiO2 (%):  [24 %-30 %] 24 % (11/23 0530) Weight:  [49.1 kg] 49.1 kg (11/22 1645)  Weight change: 0 kg Filed Weights   04/09/21 1530 04/11/21 1525 04/12/21 1645  Weight: 68 kg 49.1 kg 49.1 kg    Intake/Output: I/O last 3 completed shifts: In: 718.5 [I.V.:118.5; IV VHQIONGEX:528] Out: 380 [Urine:280; Emesis/NG output:100]   Intake/Output this shift:  No intake/output data recorded.  Physical Exam: General: Critically ill appearing  Head: ET tube in place  Lungs:  Ventilator assisted, FiO2 24%, decreased breath sounds at bases  Heart: Regular   Abdomen:  Soft, nontender,  Extremities:  no peripheral edema.  Neurologic: Opens eyes, not following commands  Skin: Warm   Access: Right IJ permcath  Right nephrostomy  Basic Metabolic Panel: Recent Labs  Lab 04/09/21 1653 04/10/21 0746 04/11/21 1449 04/11/21 1930 04/12/21 0540 04/13/21 0450  NA 141 139 140 141 139 137  K 4.9 6.2* 6.1* 4.7 5.3* 4.6  CL 104 105 105 106 104 104  CO2 22 19* 19* 21* 23 22  GLUCOSE 86 82 122* 92 89 74  BUN 79* 87* 100* 71* 73* 72*  CREATININE 8.68* 9.10* 10.31* 7.45* 7.96* 7.71*  CALCIUM 8.6* 8.5* 8.4* 9.0 8.6* 8.0*  MG 2.3  --   --   --  2.5*  --   PHOS 7.8*  --  9.6*  --  7.1* 7.1*     Liver Function Tests: Recent Labs  Lab 04/11/21 1449 04/11/21 1930 04/13/21 0450  AST  --  26  --   ALT   --  15  --   ALKPHOS  --  105  --   BILITOT  --  1.3*  --   PROT  --  7.3  --   ALBUMIN 2.5* 2.3* 2.1*    No results for input(s): LIPASE, AMYLASE in the last 168 hours. No results for input(s): AMMONIA in the last 168 hours.  CBC: Recent Labs  Lab 04/11/21 0347 04/11/21 1449 04/11/21 1930 04/12/21 0540 04/13/21 0450  WBC 6.4 5.9 9.1 6.5 5.2  NEUTROABS  --  4.2  --   --   --   HGB 10.3* 10.0* 9.0* 9.0* 8.7*  HCT 32.0* 30.8* 29.2* 28.6* 27.2*  MCV 96.7 96.3 98.3 97.3 98.2  PLT 230 237 189 187 174     Cardiac Enzymes: No results for input(s): CKTOTAL, CKMB, CKMBINDEX, TROPONINI in the last 168 hours.  BNP: Invalid input(s): POCBNP  CBG: Recent Labs  Lab 04/11/21 1755  GLUCAP 75     Microbiology: Results for orders placed or performed during the hospital encounter of 04/09/21  Resp Panel by RT-PCR (Flu A&B, Covid) Nasopharyngeal Swab     Status: None   Collection Time: 04/09/21  7:29 PM   Specimen: Nasopharyngeal Swab; Nasopharyngeal(NP) swabs in vial transport medium  Result Value  Ref Range Status   SARS Coronavirus 2 by RT PCR NEGATIVE NEGATIVE Final    Comment: (NOTE) SARS-CoV-2 target nucleic acids are NOT DETECTED.  The SARS-CoV-2 RNA is generally detectable in upper respiratory specimens during the acute phase of infection. The lowest concentration of SARS-CoV-2 viral copies this assay can detect is 138 copies/mL. A negative result does not preclude SARS-Cov-2 infection and should not be used as the sole basis for treatment or other patient management decisions. A negative result may occur with  improper specimen collection/handling, submission of specimen other than nasopharyngeal swab, presence of viral mutation(s) within the areas targeted by this assay, and inadequate number of viral copies(<138 copies/mL). A negative result must be combined with clinical observations, patient history, and epidemiological information. The expected result is  Negative.  Fact Sheet for Patients:  EntrepreneurPulse.com.au  Fact Sheet for Healthcare Providers:  IncredibleEmployment.be  This test is no t yet approved or cleared by the [place] FDA and  has been authorized for detection and/or diagnosis of SARS-CoV-2 by FDA under an Emergency Use Authorization (EUA). This EUA will remain  in effect (meaning this test can be used) for the duration of the COVID-19 declaration under Section 564(b)(1) of the Act, 21 U.S.C.section 360bbb-3(b)(1), unless the authorization is terminated  or revoked sooner.       Influenza A by PCR NEGATIVE NEGATIVE Final   Influenza B by PCR NEGATIVE NEGATIVE Final    Comment: (NOTE) The Xpert Xpress SARS-CoV-2/FLU/RSV plus assay is intended as an aid in the diagnosis of influenza from Nasopharyngeal swab specimens and should not be used as a sole basis for treatment. Nasal washings and aspirates are unacceptable for Xpert Xpress SARS-CoV-2/FLU/RSV testing.  Fact Sheet for Patients: EntrepreneurPulse.com.au  Fact Sheet for Healthcare Providers: IncredibleEmployment.be  This test is not yet approved or cleared by the [place] FDA and has been authorized for detection and/or diagnosis of SARS-CoV-2 by FDA under an Emergency Use Authorization (EUA). This EUA will remain in effect (meaning this test can be used) for the duration of the COVID-19 declaration under Section 564(b)(1) of the Act, 21 U.S.C. section 360bbb-3(b)(1), unless the authorization is terminated or revoked.  Performed at [facility], [address] [place], [place] [address]   MRSA Next Gen by PCR, Nasal     Status: Abnormal   Collection Time: 04/11/21 10:23 PM   Specimen: Nasal Mucosa; Nasal Swab  Result Value Ref Range Status   MRSA by PCR Next Gen DETECTED (A) NOT DETECTED Final    Comment: RESULT CALLED TO, READ BACK BY AND VERIFIED  WITH: TESSYAMMA THOMAS _0  04/12/21 RH (NOTE) The GeneXpert MRSA Assay (FDA approved for NASAL specimens only), is one component of a comprehensive MRSA colonization surveillance program. It is not intended to diagnose MRSA infection nor to guide or monitor treatment for MRSA infections. Test performance is not FDA approved in patients less than 85 years old. Performed at [facility], [address] [place], [place] [address]   Urine Culture     Status: None (Preliminary result)   Collection Time: 04/11/21 10:30 PM   Specimen: In/Out Cath Urine  Result Value Ref Range Status   Specimen Description   Final    IN/OUT CATH URINE Performed at [facility], [address], [place], [place] [address]    Special Requests   Final    NONE Performed at [facility], [address] [place], [place] [address]    Culture  Final    CULTURE REINCUBATED FOR BETTER GROWTH Performed at [facility], [place] [address], [place], [place] [address]    Report Status PENDING  Incomplete  CULTURE, BLOOD (ROUTINE X 2) w Reflex to ID Panel     Status: None (Preliminary result)   Collection Time: 04/11/21 10:54 PM   Specimen: BLOOD LEFT HAND  Result Value Ref Range Status   Specimen Description BLOOD LEFT HAND  Final   Special Requests   Final    BOTTLES DRAWN AEROBIC ONLY Blood Culture adequate volume   Culture   Final    NO GROWTH < 12 HOURS Performed at [facility], [address], [place], [place] [address]    Report Status PENDING  Incomplete  CULTURE, BLOOD (ROUTINE X 2) w Reflex to ID Panel     Status: None (Preliminary result)   Collection Time: 04/11/21 11:05 PM   Specimen: BLOOD LEFT HAND  Result Value Ref Range Status   Specimen Description BLOOD LEFT HAND  Final   Special Requests   Final    BOTTLES DRAWN AEROBIC ONLY Blood Culture adequate volume   Culture   Final    NO GROWTH < 12 HOURS Performed at [facility], [address], [place], [place] [address]    Report Status PENDING  Incomplete  Culture, Respiratory w Gram Stain     Status: None (Preliminary result)   Collection Time: 04/12/21 12:31 AM   Specimen: Tracheal Aspirate; Respiratory  Result Value Ref Range Status   Specimen Description   Final    TRACHEAL ASPIRATE Performed at [facility], [address], [place], [place] [address]    Special Requests   Final    NONE Performed at [facility], [place], [place] [address]    Gram Stain   Final    FEW SQUAMOUS EPITHELIAL CELLS PRESENT FEW WBC SEEN FEW GRAM POSITIVE RODS MODERATE GRAM POSITIVE COCCI    Culture   Final    CULTURE REINCUBATED FOR BETTER GROWTH Performed at [facility], [place] [address] [place], [place] [address]    Report Status PENDING  Incomplete    Coagulation Studies: Recent Labs    04/11/21 1930  LABPROT 18.4*  INR 1.5*     Urinalysis: Recent Labs    04/11/21 2230  COLORURINE YELLOW*  LABSPEC 1.013  PHURINE 7.0  GLUCOSEU NEGATIVE  HGBUR MODERATE*  BILIRUBINUR NEGATIVE  KETONESUR NEGATIVE  PROTEINUR 100*  NITRITE NEGATIVE  LEUKOCYTESUR LARGE*       Imaging: DG Abd 1 View  Result Date: 04/11/2021 CLINICAL DATA:  Enteric catheter placement EXAM: ABDOMEN - 1 VIEW COMPARISON:  04/11/2021 FINDINGS: Frontal view of the lower chest and upper abdomen demonstrates enteric catheter tip and side port projecting over the gastric body. Catheter overlying the right mid abdomen may reflect the superior extent of a right femoral catheter, in the region of the IVC. Pigtail drainage catheter overlies right upper quadrant. Bowel gas pattern is unremarkable. Persistent left basilar consolidation and effusion. IMPRESSION: 1. Enteric catheter tip and side port projecting over the gastric body. Electronically Signed   By: Randa Ngo M.D.   On: 04/11/2021 21:59   CT HEAD WO CONTRAST (5MM)  Result Date:  04/12/2021 CLINICAL DATA:  Altered mental status. EXAM: CT HEAD WITHOUT CONTRAST TECHNIQUE: Contiguous axial images were obtained from the base of the skull through the vertex without intravenous contrast. COMPARISON:  None. FINDINGS: Brain: There is a  large area of old infarct and encephalomalacia involving the left posterior parietal and occipital lobes. Metallic surgical clips with associated streak artifact noted in this area. There is no acute intracranial hemorrhage. No mass effect midline shift no extra-axial fluid collection. Vascular: No hyperdense vessel or unexpected calcification. Skull: No acute finding.  Left posterior parietal craniotomy. Sinuses/Orbits: Partially visualized 3 cm left maxillary sinus retention cyst or polyp. No air-fluid level. The mastoid air cells are clear. Other: None IMPRESSION: 1. No acute intracranial pathology. 2. 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 Electronically Signed   By: Anner Crete M.D.   On: 04/12/2021 01:27   DG Chest Port 1 View  Result Date: 04/13/2021 CLINICAL DATA:  Acute respiratory failure with hypoxia. EXAM: PORTABLE CHEST 1 VIEW COMPARISON:  One-view chest x-ray VI 21/22 FINDINGS: The endotracheal tube terminates 4.7 cm above the carina. NG tube courses off the inferior border of the film. Right IJ dialysis catheter is stable. The heart is enlarged. Left pleural effusion is slightly decreased prior exam. Moderate pulmonary vascular congestion remains. Left lower lobe airspace disease noted. Overall aeration of the left lung improved. Mild atelectasis is present at the right base. IMPRESSION: 1. Improving aeration of the left lung with persistent left lower lobe airspace disease and effusion. 2. Stable moderate pulmonary vascular congestion. 3. The support apparatus is stable. Electronically Signed   By: San Morelle M.D.   On: 04/13/2021 07:48    DG Chest Port 1 View  Result Date: 04/11/2021 CLINICAL DATA:  Intubated, previous abnormal chest x-ray EXAM: PORTABLE CHEST 1 VIEW COMPARISON:  04/09/2021 FINDINGS: Single frontal view of the chest demonstrates endotracheal tube overlying tracheal air column tip just below thoracic inlet. Stable right internal jugular dialysis catheter. External defibrillator pads overlie the chest. Cardiac silhouette remains enlarged. There is progressive left basilar consolidation and effusion since prior study. Mild central vascular congestion. No pneumothorax identified on this supine exam. No acute bony abnormalities. IMPRESSION: 1. No complication after intubation. 2. Progressive left basilar consolidation and left effusion. Electronically Signed   By: Randa Ngo M.D.   On: 04/11/2021 18:35   ECHOCARDIOGRAM COMPLETE  Result Date: 04/12/2021    ECHOCARDIOGRAM REPORT   Patient Name:   Larry Morrison Date of Exam: 04/12/2021 Medical Rec #:  [id number]    Height:       62.0 in Accession #:    [id number]   Weight:       108.2 lb Date of Birth:  08/24/1970     BSA:          1.473 m Patient Age:    78 years     BP:           100/68 mmHg Patient Gender: M            HR:           59 bpm. Exam Location:  [facility] Procedure: 2D Echo, Cardiac Doppler and Color Doppler Indications:     Cardiac Arrest I46.9  History:         Patient has prior history of Echocardiogram examinations, most                  recent 04/01/2021. Stroke; Risk Factors:Hypertension.  Sonographer:     Sherrie Sport Referring Phys:  [id number] Bradly Bienenstock Diagnosing Phys: Nelva Bush MD IMPRESSIONS  1. Left ventricular ejection fraction, by estimation, is <20%. The left ventricle has  severely decreased function. The left ventricle demonstrates global hypokinesis. Left ventricular diastolic parameters are consistent with Grade II diastolic dysfunction (pseudonormalization). Elevated left atrial pressure.  2. There is at least mild pulmonary hypertension  (RVSP 35-40 mmHg plus central venous pressure). Right ventricular systolic function is moderately reduced. The right ventricular size is normal.  3. Left atrial size was severely dilated.  4. The mitral valve is abnormal. Mild to moderate mitral valve regurgitation. No evidence of mitral stenosis.  5. Tricuspid valve regurgitation is moderate to severe.  6. The aortic valve is tricuspid. Aortic valve regurgitation is not visualized. No aortic stenosis is present. Comparison(s): A prior study was performed on 03/01/2021. No significant change from prior study. FINDINGS  Left Ventricle: Left ventricular ejection fraction, by estimation, is <20%. The left ventricle has severely decreased function. The left ventricle demonstrates global hypokinesis. The left ventricular internal cavity size was normal in size. There is borderline left ventricular hypertrophy. Left ventricular diastolic parameters are consistent with Grade II diastolic dysfunction (pseudonormalization). Elevated left atrial pressure. Right Ventricle: There is at least mild pulmonary hypertension (RVSP 35-40 mmHg plus central venous pressure). The right ventricular size is normal. No increase in right ventricular wall thickness. Right ventricular systolic function is moderately reduced. Left Atrium: Left atrial size was severely dilated. Right Atrium: Right atrial size was normal in size. Pericardium: Trivial pericardial effusion is present. Mitral Valve: The mitral valve is abnormal. There is mild thickening of the mitral valve leaflet(s). Mild to moderate mitral valve regurgitation. No evidence of mitral valve stenosis. MV peak gradient, 3.1 mmHg. The mean mitral valve gradient is 2.0 mmHg. Tricuspid Valve: The tricuspid valve is grossly normal. Tricuspid valve regurgitation is moderate to severe. Aortic Valve: The aortic valve is tricuspid. Aortic valve regurgitation is not visualized. No aortic stenosis is present. Aortic valve mean gradient measures  2.5 mmHg. Aortic valve peak gradient measures 4.2 mmHg. Aortic valve area, by VTI measures 1.98 cm. Pulmonic Valve: The pulmonic valve was not well visualized. Pulmonic valve regurgitation is trivial. No evidence of pulmonic stenosis. Aorta: The aortic root is normal in size and structure. Pulmonary Artery: The pulmonary artery is not well seen. Venous: IVC assessment for right atrial pressure unable to be performed due to mechanical ventilation. IAS/Shunts: No atrial level shunt detected by color flow Doppler. Additional Comments: There is pleural effusion in the left lateral region.  LEFT VENTRICLE PLAX 2D LVIDd:         5.10 cm      Diastology LVIDs:         4.60 cm      LV e' medial:    4.79 cm/s LV PW:         1.10 cm      LV E/e' medial:  17.5 LV IVS:        0.90 cm      LV e' lateral:   6.20 cm/s LVOT diam:     2.00 cm      LV E/e' lateral: 13.5 LV SV:         33 LV SV Index:   22 LVOT Area:     3.14 cm  LV Volumes (MOD) LV vol d, MOD A2C: 141.0 ml LV vol d, MOD A4C: 141.0 ml LV vol s, MOD A2C: 101.0 ml LV vol s, MOD A4C: 113.0 ml LV SV MOD A2C:     40.0 ml LV SV MOD A4C:     141.0 ml LV SV MOD BP:  35.3 ml RIGHT VENTRICLE RV Basal diam:  3.50 cm RV S prime:     9.03 cm/s TAPSE (M-mode): 1.6 cm LEFT ATRIUM              Index        RIGHT ATRIUM           Index LA diam:        4.10 cm  2.78 cm/m   RA Area:     17.29 cm LA Vol (A2C):   121.0 ml 82.16 ml/m  RA Volume:   52.01 ml  35.32 ml/m LA Vol (A4C):   127.0 ml 86.24 ml/m LA Biplane Vol: 126.0 ml 85.56 ml/m  AORTIC VALVE                    PULMONIC VALVE AV Area (Vmax):    1.92 cm     PV Vmax:        0.47 m/s AV Area (Vmean):   1.87 cm     PV Vmean:       31.400 cm/s AV Area (VTI):     1.98 cm     PV VTI:         0.088 m AV Vmax:           102.50 cm/s  PV Peak grad:   0.9 mmHg AV Vmean:          67.600 cm/s  PV Mean grad:   0.0 mmHg AV VTI:            0.165 m      RVOT Peak grad: 2 mmHg AV Peak Grad:      4.2 mmHg AV Mean Grad:      2.5 mmHg  LVOT Vmax:         62.70 cm/s LVOT Vmean:        40.200 cm/s LVOT VTI:          0.104 m LVOT/AV VTI ratio: 0.63  AORTA Ao Root diam: 2.90 cm MITRAL VALVE               TRICUSPID VALVE MV Area (PHT): 4.26 cm    TR Peak grad:   37.7 mmHg MV Area VTI:   1.38 cm    TR Vmax:        307.00 cm/s MV Peak grad:  3.1 mmHg MV Mean grad:  2.0 mmHg    SHUNTS MV Vmax:       0.88 m/s    Systemic VTI:  0.10 m MV Vmean:      58.8 cm/s   Systemic Diam: 2.00 cm MV Decel Time: 178 msec    Pulmonic VTI:  0.114 m MV E velocity: 84.00 cm/s MV A velocity: 68.60 cm/s MV E/A ratio:  1.22 Harrell Gave End MD Electronically signed by Nelva Bush MD Signature Date/Time: 04/12/2021/2:01:34 PM    Final      Medications:    dexmedetomidine (PRECEDEX) IV infusion Stopped (04/12/21 2105)   doxycycline (VIBRAMYCIN) IV 100 mg (04/12/21 2336)   norepinephrine (LEVOPHED) Adult infusion Stopped (04/13/21 0530)   propofol (DIPRIVAN) infusion Stopped (04/12/21 0735)    amLODipine  10 mg Oral Daily   carvedilol  3.125 mg Oral BID WC   chlorhexidine gluconate (MEDLINE KIT)  15 mL Mouth Rinse BID   Chlorhexidine Gluconate Cloth  6 each Topical Q0600   docusate  100 mg Per Tube BID   heparin  5,000 Units Subcutaneous Q8H   mouth rinse  15  mL Mouth Rinse 10 times per day   multivitamin  1 tablet Oral QHS   pantoprazole (PROTONIX) IV  40 mg Intravenous Q24H   patiromer  16.8 g Oral Daily   polyethylene glycol  17 g Per Tube Daily   sevelamer carbonate  800 mg Oral TID WC   sodium bicarbonate  50 mEq Intravenous Once   fentaNYL (SUBLIMAZE) injection, fentaNYL (SUBLIMAZE) injection, midazolam, midazolam, ondansetron **OR** ondansetron (ZOFRAN) IV  Assessment/ Plan:  Larry Morrison is a 50 y.o. 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   -Cardiac arrest/CPR during HD on November 21.  Received about 2 L of fluid during resuscitation.  -Trial of IHD today to see how he tolerates it. tPA placed in cathter   Volume overload Blood pressure low normal at present. Fluid removal will be limited  Anemia with chronic kidney disease: hemoglobin    Lab Results  Component Value Date   HGB 8.7 (L) 04/13/2021     Secondary Hyperparathyroidism: with hyperphosphatemia   Lab Results  Component Value Date   CALCIUM 8.0 (L) 04/13/2021   CAION 0.76 (LL) 08/07/2019   PHOS 7.1 (H) 04/13/2021    - Continue sevelamer with meals when able to eat.  5. Acute resp failure Intubated post code Fio2 24% Plan for extubation after HD today    LOS: 3 Shaleena Crusoe 11/23/20228:44 AM

## 2021-04-14 LAB — CBC
HCT: 27.8 % — ABNORMAL LOW (ref 39.0–52.0)
Hemoglobin: 8.8 g/dL — ABNORMAL LOW (ref 13.0–17.0)
MCH: 30.8 pg (ref 26.0–34.0)
MCHC: 31.7 g/dL (ref 30.0–36.0)
MCV: 97.2 fL (ref 80.0–100.0)
Platelets: 161 10*3/uL (ref 150–400)
RBC: 2.86 MIL/uL — ABNORMAL LOW (ref 4.22–5.81)
RDW: 16.9 % — ABNORMAL HIGH (ref 11.5–15.5)
WBC: 5.6 10*3/uL (ref 4.0–10.5)
nRBC: 0 % (ref 0.0–0.2)

## 2021-04-14 LAB — RENAL FUNCTION PANEL
Albumin: 2 g/dL — ABNORMAL LOW (ref 3.5–5.0)
Anion gap: 8 (ref 5–15)
BUN: 30 mg/dL — ABNORMAL HIGH (ref 6–20)
CO2: 27 mmol/L (ref 22–32)
Calcium: 7.7 mg/dL — ABNORMAL LOW (ref 8.9–10.3)
Chloride: 98 mmol/L (ref 98–111)
Creatinine, Ser: 4.85 mg/dL — ABNORMAL HIGH (ref 0.61–1.24)
GFR, Estimated: 14 mL/min — ABNORMAL LOW (ref >60)
Glucose, Bld: 85 mg/dL (ref 70–99)
Phosphorus: 3.9 mg/dL (ref 2.5–4.6)
Potassium: 3.8 mmol/L (ref 3.5–5.1)
Sodium: 133 mmol/L — ABNORMAL LOW (ref 135–145)

## 2021-04-14 LAB — MAGNESIUM: Magnesium: 1.7 mg/dL (ref 1.7–2.4)

## 2021-04-14 LAB — CULTURE, RESPIRATORY W GRAM STAIN: Culture: NORMAL

## 2021-04-14 LAB — GLUCOSE, CAPILLARY
Glucose-Capillary: 77 mg/dL (ref 70–99)
Glucose-Capillary: 130 mg/dL — ABNORMAL HIGH (ref 70–99)
Glucose-Capillary: 80 mg/dL (ref 70–99)
Glucose-Capillary: 102 mg/dL — ABNORMAL HIGH (ref 70–99)
Glucose-Capillary: 82 mg/dL (ref 70–99)

## 2021-04-14 MED ORDER — FENTANYL BOLUS VIA INFUSION
50.0000 ug | INTRAVENOUS | Status: DC | PRN
Start: 2021-04-14 — End: 2021-04-15
  Administered 2021-04-14: 50 ug via INTRAVENOUS
  Administered 2021-04-15: 75 ug via INTRAVENOUS
  Administered 2021-04-15: 100 ug via INTRAVENOUS
  Filled 2021-04-14: qty 100

## 2021-04-14 MED ORDER — FENTANYL 2500MCG IN NS 250ML (10MCG/ML) PREMIX INFUSION
50.0000 ug/h | INTRAVENOUS | Status: DC
  Administered 2021-04-14: 50 ug/h via INTRAVENOUS
  Administered 2021-04-15: 125 ug/h via INTRAVENOUS
  Administered 2021-04-16: 175 ug/h via INTRAVENOUS
  Filled 2021-04-14 (×3): qty 250

## 2021-04-14 MED ORDER — EPOETIN ALFA 10000 UNIT/ML IJ SOLN
4000.0000 [IU] | INTRAMUSCULAR | Status: DC
  Administered 2021-04-20: 4000 [IU] via INTRAVENOUS

## 2021-04-14 NOTE — Progress Notes (Signed)
Spoke with Dr. Lanney Gins and made MD aware that patient is requiring frequent IV pushes of PRN fentanyl and versed to maintain RASS and CPOT goals. MD gave order for fentanyl drip and PRN fentanyl per [facility] protocol.

## 2021-04-14 NOTE — Progress Notes (Signed)
NAME:  Larry Morrison, MRN:  [id number], DOB:  Sep 24, 1970, LOS: 4 ADMISSION DATE:  04/09/2021, CONSULTATION DATE:  04/11/21 REFERRING MD:  Berle Mull, MD  CHIEF COMPLAINT:  Cardiac Arrest   HPI  50 y.o with significant PMH of ESRD on HD who presented to the ED after an accidental fall at home due to falling asleep in his wheelchair. Came up to MICU post PEA arrest during HD intubated.     04/14/21- patient failed SBT with tachypnea and high volume inspissated respiratory secretions.  He is improving on antibiotics and dialysis.    Past Medical History   Cardiomyopathy    Protein-calorie malnutrition, severe 03/01/2021  Arteriovenous fistula occlusion (HCC)    Pleural effusion 02/28/2021  Arteriovenous fistula thrombosis (HCC) 07/86/7544  Metabolic acidosis 92/05/69  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 02/28/2021  History of traumatic brain injury 09/07/2020  Cognitive impairment 21/97/5883  Metabolic encephalopathy 25/49/8264  ESRD needing dialysis ([place]) 09/04/2020  Hypoglycemia 09/04/2020  Uremia 09/04/2020  COVID-19 virus infection 06/18/2020  Stroke ([place]) 06/18/2020  Thrombocytopenia ([place]) 06/18/2020  Elevated troponin 06/18/2020  Hyperkalemia 06/13/2020  Anemia in ESRD (end-stage renal disease) ([place]) 09/17/2019  Essential hypertension 07/15/2019  Testicular torsion    Pyuria 06/10/2016  Chronic pain following surgery or procedure 12/01/2015  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 11/20: Hyperkalemia with potassium of 6.2  getting [place].  Patient refusing dialysis today. 11/21: Patient went into PEA Cardiac arrest during HD session, intubated post ROSC and transferred to ICU. PCCM consulted 04/13/21- patient for SBT today to liberate from MV. Plan for HD today as well.    Consults:  Nephrology PCCM  Procedures:  11/21: intubation 11/21: Right Femoral vein central line  Significant Diagnostic Tests:  11/21: Chest Xray>Progressive left basilar consolidation and left effusion. 11/21: Abdominal xray>Enteric catheter tip and side port projecting over the gastric body. Bowel gas pattern is unremarkable. Persistent left basilar consolidation and effusion. 11/21: Noncontrast CT head> 11/19: Chest Xray>Cardiomegaly. Increased homogeneous opacity is seen in the left mid and left lower lung fields suggesting increase in left pleural effusion and possibly underlying atelectasis/pneumonia. Central pulmonary vessels are slightly less prominent. There are no signs of alveolar pulmonary edema in the right lung.    Micro Data:  11/19: SARS-CoV-2 PCR> negative 11/19: Influenza PCR> negative 11/21: Blood culture x2> 11/21: Urine Culture> 11/21: Tracheal aspirate> 11/21: MRSA PCR>>  11/21: Strep pneumo urinary antigen> 11/21: Legionella urinary antigen>  Antimicrobials:  Vancomycin 11/21> Cefepime 11/21>  OBJECTIVE  Blood pressure 103/74, pulse 62, temperature 98.8 F (37.1 C), temperature source Oral, resp. rate 18, height 5' 2.01" (1.575 m), weight 49.1 kg, SpO2 99 %.    Vent Mode: PRVC FiO2 (%):  [24 %] 24 % Set Rate:  [18 bmp] 18 bmp Vt Set:  [450 mL] 450 mL PEEP:  [5 cmH20] 5 cmH20   Intake/Output Summary (Last 24 hours) at 04/14/2021 1238 Last data filed at 04/14/2021 1200 Gross per 24 hour  Intake 1002.33 ml  Output -94 ml  Net 1096.33 ml    Filed Weights   04/11/21 1525 04/12/21 1645 04/13/21 1247  Weight:  49.1 kg 49.1 kg 49.1 kg   Physical Examination  GENERAL: 50 year-old critically  ill patient lying in the bed intubated, mechanically ventilated and sedated EYES: Pupils equal, round, reactive to light and accommodation. No scleral icterus. Extraocular muscles intact.  HEENT: Head atraumatic, normocephalic. Oropharynx and nasopharynx clear.  NECK:  Supple, no jugular venous distention. No thyroid enlargement, no tenderness.  LUNGS: Decreased breath sounds bilaterally, no wheezing, rales, moderate rhonchi. No crepitation. No use of accessory muscles of respiration.  CARDIOVASCULAR: S1, S2 normal. No murmurs, rubs, or gallops.  ABDOMEN: Soft, nontender, nondistended. Bowel sounds present. No organomegaly or mass.  Nephrostomy tube in place draining yellow urine EXTREMITIES: No pedal edema, cyanosis, or clubbing.  NEUROLOGIC: Cranial nerves II through XII are intact.  Muscle strength NOT checked. Sensation intact. Gait not checked.  PSYCHIATRIC: The patient is intubated and sedated SKIN: No obvious rash, lesion, or ulcer.   Labs/imaging that I havepersonally reviewed  (right click and "Reselect all SmartList Selections" daily)     Labs   CBC: Recent Labs  Lab 04/11/21 1449 04/11/21 1930 04/12/21 0540 04/13/21 0450 04/14/21 0515  WBC 5.9 9.1 6.5 5.2 5.6  NEUTROABS 4.2  --   --   --   --   HGB 10.0* 9.0* 9.0* 8.7* 8.8*  HCT 30.8* 29.2* 28.6* 27.2* 27.8*  MCV 96.3 98.3 97.3 98.2 97.2  PLT 237 189 187 174 161     Basic Metabolic Panel: Recent Labs  Lab 04/09/21 1653 04/10/21 0746 04/11/21 1449 04/11/21 1930 04/12/21 0540 04/13/21 0450 04/14/21 0515  NA 141   < > 140 141 139 137 133*  K 4.9   < > 6.1* 4.7 5.3* 4.6 3.8  CL 104   < > 105 106 104 104 98  CO2 22   < > 19* 21* _0 GLUCOSE 86   < > 122* 92 89 74 85  BUN 79*   < > 100* 71* 73* 72* 30*  CREATININE 8.68*   < > 10.31* 7.45* 7.96* 7.71* 4.85*  CALCIUM 8.6*   < > 8.4* 9.0 8.6* 8.0* 7.7*  MG 2.3  --   --   --  2.5*  --  1.7  PHOS 7.8*  --  9.6*  --  7.1* 7.1* 3.9   < > = values in this  interval not displayed.    GFR: Estimated Creatinine Clearance: 12.4 mL/min (A) (by C-G formula based on SCr of 4.85 mg/dL (H)). Recent Labs  Lab 04/09/21 1844 04/10/21 0746 04/11/21 0347 04/11/21 1449 04/11/21 1930 04/11/21 2205 04/12/21 0540 04/13/21 0450 04/14/21 0515  PROCALCITON 2.88 3.03 3.54  --   --   --   --   --   --   WBC  --  7.4 6.4   < > 9.1  --  6.5 5.2 5.6  LATICACIDVEN  --   --   --   --   --  1.5  --   --   --    < > = values in this interval not displayed.     Liver Function Tests: Recent Labs  Lab 04/11/21 1449 04/11/21 1930 04/13/21 0450 04/14/21 0515  AST  --  26  --   --   ALT  --  15  --   --   ALKPHOS  --  105  --   --   BILITOT  --  1.3*  --   --   PROT  --  7.3  --   --  ALBUMIN 2.5* 2.3* 2.1* 2.0*    No results for input(s): LIPASE, AMYLASE in the last 168 hours. No results for input(s): AMMONIA in the last 168 hours.  ABG    Component Value Date/Time   PHART 7.40 04/13/2021 0530   PCO2ART 34 04/13/2021 0530   PO2ART 131 (H) 04/13/2021 0530   HCO3 21.1 04/13/2021 0530   TCO2 27 08/07/2019 1018   ACIDBASEDEF 3.2 (H) 04/13/2021 0530   O2SAT 99.0 04/13/2021 0530      Coagulation Profile: Recent Labs  Lab 04/11/21 1930  INR 1.5*     Cardiac Enzymes: No results for input(s): CKTOTAL, CKMB, CKMBINDEX, TROPONINI in the last 168 hours.  HbA1C: Hgb A1c MFr Bld  Date/Time Value Ref Range Status  09/05/2020 05:00 AM 5.0 4.8 - 5.6 % Final    Comment:    (NOTE)         Prediabetes: 5.7 - 6.4         Diabetes: >6.4         Glycemic control for adults with diabetes: <7.0     CBG: Recent Labs  Lab 04/13/21 1948 04/13/21 2351 04/14/21 0345 04/14/21 0742 04/14/21 1111  GLUCAP 88 103* 77 130* 80     Review of Systems:   UNABLE TO ASSESS PATIENT IS INTUBATED AND SEDATED  Past Medical History  He,  has a past medical history of Anemia, Aneurysm ([place]), Dialysis patient ([place]), Dyspnea, Foot drop, History of kidney  stones, History of nephrostomy, Hypertension, Renal disorder, and Stroke ([place]).   Surgical History    Past Surgical History:  Procedure Laterality Date   A/V SHUNT INTERVENTION N/A 03/01/2021   Procedure: A/V SHUNT INTERVENTION;  Surgeon: Katha Cabal, MD;  Location: [facility];  Service: Cardiovascular;  Laterality: N/A;   AV FISTULA PLACEMENT Right 08/07/2019   Procedure: ARTERIOVENOUS (AV) FISTULA CREATION;  Surgeon: Algernon Huxley, MD;  Location: [facility];  Service: Vascular;  Laterality: Right;   AV FISTULA PLACEMENT Right 11/20/2019   Procedure: INSERTION OF ARTERIOVENOUS (AV) GORE-TEX GRAFT ARM;  Surgeon: Algernon Huxley, MD;  Location: [facility];  Service: Vascular;  Laterality: Right;   IR NEPHROSTOMY EXCHANGE RIGHT  09/10/2020   NEPHRECTOMY Left    NEPHRECTOMY     ORCHIECTOMY Right 10/27/2016   Procedure: PSB ORCHIECTOMY;  Surgeon: Hollice Espy, MD;  Location: [facility];  Service: Urology;  Laterality: Right;   ORCHIOPEXY Bilateral 10/27/2016   Procedure: ORCHIOPEXY ADULT;  Surgeon: Hollice Espy, MD;  Location: [facility];  Service: Urology;  Laterality: Bilateral;   SCROTAL EXPLORATION Bilateral 10/27/2016   Procedure: SCROTUM EXPLORATION;  Surgeon: Hollice Espy, MD;  Location: [facility];  Service: Urology;  Laterality: Bilateral;     Social History   reports that he has quit smoking. His smoking use included cigarettes. He smoked an average of .5 packs per day. He has never used smokeless tobacco. He reports current drug use. Drug: Marijuana. He reports that he does not drink alcohol.   Family History   His Family history is unknown by patient.   Allergies Allergies  Allergen Reactions   Vancomycin     Patient denies - repeated denial to pharm tech 09-05-2020     Home Medications  Prior to Admission medications   Medication Sig Start Date End Date Taking? Authorizing Provider  aspirin EC 81 MG EC tablet Take 1 tablet (81 mg total) by mouth daily. Swallow whole.  03/04/21   Enzo Bi, MD  carvedilol (COREG) 3.125  MG tablet Take 1 tablet (3.125 mg total) by mouth 2 (two) times daily with a meal. Patient not taking: Reported on 04/09/2021 03/03/21 06/01/21  Enzo Bi, MD  feeding supplement (ENSURE ENLIVE / ENSURE PLUS) LIQD Take 237 mLs by mouth daily. Patient not taking: Reported on 04/09/2021 03/03/21   Enzo Bi, MD  losartan (COZAAR) 25 MG tablet Take 1 tablet (25 mg total) by mouth daily. Patient not taking: Reported on 04/09/2021 03/04/21 06/02/21  Enzo Bi, MD  multivitamin (RENA-VIT) TABS tablet Take 1 tablet by mouth at bedtime. Patient not taking: Reported on 04/09/2021 03/03/21   Enzo Bi, MD  sevelamer carbonate (RENVELA) 800 MG tablet Take 1 tablet (800 mg total) by mouth 3 (three) times daily with meals. Patient not taking: Reported on 04/09/2021 03/03/21 06/01/21  Enzo Bi, MD    Scheduled Meds:  amLODipine  10 mg Oral Daily   carvedilol  3.125 mg Oral BID WC   chlorhexidine gluconate (MEDLINE KIT)  15 mL Mouth Rinse BID   Chlorhexidine Gluconate Cloth  6 each Topical Q0600   docusate  100 mg Per Tube BID   [START ON 04/15/2021] epoetin (EPOGEN/PROCRIT) injection  4,000 Units Intravenous Q M,W,F-HD   feeding supplement (VITAL AF 1.2 CAL)  1,000 mL Per Tube Q24H   heparin  5,000 Units Subcutaneous Q8H   mouth rinse  15 mL Mouth Rinse 10 times per day   multivitamin  1 tablet Oral QHS   pantoprazole (PROTONIX) IV  40 mg Intravenous Q24H   patiromer  16.8 g Oral Daily   polyethylene glycol  17 g Per Tube Daily   sodium bicarbonate  50 mEq Intravenous Once   Continuous Infusions:  dexmedetomidine (PRECEDEX) IV infusion Stopped (04/12/21 2105)   doxycycline (VIBRAMYCIN) IV 100 mg (04/14/21 1216)   norepinephrine (LEVOPHED) Adult infusion Stopped (04/13/21 0530)   piperacillin-tazobactam (ZOSYN)  IV 2.25 g (04/14/21 0513)   propofol (DIPRIVAN) infusion Stopped (04/12/21 0735)   PRN Meds:.fentaNYL (SUBLIMAZE) injection, midazolam,  midazolam, ondansetron **OR** ondansetron (ZOFRAN) IV   Active Hospital Problem list   Acute hypoxic respiratory failure Sepsis   Assessment & Plan:  Acute Hypoxic Respiratory Failure in the setting of Pulmonary Edema with Pleural Effusion, Combined severe CHF and probable Pneumonia PMHx: ESRD noncompliance with hemodialysis treatment. -Continue ventilator support & lung protective strategies -Wean PEEP & FiO2 as tolerated, maintain SpO2 > 90% -Head of bed elevated 30 degrees, VAP protocol in place -Plateau pressures less than 30 cm H20  -Intermittent chest x-ray & ABG PRN -Daily WUA with SBT as tolerated  -Ensure adequate pulmonary hygiene  -HD for fluid removal -Budesonide inhaler nebs BID, bronchodilators PRN -PAD protocol in place: continue propofol gtt  PEA Cardiac arrest Suspect in the setting of respiratory arrest from severe metabolic disorder QTc prolongation Elevated Troponin likely demand without dynamic EKG changes Received 1 round of defibrillations UDS + Cocaine -Not meeting criteria for TTM -Echocardiogram ordered -Trend troponin, lactic -Serial EKG  Sepsis with due to suspected Pneumonia  TRACHEAL ASPIRATE +GPC Lactic Acidosis Lactic: 1.5, Baseline PCT: 2.88>3.03>3.54, UA: pending, CXR: Progressive left basilar consolidation and left effusion. -Follow blood and Urine cultures, trend lactic/ PCT -Check strep pneumo antigen and Legionella -monitor WBC/ fever curve -Start broad coverage with Cefepime  & vancomycin  -Currently not requiring vasopressors. 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 BNP >4500 Hypertension Urgency PMHx: Cardiomyopathy -Continuous  cardiac monitoring -Maintain MAP greater than 65 -HD for fluid removal -Continue Coreg as bp permits. -Hold Losartan for now   ESRD on HD MWF Severe Metabolic Acidosis -Monitor I&O's /  urinary output -Follow BMP -Continue Sodium bicarb gtt for now -Ensure adequate renal perfusion -Avoid nephrotoxic agents as able -Replace electrolytes as indicated -HD MWF -Nephrology following, input appreciated   Polysubstance Abuse UDS + Cocaine, Tobacco abuse -Nicotine patch -cessation counseling once able to participate   Best practice:  Diet:  Tube Feed  Pain/Anxiety/Delirium protocol (if indicated): Yes (RASS goal 0) VAP protocol (if indicated): Yes DVT prophylaxis: Subcutaneous Heparin GI prophylaxis: N/A Glucose control:  SSI No Central venous access:  Yes, and it is still needed Arterial line:  N/A Foley:  N/A Mobility:  bed rest  PT consulted: N/A Last date of multidisciplinary goals of care discussion [11/21] Code Status:  full code Disposition: ICU   = Goals of Care = Code Status Order: _0 @   Primary Emergency Contact: [place], Home Phone: [phone] Wishes to pursue full aggressive treatment and intervention options, including CPR and intubation, but goals of care will be addressed on going with family if that should become necessary.  Critical care provider statement:   Total critical care time: 33 minutes   Performed by: Lanney Gins MD   Critical care time was exclusive of separately billable procedures and treating other patients.   Critical care was necessary to treat or prevent imminent or life-threatening deterioration.   Critical care was time spent personally by me on the following activities: development of treatment plan with patient and/or surrogate as well as nursing, discussions with consultants, evaluation of patient's response to treatment, examination of patient, obtaining history from patient or surrogate, ordering and performing treatments and interventions, ordering and review of laboratory studies, ordering and review of radiographic studies, pulse oximetry and re-evaluation of patient's condition.    Ottie Glazier, M.D.   Pulmonary & Critical Care Medicine

## 2021-04-14 NOTE — Progress Notes (Signed)
Spoke with Dr. Lanney Gins and made MD aware that patient's heart rate is in 70's and BP 102/75, coreg and norvasc both due. MD gave order to hold both meds this morning.

## 2021-04-14 NOTE — Progress Notes (Signed)
[facility]  ROUNDING NOTE   Subjective:   Patient had cardiac arrest during dialysis on monday.  He was intubated and placed on pressors and moved to the ICU. This morning,  he remains intubated. No pressors Underwent hemodialysis successfully on Wednesday Blood flow rate of 400 cc/min was achieved  Objective:  Vital signs in last 24 hours:  Temp:  [97.3 F (36.3 C)-98.8 F (37.1 C)] 98.8 F (37.1 C) (11/24 1200) Pulse Rate:  [58-77] 62 (11/24 1200) Resp:  [9-24] 18 (11/24 1200) BP: (91-135)/(65-89) 103/74 (11/24 1200) SpO2:  [96 %-100 %] 99 % (11/24 1200) FiO2 (%):  [24 %] 24 % (11/24 0805) Weight:  [49.1 kg] 49.1 kg (11/23 1247)  Weight change: 0 kg Filed Weights   04/11/21 1525 04/12/21 1645 04/13/21 1247  Weight: 49.1 kg 49.1 kg 49.1 kg    Intake/Output: I/O last 3 completed shifts: In: 988.5 [I.V.:79.4; NG/GT:309; IV Piggyback:600] Out: -14 [Urine:485]   Intake/Output this shift:  Total I/O In: 430.1 [NG/GT:180; IV Piggyback:250.1] Out: -   Physical Exam: General: Critically ill appearing  Head: ET tube in place  Lungs:  Ventilator assisted, FiO2 24%, decreased breath sounds at bases  Heart: Regular   Abdomen:  Soft, nontender,  Extremities:  no peripheral edema.  Neurologic: Opens eyes, not following commands  Skin: Warm   Access: Right IJ permcath  Right nephrostomy  Basic Metabolic Panel: Recent Labs  Lab 04/09/21 1653 04/10/21 0746 04/11/21 1449 04/11/21 1930 04/12/21 0540 04/13/21 0450 04/14/21 0515  NA 141   < > 140 141 139 137 133*  K 4.9   < > 6.1* 4.7 5.3* 4.6 3.8  CL 104   < > 105 106 104 104 98  CO2 22   < > 19* 21* '23 22 27  ' GLUCOSE 86   < > 122* 92 89 74 85  BUN 79*   < > 100* 71* 73* 72* 30*  CREATININE 8.68*   < > 10.31* 7.45* 7.96* 7.71* 4.85*  CALCIUM 8.6*   < > 8.4* 9.0 8.6* 8.0* 7.7*  MG 2.3  --   --   --  2.5*  --  1.7  PHOS 7.8*  --  9.6*  --  7.1* 7.1* 3.9   < > = values in this interval not displayed.      Liver Function Tests: Recent Labs  Lab 04/11/21 1449 04/11/21 1930 04/13/21 0450 04/14/21 0515  AST  --  26  --   --   ALT  --  15  --   --   ALKPHOS  --  105  --   --   BILITOT  --  1.3*  --   --   PROT  --  7.3  --   --   ALBUMIN 2.5* 2.3* 2.1* 2.0*    No results for input(s): LIPASE, AMYLASE in the last 168 hours. No results for input(s): AMMONIA in the last 168 hours.  CBC: Recent Labs  Lab 04/11/21 1449 04/11/21 1930 04/12/21 0540 04/13/21 0450 04/14/21 0515  WBC 5.9 9.1 6.5 5.2 5.6  NEUTROABS 4.2  --   --   --   --   HGB 10.0* 9.0* 9.0* 8.7* 8.8*  HCT 30.8* 29.2* 28.6* 27.2* 27.8*  MCV 96.3 98.3 97.3 98.2 97.2  PLT 237 189 187 174 161     Cardiac Enzymes: No results for input(s): CKTOTAL, CKMB, CKMBINDEX, TROPONINI in the last 168 hours.  BNP: Invalid input(s): POCBNP  CBG: Recent  Labs  Lab 04/13/21 1948 04/13/21 2351 04/14/21 0345 04/14/21 0742 04/14/21 1111  GLUCAP 88 103* 77 130* 80     Microbiology: Results for orders placed or performed during the hospital encounter of 04/09/21  Resp Panel by RT-PCR (Flu A&B, Covid) Nasopharyngeal Swab     Status: None   Collection Time: 04/09/21  7:29 PM   Specimen: Nasopharyngeal Swab; Nasopharyngeal(NP) swabs in vial transport medium  Result Value Ref Range Status   SARS Coronavirus 2 by RT PCR NEGATIVE NEGATIVE Final    Comment: (NOTE) SARS-CoV-2 target nucleic acids are NOT DETECTED.  The SARS-CoV-2 RNA is generally detectable in upper respiratory specimens during the acute phase of infection. The lowest concentration of SARS-CoV-2 viral copies this assay can detect is 138 copies/mL. A negative result does not preclude SARS-Cov-2 infection and should not be used as the sole basis for treatment or other patient management decisions. A negative result may occur with  improper specimen collection/handling, submission of specimen other than nasopharyngeal swab, presence of viral mutation(s)  within the areas targeted by this assay, and inadequate number of viral copies(<138 copies/mL). A negative result must be combined with clinical observations, patient history, and epidemiological information. The expected result is Negative.  Fact Sheet for Patients:  EntrepreneurPulse.com.au  Fact Sheet for Healthcare Providers:  IncredibleEmployment.be  This test is no t yet approved or cleared by the [place] FDA and  has been authorized for detection and/or diagnosis of SARS-CoV-2 by FDA under an Emergency Use Authorization (EUA). This EUA will remain  in effect (meaning this test can be used) for the duration of the COVID-19 declaration under Section 564(b)(1) of the Act, 21 U.S.C.section 360bbb-3(b)(1), unless the authorization is terminated  or revoked sooner.       Influenza A by PCR NEGATIVE NEGATIVE Final   Influenza B by PCR NEGATIVE NEGATIVE Final    Comment: (NOTE) The Xpert Xpress SARS-CoV-2/FLU/RSV plus assay is intended as an aid in the diagnosis of influenza from Nasopharyngeal swab specimens and should not be used as a sole basis for treatment. Nasal washings and aspirates are unacceptable for Xpert Xpress SARS-CoV-2/FLU/RSV testing.  Fact Sheet for Patients: EntrepreneurPulse.com.au  Fact Sheet for Healthcare Providers: IncredibleEmployment.be  This test is not yet approved or cleared by the [place] FDA and has been authorized for detection and/or diagnosis of SARS-CoV-2 by FDA under an Emergency Use Authorization (EUA). This EUA will remain in effect (meaning this test can be used) for the duration of the COVID-19 declaration under Section 564(b)(1) of the Act, 21 U.S.C. section 360bbb-3(b)(1), unless the authorization is terminated or revoked.  Performed at [facility], [address] [place], [place] [address]   MRSA Next Gen by PCR, Nasal     Status:  Abnormal   Collection Time: 04/11/21 10:23 PM   Specimen: Nasal Mucosa; Nasal Swab  Result Value Ref Range Status   MRSA by PCR Next Gen DETECTED (A) NOT DETECTED Final    Comment: RESULT CALLED TO, READ BACK BY AND VERIFIED WITH: TESSYAMMA THOMAS '@[id number]'  04/12/21 RH (NOTE) The GeneXpert MRSA Assay (FDA approved for NASAL specimens only), is one component of a comprehensive MRSA colonization surveillance program. It is not intended to diagnose MRSA infection nor to guide or monitor treatment for MRSA infections. Test performance is not FDA approved in patients less than 57 years old. Performed at [facility] [address], [place], [place] [address]   Urine Culture     Status: Abnormal (Preliminary  result)   Collection Time: 04/11/21 10:30 PM   Specimen: In/Out Cath Urine  Result Value Ref Range Status   Specimen Description   Final    IN/OUT CATH URINE Performed at [facility], [address], [place], [place] [address]    Special Requests   Final    NONE Performed at [facility], [address] [place], [place] [address]    Culture (A)  Final    >=100,000 COLONIES/mL STAPHYLOCOCCUS AUREUS CULTURE REINCUBATED FOR BETTER GROWTH Performed at [facility], [place] [address] [place], [place] [address]    Report Status PENDING  Incomplete  CULTURE, BLOOD (ROUTINE X 2) w Reflex to ID Panel     Status: None (Preliminary result)   Collection Time: 04/11/21 10:54 PM   Specimen: BLOOD LEFT HAND  Result Value Ref Range Status   Specimen Description BLOOD LEFT HAND  Final   Special Requests   Final    BOTTLES DRAWN AEROBIC ONLY Blood Culture adequate volume   Culture   Final    NO GROWTH 3 DAYS Performed at [facility], [address] [place], [place] [address]    Report Status PENDING  Incomplete  CULTURE, BLOOD (ROUTINE X 2) w Reflex to ID Panel     Status: None (Preliminary result)   Collection Time: 04/11/21 11:05 PM    Specimen: BLOOD LEFT HAND  Result Value Ref Range Status   Specimen Description BLOOD LEFT HAND  Final   Special Requests   Final    BOTTLES DRAWN AEROBIC ONLY Blood Culture adequate volume   Culture   Final    NO GROWTH 3 DAYS Performed at [facility], [address], [place], [place] [address]    Report Status PENDING  Incomplete  Culture, Respiratory w Gram Stain     Status: None   Collection Time: 04/12/21 12:31 AM   Specimen: Tracheal Aspirate; Respiratory  Result Value Ref Range Status   Specimen Description   Final    TRACHEAL ASPIRATE Performed at [facility], [address], [place], [place] [address]    Special Requests   Final    NONE Performed at [facility], [place], [place] [address]    Gram Stain   Final    FEW SQUAMOUS EPITHELIAL CELLS PRESENT FEW WBC SEEN FEW GRAM POSITIVE RODS MODERATE GRAM POSITIVE COCCI    Culture   Final    ABUNDANT Consistent with normal respiratory flora. No Pseudomonas species isolated Performed at [facility] [address] [place], [place] [address]    Report Status 04/14/2021 FINAL  Final    Coagulation Studies: Recent Labs    04/11/21 1930  LABPROT 18.4*  INR 1.5*     Urinalysis: Recent Labs    04/11/21 2230  COLORURINE YELLOW*  LABSPEC 1.013  PHURINE 7.0  GLUCOSEU NEGATIVE  HGBUR MODERATE*  BILIRUBINUR NEGATIVE  KETONESUR NEGATIVE  PROTEINUR 100*  NITRITE NEGATIVE  LEUKOCYTESUR LARGE*       Imaging: DG Chest Port 1 View  Result Date: 04/13/2021 CLINICAL DATA:  Acute respiratory failure with hypoxia. EXAM: PORTABLE CHEST 1 VIEW COMPARISON:  One-view chest x-ray VI 21/22 FINDINGS: The endotracheal tube terminates 4.7 cm above the carina. NG tube courses off the inferior border of the film. Right IJ dialysis catheter is stable. The heart is enlarged. Left pleural effusion is slightly decreased prior exam. Moderate pulmonary vascular congestion  remains. Left lower lobe airspace disease noted.  Overall aeration of the left lung improved. Mild atelectasis is present at the right base. IMPRESSION: 1. Improving aeration of the left lung with persistent left lower lobe airspace disease and effusion. 2. Stable moderate pulmonary vascular congestion. 3. The support apparatus is stable. Electronically Signed   By: San Morelle M.D.   On: 04/13/2021 07:48     Medications:    dexmedetomidine (PRECEDEX) IV infusion Stopped (04/12/21 2105)   doxycycline (VIBRAMYCIN) IV 100 mg (04/14/21 1216)   norepinephrine (LEVOPHED) Adult infusion Stopped (04/13/21 0530)   piperacillin-tazobactam (ZOSYN)  IV 2.25 g (04/14/21 0513)   propofol (DIPRIVAN) infusion Stopped (04/12/21 0735)    amLODipine  10 mg Oral Daily   carvedilol  3.125 mg Oral BID WC   chlorhexidine gluconate (MEDLINE KIT)  15 mL Mouth Rinse BID   Chlorhexidine Gluconate Cloth  6 each Topical Q0600   docusate  100 mg Per Tube BID   feeding supplement (VITAL AF 1.2 CAL)  1,000 mL Per Tube Q24H   heparin  5,000 Units Subcutaneous Q8H   mouth rinse  15 mL Mouth Rinse 10 times per day   multivitamin  1 tablet Oral QHS   pantoprazole (PROTONIX) IV  40 mg Intravenous Q24H   patiromer  16.8 g Oral Daily   polyethylene glycol  17 g Per Tube Daily   sevelamer carbonate  800 mg Oral TID WC   sodium bicarbonate  50 mEq Intravenous Once   fentaNYL (SUBLIMAZE) injection, fentaNYL (SUBLIMAZE) injection, midazolam, midazolam, ondansetron **OR** ondansetron (ZOFRAN) IV  Assessment/ Plan:  Larry Morrison is a 50 y.o. 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   -Cardiac arrest/CPR during HD on November 21.  Received about 2 L of fluid during resuscitation.  -Tolerated hemodialysis on November 23.  BFR 400  Anemia with chronic kidney disease:   Lab Results  Component Value Date   HGB 8.8 (L) 04/14/2021  Epogen ordered with hemodialysis  Secondary Hyperparathyroidism: with hyperphosphatemia   Lab Results  Component Value Date   CALCIUM 7.7 (L) 04/14/2021   CAION 0.76 (LL) 08/07/2019   PHOS 3.9 04/14/2021    -Restart sevelamer with meals when able to eat.  5. Acute resp failure Intubated post code Fio2 24% Plan for extubation as per ICU team    LOS: 4 Jamar Casagrande 11/24/202212:23 PM

## 2021-04-15 LAB — RENAL FUNCTION PANEL
Albumin: 1.8 g/dL — ABNORMAL LOW (ref 3.5–5.0)
Anion gap: 9 (ref 5–15)
BUN: 41 mg/dL — ABNORMAL HIGH (ref 6–20)
CO2: 25 mmol/L (ref 22–32)
Calcium: 7.4 mg/dL — ABNORMAL LOW (ref 8.9–10.3)
Chloride: 98 mmol/L (ref 98–111)
Creatinine, Ser: 5.27 mg/dL — ABNORMAL HIGH (ref 0.61–1.24)
GFR, Estimated: 12 mL/min — ABNORMAL LOW (ref >60)
Glucose, Bld: 80 mg/dL (ref 70–99)
Phosphorus: 5.1 mg/dL — ABNORMAL HIGH (ref 2.5–4.6)
Potassium: 4.2 mmol/L (ref 3.5–5.1)
Sodium: 132 mmol/L — ABNORMAL LOW (ref 135–145)

## 2021-04-15 LAB — CBC
HCT: 26.7 % — ABNORMAL LOW (ref 39.0–52.0)
Hemoglobin: 8.2 g/dL — ABNORMAL LOW (ref 13.0–17.0)
MCH: 30.5 pg (ref 26.0–34.0)
MCHC: 30.7 g/dL (ref 30.0–36.0)
MCV: 99.3 fL (ref 80.0–100.0)
Platelets: 141 10*3/uL — ABNORMAL LOW (ref 150–400)
RBC: 2.69 MIL/uL — ABNORMAL LOW (ref 4.22–5.81)
RDW: 17.1 % — ABNORMAL HIGH (ref 11.5–15.5)
WBC: 4.4 10*3/uL (ref 4.0–10.5)
nRBC: 0 % (ref 0.0–0.2)

## 2021-04-15 LAB — GLUCOSE, CAPILLARY
Glucose-Capillary: 126 mg/dL — ABNORMAL HIGH (ref 70–99)
Glucose-Capillary: 63 mg/dL — ABNORMAL LOW (ref 70–99)
Glucose-Capillary: 75 mg/dL (ref 70–99)
Glucose-Capillary: 75 mg/dL (ref 70–99)
Glucose-Capillary: 79 mg/dL (ref 70–99)
Glucose-Capillary: 80 mg/dL (ref 70–99)
Glucose-Capillary: 87 mg/dL (ref 70–99)

## 2021-04-15 LAB — TRIGLYCERIDES: Triglycerides: 40 mg/dL (ref <150)

## 2021-04-15 MED ORDER — ALBUMIN HUMAN 25 % IV SOLN: 25.0000 g | Freq: Once | INTRAVENOUS | Status: AC

## 2021-04-15 MED ORDER — ASCORBIC ACID 500 MG PO TABS
500.0000 mg | ORAL_TABLET | Freq: Two times a day (BID) | ORAL | Status: DC
  Administered 2021-04-15 – 2021-04-20 (×5): 500 mg
  Filled 2021-04-15 (×8): qty 1

## 2021-04-15 MED ORDER — ALBUMIN HUMAN 25 % IV SOLN
INTRAVENOUS | Status: AC
  Administered 2021-04-15: 25 g via INTRAVENOUS
  Filled 2021-04-15: qty 100

## 2021-04-15 MED ORDER — EPOETIN ALFA 4000 UNIT/ML IJ SOLN
INTRAMUSCULAR | Status: AC
  Administered 2021-04-15: 4000 [IU] via INTRAVENOUS_CENTRAL
  Filled 2021-04-15: qty 1

## 2021-04-15 MED ORDER — VITAL AF 1.2 CAL PO LIQD
1000.0000 mL | ORAL | Status: DC
  Administered 2021-04-15: 1000 mL

## 2021-04-15 MED ORDER — HEPARIN SODIUM (PORCINE) 1000 UNIT/ML IJ SOLN
INTRAMUSCULAR | Status: AC
  Filled 2021-04-15: qty 10

## 2021-04-15 MED ORDER — FENTANYL CITRATE PF 50 MCG/ML IJ SOSY: 50.0000 ug | PREFILLED_SYRINGE | INTRAMUSCULAR | Status: DC | PRN

## 2021-04-15 MED ORDER — GLYCOPYRROLATE 0.2 MG/ML IJ SOLN
0.2000 mg | Freq: Once | INTRAMUSCULAR | Status: AC
  Administered 2021-04-15: 0.2 mg via INTRAVENOUS

## 2021-04-15 MED ORDER — DEXTROSE 50 % IV SOLN
12.5000 g | INTRAVENOUS | Status: AC
  Administered 2021-04-15: 12.5 g via INTRAVENOUS

## 2021-04-15 MED ORDER — FREE WATER
30.0000 mL | Status: DC
  Administered 2021-04-15 – 2021-04-16 (×5): 30 mL

## 2021-04-15 NOTE — Progress Notes (Incomplete)
NAME:  Larry Morrison, MRN:  [id number], DOB:  [birth year], LOS: 5 ADMISSION DATE:  04/09/2021, CONSULTATION DATE:  04/11/21 REFERRING MD:  Berle Mull, MD  CHIEF COMPLAINT:  Cardiac Arrest   HPI  50 y.o with significant PMH of ESRD on HD who presented to the ED after an accidental fall at home due to falling asleep in his wheelchair. Came up to MICU post PEA arrest during HD intubated.     04/14/21- patient failed SBT with tachypnea and high volume inspissated respiratory secretions.  He is improving on antibiotics and dialysis.    Past Medical History   Cardiomyopathy    Protein-calorie malnutrition, severe 03/01/2021  Arteriovenous fistula occlusion (HCC)    Pleural effusion 02/28/2021  Arteriovenous fistula thrombosis (HCC) 09/47/0962  Metabolic acidosis 83/66/2947  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 02/28/2021  History of traumatic brain injury 09/07/2020  Cognitive impairment 65/46/5035  Metabolic encephalopathy 46/56/8127  ESRD needing dialysis ([place]) 09/04/2020  Hypoglycemia 09/04/2020  Uremia 09/04/2020  COVID-19 virus infection 06/18/2020  Stroke ([place]) 06/18/2020  Thrombocytopenia ([place]) 06/18/2020  Elevated troponin 06/18/2020  Hyperkalemia 06/13/2020  Anemia in ESRD (end-stage renal disease) ([place]) 09/17/2019  Essential hypertension 07/15/2019  Testicular torsion    Pyuria 06/10/2016  Chronic pain following surgery or procedure 12/01/2015  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 11/20: Hyperkalemia with potassium of 6.2  getting [place].  Patient refusing dialysis today. 11/21: Patient went into PEA Cardiac arrest during HD session, intubated post ROSC and transferred to ICU. PCCM consulted 04/13/21- patient for SBT today to liberate from MV. Plan for HD today as well.    Consults:  Nephrology PCCM  Procedures:  11/21: intubation 11/21: Right Femoral vein central line  Significant Diagnostic Tests:  11/21: Chest Xray>Progressive left basilar consolidation and left effusion. 11/21: Abdominal xray>Enteric catheter tip and side port projecting over the gastric body. Bowel gas pattern is unremarkable. Persistent left basilar consolidation and effusion. 11/21: Noncontrast CT head> 11/19: Chest Xray>Cardiomegaly. Increased homogeneous opacity is seen in the left mid and left lower lung fields suggesting increase in left pleural effusion and possibly underlying atelectasis/pneumonia. Central pulmonary vessels are slightly less prominent. There are no signs of alveolar pulmonary edema in the right lung.    Micro Data:  11/19: SARS-CoV-2 PCR> negative 11/19: Influenza PCR> negative 11/21: Blood culture x2> 11/21: Urine Culture> 11/21: Tracheal aspirate> 11/21: MRSA PCR>>  11/21: Strep pneumo urinary antigen> 11/21: Legionella urinary antigen>  Antimicrobials:  Vancomycin 11/21> Cefepime 11/21>  OBJECTIVE  Blood pressure (!) 86/58, pulse (!) 52, temperature 97.9 F (36.6 C), temperature source Oral, resp. rate 18, height 5' 2.01" (1.575 m), weight 51.3 kg, SpO2 97 %.    Vent Mode: PRVC FiO2 (%):  [24 %] 24 % Set Rate:  [18 bmp] 18 bmp Vt Set:  [450 mL] 450 mL PEEP:  [5 cmH20] 5 cmH20 Plateau Pressure:  [27 cmH20] 27 cmH20   Intake/Output Summary (Last 24 hours) at 04/15/2021 0913 Last data filed at 04/15/2021 0828 Gross per 24 hour  Intake 1715.09 ml  Output 265 ml  Net 1450.09 ml    [employer]  04/12/21 1645 04/13/21 1247 04/15/21 0134  Weight: 49.1 kg 49.1 kg 51.3 kg   Physical  Examination  GENERAL: 50 year-old critically ill patient lying in the bed intubated, mechanically ventilated and sedated EYES: Pupils equal, round, reactive to light and accommodation. No scleral icterus. Extraocular muscles intact.  HEENT: Head atraumatic, normocephalic. Oropharynx and nasopharynx clear.  NECK:  Supple, no jugular venous distention. No thyroid enlargement, no tenderness.  LUNGS: Decreased breath sounds bilaterally, no wheezing, rales, moderate rhonchi. No crepitation. No use of accessory muscles of respiration.  CARDIOVASCULAR: S1, S2 normal. No murmurs, rubs, or gallops.  ABDOMEN: Soft, nontender, nondistended. Bowel sounds present. No organomegaly or mass.  Nephrostomy tube in place draining yellow urine EXTREMITIES: No pedal edema, cyanosis, or clubbing.  NEUROLOGIC: Cranial nerves II through XII are intact.  Muscle strength NOT checked. Sensation intact. Gait not checked.  PSYCHIATRIC: The patient is intubated and sedated SKIN: No obvious rash, lesion, or ulcer.   Labs/imaging that I havepersonally reviewed  (right click and "Reselect all SmartList Selections" daily)     Labs   CBC: Recent Labs  Lab 04/11/21 1449 04/11/21 1930 04/12/21 0540 04/13/21 0450 04/14/21 0515 04/15/21 0410  WBC 5.9 9.1 6.5 5.2 5.6 4.4  NEUTROABS 4.2  --   --   --   --   --   HGB 10.0* 9.0* 9.0* 8.7* 8.8* 8.2*  HCT 30.8* 29.2* 28.6* 27.2* 27.8* 26.7*  MCV 96.3 98.3 97.3 98.2 97.2 99.3  PLT 237 189 187 174 161 141*     Basic Metabolic Panel: Recent Labs  Lab 04/09/21 1653 04/10/21 0746 04/11/21 1449 04/11/21 1930 04/12/21 0540 04/13/21 0450 04/14/21 0515 04/15/21 0410  NA 141   < > 140 141 139 137 133* 132*  K 4.9   < > 6.1* 4.7 5.3* 4.6 3.8 4.2  CL 104   < > 105 106 104 104 98 98  CO2 22   < > 19* 21* '23 22 27 25  ' GLUCOSE 86   < > 122* 92 89 74 85 80  BUN 79*   < > 100* 71* 73* 72* 30* 41*  CREATININE 8.68*   < > 10.31* 7.45* 7.96* 7.71* 4.85* 5.27*  CALCIUM 8.6*   <  > 8.4* 9.0 8.6* 8.0* 7.7* 7.4*  MG 2.3  --   --   --  2.5*  --  1.7  --   PHOS 7.8*  --  9.6*  --  7.1* 7.1* 3.9 5.1*   < > = values in this interval not displayed.    GFR: Estimated Creatinine Clearance: 11.9 mL/min (A) (by C-G formula based on SCr of 5.27 mg/dL (H)). Recent Labs  Lab 04/09/21 1844 04/10/21 0746 04/11/21 0347 04/11/21 1449 04/11/21 2205 04/12/21 0540 04/13/21 0450 04/14/21 0515 04/15/21 0410  PROCALCITON 2.88 3.03 3.54  --   --   --   --   --   --   WBC  --  7.4 6.4   < >  --  6.5 5.2 5.6 4.4  LATICACIDVEN  --   --   --   --  1.5  --   --   --   --    < > = values in this interval not displayed.     Liver Function Tests: Recent Labs  Lab 04/11/21 1449 04/11/21 1930 04/13/21 0450 04/14/21 0515 04/15/21 0410  AST  --  26  --   --   --   ALT  --  15  --   --   --   ALKPHOS  --  105  --   --   --   BILITOT  --  1.3*  --   --   --   PROT  --  7.3  --   --   --   ALBUMIN 2.5* 2.3* 2.1* 2.0* 1.8*    No results for input(s): LIPASE, AMYLASE in the last 168 hours. No results for input(s): AMMONIA in the last 168 hours.  ABG    Component Value Date/Time   PHART 7.40 04/13/2021 0530   PCO2ART 34 04/13/2021 0530   PO2ART 131 (H) 04/13/2021 0530   HCO3 21.1 04/13/2021 0530   TCO2 27 08/07/2019 1018   ACIDBASEDEF 3.2 (H) 04/13/2021 0530   O2SAT 99.0 04/13/2021 0530      Coagulation Profile: Recent Labs  Lab 04/11/21 1930  INR 1.5*     Cardiac Enzymes: No results for input(s): CKTOTAL, CKMB, CKMBINDEX, TROPONINI in the last 168 hours.  HbA1C: Hgb A1c MFr Bld  Date/Time Value Ref Range Status  09/05/2020 05:00 AM 5.0 4.8 - 5.6 % Final    Comment:    (NOTE)         Prediabetes: 5.7 - 6.4         Diabetes: >6.4         Glycemic control for adults with diabetes: <7.0     CBG: Recent Labs  Lab 04/14/21 1111 04/14/21 1536 04/14/21 2328 04/15/21 0741 04/15/21 0810  GLUCAP 80 102* 82 63* 126*     Review of Systems:   UNABLE TO  ASSESS PATIENT IS INTUBATED AND SEDATED  Past Medical History  He,  has a past medical history of Anemia, Aneurysm ([place]), Dialysis patient ([place]), Dyspnea, Foot drop, History of kidney stones, History of nephrostomy, Hypertension, Renal disorder, and Stroke ([place]).   Surgical History    Past Surgical History:  Procedure Laterality Date   A/V SHUNT INTERVENTION N/A 03/01/2021   Procedure: A/V SHUNT INTERVENTION;  Surgeon: Katha Cabal, MD;  Location: [facility];  Service: Cardiovascular;  Laterality: N/A;   AV FISTULA PLACEMENT Right 08/07/2019   Procedure: ARTERIOVENOUS (AV) FISTULA CREATION;  Surgeon: Algernon Huxley, MD;  Location: [facility];  Service: Vascular;  Laterality: Right;   AV FISTULA PLACEMENT Right 11/20/2019   Procedure: INSERTION OF ARTERIOVENOUS (AV) GORE-TEX GRAFT ARM;  Surgeon: Algernon Huxley, MD;  Location: [facility];  Service: Vascular;  Laterality: Right;   IR NEPHROSTOMY EXCHANGE RIGHT  09/10/2020   NEPHRECTOMY Left    NEPHRECTOMY     ORCHIECTOMY Right 10/27/2016   Procedure: PSB ORCHIECTOMY;  Surgeon: Hollice Espy, MD;  Location: [facility];  Service: Urology;  Laterality: Right;   ORCHIOPEXY Bilateral 10/27/2016   Procedure: ORCHIOPEXY ADULT;  Surgeon: Hollice Espy, MD;  Location: [facility];  Service: Urology;  Laterality: Bilateral;   SCROTAL EXPLORATION Bilateral 10/27/2016   Procedure: SCROTUM EXPLORATION;  Surgeon: Hollice Espy, MD;  Location: [facility];  Service: Urology;  Laterality: Bilateral;     Social History   reports that he has quit smoking. His smoking use included cigarettes. He smoked an average of .5 packs per day. He has never used smokeless tobacco. He reports current drug use. Drug: Marijuana. He reports that he does not drink alcohol.   Family History   His Family history is unknown by patient.   Allergies Allergies  Allergen Reactions   Vancomycin     Patient denies -  repeated denial to pharm tech 09-05-2020     Home Medications   Prior to Admission medications   Medication Sig Start Date End Date Taking? Authorizing Provider  aspirin EC 81 MG EC tablet Take 1 tablet (81 mg total) by mouth daily. Swallow whole. 03/04/21   Enzo Bi, MD  carvedilol (COREG) 3.125 MG tablet Take 1 tablet (3.125 mg total) by mouth 2 (two) times daily with a meal. Patient not taking: Reported on 04/09/2021 03/03/21 06/01/21  Enzo Bi, MD  feeding supplement (ENSURE ENLIVE / ENSURE PLUS) LIQD Take 237 mLs by mouth daily. Patient not taking: Reported on 04/09/2021 03/03/21   Enzo Bi, MD  losartan (COZAAR) 25 MG tablet Take 1 tablet (25 mg total) by mouth daily. Patient not taking: Reported on 04/09/2021 03/04/21 06/02/21  Enzo Bi, MD  multivitamin (RENA-VIT) TABS tablet Take 1 tablet by mouth at bedtime. Patient not taking: Reported on 04/09/2021 03/03/21   Enzo Bi, MD  sevelamer carbonate (RENVELA) 800 MG tablet Take 1 tablet (800 mg total) by mouth 3 (three) times daily with meals. Patient not taking: Reported on 04/09/2021 03/03/21 06/01/21  Enzo Bi, MD    Scheduled Meds:  amLODipine  10 mg Oral Daily   carvedilol  3.125 mg Oral BID WC   chlorhexidine gluconate (MEDLINE KIT)  15 mL Mouth Rinse BID   Chlorhexidine Gluconate Cloth  6 each Topical Q0600   docusate  100 mg Per Tube BID   epoetin (EPOGEN/PROCRIT) injection  4,000 Units Intravenous Q M,W,F-HD   feeding supplement (VITAL AF 1.2 CAL)  1,000 mL Per Tube Q24H   heparin  5,000 Units Subcutaneous Q8H   mouth rinse  15 mL Mouth Rinse 10 times per day   multivitamin  1 tablet Oral QHS   pantoprazole (PROTONIX) IV  40 mg Intravenous Q24H   patiromer  16.8 g Oral Daily   polyethylene glycol  17 g Per Tube Daily   sodium bicarbonate  50 mEq Intravenous Once   Continuous Infusions:  albumin human     dexmedetomidine (PRECEDEX) IV infusion Stopped (04/12/21 2105)   doxycycline (VIBRAMYCIN) IV Stopped (04/15/21 0130)   fentaNYL infusion INTRAVENOUS 100 mcg/hr (04/15/21 0828)    norepinephrine (LEVOPHED) Adult infusion Stopped (04/13/21 0530)   piperacillin-tazobactam (ZOSYN)  IV Stopped (04/15/21 0548)   propofol (DIPRIVAN) infusion Stopped (04/12/21 0735)   PRN Meds:.fentaNYL, fentaNYL (SUBLIMAZE) injection, midazolam, ondansetron **OR** ondansetron (ZOFRAN) IV   Active Hospital Problem list   Acute hypoxic respiratory failure Sepsis   Assessment & Plan:  Acute Hypoxic Respiratory Failure in the setting of Pulmonary Edema with Pleural Effusion, Combined severe CHF and probable Pneumonia PMHx: ESRD noncompliance with hemodialysis treatment. -Continue ventilator support & lung protective strategies -Wean PEEP & FiO2 as tolerated, maintain SpO2 > 90% -Head of bed elevated 30 degrees, VAP protocol in place -Plateau pressures less than 30 cm H20  -Intermittent chest x-ray & ABG PRN -Daily WUA with SBT as tolerated  -Ensure adequate pulmonary hygiene  -HD for fluid removal -Budesonide inhaler nebs BID, bronchodilators PRN -PAD protocol in place: continue propofol gtt  PEA Cardiac arrest Suspect in the setting of respiratory arrest from severe metabolic disorder QTc prolongation Elevated Troponin likely demand without dynamic EKG changes Received 1 round of defibrillations UDS + Cocaine -Not meeting criteria for TTM -Echocardiogram ordered -Trend troponin, lactic -Serial EKG  Sepsis with due to suspected Pneumonia  TRACHEAL ASPIRATE +GPC Lactic Acidosis Lactic: 1.5, Baseline PCT: 2.88>3.03>3.54, UA: pending, CXR: Progressive left basilar consolidation and  left effusion. -Follow blood and Urine cultures, trend lactic/ PCT -Check strep pneumo antigen and Legionella -monitor WBC/ fever curve -Start broad coverage with Cefepime  & vancomycin  -Currently not requiring vasopressors. 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 BNP  >4500 Hypertension Urgency PMHx: Cardiomyopathy -Continuous cardiac monitoring -Maintain MAP greater than 65 -HD for fluid removal -Continue Coreg as bp permits. -Hold Losartan for now   ESRD on HD MWF Severe Metabolic Acidosis -Monitor I&O's / urinary output -Follow BMP -Continue Sodium bicarb gtt for now -Ensure adequate renal perfusion -Avoid nephrotoxic agents as able -Replace electrolytes as indicated -HD MWF -Nephrology following, input appreciated   Polysubstance Abuse UDS + Cocaine, Tobacco abuse -Nicotine patch -cessation counseling once able to participate   Best practice:  Diet:  Tube Feed  Pain/Anxiety/Delirium protocol (if indicated): Yes (RASS goal 0) VAP protocol (if indicated): Yes DVT prophylaxis: Subcutaneous Heparin GI prophylaxis: N/A Glucose control:  SSI No Central venous access:  Yes, and it is still needed Arterial line:  N/A Foley:  N/A Mobility:  bed rest  PT consulted: N/A Last date of multidisciplinary goals of care discussion [11/21] Code Status:  full code Disposition: ICU   = Goals of Care = Code Status Order: '@CODE' @   Primary Emergency Contact: [place], Home Phone: [phone] Wishes to pursue full aggressive treatment and intervention options, including CPR and intubation, but goals of care will be addressed on going with family if that should become necessary.  Critical care provider statement:   Total critical care time: 33 minutes   Performed by: Lanney Gins MD   Critical care time was exclusive of separately billable procedures and treating other patients.   Critical care was necessary to treat or prevent imminent or life-threatening deterioration.   Critical care was time spent personally by me on the following activities: development of treatment plan with patient and/or surrogate as well as nursing, discussions with consultants, evaluation of patient's response to treatment, examination of patient, obtaining  history from patient or surrogate, ordering and performing treatments and interventions, ordering and review of laboratory studies, ordering and review of radiographic studies, pulse oximetry and re-evaluation of patient's condition.    Ottie Glazier, M.D.  Pulmonary & Critical Care Medicine

## 2021-04-15 NOTE — Progress Notes (Signed)
[facility]  ROUNDING NOTE   Subjective:   Patient had cardiac arrest during dialysis on monday.  He was intubated and placed on pressors and moved to the ICU. This morning,  he remains intubated. No pressors.  Getting small dose of fentanyl for sedation.  He is able to follow simple commands Underwent hemodialysis successfully on Wednesday Blood flow rate of 400 cc/min was achieved   Objective:  Vital signs in last 24 hours:  Temp:  [97.9 F (36.6 C)-98.4 F (36.9 C)] 97.9 F (36.6 C) (11/25 0800) Pulse Rate:  [52-74] 64 (11/25 1137) Resp:  [12-29] 18 (11/25 1200) BP: (80-141)/(55-92) 111/76 (11/25 1200) SpO2:  [94 %-100 %] 95 % (11/25 1137) FiO2 (%):  [24 %] 24 % (11/25 0800) Weight:  [51.3 kg] 51.3 kg (11/25 0134)  Weight change: 2.2 kg Filed Weights   04/12/21 1645 04/13/21 1247 04/15/21 0134  Weight: 49.1 kg 49.1 kg 51.3 kg    Intake/Output: I/O last 3 completed shifts: In: 1689.1 [I.V.:164.2; NG/GT:874.8; IV Piggyback:650.1] Out: 970 [Urine:545]   Intake/Output this shift:  Total I/O In: 394.3 [I.V.:44.3; IV Piggyback:350] Out: -   Physical Exam: General: Critically ill appearing  Head: ET tube in place  Lungs:  Ventilator assisted, FiO2 24%, decreased breath sounds at bases  Heart: Regular   Abdomen:  Soft, nontender,  Extremities:  no peripheral edema.  Neurologic: Opens eyes,  following simple commands  Skin: Warm   Access: Right IJ permcath  Right nephrostomy  Basic Metabolic Panel: Recent Labs  Lab 04/09/21 1653 04/10/21 0746 04/11/21 1449 04/11/21 1930 04/12/21 0540 04/13/21 0450 04/14/21 0515 04/15/21 0410  NA 141   < > 140 141 139 137 133* 132*  K 4.9   < > 6.1* 4.7 5.3* 4.6 3.8 4.2  CL 104   < > 105 106 104 104 98 98  CO2 22   < > 19* 21* _0 GLUCOSE 86   < > 122* 92 89 74 85 80  BUN 79*   < > 100* 71* 73* 72* 30* 41*  CREATININE 8.68*   < > 10.31* 7.45* 7.96* 7.71* 4.85* 5.27*  CALCIUM 8.6*   < > 8.4* 9.0  8.6* 8.0* 7.7* 7.4*  MG 2.3  --   --   --  2.5*  --  1.7  --   PHOS 7.8*  --  9.6*  --  7.1* 7.1* 3.9 5.1*   < > = values in this interval not displayed.     Liver Function Tests: Recent Labs  Lab 04/11/21 1449 04/11/21 1930 04/13/21 0450 04/14/21 0515 04/15/21 0410  AST  --  26  --   --   --   ALT  --  15  --   --   --   ALKPHOS  --  105  --   --   --   BILITOT  --  1.3*  --   --   --   PROT  --  7.3  --   --   --   ALBUMIN 2.5* 2.3* 2.1* 2.0* 1.8*    No results for input(s): LIPASE, AMYLASE in the last 168 hours. No results for input(s): AMMONIA in the last 168 hours.  CBC: Recent Labs  Lab 04/11/21 1449 04/11/21 1930 04/12/21 0540 04/13/21 0450 04/14/21 0515 04/15/21 0410  WBC 5.9 9.1 6.5 5.2 5.6 4.4  NEUTROABS 4.2  --   --   --   --   --  HGB 10.0* 9.0* 9.0* 8.7* 8.8* 8.2*  HCT 30.8* 29.2* 28.6* 27.2* 27.8* 26.7*  MCV 96.3 98.3 97.3 98.2 97.2 99.3  PLT 237 189 187 174 161 141*     Cardiac Enzymes: No results for input(s): CKTOTAL, CKMB, CKMBINDEX, TROPONINI in the last 168 hours.  BNP: Invalid input(s): POCBNP  CBG: Recent Labs  Lab 04/14/21 1536 04/14/21 2328 04/15/21 0741 04/15/21 0810 04/15/21 1143  GLUCAP 102* 82 63* 126* 80     Microbiology: Results for orders placed or performed during the hospital encounter of 04/09/21  Resp Panel by RT-PCR (Flu A&B, Covid) Nasopharyngeal Swab     Status: None   Collection Time: 04/09/21  7:29 PM   Specimen: Nasopharyngeal Swab; Nasopharyngeal(NP) swabs in vial transport medium  Result Value Ref Range Status   SARS Coronavirus 2 by RT PCR NEGATIVE NEGATIVE Final    Comment: (NOTE) SARS-CoV-2 target nucleic acids are NOT DETECTED.  The SARS-CoV-2 RNA is generally detectable in upper respiratory specimens during the acute phase of infection. The lowest concentration of SARS-CoV-2 viral copies this assay can detect is 138 copies/mL. A negative result does not preclude SARS-Cov-2 infection and should  not be used as the sole basis for treatment or other patient management decisions. A negative result may occur with  improper specimen collection/handling, submission of specimen other than nasopharyngeal swab, presence of viral mutation(s) within the areas targeted by this assay, and inadequate number of viral copies(<138 copies/mL). A negative result must be combined with clinical observations, patient history, and epidemiological information. The expected result is Negative.  Fact Sheet for Patients:  https://www.fda.gov/media/152166/download  Fact Sheet for Healthcare Providers:  https://www.fda.gov/media/152162/download  This test is no t yet approved or cleared by the [place] FDA and  has been authorized for detection and/or diagnosis of SARS-CoV-2 by FDA under an Emergency Use Authorization (EUA). This EUA will remain  in effect (meaning this test can be used) for the duration of the COVID-19 declaration under Section 564(b)(1) of the Act, 21 U.S.C.section 360bbb-3(b)(1), unless the authorization is terminated  or revoked sooner.       Influenza A by PCR NEGATIVE NEGATIVE Final   Influenza B by PCR NEGATIVE NEGATIVE Final    Comment: (NOTE) The Xpert Xpress SARS-CoV-2/FLU/RSV plus assay is intended as an aid in the diagnosis of influenza from Nasopharyngeal swab specimens and should not be used as a sole basis for treatment. Nasal washings and aspirates are unacceptable for Xpert Xpress SARS-CoV-2/FLU/RSV testing.  Fact Sheet for Patients: https://www.fda.gov/media/152166/download  Fact Sheet for Healthcare Providers: https://www.fda.gov/media/152162/download  This test is not yet approved or cleared by the [place] FDA and has been authorized for detection and/or diagnosis of SARS-CoV-2 by FDA under an Emergency Use Authorization (EUA). This EUA will remain in effect (meaning this test can be used) for the duration of the COVID-19 declaration under  Section 564(b)(1) of the Act, 21 U.S.C. section 360bbb-3(b)(1), unless the authorization is terminated or revoked.  Performed at [facility], [address] [place], [place] [address]   MRSA Next Gen by PCR, Nasal     Status: Abnormal   Collection Time: 04/11/21 10:23 PM   Specimen: Nasal Mucosa; Nasal Swab  Result Value Ref Range Status   MRSA by PCR Next Gen DETECTED (A) NOT DETECTED Final    Comment: RESULT CALLED TO, READ BACK BY AND VERIFIED WITH: TESSYAMMA THOMAS @0006 04/12/21 RH (NOTE) The GeneXpert MRSA Assay (FDA approved for NASAL specimens only), is one component of a   comprehensive MRSA colonization surveillance program. It is not intended to diagnose MRSA infection nor to guide or monitor treatment for MRSA infections. Test performance is not FDA approved in patients less than 26 years old. Performed at [facility], [address] [place], [place] [address]   Urine Culture     Status: Abnormal (Preliminary result)   Collection Time: 04/11/21 10:30 PM   Specimen: In/Out Cath Urine  Result Value Ref Range Status   Specimen Description   Final    IN/OUT CATH URINE Performed at [facility], [address], [place], [place] [address]    Special Requests   Final    NONE Performed at [facility], [address], [place], [place] [address]    Culture (A)  Final    >=100,000 COLONIES/mL STAPHYLOCOCCUS AUREUS SUSCEPTIBILITIES TO FOLLOW Performed at [facility], [place] [address], [place], [place] [address]    Report Status PENDING  Incomplete  CULTURE, BLOOD (ROUTINE X 2) w Reflex to ID Panel     Status: None (Preliminary result)   Collection Time: 04/11/21 10:54 PM   Specimen: BLOOD LEFT HAND  Result Value Ref Range Status   Specimen Description BLOOD LEFT HAND  Final   Special Requests   Final    BOTTLES DRAWN AEROBIC ONLY Blood Culture adequate volume   Culture   Final    NO GROWTH 4 DAYS Performed at [facility], [address], [place], [place] [address]    Report Status PENDING  Incomplete  CULTURE, BLOOD (ROUTINE X 2) w Reflex to ID Panel     Status: None (Preliminary result)   Collection Time: 04/11/21 11:05 PM   Specimen: BLOOD LEFT HAND  Result Value Ref Range Status   Specimen Description BLOOD LEFT HAND  Final   Special Requests   Final    BOTTLES DRAWN AEROBIC ONLY Blood Culture adequate volume   Culture   Final    NO GROWTH 4 DAYS Performed at [facility], [address], [place], [place] [address]    Report Status PENDING  Incomplete  Culture, Respiratory w Gram Stain     Status: None   Collection Time: 04/12/21 12:31 AM   Specimen: Tracheal Aspirate; Respiratory  Result Value Ref Range Status   Specimen Description   Final    TRACHEAL ASPIRATE Performed at [facility], [address], [place], [place] [address]    Special Requests   Final    NONE Performed at [facility], [place], [place] [address]    Gram Stain   Final    FEW SQUAMOUS EPITHELIAL CELLS PRESENT FEW WBC SEEN FEW GRAM POSITIVE RODS MODERATE GRAM POSITIVE COCCI    Culture   Final    ABUNDANT Consistent with normal respiratory flora. No Pseudomonas species isolated Performed at [place] [address] [place], [place] [address]    Report Status 04/14/2021 FINAL  Final    Coagulation Studies: No results for input(s): LABPROT, INR in the last 72 hours.   Urinalysis: No results for input(s): COLORURINE, LABSPEC, PHURINE, GLUCOSEU, HGBUR, BILIRUBINUR, KETONESUR, PROTEINUR, UROBILINOGEN, NITRITE, LEUKOCYTESUR in the last 72 hours.  Invalid input(s): APPERANCEUR     Imaging: No results found.   Medications:    albumin human Stopped (04/15/21 1108)   dexmedetomidine (PRECEDEX) IV infusion Stopped (04/12/21 2105)   doxycycline (VIBRAMYCIN) IV 100 mg (04/15/21 1117)   feeding supplement (VITAL AF 1.2 CAL)  fentaNYL  infusion INTRAVENOUS 125 mcg/hr (04/15/21 1118)   norepinephrine (LEVOPHED) Adult infusion Stopped (04/13/21 0530)   piperacillin-tazobactam (ZOSYN)  IV Stopped (04/15/21 0548)   propofol (DIPRIVAN) infusion Stopped (04/12/21 0735)    amLODipine  10 mg Oral Daily   vitamin C  500 mg Per Tube BID   carvedilol  3.125 mg Oral BID WC   chlorhexidine gluconate (MEDLINE KIT)  15 mL Mouth Rinse BID   Chlorhexidine Gluconate Cloth  6 each Topical Q0600   docusate  100 mg Per Tube BID   epoetin (EPOGEN/PROCRIT) injection  4,000 Units Intravenous Q M,W,F-HD   free water  30 mL Per Tube Q4H   heparin  5,000 Units Subcutaneous Q8H   mouth rinse  15 mL Mouth Rinse 10 times per day   multivitamin  1 tablet Oral QHS   pantoprazole (PROTONIX) IV  40 mg Intravenous Q24H   patiromer  16.8 g Oral Daily   polyethylene glycol  17 g Per Tube Daily   sodium bicarbonate  50 mEq Intravenous Once   fentaNYL, fentaNYL (SUBLIMAZE) injection, midazolam, ondansetron **OR** ondansetron (ZOFRAN) IV  Assessment/ Plan:  [name] is a 50 y.o. 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   -Cardiac arrest/CPR during HD on November 21.  Received about 2 L of fluid during resuscitation.  -Tolerated hemodialysis on Wednesday, November 23.  BFR 400  -Plan for routine hemodialysis today.  Anemia with chronic kidney disease:   Lab Results  Component Value Date   HGB 8.2 (L) 04/15/2021  Epogen ordered with hemodialysis  Secondary  Hyperparathyroidism: with hyperphosphatemia   Lab Results  Component Value Date   CALCIUM 7.4 (L) 04/15/2021   CAION 0.76 (LL) 08/07/2019   PHOS 5.1 (H) 04/15/2021    -Restart sevelamer with meals when able to eat.  4. Acute resp failure Intubated post code Fio2 24% Plan for extubation as per ICU team    LOS: [place] 11/25/202212:17 PM

## 2021-04-15 NOTE — Progress Notes (Signed)
NAME:  Larry Morrison, MRN:  [id number], DOB:  [birth year], LOS: 5 ADMISSION DATE:  04/09/2021, CONSULTATION DATE:  04/11/21 REFERRING MD:  Berle Mull, MD  CHIEF COMPLAINT:  Cardiac Arrest   HPI  50 y.o with significant PMH of ESRD on HD who presented to the ED after an accidental fall at home due to falling asleep in his wheelchair. Came up to MICU post PEA arrest during HD intubated.      Events   11/19: Admitted to medsurg unit with volume overload due to missing HD. 11/20: Hyperkalemia with potassium of 6.2 getting [place].  Patient refusing dialysis today. 11/21: Patient went into PEA Cardiac arrest during HD session, intubated post ROSC and transferred to ICU. PCCM consulted 04/13/21- patient for SBT today to liberate from MV. Plan for HD today as well.  04/14/21- patient failed SBT with tachypnea and high volume inspissated respiratory secretions.  He is improving on antibiotics and dialysis.  04/15/21- patient failed SBT due to high secretions, he received glycopyrrolate but failed SBT again high secretions.   Past Medical History   Cardiomyopathy    Protein-calorie malnutrition, severe 03/01/2021  Arteriovenous fistula occlusion (HCC)    Pleural effusion 02/28/2021  Arteriovenous fistula thrombosis (HCC) 60/73/7106  Metabolic acidosis 26/94/8546  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 02/28/2021  History of traumatic brain injury 09/07/2020  Cognitive impairment 27/07/5007  Metabolic encephalopathy 38/18/2993  ESRD needing dialysis ([place]) 09/04/2020  Hypoglycemia 09/04/2020  Uremia 09/04/2020  COVID-19 virus infection 06/18/2020  Stroke ([place]) 06/18/2020  Thrombocytopenia ([place]) 06/18/2020  Elevated troponin 06/18/2020  Hyperkalemia 06/13/2020  Anemia in ESRD (end-stage renal disease) ([place]) 09/17/2019  Essential hypertension 07/15/2019  Testicular torsion    Pyuria 06/10/2016  Chronic pain following surgery or procedure 12/01/2015   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 11/21: Abdominal xray>Enteric catheter tip and side port projecting over the gastric body. Bowel gas pattern is unremarkable. Persistent left basilar consolidation and effusion. 11/21: Noncontrast CT head> 11/19: Chest Xray>Cardiomegaly. Increased homogeneous opacity is seen in the left mid and left lower lung fields suggesting increase in left pleural effusion and possibly underlying atelectasis/pneumonia. Central pulmonary vessels are slightly less prominent. There are no signs of alveolar pulmonary edema in the right lung.    Micro Data:  11/19: SARS-CoV-2 PCR> negative 11/19: Influenza PCR> negative 11/21: Blood culture x2> 11/21: Urine Culture> 11/21: Tracheal aspirate> 11/21: MRSA PCR>>  11/21: Strep pneumo urinary antigen> 11/21: Legionella urinary antigen>  Antimicrobials:  Vancomycin 11/21> Cefepime 11/21>  OBJECTIVE  Blood pressure 101/68, pulse (!) 59, temperature 97.9 F (36.6 C), temperature source Oral, resp. rate 18, height 5' 2.01" (1.575 m), weight 51.3 kg, SpO2 97 %.    Vent Mode: PRVC FiO2 (%):  [24 %] 24 % Set Rate:  [18 bmp] 18 bmp Vt Set:  [450 mL] 450 mL PEEP:  [5 cmH20] 5 cmH20 Plateau Pressure:  [27 cmH20] 27 cmH20   Intake/Output Summary (Last 24 hours) at 04/15/2021 1613 Last data filed at 04/15/2021 0828 Gross per 24 hour  Intake 705.63 ml  Output 265 ml  Net 440.63 ml    Filed  Weights   04/12/21 1645 04/13/21 1247 04/15/21 0134  Weight: 49.1 kg 49.1 kg 51.3 kg   Physical Examination  GENERAL: 50 year-old critically ill patient lying in the bed intubated, mechanically ventilated and sedated EYES: Pupils equal, round, reactive to light and accommodation. No scleral icterus. Extraocular muscles intact.  HEENT: Head atraumatic, normocephalic. Oropharynx and nasopharynx clear.  NECK:  Supple, no jugular venous distention. No thyroid enlargement, no tenderness.  LUNGS: Decreased breath sounds bilaterally, no wheezing, rales, moderate rhonchi. No crepitation. No use of accessory muscles of respiration.  CARDIOVASCULAR: S1, S2 normal. No murmurs, rubs, or gallops.  ABDOMEN: Soft, nontender, nondistended. Bowel sounds present. No organomegaly or mass.  Nephrostomy tube in place draining yellow urine EXTREMITIES: No pedal edema, cyanosis, or clubbing.  NEUROLOGIC: Cranial nerves II through XII are intact.  Muscle strength NOT checked. Sensation intact. Gait not checked.  PSYCHIATRIC: The patient is intubated and sedated SKIN: No obvious rash, lesion, or ulcer.   Labs/imaging that I havepersonally reviewed  (right click and "Reselect all SmartList Selections" daily)     Labs   CBC: Recent Labs  Lab 04/11/21 1449 04/11/21 1930 04/12/21 0540 04/13/21 0450 04/14/21 0515 04/15/21 0410  WBC 5.9 9.1 6.5 5.2 5.6 4.4  NEUTROABS 4.2  --   --   --   --   --   HGB 10.0* 9.0* 9.0* 8.7* 8.8* 8.2*  HCT 30.8* 29.2* 28.6* 27.2* 27.8* 26.7*  MCV 96.3 98.3 97.3 98.2 97.2 99.3  PLT 237 189 187 174 161 141*     Basic Metabolic Panel: Recent Labs  Lab 04/09/21 1653 04/10/21 0746 04/11/21 1449 04/11/21 1930 04/12/21 0540 04/13/21 0450 04/14/21 0515 04/15/21 0410  NA 141   < > 140 141 139 137 133* 132*  K 4.9   < > 6.1* 4.7 5.3* 4.6 3.8 4.2  CL 104   < > 105 106 104 104 98 98  CO2 22   < > 19* 21* _0 GLUCOSE 86   < > 122* 92 89 74 85 80  BUN 79*   < > 100*  71* 73* 72* 30* 41*  CREATININE 8.68*   < > 10.31* 7.45* 7.96* 7.71* 4.85* 5.27*  CALCIUM 8.6*   < > 8.4* 9.0 8.6* 8.0* 7.7* 7.4*  MG 2.3  --   --   --  2.5*  --  1.7  --   PHOS 7.8*  --  9.6*  --  7.1* 7.1* 3.9 5.1*   < > = values in this interval not displayed.    GFR: Estimated Creatinine Clearance: 11.9 mL/min (A) (by C-G formula based on SCr of 5.27 mg/dL (H)). Recent Labs  Lab 04/09/21 1844 04/10/21 0746 04/11/21 0347 04/11/21 1449 04/11/21 2205 04/12/21 0540 04/13/21 0450 04/14/21 0515 04/15/21 0410  PROCALCITON 2.88 3.03 3.54  --   --   --   --   --   --   WBC  --  7.4 6.4   < >  --  6.5 5.2 5.6 4.4  LATICACIDVEN  --   --   --   --  1.5  --   --   --   --    < > = values in this interval not displayed.     Liver Function Tests: Recent Labs  Lab 04/11/21 1449 04/11/21 1930 04/13/21 0450 04/14/21 0515 04/15/21 0410  AST  --  26  --   --   --  ALT  --  15  --   --   --   ALKPHOS  --  105  --   --   --   BILITOT  --  1.3*  --   --   --   PROT  --  7.3  --   --   --   ALBUMIN 2.5* 2.3* 2.1* 2.0* 1.8*    No results for input(s): LIPASE, AMYLASE in the last 168 hours. No results for input(s): AMMONIA in the last 168 hours.  ABG    Component Value Date/Time   PHART 7.40 04/13/2021 0530   PCO2ART 34 04/13/2021 0530   PO2ART 131 (H) 04/13/2021 0530   HCO3 21.1 04/13/2021 0530   TCO2 27 08/07/2019 1018   ACIDBASEDEF 3.2 (H) 04/13/2021 0530   O2SAT 99.0 04/13/2021 0530      Coagulation Profile: Recent Labs  Lab 04/11/21 1930  INR 1.5*     Cardiac Enzymes: No results for input(s): CKTOTAL, CKMB, CKMBINDEX, TROPONINI in the last 168 hours.  HbA1C: Hgb A1c MFr Bld  Date/Time Value Ref Range Status  09/05/2020 05:00 AM 5.0 4.8 - 5.6 % Final    Comment:    (NOTE)         Prediabetes: 5.7 - 6.4         Diabetes: >6.4         Glycemic control for adults with diabetes: <7.0     CBG: Recent Labs  Lab 04/14/21 1536 04/14/21 2328 04/15/21 0741  04/15/21 0810 04/15/21 1143  GLUCAP 102* 82 63* 126* 80     Review of Systems:   UNABLE TO ASSESS PATIENT IS INTUBATED AND SEDATED  Past Medical History  He,  has a past medical history of Anemia, Aneurysm ([place]), Dialysis patient ([place]), Dyspnea, Foot drop, History of kidney stones, History of nephrostomy, Hypertension, Renal disorder, and Stroke ([place]).   Surgical History    Past Surgical History:  Procedure Laterality Date   A/V SHUNT INTERVENTION N/A 03/01/2021   Procedure: A/V SHUNT INTERVENTION;  Surgeon: Katha Cabal, MD;  Location: [facility];  Service: Cardiovascular;  Laterality: N/A;   AV FISTULA PLACEMENT Right 08/07/2019   Procedure: ARTERIOVENOUS (AV) FISTULA CREATION;  Surgeon: Algernon Huxley, MD;  Location: [facility];  Service: Vascular;  Laterality: Right;   AV FISTULA PLACEMENT Right 11/20/2019   Procedure: INSERTION OF ARTERIOVENOUS (AV) GORE-TEX GRAFT ARM;  Surgeon: Algernon Huxley, MD;  Location: [facility];  Service: Vascular;  Laterality: Right;   IR NEPHROSTOMY EXCHANGE RIGHT  09/10/2020   NEPHRECTOMY Left    NEPHRECTOMY     ORCHIECTOMY Right 10/27/2016   Procedure: PSB ORCHIECTOMY;  Surgeon: Hollice Espy, MD;  Location: [facility];  Service: Urology;  Laterality: Right;   ORCHIOPEXY Bilateral 10/27/2016   Procedure: ORCHIOPEXY ADULT;  Surgeon: Hollice Espy, MD;  Location: [facility];  Service: Urology;  Laterality: Bilateral;   SCROTAL EXPLORATION Bilateral 10/27/2016   Procedure: SCROTUM EXPLORATION;  Surgeon: Hollice Espy, MD;  Location: [facility];  Service: Urology;  Laterality: Bilateral;     Social History   reports that he has quit smoking. His smoking use included cigarettes. He smoked an average of .5 packs per day. He has never used smokeless tobacco. He reports current drug use. Drug: Marijuana. He reports that he does not drink alcohol.   Family History   His Family history is unknown by patient.   Allergies Allergies  Allergen Reactions    Vancomycin  Patient denies - repeated denial to pharm tech 09-05-2020     Home Medications  Prior to Admission medications   Medication Sig Start Date End Date Taking? Authorizing Provider  aspirin EC 81 MG EC tablet Take 1 tablet (81 mg total) by mouth daily. Swallow whole. 03/04/21   Enzo Bi, MD  carvedilol (COREG) 3.125 MG tablet Take 1 tablet (3.125 mg total) by mouth 2 (two) times daily with a meal. Patient not taking: Reported on 04/09/2021 03/03/21 06/01/21  Enzo Bi, MD  feeding supplement (ENSURE ENLIVE / ENSURE PLUS) LIQD Take 237 mLs by mouth daily. Patient not taking: Reported on 04/09/2021 03/03/21   Enzo Bi, MD  losartan (COZAAR) 25 MG tablet Take 1 tablet (25 mg total) by mouth daily. Patient not taking: Reported on 04/09/2021 03/04/21 06/02/21  Enzo Bi, MD  multivitamin (RENA-VIT) TABS tablet Take 1 tablet by mouth at bedtime. Patient not taking: Reported on 04/09/2021 03/03/21   Enzo Bi, MD  sevelamer carbonate (RENVELA) 800 MG tablet Take 1 tablet (800 mg total) by mouth 3 (three) times daily with meals. Patient not taking: Reported on 04/09/2021 03/03/21 06/01/21  Enzo Bi, MD    Scheduled Meds:  epoetin alfa       heparin sodium (porcine)       amLODipine  10 mg Oral Daily   vitamin C  500 mg Per Tube BID   carvedilol  3.125 mg Oral BID WC   chlorhexidine gluconate (MEDLINE KIT)  15 mL Mouth Rinse BID   Chlorhexidine Gluconate Cloth  6 each Topical Q0600   docusate  100 mg Per Tube BID   epoetin (EPOGEN/PROCRIT) injection  4,000 Units Intravenous Q M,W,F-HD   free water  30 mL Per Tube Q4H   heparin  5,000 Units Subcutaneous Q8H   mouth rinse  15 mL Mouth Rinse 10 times per day   multivitamin  1 tablet Oral QHS   pantoprazole (PROTONIX) IV  40 mg Intravenous Q24H   patiromer  16.8 g Oral Daily   polyethylene glycol  17 g Per Tube Daily   sodium bicarbonate  50 mEq Intravenous Once   Continuous Infusions:  albumin human     albumin human Stopped  (04/15/21 1108)   dexmedetomidine (PRECEDEX) IV infusion Stopped (04/12/21 2105)   doxycycline (VIBRAMYCIN) IV 100 mg (04/15/21 1117)   feeding supplement (VITAL AF 1.2 CAL) 1,000 mL (04/15/21 1303)   fentaNYL infusion INTRAVENOUS 125 mcg/hr (04/15/21 1118)   norepinephrine (LEVOPHED) Adult infusion Stopped (04/13/21 0530)   piperacillin-tazobactam (ZOSYN)  IV 2.25 g (04/15/21 1610)   propofol (DIPRIVAN) infusion Stopped (04/12/21 0735)   PRN Meds:.fentaNYL, fentaNYL (SUBLIMAZE) injection, midazolam, ondansetron **OR** ondansetron (ZOFRAN) IV   Active Hospital Problem list   Acute hypoxic respiratory failure Sepsis   Assessment & Plan:  Acute Hypoxic Respiratory Failure in the setting of Pulmonary Edema post ACLS and missed HD -Continue ventilator support & lung protective strategies -Wean PEEP & FiO2 as tolerated, maintain SpO2 > 90% -Head of bed elevated 30 degrees, VAP protocol in place -Plateau pressures less than 30 cm H20  -Intermittent chest x-ray & ABG PRN -Daily WUA with SBT as tolerated  -Ensure adequate pulmonary hygiene  -HD for fluid removal -Budesonide inhaler nebs BID, bronchodilators PRN -PAD protocol in place: continue propofol gtt  PEA Carrest Suspect in the setting of respiratory arrest from severe metabolic disorder QTc prolongation Elevated Troponin likely demand without dynamic EKG changes Received 1 round of defibrillations UDS + Cocaine -Not meeting criteria  for TTM -Echocardiogram ordered -Trend troponin, lactic -Serial EKG  Sepsis with due to suspected Pneumonia  TRACHEAL ASPIRATE +GPC Lactic Acidosis Lactic: 1.5, Baseline PCT: 2.88>3.03>3.54, UA: pending, CXR: Progressive left basilar consolidation and left effusion. -Follow blood and Urine cultures, trend lactic/ PCT -Check strep pneumo antigen and Legionella -monitor WBC/ fever curve -Start broad coverage with Cefepime  & vancomycin  -Currently not requiring vasopressors. 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 BNP >4500 Hypertension Urgency PMHx: Cardiomyopathy -Continuous cardiac monitoring -Maintain MAP greater than 65 -HD for fluid removal -Continue Coreg as bp permits. -Hold Losartan for now   ESRD on HD MWF Severe Metabolic Acidosis -Monitor I&O's / urinary output -Follow BMP -Continue Sodium bicarb gtt for now -Ensure adequate renal perfusion -Avoid nephrotoxic agents as able -Replace electrolytes as indicated -HD MWF -Nephrology following, input appreciated   Polysubstance Abuse UDS + Cocaine, Tobacco abuse -Nicotine patch -cessation counseling once able to participate   Best practice:  Diet:  Tube Feed  Pain/Anxiety/Delirium protocol (if indicated): Yes (RASS goal 0) VAP protocol (if indicated): Yes DVT prophylaxis: Subcutaneous Heparin GI prophylaxis: N/A Glucose control:  SSI No Central venous access:  Yes, and it is still needed Arterial line:  N/A Foley:  N/A Mobility:  bed rest  PT consulted: N/A Last date of multidisciplinary goals of care discussion [11/21] Code Status:  full code Disposition: ICU   = Goals of Care = Code Status Order: _0 @   Primary Emergency Contact: Mine La Motte, Home Phone: [phone] Wishes to pursue full aggressive treatment and intervention options, including CPR and intubation, but goals of care will be addressed on going with family if that should become necessary.  Critical care provider statement:   Total critical care time: 33 minutes   Performed by: Lanney Gins MD   Critical care time was exclusive of separately billable procedures and treating other patients.   Critical care was necessary to treat or prevent imminent or life-threatening deterioration.   Critical care was time spent personally by me on the following activities: development of treatment plan with patient and/or  surrogate as well as nursing, discussions with consultants, evaluation of patient's response to treatment, examination of patient, obtaining history from patient or surrogate, ordering and performing treatments and interventions, ordering and review of laboratory studies, ordering and review of radiographic studies, pulse oximetry and re-evaluation of patient's condition.    Ottie Glazier, M.D.  Pulmonary & Critical Care Medicine

## 2021-04-16 ENCOUNTER — Inpatient Hospital Stay: Payer: Medicaid Other

## 2021-04-16 LAB — RENAL FUNCTION PANEL
Albumin: 2.4 g/dL — ABNORMAL LOW (ref 3.5–5.0)
Anion gap: 9 (ref 5–15)
BUN: 20 mg/dL (ref 6–20)
CO2: 29 mmol/L (ref 22–32)
Calcium: 8.2 mg/dL — ABNORMAL LOW (ref 8.9–10.3)
Chloride: 97 mmol/L — ABNORMAL LOW (ref 98–111)
Creatinine, Ser: 3.24 mg/dL — ABNORMAL HIGH (ref 0.61–1.24)
GFR, Estimated: 22 mL/min — ABNORMAL LOW (ref >60)
Glucose, Bld: 90 mg/dL (ref 70–99)
Phosphorus: 3.4 mg/dL (ref 2.5–4.6)
Potassium: 4 mmol/L (ref 3.5–5.1)
Sodium: 135 mmol/L (ref 135–145)

## 2021-04-16 LAB — CBC
HCT: 26.7 % — ABNORMAL LOW (ref 39.0–52.0)
Hemoglobin: 8.5 g/dL — ABNORMAL LOW (ref 13.0–17.0)
MCH: 31.6 pg (ref 26.0–34.0)
MCHC: 31.8 g/dL (ref 30.0–36.0)
MCV: 99.3 fL (ref 80.0–100.0)
Platelets: 153 10*3/uL (ref 150–400)
RBC: 2.69 MIL/uL — ABNORMAL LOW (ref 4.22–5.81)
RDW: 17.1 % — ABNORMAL HIGH (ref 11.5–15.5)
WBC: 5.2 10*3/uL (ref 4.0–10.5)
nRBC: 0 % (ref 0.0–0.2)

## 2021-04-16 LAB — GLUCOSE, CAPILLARY
Glucose-Capillary: 133 mg/dL — ABNORMAL HIGH (ref 70–99)
Glucose-Capillary: 67 mg/dL — ABNORMAL LOW (ref 70–99)
Glucose-Capillary: 68 mg/dL — ABNORMAL LOW (ref 70–99)
Glucose-Capillary: 83 mg/dL (ref 70–99)
Glucose-Capillary: 85 mg/dL (ref 70–99)
Glucose-Capillary: 85 mg/dL (ref 70–99)
Glucose-Capillary: 94 mg/dL (ref 70–99)

## 2021-04-16 LAB — CULTURE, BLOOD (ROUTINE X 2)
Culture: NO GROWTH
Culture: NO GROWTH
Special Requests: ADEQUATE
Special Requests: ADEQUATE

## 2021-04-16 LAB — URINE CULTURE: Culture: >=100000 — AB

## 2021-04-16 LAB — PHOSPHORUS: Phosphorus: 3.5 mg/dL (ref 2.5–4.6)

## 2021-04-16 LAB — MAGNESIUM: Magnesium: 1.8 mg/dL (ref 1.7–2.4)

## 2021-04-16 MED ORDER — HYDRALAZINE HCL 20 MG/ML IJ SOLN
10.0000 mg | INTRAMUSCULAR | Status: DC | PRN
  Administered 2021-04-17: 20 mg via INTRAVENOUS
  Filled 2021-04-16: qty 1

## 2021-04-16 MED ORDER — DEXTROSE 10 % IV SOLN: INTRAVENOUS | Status: DC

## 2021-04-16 MED ORDER — DEXTROSE 50 % IV SOLN
INTRAVENOUS | Status: AC
  Administered 2021-04-16: 50 mL
  Filled 2021-04-16: qty 50

## 2021-04-16 MED ORDER — DEXTROSE 50 % IV SOLN
25.0000 mL | Freq: Once | INTRAVENOUS | Status: AC
  Administered 2021-04-16: 25 mL via INTRAVENOUS
  Filled 2021-04-16: qty 50

## 2021-04-16 NOTE — Progress Notes (Addendum)
RT called to room to assess patients Endotracheal tube, RT found ET tube to completely out of patients lungs and the balloon sitting in the back of his mouth. Tube completely removed and patient placed on a 2L [place] with sats staying in the mid to upper 90's. Dr. Lanney Gins notified of self-extubation and patients status. Patient is awake and answering questions for RN.

## 2021-04-16 NOTE — Progress Notes (Signed)
NAME:  Larry Morrison, MRN:  [id number], DOB:  February 03, 1971, LOS: 6 ADMISSION DATE:  04/09/2021, CONSULTATION DATE:  04/11/21 REFERRING MD:  Berle Mull, MD  CHIEF COMPLAINT:  Cardiac Arrest   HPI  50 y.o with significant PMH of ESRD on HD who presented to the ED after an accidental fall at home due to falling asleep in his wheelchair. Came up to MICU post PEA arrest during HD intubated.      Events   11/19: Admitted to medsurg unit with volume overload due to missing HD. 11/20: Hyperkalemia with potassium of 6.2 getting [place].  Patient refusing dialysis today. 11/21: Patient went into PEA Cardiac arrest during HD session, intubated post ROSC and transferred to ICU. PCCM consulted 04/13/21- patient for SBT today to liberate from MV. Plan for HD today as well.  04/14/21- patient failed SBT with tachypnea and high volume inspissated respiratory secretions.  He is improving on antibiotics and dialysis.  04/15/21- patient failed SBT due to high secretions, he received glycopyrrolate but failed SBT again high secretions. 04/16/21- patient has improved will try to liberate from mechanical ventilator today.    Past Medical History   Cardiomyopathy    Protein-calorie malnutrition, severe 03/01/2021  Arteriovenous fistula occlusion (HCC)    Pleural effusion 02/28/2021  Arteriovenous fistula thrombosis (HCC) 51/76/1607  Metabolic acidosis 37/02/6268  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 02/28/2021  History of traumatic brain injury 09/07/2020  Cognitive impairment 48/54/6270  Metabolic encephalopathy 35/00/9381  ESRD needing dialysis ([place]) 09/04/2020  Hypoglycemia 09/04/2020  Uremia 09/04/2020  COVID-19 virus infection 06/18/2020  Stroke ([place]) 06/18/2020  Thrombocytopenia ([place]) 06/18/2020  Elevated troponin 06/18/2020  Hyperkalemia 06/13/2020  Anemia in ESRD (end-stage renal disease) ([place]) 09/17/2019  Essential hypertension 07/15/2019  Testicular torsion     Pyuria 06/10/2016  Chronic pain following surgery or procedure 12/01/2015  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 11/21: Abdominal xray>Enteric catheter tip and side port projecting over the gastric body. Bowel gas pattern is unremarkable. Persistent left basilar consolidation and effusion. 11/21: Noncontrast CT head> 11/19: Chest Xray>Cardiomegaly. Increased homogeneous opacity is seen in the left mid and left lower lung fields suggesting increase in left pleural effusion and possibly underlying atelectasis/pneumonia. Central pulmonary vessels are slightly less prominent. There are no signs of alveolar pulmonary edema in the right lung.    Micro Data:  11/19: SARS-CoV-2 PCR> negative 11/19: Influenza PCR> negative 11/21: Blood culture x2> 11/21: Urine Culture> 11/21: Tracheal aspirate> 11/21: MRSA PCR>>  11/21: Strep pneumo urinary antigen> 11/21: Legionella urinary antigen>  Antimicrobials:  Vancomycin 11/21> Cefepime 11/21>  OBJECTIVE  Blood pressure 126/80, pulse 74, temperature 97.9 F (36.6 C), temperature source Axillary, resp. rate 12, height 5' 2.01" (1.575 m), weight 51.3 kg, SpO2 96 %.    Vent Mode: PSV;CPAP FiO2 (%):  [24 %-30 %] 30 % Set Rate:  [18 bmp] 18 bmp Vt Set:  [450 mL] 450 mL PEEP:  [5 cmH20] 5 cmH20 Pressure Support:  [10 cmH20] 10 cmH20 Plateau Pressure:  [25 cmH20-27 cmH20] 25 cmH20   Intake/Output  Summary (Last 24 hours) at  04/16/2021 1106 Last data filed at 04/16/2021 2248 Gross per 24 hour  Intake 700.61 ml  Output -113 ml  Net 813.61 ml    Filed Weights   04/13/21 1247 04/15/21 0134 04/15/21 1728  Weight: 49.1 kg 51.3 kg 51.3 kg   Physical Examination  GENERAL: 50 year-old critically ill patient lying in the bed intubated, mechanically ventilated and sedated EYES: Pupils equal, round, reactive to light and accommodation. No scleral icterus. Extraocular muscles intact.  HEENT: Head atraumatic, normocephalic. Oropharynx and nasopharynx clear.  NECK:  Supple, no jugular venous distention. No thyroid enlargement, no tenderness.  LUNGS: Decreased breath sounds bilaterally, no wheezing, rales, moderate rhonchi. No crepitation. No use of accessory muscles of respiration.  CARDIOVASCULAR: S1, S2 normal. No murmurs, rubs, or gallops.  ABDOMEN: Soft, nontender, nondistended. Bowel sounds present. No organomegaly or mass.  Nephrostomy tube in place draining yellow urine EXTREMITIES: No pedal edema, cyanosis, or clubbing.  NEUROLOGIC: Cranial nerves II through XII are intact.  Muscle strength NOT checked. Sensation intact. Gait not checked.  PSYCHIATRIC: The patient is intubated and sedated SKIN: No obvious rash, lesion, or ulcer.   Labs/imaging that I havepersonally reviewed  (right click and "Reselect all SmartList Selections" daily)     Labs   CBC: Recent Labs  Lab 04/11/21 1449 04/11/21 1930 04/12/21 0540 04/13/21 0450 04/14/21 0515 04/15/21 0410 04/16/21 0352  WBC 5.9   < > 6.5 5.2 5.6 4.4 5.2  NEUTROABS 4.2  --   --   --   --   --   --   HGB 10.0*   < > 9.0* 8.7* 8.8* 8.2* 8.5*  HCT 30.8*   < > 28.6* 27.2* 27.8* 26.7* 26.7*  MCV 96.3   < > 97.3 98.2 97.2 99.3 99.3  PLT 237   < > 187 174 161 141* 153   < > = values in this interval not displayed.     Basic Metabolic Panel: Recent Labs  Lab 04/09/21 1653 04/10/21 0746 04/12/21 0540 04/13/21 0450 04/14/21 0515 04/15/21 0410  04/16/21 0352  NA 141   < > 139 137 133* 132* 135  K 4.9   < > 5.3* 4.6 3.8 4.2 4.0  CL 104   < > 104 104 98 98 97*  CO2 22   < > '23 22 27 25 29  ' GLUCOSE 86   < > 89 74 85 80 90  BUN 79*   < > 73* 72* 30* 41* 20  CREATININE 8.68*   < > 7.96* 7.71* 4.85* 5.27* 3.24*  CALCIUM 8.6*   < > 8.6* 8.0* 7.7* 7.4* 8.2*  MG 2.3  --  2.5*  --  1.7  --  1.8  PHOS 7.8*   < > 7.1* 7.1* 3.9 5.1* 3.4  3.5   < > = values in this interval not displayed.    GFR: Estimated Creatinine Clearance: 19.4 mL/min (A) (by C-G formula based on SCr of 3.24 mg/dL (H)). Recent Labs  Lab 04/09/21 1844 04/10/21 0746 04/11/21 0347 04/11/21 1449 04/11/21 2205 04/12/21 0540 04/13/21 0450 04/14/21 0515 04/15/21 0410 04/16/21 0352  PROCALCITON 2.88 3.03 3.54  --   --   --   --   --   --   --   WBC  --  7.4 6.4   < >  --    < > 5.2 5.6 4.4 5.2  LATICACIDVEN  --   --   --   --  1.5  --   --   --   --   --    < > = values in this interval not displayed.     Liver Function Tests: Recent Labs  Lab 04/11/21 1930 04/13/21 0450 04/14/21 0515 04/15/21 0410 04/16/21 0352  AST 26  --   --   --   --   ALT 15  --   --   --   --   ALKPHOS 105  --   --   --   --   BILITOT 1.3*  --   --   --   --   PROT 7.3  --   --   --   --   ALBUMIN 2.3* 2.1* 2.0* 1.8* 2.4*    No results for input(s): LIPASE, AMYLASE in the last 168 hours. No results for input(s): AMMONIA in the last 168 hours.  ABG    Component Value Date/Time   PHART 7.40 04/13/2021 0530   PCO2ART 34 04/13/2021 0530   PO2ART 131 (H) 04/13/2021 0530   HCO3 21.1 04/13/2021 0530   TCO2 27 08/07/2019 1018   ACIDBASEDEF 3.2 (H) 04/13/2021 0530   O2SAT 99.0 04/13/2021 0530      Coagulation Profile: Recent Labs  Lab 04/11/21 1930  INR 1.5*     Cardiac Enzymes: No results for input(s): CKTOTAL, CKMB, CKMBINDEX, TROPONINI in the last 168 hours.  HbA1C: Hgb A1c MFr Bld  Date/Time Value Ref Range Status  09/05/2020 05:00 AM 5.0 4.8 - 5.6 % Final     Comment:    (NOTE)         Prediabetes: 5.7 - 6.4         Diabetes: >6.4         Glycemic control for adults with diabetes: <7.0     CBG: Recent Labs  Lab 04/15/21 1614 04/15/21 1920 04/15/21 2348 04/16/21 0732 04/16/21 0845  GLUCAP 75 87 79 67* 133*     Review of Systems:   UNABLE TO ASSESS PATIENT IS INTUBATED AND SEDATED  Past Medical History  He,  has a past medical history of Anemia, Aneurysm ([place]), Dialysis patient ([place]), Dyspnea, Foot drop, History of kidney stones, History of nephrostomy, Hypertension, Renal disorder, and Stroke ([place]).   Surgical History    Past Surgical History:  Procedure Laterality Date   A/V SHUNT INTERVENTION N/A 03/01/2021   Procedure: A/V SHUNT INTERVENTION;  Surgeon: Katha Cabal, MD;  Location: [facility];  Service: Cardiovascular;  Laterality: N/A;   AV FISTULA PLACEMENT Right 08/07/2019   Procedure: ARTERIOVENOUS (AV) FISTULA CREATION;  Surgeon: Algernon Huxley, MD;  Location: [facility];  Service: Vascular;  Laterality: Right;   AV FISTULA PLACEMENT Right 11/20/2019   Procedure: INSERTION OF ARTERIOVENOUS (AV) GORE-TEX GRAFT ARM;  Surgeon: Algernon Huxley, MD;  Location: [facility];  Service: Vascular;  Laterality: Right;   IR NEPHROSTOMY EXCHANGE RIGHT  09/10/2020   NEPHRECTOMY Left    NEPHRECTOMY     ORCHIECTOMY Right 10/27/2016   Procedure: PSB ORCHIECTOMY;  Surgeon: Hollice Espy, MD;  Location: [facility];  Service: Urology;  Laterality: Right;   ORCHIOPEXY Bilateral 10/27/2016   Procedure: ORCHIOPEXY ADULT;  Surgeon: Hollice Espy, MD;  Location: [facility];  Service: Urology;  Laterality: Bilateral;   SCROTAL EXPLORATION Bilateral 10/27/2016   Procedure: SCROTUM EXPLORATION;  Surgeon: Hollice Espy, MD;  Location: [facility];  Service: Urology;  Laterality: Bilateral;     Social History   reports that he  has quit smoking. His smoking use included cigarettes. He smoked an average of .5 packs per day. He has never used  smokeless tobacco. He reports current drug use. Drug: Marijuana. He reports that he does not drink alcohol.   Family History   His Family history is unknown by patient.   Allergies Allergies  Allergen Reactions   Vancomycin     Patient denies - repeated denial to pharm tech 09-05-2020     Home Medications  Prior to Admission medications   Medication Sig Start Date End Date Taking? Authorizing Provider  aspirin EC 81 MG EC tablet Take 1 tablet (81 mg total) by mouth daily. Swallow whole. 03/04/21   Enzo Bi, MD  carvedilol (COREG) 3.125 MG tablet Take 1 tablet (3.125 mg total) by mouth 2 (two) times daily with a meal. Patient not taking: Reported on 04/09/2021 03/03/21 06/01/21  Enzo Bi, MD  feeding supplement (ENSURE ENLIVE / ENSURE PLUS) LIQD Take 237 mLs by mouth daily. Patient not taking: Reported on 04/09/2021 03/03/21   Enzo Bi, MD  losartan (COZAAR) 25 MG tablet Take 1 tablet (25 mg total) by mouth daily. Patient not taking: Reported on 04/09/2021 03/04/21 06/02/21  Enzo Bi, MD  multivitamin (RENA-VIT) TABS tablet Take 1 tablet by mouth at bedtime. Patient not taking: Reported on 04/09/2021 03/03/21   Enzo Bi, MD  sevelamer carbonate (RENVELA) 800 MG tablet Take 1 tablet (800 mg total) by mouth 3 (three) times daily with meals. Patient not taking: Reported on 04/09/2021 03/03/21 06/01/21  Enzo Bi, MD    Scheduled Meds:  amLODipine  10 mg Oral Daily   vitamin C  500 mg Per Tube BID   carvedilol  3.125 mg Oral BID WC   chlorhexidine gluconate (MEDLINE KIT)  15 mL Mouth Rinse BID   Chlorhexidine Gluconate Cloth  6 each Topical Q0600   docusate  100 mg Per Tube BID   epoetin (EPOGEN/PROCRIT) injection  4,000 Units Intravenous Q M,W,F-HD   free water  30 mL Per Tube Q4H   heparin  5,000 Units Subcutaneous Q8H   mouth rinse  15 mL Mouth Rinse 10 times per day   multivitamin  1 tablet Oral QHS   pantoprazole (PROTONIX) IV  40 mg Intravenous Q24H   patiromer  16.8 g Oral  Daily   polyethylene glycol  17 g Per Tube Daily   sodium bicarbonate  50 mEq Intravenous Once   Continuous Infusions:  dextrose 10 mL/hr at 04/16/21 0442   doxycycline (VIBRAMYCIN) IV 100 mg (04/15/21 2246)   feeding supplement (VITAL AF 1.2 CAL) 1,000 mL (04/15/21 1303)   fentaNYL infusion INTRAVENOUS Stopped (04/16/21 1046)   norepinephrine (LEVOPHED) Adult infusion Stopped (04/13/21 0530)   piperacillin-tazobactam (ZOSYN)  IV 2.25 g (04/16/21 0647)   PRN Meds:.fentaNYL (SUBLIMAZE) injection, midazolam, ondansetron **OR** ondansetron (ZOFRAN) IV   Active Hospital Problem list   Acute hypoxic respiratory failure Sepsis   Assessment & Plan:  Acute Hypoxic Respiratory Failure in the setting of Pulmonary Edema post ACLS and missed HD -Continue ventilator support & lung protective strategies -Wean PEEP & FiO2 as tolerated, maintain SpO2 > 90% -Head of bed elevated 30 degrees, VAP protocol in place -Plateau pressures less than 30 cm H20  -Intermittent chest x-ray & ABG PRN -Daily WUA with SBT as tolerated  -Ensure adequate pulmonary hygiene  -HD for fluid removal -Budesonide inhaler nebs BID, bronchodilators PRN -PAD protocol in place: continue propofol gtt           -  SBT with plan for extubation today  PEA Carrest-RESOLVED Suspect in the setting of respiratory arrest from severe metabolic disorder QTc prolongation Elevated Troponin likely demand without dynamic EKG changes Received 1 round of defibrillations UDS + Cocaine -Not meeting criteria for TTM -Echocardiogram ordered -Trend troponin, lactic -Serial EKG  Sepsis with due to suspected Pneumonia  TRACHEAL ASPIRATE +GPC Lactic Acidosis Lactic: 1.5, Baseline PCT: 2.88>3.03>3.54, UA: pending, CXR: Progressive left basilar consolidation and left effusion. -Follow blood and Urine cultures, trend lactic/ PCT -Check strep pneumo antigen and Legionella -monitor WBC/ fever curve -Start broad coverage with Cefepime  &  vancomycin  -Currently not requiring vasopressors. 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 BNP >4500 Hypertension Urgency PMHx: Cardiomyopathy -Continuous cardiac monitoring -Maintain MAP greater than 65 -HD for fluid removal -Continue Coreg as bp permits. -Hold Losartan for now   ESRD on HD MWF Severe Metabolic Acidosis -Monitor I&O's / urinary output -Follow BMP -Continue Sodium bicarb gtt for now -Ensure adequate renal perfusion -Avoid nephrotoxic agents as able -Replace electrolytes as indicated -HD MWF -Nephrology following, input appreciated   Polysubstance Abuse UDS + Cocaine, Tobacco abuse -Nicotine patch -cessation counseling once able to participate   Best practice:  Diet:  Tube Feed  Pain/Anxiety/Delirium protocol (if indicated): Yes (RASS goal 0) VAP protocol (if indicated): Yes DVT prophylaxis: Subcutaneous Heparin GI prophylaxis: N/A Glucose control:  SSI No Central venous access:  Yes, and it is still needed Arterial line:  N/A Foley:  N/A Mobility:  bed rest  PT consulted: N/A Last date of multidisciplinary goals of care discussion [11/21] Code Status:  full code Disposition: ICU   = Goals of Care = Code Status Order: '@CODE' @   Primary Emergency Contact: [place], Home Phone: [phone] Wishes to pursue full aggressive treatment and intervention options, including CPR and intubation, but goals of care will be addressed on going with family if that should become necessary.  Critical care provider statement:   Total critical care time: 33 minutes   Performed by: Lanney Gins MD   Critical care time was exclusive of separately billable procedures and treating other patients.   Critical care was necessary to treat or prevent imminent or life-threatening deterioration.   Critical care was time spent personally by me on the  following activities: development of treatment plan with patient and/or surrogate as well as nursing, discussions with consultants, evaluation of patient's response to treatment, examination of patient, obtaining history from patient or surrogate, ordering and performing treatments and interventions, ordering and review of laboratory studies, ordering and review of radiographic studies, pulse oximetry and re-evaluation of patient's condition.    Ottie Glazier, M.D.  Pulmonary & Critical Care Medicine

## 2021-04-16 NOTE — TOC Progression Note (Signed)
Transition of Care [facility]) - Progression Note    Patient Details  Name: Larry Morrison MRN: [id number] Date of Birth: 1970-05-23  Transition [facility] CM/SW Contact  Shelbie Hutching, RN Phone Number: 04/16/2021, 1:14 PM  Clinical Narrative:    TOC continues to follow patient progression.  Sedation weaned today, bedside RN reports that patient is intermittently following commands.  Patient self extubated just now, respiratory and RN at the beside.     Expected Discharge Plan: Skilled Nursing Facility Barriers to Discharge: Continued Medical Work up  Expected Discharge Plan and Services Expected Discharge Plan: [place]   Discharge Planning Services: CM Consult   Living arrangements for the past 2 months: Apartment                 DME Arranged: N/A DME Agency: NA       HH Arranged: NA HH Agency: NA         Social Determinants of Health (SDOH) Interventions    Readmission Risk Interventions Readmission Risk Prevention Plan 04/12/2021 09/08/2020  Transportation Screening Complete Complete  Social Work Consult for [place] Planning/Counseling - Complete  Palliative Care Screening - Complete  Medication Review Press photographer) Complete Complete  PCP or Specialist appointment within 3-5 days of discharge Complete -  [place] or [place] Complete -  [place] or [place] Pt Refusal Comments has in home care aids -  SW Recovery Care/Counseling Consult Not Complete -  SW Consult Not Complete Comments pt intubated -  Palliative Care Screening Not Complete -  Comments intubated -  Skilled Nursing Facility Not Complete -  SNF Comments intubated -  Some recent data might be hidden

## 2021-04-16 NOTE — Progress Notes (Signed)
Met patient who was awake and watching me. Spoke with mother for an extended time. Provided presence and support to family bedside.

## 2021-04-16 NOTE — Progress Notes (Signed)
[facility]  ROUNDING NOTE   Subjective:   Patient still intubated at the moment. Is arousable while on the ventilator. Does appear to follow simple commands. Underwent dialysis yesterday.   Objective:  Vital signs in last 24 hours:  Temp:  [97.8 F (36.6 C)-98.2 F (36.8 C)] 97.9 F (36.6 C) (11/26 0750) Pulse Rate:  [59-86] 77 (11/26 1400) Resp:  [10-27] 27 (11/26 1400) BP: (79-147)/(51-97) 142/90 (11/26 1400) SpO2:  [91 %-100 %] 99 % (11/26 1400) FiO2 (%):  [24 %-30 %] 30 % (11/26 1036) Weight:  [51.3 kg] 51.3 kg (11/25 1728)  Weight change: 0 kg Filed Weights   04/13/21 1247 04/15/21 0134 04/15/21 1728  Weight: 49.1 kg 51.3 kg 51.3 kg    Intake/Output: I/O last 3 completed shifts: In: 1214.5 [I.V.:494.5; IV Piggyback:720] Out: -8 [Urine:105]   Intake/Output this shift:  No intake/output data recorded.  Physical Exam: General: Critically ill appearing  Head: ET tube in place  Lungs:  Ventilator assisted, decreased breath sounds at bases  Heart: Regular   Abdomen:  Soft, nontender, bowel sounds present  Extremities: no peripheral edema.  Neurologic: Opens eyes,  following simple commands  Skin: Warm   Access: Right IJ permcath  Right nephrostomy  Basic Metabolic Panel: Recent Labs  Lab 04/09/21 1653 04/10/21 0746 04/12/21 0540 04/13/21 0450 04/14/21 0515 04/15/21 0410 04/16/21 0352  NA 141   < > 139 137 133* 132* 135  K 4.9   < > 5.3* 4.6 3.8 4.2 4.0  CL 104   < > 104 104 98 98 97*  CO2 22   < > _0 GLUCOSE 86   < > 89 74 85 80 90  BUN 79*   < > 73* 72* 30* 41* 20  CREATININE 8.68*   < > 7.96* 7.71* 4.85* 5.27* 3.24*  CALCIUM 8.6*   < > 8.6* 8.0* 7.7* 7.4* 8.2*  MG 2.3  --  2.5*  --  1.7  --  1.8  PHOS 7.8*   < > 7.1* 7.1* 3.9 5.1* 3.4  3.5   < > = values in this interval not displayed.     Liver Function Tests: Recent Labs  Lab 04/11/21 1930 04/13/21 0450 04/14/21 0515 04/15/21 0410 04/16/21 0352  AST 26   --   --   --   --   ALT 15  --   --   --   --   ALKPHOS 105  --   --   --   --   BILITOT 1.3*  --   --   --   --   PROT 7.3  --   --   --   --   ALBUMIN 2.3* 2.1* 2.0* 1.8* 2.4*    No results for input(s): LIPASE, AMYLASE in the last 168 hours. No results for input(s): AMMONIA in the last 168 hours.  CBC: Recent Labs  Lab 04/11/21 1449 04/11/21 1930 04/12/21 0540 04/13/21 0450 04/14/21 0515 04/15/21 0410 04/16/21 0352  WBC 5.9   < > 6.5 5.2 5.6 4.4 5.2  NEUTROABS 4.2  --   --   --   --   --   --   HGB 10.0*   < > 9.0* 8.7* 8.8* 8.2* 8.5*  HCT 30.8*   < > 28.6* 27.2* 27.8* 26.7* 26.7*  MCV 96.3   < > 97.3 98.2 97.2 99.3 99.3  PLT 237   < > 187 174 161 141* 153   < > =  values in this interval not displayed.     Cardiac Enzymes: No results for input(s): CKTOTAL, CKMB, CKMBINDEX, TROPONINI in the last 168 hours.  BNP: Invalid input(s): POCBNP  CBG: Recent Labs  Lab 04/15/21 1920 04/15/21 2348 04/16/21 0732 04/16/21 0845 04/16/21 1134  GLUCAP 87 79 67* 133* 94     Microbiology: Results for orders placed or performed during the hospital encounter of 04/09/21  Resp Panel by RT-PCR (Flu A&B, Covid) Nasopharyngeal Swab     Status: None   Collection Time: 04/09/21  7:29 PM   Specimen: Nasopharyngeal Swab; Nasopharyngeal(NP) swabs in vial transport medium  Result Value Ref Range Status   SARS Coronavirus 2 by RT PCR NEGATIVE NEGATIVE Final    Comment: (NOTE) SARS-CoV-2 target nucleic acids are NOT DETECTED.  The SARS-CoV-2 RNA is generally detectable in upper respiratory specimens during the acute phase of infection. The lowest concentration of SARS-CoV-2 viral copies this assay can detect is 138 copies/mL. A negative result does not preclude SARS-Cov-2 infection and should not be used as the sole basis for treatment or other patient management decisions. A negative result may occur with  improper specimen collection/handling, submission of specimen other than  nasopharyngeal swab, presence of viral mutation(s) within the areas targeted by this assay, and inadequate number of viral copies(<138 copies/mL). A negative result must be combined with clinical observations, patient history, and epidemiological information. The expected result is Negative.  Fact Sheet for Patients:  EntrepreneurPulse.com.au  Fact Sheet for Healthcare Providers:  IncredibleEmployment.be  This test is no t yet approved or cleared by the [place] FDA and  has been authorized for detection and/or diagnosis of SARS-CoV-2 by FDA under an Emergency Use Authorization (EUA). This EUA will remain  in effect (meaning this test can be used) for the duration of the COVID-19 declaration under Section 564(b)(1) of the Act, 21 U.S.C.section 360bbb-3(b)(1), unless the authorization is terminated  or revoked sooner.       Influenza A by PCR NEGATIVE NEGATIVE Final   Influenza B by PCR NEGATIVE NEGATIVE Final    Comment: (NOTE) The Xpert Xpress SARS-CoV-2/FLU/RSV plus assay is intended as an aid in the diagnosis of influenza from Nasopharyngeal swab specimens and should not be used as a sole basis for treatment. Nasal washings and aspirates are unacceptable for Xpert Xpress SARS-CoV-2/FLU/RSV testing.  Fact Sheet for Patients: EntrepreneurPulse.com.au  Fact Sheet for Healthcare Providers: IncredibleEmployment.be  This test is not yet approved or cleared by the [place] FDA and has been authorized for detection and/or diagnosis of SARS-CoV-2 by FDA under an Emergency Use Authorization (EUA). This EUA will remain in effect (meaning this test can be used) for the duration of the COVID-19 declaration under Section 564(b)(1) of the Act, 21 U.S.C. section 360bbb-3(b)(1), unless the authorization is terminated or revoked.  Performed at [facility], [address] [place], [place]  [address]   MRSA Next Gen by PCR, Nasal     Status: Abnormal   Collection Time: 04/11/21 10:23 PM   Specimen: Nasal Mucosa; Nasal Swab  Result Value Ref Range Status   MRSA by PCR Next Gen DETECTED (A) NOT DETECTED Final    Comment: RESULT CALLED TO, READ BACK BY AND VERIFIED WITH: TESSYAMMA THOMAS _0  04/12/21 RH (NOTE) The GeneXpert MRSA Assay (FDA approved for NASAL specimens only), is one component of a comprehensive MRSA colonization surveillance program. It is not intended to diagnose MRSA infection nor to guide or monitor treatment for MRSA infections. Test performance is  not FDA approved in patients less than 30 years old. Performed at [facility], [address] [place], [place] [address]   Urine Culture     Status: Abnormal   Collection Time: 04/11/21 10:30 PM   Specimen: In/Out Cath Urine  Result Value Ref Range Status   Specimen Description   Final    IN/OUT CATH URINE Performed at [facility], [address] [place], [place] [address]    Special Requests   Final    NONE Performed at [facility], [address] [place], [place] [address]    Culture (A)  Final    >=100,000 COLONIES/mL METHICILLIN RESISTANT STAPHYLOCOCCUS AUREUS   Report Status 04/16/2021 FINAL  Final   Organism ID, Bacteria METHICILLIN RESISTANT STAPHYLOCOCCUS AUREUS (A)  Final      Susceptibility   Methicillin resistant staphylococcus aureus - MIC*    CIPROFLOXACIN >=8 RESISTANT Resistant     GENTAMICIN <=0.5 SENSITIVE Sensitive     NITROFURANTOIN <=16 SENSITIVE Sensitive     OXACILLIN >=4 RESISTANT Resistant     TETRACYCLINE <=1 SENSITIVE Sensitive     VANCOMYCIN 1 SENSITIVE Sensitive     TRIMETH/SULFA <=10 SENSITIVE Sensitive     CLINDAMYCIN >=8 RESISTANT Resistant     RIFAMPIN <=0.5 SENSITIVE Sensitive     Inducible Clindamycin NEGATIVE Sensitive     * >=100,000 COLONIES/mL METHICILLIN RESISTANT STAPHYLOCOCCUS AUREUS  CULTURE, BLOOD (ROUTINE X 2) w Reflex  to ID Panel     Status: None   Collection Time: 04/11/21 10:54 PM   Specimen: BLOOD LEFT HAND  Result Value Ref Range Status   Specimen Description BLOOD LEFT HAND  Final   Special Requests   Final    BOTTLES DRAWN AEROBIC ONLY Blood Culture adequate volume   Culture   Final    NO GROWTH 5 DAYS Performed at [facility], [address] [place], [place] [address]    Report Status 04/16/2021 FINAL  Final  CULTURE, BLOOD (ROUTINE X 2) w Reflex to ID Panel     Status: None   Collection Time: 04/11/21 11:05 PM   Specimen: BLOOD LEFT HAND  Result Value Ref Range Status   Specimen Description BLOOD LEFT HAND  Final   Special Requests   Final    BOTTLES DRAWN AEROBIC ONLY Blood Culture adequate volume   Culture   Final    NO GROWTH 5 DAYS Performed at [facility], [address], [place], [place] [address]    Report Status 04/16/2021 FINAL  Final  Culture, Respiratory w Gram Stain     Status: None   Collection Time: 04/12/21 12:31 AM   Specimen: Tracheal Aspirate; Respiratory  Result Value Ref Range Status   Specimen Description   Final    TRACHEAL ASPIRATE Performed at [facility], [address], [place], [place] [address]    Special Requests   Final    NONE Performed at [facility], [place], [place] [address]    Gram Stain   Final    FEW SQUAMOUS EPITHELIAL CELLS PRESENT FEW WBC SEEN FEW GRAM POSITIVE RODS MODERATE GRAM POSITIVE COCCI    Culture   Final    ABUNDANT Consistent with normal respiratory flora. No Pseudomonas species isolated Performed at [facility] [address], [place], [place] [address]    Report Status 04/14/2021 FINAL  Final    Coagulation Studies: No results for input(s): LABPROT, INR in the last 72 hours.  Urinalysis: No results for input(s): COLORURINE, LABSPEC, PHURINE, GLUCOSEU, HGBUR, BILIRUBINUR, KETONESUR, PROTEINUR, UROBILINOGEN, NITRITE, LEUKOCYTESUR in the last 72  hours.  Invalid input(s): APPERANCEUR     Imaging: DG Abd 1 View  Result Date: 04/16/2021 CLINICAL DATA:  Vomiting EXAM: ABDOMEN - 1 VIEW COMPARISON:  04/11/2021 FINDINGS: Bowel gas pattern is nonspecific. Radiograph is done in the left posterior oblique supine position limiting evaluation. There are surgical clips in the upper abdomen. There is a pigtail drainage catheter in the right upper quadrant. There is vascular catheter with its tip at L4 level in the course of inferior vena cava. Tip of enteric tube is seen in the stomach. IMPRESSION: Nonspecific bowel gas pattern. Electronically Signed   By: Elmer Picker M.D.   On: 04/16/2021 08:05   DG Chest Port 1 View  Result Date: 04/16/2021 CLINICAL DATA:  Vomiting, difficulty breathing EXAM: PORTABLE CHEST 1 VIEW COMPARISON:  Previous studies including the examination of 04/13/2021 FINDINGS: Tip of endotracheal tube is 5.1 cm above the carina. Enteric tube is noted in the esophagus. Tip of dialysis catheter is seen in the right atrium. Vascular stent is noted in the course of right subclavian vessels. Transverse diameter of heart is increased. Moderate to large left pleural effusion is present. Central pulmonary vessels are prominent. There is crowding of markings in the right lower lung fields. Evaluation of left mid and left lower lung fields for infiltrates is limited by the effusion. There is no pneumothorax. IMPRESSION: Cardiomegaly. Central pulmonary vessels are more prominent suggesting CHF. Moderate to large left pleural effusion with possible increase. Electronically Signed   By: Elmer Picker M.D.   On: 04/16/2021 08:03     Medications:    dextrose 10 mL/hr at 04/16/21 0442   doxycycline (VIBRAMYCIN) IV 100 mg (04/16/21 1120)   feeding supplement (VITAL AF 1.2 CAL) 1,000 mL (04/15/21 1303)   fentaNYL infusion INTRAVENOUS Stopped (04/16/21 1046)   norepinephrine (LEVOPHED) Adult infusion Stopped (04/13/21 0530)    piperacillin-tazobactam (ZOSYN)  IV 2.25 g (04/16/21 0647)    amLODipine  10 mg Oral Daily   vitamin C  500 mg Per Tube BID   carvedilol  3.125 mg Oral BID WC   chlorhexidine gluconate (MEDLINE KIT)  15 mL Mouth Rinse BID   Chlorhexidine Gluconate Cloth  6 each Topical Q0600   docusate  100 mg Per Tube BID   epoetin (EPOGEN/PROCRIT) injection  4,000 Units Intravenous Q M,W,F-HD   free water  30 mL Per Tube Q4H   heparin  5,000 Units Subcutaneous Q8H   mouth rinse  15 mL Mouth Rinse 10 times per day   multivitamin  1 tablet Oral QHS   pantoprazole (PROTONIX) IV  40 mg Intravenous Q24H   patiromer  16.8 g Oral Daily   polyethylene glycol  17 g Per Tube Daily   sodium bicarbonate  50 mEq Intravenous Once   fentaNYL (SUBLIMAZE) injection, midazolam, ondansetron **OR** ondansetron (ZOFRAN) IV  Assessment/ Plan:  Larry Morrison is a 50 y.o. 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   -Cardiac arrest/CPR during HD  on November 21.  Received about 2 L of fluid during resuscitation.  -Patient underwent hemodialysis treatment yesterday.  No acute indication for dialysis today.  We will plan for hemodialysis treatment again on Monday.   Anemia with chronic kidney disease:   Lab Results  Component Value Date   HGB 8.5 (L) 04/16/2021  Continue Epogen 4000 IV with dialysis.  Secondary Hyperparathyroidism: with hyperphosphatemia   Lab Results  Component Value Date   CALCIUM 8.2 (L) 04/16/2021   CAION 0.76  (LL) 08/07/2019   PHOS 3.4 04/16/2021   PHOS 3.5 04/16/2021    -Restart sevelamer with meals when able to eat.  Phosphorus currently separable at 3.5.  4. Acute resp failure Intubated post code Continue ventilatory support at this time and plans for weaning as per pulmonary/critical care.    LOS: 6 Larry Morrison 11/26/20222:52 PM

## 2021-04-16 NOTE — Progress Notes (Signed)
SBT conducted this AM. Fentanyl gtt stopped this am near 1040. Pt intermittently following simple commands, remained drowsy and producing increased/copious thick secretions.   Near 1320, pt self extubated. Pt placed on 2L [place]. Pt ox3, confused to time. Pt conversing some, remains drowsy; following commands.     Pt's mother and sister @ bedside, pt conversing w/ them.    Nephrostomy tube remains in place. 235 ml out.   Pt had 1x medium BM @ end of shift.

## 2021-04-17 LAB — RENAL FUNCTION PANEL
Albumin: 2.4 g/dL — ABNORMAL LOW (ref 3.5–5.0)
Anion gap: 10 (ref 5–15)
BUN: 35 mg/dL — ABNORMAL HIGH (ref 6–20)
CO2: 25 mmol/L (ref 22–32)
Calcium: 8.4 mg/dL — ABNORMAL LOW (ref 8.9–10.3)
Chloride: 96 mmol/L — ABNORMAL LOW (ref 98–111)
Creatinine, Ser: 4.33 mg/dL — ABNORMAL HIGH (ref 0.61–1.24)
GFR, Estimated: 16 mL/min — ABNORMAL LOW (ref >60)
Glucose, Bld: 81 mg/dL (ref 70–99)
Phosphorus: 3.7 mg/dL (ref 2.5–4.6)
Potassium: 4.1 mmol/L (ref 3.5–5.1)
Sodium: 131 mmol/L — ABNORMAL LOW (ref 135–145)

## 2021-04-17 LAB — GLUCOSE, CAPILLARY
Glucose-Capillary: 104 mg/dL — ABNORMAL HIGH (ref 70–99)
Glucose-Capillary: 164 mg/dL — ABNORMAL HIGH (ref 70–99)
Glucose-Capillary: 77 mg/dL (ref 70–99)
Glucose-Capillary: 80 mg/dL (ref 70–99)
Glucose-Capillary: 95 mg/dL (ref 70–99)

## 2021-04-17 LAB — CBC
HCT: 28.5 % — ABNORMAL LOW (ref 39.0–52.0)
Hemoglobin: 9.1 g/dL — ABNORMAL LOW (ref 13.0–17.0)
MCH: 31.2 pg (ref 26.0–34.0)
MCHC: 31.9 g/dL (ref 30.0–36.0)
MCV: 97.6 fL (ref 80.0–100.0)
Platelets: 187 10*3/uL (ref 150–400)
RBC: 2.92 MIL/uL — ABNORMAL LOW (ref 4.22–5.81)
RDW: 17.3 % — ABNORMAL HIGH (ref 11.5–15.5)
WBC: 7.9 10*3/uL (ref 4.0–10.5)
nRBC: 0 % (ref 0.0–0.2)

## 2021-04-17 LAB — MAGNESIUM: Magnesium: 1.9 mg/dL (ref 1.7–2.4)

## 2021-04-17 LAB — PHOSPHORUS: Phosphorus: 3.6 mg/dL (ref 2.5–4.6)

## 2021-04-17 NOTE — Progress Notes (Signed)
[facility]  ROUNDING NOTE   Subjective:   Patient now extubated. However he appears agitated today. Due for dialysis treatment again tomorrow.   Objective:  Vital signs in last 24 hours:  Temp:  [97.9 F (36.6 C)-98 F (36.7 C)] 98 F (36.7 C) (11/27 1100) Pulse Rate:  [72-105] 92 (11/27 1400) Resp:  [14-40] 38 (11/27 1400) BP: (136-182)/(72-113) 142/91 (11/27 1400) SpO2:  [85 %-99 %] 96 % (11/27 1400)  Weight change:  Filed Weights   04/13/21 1247 04/15/21 0134 04/15/21 1728  Weight: 49.1 kg 51.3 kg 51.3 kg    Intake/Output: I/O last 3 completed shifts: In: 249.2 [I.V.:199.2; IV Piggyback:50] Out: 472 [Urine:585]   Intake/Output this shift:  Total I/O In: 541.1 [I.V.:341.1; IV Piggyback:200] Out: -   Physical Exam: General: No acute distress  Head: Normocephalic, atraumatic, hearing intact  Lungs:  Scattered rhonchi, normal effort  Heart: Regular   Abdomen:  Soft, nontender, bowel sounds present  Extremities: no peripheral edema.  Neurologic: Awake, agitated  Skin: Warm   Access: Right IJ permcath  Right nephrostomy  Basic Metabolic Panel: Recent Labs  Lab 04/12/21 0540 04/13/21 0450 04/14/21 0515 04/15/21 0410 04/16/21 0352 04/17/21 0437  NA 139 137 133* 132* 135 131*  K 5.3* 4.6 3.8 4.2 4.0 4.1  CL 104 104 98 98 97* 96*  CO2 '23 22 27 25 29 25  ' GLUCOSE 89 74 85 80 90 81  BUN 73* 72* 30* 41* 20 35*  CREATININE 7.96* 7.71* 4.85* 5.27* 3.24* 4.33*  CALCIUM 8.6* 8.0* 7.7* 7.4* 8.2* 8.4*  MG 2.5*  --  1.7  --  1.8 1.9  PHOS 7.1* 7.1* 3.9 5.1* 3.4  3.5 3.7  3.6     Liver Function Tests: Recent Labs  Lab 04/11/21 1930 04/13/21 0450 04/14/21 0515 04/15/21 0410 04/16/21 0352 04/17/21 0437  AST 26  --   --   --   --   --   ALT 15  --   --   --   --   --   ALKPHOS 105  --   --   --   --   --   BILITOT 1.3*  --   --   --   --   --   PROT 7.3  --   --   --   --   --   ALBUMIN 2.3* 2.1* 2.0* 1.8* 2.4* 2.4*    No results for  input(s): LIPASE, AMYLASE in the last 168 hours. No results for input(s): AMMONIA in the last 168 hours.  CBC: Recent Labs  Lab 04/11/21 1449 04/11/21 1930 04/13/21 0450 04/14/21 0515 04/15/21 0410 04/16/21 0352 04/17/21 0437  WBC 5.9   < > 5.2 5.6 4.4 5.2 7.9  NEUTROABS 4.2  --   --   --   --   --   --   HGB 10.0*   < > 8.7* 8.8* 8.2* 8.5* 9.1*  HCT 30.8*   < > 27.2* 27.8* 26.7* 26.7* 28.5*  MCV 96.3   < > 98.2 97.2 99.3 99.3 97.6  PLT 237   < > 174 161 141* 153 187   < > = values in this interval not displayed.     Cardiac Enzymes: No results for input(s): CKTOTAL, CKMB, CKMBINDEX, TROPONINI in the last 168 hours.  BNP: Invalid input(s): POCBNP  CBG: Recent Labs  Lab 04/17/21 0006 04/17/21 0430 04/17/21 0813 04/17/21 1140 04/17/21 1503  GLUCAP 164* 77 80 104* 95  Microbiology: Results for orders placed or performed during the hospital encounter of 04/09/21  Resp Panel by RT-PCR (Flu A&B, Covid) Nasopharyngeal Swab     Status: None   Collection Time: 04/09/21  7:29 PM   Specimen: Nasopharyngeal Swab; Nasopharyngeal(NP) swabs in vial transport medium  Result Value Ref Range Status   SARS Coronavirus 2 by RT PCR NEGATIVE NEGATIVE Final    Comment: (NOTE) SARS-CoV-2 target nucleic acids are NOT DETECTED.  The SARS-CoV-2 RNA is generally detectable in upper respiratory specimens during the acute phase of infection. The lowest concentration of SARS-CoV-2 viral copies this assay can detect is 138 copies/mL. A negative result does not preclude SARS-Cov-2 infection and should not be used as the sole basis for treatment or other patient management decisions. A negative result may occur with  improper specimen collection/handling, submission of specimen other than nasopharyngeal swab, presence of viral mutation(s) within the areas targeted by this assay, and inadequate number of viral copies(<138 copies/mL). A negative result must be combined with clinical  observations, patient history, and epidemiological information. The expected result is Negative.  Fact Sheet for Patients:  EntrepreneurPulse.com.au  Fact Sheet for Healthcare Providers:  IncredibleEmployment.be  This test is no t yet approved or cleared by the [place] FDA and  has been authorized for detection and/or diagnosis of SARS-CoV-2 by FDA under an Emergency Use Authorization (EUA). This EUA will remain  in effect (meaning this test can be used) for the duration of the COVID-19 declaration under Section 564(b)(1) of the Act, 21 U.S.C.section 360bbb-3(b)(1), unless the authorization is terminated  or revoked sooner.       Influenza A by PCR NEGATIVE NEGATIVE Final   Influenza B by PCR NEGATIVE NEGATIVE Final    Comment: (NOTE) The Xpert Xpress SARS-CoV-2/FLU/RSV plus assay is intended as an aid in the diagnosis of influenza from Nasopharyngeal swab specimens and should not be used as a sole basis for treatment. Nasal washings and aspirates are unacceptable for Xpert Xpress SARS-CoV-2/FLU/RSV testing.  Fact Sheet for Patients: EntrepreneurPulse.com.au  Fact Sheet for Healthcare Providers: IncredibleEmployment.be  This test is not yet approved or cleared by the [place] FDA and has been authorized for detection and/or diagnosis of SARS-CoV-2 by FDA under an Emergency Use Authorization (EUA). This EUA will remain in effect (meaning this test can be used) for the duration of the COVID-19 declaration under Section 564(b)(1) of the Act, 21 U.S.C. section 360bbb-3(b)(1), unless the authorization is terminated or revoked.  Performed at [facility], [address] [place], [place] [address]   MRSA Next Gen by PCR, Nasal     Status: Abnormal   Collection Time: 04/11/21 10:23 PM   Specimen: Nasal Mucosa; Nasal Swab  Result Value Ref Range Status   MRSA by PCR Next Gen DETECTED  (A) NOT DETECTED Final    Comment: RESULT CALLED TO, READ BACK BY AND VERIFIED WITH: TESSYAMMA THOMAS '@0006'  04/12/21 RH (NOTE) The GeneXpert MRSA Assay (FDA approved for NASAL specimens only), is one component of a comprehensive MRSA colonization surveillance program. It is not intended to diagnose MRSA infection nor to guide or monitor treatment for MRSA infections. Test performance is not FDA approved in patients less than 62 years old. Performed at [facility], [address] [place], [place] [address]   Urine Culture     Status: Abnormal   Collection Time: 04/11/21 10:30 PM   Specimen: In/Out Cath Urine  Result Value Ref Range Status   Specimen Description  Final    IN/OUT CATH URINE Performed at [facility], [address] [place], [place] [address]    Special Requests   Final    NONE Performed at [facility], [address] [place], [place] [address]    Culture (A)  Final    >=100,000 COLONIES/mL METHICILLIN RESISTANT STAPHYLOCOCCUS AUREUS   Report Status 04/16/2021 FINAL  Final   Organism ID, Bacteria METHICILLIN RESISTANT STAPHYLOCOCCUS AUREUS (A)  Final      Susceptibility   Methicillin resistant staphylococcus aureus - MIC*    CIPROFLOXACIN >=8 RESISTANT Resistant     GENTAMICIN <=0.5 SENSITIVE Sensitive     NITROFURANTOIN <=16 SENSITIVE Sensitive     OXACILLIN >=4 RESISTANT Resistant     TETRACYCLINE <=1 SENSITIVE Sensitive     VANCOMYCIN 1 SENSITIVE Sensitive     TRIMETH/SULFA <=10 SENSITIVE Sensitive     CLINDAMYCIN >=8 RESISTANT Resistant     RIFAMPIN <=0.5 SENSITIVE Sensitive     Inducible Clindamycin NEGATIVE Sensitive     * >=100,000 COLONIES/mL METHICILLIN RESISTANT STAPHYLOCOCCUS AUREUS  CULTURE, BLOOD (ROUTINE X 2) w Reflex to ID Panel     Status: None   Collection Time: 04/11/21 10:54 PM   Specimen: BLOOD LEFT HAND  Result Value Ref Range Status   Specimen Description BLOOD LEFT HAND  Final   Special Requests    Final    BOTTLES DRAWN AEROBIC ONLY Blood Culture adequate volume   Culture   Final    NO GROWTH 5 DAYS Performed at [facility], [address] [place], [place] [address]    Report Status 04/16/2021 FINAL  Final  CULTURE, BLOOD (ROUTINE X 2) w Reflex to ID Panel     Status: None   Collection Time: 04/11/21 11:05 PM   Specimen: BLOOD LEFT HAND  Result Value Ref Range Status   Specimen Description BLOOD LEFT HAND  Final   Special Requests   Final    BOTTLES DRAWN AEROBIC ONLY Blood Culture adequate volume   Culture   Final    NO GROWTH 5 DAYS Performed at [facility], [address], [place], [place] [address]    Report Status 04/16/2021 FINAL  Final  Culture, Respiratory w Gram Stain     Status: None   Collection Time: 04/12/21 12:31 AM   Specimen: Tracheal Aspirate; Respiratory  Result Value Ref Range Status   Specimen Description   Final    TRACHEAL ASPIRATE Performed at [facility], [address] [place], [place] [address]    Special Requests   Final    NONE Performed at [facility], [place], [place] [address]    Gram Stain   Final    FEW SQUAMOUS EPITHELIAL CELLS PRESENT FEW WBC SEEN FEW GRAM POSITIVE RODS MODERATE GRAM POSITIVE COCCI    Culture   Final    ABUNDANT Consistent with normal respiratory flora. No Pseudomonas species isolated Performed at [facility] [address], [place],  [address]    Report Status 04/14/2021 FINAL  Final    Coagulation Studies: No results for input(s): LABPROT, INR in the last 72 hours.   Urinalysis: No results for input(s): COLORURINE, LABSPEC, PHURINE, GLUCOSEU, HGBUR, BILIRUBINUR, KETONESUR, PROTEINUR, UROBILINOGEN, NITRITE, LEUKOCYTESUR in the last 72 hours.  Invalid input(s): APPERANCEUR     Imaging: DG Abd 1 View  Result Date: 04/16/2021 CLINICAL DATA:  Vomiting EXAM: ABDOMEN - 1 VIEW COMPARISON:  04/11/2021 FINDINGS: Bowel gas pattern  is  nonspecific. Radiograph is done in the left posterior oblique supine position limiting evaluation. There are surgical clips in the upper abdomen. There is a pigtail drainage catheter in the right upper quadrant. There is vascular catheter with its tip at L4 level in the course of inferior vena cava. Tip of enteric tube is seen in the stomach. IMPRESSION: Nonspecific bowel gas pattern. Electronically Signed   By: Elmer Picker M.D.   On: 04/16/2021 08:05   DG Chest Port 1 View  Result Date: 04/16/2021 CLINICAL DATA:  Vomiting, difficulty breathing EXAM: PORTABLE CHEST 1 VIEW COMPARISON:  Previous studies including the examination of 04/13/2021 FINDINGS: Tip of endotracheal tube is 5.1 cm above the carina. Enteric tube is noted in the esophagus. Tip of dialysis catheter is seen in the right atrium. Vascular stent is noted in the course of right subclavian vessels. Transverse diameter of heart is increased. Moderate to large left pleural effusion is present. Central pulmonary vessels are prominent. There is crowding of markings in the right lower lung fields. Evaluation of left mid and left lower lung fields for infiltrates is limited by the effusion. There is no pneumothorax. IMPRESSION: Cardiomegaly. Central pulmonary vessels are more prominent suggesting CHF. Moderate to large left pleural effusion with possible increase. Electronically Signed   By: Elmer Picker M.D.   On: 04/16/2021 08:03     Medications:    dextrose 10 mL/hr at 04/17/21 1200   piperacillin-tazobactam (ZOSYN)  IV 2.25 g (04/17/21 1454)    amLODipine  10 mg Oral Daily   vitamin C  500 mg Per Tube BID   carvedilol  3.125 mg Oral BID WC   chlorhexidine gluconate (MEDLINE KIT)  15 mL Mouth Rinse BID   Chlorhexidine Gluconate Cloth  6 each Topical Q0600   epoetin (EPOGEN/PROCRIT) injection  4,000 Units Intravenous Q M,W,F-HD   heparin  5,000 Units Subcutaneous Q8H   mouth rinse  15 mL Mouth Rinse 10 times per day    multivitamin  1 tablet Oral QHS   pantoprazole (PROTONIX) IV  40 mg Intravenous Q24H   patiromer  16.8 g Oral Daily   hydrALAZINE, ondansetron **OR** ondansetron (ZOFRAN) IV  Assessment/ Plan:  [name] is a 50 y.o. 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   -Cardiac arrest/CPR during HD on November 21.  Received about 2 L of fluid during resuscitation.  -Patient had hemodialysis treatment on Friday.  We were planning for hemodialysis treatment again tomorrow.   Anemia with chronic kidney disease:   Lab Results  Component Value Date   HGB 9.1 (L) 04/17/2021  Continue Epogen 4000 IV with dialysis.  Continue to monitor CBC.  Secondary Hyperparathyroidism: with hyperphosphatemia   Lab Results  Component Value Date   CALCIUM 8.4 (L) 04/17/2021   CAION 0.76 (LL) 08/07/2019   PHOS 3.7 04/17/2021   PHOS 3.6 04/17/2021    -Phosphorus at target at 3.6.  Consider restarting Renvela once he is able to eat.  Patient now extubated and breathing comfortably.  4. Acute resp failure Intubated post code Continue ventilatory support at this time  and plans for weaning as per pulmonary/critical care.    LOS: 7 Larry Morrison 11/27/20223:30 PM

## 2021-04-17 NOTE — Progress Notes (Signed)
NAME:  Larry Morrison, MRN:  [id number], DOB:  1970/10/15, LOS: 7 ADMISSION DATE:  04/09/2021, CONSULTATION DATE:  04/11/21 REFERRING MD:  Berle Mull, MD  CHIEF COMPLAINT:  Cardiac Arrest   HPI  50 y.o with significant PMH of ESRD on HD who presented to the ED after an accidental fall at home due to falling asleep in his wheelchair. Came up to MICU post PEA arrest during HD intubated.      Events   11/19: Admitted to medsurg unit with volume overload due to missing HD. 11/20: Hyperkalemia with potassium of 6.2 getting [place].  Patient refusing dialysis today. 11/21: Patient went into PEA Cardiac arrest during HD session, intubated post ROSC and transferred to ICU. PCCM consulted 04/13/21- patient for SBT today to liberate from MV. Plan for HD today as well.  04/14/21- patient failed SBT with tachypnea and high volume inspissated respiratory secretions.  He is improving on antibiotics and dialysis.  04/15/21- patient failed SBT due to high secretions, he received glycopyrrolate but failed SBT again high secretions. 04/16/21- patient has improved will try to liberate from mechanical ventilator today.  04/17/21- patient is being optimized for TRH transfer today.    Past Medical History   Cardiomyopathy    Protein-calorie malnutrition, severe 03/01/2021  Arteriovenous fistula occlusion (HCC)    Pleural effusion 02/28/2021  Arteriovenous fistula thrombosis (HCC) 64/40/3474  Metabolic acidosis 25/95/6387  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 02/28/2021  History of traumatic brain injury 09/07/2020  Cognitive impairment 56/43/3295  Metabolic encephalopathy 18/84/1660  ESRD needing dialysis ([place]) 09/04/2020  Hypoglycemia 09/04/2020  Uremia 09/04/2020  COVID-19 virus infection 06/18/2020  Stroke (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 11/21: Abdominal xray>Enteric catheter tip and side port projecting over the gastric body. Bowel gas pattern is unremarkable. Persistent left basilar consolidation and effusion. 11/21: Noncontrast CT head> 11/19: Chest Xray>Cardiomegaly. Increased homogeneous opacity is seen in the left mid and left lower lung fields suggesting increase in left pleural effusion and possibly underlying atelectasis/pneumonia. Central pulmonary vessels are slightly less prominent. There are no signs of alveolar pulmonary edema in the right lung.    Micro Data:  11/19: SARS-CoV-2 PCR> negative 11/19: Influenza PCR> negative 11/21: Blood culture x2> 11/21: Urine Culture> 11/21: Tracheal aspirate> 11/21: MRSA PCR>>  11/21: Strep pneumo urinary antigen> 11/21: Legionella urinary antigen>  Antimicrobials:  Vancomycin 11/21> Cefepime 11/21>  OBJECTIVE  Blood pressure (!) 163/94, pulse 94, temperature 97.9 F (36.6 C), resp. rate 14, height 5' 2.01" (1.575 m), weight 51.3 kg, SpO2 94 %.        Intake/Output Summary (Last 24 hours) at 04/17/2021 1109 Last data filed at 04/17/2021 0615 Gross per 24 hour  Intake --  Output 585 ml  Net -585 ml    Filed Weights   04/13/21  1247 04/15/21 0134  04/15/21 1728  Weight: 49.1 kg 51.3 kg 51.3 kg   Physical Examination  GENERAL: 50 year-old critically ill patient lying i EYES: Pupils are equal but patient has ocular dysfunction at baseline .No scleral icterus. Extraocular muscles intact.  HEENT: Head atraumatic, normocephalic. Oropharynx and nasopharynx clear.  NECK:  Supple, no jugular venous distention. No thyroid enlargement, no tenderness.  LUNGS: Decreased breath sounds bilaterally, no wheezing, rales, moderate rhonchi. No crepitation. No use of accessory muscles of respiration.  CARDIOVASCULAR: S1, S2 normal. No murmurs, rubs, or gallops.  ABDOMEN: Soft, nontender, nondistended. Bowel sounds present. No organomegaly or mass.  Nephrostomy tube in place draining yellow urine EXTREMITIES: No pedal edema, cyanosis, or clubbing.  NEUROLOGIC: Cranial nerves II through XII are intact.  Muscle strength NOT checked. Sensation intact. Gait not checked.  PSYCHIATRIC: The patient is intubated and sedated SKIN: No obvious rash, lesion, or ulcer.   Labs/imaging that I havepersonally reviewed  (right click and "Reselect all SmartList Selections" daily)     Labs   CBC: Recent Labs  Lab 04/11/21 1449 04/11/21 1930 04/13/21 0450 04/14/21 0515 04/15/21 0410 04/16/21 0352 04/17/21 0437  WBC 5.9   < > 5.2 5.6 4.4 5.2 7.9  NEUTROABS 4.2  --   --   --   --   --   --   HGB 10.0*   < > 8.7* 8.8* 8.2* 8.5* 9.1*  HCT 30.8*   < > 27.2* 27.8* 26.7* 26.7* 28.5*  MCV 96.3   < > 98.2 97.2 99.3 99.3 97.6  PLT 237   < > 174 161 141* 153 187   < > = values in this interval not displayed.     Basic Metabolic Panel: Recent Labs  Lab 04/12/21 0540 04/13/21 0450 04/14/21 0515 04/15/21 0410 04/16/21 0352 04/17/21 0437  NA 139 137 133* 132* 135 131*  K 5.3* 4.6 3.8 4.2 4.0 4.1  CL 104 104 98 98 97* 96*  CO2 '23 22 27 25 29 25  ' GLUCOSE 89 74 85 80 90 81  BUN 73* 72* 30* 41* 20 35*  CREATININE 7.96* 7.71* 4.85* 5.27* 3.24* 4.33*  CALCIUM 8.6*  8.0* 7.7* 7.4* 8.2* 8.4*  MG 2.5*  --  1.7  --  1.8 1.9  PHOS 7.1* 7.1* 3.9 5.1* 3.4  3.5 3.7  3.6    GFR: Estimated Creatinine Clearance: 14.5 mL/min (A) (by C-G formula based on SCr of 4.33 mg/dL (H)). Recent Labs  Lab 04/11/21 0347 04/11/21 1449 04/11/21 2205 04/12/21 0540 04/14/21 0515 04/15/21 0410 04/16/21 0352 04/17/21 0437  PROCALCITON 3.54  --   --   --   --   --   --   --   WBC 6.4   < >  --    < > 5.6 4.4 5.2 7.9  LATICACIDVEN  --   --  1.5  --   --   --   --   --    < > = values in this interval not displayed.     Liver Function Tests: Recent Labs  Lab 04/11/21 1930 04/13/21 0450 04/14/21 0515 04/15/21 0410 04/16/21 0352 04/17/21 0437  AST 26  --   --   --   --   --   ALT 15  --   --   --   --   --   ALKPHOS 105  --   --   --   --   --   BILITOT 1.3*  --   --   --   --   --  PROT 7.3  --   --   --   --   --   ALBUMIN 2.3* 2.1* 2.0* 1.8* 2.4* 2.4*    No results for input(s): LIPASE, AMYLASE in the last 168 hours. No results for input(s): AMMONIA in the last 168 hours.  ABG    Component Value Date/Time   PHART 7.40 04/13/2021 0530   PCO2ART 34 04/13/2021 0530   PO2ART 131 (H) 04/13/2021 0530   HCO3 21.1 04/13/2021 0530   TCO2 27 08/07/2019 1018   ACIDBASEDEF 3.2 (H) 04/13/2021 0530   O2SAT 99.0 04/13/2021 0530      Coagulation Profile: Recent Labs  Lab 04/11/21 1930  INR 1.5*     Cardiac Enzymes: No results for input(s): CKTOTAL, CKMB, CKMBINDEX, TROPONINI in the last 168 hours.  HbA1C: Hgb A1c MFr Bld  Date/Time Value Ref Range Status  09/05/2020 05:00 AM 5.0 4.8 - 5.6 % Final    Comment:    (NOTE)         Prediabetes: 5.7 - 6.4         Diabetes: >6.4         Glycemic control for adults with diabetes: <7.0     CBG: Recent Labs  Lab 04/16/21 2058 04/16/21 2337 04/17/21 0006 04/17/21 0430 04/17/21 0813  GLUCAP 85 68* 164* 72 80     Review of Systems:   UNABLE TO ASSESS PATIENT IS INTUBATED AND SEDATED  Past  Medical History  He,  has a past medical history of Anemia, Aneurysm ([place]), Dialysis patient ([place]), Dyspnea, Foot drop, History of kidney stones, History of nephrostomy, Hypertension, Renal disorder, and Stroke ([place]).   Surgical History    Past Surgical History:  Procedure Laterality Date   A/V SHUNT INTERVENTION N/A 03/01/2021   Procedure: A/V SHUNT INTERVENTION;  Surgeon: Katha Cabal, MD;  Location: [facility];  Service: Cardiovascular;  Laterality: N/A;   AV FISTULA PLACEMENT Right 08/07/2019   Procedure: ARTERIOVENOUS (AV) FISTULA CREATION;  Surgeon: Algernon Huxley, MD;  Location: [facility];  Service: Vascular;  Laterality: Right;   AV FISTULA PLACEMENT Right 11/20/2019   Procedure: INSERTION OF ARTERIOVENOUS (AV) GORE-TEX GRAFT ARM;  Surgeon: Algernon Huxley, MD;  Location: [facility];  Service: Vascular;  Laterality: Right;   IR NEPHROSTOMY EXCHANGE RIGHT  09/10/2020   NEPHRECTOMY Left    NEPHRECTOMY     ORCHIECTOMY Right 10/27/2016   Procedure: PSB ORCHIECTOMY;  Surgeon: Hollice Espy, MD;  Location: [facility];  Service: Urology;  Laterality: Right;   ORCHIOPEXY Bilateral 10/27/2016   Procedure: ORCHIOPEXY ADULT;  Surgeon: Hollice Espy, MD;  Location: [facility];  Service: Urology;  Laterality: Bilateral;   SCROTAL EXPLORATION Bilateral 10/27/2016   Procedure: SCROTUM EXPLORATION;  Surgeon: Hollice Espy, MD;  Location: [facility];  Service: Urology;  Laterality: Bilateral;     Social History   reports that he has quit smoking. His smoking use included cigarettes. He smoked an average of .5 packs per day. He has never used smokeless tobacco. He reports current drug use. Drug: Marijuana. He reports that he does not drink alcohol.   Family History   His Family history is unknown by patient.   Allergies Allergies  Allergen Reactions   Vancomycin     Patient denies - repeated denial to pharm tech 09-05-2020     Home Medications  Prior to Admission medications   Medication  Sig Start Date End Date Taking? Authorizing Provider  aspirin EC 81 MG EC tablet Take  1 tablet (81 mg total) by mouth daily. Swallow whole. 03/04/21   Enzo Bi, MD  carvedilol (COREG) 3.125 MG tablet Take 1 tablet (3.125 mg total) by mouth 2 (two) times daily with a meal. Patient not taking: Reported on 04/09/2021 03/03/21 06/01/21  Enzo Bi, MD  feeding supplement (ENSURE ENLIVE / ENSURE PLUS) LIQD Take 237 mLs by mouth daily. Patient not taking: Reported on 04/09/2021 03/03/21   Enzo Bi, MD  losartan (COZAAR) 25 MG tablet Take 1 tablet (25 mg total) by mouth daily. Patient not taking: Reported on 04/09/2021 03/04/21 06/02/21  Enzo Bi, MD  multivitamin (RENA-VIT) TABS tablet Take 1 tablet by mouth at bedtime. Patient not taking: Reported on 04/09/2021 03/03/21   Enzo Bi, MD  sevelamer carbonate (RENVELA) 800 MG tablet Take 1 tablet (800 mg total) by mouth 3 (three) times daily with meals. Patient not taking: Reported on 04/09/2021 03/03/21 06/01/21  Enzo Bi, MD    Scheduled Meds:  amLODipine  10 mg Oral Daily   vitamin C  500 mg Per Tube BID   carvedilol  3.125 mg Oral BID WC   chlorhexidine gluconate (MEDLINE KIT)  15 mL Mouth Rinse BID   Chlorhexidine Gluconate Cloth  6 each Topical Q0600   epoetin (EPOGEN/PROCRIT) injection  4,000 Units Intravenous Q M,W,F-HD   heparin  5,000 Units Subcutaneous Q8H   mouth rinse  15 mL Mouth Rinse 10 times per day   multivitamin  1 tablet Oral QHS   pantoprazole (PROTONIX) IV  40 mg Intravenous Q24H   patiromer  16.8 g Oral Daily   sodium bicarbonate  50 mEq Intravenous Once   Continuous Infusions:  dextrose 20 mL/hr at 04/17/21 0007   norepinephrine (LEVOPHED) Adult infusion Stopped (04/13/21 0530)   piperacillin-tazobactam (ZOSYN)  IV 2.25 g (04/17/21 0658)   PRN Meds:.hydrALAZINE, ondansetron **OR** ondansetron (ZOFRAN) IV   Active Hospital Problem list   Acute hypoxic respiratory failure Sepsis   Assessment & Plan:  Acute  Hypoxic Respiratory Failure in the setting of Pulmonary Edema post ACLS and missed HD-RESOLVED  -Continue ventilator support & lung protective strategies -Wean PEEP & FiO2 as tolerated, maintain SpO2 > 90% -Head of bed elevated 30 degrees, VAP protocol in place -Plateau pressures less than 30 cm H20  -Intermittent chest x-ray & ABG PRN -Daily WUA with SBT as tolerated  -Ensure adequate pulmonary hygiene  -HD for fluid removal -Budesonide inhaler nebs BID, bronchodilators PRN -PAD protocol in place: continue propofol gtt           -SBT with plan for extubation today  PEA [place]-RESOLVED Suspect in the setting of respiratory arrest from severe metabolic disorder QTc prolongation Elevated Troponin likely demand without dynamic EKG changes Received 1 round of defibrillations UDS + Cocaine -Not meeting criteria for TTM -Echocardiogram ordered -Trend troponin, lactic -Serial EKG  Sepsis with due to suspected Pneumonia  TRACHEAL ASPIRATE +GPC Lactic Acidosis Lactic: 1.5, Baseline PCT: 2.88>3.03>3.54, UA: pending, CXR: Progressive left basilar consolidation and left effusion. -Follow blood and Urine cultures, trend lactic/ PCT -Check strep pneumo antigen and Legionella -monitor WBC/ fever curve -Start broad coverage with Cefepime  & vancomycin  -Currently not requiring vasopressors. 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 BNP >4500 Hypertension Urgency PMHx: Cardiomyopathy -Continuous cardiac monitoring -Maintain MAP greater than 65 -HD for fluid removal -Continue Coreg as bp permits. -Hold Losartan for now  ESRD on HD MWF Severe Metabolic Acidosis -Monitor I&O's / urinary output -Follow BMP -Continue Sodium bicarb gtt for now -Ensure adequate renal perfusion -Avoid nephrotoxic agents as able -Replace electrolytes as indicated -HD MWF -Nephrology  following, input appreciated   Polysubstance Abuse UDS + Cocaine, Tobacco abuse -Nicotine patch -cessation counseling once able to participate   Best practice:  Diet:  Tube Feed  Pain/Anxiety/Delirium protocol (if indicated): Yes (RASS goal 0) VAP protocol (if indicated): Yes DVT prophylaxis: Subcutaneous Heparin GI prophylaxis: N/A Glucose control:  SSI No Central venous access:  Yes, and it is still needed Arterial line:  N/A Foley:  N/A Mobility:  bed rest  PT consulted: N/A Last date of multidisciplinary goals of care discussion [11/21] Code Status:  full code Disposition: ICU   = Goals of Care = Code Status Order: '@CODE' @   Primary Emergency Contact: [place], Home Phone: [phone] Wishes to pursue full aggressive treatment and intervention options, including CPR and intubation, but goals of care will be addressed on going with family if that should become necessary.  Critical care provider statement:   Total critical care time: 33 minutes   Performed by: Lanney Gins MD   Critical care time was exclusive of separately billable procedures and treating other patients.   Critical care was necessary to treat or prevent imminent or life-threatening deterioration.   Critical care was time spent personally by me on the following activities: development of treatment plan with patient and/or surrogate as well as nursing, discussions with consultants, evaluation of patient's response to treatment, examination of patient, obtaining history from patient or surrogate, ordering and performing treatments and interventions, ordering and review of laboratory studies, ordering and review of radiographic studies, pulse oximetry and re-evaluation of patient's condition.    Ottie Glazier, M.D.  Pulmonary & Critical Care Medicine

## 2021-04-17 NOTE — Evaluation (Addendum)
Clinical/Bedside Swallow Evaluation Patient Details  Name: Larry Morrison MRN: [id number] Date of Birth: November 09, 1970  Today's Date: 04/17/2021 Time: SLP Start Time (ACUTE ONLY): 0940 SLP Stop Time (ACUTE ONLY): 1000 SLP Time Calculation (min) (ACUTE ONLY): 20 min  Past Medical History:  Past Medical History:  Diagnosis Date   Anemia    Aneurysm ([place])    brain at age 50   Dialysis patient [facility])    Dyspnea    Foot drop    History of kidney stones    History of nephrostomy    Hypertension    Renal disorder    Stroke [facility])    Past Surgical History:  Past Surgical History:  Procedure Laterality Date   A/V SHUNT INTERVENTION N/A 03/01/2021   Procedure: A/V SHUNT INTERVENTION;  Surgeon: Katha Cabal, MD;  Location: [place] CV LAB;  Service: Cardiovascular;  Laterality: N/A;   AV FISTULA PLACEMENT Right 08/07/2019   Procedure: ARTERIOVENOUS (AV) FISTULA CREATION;  Surgeon: Algernon Huxley, MD;  Location: [facility] ORS;  Service: Vascular;  Laterality: Right;   AV FISTULA PLACEMENT Right 11/20/2019   Procedure: INSERTION OF ARTERIOVENOUS (AV) GORE-TEX GRAFT ARM;  Surgeon: Algernon Huxley, MD;  Location: [facility] ORS;  Service: Vascular;  Laterality: Right;   IR NEPHROSTOMY EXCHANGE RIGHT  09/10/2020   NEPHRECTOMY Left    NEPHRECTOMY     ORCHIECTOMY Right 10/27/2016   Procedure: PSB ORCHIECTOMY;  Surgeon: Hollice Espy, MD;  Location: [facility] ORS;  Service: Urology;  Laterality: Right;   ORCHIOPEXY Bilateral 10/27/2016   Procedure: ORCHIOPEXY ADULT;  Surgeon: Hollice Espy, MD;  Location: [facility] ORS;  Service: Urology;  Laterality: Bilateral;   SCROTAL EXPLORATION Bilateral 10/27/2016   Procedure: SCROTUM EXPLORATION;  Surgeon: Hollice Espy, MD;  Location: [facility] ORS;  Service: Urology;  Laterality: Bilateral;   HPI:  Per 94 H&P 04/09/21 Larry Morrison is a 50 y.o. male with medical history significant for end-stage renal disease on hemodialysis, frequent noncompliance with hemodialysis,  hypertension, severe protein calorie malnutrition, elevated troponin, history of pleural effusion, anemia due to chronic kidney disease, tobacco use disorder, history of CVA, history of TBI, who presents emergency department for chief concerns of shortness of breath.     He reports that shortness of breath started on a.m. on day of admission.  He reports this feels similar to previous episodes of missing dialysis session.  He states that he would never miss dialysis sessions again.  He states he missed it because he had to see his mother yesterday.     Bedside shortness of breath with exertion and laying flat, he denies any chest pain, double vision, headaches, abdominal pain, syncope, loss of consciousness, changes to his lower extremity swelling.    He does endorse diarrhea that has been ongoing for the last 2 to 3 days.  He denies any recent antibiotic use and/or known sick contacts.     At bedside he is able to tell me his name, age, current location of hospital.  When his dinner plate arrived, and he saw salad, he explained loudly how this please he is that he has received a salad and that there is broccoli on his plate of chicken and rice.       I encouraged him to eat his vegetables as this is healthy for him." Pt with PEA cardiac arrest on 11/21. Intubated 11/21-11/27 (self-extubated).   CXR 04/16/21 "Cardiomegaly. Central pulmonary vessels are more prominent suggesting CHF. Moderate to large left pleural effusion with possible increase." Head  CT 04/12/21 "1. No acute intracranial pathology.2. Postsurgical changes with large area of old infarct and encephalomalacia involving the left posterior parietal and occipital lobes."   Assessment / Plan / Recommendation  Clinical Impression  Pt seen for clinical swallowing evaluation. Pt alert. Flat affect. Limited cooperation. Pt HOB ~60 degrees as pt refused further repositioning attempts despite cueing. Pt on 2L/min O2 via [place]. Inconsistently following  commands during evaluation. Slow verbal responses. Cleared with RN.   Pt with limited cooperation with oral motor exam. Noted pt with several missing teeth and overall poor dental hygiene. No facial asymmetry noted at rest.   Based on today's evaluation, pt present with s/sx moderate oral dysphagia and highly suspected pharyngeal dysphagia in setting of recent/proloning intubation following PEA cardiac arrest on 04/11/21. Oral phase notable for oral holding across trials and anterior loss of thin liquids via cup sip. Pt with immediate cough response, often prolonged, with trials of thin liquids (via tsp and cup), nectar-thick liquids (via tsp), and puree (via tsp). Solid deferred due to severity of clinical presentation. No overt s/sx pharyngeal dysphagia noted with trials of ice chips.   Pt is at increased risk for aspiration/aspiration PNA given recent/prolonged intubation, dental status, cognitive deficits, hx CVA, hx TBI, and multiple medical comorbidities.   At present, a safe oral diet cannot be recommended. Recommend NPO x ice chips in moderation following oral care. Recommend consideration for non-oral means of nutrition/hydration/medication. SLP to f/u next date for clinical swallowing re-evaluation. Pt is at risk for inadequate nutrition/hydration given NPO status.  Pt and RN made aware of results including clinical s/sx and risk factors, recommendations (including NPO x ice chips status), and SLP POC. ?full understanding by pt. RN verbalized understanding/agreement.  SLP to f/u next date for clinical swallowing re-evaluation.   SLP Visit Diagnosis: Dysphagia, oropharyngeal phase (R13.12)    Aspiration Risk  Moderate aspiration risk;Risk for inadequate nutrition/hydration    Diet Recommendation NPO;Ice chips PRN after oral care   Medication Administration: Via alternative means    Other  Recommendations Oral Care Recommendations: Oral care QID;Oral care prior to ice  chip/H20;Staff/trained caregiver to provide oral care Other Recommendations: Remove water pitcher    Recommendations for follow up therapy are one component of a multi-disciplinary discharge planning process, led by the attending physician.  Recommendations may be updated based on patient status, additional functional criteria and insurance authorization.  Follow up Recommendations Skilled nursing-short term rehab (<3 hours/day)      Assistance Recommended at Discharge Frequent or constant Supervision/Assistance  Functional Status Assessment Patient has had a recent decline in their functional status and demonstrates the ability to make significant improvements in function in a reasonable and predictable amount of time.  Frequency and Duration min 2x/week  2 weeks       Prognosis Prognosis for Safe Diet Advancement: Fair Barriers to Reach Goals: Cognitive deficits;Motivation;Severity of deficits;Behavior      Swallow Study   General Date of Onset: 04/09/21 HPI: Per 40 H&P 04/09/21 Larry Morrison is a 50 y.o. male with medical history significant for end-stage renal disease on hemodialysis, frequent noncompliance with hemodialysis, hypertension, severe protein calorie malnutrition, elevated troponin, history of pleural effusion, anemia due to chronic kidney disease, tobacco use disorder, history of CVA, history of TBI, who presents emergency department for chief concerns of shortness of breath.     He reports that shortness of breath started on a.m. on day of admission.  He reports this feels similar to previous episodes of  missing dialysis session.  He states that he would never miss dialysis sessions again.  He states he missed it because he had to see his mother yesterday.     Bedside shortness of breath with exertion and laying flat, he denies any chest pain, double vision, headaches, abdominal pain, syncope, loss of consciousness, changes to his lower extremity swelling.    He does  endorse diarrhea that has been ongoing for the last 2 to 3 days.  He denies any recent antibiotic use and/or known sick contacts.     At bedside he is able to tell me his name, age, current location of hospital.  When his dinner plate arrived, and he saw salad, he explained loudly how this please he is that he has received a salad and that there is broccoli on his plate of chicken and rice.       I encouraged him to eat his vegetables as this is healthy for him." Pt with PEA cardiac arrest on 11/21. Intubated 11/21-11/27 (self-extubated). Type of Study: Bedside Swallow Evaluation Previous Swallow Assessment: unknown Diet Prior to this Study:  (NPO) Respiratory Status: Nasal cannula (2L/min) History of Recent Intubation: Yes Length of Intubations (days): 6 days Date extubated: 04/16/21 Behavior/Cognition: Confused;Agitated;Uncooperative;Requires cueing Oral Cavity Assessment: Within Functional Limits Oral Care Completed by SLP: Yes Oral Cavity - Dentition: Poor condition;Missing dentition Vision:  (pt would not attempt self-feeding) Patient Positioning: Partially reclined (per pt preference; would not allow SLP to reposition) Baseline Vocal Quality: Normal Volitional Cough: Cognitively unable to elicit Volitional Swallow: Unable to elicit    Oral/Motor/Sensory Function Overall Oral Motor/Sensory Function: Within functional limits (no obvious deficits)   Ice Chips Ice chips: Within functional limits Presentation: Spoon Other Comments: x3   Thin Liquid Thin Liquid: Impaired Presentation: Spoon;Cup Oral Phase Impairments: Reduced labial seal;Poor awareness of bolus Oral Phase Functional Implications: Right anterior spillage;Oral holding Pharyngeal  Phase Impairments: Cough - Immediate Other Comments: ~2 oz total    Nectar Thick Nectar Thick Liquid: Impaired Presentation: Spoon Oral Phase Impairments: Poor awareness of bolus Oral phase functional implications: Oral holding Pharyngeal  Phase Impairments: Cough - Immediate Other Comments: x2 tsp   Honey Thick Honey Thick Liquid: Not tested   Puree Puree: Impaired Presentation: Spoon Oral Phase Impairments: Poor awareness of bolus Oral Phase Functional Implications: Oral holding Pharyngeal Phase Impairments: Cough - Immediate Other Comments: x3 tsp   Solid   Cherrie Gauze, M.S., [facility] [phone] (ASCOM)   Solid: Not tested      Clearnce Sorrel Disney Ruggiero 04/17/2021,11:25 AM

## 2021-04-18 DIAGNOSIS — I5043 Acute on chronic combined systolic (congestive) and diastolic (congestive) heart failure: Secondary | ICD-10-CM | POA: Diagnosis not present

## 2021-04-18 DIAGNOSIS — J9601 Acute respiratory failure with hypoxia: Secondary | ICD-10-CM | POA: Diagnosis not present

## 2021-04-18 DIAGNOSIS — J90 Pleural effusion, not elsewhere classified: Secondary | ICD-10-CM

## 2021-04-18 DIAGNOSIS — N186 End stage renal disease: Secondary | ICD-10-CM | POA: Diagnosis not present

## 2021-04-18 DIAGNOSIS — I255 Ischemic cardiomyopathy: Secondary | ICD-10-CM

## 2021-04-18 DIAGNOSIS — R0602 Shortness of breath: Secondary | ICD-10-CM

## 2021-04-18 LAB — CBC
HCT: 27.6 % — ABNORMAL LOW (ref 39.0–52.0)
Hemoglobin: 8.8 g/dL — ABNORMAL LOW (ref 13.0–17.0)
MCH: 30.7 pg (ref 26.0–34.0)
MCHC: 31.9 g/dL (ref 30.0–36.0)
MCV: 96.2 fL (ref 80.0–100.0)
Platelets: 206 10*3/uL (ref 150–400)
RBC: 2.87 MIL/uL — ABNORMAL LOW (ref 4.22–5.81)
RDW: 17.6 % — ABNORMAL HIGH (ref 11.5–15.5)
WBC: 7.5 10*3/uL (ref 4.0–10.5)
nRBC: 0 % (ref 0.0–0.2)

## 2021-04-18 LAB — BASIC METABOLIC PANEL
Anion gap: 13 (ref 5–15)
BUN: 42 mg/dL — ABNORMAL HIGH (ref 6–20)
CO2: 21 mmol/L — ABNORMAL LOW (ref 22–32)
Calcium: 8.8 mg/dL — ABNORMAL LOW (ref 8.9–10.3)
Chloride: 102 mmol/L (ref 98–111)
Creatinine, Ser: 5.62 mg/dL — ABNORMAL HIGH (ref 0.61–1.24)
GFR, Estimated: 11 mL/min — ABNORMAL LOW (ref >60)
Glucose, Bld: 90 mg/dL (ref 70–99)
Potassium: 4.2 mmol/L (ref 3.5–5.1)
Sodium: 136 mmol/L (ref 135–145)

## 2021-04-18 LAB — GLUCOSE, CAPILLARY
Glucose-Capillary: 87 mg/dL (ref 70–99)
Glucose-Capillary: 88 mg/dL (ref 70–99)
Glucose-Capillary: 101 mg/dL — ABNORMAL HIGH (ref 70–99)

## 2021-04-18 LAB — PHOSPHORUS: Phosphorus: 4.3 mg/dL (ref 2.5–4.6)

## 2021-04-18 LAB — MAGNESIUM: Magnesium: 2 mg/dL (ref 1.7–2.4)

## 2021-04-18 MED ORDER — HEPARIN SODIUM (PORCINE) 1000 UNIT/ML IJ SOLN
INTRAMUSCULAR | Status: AC
  Administered 2021-04-18: 10000 [IU]
  Filled 2021-04-18: qty 10

## 2021-04-18 MED ORDER — BOOST / RESOURCE BREEZE PO LIQD CUSTOM
1.0000 | Freq: Three times a day (TID) | ORAL | Status: DC
  Administered 2021-04-18 – 2021-04-20 (×2): 1 via ORAL

## 2021-04-18 MED ORDER — CARVEDILOL 6.25 MG PO TABS
6.2500 mg | ORAL_TABLET | Freq: Two times a day (BID) | ORAL | Status: DC
  Filled 2021-04-18 (×4): qty 1

## 2021-04-18 MED ORDER — EPOETIN ALFA 4000 UNIT/ML IJ SOLN
INTRAMUSCULAR | Status: AC
  Administered 2021-04-18: 4000 [IU] via INTRAVENOUS
  Filled 2021-04-18: qty 1

## 2021-04-18 MED ORDER — PROSOURCE PLUS PO LIQD
30.0000 mL | Freq: Three times a day (TID) | ORAL | Status: DC
  Administered 2021-04-18 – 2021-04-20 (×3): 30 mL via ORAL
  Filled 2021-04-18 (×10): qty 30

## 2021-04-18 NOTE — Progress Notes (Signed)
Patient extremely agitated through the night. Patient refused all medications, CBG checks, and repositioning. Patient continued to use foul language toward staff throughout shift, telling nursing staff to "Get the F out!" Toward the end of the shift, patient allowed the charge nurse to draw morning labs and to empty his nephrostomy bag, but continued to refuse care from nurse tech and primary nurse.

## 2021-04-18 NOTE — Progress Notes (Signed)
PROGRESS NOTE  Larry Morrison    DOB: May 17, 1971, 50 y.o.  UEK:[id number]  PCP: [facility]   Code Status: Full Code   DOA: 04/09/2021   LOS: 8  Brief Narrative of Current Hospitalization  Larry Morrison is a 50 y.o. male with a PMH significant for ESRD on HD MWF, HTN, severe protein calorie malnutrition, elevated troponin, anemia, tobacco use disorder, CVA, TBI. They presented from home to the ED on 04/09/2021 with SOB x1days. In the ED, it was found that they had volume overload due to missed HD sessions. They were treated with urgent dialysis as well as thoracentesis due to effusion. Nephrology was consulted. Patient underwent HD and experienced PEA arrest. He was subsequently intubated after ROSC. This occurred in setting of hyperkalemia and declining HD initially.   Patient was admitted to medicine service for further workup and management of acute hypoxic respiratory failure.  as outlined in detail below.  CCM summary: 11/19: Admitted to medsurg unit with volume overload due to missing HD. 11/20: Hyperkalemia with potassium of 6.2 getting [place].  Patient refusing dialysis today. 11/21: Patient went into PEA Cardiac arrest during HD session, intubated post ROSC and transferred to ICU. PCCM consulted 04/13/21- patient for SBT today to liberate from MV. Plan for HD today as well.  04/14/21- patient failed SBT with tachypnea and high volume inspissated respiratory secretions.  He is improving on antibiotics and dialysis.  04/15/21- patient failed SBT due to high secretions, he received glycopyrrolate but failed SBT again high secretions. 04/16/21- patient has improved will try to liberate from mechanical ventilator today  04/18/21 -stable  Assessment & Plan  Principal Problem:   Acute on chronic combined systolic and diastolic CHF (congestive heart failure) (HCC) Active Problems:   ESRD (end stage renal disease) (HCC)   Tobacco use disorder   Essential hypertension   Anemia  in ESRD (end-stage renal disease) (HCC)   Hyperkalemia   Elevated troponin   History of traumatic brain injury   Cognitive impairment   Pleural effusion on left   Noncompliance of patient with renal dialysis ([place])   Malnutrition of moderate degree   Cardiac arrest ([place])  Acute hypoxic respiratory failure- 2/2 volume overload. s/p extubation. Now stable on 2L [place]. Denies respiratory concerns today. No oxygen use at baseline.  - breathing treatments scheduled, PRN - continue HD for fluid removal  PEA arrest- in setting of hyperkalemia- resolved. - maintain cardiac tele  Sepsis- criteria has resolved - PT/OT  HFrEF  HTN- EF <20% - coreg if BP permits - will dc amlodipine in order to increase carvedilol  ESRD on HD MWF- electrolytes stable today - management per nephrology - patiromer  Chronic normocytic anemia of chronic disease- hgb stable 8.8 - stable, CBC on HD days - EPO with HD  Polysubstance use-  - nicotine replacement PRN - counseling  Severe protein calorie malnutrition  generalized physical deconditioning- - monitor for refeeding syndrome - nutritional supplements - TOC consult for SNF  DVT prophylaxis: heparin injection 5,000 Units Start: 04/09/21 2200 Place TED hose Start: 04/09/21 1830   Diet:  Diet Orders (From admission, onward)     Start     Ordered   04/17/21 1006  Diet NPO time specified Except for: Ice Chips  Diet effective now       Comments: Ice chips for comfort in moderation following oral care Meds via non-oral means  Question:  Except for  Answer:  Ice Chips   04/17/21 1007  Subjective 04/18/21    Pt reports no complaints today. Endorses that he is doing well overall.   Disposition Plan & Communication  Patient status: Inpatient  Admitted From: Home Disposition: Skilled nursing facility Anticipated discharge date: 11/30  Family Communication: none  Consults, Procedures, Significant Events  Consultants:   CCM Nephrology Cardiology   Procedures/significant events:  Intubated/extubated Debifrillation  HD Antimicrobials:  Anti-infectives (From admission, onward)    Start     Dose/Rate Route Frequency Ordered Stop   04/13/21 2200  piperacillin-tazobactam (ZOSYN) IVPB 2.25 g        2.25 g 100 mL/hr over 30 Minutes Intravenous Every 8 hours 04/13/21 1950 04/17/21 2245   04/13/21 2045  vancomycin (VANCOREADY) IVPB 500 mg/100 mL        500 mg 100 mL/hr over 60 Minutes Intravenous  Once 04/13/21 1950 04/14/21 0803   04/12/21 1115  doxycycline (VIBRAMYCIN) 100 mg in sodium chloride 0.9 % 250 mL IVPB        100 mg 125 mL/hr over 120 Minutes Intravenous Every 12 hours 04/12/21 1022 04/17/21 0020   04/11/21 2315  ceFEPIme (MAXIPIME) 1 g in sodium chloride 0.9 % 100 mL IVPB  Status:  Discontinued        1 g 200 mL/hr over 30 Minutes Intravenous Daily-1800 04/11/21 2216 04/12/21 1022   04/11/21 2315  vancomycin (VANCOREADY) IVPB 1000 mg/200 mL        1,000 mg 200 mL/hr over 60 Minutes Intravenous  Once 04/11/21 2218 04/12/21 0045       Objective   Vitals:   04/18/21 0300 04/18/21 0400 04/18/21 0500 04/18/21 0600  BP: (!) 151/97 (!) 155/98 (!) 149/92 (!) 142/89  Pulse:  (!) 103 99   Resp: (!) 42 (!) 29 (!) 37 (!) 27  Temp:      TempSrc:      SpO2:  (!) 86% 92%   Weight:      Height:        Intake/Output Summary (Last 24 hours) at 04/18/2021 0725 Last data filed at 04/18/2021 0500 Gross per 24 hour  Intake 867.3 ml  Output 300 ml  Net 567.3 ml   Filed Weights   04/13/21 1247 04/15/21 0134 04/15/21 1728  Weight: 49.1 kg 51.3 kg 51.3 kg    Patient BMI: Body mass index is 20.68 kg/m.   Physical Exam: General: awake, alert, NAD, frail HEENT: atraumatic, clear conjunctiva, anicteric sclera, moist mucus membranes, hearing grossly normal Respiratory: normal respiratory effort. Cardiovascular: normal S1/S2,  RRR, no JVD, murmurs, rubs, gallops, quick capillary refill   Gastrointestinal: soft, NT, ND, no HSM felt Nervous: A&O x2. no gross focal neurologic deficits, normal speech Extremities: moves all equally, trace edema, low tone Skin: dry, intact, normal temperature, normal color, No rashes, lesions or ulcers on exposed skin Psychiatry: flat mood  Labs   I have personally reviewed following labs and imaging studies CBC    Component Value Date/Time   WBC 7.5 04/18/2021 0556   RBC 2.87 (L) 04/18/2021 0556   HGB 8.8 (L) 04/18/2021 0556   HGB 13.5 10/22/2013 1732   HCT 27.6 (L) 04/18/2021 0556   HCT 40.8 10/22/2013 1732   PLT 206 04/18/2021 0556   PLT 261 10/22/2013 1732   MCV 96.2 04/18/2021 0556   MCV 94 10/22/2013 1732   MCH 30.7 04/18/2021 0556   MCHC 31.9 04/18/2021 0556   RDW 17.6 (H) 04/18/2021 0556   RDW 13.4 10/22/2013 1732   LYMPHSABS 1.0 04/11/2021 1449  LYMPHSABS 1.2 07/17/2012 1219   MONOABS 0.4 04/11/2021 1449   MONOABS 1.2 (H) 07/17/2012 1219   EOSABS 0.3 04/11/2021 1449   EOSABS 0.0 07/17/2012 1219   BASOSABS 0.1 04/11/2021 1449   BASOSABS 0.1 07/17/2012 1219   BMP Latest Ref Rng & Units 04/18/2021 04/17/2021 04/16/2021  Glucose 70 - 99 mg/dL 90 81 90  BUN 6 - 20 mg/dL 42(H) 35(H) 20  Creatinine 0.61 - 1.24 mg/dL 5.62(H) 4.33(H) 3.24(H)  Sodium 135 - 145 mmol/L 136 131(L) 135  Potassium 3.5 - 5.1 mmol/L 4.2 4.1 4.0  Chloride 98 - 111 mmol/L 102 96(L) 97(L)  CO2 22 - 32 mmol/L 21(L) 25 29  Calcium 8.9 - 10.3 mg/dL 8.8(L) 8.4(L) 8.2(L)    Imaging Studies  No results found. Medications   Scheduled Meds:  amLODipine  10 mg Oral Daily   vitamin C  500 mg Per Tube BID   carvedilol  3.125 mg Oral BID WC   chlorhexidine gluconate (MEDLINE KIT)  15 mL Mouth Rinse BID   Chlorhexidine Gluconate Cloth  6 each Topical Q0600   epoetin (EPOGEN/PROCRIT) injection  4,000 Units Intravenous Q M,W,F-HD   heparin  5,000 Units Subcutaneous Q8H   mouth rinse  15 mL Mouth Rinse 10 times per day   multivitamin  1 tablet Oral QHS    pantoprazole (PROTONIX) IV  40 mg Intravenous Q24H   patiromer  16.8 g Oral Daily   No recently discontinued medications to reconcile  LOS: 8 days   Time spent: >67mn  Kynlea Blackston L Chrishelle Zito, DO [facility] 04/18/2021, 7:25 AM   Please refer to amion to contact the [facility] or [facility]ing provider for this pt  [email or web address] Available by Epic secure chat 7AM-7PM. If 7PM-7AM, please contact night-coverage

## 2021-04-18 NOTE — Progress Notes (Signed)
[facility]  ROUNDING NOTE   Subjective:   Patient continues to do well off the ventilator. Appears to be less agitated today. Patient due for hemodialysis treatment today.   Objective:  Vital signs in last 24 hours:  Temp:  [98 F (36.7 C)] 98 F (36.7 C) (11/27 1100) Pulse Rate:  [86-105] 99 (11/28 0500) Resp:  [14-50] 27 (11/28 0600) BP: (125-170)/(81-112) 142/89 (11/28 0600) SpO2:  [86 %-98 %] 92 % (11/28 0500)  Weight change:  Filed Weights   04/13/21 1247 04/15/21 0134 04/15/21 1728  Weight: 49.1 kg 51.3 kg 51.3 kg    Intake/Output: I/O last 3 completed shifts: In: 877 [I.V.:577; IV Piggyback:300] Out: 096 [Urine:875]   Intake/Output this shift:  No intake/output data recorded.  Physical Exam: General: No acute distress  Head: Normocephalic, atraumatic, hearing intact  Lungs:  Scattered rhonchi, normal effort  Heart: Regular   Abdomen:  Soft, nontender, bowel sounds present  Extremities: no peripheral edema.  Neurologic: Awake, alert, follows simple commands  Skin: Warm   Access: Right IJ permcath  Right nephrostomy  Basic Metabolic Panel: Recent Labs  Lab 04/12/21 0540 04/13/21 0450 04/14/21 0515 04/15/21 0410 04/16/21 0352 04/17/21 0437 04/18/21 0556  NA 139   < > 133* 132* 135 131* 136  K 5.3*   < > 3.8 4.2 4.0 4.1 4.2  CL 104   < > 98 98 97* 96* 102  CO2 23   < > '27 25 29 25 ' 21*  GLUCOSE 89   < > 85 80 90 81 90  BUN 73*   < > 30* 41* 20 35* 42*  CREATININE 7.96*   < > 4.85* 5.27* 3.24* 4.33* 5.62*  CALCIUM 8.6*   < > 7.7* 7.4* 8.2* 8.4* 8.8*  MG 2.5*  --  1.7  --  1.8 1.9 2.0  PHOS 7.1*   < > 3.9 5.1* 3.4  3.5 3.7  3.6 4.3   < > = values in this interval not displayed.     Liver Function Tests: Recent Labs  Lab 04/11/21 1930 04/13/21 0450 04/14/21 0515 04/15/21 0410 04/16/21 0352 04/17/21 0437  AST 26  --   --   --   --   --   ALT 15  --   --   --   --   --   ALKPHOS 105  --   --   --   --   --   BILITOT 1.3*   --   --   --   --   --   PROT 7.3  --   --   --   --   --   ALBUMIN 2.3* 2.1* 2.0* 1.8* 2.4* 2.4*    No results for input(s): LIPASE, AMYLASE in the last 168 hours. No results for input(s): AMMONIA in the last 168 hours.  CBC: Recent Labs  Lab 04/11/21 1449 04/11/21 1930 04/14/21 0515 04/15/21 0410 04/16/21 0352 04/17/21 0437 04/18/21 0556  WBC 5.9   < > 5.6 4.4 5.2 7.9 7.5  NEUTROABS 4.2  --   --   --   --   --   --   HGB 10.0*   < > 8.8* 8.2* 8.5* 9.1* 8.8*  HCT 30.8*   < > 27.8* 26.7* 26.7* 28.5* 27.6*  MCV 96.3   < > 97.2 99.3 99.3 97.6 96.2  PLT 237   < > 161 141* 153 187 206   < > = values in this interval  not displayed.     Cardiac Enzymes: No results for input(s): CKTOTAL, CKMB, CKMBINDEX, TROPONINI in the last 168 hours.  BNP: Invalid input(s): POCBNP  CBG: Recent Labs  Lab 04/17/21 0813 04/17/21 1140 04/17/21 1503 04/18/21 0325 04/18/21 0740  GLUCAP 80 104* 95 87 88     Microbiology: Results for orders placed or performed during the hospital encounter of 04/09/21  Resp Panel by RT-PCR (Flu A&B, Covid) Nasopharyngeal Swab     Status: None   Collection Time: 04/09/21  7:29 PM   Specimen: Nasopharyngeal Swab; Nasopharyngeal(NP) swabs in vial transport medium  Result Value Ref Range Status   SARS Coronavirus 2 by RT PCR NEGATIVE NEGATIVE Final    Comment: (NOTE) SARS-CoV-2 target nucleic acids are NOT DETECTED.  The SARS-CoV-2 RNA is generally detectable in upper respiratory specimens during the acute phase of infection. The lowest concentration of SARS-CoV-2 viral copies this assay can detect is 138 copies/mL. A negative result does not preclude SARS-Cov-2 infection and should not be used as the sole basis for treatment or other patient management decisions. A negative result may occur with  improper specimen collection/handling, submission of specimen other than nasopharyngeal swab, presence of viral mutation(s) within the areas targeted by this  assay, and inadequate number of viral copies(<138 copies/mL). A negative result must be combined with clinical observations, patient history, and epidemiological information. The expected result is Negative.  Fact Sheet for Patients:  EntrepreneurPulse.com.au  Fact Sheet for Healthcare Providers:  IncredibleEmployment.be  This test is no t yet approved or cleared by the [place] FDA and  has been authorized for detection and/or diagnosis of SARS-CoV-2 by FDA under an Emergency Use Authorization (EUA). This EUA will remain  in effect (meaning this test can be used) for the duration of the COVID-19 declaration under Section 564(b)(1) of the Act, 21 U.S.C.section 360bbb-3(b)(1), unless the authorization is terminated  or revoked sooner.       Influenza A by PCR NEGATIVE NEGATIVE Final   Influenza B by PCR NEGATIVE NEGATIVE Final    Comment: (NOTE) The Xpert Xpress SARS-CoV-2/FLU/RSV plus assay is intended as an aid in the diagnosis of influenza from Nasopharyngeal swab specimens and should not be used as a sole basis for treatment. Nasal washings and aspirates are unacceptable for Xpert Xpress SARS-CoV-2/FLU/RSV testing.  Fact Sheet for Patients: EntrepreneurPulse.com.au  Fact Sheet for Healthcare Providers: IncredibleEmployment.be  This test is not yet approved or cleared by the [place] FDA and has been authorized for detection and/or diagnosis of SARS-CoV-2 by FDA under an Emergency Use Authorization (EUA). This EUA will remain in effect (meaning this test can be used) for the duration of the COVID-19 declaration under Section 564(b)(1) of the Act, 21 U.S.C. section 360bbb-3(b)(1), unless the authorization is terminated or revoked.  Performed at [facility], [address] [place], [place] [address]   MRSA Next Gen by PCR, Nasal     Status: Abnormal   Collection Time: 04/11/21  10:23 PM   Specimen: Nasal Mucosa; Nasal Swab  Result Value Ref Range Status   MRSA by PCR Next Gen DETECTED (A) NOT DETECTED Final    Comment: RESULT CALLED TO, READ BACK BY AND VERIFIED WITH: TESSYAMMA THOMAS '@0006'  04/12/21 RH (NOTE) The GeneXpert MRSA Assay (FDA approved for NASAL specimens only), is one component of a comprehensive MRSA colonization surveillance program. It is not intended to diagnose MRSA infection nor to guide or monitor treatment for MRSA infections. Test performance is not FDA approved in  patients less than 59 years old. Performed at [facility], [address] [place], [place] [address]   Urine Culture     Status: Abnormal   Collection Time: 04/11/21 10:30 PM   Specimen: In/Out Cath Urine  Result Value Ref Range Status   Specimen Description   Final    IN/OUT CATH URINE Performed at [facility], [address] [place], [place] [address]    Special Requests   Final    NONE Performed at [facility], [address] [place], [place] [address]    Culture (A)  Final    >=100,000 COLONIES/mL METHICILLIN RESISTANT STAPHYLOCOCCUS AUREUS   Report Status 04/16/2021 FINAL  Final   Organism ID, Bacteria METHICILLIN RESISTANT STAPHYLOCOCCUS AUREUS (A)  Final      Susceptibility   Methicillin resistant staphylococcus aureus - MIC*    CIPROFLOXACIN >=8 RESISTANT Resistant     GENTAMICIN <=0.5 SENSITIVE Sensitive     NITROFURANTOIN <=16 SENSITIVE Sensitive     OXACILLIN >=4 RESISTANT Resistant     TETRACYCLINE <=1 SENSITIVE Sensitive     VANCOMYCIN 1 SENSITIVE Sensitive     TRIMETH/SULFA <=10 SENSITIVE Sensitive     CLINDAMYCIN >=8 RESISTANT Resistant     RIFAMPIN <=0.5 SENSITIVE Sensitive     Inducible Clindamycin NEGATIVE Sensitive     * >=100,000 COLONIES/mL METHICILLIN RESISTANT STAPHYLOCOCCUS AUREUS  CULTURE, BLOOD (ROUTINE X 2) w Reflex to ID Panel     Status: None   Collection Time: 04/11/21 10:54 PM   Specimen: BLOOD  LEFT HAND  Result Value Ref Range Status   Specimen Description BLOOD LEFT HAND  Final   Special Requests   Final    BOTTLES DRAWN AEROBIC ONLY Blood Culture adequate volume   Culture   Final    NO GROWTH 5 DAYS Performed at [facility], [address] [place], [place] [address]    Report Status 04/16/2021 FINAL  Final  CULTURE, BLOOD (ROUTINE X 2) w Reflex to ID Panel     Status: None   Collection Time: 04/11/21 11:05 PM   Specimen: BLOOD LEFT HAND  Result Value Ref Range Status   Specimen Description BLOOD LEFT HAND  Final   Special Requests   Final    BOTTLES DRAWN AEROBIC ONLY Blood Culture adequate volume   Culture   Final    NO GROWTH 5 DAYS Performed at [facility], [address], [place], [place] [address]    Report Status 04/16/2021 FINAL  Final  Culture, Respiratory w Gram Stain     Status: None   Collection Time: 04/12/21 12:31 AM   Specimen: Tracheal Aspirate; Respiratory  Result Value Ref Range Status   Specimen Description   Final    TRACHEAL ASPIRATE Performed at [facility], [address], [place], [place] [address]    Special Requests   Final    NONE Performed at [facility], [place], [place] [address]    Gram Stain   Final    FEW SQUAMOUS EPITHELIAL CELLS PRESENT FEW WBC SEEN FEW GRAM POSITIVE RODS MODERATE GRAM POSITIVE COCCI    Culture   Final    ABUNDANT Consistent with normal respiratory flora. No Pseudomonas species isolated Performed at [facility] [address], [place], [place] [address]    Report Status 04/14/2021 FINAL  Final    Coagulation Studies: No results for input(s): LABPROT, INR in the last 72 hours.   Urinalysis: No results  for input(s): COLORURINE, LABSPEC, [place], GLUCOSEU, HGBUR, BILIRUBINUR, KETONESUR, PROTEINUR, UROBILINOGEN, NITRITE, LEUKOCYTESUR in the last 72 hours.  Invalid input(s): APPERANCEUR     Imaging: No results  found.   Medications:    dextrose 10 mL/hr at 04/18/21 0600    amLODipine  10 mg Oral Daily   vitamin C  500 mg Per Tube BID   carvedilol  3.125 mg Oral BID WC   chlorhexidine gluconate (MEDLINE KIT)  15 mL Mouth Rinse BID   Chlorhexidine Gluconate Cloth  6 each Topical Q0600   epoetin (EPOGEN/PROCRIT) injection  4,000 Units Intravenous Q M,W,F-HD   heparin  5,000 Units Subcutaneous Q8H   mouth rinse  15 mL Mouth Rinse 10 times per day   multivitamin  1 tablet Oral QHS   pantoprazole (PROTONIX) IV  40 mg Intravenous Q24H   patiromer  16.8 g Oral Daily   hydrALAZINE, ondansetron **OR** ondansetron (ZOFRAN) IV  Assessment/ Plan:  [name] is a 50 y.o. 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   -Cardiac arrest/CPR during HD on November 21.  Received about 2 L of fluid during resuscitation.  -Patient due for hemodialysis treatment today.  We will keep ultrafiltration target conservative today as he had cardiac arrest during his last dialysis treatment.   Anemia with chronic kidney disease:   Lab Results  Component Value Date   HGB 8.8 (L) 04/18/2021  Administer Epogen 4000 units IV with dialysis today.  Secondary Hyperparathyroidism: with hyperphosphatemia   Lab Results  Component Value Date   CALCIUM 8.8 (L) 04/18/2021   CAION 0.76 (LL) 08/07/2019   PHOS 4.3 04/18/2021    -phosphorus repleted this morning and currently 4.3.  Patient currently on binders.  4. Acute resp  failure Intubated post code, subsequently extubated Continue ventila patient extubated and breathing comfortably off the ventilator.  tory support at this time and plans for weaning as per pulmonary/critical care.    LOS: 8 Alondra Vandeven 11/28/20229:47 AM

## 2021-04-18 NOTE — Progress Notes (Signed)
Nutrition Follow-up  DOCUMENTATION CODES:   Non-severe (moderate) malnutrition in context of chronic illness  INTERVENTION:   -Continue 500 mg vitamin C BID -Continue renal MVI daily -Boost Breeze po TID, each supplement provides 250 kcal and 9 grams of protein  -30 ml Prosource Plus TID, each supplement provides 100 kcals and 15 grams protein   NUTRITION DIAGNOSIS:   Moderate Malnutrition related to chronic illness (ESRD on HD) as evidenced by mild fat depletion, mild muscle depletion, moderate muscle depletion.  Ongoing  GOAL:   Patient will meet greater than or equal to 90% of their needs  Progressing   MONITOR:   PO intake, Supplement acceptance, Diet advancement, Labs, Weight trends, Skin, I & O's  REASON FOR ASSESSMENT:   Malnutrition Screening Tool    ASSESSMENT:   50 y.o. 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  11/22- intubated 11/26- self-extubated 11/28- s/p BSE- advanced to dysphagia 2 diet with thin liquids  Reviewed I/O's: +352 ml x 24 hours and +3.2 L since admission  UOP: 525 ml x 24 hours   Pt in HD at time of visit.   Pt has had periods of agitation per chart review.   Pt just advanced to diet and no meal completion data available to assess at this time. Pt does not like Ensure supplements. Pt would benefit from addition of oral nutrition supplements given malnutrition.   Medications reviewed and include vitamin C and dextrose 10% infusion @ 20 ml/hr.   Labs reviewed: K, Mg, and Phos WDL. CBGS: 77-164.   Diet Order:   Diet Order             DIET DYS 2 Room service appropriate? Yes with Assist; Fluid consistency: Thin  Diet effective now                    EDUCATION NEEDS:   Education needs have been addressed  Skin:  Skin Assessment: Reviewed RN Assessment  Last BM:  04/17/21  Height:   Ht Readings from Last 1 Encounters:  04/16/21 5' 2.01" (1.575 m)    Weight:   Wt Readings from Last 1 Encounters:  04/15/21 51.3 kg    Ideal Body Weight:  53.6 kg  BMI:  Body mass index is 20.68 kg/m.  Estimated Nutritional Needs:   Kcal:  1850-2050  Protein:  90-105 grams  Fluid:  1000 ml + UOP    [name], RD, LDN, [place] Registered Dietitian II Certified Diabetes Care and Education Specialist Please refer to [facility] for RD and/or RD on-call/weekend/after hours pager

## 2021-04-18 NOTE — Plan of Care (Signed)
  Problem: Education: Goal: Knowledge of General Education information will improve Description: Including pain rating scale, medication(s)/side effects and non-pharmacologic comfort measures Outcome: Not Progressing   Problem: Health Behavior/Discharge Planning: Goal: Ability to manage health-related needs will improve Outcome: Not Progressing   Problem: Clinical Measurements: Goal: Will remain free from infection Outcome: Progressing Goal: Diagnostic test results will improve Outcome: Progressing Goal: Respiratory complications will improve Outcome: Progressing Goal: Cardiovascular complication will be avoided Outcome: Progressing   Problem: Activity: Goal: Risk for activity intolerance will decrease Outcome: Progressing   Problem: Nutrition: Goal: Adequate nutrition will be maintained Outcome: Not Progressing   Problem: Elimination: Goal: Will not experience complications related to bowel motility Outcome: Progressing Goal: Will not experience complications related to urinary retention Outcome: Not Progressing   Problem: Pain Managment: Goal: General experience of comfort will improve Outcome: Progressing   Problem: Safety: Goal: Ability to remain free from injury will improve Outcome: Progressing   Problem: Skin Integrity: Goal: Risk for impaired skin integrity will decrease Outcome: Progressing

## 2021-04-18 NOTE — Progress Notes (Signed)
Speech Language Pathology Treatment:    Patient Details Name: Larry Morrison MRN: [id number] DOB: [birth year] Today's Date: 04/18/2021 Time: 8250-5397 SLP Time Calculation (min) (ACUTE ONLY): 30 min  Assessment / Plan / Recommendation Clinical Impression  Pt seen for clinical swallowing re-evaluation. Cleared with RN. Pt alert. Stating "get out," upon SLP entrance to room. Required education for participation in SLP services. On 2L/min O2 via [place]. RN assisted with repositioning. Per pt report, pt in elevated sidelying position with head turned to R.   Pt given trials of ice chips (x5), thin liquids (via tsp and straw sip; ~4 oz), pureed (x5 teaspoons), and sold (x2 saltine crackers in 2 halves and 1 whole cracker). Unable to present thin liquids via cup due to reduced jaw opening in current position. Difficulty with self-feeding noted. Pt unable to bring cup or teaspoon to mouth without HOH assistance.   Based on today's assessment, pt presents with a mild-moderate oral dysphagia c/b prolonged mastication and oral residual with select solid. Liquid wash was effective in clearing oral stasis. Noted when pt fed self, pt took larger bites of saltine cracker which increased mastication time and oral stasis. Suspect oral deficits due to dental status, overall deconditioning, and affected by mental status. No overt s/sx pharyngeal dysphagia noted across trials. To palpation, swallow initiation appeared timely and laryngeal elevation appeared adequate. Vocal quality remain unchanged across trials.   Recommend initiation of Dysphagia 2 (chopped) Diet with Thin Liquids and safe swallowing strategies/aspiration precautions as outlined below.   Pt is at increased risk for aspiration/aspiration PNA given need for assistance with feeding, dental status, mental status, recent/prolonged intubation, and multiple medical comorbidities.   SLP to f/u per POC for diet tolerance and trials of upgraded textures.   Pt and  RN made aware of results of re-assessment, diet recommendations, safe swallowing strategies/aspiration precautions, and SLP POC. ?full understanding by pt. Reinforcement of content may be needed. Communication board in pt's room updated with diet recommendations and safe swallowing strategies/aspiration precautions.   HPI HPI: Per 68 H&P 04/09/21 Larry Morrison is a 50 y.o. male with medical history significant for end-stage renal disease on hemodialysis, frequent noncompliance with hemodialysis, hypertension, severe protein calorie malnutrition, elevated troponin, history of pleural effusion, anemia due to chronic kidney disease, tobacco use disorder, history of CVA, history of TBI, who presents emergency department for chief concerns of shortness of breath.     He reports that shortness of breath started on a.m. on day of admission.  He reports this feels similar to previous episodes of missing dialysis session.  He states that he would never miss dialysis sessions again.  He states he missed it because he had to see his mother yesterday.     Bedside shortness of breath with exertion and laying flat, he denies any chest pain, double vision, headaches, abdominal pain, syncope, loss of consciousness, changes to his lower extremity swelling.    He does endorse diarrhea that has been ongoing for the last 2 to 3 days.  He denies any recent antibiotic use and/or known sick contacts.     At bedside he is able to tell me his name, age, current location of hospital.  When his dinner plate arrived, and he saw salad, he explained loudly how this please he is that he has received a salad and that there is broccoli on his plate of chicken and rice.       I encouraged him to eat his vegetables as this is healthy for  him." Pt with PEA cardiac arrest on 11/21. Intubated 11/21-11/27 (self-extubated).      SLP Plan  Goals updated      Recommendations for follow up therapy are one component of a multi-disciplinary  discharge planning process, led by the attending physician.  Recommendations may be updated based on patient status, additional functional criteria and insurance authorization.    Recommendations  Diet recommendations: Dysphagia 2 (fine chop);Thin liquid Medication Administration: Crushed with puree Supervision: Staff to assist with self feeding;Full supervision/cueing for compensatory strategies Compensations: Minimize environmental distractions;Slow rate;Small sips/bites;Follow solids with liquid Postural Changes and/or Swallow Maneuvers: Seated upright 90 degrees;Upright 30-60 min after meal (upright as possible during and after POs)                Oral Care Recommendations: Oral care QID;Oral care prior to ice chip/H20;Staff/trained caregiver to provide oral care Follow Up Recommendations: Acute inpatient rehab (3hours/day) Assistance recommended at discharge: Frequent or constant Supervision/Assistance SLP Visit Diagnosis: Dysphagia, oral phase (R13.11) Plan: Goals updated       Larry Morrison, M.S., [facility] [phone] Larry Morrison)                 Larry Morrison  04/18/2021, 10:05 AM

## 2021-04-19 DIAGNOSIS — R0602 Shortness of breath: Secondary | ICD-10-CM | POA: Diagnosis not present

## 2021-04-19 DIAGNOSIS — I469 Cardiac arrest, cause unspecified: Secondary | ICD-10-CM | POA: Diagnosis not present

## 2021-04-19 DIAGNOSIS — I5043 Acute on chronic combined systolic (congestive) and diastolic (congestive) heart failure: Secondary | ICD-10-CM | POA: Diagnosis not present

## 2021-04-19 DIAGNOSIS — N186 End stage renal disease: Secondary | ICD-10-CM | POA: Diagnosis not present

## 2021-04-19 LAB — BASIC METABOLIC PANEL
Anion gap: 10 (ref 5–15)
BUN: 23 mg/dL — ABNORMAL HIGH (ref 6–20)
CO2: 27 mmol/L (ref 22–32)
Calcium: 8.6 mg/dL — ABNORMAL LOW (ref 8.9–10.3)
Chloride: 100 mmol/L (ref 98–111)
Creatinine, Ser: 4.35 mg/dL — ABNORMAL HIGH (ref 0.61–1.24)
GFR, Estimated: 16 mL/min — ABNORMAL LOW (ref >60)
Glucose, Bld: 109 mg/dL — ABNORMAL HIGH (ref 70–99)
Potassium: 3.4 mmol/L — ABNORMAL LOW (ref 3.5–5.1)
Sodium: 137 mmol/L (ref 135–145)

## 2021-04-19 LAB — GLUCOSE, CAPILLARY
Glucose-Capillary: 76 mg/dL (ref 70–99)
Glucose-Capillary: 113 mg/dL — ABNORMAL HIGH (ref 70–99)

## 2021-04-19 LAB — ACID FAST CULTURE WITH REFLEXED SENSITIVITIES (MYCOBACTERIA): Acid Fast Culture: NEGATIVE

## 2021-04-19 MED ORDER — CARVEDILOL 25 MG PO TABS
25.0000 mg | ORAL_TABLET | Freq: Two times a day (BID) | ORAL | Status: DC
  Administered 2021-04-19 – 2021-04-21 (×3): 25 mg via ORAL
  Filled 2021-04-19 (×2): qty 1

## 2021-04-19 MED ORDER — CARVEDILOL 12.5 MG PO TABS
12.5000 mg | ORAL_TABLET | Freq: Two times a day (BID) | ORAL | Status: DC
  Administered 2021-04-19: 12.5 mg via ORAL

## 2021-04-19 MED ORDER — CARVEDILOL 25 MG PO TABS
ORAL_TABLET | ORAL | Status: AC
  Filled 2021-04-19: qty 1

## 2021-04-19 MED ORDER — PANTOPRAZOLE SODIUM 40 MG PO TBEC
40.0000 mg | DELAYED_RELEASE_TABLET | Freq: Every day | ORAL | Status: DC
  Administered 2021-04-20 – 2021-04-21 (×2): 40 mg via ORAL
  Filled 2021-04-19 (×2): qty 1

## 2021-04-19 NOTE — TOC Progression Note (Signed)
Transition of Care [facility]) - Progression Note    Patient Details  Name: Larry Morrison MRN: [id number] Date of Birth: December 28, 1970  [facility] CM/SW Contact  Beverly Sessions, RN Phone Number: 04/19/2021, 6:58 PM  Clinical Narrative:     Patient refusing to work with PT, OT, or psych  Per TOC note from 11/22 APS is pursing gaurdianship   Expected Discharge Plan: [place] Barriers to Discharge: Continued Medical Work up  Expected Discharge Plan and Services Expected Discharge Plan: [place]   Discharge Planning Services: CM Consult   Living arrangements for the past 2 months: Apartment                 DME Arranged: N/A DME Agency: NA       HH Arranged: NA HH Agency: NA         Social Determinants of Health (SDOH) Interventions    Readmission Risk Interventions Readmission Risk Prevention Plan 04/12/2021 09/08/2020  Transportation Screening Complete Complete  Social Work Consult for Mutual Planning/Counseling - Complete  Palliative Care Screening - Complete  Medication Review Press photographer) Complete Complete  PCP or Specialist appointment within 3-5 days of discharge Complete -  [place] or [place] Complete -  [place] or [place] Pt Refusal Comments has in home care aids -  SW Recovery Care/Counseling Consult Not Complete -  SW Consult Not Complete Comments pt intubated -  Palliative Care Screening Not Complete -  Comments intubated -  Skilled Nursing Facility Not Complete -  SNF Comments intubated -  Some recent data might be hidden

## 2021-04-19 NOTE — Progress Notes (Signed)
PHARMACIST - PHYSICIAN COMMUNICATION  Dr:   Ouida Sills  CONCERNING: IV to Oral Route Change Policy  RECOMMENDATION: This patient is receiving pantoprazole by the intravenous route.  Based on criteria approved by the Pharmacy and Therapeutics Committee, the intravenous medication(s) is/are being converted to the equivalent oral dose form(s).   DESCRIPTION: These criteria include: The patient is eating (either orally or via tube) and/or has been taking other orally administered medications for a least 24 hours The patient has no evidence of active gastrointestinal bleeding or impaired GI absorption (gastrectomy, short bowel, patient on TNA or NPO).  If you have questions about this conversion, please contact the Pharmacy Department  [x]   [phone] )  [facility] []   [phone] )  [facility] []   [phone] )  [facility] []   [phone] )  [facility] []   [phone] )  [place], [facility] 04/19/2021 12:36 PM

## 2021-04-19 NOTE — Progress Notes (Signed)
PROGRESS NOTE  Larry Morrison    DOB: 02/25/1971, 50 y.o.  QQP:[id number]  PCP: [facility]   Code Status: Full Code   DOA: 04/09/2021   LOS: 9  Brief Narrative of Current Hospitalization  Larry Morrison is a 50 y.o. male with a PMH significant for ESRD on HD MWF, HTN, severe protein calorie malnutrition, elevated troponin, anemia, tobacco use disorder, CVA, TBI. They presented from home to the ED on 04/09/2021 with SOB x1days. In the ED, it was found that they had volume overload due to missed HD sessions. They were treated with urgent dialysis as well as thoracentesis due to effusion. Nephrology was consulted. Patient underwent HD and experienced PEA arrest. He was subsequently intubated after ROSC. This occurred in setting of hyperkalemia and declining HD initially.   Patient was admitted to medicine service for further workup and management of acute hypoxic respiratory failure.  as outlined in detail below.  CCM summary: 11/19: Admitted to medsurg unit with volume overload due to missing HD. 11/20: Hyperkalemia with potassium of 6.2 getting [place].  Patient refusing dialysis today. 11/21: Patient went into PEA Cardiac arrest during HD session, intubated post ROSC and transferred to ICU. PCCM consulted 04/13/21- patient for SBT today to liberate from MV. Plan for HD today as well.  04/14/21- patient failed SBT with tachypnea and high volume inspissated respiratory secretions.  He is improving on antibiotics and dialysis.  04/15/21- patient failed SBT due to high secretions, he received glycopyrrolate but failed SBT again high secretions. 04/16/21- patient has improved will try to liberate from mechanical ventilator today  04/19/21 -stable  Assessment & Plan  Principal Problem:   Acute on chronic combined systolic and diastolic CHF (congestive heart failure) (HCC) Active Problems:   ESRD (end stage renal disease) (HCC)   Tobacco use disorder   Essential hypertension   Anemia  in ESRD (end-stage renal disease) (HCC)   Hyperkalemia   Elevated troponin   History of traumatic brain injury   Cognitive impairment   Pleural effusion on left   Noncompliance of patient with renal dialysis (Larry Morrison)   Malnutrition of moderate degree   Cardiac arrest (Larry Morrison)   Acute respiratory failure with hypoxia (HCC)   Shortness of breath  Acute hypoxic respiratory failure- 2/2 volume overload. s/p extubation. Now stable ORA with intermittent 2Lnc but no documented desats. Denies respiratory concerns today. No oxygen use at baseline.  - breathing treatments scheduled, PRN - continue HD for fluid removal  PEA arrest- in setting of hyperkalemia- resolved. - maintain cardiac tele  Sepsis- criteria has resolved - PT/OT  HFrEF  HTN- EF <20%. Bps remain elevated [phone] systolics.  - coreg increased dose  ESRD on HD MWF- electrolytes stable today - management per nephrology - patiromer  Chronic normocytic anemia of chronic disease- hgb stable 8.8 - stable, CBC on HD days - EPO with HD  Polysubstance use-  - nicotine replacement PRN - counseling  Severe protein calorie malnutrition  generalized physical deconditioning- - monitor for refeeding syndrome - nutritional supplements - TOC consult for SNF  DVT prophylaxis: heparin injection 5,000 Units Start: 04/09/21 2200 Place TED hose Start: 04/09/21 1830   Diet:  Diet Orders (From admission, onward)     Start     Ordered   04/18/21 0919  DIET DYS 2 Room service appropriate? Yes with Assist; Fluid consistency: Thin  Diet effective now       Comments: Extra gravies, sauces, condiments to moisten food  Question Answer Comment  Room service appropriate? Yes with Assist   Fluid consistency: Thin      04/18/21 0920            Subjective 04/19/21    Pt reports doing well today. He has no complaints.   Disposition Plan & Communication  Patient status: Inpatient  Admitted From: Home Disposition: Skilled nursing  facility, medically stable for dc when bed available Anticipated discharge date: 11/30  Family Communication: none  Consults, Procedures, Significant Events  Consultants:  CCM Nephrology Cardiology   Procedures/significant events:  Intubated/extubated Debifrillation  HD Antimicrobials:  Anti-infectives (From admission, onward)    Start     Dose/Rate Route Frequency Ordered Stop   04/13/21 2200  piperacillin-tazobactam (ZOSYN) IVPB 2.25 g        2.25 g 100 mL/hr over 30 Minutes Intravenous Every 8 hours 04/13/21 1950 04/17/21 2245   04/13/21 2045  vancomycin (VANCOREADY) IVPB 500 mg/100 mL        500 mg 100 mL/hr over 60 Minutes Intravenous  Once 04/13/21 1950 04/14/21 0803   04/12/21 1115  doxycycline (VIBRAMYCIN) 100 mg in sodium chloride 0.9 % 250 mL IVPB        100 mg 125 mL/hr over 120 Minutes Intravenous Every 12 hours 04/12/21 1022 04/17/21 0020   04/11/21 2315  ceFEPIme (MAXIPIME) 1 g in sodium chloride 0.9 % 100 mL IVPB  Status:  Discontinued        1 g 200 mL/hr over 30 Minutes Intravenous Daily-1800 04/11/21 2216 04/12/21 1022   04/11/21 2315  vancomycin (VANCOREADY) IVPB 1000 mg/200 mL        1,000 mg 200 mL/hr over 60 Minutes Intravenous  Once 04/11/21 2218 04/12/21 0045       Objective   Vitals:   04/18/21 1800 04/18/21 1900 04/18/21 2000 04/18/21 2104  BP: (!) 159/97 (!) 162/97 (!) 153/105 (!) 161/104  Pulse: 99 96 90 92  Resp: 15 (!) 30 (!) 29 20  Temp:    97.7 F (36.5 C)  TempSrc:    Oral  SpO2: 92% 95% 98% 100%  Weight:      Height:        Intake/Output Summary (Last 24 hours) at 04/19/2021 0646 Last data filed at 04/18/2021 2100 Gross per 24 hour  Intake 0 ml  Output 1600 ml  Net -1600 ml    Filed Weights   04/15/21 0134 04/15/21 1728 04/18/21 1330  Weight: 51.3 kg 51.3 kg 51 kg    Patient BMI: Body mass index is 20.56 kg/m.   Physical Exam: General: awake, alert, NAD, frail HEENT: atraumatic, clear conjunctiva, anicteric sclera,  moist mucus membranes, hearing grossly normal Respiratory: normal respiratory effort. Cardiovascular: normal S1/S2,  RRR, no JVD, murmurs, rubs, gallops, quick capillary refill  Gastrointestinal: soft, NT, ND, no HSM felt Nervous: A&O x2. no gross focal neurologic deficits, normal speech Extremities: moves all equally, trace edema, low tone Skin: dry, intact, normal temperature, normal color, No rashes, lesions or ulcers on exposed skin Psychiatry: flat mood  Labs   I have personally reviewed following labs and imaging studies CBC    Component Value Date/Time   WBC 7.5 04/18/2021 0556   RBC 2.87 (L) 04/18/2021 0556   HGB 8.8 (L) 04/18/2021 0556   HGB 13.5 10/22/2013 1732   HCT 27.6 (L) 04/18/2021 0556   HCT 40.8 10/22/2013 1732   PLT 206 04/18/2021 0556   PLT 261 10/22/2013 1732   MCV 96.2 04/18/2021 0556   MCV 94 10/22/2013 1732  MCH 30.7 04/18/2021 0556   MCHC 31.9 04/18/2021 0556   RDW 17.6 (H) 04/18/2021 0556   RDW 13.4 10/22/2013 1732   LYMPHSABS 1.0 04/11/2021 1449   LYMPHSABS 1.2 07/17/2012 1219   MONOABS 0.4 04/11/2021 1449   MONOABS 1.2 (H) 07/17/2012 1219   EOSABS 0.3 04/11/2021 1449   EOSABS 0.0 07/17/2012 1219   BASOSABS 0.1 04/11/2021 1449   BASOSABS 0.1 07/17/2012 1219   BMP Latest Ref Rng & Units 04/18/2021 04/17/2021 04/16/2021  Glucose 70 - 99 mg/dL 90 81 90  BUN 6 - 20 mg/dL 42(H) 35(H) 20  Creatinine 0.61 - 1.24 mg/dL 5.62(H) 4.33(H) 3.24(H)  Sodium 135 - 145 mmol/L 136 131(L) 135  Potassium 3.5 - 5.1 mmol/L 4.2 4.1 4.0  Chloride 98 - 111 mmol/L 102 96(L) 97(L)  CO2 22 - 32 mmol/L 21(L) 25 29  Calcium 8.9 - 10.3 mg/dL 8.8(L) 8.4(L) 8.2(L)    Imaging Studies  No results found. Medications   Scheduled Meds:  (feeding supplement) PROSource Plus  30 mL Oral TID BM   vitamin C  500 mg Per Tube BID   carvedilol  6.25 mg Oral BID WC   chlorhexidine gluconate (MEDLINE KIT)  15 mL Mouth Rinse BID   Chlorhexidine Gluconate Cloth  6 each Topical Q0600    epoetin (EPOGEN/PROCRIT) injection  4,000 Units Intravenous Q M,W,F-HD   feeding supplement  1 Container Oral TID BM   heparin  5,000 Units Subcutaneous Q8H   mouth rinse  15 mL Mouth Rinse 10 times per day   multivitamin  1 tablet Oral QHS   pantoprazole (PROTONIX) IV  40 mg Intravenous Q24H   patiromer  16.8 g Oral Daily   No recently discontinued medications to reconcile  LOS: 9 days   Time spent: >79mn  Hector Taft L Doshia Dalia, DO [facility] 04/19/2021, 6:46 AM   Please refer to amion to contact the [facility] Center[facility]ending or Consulting provider for this pt  [email or web address] Available by Epic secure chat 7AM-7PM. If 7PM-7AM, please contact night-coverage

## 2021-04-19 NOTE — Progress Notes (Signed)
Patient refusing to apply telemetry when nurse attempted patient stated "that's some bullshit no man, I ain't putting that shit on".

## 2021-04-19 NOTE — Progress Notes (Signed)
Patient has been refusing care since transfer to unit. He has used profanity toward nurse and nurse tech stating "I ain't taking no damn medicine", "you not about to check shit". Nurse educated patient on need and importance for him to follow care, but he only allowed vitals to be taken

## 2021-04-19 NOTE — Progress Notes (Addendum)
[facility]  ROUNDING NOTE   Subjective:   Patient resting quietly Denies shortness of breath, currently on 2 L Awaiting breakfast tray   Objective:  Vital signs in last 24 hours:  Temp:  [97.7 F (36.5 C)-105.8 F (41 C)] 97.9 F (36.6 C) (11/29 0913) Pulse Rate:  [85-99] 93 (11/29 0913) Resp:  [15-49] 20 (11/28 2104) BP: (128-162)/(82-109) 159/103 (11/29 0913) SpO2:  [91 %-100 %] 99 % (11/29 0913) Weight:  [51 kg] 51 kg (11/28 1330)  Weight change:  Filed Weights   04/15/21 0134 04/15/21 1728 04/18/21 1330  Weight: 51.3 kg 51.3 kg 51 kg    Intake/Output: I/O last 3 completed shifts: In: 230.9 [I.V.:180.9; IV Piggyback:50] Out: 5974 [Urine:325; Other:1500]   Intake/Output this shift:  No intake/output data recorded.  Physical Exam: General: No acute distress  Head: Normocephalic, atraumatic, hearing intact  Lungs:  Scattered rhonchi, normal effort, [place] O2  Heart: Regular   Abdomen:  Soft, nontender, bowel sounds present  Extremities: no peripheral edema.  Neurologic: Awake, alert, follows simple commands  Skin: Warm   Access: Right IJ permcath  Right nephrostomy  Basic Metabolic Panel: Recent Labs  Lab 04/14/21 0515 04/15/21 0410 04/16/21 0352 04/17/21 0437 04/18/21 0556  NA 133* 132* 135 131* 136  K 3.8 4.2 4.0 4.1 4.2  CL 98 98 97* 96* 102  CO2 _0 21*  GLUCOSE 85 80 90 81 90  BUN 30* 41* 20 35* 42*  CREATININE 4.85* 5.27* 3.24* 4.33* 5.62*  CALCIUM 7.7* 7.4* 8.2* 8.4* 8.8*  MG 1.7  --  1.8 1.9 2.0  PHOS 3.9 5.1* 3.4  3.5 3.7  3.6 4.3     Liver Function Tests: Recent Labs  Lab 04/13/21 0450 04/14/21 0515 04/15/21 0410 04/16/21 0352 04/17/21 0437  ALBUMIN 2.1* 2.0* 1.8* 2.4* 2.4*    No results for input(s): LIPASE, AMYLASE in the last 168 hours. No results for input(s): AMMONIA in the last 168 hours.  CBC: Recent Labs  Lab 04/14/21 0515 04/15/21 0410 04/16/21 0352 04/17/21 0437 04/18/21 0556  WBC 5.6  4.4 5.2 7.9 7.5  HGB 8.8* 8.2* 8.5* 9.1* 8.8*  HCT 27.8* 26.7* 26.7* 28.5* 27.6*  MCV 97.2 99.3 99.3 97.6 96.2  PLT 161 141* 153 187 206     Cardiac Enzymes: No results for input(s): CKTOTAL, CKMB, CKMBINDEX, TROPONINI in the last 168 hours.  BNP: Invalid input(s): POCBNP  CBG: Recent Labs  Lab 04/17/21 1503 04/18/21 0325 04/18/21 0740 04/18/21 1519 04/19/21 0916  GLUCAP 95 87 88 101* 76     Microbiology: Results for orders placed or performed during the hospital encounter of 04/09/21  Resp Panel by RT-PCR (Flu A&B, Covid) Nasopharyngeal Swab     Status: None   Collection Time: 04/09/21  7:29 PM   Specimen: Nasopharyngeal Swab; Nasopharyngeal(NP) swabs in vial transport medium  Result Value Ref Range Status   SARS Coronavirus 2 by RT PCR NEGATIVE NEGATIVE Final    Comment: (NOTE) SARS-CoV-2 target nucleic acids are NOT DETECTED.  The SARS-CoV-2 RNA is generally detectable in upper respiratory specimens during the acute phase of infection. The lowest concentration of SARS-CoV-2 viral copies this assay can detect is 138 copies/mL. A negative result does not preclude SARS-Cov-2 infection and should not be used as the sole basis for treatment or other patient management decisions. A negative result may occur with  improper specimen collection/handling, submission of specimen other than nasopharyngeal swab, presence of viral mutation(s) within the areas targeted  by this assay, and inadequate number of viral copies(<138 copies/mL). A negative result must be combined with clinical observations, patient history, and epidemiological information. The expected result is Negative.  Fact Sheet for Patients:  EntrepreneurPulse.com.au  Fact Sheet for Healthcare Providers:  IncredibleEmployment.be  This test is no t yet approved or cleared by the [place] FDA and  has been authorized for detection and/or diagnosis of SARS-CoV-2 by FDA  under an Emergency Use Authorization (EUA). This EUA will remain  in effect (meaning this test can be used) for the duration of the COVID-19 declaration under Section 564(b)(1) of the Act, 21 U.S.C.section 360bbb-3(b)(1), unless the authorization is terminated  or revoked sooner.       Influenza A by PCR NEGATIVE NEGATIVE Final   Influenza B by PCR NEGATIVE NEGATIVE Final    Comment: (NOTE) The Xpert Xpress SARS-CoV-2/FLU/RSV plus assay is intended as an aid in the diagnosis of influenza from Nasopharyngeal swab specimens and should not be used as a sole basis for treatment. Nasal washings and aspirates are unacceptable for Xpert Xpress SARS-CoV-2/FLU/RSV testing.  Fact Sheet for Patients: EntrepreneurPulse.com.au  Fact Sheet for Healthcare Providers: IncredibleEmployment.be  This test is not yet approved or cleared by the [place] FDA and has been authorized for detection and/or diagnosis of SARS-CoV-2 by FDA under an Emergency Use Authorization (EUA). This EUA will remain in effect (meaning this test can be used) for the duration of the COVID-19 declaration under Section 564(b)(1) of the Act, 21 U.S.C. section 360bbb-3(b)(1), unless the authorization is terminated or revoked.  Performed at [facility], [address] [place], [place] [address]   MRSA Next Gen by PCR, Nasal     Status: Abnormal   Collection Time: 04/11/21 10:23 PM   Specimen: Nasal Mucosa; Nasal Swab  Result Value Ref Range Status   MRSA by PCR Next Gen DETECTED (A) NOT DETECTED Final    Comment: RESULT CALLED TO, READ BACK BY AND VERIFIED WITH: TESSYAMMA THOMAS _0  04/12/21 RH (NOTE) The GeneXpert MRSA Assay (FDA approved for NASAL specimens only), is one component of a comprehensive MRSA colonization surveillance program. It is not intended to diagnose MRSA infection nor to guide or monitor treatment for MRSA infections. Test performance is not FDA  approved in patients less than 54 years old. Performed at [facility], [address] [place], [place] [address]   Urine Culture     Status: Abnormal   Collection Time: 04/11/21 10:30 PM   Specimen: In/Out Cath Urine  Result Value Ref Range Status   Specimen Description   Final    IN/OUT CATH URINE Performed at [facility], [address] [place], [place] [address]    Special Requests   Final    NONE Performed at [facility], [address] [place], [place] [address]    Culture (A)  Final    >=100,000 COLONIES/mL METHICILLIN RESISTANT STAPHYLOCOCCUS AUREUS   Report Status 04/16/2021 FINAL  Final   Organism ID, Bacteria METHICILLIN RESISTANT STAPHYLOCOCCUS AUREUS (A)  Final      Susceptibility   Methicillin resistant staphylococcus aureus - MIC*    CIPROFLOXACIN >=8 RESISTANT Resistant     GENTAMICIN <=0.5 SENSITIVE Sensitive     NITROFURANTOIN <=16 SENSITIVE Sensitive     OXACILLIN >=4 RESISTANT Resistant     TETRACYCLINE <=1 SENSITIVE Sensitive     VANCOMYCIN 1 SENSITIVE Sensitive     TRIMETH/SULFA <=10 SENSITIVE Sensitive     CLINDAMYCIN >=8 RESISTANT Resistant  RIFAMPIN <=0.5 SENSITIVE Sensitive     Inducible Clindamycin NEGATIVE Sensitive     * >=100,000 COLONIES/mL METHICILLIN RESISTANT STAPHYLOCOCCUS AUREUS  CULTURE, BLOOD (ROUTINE X 2) w Reflex to ID Panel     Status: None   Collection Time: 04/11/21 10:54 PM   Specimen: BLOOD LEFT HAND  Result Value Ref Range Status   Specimen Description BLOOD LEFT HAND  Final   Special Requests   Final    BOTTLES DRAWN AEROBIC ONLY Blood Culture adequate volume   Culture   Final    NO GROWTH 5 DAYS Performed at [facility], [address] [place], [place] [address]    Report Status 04/16/2021 FINAL  Final  CULTURE, BLOOD (ROUTINE X 2) w Reflex to ID Panel     Status: None   Collection Time: 04/11/21 11:05 PM   Specimen: BLOOD LEFT HAND  Result Value Ref Range Status    Specimen Description BLOOD LEFT HAND  Final   Special Requests   Final    BOTTLES DRAWN AEROBIC ONLY Blood Culture adequate volume   Culture   Final    NO GROWTH 5 DAYS Performed at [facility], [address] [place], [place] [address]    Report Status 04/16/2021 FINAL  Final  Culture, Respiratory w Gram Stain     Status: None   Collection Time: 04/12/21 12:31 AM   Specimen: Tracheal Aspirate; Respiratory  Result Value Ref Range Status   Specimen Description   Final    TRACHEAL ASPIRATE Performed at [facility], [address] [place], [place] [address]    Special Requests   Final    NONE Performed at [facility], [place], [place] [address]    Gram Stain   Final    FEW SQUAMOUS EPITHELIAL CELLS PRESENT FEW WBC SEEN FEW GRAM POSITIVE RODS MODERATE GRAM POSITIVE COCCI    Culture   Final    ABUNDANT Consistent with normal respiratory flora. No Pseudomonas species isolated Performed at [facility] [address], [place],  [address]    Report Status 04/14/2021 FINAL  Final    Coagulation Studies: No results for input(s): LABPROT, INR in the last 72 hours.   Urinalysis: No results for input(s): COLORURINE, LABSPEC, PHURINE, GLUCOSEU, HGBUR, BILIRUBINUR, KETONESUR, PROTEINUR, UROBILINOGEN, NITRITE, LEUKOCYTESUR in the last 72 hours.  Invalid input(s): APPERANCEUR     Imaging: No results found.   Medications:    dextrose 10 mL/hr at 04/18/21 0600    (feeding supplement) PROSource Plus  30 mL Oral TID BM   vitamin C  500 mg Per Tube BID   carvedilol  12.5 mg Oral BID WC   chlorhexidine gluconate (MEDLINE KIT)  15 mL Mouth Rinse BID   Chlorhexidine Gluconate Cloth  6 each Topical Q0600   epoetin (EPOGEN/PROCRIT) injection  4,000 Units Intravenous Q M,W,F-HD   feeding supplement  1 Container Oral TID BM   heparin  5,000 Units Subcutaneous Q8H   mouth rinse  15 mL Mouth Rinse 10 times per day    multivitamin  1 tablet Oral QHS   pantoprazole (PROTONIX) IV  40 mg Intravenous Q24H   patiromer  16.8 g Oral Daily   ondansetron **OR** ondansetron (ZOFRAN) IV  Assessment/ Plan:  [name] is a 50 y.o. 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  Received about 2 L of fluid during resuscitation.   End stage renal disease with hyperkalemia: missed outpatient treatment and now with hypertension and pulmonary edema with pleural effusion.   -patient received dialysis yesterday, with UF 1.5 L achieved.  -Next treatment scheduled for Wednesday   Anemia with chronic kidney disease:   Lab Results  Component Value Date   HGB 8.8 (L) 04/18/2021  Hemoglobin not at target, continue low-dose EPO with treatments  Secondary Hyperparathyroidism: with hyperphosphatemia   Lab Results  Component Value Date   CALCIUM 8.8 (L) 04/18/2021   CAION 0.76 (LL) 08/07/2019   PHOS 4.3 04/18/2021    Monitoring bone minerals during this admission.  4. Acute resp failure Intubated post code, subsequently extubated Continue ventila patient extubated and breathing comfortably off the ventilator support at this time and plans for weaning as per pulmonary/critical care.    LOS: 9 Larry Morrison 11/29/202210:39 AM

## 2021-04-19 NOTE — Progress Notes (Signed)
OT Cancellation Note  Patient Details Name: Larry Morrison MRN: [id number] DOB: 1971/03/22   Cancelled Treatment:    Reason Eval/Treat Not Completed: Patient declined, no reason specified. Consult received, chart reviewed. Spoke with PT. Pt declining therapy evaluations today. Will re-attempt next date.   Ardeth Perfect., MPH, MS, OTR/L ascom [phone] 04/19/21, 10:29 AM

## 2021-04-19 NOTE — Progress Notes (Signed)
PT Cancellation Note  Patient Details Name: Larry Morrison MRN: [id number] DOB: Aug 04, 1970   Cancelled Treatment:    Reason Eval/Treat Not Completed: Other (comment). Consult received and chart reviewed. Pt alert and acknowledges he is in hospital. He adamently (but politely) refuses evaluation this date. He reports he doesn't want any further follow up in hospital or at home. He reports he lives with brother and has all equipment needed. He is agreeable for therapist to attempt tomorrow. Will re-attempt next date.   Keland Peyton 04/19/2021, 10:06 AM Greggory Stallion, PT, DPT [phone]

## 2021-04-19 NOTE — Progress Notes (Signed)
Patient refused glucose checks and labs this morning.

## 2021-04-19 NOTE — Consult Note (Signed)
  Writer attempted to answer consult patient regarding "capacity." Approached patient in his room and introduced myself. Patient said loudly "Get the fuck out of my room." Writer made another attempt to engage patient, and patient repeated his original statement 2 more times. Writer sent secure chat to Dr. Ouida Sills, who replied that social work requires a capacity assessment for DSS custody. Will follow up

## 2021-04-19 NOTE — Progress Notes (Signed)
Patient refused CBG at 4pm. Said he did not want to be stuck

## 2021-04-20 DIAGNOSIS — I5043 Acute on chronic combined systolic (congestive) and diastolic (congestive) heart failure: Secondary | ICD-10-CM | POA: Diagnosis not present

## 2021-04-20 LAB — BASIC METABOLIC PANEL
Anion gap: 7 (ref 5–15)
BUN: 29 mg/dL — ABNORMAL HIGH (ref 6–20)
CO2: 26 mmol/L (ref 22–32)
Calcium: 8.2 mg/dL — ABNORMAL LOW (ref 8.9–10.3)
Chloride: 102 mmol/L (ref 98–111)
Creatinine, Ser: 5.33 mg/dL — ABNORMAL HIGH (ref 0.61–1.24)
GFR, Estimated: 12 mL/min — ABNORMAL LOW (ref >60)
Glucose, Bld: 90 mg/dL (ref 70–99)
Potassium: 3.5 mmol/L (ref 3.5–5.1)
Sodium: 135 mmol/L (ref 135–145)

## 2021-04-20 LAB — CBC
HCT: 26.9 % — ABNORMAL LOW (ref 39.0–52.0)
Hemoglobin: 8.4 g/dL — ABNORMAL LOW (ref 13.0–17.0)
MCH: 30.5 pg (ref 26.0–34.0)
MCHC: 31.2 g/dL (ref 30.0–36.0)
MCV: 97.8 fL (ref 80.0–100.0)
Platelets: 185 10*3/uL (ref 150–400)
RBC: 2.75 MIL/uL — ABNORMAL LOW (ref 4.22–5.81)
RDW: 17.4 % — ABNORMAL HIGH (ref 11.5–15.5)
WBC: 6.2 10*3/uL (ref 4.0–10.5)
nRBC: 0 % (ref 0.0–0.2)

## 2021-04-20 LAB — GLUCOSE, CAPILLARY
Glucose-Capillary: 94 mg/dL (ref 70–99)
Glucose-Capillary: 87 mg/dL (ref 70–99)
Glucose-Capillary: 158 mg/dL — ABNORMAL HIGH (ref 70–99)

## 2021-04-20 MED ORDER — SODIUM CHLORIDE 0.9% FLUSH
10.0000 mL | Freq: Two times a day (BID) | INTRAVENOUS | Status: DC
  Administered 2021-04-21: 10 mL

## 2021-04-20 MED ORDER — SODIUM CHLORIDE 0.9% FLUSH: 10.0000 mL | INTRAVENOUS | Status: DC | PRN

## 2021-04-20 MED ORDER — EPOETIN ALFA 4000 UNIT/ML IJ SOLN
INTRAMUSCULAR | Status: AC
  Filled 2021-04-20: qty 1

## 2021-04-20 NOTE — Evaluation (Addendum)
Physical Therapy Evaluation Patient Details Name: Burnie Therien MRN: [id number] DOB: 10/02/1970 Today's Date: 04/20/2021  History of Present Illness  Ohn Bostic is a 50 y.o. male with a PMH significant for ESRD on HD MWF, HTN, severe protein calorie malnutrition, elevated troponin, anemia, tobacco use disorder, CVA, TBI.  They presented from home to the ED on 04/09/2021 with SOB x1days. In the ED, it was found that they had volume overload due to missed HD sessions. They were treated with urgent dialysis as well as thoracentesis due to effusion. Nephrology was consulted. Patient underwent HD and experienced PEA arrest. He was subsequently intubated after ROSC. This occurred in setting of hyperkalemia and declining HD initially. Intubated from 11/21-11/26.  Clinical Impression  Pt is a grumpy 50 year old male who was admitted for acute on chronic CHF. History of noncompliance during hospitalizations with participation with therapy. Pt performs bed mobility with mod assist, however refuses to further participate and reports he will likely decline future attempts. Pt demonstrates deficits with strength/cognition/mobility. Would benefit from skilled PT to address above deficits and promote optimal return to PLOF; recommend transition to STR upon discharge from acute hospitalization. Pt will likely decline recommendation.      Recommendations for follow up therapy are one component of a multi-disciplinary discharge planning process, led by the attending physician.  Recommendations may be updated based on patient status, additional functional criteria and insurance authorization.  Follow Up Recommendations Skilled nursing-short term rehab (<3 hours/day)    Assistance Recommended at Discharge Frequent or constant Supervision/Assistance  Functional Status Assessment Patient has had a recent decline in their functional status and demonstrates the ability to make significant improvements in function in a  reasonable and predictable amount of time.  Equipment Recommendations   (TBD)    Recommendations for Other Services       Precautions / Restrictions Precautions Precautions: Fall Restrictions Weight Bearing Restrictions: No      Mobility  Bed Mobility Overal bed mobility: Needs Assistance             General bed mobility comments: able to bridge and perform movement to progress towards HOB. Needs mod assist to perform. Refused further mobility efforts despite max encouragement    Transfers                   General transfer comment: adamently refused    Ambulation/Gait                  Stairs            Wheelchair Mobility    Modified Rankin (Stroke Patients Only)       Balance                                             Pertinent Vitals/Pain Pain Assessment: No/denies pain    Home Living Family/patient expects to be discharged to:: Private residence Living Arrangements: Other relatives (lives with brother) Available Help at Discharge: Family Type of Home: Apartment Home Access: Level entry       Home Layout: One level Home Equipment: Conservation officer, nature (2 wheels);Wheelchair - manual Additional Comments: pt is poor historian, unsure of accuracy.    Prior Function Prior Level of Function : Patient poor historian/Family not available             Mobility Comments: pt reports he was Mod  I with RW prior to admission       Hand Dominance        Extremity/Trunk Assessment   Upper Extremity Assessment Upper Extremity Assessment: Generalized weakness (B UE grossly 4/5; Decreased ROM in B shoulders)    Lower Extremity Assessment Lower Extremity Assessment: Generalized weakness (B LE grossly 3+/5)       Communication   Communication: No difficulties  Cognition Arousal/Alertness: Awake/alert Behavior During Therapy: WFL for tasks assessed/performed Overall Cognitive Status: Within Functional Limits  for tasks assessed                                 General Comments: appears to be alert and oriented, however is poor historian.        General Comments      Exercises     Assessment/Plan    PT Assessment Patient needs continued PT services  PT Problem List Decreased strength;Decreased activity tolerance;Decreased mobility;Decreased cognition;Decreased safety awareness       PT Treatment Interventions Gait training;DME instruction;Therapeutic exercise    PT Goals (Current goals can be found in the Care Plan section)  Acute Rehab PT Goals Patient Stated Goal: to stay in the bed PT Goal Formulation: With patient Time For Goal Achievement: 05/04/21 Potential to Achieve Goals: Good Additional Goals Additional Goal #1: Pt will be able to perform bed mobility/transfers with supervision and safe technique    Frequency Min 2X/week   Barriers to discharge        Co-evaluation               AM-PAC PT "6 Clicks" Mobility  Outcome Measure Help needed turning from your back to your side while in a flat bed without using bedrails?: A Lot Help needed moving from lying on your back to sitting on the side of a flat bed without using bedrails?: A Lot Help needed moving to and from a bed to a chair (including a wheelchair)?: A Lot Help needed standing up from a chair using your arms (e.g., wheelchair or bedside chair)?: A Lot Help needed to walk in hospital room?: A Lot Help needed climbing 3-5 steps with a railing? : Total 6 Click Score: 11    End of Session   Activity Tolerance: Patient tolerated treatment well Patient left: in bed;with bed alarm set Nurse Communication: Mobility status PT Visit Diagnosis: Muscle weakness (generalized) (M62.81);Difficulty in walking, not elsewhere classified (R26.2)    Time: 4481-8563 PT Time Calculation (min) (ACUTE ONLY): 14 min   Charges:   PT Evaluation $PT Eval Low Complexity: 1 Low          Greggory Stallion,  PT, DPT [phone]   Cheskel Silverio 04/20/2021, 11:12 AM

## 2021-04-20 NOTE — TOC Initial Note (Signed)
Transition of Care [facility]) - Initial/Assessment Note    Patient Details  Name: Larry Morrison MRN: [id number] Date of Birth: January 09, 1971  Transition of Care [facility]) CM/SW Contact:    Candie Chroman, LCSW Phone Number: 04/20/2021, 1:10 PM  Clinical Narrative:  Readmission prevention screen complete. Patient only oriented to self. Called patient's mother. Patient lives home with his brother. She is agreeable to him returning home at discharge and is considering having him stay with her for a while. PCP is [facility]. Mother said he uses the [facility] bus to get to appointments. He uses [facility] to get to HD according to [facility] coworkers notes. Pharmacy is [facility]. Patient's sister typically picks up his medications. No home health prior to admission. He uses a wheelchair at home but also has a Kong, cane, and bedside commode. No further concerns. CSW encouraged patient's mother to contact CSW as needed. CSW will continue to follow patient and his mother for support and facilitate discharge once medically stable. Disposition plan pending at this time.              Expected Discharge Plan:  (TBD) Barriers to Discharge: Continued Medical Work up   Patient Goals and CMS Choice Patient states their goals for this hospitalization and ongoing recovery are:: Patient unable to state goals- intubated      Expected Discharge Plan and Services Expected Discharge Plan:  (TBD)   Discharge Planning Services: CM Consult Post Acute Care Choice:  (TBD) Living arrangements for the past 2 months: Single Family Home                 DME Arranged: N/A DME Agency: NA       HH Arranged: NA HH Agency: NA        Prior Living Arrangements/Services Living arrangements for the past 2 months: Single Family Home Lives with:: Siblings Patient language and need for interpreter reviewed:: Yes Do you feel safe going back to the place where you live?: Yes      Need for Family Participation in Patient  Care: Yes (Comment) Care giver support system in place?: Yes (comment) Current home services: DME Criminal Activity/Legal Involvement Pertinent to Current Situation/Hospitalization: No - Comment as needed  Activities of Daily Living      Permission Sought/Granted Permission sought to share information with : Case Manager, Other (comment) Permission granted to share information with : Yes, Verbal Permission Granted     Permission granted to share info w AGENCY: Department of social services involved        Emotional Assessment Appearance:: Appears stated age Attitude/Demeanor/Rapport: Unable to Assess Affect (typically observed): Unable to Assess Orientation: : Oriented to Self Alcohol / Substance Use: Tobacco Use Psych Involvement: No (comment)  Admission diagnosis:  Shortness of breath [R06.02] Hyperkalemia [E87.5] Pleural effusion [J90] Volume overload [E87.70] Patient Active Problem List   Diagnosis Date Noted   Acute respiratory failure with hypoxia (HCC)    Shortness of breath    Malnutrition of moderate degree 04/11/2021   Cardiac arrest ([place]) 04/11/2021   Acute on chronic combined systolic and diastolic CHF (congestive heart failure) ([place]) 04/09/2021   Noncompliance of patient with renal dialysis ([place]) 04/09/2021   Protein-calorie malnutrition, severe 03/01/2021   Arteriovenous fistula occlusion (HCC)    Pleural effusion 02/28/2021   Arteriovenous fistula thrombosis ([place]) 32/35/5732   Metabolic acidosis 20/25/4270   History of traumatic brain injury 09/07/2020   Cognitive impairment 62/37/6283   Metabolic encephalopathy 15/17/6160   ESRD  needing dialysis ([place]) 09/04/2020   Hypoglycemia 09/04/2020   Uremia 09/04/2020   COVID-19 virus infection 06/18/2020   Thrombocytopenia ([place]) 06/18/2020   Elevated troponin 06/18/2020   Hyperkalemia 06/13/2020   Anemia in ESRD (end-stage renal disease) ([place]) 09/17/2019   Essential hypertension 07/15/2019   Testicular  torsion    Pyuria 06/10/2016   Chronic pain following surgery or procedure 12/01/2015   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[place]) 12/01/2015   Tobacco use disorder 12/01/2015   ESRD (end stage renal disease) ([place]) 04/28/2014   Obstructed nephrostomy tube ([place]) 10/23/2013   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   Neurogenic bladder 02/09/1998   Urinary calculus 02/09/1998   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   PCP:  Physicians, [place]:   [facility] [address], [place] [address] Phone: [phone] Fax: [phone]     Social Determinants of Health (SDOH) Interventions    Readmission Risk Interventions Readmission Risk Prevention Plan 04/12/2021 09/08/2020  Transportation Screening Complete Complete  Social Work Consult for [place] Planning/Counseling - Complete  Palliative Care Screening - Complete  Medication Review Press photographer) Complete Complete  PCP or Specialist appointment within 3-5 days of discharge Complete -  [place] or [place] Complete -  [place] or [place] Pt Refusal Comments has in home care aids -  SW Recovery Care/Counseling Consult Not Complete -  SW Consult Not Complete Comments pt intubated -  Palliative Care Screening Not Complete -  Comments intubated -  Skilled Nursing Facility Not Complete -  SNF Comments intubated -  Some recent data might be hidden

## 2021-04-20 NOTE — TOC Progression Note (Signed)
Transition of Care [facility]) - Progression Note    Patient Details  Name: Larry Morrison MRN: [id number] Date of Birth: June 12, 1970  Transition of Care [facility]) CM/SW [place], LCSW Phone Number: 04/20/2021, 11:27 AM  Clinical Narrative:  Spoke to [place] social worker, Earl Lagos. No court date at this time. They are also unable to obtain protective order since patient still has family that wants to make decisions. Janett Billow asked that she be notified when he discharges.   Expected Discharge Plan: Skilled Nursing Facility Barriers to Discharge: Continued Medical Work up  Expected Discharge Plan and Services Expected Discharge Plan: [place]   Discharge Planning Services: CM Consult   Living arrangements for the past 2 months: Apartment                 DME Arranged: N/A DME Agency: NA       HH Arranged: NA HH Agency: NA         Social Determinants of Health (SDOH) Interventions    Readmission Risk Interventions Readmission Risk Prevention Plan 04/12/2021 09/08/2020  Transportation Screening Complete Complete  Social Work Consult for [place] Planning/Counseling - Complete  Palliative Care Screening - Complete  Medication Review Press photographer) Complete Complete  PCP or Specialist appointment within 3-5 days of discharge Complete -  [place] or [place] Complete -  [place] or [place] Pt Refusal Comments has in home care aids -  SW Recovery Care/Counseling Consult Not Complete -  SW Consult Not Complete Comments pt intubated -  Palliative Care Screening Not Complete -  Comments intubated -  Skilled Nursing Facility Not Complete -  SNF Comments intubated -  Some recent data might be hidden

## 2021-04-20 NOTE — Progress Notes (Signed)
Patient with scheduled 3 hr. HD session, via right chest CVC. Patient tolerates Tx well, without incident. Initiation of Tx on delay due to machine difficulties. Patient impatiently waited for staff to remedy issue. CVC functioned adequately, maintain the prescribed BFR.  UF target unmet due to the early termination of session by patient. Fluid removal 756. Patient was encouraged to comply with care being provided, including taking medication. Report was given to primary nurse and patient returned to floor.

## 2021-04-20 NOTE — Progress Notes (Signed)
Inpatient Rehab Admissions Coordinator:   Per PT recommendations patient was screened for CIR candidacy by Clemens Catholic, MS, CCC-SLP . At this time, Pt. Has been refusing most therapy and would not agree to attempt transfers during most recent PT session. I do not think that Pt. Would tolerate or participate in intensive rehab on CIR at this time so I will not pursue consult   Pt. may have potential to progress to becoming a potential CIR candidate, so CIR admissions team will follow and monitor for progress and participation with therapies and place consult order if Pt. appears to be an appropriate candidate. Please contact me with any questions.    Clemens Catholic, [place], [place] Admissions Coordinator  [phone] ([place]) [phone] (office)

## 2021-04-20 NOTE — Evaluation (Signed)
Occupational Therapy Evaluation Patient Details Name: Larry Morrison MRN: [id number] DOB: 01-26-1971 Today's Date: 04/20/2021   History of Present Illness Larry Morrison is a 50 y.o. male with a PMH significant for ESRD on HD MWF, HTN, severe protein calorie malnutrition, elevated troponin, anemia, tobacco use disorder, CVA, TBI.  They presented from home to the ED on 04/09/2021 with SOB x1days. In the ED, it was found that they had volume overload due to missed HD sessions. They were treated with urgent dialysis as well as thoracentesis due to effusion. Nephrology was consulted. Patient underwent HD and experienced PEA arrest. He was subsequently intubated after ROSC. This occurred in setting of hyperkalemia and declining HD initially. Intubated from 11/21-11/26.   Clinical Impression   Pt seen for OT evaluation this date. Upon arrival to room, pt awake in bed. Pt observed to have food crumbs on clothing and bed sheets, and was agreeable to assist from this author for clothing and bed linen change. Pt currently requires MIN GUARD for rolling in bed and MIN A for pulling self toward [facility] with use of bedrails d/t decreased strength and activity tolerance. Pt refused further mobility, including sitting EOB for seated ADLs. Pt required SET-UP assist for bed-level UB dressing and SET-UP assist to wash face with washcloth. During session, pt pleasantly answering PLOF questions. Pt reports he is mod-independent with RW for functional mobility and ADLs at baseline. Prior to admission, pt was living with his brother in a 1-level apartment with level entry. Pt would benefit from additional skilled OT services to maximize return to PLOF and minimize risk of future falls, injury, caregiver burden, and readmission. Upon discharge, recommend SNF.         Recommendations for follow up therapy are one component of a multi-disciplinary discharge planning process, led by the attending physician.  Recommendations may be  updated based on patient status, additional functional criteria and insurance authorization.   Follow Up Recommendations  Skilled nursing-short term rehab (<3 hours/day)    Assistance Recommended at Discharge Frequent or constant Supervision/Assistance  Functional Status Assessment  Patient has had a recent decline in their functional status and/or demonstrates limited ability to make significant improvements in function in a reasonable and predictable amount of time  Equipment Recommendations  Other (comment) (defer to next venue of care)       Precautions / Restrictions Precautions Precautions: Fall Restrictions Weight Bearing Restrictions: No      Mobility Bed Mobility Overal bed mobility: Needs Assistance Bed Mobility: Rolling Rolling: Min guard         General bed mobility comments: With use of bedrails & increased time/effort, pt able to roll left & right for bed linent change. With feet held, pt able to bridge and use UE to pull self toward [facility]. Pt refused further mobility    Transfers                   General transfer comment: refused          ADL either performed or assessed with clinical judgement   ADL Overall ADL's : Needs assistance/impaired     Grooming: Wash/dry face;Supervision/safety;Set up;Bed level           Upper Body Dressing : Supervision/safety;Set up;Sitting Upper Body Dressing Details (indicate cue type and reason): to don/doff hospital gown                          Pertinent Vitals/Pain Pain Assessment: No/denies  pain        Extremity/Trunk Assessment Upper Extremity Assessment Upper Extremity Assessment: Generalized weakness   Lower Extremity Assessment Lower Extremity Assessment: Generalized weakness       Communication Communication Communication: No difficulties   Cognition Arousal/Alertness: Awake/alert Behavior During Therapy: WFL for tasks assessed/performed Overall Cognitive Status: Within  Functional Limits for tasks assessed                                 General Comments: Pt appears to be alert and oriented, however difficult to formally assess d/t pt with mumbled speech and labile mood                Home Living Family/patient expects to be discharged to:: Private residence Living Arrangements: Other relatives (lives with brother) Available Help at Discharge: Family Type of Home: Apartment Home Access: Level entry     Home Layout: One level     Bathroom Shower/Tub: Tub/shower unit         Home Equipment: Conservation officer, nature (2 wheels);Wheelchair - manual;Shower seat;Grab bars - tub/shower   Additional Comments: pt is poor historian, unsure of accuracy.      Prior Functioning/Environment Prior Level of Function : Patient poor historian/Family not available             Mobility Comments: pt reports he was Mod I with RW prior to admission ADLs Comments: pt reports he was independent with ADLs prior to admission. Reports that brother assists with IADLs        OT Problem List: Decreased strength;Decreased activity tolerance;Impaired balance (sitting and/or standing)      OT Treatment/Interventions: Self-care/ADL training;Therapeutic exercise;Energy conservation;DME and/or AE instruction;Therapeutic activities;Patient/family education;Balance training    OT Goals(Current goals can be found in the care plan section) Acute Rehab OT Goals Patient Stated Goal: to go home OT Goal Formulation: With patient Time For Goal Achievement: 05/04/21 Potential to Achieve Goals: Fair ADL Goals Pt Will Perform Upper Body Dressing: with supervision;with set-up;sitting Pt Will Perform Lower Body Dressing: with mod assist;sitting/lateral leans Pt Will Transfer to Toilet: with mod assist;stand pivot transfer;bedside commode  OT Frequency: Min 1X/week    AM-PAC OT "6 Clicks" Daily Activity     Outcome Measure Help from another person eating meals?:  None Help from another person taking care of personal grooming?: A Little Help from another person toileting, which includes using toliet, bedpan, or urinal?: A Lot Help from another person bathing (including washing, rinsing, drying)?: A Lot Help from another person to put on and taking off regular upper body clothing?: A Little Help from another person to put on and taking off regular lower body clothing?: A Lot 6 Click Score: 16   End of Session Equipment Utilized During Treatment: Oxygen Nurse Communication: Mobility status  Activity Tolerance: Patient tolerated treatment well Patient left: in bed;with call bell/phone within reach;with bed alarm set;with nursing/sitter in room  OT Visit Diagnosis: Muscle weakness (generalized) (M62.81)                Time: 0940-1001 OT Time Calculation (min): 21 min Charges:  OT General Charges $OT Visit: 1 Visit OT Evaluation $OT Eval Moderate Complexity: 1 Mod OT Treatments $Self Care/Home Management : 8-22 mins  Fredirick Maudlin, OTR/L [place]

## 2021-04-20 NOTE — Progress Notes (Signed)
PROGRESS NOTE  Larry Morrison  DOB: December 02, 1970  PCP: [facility] HAL:[id number]  DOA: 04/09/2021  LOS: 10 days  Hospital Day: 62  Chief Complaint  Patient presents with   Shortness of Breath    Brief narrative: Larry Morrison is a 50 y.o. male with PMH significant for ESRD on HD MWF, HTN, severe protein calorie malnutrition, elevated troponin, anemia, tobacco use disorder, CVA, TBI. Patient presented from home to the ED on 04/09/2021 with dyspnea for a day after missing dialysis for several sessions.   In the ED, he was noted to be volume overloaded, severe electrolyte abnormalities and cocaine positive nephrology was consulted. He underwent urgent dialysis as well as thoracentesis. On 11/21, during dialysis session, patient had PEA arrest, ROSC achieved, required intubation. He was transferred to ICU, subsequently extubated and transferred to [facility].  Subjective: Patient was seen and examined this morning. Middle-aged African-American male.  Opens eyes on verbal command.  Not in distress.  Mumbling.  Not agitated at the time of my evaluation  Assessment/Plan: PEA arrest Acute hypoxic respiratory failure -In the setting of hyperkalemia.  ROSC achieved.  Required intubation, subsequently extubated and currently on room air.  ESRD HD MWF -Missed dialysis prior to presentation. -On dialysis schedule per nephrology at this time.  Chronic severe systolic CHF Essential hypertension -Currently on Coreg 25 mg twice daily.   Anemia of chronic disease -Hemoglobin between 8-9. stable.  EPO with dialysis Recent Labs    04/15/21 0410 04/16/21 0352 04/17/21 0437 04/18/21 0556 04/20/21 0510  HGB 8.2* 8.5* 9.1* 8.8* 8.4*  MCV 99.3 99.3 97.6 96.2 97.8   Polysubstance use -Cocaine positive on admission.    Severe protein calorie malnutrition generalized physical deconditioning -monitor for refeeding syndrome -nutritional supplements -TOC consult for SNF  Mobility:  Encourage ambulation Living condition: Was living at home Goals of care:   Code Status: Full Code  Nutritional status: Body mass index is 20.56 kg/m. Nutrition Problem: Moderate Malnutrition Etiology: chronic illness (ESRD on HD) Signs/Symptoms: mild fat depletion, mild muscle depletion, moderate muscle depletion Diet:  Diet Order             DIET DYS 3 Room service appropriate? Yes with Assist; Fluid consistency: Thin  Diet effective now                  DVT prophylaxis:  heparin injection 5,000 Units Start: 04/09/21 2200 Place TED hose Start: 04/09/21 1830   Antimicrobials: None currently Fluid: None Consultants: Nephrology Family Communication: None at bedside  Status is: Inpatient  Remains inpatient appropriate because: Unclear disposition plan at this time.  Social work involved.  Dispo: The patient is from: Home              Anticipated d/c is to: Disposition plan unclear at this time              Patient currently is not medically stable to d/c.   Difficult to place patient No     Infusions:    Scheduled Meds:  (feeding supplement) PROSource Plus  30 mL Oral TID BM   vitamin C  500 mg Per Tube BID   carvedilol  25 mg Oral BID WC   Chlorhexidine Gluconate Cloth  6 each Topical Q0600   epoetin (EPOGEN/PROCRIT) injection  4,000 Units Intravenous Q M,W,F-HD   feeding supplement  1 Container Oral TID BM   heparin  5,000 Units Subcutaneous Q8H   multivitamin  1 tablet Oral QHS   pantoprazole  40 mg Oral Daily    PRN meds: ondansetron **OR** ondansetron (ZOFRAN) IV   Antimicrobials: Anti-infectives (From admission, onward)    Start     Dose/Rate Route Frequency Ordered Stop   04/13/21 2200  piperacillin-tazobactam (ZOSYN) IVPB 2.25 g        2.25 g 100 mL/hr over 30 Minutes Intravenous Every 8 hours 04/13/21 1950 04/17/21 2245   04/13/21 2045  vancomycin (VANCOREADY) IVPB 500 mg/100 mL        500 mg 100 mL/hr over 60 Minutes Intravenous  Once  04/13/21 1950 04/14/21 0803   04/12/21 1115  doxycycline (VIBRAMYCIN) 100 mg in sodium chloride 0.9 % 250 mL IVPB        100 mg 125 mL/hr over 120 Minutes Intravenous Every 12 hours 04/12/21 1022 04/17/21 0020   04/11/21 2315  ceFEPIme (MAXIPIME) 1 g in sodium chloride 0.9 % 100 mL IVPB  Status:  Discontinued        1 g 200 mL/hr over 30 Minutes Intravenous Daily-1800 04/11/21 2216 04/12/21 1022   04/11/21 2315  vancomycin (VANCOREADY) IVPB 1000 mg/200 mL        1,000 mg 200 mL/hr over 60 Minutes Intravenous  Once 04/11/21 2218 04/12/21 0045       Objective: Vitals:   04/20/21 0754 04/20/21 1615  BP: 131/81   Pulse: 64   Resp: 20   Temp: 97.9 F (36.6 C) (!) 97.4 F (36.3 C)  SpO2: 100%     Intake/Output Summary (Last 24 hours) at 04/20/2021 1624 Last data filed at 04/20/2021 1000 Gross per 24 hour  Intake 240 ml  Output --  Net 240 ml   Filed Weights   04/15/21 0134 04/15/21 1728 04/18/21 1330  Weight: 51.3 kg 51.3 kg 51 kg   Weight change:  Body mass index is 20.56 kg/m.   Physical Exam: General exam: Middle-aged African-American male.  Not in physical distress Skin: No rashes, lesions or ulcers. HEENT: Atraumatic, normocephalic, no obvious bleeding Lungs: Clear to auscultation bilaterally CVS: Regular rate and rhythm, no murmur GI/Abd soft, nontender, nondistended, bowel sounds present CNS: Alert, awake, mumbling, unable to have a conversation with me this morning Psychiatry: Depressed look Extremities: No pedal edema, no calf tenderness  Data Review: I have personally reviewed the laboratory data and studies available.  F/u labs ordered Unresulted Labs (From admission, onward)     Start     Ordered   04/18/21 0623  Basic metabolic panel  Daily,   R     Question:  Specimen collection method  Answer:  Unit=Unit collect   04/17/21 1959            Signed, Terrilee Croak, MD [facility] 04/20/2021

## 2021-04-20 NOTE — Progress Notes (Signed)
[facility]  ROUNDING NOTE   Subjective:   Patient resting quietly Alert and oriented Awaiting breakfast Weaned to room air States he does not want dialysis today, too tired   Objective:  Vital signs in last 24 hours:  Temp:  [97.3 F (36.3 C)-97.9 F (36.6 C)] 97.9 F (36.6 C) (11/30 0754) Pulse Rate:  [62-76] 64 (11/30 0754) Resp:  [18-20] 20 (11/30 0754) BP: (120-135)/(81-93) 131/81 (11/30 0754) SpO2:  [99 %-100 %] 100 % (11/30 0754)  Weight change:  Filed Weights   04/15/21 0134 04/15/21 1728 04/18/21 1330  Weight: 51.3 kg 51.3 kg 51 kg    Intake/Output: I/O last 3 completed shifts: In: 42.5 [I.V.:42.5] Out: 50 [Urine:50]   Intake/Output this shift:  Total I/O In: 240 [P.O.:240] Out: -   Physical Exam: General: No acute distress  Head: Normocephalic, atraumatic, hearing intact  Lungs:  Diminished, normal effort  Heart: Regular   Abdomen:  Soft, nontender, bowel sounds present  Extremities: no peripheral edema.  Neurologic: Awake, alert, follows simple commands  Skin: Warm   Access: Right IJ permcath  Right nephrostomy  Basic Metabolic Panel: Recent Labs  Lab 04/14/21 0515 04/15/21 0410 04/16/21 0352 04/17/21 0437 04/18/21 0556 04/19/21 1130 04/20/21 0510  NA 133* 132* 135 131* 136 137 135  K 3.8 4.2 4.0 4.1 4.2 3.4* 3.5  CL 98 98 97* 96* 102 100 102  CO2 27 25 29 25  21* 27 26  GLUCOSE 85 80 90 81 90 109* 90  BUN 30* 41* 20 35* 42* 23* 29*  CREATININE 4.85* 5.27* 3.24* 4.33* 5.62* 4.35* 5.33*  CALCIUM 7.7* 7.4* 8.2* 8.4* 8.8* 8.6* 8.2*  MG 1.7  --  1.8 1.9 2.0  --   --   PHOS 3.9 5.1* 3.4  3.5 3.7  3.6 4.3  --   --      Liver Function Tests: Recent Labs  Lab 04/14/21 0515 04/15/21 0410 04/16/21 0352 04/17/21 0437  ALBUMIN 2.0* 1.8* 2.4* 2.4*    No results for input(s): LIPASE, AMYLASE in the last 168 hours. No results for input(s): AMMONIA in the last 168 hours.  CBC: Recent Labs  Lab 04/15/21 0410  04/16/21 0352 04/17/21 0437 04/18/21 0556 04/20/21 0510  WBC 4.4 5.2 7.9 7.5 6.2  HGB 8.2* 8.5* 9.1* 8.8* 8.4*  HCT 26.7* 26.7* 28.5* 27.6* 26.9*  MCV 99.3 99.3 97.6 96.2 97.8  PLT 141* 153 187 206 185     Cardiac Enzymes: No results for input(s): CKTOTAL, CKMB, CKMBINDEX, TROPONINI in the last 168 hours.  BNP: Invalid input(s): POCBNP  CBG: Recent Labs  Lab 04/18/21 0740 04/18/21 1519 04/19/21 0916 04/19/21 1143 04/20/21 0752  GLUCAP 88 101* 76 113* 94     Microbiology: Results for orders placed or performed during the hospital encounter of 04/09/21  Resp Panel by RT-PCR (Flu A&B, Covid) Nasopharyngeal Swab     Status: None   Collection Time: 04/09/21  7:29 PM   Specimen: Nasopharyngeal Swab; Nasopharyngeal(NP) swabs in vial transport medium  Result Value Ref Range Status   SARS Coronavirus 2 by RT PCR NEGATIVE NEGATIVE Final    Comment: (NOTE) SARS-CoV-2 target nucleic acids are NOT DETECTED.  The SARS-CoV-2 RNA is generally detectable in upper respiratory specimens during the acute phase of infection. The lowest concentration of SARS-CoV-2 viral copies this assay can detect is 138 copies/mL. A negative result does not preclude SARS-Cov-2 infection and should not be used as the sole basis for treatment or other patient management  decisions. A negative result may occur with  improper specimen collection/handling, submission of specimen other than nasopharyngeal swab, presence of viral mutation(s) within the areas targeted by this assay, and inadequate number of viral copies(<138 copies/mL). A negative result must be combined with clinical observations, patient history, and epidemiological information. The expected result is Negative.  Fact Sheet for Patients:  EntrepreneurPulse.com.au  Fact Sheet for Healthcare Providers:  IncredibleEmployment.be  This test is no t yet approved or cleared by the [place] FDA and   has been authorized for detection and/or diagnosis of SARS-CoV-2 by FDA under an Emergency Use Authorization (EUA). This EUA will remain  in effect (meaning this test can be used) for the duration of the COVID-19 declaration under Section 564(b)(1) of the Act, 21 U.S.C.section 360bbb-3(b)(1), unless the authorization is terminated  or revoked sooner.       Influenza A by PCR NEGATIVE NEGATIVE Final   Influenza B by PCR NEGATIVE NEGATIVE Final    Comment: (NOTE) The Xpert Xpress SARS-CoV-2/FLU/RSV plus assay is intended as an aid in the diagnosis of influenza from Nasopharyngeal swab specimens and should not be used as a sole basis for treatment. Nasal washings and aspirates are unacceptable for Xpert Xpress SARS-CoV-2/FLU/RSV testing.  Fact Sheet for Patients: EntrepreneurPulse.com.au  Fact Sheet for Healthcare Providers: IncredibleEmployment.be  This test is not yet approved or cleared by the [place] FDA and has been authorized for detection and/or diagnosis of SARS-CoV-2 by FDA under an Emergency Use Authorization (EUA). This EUA will remain in effect (meaning this test can be used) for the duration of the COVID-19 declaration under Section 564(b)(1) of the Act, 21 U.S.C. section 360bbb-3(b)(1), unless the authorization is terminated or revoked.  Performed at [facility], [address] [place], [place] [address]   MRSA Next Gen by PCR, Nasal     Status: Abnormal   Collection Time: 04/11/21 10:23 PM   Specimen: Nasal Mucosa; Nasal Swab  Result Value Ref Range Status   MRSA by PCR Next Gen DETECTED (A) NOT DETECTED Final    Comment: RESULT CALLED TO, READ BACK BY AND VERIFIED WITH: TESSYAMMA THOMAS @0006  04/12/21 RH (NOTE) The GeneXpert MRSA Assay (FDA approved for NASAL specimens only), is one component of a comprehensive MRSA colonization surveillance program. It is not intended to diagnose MRSA infection nor to  guide or monitor treatment for MRSA infections. Test performance is not FDA approved in patients less than 101 years old. Performed at [facility], [address], [place], [place] [address]   Urine Culture     Status: Abnormal   Collection Time: 04/11/21 10:30 PM   Specimen: In/Out Cath Urine  Result Value Ref Range Status   Specimen Description   Final    IN/OUT CATH URINE Performed at [facility], [address], [place], [place] [address]    Special Requests   Final    NONE Performed at [facility], [address] [place], [place] [address]    Culture (A)  Final    >=100,000 COLONIES/mL METHICILLIN RESISTANT STAPHYLOCOCCUS AUREUS   Report Status 04/16/2021 FINAL  Final   Organism ID, Bacteria METHICILLIN RESISTANT STAPHYLOCOCCUS AUREUS (A)  Final      Susceptibility   Methicillin resistant staphylococcus aureus - MIC*    CIPROFLOXACIN >=8 RESISTANT Resistant     GENTAMICIN <=0.5 SENSITIVE Sensitive     NITROFURANTOIN <=16 SENSITIVE Sensitive     OXACILLIN >=4 RESISTANT Resistant     TETRACYCLINE <=1 SENSITIVE  Sensitive     VANCOMYCIN 1 SENSITIVE Sensitive     TRIMETH/SULFA <=10 SENSITIVE Sensitive     CLINDAMYCIN >=8 RESISTANT Resistant     RIFAMPIN <=0.5 SENSITIVE Sensitive     Inducible Clindamycin NEGATIVE Sensitive     * >=100,000 COLONIES/mL METHICILLIN RESISTANT STAPHYLOCOCCUS AUREUS  CULTURE, BLOOD (ROUTINE X 2) w Reflex to ID Panel     Status: None   Collection Time: 04/11/21 10:54 PM   Specimen: BLOOD LEFT HAND  Result Value Ref Range Status   Specimen Description BLOOD LEFT HAND  Final   Special Requests   Final    BOTTLES DRAWN AEROBIC ONLY Blood Culture adequate volume   Culture   Final    NO GROWTH 5 DAYS Performed at [facility], [address] [place], [place] [address]    Report Status 04/16/2021 FINAL  Final  CULTURE, BLOOD (ROUTINE X 2) w Reflex to ID Panel     Status: None   Collection Time: 04/11/21  11:05 PM   Specimen: BLOOD LEFT HAND  Result Value Ref Range Status   Specimen Description BLOOD LEFT HAND  Final   Special Requests   Final    BOTTLES DRAWN AEROBIC ONLY Blood Culture adequate volume   Culture   Final    NO GROWTH 5 DAYS Performed at [facility], [address], [place], [place] [address]    Report Status 04/16/2021 FINAL  Final  Culture, Respiratory w Gram Stain     Status: None   Collection Time: 04/12/21 12:31 AM   Specimen: Tracheal Aspirate; Respiratory  Result Value Ref Range Status   Specimen Description   Final    TRACHEAL ASPIRATE Performed at [facility], [address] [place], [place] [address]    Special Requests   Final    NONE Performed at [facility], [place], [place] [address]    Gram Stain   Final    FEW SQUAMOUS EPITHELIAL CELLS PRESENT FEW WBC SEEN FEW GRAM POSITIVE RODS MODERATE GRAM POSITIVE COCCI    Culture   Final    ABUNDANT Consistent with normal respiratory flora. No Pseudomonas species isolated Performed at [facility] [address], [place], [place] [address]    Report Status 04/14/2021 FINAL  Final    Coagulation Studies: No results for input(s): LABPROT, INR in the last 72 hours.   Urinalysis: No results for input(s): COLORURINE, LABSPEC, PHURINE, GLUCOSEU, HGBUR, BILIRUBINUR, KETONESUR, PROTEINUR, UROBILINOGEN, NITRITE, LEUKOCYTESUR in the last 72 hours.  Invalid input(s): APPERANCEUR     Imaging: No results found.   Medications:      (feeding supplement) PROSource Plus  30 mL Oral TID BM   vitamin C  500 mg Per Tube BID   carvedilol  25 mg Oral BID WC   Chlorhexidine Gluconate Cloth  6 each Topical Q0600   epoetin (EPOGEN/PROCRIT) injection  4,000 Units Intravenous Q M,W,F-HD   feeding supplement  1 Container Oral TID BM   heparin  5,000 Units Subcutaneous Q8H   multivitamin  1 tablet Oral QHS   pantoprazole  40 mg Oral Daily   ondansetron  **OR** ondansetron (ZOFRAN) IV  Assessment/ Plan:  [name] is a 50 y.o. 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  Received about 2 L of fluid during resuscitation.   End stage renal disease with hyperkalemia: missed outpatient treatment and now with hypertension and pulmonary edema with pleural effusion.   -Scheduled for dialysis later today, states he wants to wait until tomorrow. Patient requesting to discharge tomorrow. Discussed with patient that dialysis was held today, it may hold up discharge tomorrow. Patient agreeable for dialysis today. -Next treatment scheduled for Friday   Anemia with chronic kidney disease:   Lab Results  Component Value Date   HGB 8.4 (L) 04/20/2021  Low-dose EPO with treatments  Secondary Hyperparathyroidism: with hyperphosphatemia   Lab Results  Component Value Date   CALCIUM 8.2 (L) 04/20/2021   CAION 0.76 (LL) 08/07/2019   PHOS 4.3 04/18/2021    Phosphorus at goal, corrected calcium 9.5.  Monitoring  4. Acute resp failure Intubated post code, subsequently extubated Currently weaned to room air, breathing comfortably    LOS: 10 Larry Morrison 11/30/202210:24 AM

## 2021-04-20 NOTE — Progress Notes (Signed)
Speech Language Pathology Treatment: Dysphagia  Patient Details Name: Larry Morrison MRN: [id number] DOB: June 04, 1970 Today's Date: 04/20/2021 Time: 1225-1310 SLP Time Calculation (min) (ACUTE ONLY): 45 min  Assessment / Plan / Recommendation Clinical Impression  Pt seen for ongoing assessment of swallowing; trials to upgrade his diet if appropriate. This Clinician was alerted that pt had called the Dining Services/Kitchen and rudely c/o not being able to have a "hamburger". Noted similar agitated behavior and refusals. He was awake/alert, verbally responsive stating to this Clinician that he was "leaving tomorrow" and "no one is stopping me". Unsure of pt's Cognitive status -- he appears to demonstrate poor insight and awareness as well as impulsivity. He refused to sit up in bed to eat/drink despite education on need of such from this Clinician to reduce risk for choking/aspiration. Pt has poor Dentition; not wearing the [place] O2(laying in bed).   Pt explained general aspiration precautions and but did not agree to the need for following them especially sitting upright for all oral intake. The pillow was supported behind the head for more head forward position. He was given tray setup then fed himself trials of soft solids and thin liquids via straw. NO overt clinical s/s of aspiration were noted w/ any consistency; respiratory status remained calm and unlabored, vocal quality clear b/t trials. Pt often talked w/ his mouth full and put in multiple bites -- Large boluses each time. He slowed down and took Smaller bites towards end of trials of meatloaf and potatoes After the grilled cheese sandwich. Oral phase appeared grossly [facility] for bolus management and oral clearing of the Large boluses. Attempted education on need to eat/drink slowly using Small bites/sips; pt did not acknowledge the education given by this Clinician. NSG denied any deficits swallowing w/ current Minced diet.   Pt appears more at risk for  choking d/t impulsivity of taking large bites of food at one time. When he slows down and uses smaller bites, he appears at reduced risk for aspiration following general aspiration precautions. Recommend a Mech soft diet for the Cut meats and moisten foods; Thin liquids. Recommend general aspiration precautions; Supervision during meals for aspiration precautions and follow through. Pills Whole vs Crushed in Puree; tray setup and positioning assistance for meals. ST services will sign off at this time w/ MD to reconsult if any new needs while admitted. NSG/CM updated. Precautions posted at bedside.       HPI HPI: Per 32 H&P 04/09/21 Larry Morrison is a 50 y.o. male with medical history significant for end-stage renal disease on hemodialysis, frequent noncompliance with hemodialysis, hypertension, severe protein calorie malnutrition, elevated troponin, history of pleural effusion, anemia due to chronic kidney disease, tobacco use disorder, history of CVA, history of TBI, who presents emergency department for chief concerns of shortness of breath.     He reports that shortness of breath started on a.m. on day of admission.  He reports this feels similar to previous episodes of missing dialysis session.  He states that he would never miss dialysis sessions again.  He states he missed it because he had to see his mother yesterday.     Bedside shortness of breath with exertion and laying flat, he denies any chest pain, double vision, headaches, abdominal pain, syncope, loss of consciousness, changes to his lower extremity swelling.    He does endorse diarrhea that has been ongoing for the last 2 to 3 days.  He denies any recent antibiotic use and/or known sick contacts.  At bedside he is able to tell me his name, age, current location of hospital.   Pt with PEA cardiac arrest on 11/21. Intubated 11/21-11/27 (self-extubated).  HEAD CT: Postsurgical changes with large area of old infarct and  encephalomalacia  involving the left posterior parietal and occipital  lobes. No acute abnormality.      SLP Plan  All goals met      Recommendations for follow up therapy are one component of a multi-disciplinary discharge planning process, led by the attending physician.  Recommendations may be updated based on patient status, additional functional criteria and insurance authorization.    Recommendations  Diet recommendations: Dysphagia 3 (mechanical soft);Thin liquid Liquids provided via: Cup;Straw Medication Administration: Whole meds with puree (vs Crushed in Puree) Supervision: Patient able to self feed;Intermittent supervision to cue for compensatory strategies (setup, supervision) Compensations: Minimize environmental distractions;Slow rate;Small sips/bites;Follow solids with liquid;Lingual sweep for clearance of pocketing Postural Changes and/or Swallow Maneuvers: Out of bed for meals;Seated upright 90 degrees;Upright 30-60 min after meal                General recommendations:  (Dietician f/u) Oral Care Recommendations: Oral care BID;Oral care before and after PO;Staff/trained caregiver to provide oral care Follow Up Recommendations: No SLP follow up (could consider if wanting to address Cognitive needs in the future) Assistance recommended at discharge: Intermittent Supervision/Assistance SLP Visit Diagnosis: Dysphagia, oral phase (R13.11) (poor insight and engagement; Cognitive decline) Plan: All goals met       [place], [place], CCC-SLP Speech Language Pathologist Rehab Services [phone] [facility]  04/20/2021, 3:44 PM

## 2021-04-21 DIAGNOSIS — I5043 Acute on chronic combined systolic (congestive) and diastolic (congestive) heart failure: Secondary | ICD-10-CM | POA: Diagnosis not present

## 2021-04-21 LAB — GLUCOSE, CAPILLARY: Glucose-Capillary: 111 mg/dL — ABNORMAL HIGH (ref 70–99)

## 2021-04-21 MED ORDER — NEPRO/CARBSTEADY PO LIQD: 237.0000 mL | Freq: Two times a day (BID) | ORAL | 0 refills | Status: DC

## 2021-04-21 MED ORDER — CARVEDILOL 25 MG PO TABS: 25.0000 mg | ORAL_TABLET | Freq: Two times a day (BID) | ORAL | 2 refills | Status: DC

## 2021-04-21 MED ORDER — ASCORBIC ACID 500 MG PO TABS
500.0000 mg | ORAL_TABLET | Freq: Two times a day (BID) | ORAL | Status: DC
  Administered 2021-04-21: 500 mg via ORAL
  Filled 2021-04-21: qty 1

## 2021-04-21 MED ORDER — NEPRO/CARBSTEADY PO LIQD: 237.0000 mL | Freq: Two times a day (BID) | ORAL | Status: DC

## 2021-04-21 NOTE — Progress Notes (Signed)
Patient has been refusing to participate in care at most times as well as verbally abusive to staff.  Reminded to be respectful and encouraged to participate and get involved with his care but patient kept cursing and asking staff to leave his room.

## 2021-04-21 NOTE — Progress Notes (Signed)
Patient has stated repeatedly that he wants to go home.  He states that he lives in his apartment with his younger brother, "he helps me get around".  Dr. Pietro Cassis made aware of patient's wish to go home.

## 2021-04-21 NOTE — Progress Notes (Signed)
Occupational Therapy Treatment Patient Details Name: Larry Morrison MRN: [id number] DOB: 15-Aug-1970 Today's Date: 04/21/2021   History of present illness Larry Morrison is a 50 y.o. male with a PMH significant for ESRD on HD MWF, HTN, severe protein calorie malnutrition, elevated troponin, anemia, tobacco use disorder, CVA, TBI.  They presented from home to the ED on 04/09/2021 with SOB x1days. In the ED, it was found that they had volume overload due to missed HD sessions. They were treated with urgent dialysis as well as thoracentesis due to effusion. Nephrology was consulted. Patient underwent HD and experienced PEA arrest. He was subsequently intubated after ROSC. This occurred in setting of hyperkalemia and declining HD initially. Intubated from 11/21-11/26.   OT comments  Pt. Pt.'s mother, and sister were all present upon arrival. Pt. was preparing for discharge home with his mother. Pt./caregiver education was provided about Pt. ADL functional level, energy conservation strategies for the caregiver, and caregiver self care as pt.'s mother reports that she will be assisting him with his morning ADL care. Pt.'s mother reports a history of medical issues herself. Pt. required ModAx's 2 with transfers from the bed to the transport chair.  Pt. Could benefit from follow-up OT services upon discharge.   Recommendations for follow up therapy are one component of a multi-disciplinary discharge planning process, led by the attending physician.  Recommendations may be updated based on patient status, additional functional criteria and insurance authorization.    Follow Up Recommendations  Skilled nursing-short term rehab (<3 hours/day)    Assistance Recommended at Discharge Frequent or constant Supervision/Assistance  Equipment Recommendations  Other (comment)    Recommendations for Other Services      Precautions / Restrictions Precautions Precautions: Fall Restrictions Weight Bearing Restrictions:  No       Mobility Bed Mobility Overal bed mobility: Needs Assistance Bed Mobility: Supine to Sit Rolling: Mod assist              Transfers Overall transfer level: Needs assistance   Transfers: Bed to chair/wheelchair/BSC Sit to Stand: Mod assist                 Balance                                           ADL either performed or assessed with clinical judgement   ADL                                       Functional mobility during ADLs: Moderate assistance;+2 for physical assistance      Extremity/Trunk Assessment Upper Extremity Assessment Upper Extremity Assessment: Generalized weakness            Vision       Perception     Praxis      Cognition Arousal/Alertness: Awake/alert Behavior During Therapy: WFL for tasks assessed/performed Overall Cognitive Status: History of cognitive impairments - at baseline                                            Exercises     Shoulder Instructions       General Comments      Pertinent Vitals/ Pain  Pain Assessment: No/denies pain  Home Living                                          Prior Functioning/Environment              Frequency  Min 1X/week        Progress Toward Goals  OT Goals(current goals can now be found in the care plan section)  Progress towards OT goals: Progressing toward goals  Acute Rehab OT Goals Patient Stated Goal: To go home OT Goal Formulation: With patient Time For Goal Achievement: 05/04/21 Potential to Achieve Goals: Fair  Plan      Co-evaluation                 AM-PAC OT "6 Clicks" Daily Activity     Outcome Measure   Help from another person eating meals?: None Help from another person taking care of personal grooming?: A Little Help from another person toileting, which includes using toliet, bedpan, or urinal?: A Lot Help from another person bathing  (including washing, rinsing, drying)?: A Lot Help from another person to put on and taking off regular upper body clothing?: A Little Help from another person to put on and taking off regular lower body clothing?: A Lot 6 Click Score: 16    End of Session    OT Visit Diagnosis: Muscle weakness (generalized) (M62.81)   Activity Tolerance Patient tolerated treatment well   Patient Left in bed;with call bell/phone within reach;with bed alarm set;with nursing/sitter in room   Nurse Communication          Time: 7425-9563 OT Time Calculation (min): 31 min  Charges: OT General Charges $OT Visit: 1 Visit OT Treatments $Self Care/Home Management : 23-37 mins  Harrel Carina, MS, OTR/L   Harrel Carina 04/21/2021, 3:38 PM

## 2021-04-21 NOTE — TOC Transition Note (Signed)
Transition of Care [facility]) - CM/SW Discharge Note   Patient Details  Name: Larry Morrison MRN: [id number] Date of Birth: 07-27-1970  [facility] CM/SW Contact:  Candie Chroman, LCSW Phone Number: 04/21/2021, 2:16 PM   Clinical Narrative:  Patient has orders to discharge home today. Per MD, mother and sister are at the bedside and willing to take him home. Tried calling APS social worker and her supervisor but both voicemails are full. Will update if they call back. Notified HD coordinator of today's discharge. No further concerns. CSW signing off.  Final next level of care: Home/Self Care Barriers to Discharge: Barriers Resolved   Patient Goals and CMS Choice Patient states their goals for this hospitalization and ongoing recovery are:: Patient unable to state goals- intubated      Discharge Placement                    Patient and family notified of of transfer: 04/21/21  Discharge Plan and Services   Discharge Planning Services: CM Consult Post Acute Care Choice:  (TBD)          DME Arranged: N/A DME Agency: NA       HH Arranged: NA HH Agency: NA        Social Determinants of Health (SDOH) Interventions     Readmission Risk Interventions Readmission Risk Prevention Plan 04/12/2021 09/08/2020  Transportation Screening Complete Complete  Social Work Consult for [place] Planning/Counseling - Complete  Palliative Care Screening - Complete  Medication Review Press photographer) Complete Complete  PCP or Specialist appointment within 3-5 days of discharge Complete -  [place] or [place] Complete -  [place] or [place] Pt Refusal Comments has in home care aids -  SW Recovery Care/Counseling Consult Not Complete -  SW Consult Not Complete Comments pt intubated -  Palliative Care Screening Not Complete -  Comments intubated -  Skilled Nursing Facility Not Complete -  SNF Comments intubated -  Some recent data might be hidden

## 2021-04-21 NOTE — Progress Notes (Signed)
Nutrition Follow-up  DOCUMENTATION CODES:   Non-severe (moderate) malnutrition in context of chronic illness  INTERVENTION:   -D/c Prosource Plus due to poor acceptance -D/c Boost Breeze due to poor acceptance -Continue renal MVI -Nepro Shake po BID, each supplement provides 425 kcal and 19 grams protein  -Magic cup TID with meals, each supplement provides 290 kcal and 9 grams of protein   NUTRITION DIAGNOSIS:   Moderate Malnutrition related to chronic illness (ESRD on HD) as evidenced by mild fat depletion, mild muscle depletion, moderate muscle depletion.  Ongoing  GOAL:   Patient will meet greater than or equal to 90% of their needs  Progressing   MONITOR:   PO intake, Supplement acceptance, Diet advancement, Labs, Weight trends, Skin, I & O's  REASON FOR ASSESSMENT:   Malnutrition Screening Tool    ASSESSMENT:   50 y.o. 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  11/22- intubated 11/26- self-extubated 11/28- s/p BSE- advanced to dysphagia 2 diet with thin liquids 11/30- s/p BSE- advanced to dysphagia 3 diet with thin liquids  Reviewed I/O's: -76 ml x 24 hours and +1.5 L since admission  UOP: 200 ml x 24 hours  Pt has been refusing care (PT/OT, medications, and signing off HD early) and has been verbally abusive to staff.   Pt has been refusing Boost Breeze and Prosource supplements. He was just advanced to a dysphagia 3 diet with thin liquids. Noted meal completions 50-60%.   Per TOC notes, APS is pursuing guardianship. Awaiting safe discharge plan once medically stable to discharge.    Medications reviewed and include vitamin C.   Labs reviewed: CBGS: 76-158 (inpatient orders for  glycemic control are none).    Diet Order:   Diet Order             DIET DYS 3 Room service appropriate? Yes with Assist; Fluid consistency: Thin  Diet effective now                   EDUCATION NEEDS:   Education needs have been addressed  Skin:  Skin Assessment: Reviewed RN Assessment  Last BM:  04/21/21  Height:   Ht Readings from Last 1 Encounters:  04/16/21 5' 2.01" (1.575 m)    Weight:   Wt Readings from Last 1 Encounters:  04/18/21 51 kg    Ideal Body Weight:  53.6 kg  BMI:  Body mass index is 20.56 kg/m.  Estimated Nutritional Needs:   Kcal:  1850-2050  Protein:  90-105 grams  Fluid:  1000 ml + UOP    [name], RD, LDN, [place] Registered Dietitian II Certified Diabetes Care and Education Specialist Please refer to [facility] for RD and/or RD on-call/weekend/after hours pager

## 2021-04-21 NOTE — Progress Notes (Signed)
Patient's mother and sister are in the room.  Request to talk the doctor about plan of care.  Dr. Pietro Cassis made aware.

## 2021-04-21 NOTE — Progress Notes (Signed)
[facility]  ROUNDING NOTE   Subjective:   Patient seen laying in bed Larry Morrison about discharge today  Received dialysis yesterday   Objective:  Vital signs in last 24 hours:  Temp:  [97.4 F (36.3 C)] 97.4 F (36.3 C) (11/30 1615) Pulse Rate:  [68-114] 114 (11/30 1945) Resp:  [19-43] 30 (11/30 2000) BP: (98-157)/(59-92) 147/92 (11/30 2000) SpO2:  [88 %-100 %] 88 % (11/30 1945)  Weight change:  Filed Weights   04/15/21 0134 04/15/21 1728 04/18/21 1330  Weight: 51.3 kg 51.3 kg 51 kg    Intake/Output: I/O last 3 completed shifts: In: 73 [P.O.:880] Out: 956 [Urine:200; Other:756]   Intake/Output this shift:  Total I/O In: 360 [P.O.:360] Out: -   Physical Exam: General: No acute distress  Head: Normocephalic, atraumatic, hearing intact  Lungs:  Diminished, normal effort  Heart: Regular   Abdomen:  Soft, nontender, bowel sounds present  Extremities: no peripheral edema.  Neurologic: Awake, alert, follows simple commands  Skin: Warm   Access: Right IJ permcath  Right nephrostomy  Basic Metabolic Panel: Recent Labs  Lab 04/15/21 0410 04/16/21 0352 04/17/21 0437 04/18/21 0556 04/19/21 1130 04/20/21 0510  NA 132* 135 131* 136 137 135  K 4.2 4.0 4.1 4.2 3.4* 3.5  CL 98 97* 96* 102 100 102  CO2 25 29 25  21* 27 26  GLUCOSE 80 90 81 90 109* 90  BUN 41* 20 35* 42* 23* 29*  CREATININE 5.27* 3.24* 4.33* 5.62* 4.35* 5.33*  CALCIUM 7.4* 8.2* 8.4* 8.8* 8.6* 8.2*  MG  --  1.8 1.9 2.0  --   --   PHOS 5.1* 3.4  3.5 3.7  3.6 4.3  --   --      Liver Function Tests: Recent Labs  Lab 04/15/21 0410 04/16/21 0352 04/17/21 0437  ALBUMIN 1.8* 2.4* 2.4*    No results for input(s): LIPASE, AMYLASE in the last 168 hours. No results for input(s): AMMONIA in the last 168 hours.  CBC: Recent Labs  Lab 04/15/21 0410 04/16/21 0352 04/17/21 0437 04/18/21 0556 04/20/21 0510  WBC 4.4 5.2 7.9 7.5 6.2  HGB 8.2* 8.5* 9.1* 8.8* 8.4*  HCT 26.7* 26.7*  28.5* 27.6* 26.9*  MCV 99.3 99.3 97.6 96.2 97.8  PLT 141* 153 187 206 185     Cardiac Enzymes: No results for input(s): CKTOTAL, CKMB, CKMBINDEX, TROPONINI in the last 168 hours.  BNP: Invalid input(s): POCBNP  CBG: Recent Labs  Lab 04/19/21 1143 04/20/21 0752 04/20/21 1220 04/20/21 1538 04/21/21 1118  GLUCAP 113* 94 87 158* 111*     Microbiology: Results for orders placed or performed during the hospital encounter of 04/09/21  Resp Panel by RT-PCR (Flu A&B, Covid) Nasopharyngeal Swab     Status: None   Collection Time: 04/09/21  7:29 PM   Specimen: Nasopharyngeal Swab; Nasopharyngeal(NP) swabs in vial transport medium  Result Value Ref Range Status   SARS Coronavirus 2 by RT PCR NEGATIVE NEGATIVE Final    Comment: (NOTE) SARS-CoV-2 target nucleic acids are NOT DETECTED.  The SARS-CoV-2 RNA is generally detectable in upper respiratory specimens during the acute phase of infection. The lowest concentration of SARS-CoV-2 viral copies this assay can detect is 138 copies/mL. A negative result does not preclude SARS-Cov-2 infection and should not be used as the sole basis for treatment or other patient management decisions. A negative result may occur with  improper specimen collection/handling, submission of specimen other than nasopharyngeal swab, presence of viral mutation(s) within the areas  targeted by this assay, and inadequate number of viral copies(<138 copies/mL). A negative result must be combined with clinical observations, patient history, and epidemiological information. The expected result is Negative.  Fact Sheet for Patients:  EntrepreneurPulse.com.au  Fact Sheet for Healthcare Providers:  IncredibleEmployment.be  This test is no t yet approved or cleared by the [place] FDA and  has been authorized for detection and/or diagnosis of SARS-CoV-2 by FDA under an Emergency Use Authorization (EUA). This EUA will  remain  in effect (meaning this test can be used) for the duration of the COVID-19 declaration under Section 564(b)(1) of the Act, 21 U.S.C.section 360bbb-3(b)(1), unless the authorization is terminated  or revoked sooner.       Influenza A by PCR NEGATIVE NEGATIVE Final   Influenza B by PCR NEGATIVE NEGATIVE Final    Comment: (NOTE) The Xpert Xpress SARS-CoV-2/FLU/RSV plus assay is intended as an aid in the diagnosis of influenza from Nasopharyngeal swab specimens and should not be used as a sole basis for treatment. Nasal washings and aspirates are unacceptable for Xpert Xpress SARS-CoV-2/FLU/RSV testing.  Fact Sheet for Patients: EntrepreneurPulse.com.au  Fact Sheet for Healthcare Providers: IncredibleEmployment.be  This test is not yet approved or cleared by the [place] FDA and has been authorized for detection and/or diagnosis of SARS-CoV-2 by FDA under an Emergency Use Authorization (EUA). This EUA will remain in effect (meaning this test can be used) for the duration of the COVID-19 declaration under Section 564(b)(1) of the Act, 21 U.S.C. section 360bbb-3(b)(1), unless the authorization is terminated or revoked.  Performed at [facility], [address] [place], [place] [address]   MRSA Next Gen by PCR, Nasal     Status: Abnormal   Collection Time: 04/11/21 10:23 PM   Specimen: Nasal Mucosa; Nasal Swab  Result Value Ref Range Status   MRSA by PCR Next Gen DETECTED (A) NOT DETECTED Final    Comment: RESULT CALLED TO, READ BACK BY AND VERIFIED WITH: TESSYAMMA THOMAS @0006  04/12/21 RH (NOTE) The GeneXpert MRSA Assay (FDA approved for NASAL specimens only), is one component of a comprehensive MRSA colonization surveillance program. It is not intended to diagnose MRSA infection nor to guide or monitor treatment for MRSA infections. Test performance is not FDA approved in patients less than 3 years old. Performed  at [facility], [address], [place], [place] [address]   Urine Culture     Status: Abnormal   Collection Time: 04/11/21 10:30 PM   Specimen: In/Out Cath Urine  Result Value Ref Range Status   Specimen Description   Final    IN/OUT CATH URINE Performed at [facility], [address] [place], [place] [address]    Special Requests   Final    NONE Performed at [facility], [address] [place],  [address]    Culture (A)  Final    >=100,000 COLONIES/mL METHICILLIN RESISTANT STAPHYLOCOCCUS AUREUS   Report Status 04/16/2021 FINAL  Final   Organism ID, Bacteria METHICILLIN RESISTANT STAPHYLOCOCCUS AUREUS (A)  Final      Susceptibility   Methicillin resistant staphylococcus aureus - MIC*    CIPROFLOXACIN >=8 RESISTANT Resistant     GENTAMICIN <=0.5 SENSITIVE Sensitive     NITROFURANTOIN <=16 SENSITIVE Sensitive     OXACILLIN >=4 RESISTANT Resistant     TETRACYCLINE <=1 SENSITIVE Sensitive     VANCOMYCIN 1 SENSITIVE Sensitive     TRIMETH/SULFA <=10 SENSITIVE Sensitive     CLINDAMYCIN >=8 RESISTANT Resistant  RIFAMPIN <=0.5 SENSITIVE Sensitive     Inducible Clindamycin NEGATIVE Sensitive     * >=100,000 COLONIES/mL METHICILLIN RESISTANT STAPHYLOCOCCUS AUREUS  CULTURE, BLOOD (ROUTINE X 2) w Reflex to ID Panel     Status: None   Collection Time: 04/11/21 10:54 PM   Specimen: BLOOD LEFT HAND  Result Value Ref Range Status   Specimen Description BLOOD LEFT HAND  Final   Special Requests   Final    BOTTLES DRAWN AEROBIC ONLY Blood Culture adequate volume   Culture   Final    NO GROWTH 5 DAYS Performed at [facility], [address] [place], [place] [address]    Report Status 04/16/2021 FINAL  Final  CULTURE, BLOOD (ROUTINE X 2) w Reflex to ID Panel     Status: None   Collection Time: 04/11/21 11:05 PM   Specimen: BLOOD LEFT HAND  Result Value Ref Range Status   Specimen Description BLOOD LEFT HAND  Final   Special  Requests   Final    BOTTLES DRAWN AEROBIC ONLY Blood Culture adequate volume   Culture   Final    NO GROWTH 5 DAYS Performed at [facility], [address] [place], [place] [address]    Report Status 04/16/2021 FINAL  Final  Culture, Respiratory w Gram Stain     Status: None   Collection Time: 04/12/21 12:31 AM   Specimen: Tracheal Aspirate; Respiratory  Result Value Ref Range Status   Specimen Description   Final    TRACHEAL ASPIRATE Performed at [facility], [address], [place], [place] [address]    Special Requests   Final    NONE Performed at [facility], [place], [place] [address]    Gram Stain   Final    FEW SQUAMOUS EPITHELIAL CELLS PRESENT FEW WBC SEEN FEW GRAM POSITIVE RODS MODERATE GRAM POSITIVE COCCI    Culture   Final    ABUNDANT Consistent with normal respiratory flora. No Pseudomonas species isolated Performed at [facility] [address], [place], [place] [address]    Report Status 04/14/2021 FINAL  Final    Coagulation Studies: No results for input(s): LABPROT, INR in the last 72 hours.   Urinalysis: No results for input(s): COLORURINE, LABSPEC, PHURINE, GLUCOSEU, HGBUR, BILIRUBINUR, KETONESUR, PROTEINUR, UROBILINOGEN, NITRITE, LEUKOCYTESUR in the last 72 hours.  Invalid input(s): APPERANCEUR     Imaging: No results found.   Medications:      vitamin C  500 mg Oral BID   carvedilol  25 mg Oral BID WC   Chlorhexidine Gluconate Cloth  6 each Topical Q0600   epoetin (EPOGEN/PROCRIT) injection  4,000 Units Intravenous Q M,W,F-HD   feeding supplement (NEPRO CARB STEADY)  237 mL Oral BID BM   heparin  5,000 Units Subcutaneous Q8H   multivitamin  1 tablet Oral QHS   pantoprazole  40 mg Oral Daily   sodium chloride flush  10-40 mL Intracatheter Q12H   ondansetron **OR** ondansetron (ZOFRAN) IV, sodium chloride flush  Assessment/ Plan:  [name] is a 50 y.o. 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  Received about 2  L of fluid during resuscitation.   End stage renal disease with hyperkalemia: missed outpatient treatment and now with hypertension and pulmonary edema with pleural effusion.   -Treatment received yesterday, but terminated early due to machine malfunctions. No acute need for dialysis today.  -Next treatment scheduled for Friday   Anemia with chronic kidney disease:   Lab Results  Component Value Date   HGB 8.4 (L) 04/20/2021  Hgb below target, continue low dose EPO with treatments  Secondary Hyperparathyroidism: with hyperphosphatemia   Lab Results  Component Value Date   CALCIUM 8.2 (L) 04/20/2021   CAION 0.76 (LL) 08/07/2019   PHOS 4.3 04/18/2021    Will continue to monitor bone minerals during this admission.  4. Acute resp failure Intubated post code, subsequently extubated Tolerating room air    LOS: [place] 12/1/202212:43 PM

## 2021-04-21 NOTE — Discharge Summary (Signed)
Physician Discharge Summary  Larry Morrison PPJ:[id number] DOB: Dec 13, 1970 DOA: 04/09/2021  PCP: [facility] [place] date: 04/09/2021 Discharge date: 04/21/2021  Admitted From: Home Discharge disposition: Home    Code Status: Full Code   Discharge Diagnosis:   Principal Problem:   Acute on chronic combined systolic and diastolic CHF (congestive heart failure) ([place]) Active Problems:   ESRD (end stage renal disease) ([place])   Tobacco use disorder   Essential hypertension   Anemia in ESRD (end-stage renal disease) ([place])   Hyperkalemia   Elevated troponin   History of traumatic brain injury   Cognitive impairment   Pleural effusion   Noncompliance of patient with renal dialysis ([place])   Malnutrition of moderate degree   Cardiac arrest ([place])   Acute respiratory failure with hypoxia ([place])   Shortness of breath    Chief Complaint  Patient presents with   Shortness of Breath    Brief narrative: Larry Morrison is a 50 y.o. male with PMH significant for ESRD on HD MWF, HTN, severe protein calorie malnutrition, elevated troponin, anemia, tobacco use disorder, CVA, TBI. Patient presented from home to the ED on 04/09/2021 with dyspnea for a day after missing dialysis for several sessions.   In the ED, he was noted to be volume overloaded, severe electrolyte abnormalities and cocaine positive nephrology was consulted. He underwent urgent dialysis as well as thoracentesis. On 11/21, during dialysis session, patient had PEA arrest, ROSC achieved, required intubation. He was transferred to ICU, subsequently extubated and transferred to [facility].  Subjective: Patient was seen and examined this afternoon.  Lying down in bed.  Not in distress.  Wants to go home.  Mom and sister at bedside.  They are worried that he will not listen to them and will not comply with medication and dialysis.  Patient promised to them that he will do so and and his family agrees to take him  home.  Assessment/Plan: PEA arrest Acute hypoxic respiratory failure -In the setting of hyperkalemia.  ROSC achieved.  Required intubation, subsequently extubated and currently on room air.  ESRD HD MWF -Missed dialysis prior to presentation. -On dialysis schedule per nephrology at this time.  Continue outpatient dialysis.  Chronic severe systolic CHF Essential hypertension -Last echo with less than 20% EF.  Currently on Coreg 25 mg twice daily.  Continue the same.  Anemia of chronic disease -Hemoglobin between 8-9. stable.  EPO with dialysis Recent Labs    04/15/21 0410 04/16/21 0352 04/17/21 0437 04/18/21 0556 04/20/21 0510  HGB 8.2* 8.5* 9.1* 8.8* 8.4*  MCV 99.3 99.3 97.6 96.2 97.8   Polysubstance use -Cocaine positive on admission.  Counseled to quit  Severe protein calorie malnutrition generalized physical deconditioning -monitor for refeeding syndrome -nutritional supplements   Allergies as of 04/21/2021       Reactions   Vancomycin    Patient denies - repeated denial to pharm tech 09-05-2020        Medication List     STOP taking these medications    losartan 25 MG tablet Commonly known as: COZAAR   multivitamin Tabs tablet   sevelamer carbonate 800 MG tablet Commonly known as: RENVELA       TAKE these medications    aspirin 81 MG EC tablet Take 1 tablet (81 mg total) by mouth daily. Swallow whole.   carvedilol 25 MG tablet Commonly known as: COREG Take 1 tablet (25 mg total) by mouth 2 (two) times daily with a meal. What changed:  medication strength how much to take   feeding supplement (NEPRO CARB STEADY) Liqd Take 237 mLs by mouth 2 (two) times daily between meals. What changed: when to take this        Discharge Instructions:  Diet Recommendation:  Renal diet  General discharge instructions:  Follow with Primary MD [facility] [place] in 7 days   Get CBC/BMP checked in next visit within 1 week by PCP or SNF  MD. (We routinely change or add medications that can affect your baseline labs and fluid status, therefore we recommend that you get the mentioned basic workup next visit with your PCP, your PCP may decide not to get them or add new tests based on their clinical decision)  On your next visit with your PCP, please get your medicines reviewed and adjusted.  Please request your PCP  to go over all hospital tests, procedures, radiology results at the follow up, please get all Hospital records sent to your PCP by signing hospital release before you go home.  Activity: As tolerated with Full fall precautions use [place]/cane & assistance as needed  Avoid using any recreational substances like cigarette, tobacco, alcohol, or non-prescribed drug.  If you experience worsening of your admission symptoms, develop shortness of breath, life threatening emergency, suicidal or homicidal thoughts you must seek medical attention immediately by calling 911 or calling your MD immediately  if symptoms less severe.  You must read complete instructions/literature along with all the possible adverse reactions/side effects for all the medicines you take and that have been prescribed to you. Take any new medicine only after you have completely understood and accepted all the possible adverse reactions/side effects.   Do not drive, operate heavy machinery, perform activities at heights, swimming or participation in water activities or provide baby sitting services if your were admitted for syncope or siezures until you have seen by Primary MD or a Neurologist and advised to do so again.  Do not drive when taking Pain medications.  Do not take more than prescribed Pain, Sleep and Anxiety Medications  Wear Seat belts while driving.  Please note You were cared for by a hospitalist during your hospital stay. If you have any questions about your discharge medications or the care you received while you were in the hospital  after you are discharged, you can call the unit and asked to speak with the hospitalist on call if the hospitalist that took care of you is not available. Once you are discharged, your primary care physician will handle any further medical issues. Please note that NO REFILLS for any discharge medications will be authorized once you are discharged, as it is imperative that you return to your primary care physician (or establish a relationship with a primary care physician if you do not have one) for your aftercare needs so that they can reassess your need for medications and monitor your lab values.   Follow ups:    Follow-up Information     [facility] [place] Follow up.   Contact information: [place] [id number] [phone]                 Wound care:     Discharge Exam:   Vitals:   04/20/21 1915 04/20/21 1930 04/20/21 1945 04/20/21 2000  BP: (!) 150/83 (!) 148/85 (!) 157/84 (!) 147/92  Pulse:   (!) 114   Resp: (!) 23 (!) 31 (!) 33 (!) 30  Temp:  TempSrc:      SpO2:   (!) 88%   Weight:      Height:        Body mass index is 20.56 kg/m.  General exam: Middle-aged African-American male.  Not in physical distress Skin: No rashes, lesions or ulcers. HEENT: Atraumatic, normocephalic, no obvious bleeding Lungs: Clear to auscultation bilaterally CVS: Regular rate and rhythm, no murmur GI/Abd soft, nontender, nondistended, bowel sounds present CNS: Alert, awake, able to have some conversation.  Not agitated Psychiatry: Depressed look Extremities: No pedal edema, no calf tenderness  Time coordinating discharge: 35 minutes   The results of significant diagnostics from this hospitalization (including imaging, microbiology, ancillary and laboratory) are listed below for reference.    Procedures and Diagnostic Studies:   CT HEAD WO CONTRAST (5MM)  Result Date: 04/12/2021 CLINICAL DATA:  Altered mental status. EXAM: CT HEAD WITHOUT  CONTRAST TECHNIQUE: Contiguous axial images were obtained from the base of the skull through the vertex without intravenous contrast. COMPARISON:  None. 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 Metallic surgical clips with associated streak artifact noted in this area. There is no acute intracranial hemorrhage. No mass effect midline shift no extra-axial fluid collection. Vascular: No hyperdense vessel or unexpected calcification. Skull: No acute finding.  Left posterior parietal craniotomy. Sinuses/Orbits: Partially visualized 3 cm left maxillary sinus retention cyst or polyp. No air-fluid level. The mastoid air cells are clear. Other: None IMPRESSION: 1. No acute intracranial pathology. 2. 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 Electronically Signed   By: Anner Crete M.D.   On: 04/12/2021 01:27   DG Chest Port 1 View  Result Date: 04/13/2021 CLINICAL DATA:  Acute respiratory failure with hypoxia. EXAM: PORTABLE CHEST 1 VIEW COMPARISON:  One-view chest x-ray VI 21/22 FINDINGS: The endotracheal tube terminates 4.7 cm above the carina. NG tube courses off the inferior border of the film. Right IJ dialysis catheter is stable. The heart is enlarged. Left pleural effusion is slightly decreased prior exam. Moderate pulmonary vascular congestion remains. Left lower lobe airspace disease noted. Overall aeration of the left lung improved. Mild atelectasis is present at the right base. IMPRESSION: 1. Improving aeration of the left lung with persistent left lower lobe airspace disease and effusion. 2. Stable moderate pulmonary vascular congestion. 3. The support apparatus is stable. Electronically Signed   By: San Morelle M.D.   On: 04/13/2021 07:48   ECHOCARDIOGRAM COMPLETE  Result Date: 04/12/2021    ECHOCARDIOGRAM  REPORT   Patient Name:   Larry Morrison Date of Exam: 04/12/2021 Medical Rec #:  [id number]    Height:       62.0 in Accession #:    [id number]   Weight:       108.2 lb Date of Birth:  January 25, 1971     BSA:          1.473 m Patient Age:    30 years     BP:           100/68 mmHg Patient Gender: M            HR:           59 bpm. Exam Location:  [facility] Procedure: 2D Echo, Cardiac Doppler and Color Doppler Indications:     Cardiac Arrest I46.9  History:         Patient has  prior history of Echocardiogram examinations, most                  recent 04/01/2021. Stroke; Risk Factors:Hypertension.  Sonographer:     Sherrie Sport Referring Phys:  [id number] Bradly Bienenstock Diagnosing Phys: Nelva Bush MD IMPRESSIONS  1. Left ventricular ejection fraction, by estimation, is <20%. The left ventricle has severely decreased function. The left ventricle demonstrates global hypokinesis. Left ventricular diastolic parameters are consistent with Grade II diastolic dysfunction (pseudonormalization). Elevated left atrial pressure.  2. There is at least mild pulmonary hypertension (RVSP 35-40 mmHg plus central venous pressure). Right ventricular systolic function is moderately reduced. The right ventricular size is normal.  3. Left atrial size was severely dilated.  4. The mitral valve is abnormal. Mild to moderate mitral valve regurgitation. No evidence of mitral stenosis.  5. Tricuspid valve regurgitation is moderate to severe.  6. The aortic valve is tricuspid. Aortic valve regurgitation is not visualized. No aortic stenosis is present. Comparison(s): A prior study was performed on 03/01/2021. No significant change from prior study. FINDINGS  Left Ventricle: Left ventricular ejection fraction, by estimation, is <20%. The left ventricle has severely decreased function. The left ventricle demonstrates global hypokinesis. The left ventricular internal cavity size was normal in size. There is borderline left ventricular hypertrophy. Left  ventricular diastolic parameters are consistent with Grade II diastolic dysfunction (pseudonormalization). Elevated left atrial pressure. Right Ventricle: There is at least mild pulmonary hypertension (RVSP 35-40 mmHg plus central venous pressure). The right ventricular size is normal. No increase in right ventricular wall thickness. Right ventricular systolic function is moderately reduced. Left Atrium: Left atrial size was severely dilated. Right Atrium: Right atrial size was normal in size. Pericardium: Trivial pericardial effusion is present. Mitral Valve: The mitral valve is abnormal. There is mild thickening of the mitral valve leaflet(s). Mild to moderate mitral valve regurgitation. No evidence of mitral valve stenosis. MV peak gradient, 3.1 mmHg. The mean mitral valve gradient is 2.0 mmHg. Tricuspid Valve: The tricuspid valve is grossly normal. Tricuspid valve regurgitation is moderate to severe. Aortic Valve: The aortic valve is tricuspid. Aortic valve regurgitation is not visualized. No aortic stenosis is present. Aortic valve mean gradient measures 2.5 mmHg. Aortic valve peak gradient measures 4.2 mmHg. Aortic valve area, by VTI measures 1.98 cm. Pulmonic Valve: The pulmonic valve was not well visualized. Pulmonic valve regurgitation is trivial. No evidence of pulmonic stenosis. Aorta: The aortic root is normal in size and structure. Pulmonary Artery: The pulmonary artery is not well seen. Venous: IVC assessment for right atrial pressure unable to be performed due to mechanical ventilation. IAS/Shunts: No atrial level shunt detected by color flow Doppler. Additional Comments: There is pleural effusion in the left lateral region.  LEFT VENTRICLE PLAX 2D LVIDd:         5.10 cm      Diastology LVIDs:         4.60 cm      LV e' medial:    4.79 cm/s LV PW:         1.10 cm      LV E/e' medial:  17.5 LV IVS:        0.90 cm      LV e' lateral:   6.20 cm/s LVOT diam:     2.00 cm      LV E/e' lateral: 13.5 LV SV:          33 LV SV Index:   22 LVOT Area:  3.14 cm  LV Volumes (MOD) LV vol d, MOD A2C: 141.0 ml LV vol d, MOD A4C: 141.0 ml LV vol s, MOD A2C: 101.0 ml LV vol s, MOD A4C: 113.0 ml LV SV MOD A2C:     40.0 ml LV SV MOD A4C:     141.0 ml LV SV MOD BP:      35.3 ml RIGHT VENTRICLE RV Basal diam:  3.50 cm RV S prime:     9.03 cm/s TAPSE (M-mode): 1.6 cm LEFT ATRIUM              Index        RIGHT ATRIUM           Index LA diam:        4.10 cm  2.78 cm/m   RA Area:     17.29 cm LA Vol (A2C):   121.0 ml 82.16 ml/m  RA Volume:   52.01 ml  35.32 ml/m LA Vol (A4C):   127.0 ml 86.24 ml/m LA Biplane Vol: 126.0 ml 85.56 ml/m  AORTIC VALVE                    PULMONIC VALVE AV Area (Vmax):    1.92 cm     PV Vmax:        0.47 m/s AV Area (Vmean):   1.87 cm     PV Vmean:       31.400 cm/s AV Area (VTI):     1.98 cm     PV VTI:         0.088 m AV Vmax:           102.50 cm/s  PV Peak grad:   0.9 mmHg AV Vmean:          67.600 cm/s  PV Mean grad:   0.0 mmHg AV VTI:            0.165 m      RVOT Peak grad: 2 mmHg AV Peak Grad:      4.2 mmHg AV Mean Grad:      2.5 mmHg LVOT Vmax:         62.70 cm/s LVOT Vmean:        40.200 cm/s LVOT VTI:          0.104 m LVOT/AV VTI ratio: 0.63  AORTA Ao Root diam: 2.90 cm MITRAL VALVE               TRICUSPID VALVE MV Area (PHT): 4.26 cm    TR Peak grad:   37.7 mmHg MV Area VTI:   1.38 cm    TR Vmax:        307.00 cm/s MV Peak grad:  3.1 mmHg MV Mean grad:  2.0 mmHg    SHUNTS MV Vmax:       0.88 m/s    Systemic VTI:  0.10 m MV Vmean:      58.8 cm/s   Systemic Diam: 2.00 cm MV Decel Time: 178 msec    Pulmonic VTI:  0.114 m MV E velocity: 84.00 cm/s MV A velocity: 68.60 cm/s MV E/A ratio:  1.22 Nelva Bush MD Electronically signed by Nelva Bush MD Signature Date/Time: 04/12/2021/2:01:34 PM    Final      Labs:   Basic Metabolic Panel: Recent Labs  Lab 04/15/21 0410 04/16/21 0352 04/17/21 0437 04/18/21 0556 04/19/21 1130 04/20/21 0510  NA 132* 135 131* 136 137 135  K 4.2  4.0 4.1 4.2 3.4*  3.5  CL 98 97* 96* 102 100 102  CO2 25 29 25  21* 27 26  GLUCOSE 80 90 81 90 109* 90  BUN 41* 20 35* 42* 23* 29*  CREATININE 5.27* 3.24* 4.33* 5.62* 4.35* 5.33*  CALCIUM 7.4* 8.2* 8.4* 8.8* 8.6* 8.2*  MG  --  1.8 1.9 2.0  --   --   PHOS 5.1* 3.4  3.5 3.7  3.6 4.3  --   --    GFR Estimated Creatinine Clearance: 11.7 mL/min (A) (by C-G formula based on SCr of 5.33 mg/dL (H)). Liver Function Tests: Recent Labs  Lab 04/15/21 0410 04/16/21 0352 04/17/21 0437  ALBUMIN 1.8* 2.4* 2.4*   No results for input(s): LIPASE, AMYLASE in the last 168 hours. No results for input(s): AMMONIA in the last 168 hours. Coagulation profile No results for input(s): INR, PROTIME in the last 168 hours.  CBC: Recent Labs  Lab 04/15/21 0410 04/16/21 0352 04/17/21 0437 04/18/21 0556 04/20/21 0510  WBC 4.4 5.2 7.9 7.5 6.2  HGB 8.2* 8.5* 9.1* 8.8* 8.4*  HCT 26.7* 26.7* 28.5* 27.6* 26.9*  MCV 99.3 99.3 97.6 96.2 97.8  PLT 141* 153 187 206 185   Cardiac Enzymes: No results for input(s): CKTOTAL, CKMB, CKMBINDEX, TROPONINI in the last 168 hours. BNP: Invalid input(s): POCBNP CBG: Recent Labs  Lab 04/19/21 1143 04/20/21 0752 04/20/21 1220 04/20/21 1538 04/21/21 1118  GLUCAP 113* 94 87 158* 111*   D-Dimer No results for input(s): DDIMER in the last 72 hours. Hgb A1c No results for input(s): HGBA1C in the last 72 hours. Lipid Profile No results for input(s): CHOL, HDL, LDLCALC, TRIG, CHOLHDL, LDLDIRECT in the last 72 hours. Thyroid function studies No results for input(s): TSH, T4TOTAL, T3FREE, THYROIDAB in the last 72 hours.  Invalid input(s): FREET3 Anemia work up No results for input(s): VITAMINB12, FOLATE, FERRITIN, TIBC, IRON, RETICCTPCT in the last 72 hours. Microbiology Recent Results (from the past 240 hour(s))  MRSA Next Gen by PCR, Nasal     Status: Abnormal   Collection Time: 04/11/21 10:23 PM   Specimen: Nasal Mucosa; Nasal Swab  Result Value Ref Range  Status   MRSA by PCR Next Gen DETECTED (A) NOT DETECTED Final    Comment: RESULT CALLED TO, READ BACK BY AND VERIFIED WITH: TESSYAMMA THOMAS @0006  04/12/21 RH (NOTE) The GeneXpert MRSA Assay (FDA approved for NASAL specimens only), is one component of a comprehensive MRSA colonization surveillance program. It is not intended to diagnose MRSA infection nor to guide or monitor treatment for MRSA infections. Test performance is not FDA approved in patients less than 75 years old. Performed at [facility], [address] [place], [place] [address]   Urine Culture     Status: Abnormal   Collection Time: 04/11/21 10:30 PM   Specimen: In/Out Cath Urine  Result Value Ref Range Status   Specimen Description   Final    IN/OUT CATH URINE Performed at [facility], [address], [place], [place] [address]    Special Requests   Final    NONE Performed at [facility], [address] [place], [place] [address]    Culture (A)  Final    >=100,000 COLONIES/mL METHICILLIN RESISTANT STAPHYLOCOCCUS AUREUS   Report Status 04/16/2021 FINAL  Final   Organism ID, Bacteria METHICILLIN RESISTANT STAPHYLOCOCCUS AUREUS (A)  Final      Susceptibility   Methicillin resistant staphylococcus aureus - MIC*    CIPROFLOXACIN >=8 RESISTANT Resistant  GENTAMICIN <=0.5 SENSITIVE Sensitive     NITROFURANTOIN <=16 SENSITIVE Sensitive     OXACILLIN >=4 RESISTANT Resistant     TETRACYCLINE <=1 SENSITIVE Sensitive     VANCOMYCIN 1 SENSITIVE Sensitive     TRIMETH/SULFA <=10 SENSITIVE Sensitive     CLINDAMYCIN >=8 RESISTANT Resistant     RIFAMPIN <=0.5 SENSITIVE Sensitive     Inducible Clindamycin NEGATIVE Sensitive     * >=100,000 COLONIES/mL METHICILLIN RESISTANT STAPHYLOCOCCUS AUREUS  CULTURE, BLOOD (ROUTINE X 2) w Reflex to ID Panel     Status: None   Collection Time: 04/11/21 10:54 PM   Specimen: BLOOD LEFT HAND  Result Value Ref Range Status   Specimen Description BLOOD  LEFT HAND  Final   Special Requests   Final    BOTTLES DRAWN AEROBIC ONLY Blood Culture adequate volume   Culture   Final    NO GROWTH 5 DAYS Performed at [facility], [address] [place], [place] [address]    Report Status 04/16/2021 FINAL  Final  CULTURE, BLOOD (ROUTINE X 2) w Reflex to ID Panel     Status: None   Collection Time: 04/11/21 11:05 PM   Specimen: BLOOD LEFT HAND  Result Value Ref Range Status   Specimen Description BLOOD LEFT HAND  Final   Special Requests   Final    BOTTLES DRAWN AEROBIC ONLY Blood Culture adequate volume   Culture   Final    NO GROWTH 5 DAYS Performed at [facility], [address], [place], [place] [address]    Report Status 04/16/2021 FINAL  Final  Culture, Respiratory w Gram Stain     Status: None   Collection Time: 04/12/21 12:31 AM   Specimen: Tracheal Aspirate; Respiratory  Result Value Ref Range Status   Specimen Description   Final    TRACHEAL ASPIRATE Performed at [facility], [address] [place], [place] [address]    Special Requests   Final    NONE Performed at [facility], [place], [place] [address]    Gram Stain   Final    FEW SQUAMOUS EPITHELIAL CELLS PRESENT FEW WBC SEEN FEW GRAM POSITIVE RODS MODERATE GRAM POSITIVE COCCI    Culture   Final    ABUNDANT Consistent with normal respiratory flora. No Pseudomonas species isolated Performed at [facility] [address] [place],  [address]    Report Status 04/14/2021 FINAL  Final     Signed: Marlowe Aschoff Hidaya Daniel  [facility] 04/21/2021, 1:59 PM

## 2021-04-21 NOTE — Progress Notes (Signed)
Patient refused all medication this morning as well as allowing me to draw labs from his port. Patient told me to get the F out of his room.

## 2021-04-21 NOTE — Progress Notes (Signed)
Discharge instructions given to patient, patient's mother, and patient's sister.  Education emphasized on medication and HD compliance.  Right IJ tunneled HD catheter intact, reminded patient to keep it dry and clean.  Encouraged to call the doctor for concerns.  All three verbalized understanding.  Right femoral TLC discontinued as ordered, site with minimal bleeding, pressure dressing applied, tolerated well by patient.  Needs addressed.  Discharged home.

## 2021-04-25 LAB — BLOOD GAS, ARTERIAL
Acid-base deficit: 9.7 mmol/L — ABNORMAL HIGH (ref 0.0–2.0)
Bicarbonate: 18.9 mmol/L — ABNORMAL LOW (ref 20.0–28.0)
FIO2: 100
O2 Saturation: 73.8 %
Patient temperature: 37
pCO2 arterial: 53 mmHg — ABNORMAL HIGH (ref 32.0–48.0)
pH, Arterial: 7.16 — CL (ref 7.350–7.450)
pO2, Arterial: 51 mmHg — ABNORMAL LOW (ref 83.0–108.0)

## 2021-04-28 ENCOUNTER — Ambulatory Visit (INDEPENDENT_AMBULATORY_CARE_PROVIDER_SITE_OTHER): Payer: Medicaid Other | Admitting: Nurse Practitioner

## 2021-04-28 ENCOUNTER — Other Ambulatory Visit (INDEPENDENT_AMBULATORY_CARE_PROVIDER_SITE_OTHER): Payer: Self-pay | Admitting: Nurse Practitioner

## 2021-04-28 ENCOUNTER — Encounter (INDEPENDENT_AMBULATORY_CARE_PROVIDER_SITE_OTHER): Payer: Medicaid Other

## 2021-04-28 ENCOUNTER — Other Ambulatory Visit (INDEPENDENT_AMBULATORY_CARE_PROVIDER_SITE_OTHER): Payer: Medicaid Other

## 2021-04-28 DIAGNOSIS — N186 End stage renal disease: Secondary | ICD-10-CM

## 2021-06-09 ENCOUNTER — Ambulatory Visit (INDEPENDENT_AMBULATORY_CARE_PROVIDER_SITE_OTHER): Payer: Medicaid Other | Admitting: Nurse Practitioner

## 2021-06-09 ENCOUNTER — Encounter (INDEPENDENT_AMBULATORY_CARE_PROVIDER_SITE_OTHER): Payer: Self-pay | Admitting: Nurse Practitioner

## 2021-06-09 ENCOUNTER — Ambulatory Visit (INDEPENDENT_AMBULATORY_CARE_PROVIDER_SITE_OTHER): Payer: Medicaid Other

## 2021-06-09 ENCOUNTER — Other Ambulatory Visit: Payer: Self-pay

## 2021-06-09 VITALS — BP 147/90 | HR 91 | Resp 16

## 2021-06-09 DIAGNOSIS — N186 End stage renal disease: Secondary | ICD-10-CM | POA: Diagnosis not present

## 2021-06-09 DIAGNOSIS — F17200 Nicotine dependence, unspecified, uncomplicated: Secondary | ICD-10-CM | POA: Diagnosis not present

## 2021-06-09 DIAGNOSIS — I10 Essential (primary) hypertension: Secondary | ICD-10-CM

## 2021-06-09 NOTE — Progress Notes (Signed)
Subjective:    Patient ID: Larry Morrison, male    DOB: [birth year], 51 y.o.   MRN: [id number] Chief Complaint  Patient presents with   Follow-up    Ref [place] new access consult    Larry Morrison is a 51 year old male that presents today for evaluation for permanent dialysis access.  The patient has previously had resection of the right brachial axillary AV graft.  He currently has no permanent dialysis access.  He is maintained via PermCath.  The patient denies amaurosis fugax or recent TIA symptoms. There are no recent neurological changes noted. The patient denies claudication symptoms or rest pain symptoms. The patient denies history of DVT, PE or superficial thrombophlebitis. The patient denies recent episodes of angina or shortness of breath.    Today duplex shows adequate access for creation of a left brachial axillary AV graft.   Review of Systems  Neurological:  Positive for weakness.  All other systems reviewed and are negative.     Objective:   Physical Exam Vitals reviewed.  HENT:     Head: Normocephalic.  Cardiovascular:     Rate and Rhythm: Normal rate.     Pulses:          Radial pulses are 1+ on the left side.  Pulmonary:     Effort: Pulmonary effort is normal.  Skin:    General: Skin is warm and dry.  Neurological:     Mental Status: He is alert and oriented to person, place, and time.     Motor: Weakness present.     Gait: Gait abnormal.  Psychiatric:        Mood and Affect: Mood normal.        Behavior: Behavior normal.        Thought Content: Thought content normal.        Judgment: Judgment normal.    BP (!) 147/90 (BP Location: Left Arm)    Pulse 91    Resp 16   Past Medical History:  Diagnosis Date   Anemia    Aneurysm ([place])    brain at age 10   Dialysis patient [facility])    Dyspnea    Foot drop    History of kidney stones    History of nephrostomy    Hypertension    Renal disorder    Stroke [facility])     Social History   Socioeconomic  History   Marital status: Single    Spouse name: Not on file   Number of children: Not on file   Years of education: Not on file   Highest education level: Not on file  Occupational History   Not on file  Tobacco Use   Smoking status: Former    Packs/day: 0.50    Types: Cigarettes   Smokeless tobacco: Never  Vaping Use   Vaping Use: Never used  Substance and Sexual Activity   Alcohol use: No   Drug use: Yes    Types: Marijuana    Comment: today   Sexual activity: Not Currently  Other Topics Concern   Not on file  Social History Narrative   Not on file   Social Determinants of Health   Financial Resource Strain: Not on file  Food Insecurity: Not on file  Transportation Needs: Not on file  Physical Activity: Not on file  Stress: Not on file  Social Connections: Not on file  Intimate Partner Violence: Not on file    Past Surgical History:  Procedure Laterality Date  A/V SHUNT INTERVENTION N/A 03/01/2021   Procedure: A/V SHUNT INTERVENTION;  Surgeon: Katha Cabal, MD;  Location: [place] [facility];  Service: Cardiovascular;  Laterality: N/A;   AV FISTULA PLACEMENT Right 08/07/2019   Procedure: ARTERIOVENOUS (AV) FISTULA CREATION;  Surgeon: Algernon Huxley, MD;  Location: [facility] ORS;  Service: Vascular;  Laterality: Right;   AV FISTULA PLACEMENT Right 11/20/2019   Procedure: INSERTION OF ARTERIOVENOUS (AV) GORE-TEX GRAFT ARM;  Surgeon: Algernon Huxley, MD;  Location: [facility] ORS;  Service: Vascular;  Laterality: Right;   IR NEPHROSTOMY EXCHANGE RIGHT  09/10/2020   NEPHRECTOMY Left    NEPHRECTOMY     ORCHIECTOMY Right 10/27/2016   Procedure: PSB ORCHIECTOMY;  Surgeon: Hollice Espy, MD;  Location: [facility] ORS;  Service: Urology;  Laterality: Right;   ORCHIOPEXY Bilateral 10/27/2016   Procedure: ORCHIOPEXY ADULT;  Surgeon: Hollice Espy, MD;  Location: [facility] ORS;  Service: Urology;  Laterality: Bilateral;   SCROTAL EXPLORATION Bilateral 10/27/2016   Procedure: SCROTUM EXPLORATION;   Surgeon: Hollice Espy, MD;  Location: [facility] ORS;  Service: Urology;  Laterality: Bilateral;    Family History  Family history unknown: Yes    Allergies  Allergen Reactions   Vancomycin     Patient denies - repeated denial to pharm tech 09-05-2020    CBC Latest Ref Rng & Units 04/20/2021 04/18/2021 04/17/2021  WBC 4.0 - 10.5 K/uL 6.2 7.5 7.9  Hemoglobin 13.0 - 17.0 g/dL 8.4(L) 8.8(L) 9.1(L)  Hematocrit 39.0 - 52.0 % 26.9(L) 27.6(L) 28.5(L)  Platelets 150 - 400 K/uL 185 206 187      CMP     Component Value Date/Time   NA 135 04/20/2021 0510   NA 138 10/22/2013 1732   K 3.5 04/20/2021 0510   K 4.7 10/22/2013 1732   CL 102 04/20/2021 0510   CL 112 (H) 10/22/2013 1732   CO2 26 04/20/2021 0510   CO2 20 (L) 10/22/2013 1732   GLUCOSE 90 04/20/2021 0510   GLUCOSE 68 10/22/2013 1732   BUN 29 (H) 04/20/2021 0510   BUN 74 (H) 10/22/2013 1732   CREATININE 5.33 (H) 04/20/2021 0510   CREATININE 5.22 (H) 10/22/2013 1732   CALCIUM 8.2 (L) 04/20/2021 0510   CALCIUM 9.3 10/22/2013 1732   PROT 7.3 04/11/2021 1930   PROT 8.7 (H) 07/17/2012 1219   ALBUMIN 2.4 (L) 04/17/2021 0437   ALBUMIN 3.1 (L) 07/17/2012 1219   AST 26 04/11/2021 1930   AST 17 07/17/2012 1219   ALT 15 04/11/2021 1930   ALT 14 07/17/2012 1219   ALKPHOS 105 04/11/2021 1930   ALKPHOS 81 07/17/2012 1219   BILITOT 1.3 (H) 04/11/2021 1930   BILITOT 0.7 07/17/2012 1219   GFRNONAA 12 (L) 04/20/2021 0510   GFRNONAA 12 (L) 10/22/2013 1732   GFRAA 9 (L) 11/20/2019 1407   GFRAA 14 (L) 10/22/2013 1732     No results found.     Assessment & Plan:   1. ESRD (end stage renal disease) ([place]) Recommend:  At this time the patient does not have appropriate extremity access for dialysis  Patient should have a left rachial axillary graft created.  The risks, benefits and alternative therapies were reviewed in detail with the patient.  All questions were answered.  The patient agrees to proceed with surgery.    2.  Tobacco use disorder Smoking cessation was discussed, 3-10 minutes spent on this topic specifically   3. Essential hypertension Continue antihypertensive medications as already ordered, these medications have been reviewed and there  are no changes at this time.    Current Outpatient Medications on File Prior to Visit  Medication Sig Dispense Refill   aspirin EC 81 MG EC tablet Take 1 tablet (81 mg total) by mouth daily. Swallow whole.     carvedilol (COREG) 25 MG tablet Take 1 tablet (25 mg total) by mouth 2 (two) times daily with a meal. 60 tablet 2   Nutritional Supplements (FEEDING SUPPLEMENT, NEPRO CARB STEADY,) LIQD Take 237 mLs by mouth 2 (two) times daily between meals.  0   No current facility-administered medications on file prior to visit.    There are no Patient Instructions on file for this visit. No follow-ups on file.   Kris Hartmann, NP

## 2021-06-10 ENCOUNTER — Encounter (INDEPENDENT_AMBULATORY_CARE_PROVIDER_SITE_OTHER): Payer: Self-pay | Admitting: Nurse Practitioner

## 2021-06-13 ENCOUNTER — Other Ambulatory Visit (INDEPENDENT_AMBULATORY_CARE_PROVIDER_SITE_OTHER): Payer: Self-pay | Admitting: Nurse Practitioner

## 2021-06-14 ENCOUNTER — Telehealth (INDEPENDENT_AMBULATORY_CARE_PROVIDER_SITE_OTHER): Payer: Self-pay

## 2021-06-14 NOTE — Telephone Encounter (Signed)
I contacted [name] [place] at [facility], regarding this patient to schedule him for a left brachial axillary graft with Dr. Delana Meyer. I offered 06/24/21 to be scheduled for surgery and per [name] he will call back when ready to schedule the patient.

## 2021-06-21 ENCOUNTER — Other Ambulatory Visit (INDEPENDENT_AMBULATORY_CARE_PROVIDER_SITE_OTHER): Payer: Self-pay | Admitting: Nurse Practitioner

## 2021-06-21 ENCOUNTER — Other Ambulatory Visit: Payer: Self-pay

## 2021-06-21 ENCOUNTER — Encounter (HOSPITAL_COMMUNITY): Payer: Self-pay | Admitting: Urgent Care

## 2021-06-21 ENCOUNTER — Encounter
Admission: RE | Admit: 2021-06-21 | Discharge: 2021-06-21 | Disposition: A | Discharge status: Home / Self Care | Payer: Medicaid Other | Source: Ambulatory Visit | Attending: Vascular Surgery | Admitting: Vascular Surgery

## 2021-06-21 DIAGNOSIS — N186 End stage renal disease: Secondary | ICD-10-CM

## 2021-06-21 HISTORY — DX: Full incontinence of feces: R15.9

## 2021-06-21 HISTORY — DX: Cardiomyopathy, unspecified: I42.9

## 2021-06-21 HISTORY — DX: Dependence on wheelchair: Z99.3

## 2021-06-21 HISTORY — DX: Dependence on renal dialysis: Z99.2

## 2021-06-21 HISTORY — DX: Anemia in chronic kidney disease: D63.1

## 2021-06-21 HISTORY — DX: Chronic kidney disease, unspecified: N18.9

## 2021-06-21 HISTORY — DX: Neuromuscular dysfunction of bladder, unspecified: N31.9

## 2021-06-21 HISTORY — DX: Acquired absence of kidney: Z90.5

## 2021-06-21 HISTORY — DX: Hyperkalemia: E87.5

## 2021-06-21 HISTORY — DX: Other psychoactive substance abuse, uncomplicated: F19.10

## 2021-06-21 HISTORY — DX: Unspecified systolic (congestive) heart failure: I50.20

## 2021-06-21 HISTORY — DX: Thrombocytopenia, unspecified: D69.6

## 2021-06-21 HISTORY — DX: Tobacco use: Z72.0

## 2021-06-21 HISTORY — DX: Pulmonary hypertension, unspecified: I27.20

## 2021-06-21 HISTORY — DX: End stage renal disease: N18.6

## 2021-06-21 HISTORY — DX: Other specified personal risk factors, not elsewhere classified: Z91.89

## 2021-06-21 HISTORY — DX: Unspecified protein-calorie malnutrition: E46
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
## 2021-06-21 NOTE — Patient Instructions (Signed)
Your procedure is scheduled on:06-24-21 Friday Report to the Registration Desk on the 1st floor of the [facility].Then proceed to the 2nd floor Surgery Desk in the [facility] To find out your arrival time, please call [phone] between 1PM - 3PM on:06-23-21 Thursday  REMEMBER: Instructions that are not followed completely may result in serious medical risk, up to and including death; or upon the discretion of your surgeon and anesthesiologist your surgery may need to be rescheduled.  Do not eat food after midnight the night before surgery.  No gum chewing, lozengers or hard candies.  You may however, drink CLEAR liquids up to 2 hours before you are scheduled to arrive for your surgery. Do not drink anything within 2 hours of your scheduled arrival time.  Clear liquids include: - water  - apple juice without pulp - gatorade (not RED, PURPLE, OR BLUE) - black coffee or tea (Do NOT add milk or creamers to the coffee or tea) Do NOT drink anything that is not on this list.  TAKE THESE MEDICATIONS THE MORNING OF SURGERY WITH A SIP OF WATER: -carvedilol (COREG)   One week prior to surgery: Stop Anti-inflammatories (NSAIDS) such as Advil, Aleve, Ibuprofen, Motrin, Naproxen, Naprosyn and Aspirin based products such as Excedrin, Goodys Powder, BC Powder.You may however, take Tylenol if needed for pain up until the day of surgery.  Stop ANY OVER THE COUNTER supplements/vitamins NOW ([phone]) until after surgery.  No Alcohol for 24 hours before or after surgery.  No Smoking including e-cigarettes for 24 hours prior to surgery.  No chewable tobacco products for at least 6 hours prior to surgery.  No nicotine patches on the day of surgery.  Do not use any "recreational" drugs for at least a week prior to your surgery.  Please be advised that the combination of cocaine and anesthesia may have negative outcomes, up to and including death. If you test positive for cocaine, your surgery will  be cancelled.  On the morning of surgery brush your teeth with toothpaste and water, you may rinse your mouth with mouthwash if you wish. Do not swallow any toothpaste or mouthwash.  Use CHG wipes as directed on instruction sheet.  Do not wear jewelry, make-up, hairpins, clips or nail polish.  Do not wear lotions, powders, or perfumes.   Do not shave body from the neck down 48 hours prior to surgery just in case you cut yourself which could leave a site for infection.  Also, freshly shaved skin may become irritated if using the CHG soap.  Contact lenses, hearing aids and dentures may not be worn into surgery.  Do not bring valuables to the hospital. [facility] is not responsible for any missing/lost belongings or valuables.  Notify your doctor if there is any change in your medical condition (cold, fever, infection).  Wear comfortable clothing (specific to your surgery type) to the hospital.  After surgery, you can help prevent lung complications by doing breathing exercises.  Take deep breaths and cough every 1-2 hours. Your doctor may order a device called an Incentive Spirometer to help you take deep breaths. When coughing or sneezing, hold a pillow firmly against your incision with both hands. This is called splinting. Doing this helps protect your incision. It also decreases belly discomfort.  If you are being admitted to the hospital overnight, leave your suitcase in the car. After surgery it may be brought to your room.  If you are being discharged the day of surgery, you  will not be allowed to drive home. You will need a responsible adult (18 years or older) to drive you home and stay with you that night.   If you are taking public transportation, you will need to have a responsible adult (18 years or older) with you. Please confirm with your physician that it is acceptable to use public transportation.   Please call the [facility]. at [phone] if  you have any questions about these instructions.  Surgery Visitation Policy:  Patients undergoing a surgery or procedure may have one family member or support person with them as long as that person is not COVID-19 positive or experiencing its symptoms.  That person may remain in the waiting area during the procedure and may rotate out with other people.  Inpatient Visitation:    Visiting hours are 7 a.m. to 8 p.m. Up to two visitors ages 16+ are allowed at one time in a patient room. The visitors may rotate out with other people during the day. Visitors must check out when they leave, or other visitors will not be allowed. One designated support person may remain overnight. The visitor must pass COVID-19 screenings, use hand sanitizer when entering and exiting the patients room and wear a mask at all times, including in the patients room. Patients must also wear a mask when staff or their visitor are in the room. Masking is required regardless of vaccination status.

## 2021-06-23 ENCOUNTER — Telehealth (INDEPENDENT_AMBULATORY_CARE_PROVIDER_SITE_OTHER): Payer: Self-pay

## 2021-06-23 ENCOUNTER — Inpatient Hospital Stay: Admission: RE | Admit: 2021-06-23 | Payer: Medicaid Other | Source: Ambulatory Visit

## 2021-06-23 ENCOUNTER — Ambulatory Visit: Payer: Medicaid Other | Admitting: Cardiology

## 2021-06-23 NOTE — Telephone Encounter (Signed)
Surgery has been canceled due to patient no showed his appointment for cardiac clearance stress test.

## 2021-06-24 ENCOUNTER — Encounter: Payer: Self-pay | Admitting: Cardiology

## 2021-06-24 ENCOUNTER — Ambulatory Visit: Admission: RE | Admit: 2021-06-24 | Payer: Medicaid Other | Source: Ambulatory Visit | Admitting: Vascular Surgery

## 2021-06-24 ENCOUNTER — Encounter: Admission: RE | Payer: Self-pay | Source: Ambulatory Visit

## 2021-06-24 SURGERY — INSERTION OF ARTERIOVENOUS (AV) GORE-TEX GRAFT ARM
Anesthesia: General | Laterality: Left

## 2021-06-29 ENCOUNTER — Inpatient Hospital Stay
Admission: EM | Admit: 2021-06-29 | Discharge: 2021-07-02 | DRG: 871 | Disposition: A | Discharge status: Home / Self Care | Payer: Medicaid Other | Attending: Internal Medicine | Admitting: Internal Medicine

## 2021-06-29 ENCOUNTER — Other Ambulatory Visit: Payer: Self-pay

## 2021-06-29 ENCOUNTER — Emergency Department: Payer: Medicaid Other

## 2021-06-29 DIAGNOSIS — I272 Pulmonary hypertension, unspecified: Secondary | ICD-10-CM | POA: Diagnosis present

## 2021-06-29 DIAGNOSIS — Z936 Other artificial openings of urinary tract status: Secondary | ICD-10-CM

## 2021-06-29 DIAGNOSIS — Z8701 Personal history of pneumonia (recurrent): Secondary | ICD-10-CM

## 2021-06-29 DIAGNOSIS — F17200 Nicotine dependence, unspecified, uncomplicated: Secondary | ICD-10-CM | POA: Diagnosis present

## 2021-06-29 DIAGNOSIS — Z8673 Personal history of transient ischemic attack (TIA), and cerebral infarction without residual deficits: Secondary | ICD-10-CM

## 2021-06-29 DIAGNOSIS — T83098A Other mechanical complication of other indwelling urethral catheter, initial encounter: Secondary | ICD-10-CM

## 2021-06-29 DIAGNOSIS — I10 Essential (primary) hypertension: Secondary | ICD-10-CM | POA: Diagnosis present

## 2021-06-29 DIAGNOSIS — A419 Sepsis, unspecified organism: Principal | ICD-10-CM

## 2021-06-29 DIAGNOSIS — Z8616 Personal history of COVID-19: Secondary | ICD-10-CM

## 2021-06-29 DIAGNOSIS — R778 Other specified abnormalities of plasma proteins: Secondary | ICD-10-CM | POA: Diagnosis present

## 2021-06-29 DIAGNOSIS — F17210 Nicotine dependence, cigarettes, uncomplicated: Secondary | ICD-10-CM | POA: Diagnosis present

## 2021-06-29 DIAGNOSIS — Z87442 Personal history of urinary calculi: Secondary | ICD-10-CM

## 2021-06-29 DIAGNOSIS — N2581 Secondary hyperparathyroidism of renal origin: Secondary | ICD-10-CM | POA: Diagnosis present

## 2021-06-29 DIAGNOSIS — T83012A Breakdown (mechanical) of nephrostomy catheter, initial encounter: Secondary | ICD-10-CM | POA: Diagnosis present

## 2021-06-29 DIAGNOSIS — N319 Neuromuscular dysfunction of bladder, unspecified: Secondary | ICD-10-CM | POA: Diagnosis present

## 2021-06-29 DIAGNOSIS — I700 Atherosclerosis of aorta: Secondary | ICD-10-CM | POA: Diagnosis present

## 2021-06-29 DIAGNOSIS — R531 Weakness: Secondary | ICD-10-CM

## 2021-06-29 DIAGNOSIS — T82898A Other specified complication of vascular prosthetic devices, implants and grafts, initial encounter: Secondary | ICD-10-CM

## 2021-06-29 DIAGNOSIS — Y732 Prosthetic and other implants, materials and accessory gastroenterology and urology devices associated with adverse incidents: Secondary | ICD-10-CM | POA: Diagnosis present

## 2021-06-29 DIAGNOSIS — R5383 Other fatigue: Secondary | ICD-10-CM

## 2021-06-29 DIAGNOSIS — Z8674 Personal history of sudden cardiac arrest: Secondary | ICD-10-CM

## 2021-06-29 DIAGNOSIS — I132 Hypertensive heart and chronic kidney disease with heart failure and with stage 5 chronic kidney disease, or end stage renal disease: Secondary | ICD-10-CM | POA: Diagnosis present

## 2021-06-29 DIAGNOSIS — J9811 Atelectasis: Secondary | ICD-10-CM | POA: Diagnosis present

## 2021-06-29 DIAGNOSIS — I5042 Chronic combined systolic (congestive) and diastolic (congestive) heart failure: Secondary | ICD-10-CM | POA: Diagnosis present

## 2021-06-29 DIAGNOSIS — Z9115 Patient's noncompliance with renal dialysis: Secondary | ICD-10-CM

## 2021-06-29 DIAGNOSIS — Z881 Allergy status to other antibiotic agents status: Secondary | ICD-10-CM

## 2021-06-29 DIAGNOSIS — Z20822 Contact with and (suspected) exposure to covid-19: Secondary | ICD-10-CM | POA: Diagnosis present

## 2021-06-29 DIAGNOSIS — Z91158 Patient's noncompliance with renal dialysis for other reason: Secondary | ICD-10-CM

## 2021-06-29 DIAGNOSIS — Z8249 Family history of ischemic heart disease and other diseases of the circulatory system: Secondary | ICD-10-CM

## 2021-06-29 DIAGNOSIS — I429 Cardiomyopathy, unspecified: Secondary | ICD-10-CM | POA: Diagnosis present

## 2021-06-29 DIAGNOSIS — I248 Other forms of acute ischemic heart disease: Secondary | ICD-10-CM | POA: Diagnosis present

## 2021-06-29 DIAGNOSIS — W19XXXA Unspecified fall, initial encounter: Secondary | ICD-10-CM | POA: Diagnosis present

## 2021-06-29 DIAGNOSIS — J90 Pleural effusion, not elsewhere classified: Secondary | ICD-10-CM | POA: Diagnosis present

## 2021-06-29 DIAGNOSIS — Z993 Dependence on wheelchair: Secondary | ICD-10-CM

## 2021-06-29 DIAGNOSIS — N390 Urinary tract infection, site not specified: Secondary | ICD-10-CM | POA: Diagnosis present

## 2021-06-29 DIAGNOSIS — N186 End stage renal disease: Secondary | ICD-10-CM

## 2021-06-29 DIAGNOSIS — Z905 Acquired absence of kidney: Secondary | ICD-10-CM

## 2021-06-29 DIAGNOSIS — D631 Anemia in chronic kidney disease: Secondary | ICD-10-CM | POA: Diagnosis present

## 2021-06-29 DIAGNOSIS — K8020 Calculus of gallbladder without cholecystitis without obstruction: Secondary | ICD-10-CM | POA: Diagnosis present

## 2021-06-29 DIAGNOSIS — Z8679 Personal history of other diseases of the circulatory system: Secondary | ICD-10-CM

## 2021-06-29 DIAGNOSIS — Z992 Dependence on renal dialysis: Secondary | ICD-10-CM

## 2021-06-29 DIAGNOSIS — R7989 Other specified abnormal findings of blood chemistry: Secondary | ICD-10-CM | POA: Diagnosis present

## 2021-06-29 DIAGNOSIS — J438 Other emphysema: Secondary | ICD-10-CM | POA: Diagnosis present

## 2021-06-29 LAB — CBC WITH DIFFERENTIAL/PLATELET
Abs Immature Granulocytes: 0.02 10*3/uL (ref 0.00–0.07)
Basophils Absolute: 0.1 10*3/uL (ref 0.0–0.1)
Basophils Relative: 1 %
Eosinophils Absolute: 0.3 10*3/uL (ref 0.0–0.5)
Eosinophils Relative: 5 %
HCT: 31.8 % — ABNORMAL LOW (ref 39.0–52.0)
Hemoglobin: 9.8 g/dL — ABNORMAL LOW (ref 13.0–17.0)
Immature Granulocytes: 0 %
Lymphocytes Relative: 19 %
Lymphs Abs: 1.1 10*3/uL (ref 0.7–4.0)
MCH: 31 pg (ref 26.0–34.0)
MCHC: 30.8 g/dL (ref 30.0–36.0)
MCV: 100.6 fL — ABNORMAL HIGH (ref 80.0–100.0)
Monocytes Absolute: 0.3 10*3/uL (ref 0.1–1.0)
Monocytes Relative: 5 %
Neutro Abs: 3.9 10*3/uL (ref 1.7–7.7)
Neutrophils Relative %: 70 %
Platelets: 247 10*3/uL (ref 150–400)
RBC: 3.16 MIL/uL — ABNORMAL LOW (ref 4.22–5.81)
RDW: 15.8 % — ABNORMAL HIGH (ref 11.5–15.5)
WBC: 5.7 10*3/uL (ref 4.0–10.5)
nRBC: 0 % (ref 0.0–0.2)
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
## 2021-06-29 NOTE — ED Triage Notes (Signed)
Pt presents via EMS from home with complaints of fatigue. Hx of Dialysis MWF; missed his dialysis appointment today, dialysis staff arrived at his home but did not make contact with the patient. HTN with EMS (166/106). Foul odor from the patient and home. Pt endorses mild pain at the site of his nephrostomy tube (right).  Denies CP or SOB.

## 2021-06-30 ENCOUNTER — Encounter: Payer: Self-pay | Admitting: Internal Medicine

## 2021-06-30 ENCOUNTER — Inpatient Hospital Stay: Payer: Medicaid Other

## 2021-06-30 ENCOUNTER — Emergency Department: Payer: Medicaid Other

## 2021-06-30 DIAGNOSIS — A419 Sepsis, unspecified organism: Secondary | ICD-10-CM | POA: Diagnosis present

## 2021-06-30 DIAGNOSIS — I10 Essential (primary) hypertension: Secondary | ICD-10-CM

## 2021-06-30 DIAGNOSIS — Z20822 Contact with and (suspected) exposure to covid-19: Secondary | ICD-10-CM | POA: Diagnosis present

## 2021-06-30 DIAGNOSIS — N390 Urinary tract infection, site not specified: Secondary | ICD-10-CM | POA: Diagnosis present

## 2021-06-30 DIAGNOSIS — N3000 Acute cystitis without hematuria: Secondary | ICD-10-CM

## 2021-06-30 DIAGNOSIS — N186 End stage renal disease: Secondary | ICD-10-CM

## 2021-06-30 DIAGNOSIS — Z8673 Personal history of transient ischemic attack (TIA), and cerebral infarction without residual deficits: Secondary | ICD-10-CM | POA: Diagnosis not present

## 2021-06-30 DIAGNOSIS — Z936 Other artificial openings of urinary tract status: Secondary | ICD-10-CM

## 2021-06-30 DIAGNOSIS — I248 Other forms of acute ischemic heart disease: Secondary | ICD-10-CM | POA: Diagnosis present

## 2021-06-30 DIAGNOSIS — Z8674 Personal history of sudden cardiac arrest: Secondary | ICD-10-CM | POA: Diagnosis not present

## 2021-06-30 DIAGNOSIS — Y732 Prosthetic and other implants, materials and accessory gastroenterology and urology devices associated with adverse incidents: Secondary | ICD-10-CM | POA: Diagnosis present

## 2021-06-30 DIAGNOSIS — I700 Atherosclerosis of aorta: Secondary | ICD-10-CM | POA: Diagnosis present

## 2021-06-30 DIAGNOSIS — F17210 Nicotine dependence, cigarettes, uncomplicated: Secondary | ICD-10-CM | POA: Diagnosis present

## 2021-06-30 DIAGNOSIS — I5042 Chronic combined systolic (congestive) and diastolic (congestive) heart failure: Secondary | ICD-10-CM | POA: Diagnosis present

## 2021-06-30 DIAGNOSIS — W19XXXA Unspecified fall, initial encounter: Secondary | ICD-10-CM | POA: Diagnosis present

## 2021-06-30 DIAGNOSIS — Z993 Dependence on wheelchair: Secondary | ICD-10-CM | POA: Diagnosis not present

## 2021-06-30 DIAGNOSIS — J90 Pleural effusion, not elsewhere classified: Secondary | ICD-10-CM | POA: Diagnosis present

## 2021-06-30 DIAGNOSIS — Z992 Dependence on renal dialysis: Secondary | ICD-10-CM | POA: Diagnosis not present

## 2021-06-30 DIAGNOSIS — I272 Pulmonary hypertension, unspecified: Secondary | ICD-10-CM | POA: Diagnosis present

## 2021-06-30 DIAGNOSIS — K8020 Calculus of gallbladder without cholecystitis without obstruction: Secondary | ICD-10-CM | POA: Diagnosis present

## 2021-06-30 DIAGNOSIS — J9811 Atelectasis: Secondary | ICD-10-CM | POA: Diagnosis present

## 2021-06-30 DIAGNOSIS — T83098A Other mechanical complication of other indwelling urethral catheter, initial encounter: Secondary | ICD-10-CM | POA: Diagnosis not present

## 2021-06-30 DIAGNOSIS — T83012A Breakdown (mechanical) of nephrostomy catheter, initial encounter: Secondary | ICD-10-CM | POA: Diagnosis present

## 2021-06-30 DIAGNOSIS — R778 Other specified abnormalities of plasma proteins: Secondary | ICD-10-CM

## 2021-06-30 DIAGNOSIS — R531 Weakness: Secondary | ICD-10-CM | POA: Diagnosis present

## 2021-06-30 DIAGNOSIS — I132 Hypertensive heart and chronic kidney disease with heart failure and with stage 5 chronic kidney disease, or end stage renal disease: Secondary | ICD-10-CM | POA: Diagnosis present

## 2021-06-30 DIAGNOSIS — I429 Cardiomyopathy, unspecified: Secondary | ICD-10-CM | POA: Diagnosis present

## 2021-06-30 DIAGNOSIS — Z9115 Patient's noncompliance with renal dialysis: Secondary | ICD-10-CM | POA: Diagnosis not present

## 2021-06-30 DIAGNOSIS — D631 Anemia in chronic kidney disease: Secondary | ICD-10-CM | POA: Diagnosis present

## 2021-06-30 DIAGNOSIS — N2581 Secondary hyperparathyroidism of renal origin: Secondary | ICD-10-CM | POA: Diagnosis present

## 2021-06-30 DIAGNOSIS — Z8616 Personal history of COVID-19: Secondary | ICD-10-CM | POA: Diagnosis not present

## 2021-06-30 DIAGNOSIS — J438 Other emphysema: Secondary | ICD-10-CM | POA: Diagnosis present

## 2021-06-30 LAB — URINALYSIS, ROUTINE W REFLEX MICROSCOPIC
Bacteria, UA: NONE SEEN
Bilirubin Urine: NEGATIVE
Glucose, UA: NEGATIVE mg/dL
Ketones, ur: NEGATIVE mg/dL
Nitrite: NEGATIVE
Protein, ur: >=300 mg/dL — AB
Specific Gravity, Urine: 1.014 (ref 1.005–1.030)
WBC, UA: >50 WBC/hpf — ABNORMAL HIGH (ref 0–5)
pH: 8 (ref 5.0–8.0)

## 2021-06-30 LAB — PROTIME-INR
INR: 1.3 — ABNORMAL HIGH (ref 0.8–1.2)
Prothrombin Time: 16 seconds — ABNORMAL HIGH (ref 11.4–15.2)

## 2021-06-30 LAB — COMPREHENSIVE METABOLIC PANEL
ALT: 20 U/L (ref 0–44)
AST: 24 U/L (ref 15–41)
Albumin: 2.9 g/dL — ABNORMAL LOW (ref 3.5–5.0)
Alkaline Phosphatase: 116 U/L (ref 38–126)
Anion gap: 11 (ref 5–15)
BUN: 59 mg/dL — ABNORMAL HIGH (ref 6–20)
CO2: 24 mmol/L (ref 22–32)
Calcium: 8.1 mg/dL — ABNORMAL LOW (ref 8.9–10.3)
Chloride: 108 mmol/L (ref 98–111)
Creatinine, Ser: 7.28 mg/dL — ABNORMAL HIGH (ref 0.61–1.24)
GFR, Estimated: 8 mL/min — ABNORMAL LOW (ref >60)
Glucose, Bld: 84 mg/dL (ref 70–99)
Potassium: 4.4 mmol/L (ref 3.5–5.1)
Sodium: 143 mmol/L (ref 135–145)
Total Bilirubin: 0.6 mg/dL (ref 0.3–1.2)
Total Protein: 8.8 g/dL — ABNORMAL HIGH (ref 6.5–8.1)

## 2021-06-30 LAB — HEMOGLOBIN A1C
Hgb A1c MFr Bld: 5 % (ref 4.8–5.6)
Mean Plasma Glucose: 96.8 mg/dL

## 2021-06-30 LAB — LACTIC ACID, PLASMA
Lactic Acid, Venous: 2 mmol/L (ref 0.5–1.9)
Lactic Acid, Venous: 2 mmol/L (ref 0.5–1.9)
Lactic Acid, Venous: 1.9 mmol/L (ref 0.5–1.9)
Lactic Acid, Venous: 2.3 mmol/L (ref 0.5–1.9)

## 2021-06-30 LAB — BODY FLUID CELL COUNT WITH DIFFERENTIAL
Eos, Fluid: 0 %
Lymphs, Fluid: 78 %
Monocyte-Macrophage-Serous Fluid: 17 % — ABNORMAL LOW (ref 50–90)
Neutrophil Count, Fluid: 5 % (ref 0–25)
Total Nucleated Cell Count, Fluid: 131 uL (ref 0–1000)

## 2021-06-30 LAB — LACTATE DEHYDROGENASE, PLEURAL OR PERITONEAL FLUID: LD, Fluid: 181 U/L — ABNORMAL HIGH (ref 3–23)

## 2021-06-30 LAB — T4, FREE: Free T4: 0.9 ng/dL (ref 0.61–1.12)

## 2021-06-30 LAB — TSH: TSH: 10.019 u[IU]/mL — ABNORMAL HIGH (ref 0.350–4.500)

## 2021-06-30 LAB — ALBUMIN, PLEURAL OR PERITONEAL FLUID: Albumin, Fluid: <1.5 g/dL

## 2021-06-30 LAB — HIV ANTIBODY (ROUTINE TESTING W REFLEX): HIV Screen 4th Generation wRfx: NONREACTIVE

## 2021-06-30 LAB — GLUCOSE, PLEURAL OR PERITONEAL FLUID: Glucose, Fluid: 42 mg/dL

## 2021-06-30 LAB — PROTEIN, PLEURAL OR PERITONEAL FLUID: Total protein, fluid: 3 g/dL

## 2021-06-30 LAB — PROCALCITONIN: Procalcitonin: 1.05 ng/mL

## 2021-06-30 LAB — RESP PANEL BY RT-PCR (FLU A&B, COVID) ARPGX2
Influenza A by PCR: NEGATIVE
Influenza B by PCR: NEGATIVE
SARS Coronavirus 2 by RT PCR: NEGATIVE

## 2021-06-30 LAB — TROPONIN I (HIGH SENSITIVITY)
Troponin I (High Sensitivity): 132 ng/L (ref <18)
Troponin I (High Sensitivity): 115 ng/L (ref <18)
Troponin I (High Sensitivity): 108 ng/L (ref <18)
Troponin I (High Sensitivity): 95 ng/L — ABNORMAL HIGH (ref <18)

## 2021-06-30 LAB — APTT: aPTT: 36 seconds (ref 24–36)

## 2021-06-30 MED ORDER — LACTATED RINGERS IV BOLUS (SEPSIS): 250.0000 mL | Freq: Once | INTRAVENOUS | Status: DC

## 2021-06-30 MED ORDER — LACTATED RINGERS IV BOLUS (SEPSIS): 1000.0000 mL | Freq: Once | INTRAVENOUS | Status: DC

## 2021-06-30 MED ORDER — NICOTINE 21 MG/24HR TD PT24
21.0000 mg | MEDICATED_PATCH | Freq: Every day | TRANSDERMAL | Status: DC
  Filled 2021-06-30: qty 1

## 2021-06-30 MED ORDER — LACTATED RINGERS IV BOLUS (SEPSIS): 500.0000 mL | Freq: Once | INTRAVENOUS | Status: DC

## 2021-06-30 MED ORDER — ACETAMINOPHEN 325 MG PO TABS: 650.0000 mg | ORAL_TABLET | Freq: Four times a day (QID) | ORAL | Status: DC | PRN

## 2021-06-30 MED ORDER — RENA-VITE PO TABS
1.0000 | ORAL_TABLET | Freq: Every day | ORAL | Status: DC
  Administered 2021-06-30 – 2021-07-01 (×2): 1 via ORAL
  Filled 2021-06-30 (×4): qty 1

## 2021-06-30 MED ORDER — SODIUM CHLORIDE 0.9 % IV SOLN
1.0000 g | INTRAVENOUS | Status: DC
  Administered 2021-06-30 – 2021-07-02 (×3): 1 g via INTRAVENOUS
  Filled 2021-06-30 (×3): qty 1

## 2021-06-30 MED ORDER — IOHEXOL 300 MG/ML  SOLN
75.0000 mL | Freq: Once | INTRAMUSCULAR | Status: AC | PRN
  Administered 2021-06-30: 75 mL via INTRAVENOUS

## 2021-06-30 MED ORDER — DM-GUAIFENESIN ER 30-600 MG PO TB12
1.0000 | ORAL_TABLET | Freq: Two times a day (BID) | ORAL | Status: DC | PRN
  Filled 2021-06-30: qty 1

## 2021-06-30 MED ORDER — PROSOURCE PLUS PO LIQD
30.0000 mL | Freq: Two times a day (BID) | ORAL | Status: DC
  Filled 2021-06-30 (×2): qty 30

## 2021-06-30 MED ORDER — HYDRALAZINE HCL 20 MG/ML IJ SOLN
5.0000 mg | INTRAMUSCULAR | Status: DC | PRN
  Administered 2021-06-30: 5 mg via INTRAVENOUS
  Filled 2021-06-30: qty 1

## 2021-06-30 MED ORDER — SODIUM CHLORIDE 0.9 % IV SOLN
2.0000 g | Freq: Once | INTRAVENOUS | Status: DC
  Filled 2021-06-30: qty 2

## 2021-06-30 MED ORDER — CHLORHEXIDINE GLUCONATE CLOTH 2 % EX PADS
6.0000 | MEDICATED_PAD | Freq: Every day | CUTANEOUS | Status: DC
  Administered 2021-06-30 – 2021-07-02 (×2): 6 via TOPICAL

## 2021-06-30 MED ORDER — BOOST / RESOURCE BREEZE PO LIQD CUSTOM: 1.0000 | Freq: Two times a day (BID) | ORAL | Status: DC

## 2021-06-30 MED ORDER — ALBUTEROL SULFATE (2.5 MG/3ML) 0.083% IN NEBU: 3.0000 mL | INHALATION_SOLUTION | RESPIRATORY_TRACT | Status: DC | PRN

## 2021-06-30 NOTE — Sepsis Progress Note (Signed)
[facility] following Code Sepsis. 

## 2021-06-30 NOTE — TOC Initial Note (Signed)
Transition of Care (TOC) - Initial/Assessment Note    Patient Details  Name: Larry Morrison MRN: [id number] Date of Birth: 03/13/1971  Transition of Care [facility]) CM/SW Contact:    Conception Oms, RN Phone Number: 06/30/2021, 10:32 AM  Clinical Narrative:           Patient comes from home, He has a HX of Dialysis MWF, he missed dialysis yesterday due to sliding out of chair onto floor and too weak to get up , He has a nephrostomy tube on the right, PT to eval, TOC to follow for needs and DC planning             Patient Goals and CMS Choice        Expected Discharge Plan and Services                                                Prior Living Arrangements/Services                       Activities of Daily Living      Permission Sought/Granted                  Emotional Assessment              Admission diagnosis:  UTI (urinary tract infection) [N39.0] ESRD (end stage renal disease) on dialysis ([place]) [N18.6, Z99.2] Malfunction of nephrostomy tube ([place]) [T83.098A] Generalized weakness [R53.1] Urinary tract infection without hematuria, site unspecified [N39.0] Other fatigue [R53.83] Sepsis, due to unspecified organism, unspecified whether acute organ dysfunction present [facility]) [A41.9] Patient Active Problem List   Diagnosis Date Noted   UTI (urinary tract infection) 06/30/2021   Chronic combined systolic and diastolic heart failure ([place]) 06/30/2021   Fall 06/30/2021   ESRD (end stage renal disease) on dialysis [facility])    Malfunction of nephrostomy tube ([place])    Acute respiratory failure with hypoxia ([place])    Shortness of breath    Malnutrition of moderate degree 04/11/2021   Cardiac arrest ([place]) 04/11/2021   Acute on chronic combined systolic and diastolic CHF (congestive heart failure) ([place]) 04/09/2021   Noncompliance of patient with renal dialysis ([place]) 04/09/2021   Protein-calorie malnutrition, severe 03/01/2021   Arteriovenous  fistula occlusion (HCC)    Pleural effusion 02/28/2021   Arteriovenous fistula thrombosis ([place]) 93/81/8299   Metabolic acidosis 37/16/9678   History of traumatic brain injury 09/07/2020   Cognitive impairment 93/81/0175   Metabolic encephalopathy 03/15/8526   ESRD needing dialysis ([place]) 09/04/2020   Hypoglycemia 09/04/2020   Uremia 09/04/2020   COVID-19 virus infection 06/18/2020   Thrombocytopenia ([place]) 06/18/2020   Elevated troponin 06/18/2020   Hyperkalemia 06/13/2020   Anemia in ESRD (end-stage renal disease) ([place]) 09/17/2019   Essential hypertension 07/15/2019   Testicular torsion    Pyuria 06/10/2016   Chronic pain following surgery or procedure 12/01/2015   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[place]) 12/01/2015   Tobacco use disorder 12/01/2015   ESRD (end stage renal disease) ([place]) 04/28/2014   Obstructed nephrostomy tube ([place]) 10/23/2013   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   Neurogenic bladder 02/09/1998   Urinary calculus 02/09/1998   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   PCP:  Physicians, [place]:   [place], [place] - [place]  [address] Phone: [phone] Fax: [phone]     Social Determinants of Health (SDOH) Interventions    Readmission Risk Interventions Readmission Risk Prevention Plan 04/12/2021 09/08/2020  Transportation Screening Complete Complete  Social Work Consult for [place] Planning/Counseling - Complete  Palliative Care Screening - Complete  Medication Review Press photographer) Complete Complete  PCP or Specialist appointment within 3-5 days of discharge Complete -  [place] or [place] Complete -  [place] or [place] Pt Refusal Comments has in home care aids -  SW Recovery Care/Counseling Consult Not Complete -  SW Consult Not Complete Comments pt intubated -  Palliative Care Screening Not Complete -  Comments  intubated -  Skilled Nursing Facility Not Complete -  SNF Comments intubated -  Some recent data might be hidden

## 2021-06-30 NOTE — Progress Notes (Signed)
MD notified that pt's BP is elevated this AM at 172/120. MD placed order for 5mg  hydralazine STAT, RN administered medication per order. RN will recheck BP shortly.

## 2021-06-30 NOTE — Progress Notes (Signed)
CODE SEPSIS - PHARMACY COMMUNICATION  **[facility] Spectrum Antibiotics should be administered within 1 hour of Sepsis diagnosis**  Time Code Sepsis Called/Page Received: 0130  Antibiotics Ordered: Cefepime  Time of 1st antibiotic administration: Dose remains uncharted at this time.  Code sepsis initiated just prior to almost 5 hr downtime procedure event.   Renda Rolls, PharmD, [facility] 06/30/2021 6:29 AM

## 2021-06-30 NOTE — Progress Notes (Signed)
[facility]  ROUNDING NOTE   Subjective:   Larry Morrison is a 51 year old American male with a past medical history including anemia, hypertension, pleural effusions, pneumonia, stroke, and end-stage renal disease on hemodialysis.  Patient presents to the emergency department after being found at home on the floor after sliding out of his wheelchair.  He has been admitted for UTI (urinary tract infection) [N39.0] ESRD (end stage renal disease) on dialysis ([place]) [N18.6, Z99.2] Malfunction of nephrostomy tube ([place]) [T83.098A] Generalized weakness [R53.1] Urinary tract infection without hematuria, site unspecified [N39.0] Other fatigue [R53.83] Sepsis, due to unspecified organism, unspecified whether acute organ dysfunction present [facility]) [A41.9]   Patient is known to our practice and receives outpatient dialysis at [facility] on a MWF schedule, supervised by Dr. Juleen China.  Patient does report missing dialysis treatment on Wednesday.  Patient states he was trying to get to the door for transportation and slid out of wheelchair, was too weak to get up.  Currently lives with brother who was at work at the time.  Denies nausea and vomiting.  Denies fever and chills.  Reports pain at right nephrostomy tube site.    Labs unremarkable for renal patient. Chest x-ray shows moderate bilateral pleural effusions.    Objective:  Vital signs in last 24 hours:  Temp:  [97.9 F (36.6 C)-98.3 F (36.8 C)] 98.2 F (36.8 C) (02/09 1144) Pulse Rate:  [81-87] 82 (02/09 1144) Resp:  [14-44] 18 (02/09 1144) BP: (148-180)/(105-132) 148/107 (02/09 1144) SpO2:  [91 %-100 %] 97 % (02/09 1144) Weight:  [52.3 kg] 52.3 kg (02/08 2250)  Weight change:  Filed Weights   06/29/21 2250  Weight: 52.3 kg    Intake/Output: No intake/output data recorded.   Intake/Output this shift:  No intake/output data recorded.  Physical Exam: General: NAD  Head: Normocephalic, atraumatic. Moist oral  mucosal membranes  Eyes: Anicteric  Lungs:  Clear to auscultation, normal effort  Heart: Regular rate and rhythm  Abdomen:  Soft, nontender, Rt ileostomy tube  Extremities:  trace peripheral edema.  Neurologic: Nonfocal, moving all four extremities  Skin: No lesions  Access: Lt IJ permcath    Basic Metabolic Panel: Recent Labs  Lab 06/29/21 2329  NA 143  K 4.4  CL 108  CO2 24  GLUCOSE 84  BUN 59*  CREATININE 7.28*  CALCIUM 8.1*    Liver Function Tests: Recent Labs  Lab 06/29/21 2329  AST 24  ALT 20  ALKPHOS 116  BILITOT 0.6  PROT 8.8*  ALBUMIN 2.9*   No results for input(s): LIPASE, AMYLASE in the last 168 hours. No results for input(s): AMMONIA in the last 168 hours.  CBC: Recent Labs  Lab 06/29/21 2329  WBC 5.7  NEUTROABS 3.9  HGB 9.8*  HCT 31.8*  MCV 100.6*  PLT 247    Cardiac Enzymes: No results for input(s): CKTOTAL, CKMB, CKMBINDEX, TROPONINI in the last 168 hours.  BNP: Invalid input(s): POCBNP  CBG: No results for input(s): GLUCAP in the last 168 hours.  Microbiology: Results for orders placed or performed during the hospital encounter of 06/29/21  Culture, blood (routine x 2)     Status: None (Preliminary result)   Collection Time: 06/29/21 11:29 PM   Specimen: BLOOD  Result Value Ref Range Status   Specimen Description BLOOD RIGHT FOREARM  Final   Special Requests   Final    BOTTLES DRAWN AEROBIC AND ANAEROBIC Blood Culture adequate volume   Culture   Final  NO GROWTH < 12 HOURS Performed at [facility], [address] [place], [place] [address]    Report Status PENDING  Incomplete  Culture, blood (routine x 2)     Status: None (Preliminary result)   Collection Time: 06/29/21 11:29 PM   Specimen: BLOOD  Result Value Ref Range Status   Specimen Description BLOOD RIGHT ARM  Final   Special Requests IN PEDIATRIC BOTTLE Blood Culture adequate volume  Final   Culture   Final    NO GROWTH < 12 HOURS Performed at  [facility], [address], [place], [place] [address]    Report Status PENDING  Incomplete  Resp Panel by RT-PCR (Flu A&B, Covid)     Status: None   Collection Time: 06/29/21 11:49 PM   Specimen: Nasopharyngeal(NP) swabs in vial transport medium  Result Value Ref Range Status   SARS Coronavirus 2 by RT PCR NEGATIVE NEGATIVE Final    Comment: (NOTE) SARS-CoV-2 target nucleic acids are NOT DETECTED.  The SARS-CoV-2 RNA is generally detectable in upper respiratory specimens during the acute phase of infection. The lowest concentration of SARS-CoV-2 viral copies this assay can detect is 138 copies/mL. A negative result does not preclude SARS-Cov-2 infection and should not be used as the sole basis for treatment or other patient management decisions. A negative result may occur with  improper specimen collection/handling, submission of specimen other than nasopharyngeal swab, presence of viral mutation(s) within the areas targeted by this assay, and inadequate number of viral copies(<138 copies/mL). A negative result must be combined with clinical observations, patient history, and epidemiological information. The expected result is Negative.  Fact Sheet for Patients:  EntrepreneurPulse.com.au  Fact Sheet for Healthcare Providers:  IncredibleEmployment.be  This test is no t yet approved or cleared by the [place] FDA and  has been authorized for detection and/or diagnosis of SARS-CoV-2 by FDA under an Emergency Use Authorization (EUA). This EUA will remain  in effect (meaning this test can be used) for the duration of the COVID-19 declaration under Section 564(b)(1) of the Act, 21 U.S.C.section 360bbb-3(b)(1), unless the authorization is terminated  or revoked sooner.       Influenza A by PCR NEGATIVE NEGATIVE Final   Influenza B by PCR NEGATIVE NEGATIVE Final    Comment: (NOTE) The Xpert Xpress SARS-CoV-2/FLU/RSV plus assay  is intended as an aid in the diagnosis of influenza from Nasopharyngeal swab specimens and should not be used as a sole basis for treatment. Nasal washings and aspirates are unacceptable for Xpert Xpress SARS-CoV-2/FLU/RSV testing.  Fact Sheet for Patients: EntrepreneurPulse.com.au  Fact Sheet for Healthcare Providers: IncredibleEmployment.be  This test is not yet approved or cleared by the [place] FDA and has been authorized for detection and/or diagnosis of SARS-CoV-2 by FDA under an Emergency Use Authorization (EUA). This EUA will remain in effect (meaning this test can be used) for the duration of the COVID-19 declaration under Section 564(b)(1) of the Act, 21 U.S.C. section 360bbb-3(b)(1), unless the authorization is terminated or revoked.  Performed at [facility], [address] [place],  [address]     Coagulation Studies: Recent Labs    06/30/21 3976  LABPROT 16.0*  INR 1.3*    Urinalysis: Recent Labs    06/29/21 2349  COLORURINE YELLOW*  LABSPEC 1.014  PHURINE 8.0  GLUCOSEU NEGATIVE  HGBUR SMALL*  BILIRUBINUR NEGATIVE  KETONESUR NEGATIVE  PROTEINUR >=300*  NITRITE NEGATIVE  LEUKOCYTESUR LARGE*      Imaging: CT Chest  W Contrast  Result Date: 06/30/2021 CLINICAL DATA:  Pleural effusion, malignancy suspected. Fatigue. End-stage renal disease. EXAM: CT CHEST WITH CONTRAST TECHNIQUE: Multidetector CT imaging of the chest was performed during intravenous contrast administration. RADIATION DOSE REDUCTION: This exam was performed according to the departmental dose-optimization program which includes automated exposure control, adjustment of the mA and/or kV according to patient size and/or use of iterative reconstruction technique. CONTRAST:  18mL OMNIPAQUE IOHEXOL 300 MG/ML  SOLN COMPARISON:  None. FINDINGS: Cardiovascular: No significant coronary artery calcification. Mild global cardiomegaly. No  pericardial effusion. Central pulmonary arteries are of normal caliber. Mild atherosclerotic calcification within the thoracic aorta. No aortic aneurysm. Bovine arch anatomy. Arch vasculature is widely patent proximally. Stent graft is seen within the right basilic and axillary veins with extensive intimal hyperplasia within the stent lumen. The vessel, however, is still patent. Right internal jugular hemodialysis catheter is seen with its tips within the superior vena cava and right atrium. Mediastinum/Nodes: Thyroid unremarkable. No pathologic thoracic adenopathy. The esophagus is unremarkable. Small hiatal hernia. Lungs/Pleura: Multiloculated right pleural effusion is seen with small apicolateral and moderate basilar components. Moderate left pleural effusion is present dependently. There is compressive atelectasis of the lower lobes bilaterally. Superimposed consolidation within the left lower lobe noted peripherally posterior like ting changes of acute infection or rounded atelectasis. Mild biapical paraseptal emphysema. No central obstructing lesion. Upper Abdomen: Right percutaneous nephrostomy is partially visualized and appears to have been withdrawn with its cope loop now external to the lower pole the right kidney. Cholelithiasis noted. Musculoskeletal: No acute bone abnormality. No lytic or blastic bone lesion. IMPRESSION: Multiloculated right and dependent left moderate pleural effusions with associated bibasilar compressive atelectasis. Superimposed consolidation within the left lower lobe may reflect changes of acute infection or, more likely, rounded atelectasis. Correlation with the patient's clinical examination would be helpful in differentiating these entities. Mild global cardiomegaly. Right percutaneous nephrostomy is partially visualized but appears withdrawn from the right renal collecting system with its cope loop positioned adjacent to the lower pole the right kidney. Revision or removal is  recommended. Cholelithiasis Aortic Atherosclerosis (ICD10-I70.0) and Emphysema (ICD10-J43.9). Electronically Signed   By: Fidela Salisbury M.D.   On: 06/30/2021 01:43   DG Chest Port 1 View  Result Date: 06/30/2021 CLINICAL DATA:  Status post left-sided thoracentesis EXAM: PORTABLE CHEST 1 VIEW COMPARISON:  06/29/2021 FINDINGS: Right IJ hemodialysis catheter remains in place. Stable cardiomegaly. Moderate bilateral pleural effusions which may be minimally decreased on the left. Persistent left basilar opacity. No pneumothorax. Right subclavian vascular stent noted. IMPRESSION: Moderate bilateral pleural effusions which may be minimally decreased on the left. No pneumothorax. Electronically Signed   By: Davina Poke D.O.   On: 06/30/2021 11:28   DG Chest Port 1 View  Result Date: 06/29/2021 CLINICAL DATA:  Weakness and fatigue. EXAM: PORTABLE CHEST 1 VIEW COMPARISON:  Portable chest 04/16/2021, 04/13/2021. FINDINGS: A dialysis catheter via right IJ approach is again noted terminating in the upper right atrium. There is a vascular stent in the right axilla. Interval extubation and removal NGT. The heart is moderately enlarged and slight central vascular fullness is seen without overt edema. There are moderate-sized layering pleural effusions. The effusion on the right has increased in the interval, on left has decreased in the interval. There is a focal pleural-based opacity in the peripheral left mid lung just above the pleural fluid, showing parenchymal stranding medial to it. Hazy atelectasis versus pneumonitis is noted in the lower lung fields alongside the effusions.  The upper lung fields are clear. Thoracic cage is grossly intact. IMPRESSION: 1. Moderate-sized layering pleural effusions. 2. Cardiomegaly with mild central vascular prominence without visible edema. 3. 5.6 x 3 cm pleural-based opacity lateral peripheral left mid field with adjacent stranding. This does appear to have been present on  04/13/2021 but was obscured by the larger volume of left pleural fluid on the 04/16/2021 exam. Differential diagnosis is rounded atelectasis, loculated fluid, or a pleural-parenchymal mass lesion. CT is recommended, preferably with contrast if the patient can tolerate contrast. 4. Hazy atelectasis or infiltrates alongside the effusions in the lower lung fields. Electronically Signed   By: Telford Nab M.D.   On: 06/29/2021 23:37   US THORACENTESIS ASP PLEURAL SPACE W/IMG GUIDE  Result Date: 06/30/2021 INDICATION: History of ESRD on HD with recurrent left pleural effusion. Request for diagnostic therapeutic thoracentesis. EXAM: ULTRASOUND GUIDED DIAGNOSTIC AND THERAPEUTIC LEFT THORACENTESIS MEDICATIONS: None. COMPLICATIONS: 10 mL 1 % lidocaine PROCEDURE: An ultrasound guided thoracentesis was thoroughly discussed with the patient and questions answered. The benefits, risks, alternatives and complications were also discussed. The patient understands and wishes to proceed with the procedure. Written consent was obtained. Ultrasound was performed to localize and mark an adequate pocket of fluid in the left chest. The area was then prepped and draped in the normal sterile fashion. 1% Lidocaine was used for local anesthesia. Under ultrasound guidance a 6 Fr Safe-T-Centesis catheter was introduced. Thoracentesis was performed. The catheter was removed and a dressing applied. FINDINGS: A total of approximately 500 cc of serosanguineous fluid was removed. Samples were sent to the laboratory as requested by the clinical team. IMPRESSION: Successful ultrasound guided left thoracentesis yielding 500 cc of pleural fluid. Read by: Narda Rutherford, AGNP-BC Electronically Signed   By: Albin Felling M.D.   On: 06/30/2021 10:45     Medications:    ceFEPime (MAXIPIME) IV     ceFEPime (MAXIPIME) IV      nicotine  21 mg Transdermal Daily   acetaminophen, albuterol, dextromethorphan-guaiFENesin, hydrALAZINE  Assessment/  Plan:  Larry Morrison is a 51 y.o.  male a past medical history including anemia, hypertension, pleural effusions, pneumonia, stroke, and end-stage renal disease on hemodialysis.  Patient presents to the emergency department after being found at home on the floor after sliding out of his wheelchair.  He has been admitted for UTI (urinary tract infection) [N39.0] ESRD (end stage renal disease) on dialysis ([place]) [N18.6, Z99.2] Malfunction of nephrostomy tube ([place]) [T83.098A] Generalized weakness [R53.1] Urinary tract infection without hematuria, site unspecified [N39.0] Other fatigue [R53.83] Sepsis, due to unspecified organism, unspecified whether acute organ dysfunction present [facility]) [A41.9]  [facility] MWF [facility] Left IJ permcath 49kg  End-stage renal disease on hemodialysis.  Will maintain outpatient schedule during this admission.  Patient reports missing last treatment on Wednesday, patient appears stable.  Next treatment scheduled for Friday to maintain outpatient schedule.  2. Anemia of chronic kidney disease Macrocytic Macera prescribed outpatient Hemoglobin at goal at 9.8.  3. Secondary Hyperparathyroidism: with outpatient labs: phosphorus 6.2, calcium 8.5 on 04/27/21.   Lab Results  Component Value Date   CALCIUM 8.1 (L) 06/29/2021   CAION 0.76 (LL) 08/07/2019   PHOS 4.3 04/18/2021  Calcium and phosphorus within acceptable range. We will continue to monitor bone minerals during this admission    LOS: 0 [place] 2/9/202312:26 PM

## 2021-06-30 NOTE — Progress Notes (Addendum)
Nutrition Follow-up  DOCUMENTATION CODES:   Severe malnutrition in context of chronic illness  INTERVENTION:   -Boost Breeze po BID, each supplement provides 250 kcal and 9 grams of protein  -30 ml Prosource Plus BID, each supplement provides 100 kcals and 15 grams protein -Renal MVI daily -Double protein portions with meals  NUTRITION DIAGNOSIS:   Severe Malnutrition related to chronic illness (ESRD on HD) as evidenced by severe fat depletion, severe muscle depletion, percent weight loss.  GOAL:   Patient will meet greater than or equal to 90% of their needs  MONITOR:   PO intake, Supplement acceptance, Labs, Weight trends, Skin, I & O's  REASON FOR ASSESSMENT:   Malnutrition Screening Tool    ASSESSMENT:   Larry Morrison is a 51 year old American male with a past medical history including anemia, hypertension, pleural effusions, pneumonia, stroke, and end-stage renal disease on hemodialysis.  Patient presents to the emergency department after being found at home on the floor after sliding out of his wheelchair  Pt admitted with UTI.   2/9- s/p lt thoracentesis (500 ml fluid removed)  Reviewed I/O's: +100 ml x 24 hours  UOP: 140 ml x 24 hours  Per MD notes, plan NPO for nephrostomy tube change tomorrow.   Spoke with pt at bedside, who was eager to eat today. Pt just got advanced to a renal diet. Pt reports good appetite at home and usually consumes 3 meals per day, either microwave meals or "whatever I want". Pt states his brother lives with him and will cook him what he wants, including fried chicken. Noted documented meal completions 80%  Pt unsure if he has lost weight and unsure of EDW. Per nephrology notes, EDW 49 kg. Reviewed wt hx; pt has experienced a 23% wt loss over the past 3 months, which is significant for time frame.   Discussed importance of good meal and supplement intake to promote healing.   Labs reviewed.    NUTRITION - FOCUSED PHYSICAL  EXAM:  Flowsheet Row Most Recent Value  Orbital Region Mild depletion  Upper Arm Region Severe depletion  Thoracic and Lumbar Region Severe depletion  Buccal Region Mild depletion  Temple Region Moderate depletion  Clavicle Bone Region Moderate depletion  Clavicle and Acromion Bone Region Severe depletion  Scapular Bone Region Severe depletion  Dorsal Hand Moderate depletion  Patellar Region Moderate depletion  Anterior Thigh Region Moderate depletion  Posterior Calf Region Moderate depletion  Edema (RD Assessment) Mild  Hair Reviewed  Eyes Reviewed  Mouth Reviewed  Skin Reviewed  Nails Reviewed       Diet Order:   Diet Order             Diet NPO time specified  Diet effective midnight           Diet renal with fluid restriction Fluid restriction: 1200 mL Fluid; Room service appropriate? Yes; Fluid consistency: Thin  Diet effective now                   EDUCATION NEEDS:   Education needs have been addressed  Skin:  Skin Assessment: Reviewed RN Assessment  Last BM:  06/30/21  Height:   Ht Readings from Last 1 Encounters:  06/29/21 5\' 2"  (1.575 m)    Weight:   Wt Readings from Last 1 Encounters:  06/29/21 52.3 kg    Ideal Body Weight:  53.6 kg  BMI:  Body mass index is 21.09 kg/m.  Estimated Nutritional Needs:   Kcal:  5701-7793  Protein:  105-120 grams  Fluid:  1000 ml + UOP    [name], RD, LDN, [place] Registered Dietitian II Certified Diabetes Care and Education Specialist Please refer to [facility] for RD and/or RD on-call/weekend/after hours pager

## 2021-06-30 NOTE — Procedures (Signed)
PROCEDURE SUMMARY:  Successful US guided left diagnostic and therapeutic thoracentesis. Yielded 500 cc of serosanguinous fluid. Pt tolerated procedure well. No immediate complications.  Specimen was sent for labs. CXR ordered.  EBL < 1 mL  Tyson Alias, AGNP 06/30/2021 10:39 AM

## 2021-06-30 NOTE — Progress Notes (Signed)
Pharmacy Antibiotic Note  Larry Morrison is a 51 y.o. male admitted on 06/29/2021 with UTI/nephrostomy tube dysfunction.  Pharmacy has been consulted for Cefepime dosing.  Plan: Cefepime 2 gm IV ordered in ED Will continue Cefepime 1 gram IV q24h in the evenings (HD dosing)   Height: 5\' 2"  (157.5 cm) Weight: 52.3 kg (115 lb 4.8 oz) IBW/kg (Calculated) : 54.6  Temp (24hrs), Avg:98.1 F (36.7 C), Min:97.9 F (36.6 C), Max:98.3 F (36.8 C)  Recent Labs  Lab 06/29/21 2329 06/30/21 0127 06/30/21 0907  WBC 5.7  --   --   CREATININE 7.28*  --   --   LATICACIDVEN 2.0* 2.0* 1.9    Estimated Creatinine Clearance: 8.8 mL/min (A) (by C-G formula based on SCr of 7.28 mg/dL (H)).    Allergies  Allergen Reactions   Vancomycin     Patient denies - repeated denial to pharm tech 09-05-2020    Antimicrobials this admission: cefepime 2/9 >>   Dose adjustments this admission:    Microbiology results: 2/8 BCx: NG <12 2/8 UCx:       Sputum:      MRSA PCR: ordered 2/9 pleural fluid cx:   Thank you for allowing pharmacy to be a part of this patients care.  Arlee Bossard A 06/30/2021 1:23 PM

## 2021-06-30 NOTE — Progress Notes (Signed)
PHARMACY -  BRIEF ANTIBIOTIC NOTE   Pharmacy has received consult(s) for Cefepime from an ED provider.  The patient's profile has been reviewed for ht/wt/allergies/indication/available labs.    One time order(s) placed for Cefepime 2 gm  Further antibiotics/pharmacy consults should be ordered by admitting physician if indicated.                       Thank you, Renda Rolls, PharmD, [facility] 06/30/2021 6:31 AM
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
## 2021-06-30 NOTE — H&P (Signed)
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 is judged to be reasonable and necessary in order to provide the required intensity of service to ensure the patient's safety. The patient's presenting symptoms, physical exam findings, and initial radiographic and laboratory data in the context of their chronic comorbidities is felt to place them at high risk for further clinical deterioration. Furthermore, it is not anticipated that the patient will be medically stable for discharge from the hospital within 2 midnights of admission.   * I certify that at the point of admission it is my clinical judgment that the patient will require inpatient hospital care spanning beyond 2 midnights from the point of admission due to high intensity of service, high risk for further deterioration and high frequency of surveillance required.*       Date of Service 06/30/2021    [facility]   If 7PM-7AM, please contact night-coverage [email or web address] 06/30/2021, 6:59 PM

## 2021-07-01 ENCOUNTER — Inpatient Hospital Stay: Payer: Medicaid Other | Admitting: Radiology

## 2021-07-01 DIAGNOSIS — N186 End stage renal disease: Secondary | ICD-10-CM | POA: Diagnosis not present

## 2021-07-01 DIAGNOSIS — T83098A Other mechanical complication of other indwelling urethral catheter, initial encounter: Secondary | ICD-10-CM | POA: Diagnosis not present

## 2021-07-01 DIAGNOSIS — N3000 Acute cystitis without hematuria: Secondary | ICD-10-CM | POA: Diagnosis not present

## 2021-07-01 HISTORY — PX: IR NEPHROSTOMY EXCHANGE RIGHT: IMG6070

## 2021-07-01 LAB — BASIC METABOLIC PANEL
Anion gap: 13 (ref 5–15)
BUN: 73 mg/dL — ABNORMAL HIGH (ref 6–20)
CO2: 19 mmol/L — ABNORMAL LOW (ref 22–32)
Calcium: 7.8 mg/dL — ABNORMAL LOW (ref 8.9–10.3)
Chloride: 104 mmol/L (ref 98–111)
Creatinine, Ser: 8.02 mg/dL — ABNORMAL HIGH (ref 0.61–1.24)
GFR, Estimated: 7 mL/min — ABNORMAL LOW (ref >60)
Glucose, Bld: 86 mg/dL (ref 70–99)
Potassium: 4.9 mmol/L (ref 3.5–5.1)
Sodium: 136 mmol/L (ref 135–145)

## 2021-07-01 LAB — LIPID PANEL
Cholesterol: 74 mg/dL (ref 0–200)
HDL: 31 mg/dL — ABNORMAL LOW (ref >40)
LDL Cholesterol: 29 mg/dL (ref 0–99)
Total CHOL/HDL Ratio: 2.4 RATIO
Triglycerides: 70 mg/dL (ref <150)
VLDL: 14 mg/dL (ref 0–40)

## 2021-07-01 LAB — MRSA NEXT GEN BY PCR, NASAL: MRSA by PCR Next Gen: NOT DETECTED

## 2021-07-01 LAB — HEPATITIS B SURFACE ANTIBODY,QUALITATIVE: Hep B S Ab: NONREACTIVE

## 2021-07-01 LAB — CYTOLOGY - NON PAP

## 2021-07-01 LAB — URINE CULTURE

## 2021-07-01 LAB — GLUCOSE, CAPILLARY: Glucose-Capillary: 85 mg/dL (ref 70–99)

## 2021-07-01 LAB — HEPATITIS B SURFACE ANTIGEN: Hepatitis B Surface Ag: NONREACTIVE

## 2021-07-01 MED ORDER — HEPARIN SODIUM (PORCINE) 1000 UNIT/ML DIALYSIS: 1000.0000 [IU] | INTRAMUSCULAR | Status: DC | PRN

## 2021-07-01 MED ORDER — ALTEPLASE 2 MG IJ SOLR: 2.0000 mg | Freq: Once | INTRAMUSCULAR | Status: DC | PRN

## 2021-07-01 MED ORDER — SODIUM CHLORIDE 0.9 % IV SOLN: 100.0000 mL | INTRAVENOUS | Status: DC | PRN

## 2021-07-01 MED ORDER — LIDOCAINE HCL (PF) 1 % IJ SOLN: 5.0000 mL | INTRAMUSCULAR | Status: DC | PRN

## 2021-07-01 MED ORDER — FENTANYL CITRATE (PF) 100 MCG/2ML IJ SOLN
INTRAMUSCULAR | Status: DC | PRN
  Administered 2021-07-01 (×2): 50 ug via INTRAVENOUS

## 2021-07-01 MED ORDER — MIDAZOLAM HCL 2 MG/ML PO SYRP
4.0000 mg | ORAL_SOLUTION | Freq: Once | ORAL | Status: AC
  Administered 2021-07-01: 4 mg via ORAL
  Filled 2021-07-01: qty 2

## 2021-07-01 MED ORDER — SODIUM CHLORIDE 0.9 % IV SOLN: Freq: Once | INTRAVENOUS | Status: AC

## 2021-07-01 MED ORDER — LIDOCAINE-PRILOCAINE 2.5-2.5 % EX CREA
1.0000 "application " | TOPICAL_CREAM | CUTANEOUS | Status: DC | PRN
  Filled 2021-07-01: qty 5

## 2021-07-01 MED ORDER — HEPARIN SODIUM (PORCINE) 1000 UNIT/ML IJ SOLN
INTRAMUSCULAR | Status: AC
  Filled 2021-07-01: qty 10

## 2021-07-01 MED ORDER — MIDAZOLAM HCL 2 MG/2ML IJ SOLN
INTRAMUSCULAR | Status: DC | PRN
  Administered 2021-07-01: 1 mg via INTRAVENOUS

## 2021-07-01 MED ORDER — LIDOCAINE HCL 1 % IJ SOLN
INTRAMUSCULAR | Status: DC | PRN
  Administered 2021-07-01: 3 mL via INTRADERMAL

## 2021-07-01 MED ORDER — PENTAFLUOROPROP-TETRAFLUOROETH EX AERO: 1.0000 "application " | INHALATION_SPRAY | CUTANEOUS | Status: DC | PRN

## 2021-07-01 NOTE — Progress Notes (Signed)
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 Continue on IV cefepime. Urine cx is pending    ESRD: on MWF. Management as per nephro    Malfunction of nephrostomy tube: will possible go for nephrostomy tube exchange today as per IR   Tobacco use disorder: nicotine patch to prevent w/drawl. Smoking cessation counseling    HTN: pt is no longer taking coreg    ACD: likely secondary to ESRD. No need for a transfusion currently    Elevated troponin: trending down. Likely secondary to demand ischemia.    B/l Pleural effusions: s/p thoracentesis. Pleural fluid cx NGTD. Pathology is pending    Chronic combined CHF : echo 04/12/2021 showed EF <20%. W/o exacerbation. Volume management w/ HD    Fall: CT head shows no acute intracranial findings. Wheel chair bound as per pt     DVT prophylaxis: SCDs Code Status: full  Family Communication:  Disposition Plan: likely d/c back home   Level of care: Telemetry Medical  Status is: Inpatient Remains inpatient appropriate because: severity of illness    Consultants:  Nephro IR  Procedures:   Antimicrobials:    Subjective: Pt c/o fatigue   Objective: Vitals:   07/01/21 1315 07/01/21 1327 07/01/21 1330 07/01/21 1403  BP:  (!) 141/90  137/86 (!) 142/89  Pulse: 72  74   Resp: (!) 28  20 (!) 33  Temp:  98.1 F (36.7 C)    TempSrc:  Oral    SpO2: 96%  94% 92%  Weight:   50.1 kg   Height:        Intake/Output Summary (Last 24 hours) at 07/01/2021 1410 Last data filed at 07/01/2021 1318 Gross per 24 hour  Intake 340 ml  Output 2006 ml  Net -1666 ml   Filed Weights   06/30/21 1959 07/01/21 0952 07/01/21 1330  Weight: 52.3 kg 52.2 kg 50.1 kg    Examination:  General exam: Appears calm and comfortable  Respiratory system: Clear to auscultation. Respiratory effort normal. Cardiovascular system: S1 & S2 +. No rubs, gallops or clicks.  Gastrointestinal system: Abdomen is nondistended, soft and nontender. Normal bowel sounds heard. Central nervous system: Alert and awake. Moves all extremities Psychiatry: Judgement and insight appears at baseline. Flat mood and affect    Data Reviewed: I have personally reviewed following labs and imaging studies  CBC: Recent Labs  Lab 06/29/21 2329  WBC 5.7  NEUTROABS 3.9  HGB 9.8*  HCT 31.8*  MCV 100.6*  PLT 384   Basic Metabolic Panel: Recent Labs  Lab 06/29/21 2329 07/01/21 0615  NA 143 136  K 4.4 4.9  CL 108 104  CO2 24 19*  GLUCOSE 84 86  BUN 59* 73*  CREATININE 7.28* 8.02*  CALCIUM 8.1* 7.8*  GFR: Estimated Creatinine Clearance: 7.6 mL/min (A) (by C-G formula based on SCr of 8.02 mg/dL (H)). Liver Function Tests: Recent Labs  Lab 06/29/21 2329  AST 24  ALT 20  ALKPHOS 116  BILITOT 0.6  PROT 8.8*  ALBUMIN 2.9*   No results for input(s): LIPASE, AMYLASE in the last 168 hours. No results for input(s): AMMONIA in the last 168 hours. Coagulation Profile: Recent Labs  Lab 06/30/21 0907  INR 1.3*   Cardiac Enzymes: No results for input(s): CKTOTAL, CKMB, CKMBINDEX, TROPONINI in the last 168 hours. BNP (last 3 results) No results for input(s): PROBNP in the last 8760 hours. HbA1C: Recent Labs    06/30/21 0907  HGBA1C 5.0    CBG: Recent Labs  Lab 07/01/21 0743  GLUCAP 85   Lipid Profile: Recent Labs    07/01/21 0615  CHOL 74  HDL 31*  LDLCALC 29  TRIG 70  CHOLHDL 2.4   Thyroid Function Tests: Recent Labs    06/29/21 2329  TSH 10.019*  FREET4 0.90   Anemia Panel: No results for input(s): VITAMINB12, FOLATE, FERRITIN, TIBC, IRON, RETICCTPCT in the last 72 hours. Sepsis Labs: Recent Labs  Lab 06/29/21 2329 06/30/21 0127 06/30/21 0907 06/30/21 1320  PROCALCITON 1.05  --   --   --   LATICACIDVEN 2.0* 2.0* 1.9 2.3*    Recent Results (from the past 240 hour(s))  Culture, blood (routine x 2)     Status: None (Preliminary result)   Collection Time: 06/29/21 11:29 PM   Specimen: BLOOD  Result Value Ref Range Status   Specimen Description BLOOD RIGHT FOREARM  Final   Special Requests   Final    BOTTLES DRAWN AEROBIC AND ANAEROBIC Blood Culture adequate volume   Culture   Final    NO GROWTH 2 DAYS Performed at [facility], [address], [place], [place] [address]    Report Status PENDING  Incomplete  Culture, blood (routine x 2)     Status: None (Preliminary result)   Collection Time: 06/29/21 11:29 PM   Specimen: BLOOD  Result Value Ref Range Status   Specimen Description BLOOD RIGHT ARM  Final   Special Requests IN PEDIATRIC BOTTLE Blood Culture adequate volume  Final   Culture   Final    NO GROWTH 2 DAYS Performed at [facility], [address], [place], [place] [address]    Report Status PENDING  Incomplete  Urine Culture     Status: Abnormal   Collection Time: 06/29/21 11:49 PM   Specimen: Urine, Random  Result Value Ref Range Status   Specimen Description   Final    URINE, RANDOM Performed at [facility], [address] [place], [place] [address]    Special Requests   Final    NONE Performed at [facility], [address], [place], [place] [address]    Culture MULTIPLE SPECIES PRESENT, SUGGEST RECOLLECTION (A)  Final    Report Status 07/01/2021 FINAL  Final  Resp Panel by RT-PCR (Flu A&B, Covid)     Status: None   Collection Time: 06/29/21 11:49 PM   Specimen: Nasopharyngeal(NP) swabs in vial transport medium  Result Value Ref Range Status   SARS Coronavirus 2 by RT PCR NEGATIVE NEGATIVE Final    Comment: (NOTE) SARS-CoV-2 target nucleic acids are NOT DETECTED.  The SARS-CoV-2 RNA is generally detectable in upper respiratory specimens during the acute phase of infection. The lowest concentration of SARS-CoV-2 viral copies this assay can detect is  138 copies/mL. A negative result does not preclude SARS-Cov-2 infection and should not be used as the sole basis for treatment or other patient management decisions. A negative result may occur with  improper specimen collection/handling, submission of specimen other than nasopharyngeal swab, presence of viral mutation(s) within the areas targeted by this assay, and inadequate number of viral copies(<138 copies/mL). A negative result must be combined with clinical observations, patient history, and epidemiological information. The expected result is Negative.  Fact Sheet for Patients:  EntrepreneurPulse.com.au  Fact Sheet for Healthcare Providers:  IncredibleEmployment.be  This test is no t yet approved or cleared by the [place] FDA and  has been authorized for detection and/or diagnosis of SARS-CoV-2 by FDA under an Emergency Use Authorization (EUA). This EUA will remain  in effect (meaning this test can be used) for the duration of the COVID-19 declaration under Section 564(b)(1) of the Act, 21 U.S.C.section 360bbb-3(b)(1), unless the authorization is terminated  or revoked sooner.       Influenza A by PCR NEGATIVE NEGATIVE Final   Influenza B by PCR NEGATIVE NEGATIVE Final    Comment: (NOTE) The Xpert Xpress SARS-CoV-2/FLU/RSV plus assay is intended as an aid in the diagnosis of influenza from  Nasopharyngeal swab specimens and should not be used as a sole basis for treatment. Nasal washings and aspirates are unacceptable for Xpert Xpress SARS-CoV-2/FLU/RSV testing.  Fact Sheet for Patients: EntrepreneurPulse.com.au  Fact Sheet for Healthcare Providers: IncredibleEmployment.be  This test is not yet approved or cleared by the [place] FDA and has been authorized for detection and/or diagnosis of SARS-CoV-2 by FDA under an Emergency Use Authorization (EUA). This EUA will remain in effect (meaning this test can be used) for the duration of the COVID-19 declaration under Section 564(b)(1) of the Act, 21 U.S.C. section 360bbb-3(b)(1), unless the authorization is terminated or revoked.  Performed at [facility], [address] [place], [place] [address]   Body fluid culture w Gram Stain     Status: None (Preliminary result)   Collection Time: 06/30/21 10:46 AM   Specimen: PATH Cytology Pleural fluid  Result Value Ref Range Status   Specimen Description   Final    PLEURAL Performed at [facility], [address] [place], [place] [address]    Special Requests   Final    NONE Performed at [facility], [address] [place], [place] [address]    Gram Stain   Final    NO SQUAMOUS EPITHELIAL CELLS SEEN FEW WBC SEEN NO ORGANISMS SEEN Performed at [facility], [place] [address], [place], [place] [address]    Culture PENDING  Incomplete   Report Status PENDING  Incomplete  MRSA Next Gen by PCR, Nasal     Status: None   Collection Time: 07/01/21  4:40 AM   Specimen: Nasal Mucosa; Nasal Swab  Result Value Ref Range Status   MRSA by PCR Next Gen NOT DETECTED NOT DETECTED Final    Comment: (NOTE) The GeneXpert MRSA Assay (FDA approved for NASAL specimens only), is one component of a comprehensive MRSA colonization surveillance program. It is not intended to diagnose MRSA infection nor to  guide or monitor treatment for MRSA infections. Test performance is not FDA approved in patients less than 25 years old. Performed at [facility], [address], [place], [place] [address]          Radiology Studies: CT HEAD WO CONTRAST (5MM)  Result Date: 06/30/2021 CLINICAL DATA:  Minor head trauma, history of end-stage renal disease on dialysis, missed dialysis yesterday due to sliding out of chair onto floor, too weak to get up. History cardiomyopathy, hypertension, pulmonary hypertension, stroke, smoker EXAM: CT HEAD WITHOUT CONTRAST TECHNIQUE: Contiguous axial images were obtained from the base of the skull through the vertex without intravenous contrast. RADIATION DOSE REDUCTION: This exam was performed according to the departmental dose-optimization program which includes automated exposure control, adjustment of the mA and/or kV according to patient size and/or use of iterative reconstruction technique. 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 Generalized atrophy. Normal ventricular morphology. No midline shift or mass effect. Large area of encephalomalacia at the LEFT occipital and posterior parietal lobes consistent with remote infarct. Mild small vessel chronic ischemic changes of deep cerebral white matter. No intracranial hemorrhage, mass lesion, or evidence of acute infarction. No extra-axial fluid collections. Vascular: No hyperdense vessels. Skull: Prior posterior LEFT parietooccipital craniotomy. No acute osseous findings. Sinuses/Orbits: Clear Other: N/A IMPRESSION: Atrophy with small vessel chronic ischemic changes of deep cerebral white matter. Prior aneurysm clipping posterior LEFT circulation. Large area of encephalomalacia at the LEFT occipital and posterior parietal lobes consistent with remote infarct. No acute intracranial abnormalities. Electronically Signed   By: Lavonia Dana M.D.   On: 06/30/2021 13:10   CT Chest W  Contrast  Result Date: 06/30/2021 CLINICAL DATA:  Pleural effusion, malignancy suspected. Fatigue. End-stage renal disease. EXAM: CT CHEST WITH CONTRAST TECHNIQUE: Multidetector CT imaging of the chest was performed during intravenous contrast administration. RADIATION DOSE REDUCTION: This exam was performed according to the departmental dose-optimization program which includes automated exposure control, adjustment of the mA and/or kV according to patient size and/or use of iterative reconstruction technique. CONTRAST:  60mL OMNIPAQUE IOHEXOL 300 MG/ML  SOLN COMPARISON:  None. FINDINGS: Cardiovascular: No significant coronary artery calcification. Mild global cardiomegaly. No pericardial effusion. Central pulmonary arteries are of normal caliber. Mild atherosclerotic calcification within the thoracic aorta. No aortic aneurysm. Bovine arch anatomy. Arch vasculature is widely patent proximally. Stent graft is seen within the right basilic and axillary veins with extensive intimal hyperplasia within the stent lumen. The vessel, however, is still patent. Right internal jugular hemodialysis catheter is seen with its tips within the superior vena cava and right atrium. Mediastinum/Nodes: Thyroid unremarkable. No pathologic thoracic adenopathy. The esophagus is unremarkable. Small hiatal hernia. Lungs/Pleura: Multiloculated right pleural effusion is seen with small apicolateral and moderate basilar components. Moderate left pleural effusion is present dependently. There is compressive atelectasis of the lower lobes bilaterally. Superimposed consolidation within the left lower lobe noted peripherally posterior like ting changes of acute infection or rounded atelectasis. Mild biapical paraseptal emphysema. No central obstructing lesion. Upper Abdomen: Right percutaneous nephrostomy is partially visualized and appears to have been withdrawn with its cope loop now external to the lower pole the right kidney. Cholelithiasis  noted. Musculoskeletal: No acute bone abnormality. No lytic or blastic bone lesion. IMPRESSION: Multiloculated right and dependent left moderate pleural effusions with associated bibasilar compressive atelectasis. Superimposed consolidation within the left lower lobe may reflect changes of acute infection or, more likely, rounded atelectasis. Correlation with the patient's clinical examination would be helpful in differentiating these entities. Mild global cardiomegaly. Right percutaneous nephrostomy is partially visualized but appears withdrawn from the right renal collecting system with its cope loop positioned adjacent to the lower pole the right kidney. Revision or removal is recommended. Cholelithiasis Aortic Atherosclerosis (ICD10-I70.0) and Emphysema (ICD10-J43.9). Electronically Signed   By: Fidela Salisbury  M.D.   On: 06/30/2021 01:43   DG Chest Port 1 View  Result Date: 06/30/2021 CLINICAL DATA:  Status post left-sided thoracentesis EXAM: PORTABLE CHEST 1 VIEW COMPARISON:  06/29/2021 FINDINGS: Right IJ hemodialysis catheter remains in place. Stable cardiomegaly. Moderate bilateral pleural effusions which may be minimally decreased on the left. Persistent left basilar opacity. No pneumothorax. Right subclavian vascular stent noted. IMPRESSION: Moderate bilateral pleural effusions which may be minimally decreased on the left. No pneumothorax. Electronically Signed   By: Davina Poke D.O.   On: 06/30/2021 11:28   DG Chest Port 1 View  Result Date: 06/29/2021 CLINICAL DATA:  Weakness and fatigue. EXAM: PORTABLE CHEST 1 VIEW COMPARISON:  Portable chest 04/16/2021, 04/13/2021. FINDINGS: A dialysis catheter via right IJ approach is again noted terminating in the upper right atrium. There is a vascular stent in the right axilla. Interval extubation and removal NGT. The heart is moderately enlarged and slight central vascular fullness is seen without overt edema. There are moderate-sized layering pleural  effusions. The effusion on the right has increased in the interval, on left has decreased in the interval. There is a focal pleural-based opacity in the peripheral left mid lung just above the pleural fluid, showing parenchymal stranding medial to it. Hazy atelectasis versus pneumonitis is noted in the lower lung fields alongside the effusions. The upper lung fields are clear. Thoracic cage is grossly intact. IMPRESSION: 1. Moderate-sized layering pleural effusions. 2. Cardiomegaly with mild central vascular prominence without visible edema. 3. 5.6 x 3 cm pleural-based opacity lateral peripheral left mid field with adjacent stranding. This does appear to have been present on 04/13/2021 but was obscured by the larger volume of left pleural fluid on the 04/16/2021 exam. Differential diagnosis is rounded atelectasis, loculated fluid, or a pleural-parenchymal mass lesion. CT is recommended, preferably with contrast if the patient can tolerate contrast. 4. Hazy atelectasis or infiltrates alongside the effusions in the lower lung fields. Electronically Signed   By: Telford Nab M.D.   On: 06/29/2021 23:37   US THORACENTESIS ASP PLEURAL SPACE W/IMG GUIDE  Result Date: 06/30/2021 INDICATION: History of ESRD on HD with recurrent left pleural effusion. Request for diagnostic therapeutic thoracentesis. EXAM: ULTRASOUND GUIDED DIAGNOSTIC AND THERAPEUTIC LEFT THORACENTESIS MEDICATIONS: None. COMPLICATIONS: 10 mL 1 % lidocaine PROCEDURE: An ultrasound guided thoracentesis was thoroughly discussed with the patient and questions answered. The benefits, risks, alternatives and complications were also discussed. The patient understands and wishes to proceed with the procedure. Written consent was obtained. Ultrasound was performed to localize and mark an adequate pocket of fluid in the left chest. The area was then prepped and draped in the normal sterile fashion. 1% Lidocaine was used for local anesthesia. Under ultrasound  guidance a 6 Fr Safe-T-Centesis catheter was introduced. Thoracentesis was performed. The catheter was removed and a dressing applied. FINDINGS: A total of approximately 500 cc of serosanguineous fluid was removed. Samples were sent to the laboratory as requested by the clinical team. IMPRESSION: Successful ultrasound guided left thoracentesis yielding 500 cc of pleural fluid. Read by: Narda Rutherford, AGNP-BC Electronically Signed   By: Albin Felling M.D.   On: 06/30/2021 10:45        Scheduled Meds:  (feeding supplement) PROSource Plus  30 mL Oral BID BM   Chlorhexidine Gluconate Cloth  6 each Topical Daily   feeding supplement  1 Container Oral BID BM   heparin sodium (porcine)       multivitamin  1 tablet Oral QHS   nicotine  21 mg Transdermal Daily   Continuous Infusions:  ceFEPime (MAXIPIME) IV 1 g (06/30/21 1625)     LOS: 1 day    Time spent: 30 mins     Wyvonnia Dusky, MD [facility] Pager [phone]  If 7PM-7AM, please contact night-coverage 07/01/2021, 2:10 PM

## 2021-07-01 NOTE — Progress Notes (Signed)
[facility]  ROUNDING NOTE   Subjective:   Larry Morrison is a 51 year old American male with a past medical history including anemia, hypertension, pleural effusions, pneumonia, stroke, and end-stage renal disease on hemodialysis.  Patient presents to the emergency department after being found at home on the floor after sliding out of his wheelchair.  He has been admitted for UTI (urinary tract infection) [N39.0] ESRD (end stage renal disease) on dialysis ([place]) [N18.6, Z99.2] Malfunction of nephrostomy tube ([place]) [T83.098A] Generalized weakness [R53.1] Urinary tract infection without hematuria, site unspecified [N39.0] Other fatigue [R53.83] Sepsis, due to unspecified organism, unspecified whether acute organ dysfunction present [facility]) [A41.9]   Patient is known to our practice and receives outpatient dialysis at [facility] on a MWF schedule, supervised by Dr. Juleen China.    Patient seen and evaluated during dialysis   HEMODIALYSIS FLOWSHEET:  Blood Flow Rate (mL/min): 400 mL/min Arterial Pressure (mmHg): -200 mmHg Venous Pressure (mmHg): 200 mmHg Transmembrane Pressure (mmHg): 30 mmHg Ultrafiltration Rate (mL/min): 70 mL/min Dialysate Flow Rate (mL/min): 500 ml/min Conductivity: Machine : 13.6 Conductivity: Machine : 13.6  Currently NPO for procedure No complaints at this time   Objective:  Vital signs in last 24 hours:  Temp:  [98 F (36.7 C)-99.2 F (37.3 C)] 98.1 F (36.7 C) (02/10 1327) Pulse Rate:  [72-95] 74 (02/10 1330) Resp:  [16-42] 33 (02/10 1403) BP: (137-157)/(86-110) 142/89 (02/10 1403) SpO2:  [92 %-100 %] 92 % (02/10 1403) Weight:  [50.1 kg-52.3 kg] 50.1 kg (02/10 1330)  Weight change: 0 kg Filed Weights   06/30/21 1959 07/01/21 0952 07/01/21 1330  Weight: 52.3 kg 52.2 kg 50.1 kg    Intake/Output: I/O last 3 completed shifts: In: 340 [P.O.:240; IV Piggyback:100] Out: 140 [Urine:140]   Intake/Output this shift:  Total I/O In: -   Out: 2006 [Other:2006]  Physical Exam: General: NAD  Head: Normocephalic, atraumatic. Moist oral mucosal membranes  Eyes: Anicteric  Lungs:  Clear to auscultation, normal effort  Heart: Regular rate and rhythm  Abdomen:  Soft, nontender, Rt ileostomy tube  Extremities:  trace peripheral edema.  Neurologic: Nonfocal, moving all four extremities  Skin: No lesions  Access: Lt IJ permcath    Basic Metabolic Panel: Recent Labs  Lab 06/29/21 2329 07/01/21 0615  NA 143 136  K 4.4 4.9  CL 108 104  CO2 24 19*  GLUCOSE 84 86  BUN 59* 73*  CREATININE 7.28* 8.02*  CALCIUM 8.1* 7.8*     Liver Function Tests: Recent Labs  Lab 06/29/21 2329  AST 24  ALT 20  ALKPHOS 116  BILITOT 0.6  PROT 8.8*  ALBUMIN 2.9*    No results for input(s): LIPASE, AMYLASE in the last 168 hours. No results for input(s): AMMONIA in the last 168 hours.  CBC: Recent Labs  Lab 06/29/21 2329  WBC 5.7  NEUTROABS 3.9  HGB 9.8*  HCT 31.8*  MCV 100.6*  PLT 247     Cardiac Enzymes: No results for input(s): CKTOTAL, CKMB, CKMBINDEX, TROPONINI in the last 168 hours.  BNP: Invalid input(s): POCBNP  CBG: Recent Labs  Lab 07/01/21 4098  JXBJYN 82    Microbiology: Results for orders placed or performed during the hospital encounter of 06/29/21  Culture, blood (routine x 2)     Status: None (Preliminary result)   Collection Time: 06/29/21 11:29 PM   Specimen: BLOOD  Result Value Ref Range Status   Specimen Description BLOOD RIGHT FOREARM  Final   Special Requests  Final    BOTTLES DRAWN AEROBIC AND ANAEROBIC Blood Culture adequate volume   Culture   Final    NO GROWTH 2 DAYS Performed at [facility], [address] [place], [place] [address]    Report Status PENDING  Incomplete  Culture, blood (routine x 2)     Status: None (Preliminary result)   Collection Time: 06/29/21 11:29 PM   Specimen: BLOOD  Result Value Ref Range Status   Specimen Description BLOOD RIGHT ARM   Final   Special Requests IN PEDIATRIC BOTTLE Blood Culture adequate volume  Final   Culture   Final    NO GROWTH 2 DAYS Performed at [facility], [address] [place], [place] [address]    Report Status PENDING  Incomplete  Urine Culture     Status: Abnormal   Collection Time: 06/29/21 11:49 PM   Specimen: Urine, Random  Result Value Ref Range Status   Specimen Description   Final    URINE, RANDOM Performed at [facility], [address], [place], [place] [address]    Special Requests   Final    NONE Performed at [facility], [address] [place], [place] [address]    Culture MULTIPLE SPECIES PRESENT, SUGGEST RECOLLECTION (A)  Final   Report Status 07/01/2021 FINAL  Final  Resp Panel by RT-PCR (Flu A&B, Covid)     Status: None   Collection Time: 06/29/21 11:49 PM   Specimen: Nasopharyngeal(NP) swabs in vial transport medium  Result Value Ref Range Status   SARS Coronavirus 2 by RT PCR NEGATIVE NEGATIVE Final    Comment: (NOTE) SARS-CoV-2 target nucleic acids are NOT DETECTED.  The SARS-CoV-2 RNA is generally detectable in upper respiratory specimens during the acute phase of infection. The lowest concentration of SARS-CoV-2 viral copies this assay can detect is 138 copies/mL. A negative result does not preclude SARS-Cov-2 infection and should not be used as the sole basis for treatment or other patient management decisions. A negative result may occur with  improper specimen collection/handling, submission of specimen other than nasopharyngeal swab, presence of viral mutation(s) within the areas targeted by this assay, and inadequate number of viral copies(<138 copies/mL). A negative result must be combined with clinical observations, patient history, and epidemiological information. The expected result is Negative.  Fact Sheet for Patients:  EntrepreneurPulse.com.au  Fact Sheet for Healthcare Providers:   IncredibleEmployment.be  This test is no t yet approved or cleared by the [place] FDA and  has been authorized for detection and/or diagnosis of SARS-CoV-2 by FDA under an Emergency Use Authorization (EUA). This EUA will remain  in effect (meaning this test can be used) for the duration of the COVID-19 declaration under Section 564(b)(1) of the Act, 21 U.S.C.section 360bbb-3(b)(1), unless the authorization is terminated  or revoked sooner.       Influenza A by PCR NEGATIVE NEGATIVE Final   Influenza B by PCR NEGATIVE NEGATIVE Final    Comment: (NOTE) The Xpert Xpress SARS-CoV-2/FLU/RSV plus assay is intended as an aid in the diagnosis of influenza from Nasopharyngeal swab specimens and should not be used as a sole basis for treatment. Nasal washings and aspirates are unacceptable for Xpert Xpress SARS-CoV-2/FLU/RSV testing.  Fact Sheet for Patients: EntrepreneurPulse.com.au  Fact Sheet for Healthcare Providers: IncredibleEmployment.be  This test is not yet approved or cleared by the [place] FDA and has been authorized for detection and/or diagnosis of SARS-CoV-2 by FDA under an Emergency Use Authorization (EUA). This EUA  will remain in effect (meaning this test can be used) for the duration of the COVID-19 declaration under Section 564(b)(1) of the Act, 21 U.S.C. section 360bbb-3(b)(1), unless the authorization is terminated or revoked.  Performed at [facility], [address] [place], [place] [address]   Body fluid culture w Gram Stain     Status: None (Preliminary result)   Collection Time: 06/30/21 10:46 AM   Specimen: PATH Cytology Pleural fluid  Result Value Ref Range Status   Specimen Description   Final    PLEURAL Performed at [facility], [address], [place], [place] [address]    Special Requests   Final    NONE Performed at [facility], [address] [place], [place] [address]    Gram Stain   Final    NO SQUAMOUS EPITHELIAL CELLS SEEN FEW WBC SEEN NO ORGANISMS SEEN Performed at [facility], [place] [address], [place], [place] [address]    Culture PENDING  Incomplete   Report Status PENDING  Incomplete  MRSA Next Gen by PCR, Nasal     Status: None   Collection Time: 07/01/21  4:40 AM   Specimen: Nasal Mucosa; Nasal Swab  Result Value Ref Range Status   MRSA by PCR Next Gen NOT DETECTED NOT DETECTED Final    Comment: (NOTE) The GeneXpert MRSA Assay (FDA approved for NASAL specimens only), is one component of a comprehensive MRSA colonization surveillance program. It is not intended to diagnose MRSA infection nor to guide or monitor treatment for MRSA infections. Test performance is not FDA approved in patients less than 47 years old. Performed at [facility], [address] [place], [place] [address]     Coagulation Studies: Recent Labs    06/30/21 5597  LABPROT 16.0*  INR 1.3*     Urinalysis: Recent Labs    06/29/21 2349  COLORURINE YELLOW*  LABSPEC 1.014  PHURINE 8.0  GLUCOSEU NEGATIVE  HGBUR SMALL*  BILIRUBINUR NEGATIVE  KETONESUR NEGATIVE  PROTEINUR >=300*  NITRITE NEGATIVE  LEUKOCYTESUR LARGE*       Imaging: CT HEAD WO CONTRAST (5MM)  Result Date: 06/30/2021 CLINICAL DATA:  Minor head trauma, history of end-stage renal disease on dialysis, missed dialysis yesterday due to sliding out of chair onto floor, too weak to get up. History cardiomyopathy, hypertension, pulmonary hypertension, stroke, smoker EXAM: CT HEAD WITHOUT CONTRAST TECHNIQUE: Contiguous axial images were obtained from the base of the skull through the vertex without intravenous contrast. RADIATION DOSE REDUCTION: This exam was performed according to the departmental dose-optimization program which includes automated exposure control, adjustment of the mA and/or kV according to patient size and/or use of iterative  reconstruction technique. 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 Generalized atrophy. Normal ventricular morphology. No midline shift or mass effect. Large area of encephalomalacia at the LEFT occipital and posterior parietal lobes consistent with remote infarct. Mild small vessel chronic ischemic changes of deep cerebral white matter. No intracranial hemorrhage, mass lesion, or evidence of acute infarction. No extra-axial fluid collections. Vascular: No hyperdense vessels. Skull: Prior posterior LEFT parietooccipital craniotomy. No acute osseous findings. Sinuses/Orbits: Clear Other: N/A IMPRESSION: Atrophy with small vessel chronic ischemic changes of deep cerebral white matter. Prior aneurysm clipping posterior LEFT circulation. Large area of encephalomalacia at the LEFT occipital and posterior parietal lobes consistent with remote infarct. No acute intracranial abnormalities. Electronically Signed   By: Lavonia Dana M.D.   On: 06/30/2021 13:10  CT Chest W Contrast  Result Date: 06/30/2021 CLINICAL DATA:  Pleural effusion, malignancy suspected. Fatigue. End-stage renal disease. EXAM: CT CHEST WITH CONTRAST TECHNIQUE: Multidetector CT imaging of the chest was performed during intravenous contrast administration. RADIATION DOSE REDUCTION: This exam was performed according to the departmental dose-optimization program which includes automated exposure control, adjustment of the mA and/or kV according to patient size and/or use of iterative reconstruction technique. CONTRAST:  48mL OMNIPAQUE IOHEXOL 300 MG/ML  SOLN COMPARISON:  None. FINDINGS: Cardiovascular: No significant coronary artery calcification. Mild global cardiomegaly. No pericardial effusion. Central pulmonary arteries are of normal caliber. Mild atherosclerotic calcification within the thoracic aorta. No aortic aneurysm. Bovine arch anatomy. Arch vasculature is widely patent proximally. Stent graft is seen  within the right basilic and axillary veins with extensive intimal hyperplasia within the stent lumen. The vessel, however, is still patent. Right internal jugular hemodialysis catheter is seen with its tips within the superior vena cava and right atrium. Mediastinum/Nodes: Thyroid unremarkable. No pathologic thoracic adenopathy. The esophagus is unremarkable. Small hiatal hernia. Lungs/Pleura: Multiloculated right pleural effusion is seen with small apicolateral and moderate basilar components. Moderate left pleural effusion is present dependently. There is compressive atelectasis of the lower lobes bilaterally. Superimposed consolidation within the left lower lobe noted peripherally posterior like ting changes of acute infection or rounded atelectasis. Mild biapical paraseptal emphysema. No central obstructing lesion. Upper Abdomen: Right percutaneous nephrostomy is partially visualized and appears to have been withdrawn with its cope loop now external to the lower pole the right kidney. Cholelithiasis noted. Musculoskeletal: No acute bone abnormality. No lytic or blastic bone lesion. IMPRESSION: Multiloculated right and dependent left moderate pleural effusions with associated bibasilar compressive atelectasis. Superimposed consolidation within the left lower lobe may reflect changes of acute infection or, more likely, rounded atelectasis. Correlation with the patient's clinical examination would be helpful in differentiating these entities. Mild global cardiomegaly. Right percutaneous nephrostomy is partially visualized but appears withdrawn from the right renal collecting system with its cope loop positioned adjacent to the lower pole the right kidney. Revision or removal is recommended. Cholelithiasis Aortic Atherosclerosis (ICD10-I70.0) and Emphysema (ICD10-J43.9). Electronically Signed   By: Fidela Salisbury M.D.   On: 06/30/2021 01:43   DG Chest Port 1 View  Result Date: 06/30/2021 CLINICAL DATA:  Status  post left-sided thoracentesis EXAM: PORTABLE CHEST 1 VIEW COMPARISON:  06/29/2021 FINDINGS: Right IJ hemodialysis catheter remains in place. Stable cardiomegaly. Moderate bilateral pleural effusions which may be minimally decreased on the left. Persistent left basilar opacity. No pneumothorax. Right subclavian vascular stent noted. IMPRESSION: Moderate bilateral pleural effusions which may be minimally decreased on the left. No pneumothorax. Electronically Signed   By: Davina Poke D.O.   On: 06/30/2021 11:28   DG Chest Port 1 View  Result Date: 06/29/2021 CLINICAL DATA:  Weakness and fatigue. EXAM: PORTABLE CHEST 1 VIEW COMPARISON:  Portable chest 04/16/2021, 04/13/2021. FINDINGS: A dialysis catheter via right IJ approach is again noted terminating in the upper right atrium. There is a vascular stent in the right axilla. Interval extubation and removal NGT. The heart is moderately enlarged and slight central vascular fullness is seen without overt edema. There are moderate-sized layering pleural effusions. The effusion on the right has increased in the interval, on left has decreased in the interval. There is a focal pleural-based opacity in the peripheral left mid lung just above the pleural fluid, showing parenchymal stranding medial to it. Hazy atelectasis versus pneumonitis is noted in the lower lung fields alongside  the effusions. The upper lung fields are clear. Thoracic cage is grossly intact. IMPRESSION: 1. Moderate-sized layering pleural effusions. 2. Cardiomegaly with mild central vascular prominence without visible edema. 3. 5.6 x 3 cm pleural-based opacity lateral peripheral left mid field with adjacent stranding. This does appear to have been present on 04/13/2021 but was obscured by the larger volume of left pleural fluid on the 04/16/2021 exam. Differential diagnosis is rounded atelectasis, loculated fluid, or a pleural-parenchymal mass lesion. CT is recommended, preferably with contrast if  the patient can tolerate contrast. 4. Hazy atelectasis or infiltrates alongside the effusions in the lower lung fields. Electronically Signed   By: Telford Nab M.D.   On: 06/29/2021 23:37   US THORACENTESIS ASP PLEURAL SPACE W/IMG GUIDE  Result Date: 06/30/2021 INDICATION: History of ESRD on HD with recurrent left pleural effusion. Request for diagnostic therapeutic thoracentesis. EXAM: ULTRASOUND GUIDED DIAGNOSTIC AND THERAPEUTIC LEFT THORACENTESIS MEDICATIONS: None. COMPLICATIONS: 10 mL 1 % lidocaine PROCEDURE: An ultrasound guided thoracentesis was thoroughly discussed with the patient and questions answered. The benefits, risks, alternatives and complications were also discussed. The patient understands and wishes to proceed with the procedure. Written consent was obtained. Ultrasound was performed to localize and mark an adequate pocket of fluid in the left chest. The area was then prepped and draped in the normal sterile fashion. 1% Lidocaine was used for local anesthesia. Under ultrasound guidance a 6 Fr Safe-T-Centesis catheter was introduced. Thoracentesis was performed. The catheter was removed and a dressing applied. FINDINGS: A total of approximately 500 cc of serosanguineous fluid was removed. Samples were sent to the laboratory as requested by the clinical team. IMPRESSION: Successful ultrasound guided left thoracentesis yielding 500 cc of pleural fluid. Read by: Narda Rutherford, AGNP-BC Electronically Signed   By: Albin Felling M.D.   On: 06/30/2021 10:45     Medications:    ceFEPime (MAXIPIME) IV 1 g (06/30/21 1625)    (feeding supplement) PROSource Plus  30 mL Oral BID BM   Chlorhexidine Gluconate Cloth  6 each Topical Daily   feeding supplement  1 Container Oral BID BM   heparin sodium (porcine)       multivitamin  1 tablet Oral QHS   nicotine  21 mg Transdermal Daily   acetaminophen, albuterol, dextromethorphan-guaiFENesin, hydrALAZINE  Assessment/ Plan:  Larry Morrison is a  51 y.o.  male a past medical history including anemia, hypertension, pleural effusions, pneumonia, stroke, and end-stage renal disease on hemodialysis.  Patient presents to the emergency department after being found at home on the floor after sliding out of his wheelchair.  He has been admitted for UTI (urinary tract infection) [N39.0] ESRD (end stage renal disease) on dialysis ([place]) [N18.6, Z99.2] Malfunction of nephrostomy tube ([place]) [T83.098A] Generalized weakness [R53.1] Urinary tract infection without hematuria, site unspecified [N39.0] Other fatigue [R53.83] Sepsis, due to unspecified organism, unspecified whether acute organ dysfunction present [facility]) [A41.9]  [facility] MWF [facility] Left IJ permcath 49kg  End-stage renal disease on hemodialysis.  Will maintain outpatient schedule during this admission.  Receiving dialysis with UF goal 2L as tolerated. Next treatment scheduled for Saturday.  2. Anemia of chronic kidney disease Macrocytic Macera prescribed outpatient Hemoglobin 9.8.  3. Secondary Hyperparathyroidism: with outpatient labs: phosphorus 6.2, calcium 8.5 on 04/27/21.   Lab Results  Component Value Date   CALCIUM 7.8 (L) 07/01/2021   CAION 0.76 (LL) 08/07/2019   PHOS 4.3 04/18/2021  Calcium and phosphorus within acceptable range. We will continue to monitor bone  minerals during this admission    LOS: 1 [place] 2/10/20232:11 PM

## 2021-07-02 DIAGNOSIS — J90 Pleural effusion, not elsewhere classified: Secondary | ICD-10-CM

## 2021-07-02 LAB — CBC
HCT: 27.3 % — ABNORMAL LOW (ref 39.0–52.0)
Hemoglobin: 8.8 g/dL — ABNORMAL LOW (ref 13.0–17.0)
MCH: 31.1 pg (ref 26.0–34.0)
MCHC: 32.2 g/dL (ref 30.0–36.0)
MCV: 96.5 fL (ref 80.0–100.0)
Platelets: 198 10*3/uL (ref 150–400)
RBC: 2.83 MIL/uL — ABNORMAL LOW (ref 4.22–5.81)
RDW: 15.3 % (ref 11.5–15.5)
WBC: 6.9 10*3/uL (ref 4.0–10.5)
nRBC: 0 % (ref 0.0–0.2)

## 2021-07-02 LAB — BASIC METABOLIC PANEL
Anion gap: 10 (ref 5–15)
BUN: 43 mg/dL — ABNORMAL HIGH (ref 6–20)
CO2: 24 mmol/L (ref 22–32)
Calcium: 7.5 mg/dL — ABNORMAL LOW (ref 8.9–10.3)
Chloride: 99 mmol/L (ref 98–111)
Creatinine, Ser: 5.58 mg/dL — ABNORMAL HIGH (ref 0.61–1.24)
GFR, Estimated: 12 mL/min — ABNORMAL LOW (ref >60)
Glucose, Bld: 91 mg/dL (ref 70–99)
Potassium: 3.8 mmol/L (ref 3.5–5.1)
Sodium: 133 mmol/L — ABNORMAL LOW (ref 135–145)

## 2021-07-02 LAB — GLUCOSE, CAPILLARY: Glucose-Capillary: 89 mg/dL (ref 70–99)

## 2021-07-02 LAB — HEPATITIS B SURFACE ANTIBODY, QUANTITATIVE: Hep B S AB Quant (Post): <3.1 m[IU]/mL — ABNORMAL LOW (ref >9.9)

## 2021-07-02 MED ORDER — CEFDINIR 300 MG PO CAPS: 300.0000 mg | ORAL_CAPSULE | Freq: Two times a day (BID) | ORAL | 0 refills | Status: AC

## 2021-07-02 NOTE — Discharge Summary (Signed)
Physician Discharge Summary  Larry Morrison EEF:[id number] DOB: [birth year] DOA: 06/29/2021  PCP: [facility], [place] date: 06/29/2021 Discharge date: 07/02/2021  Admitted From: home  Disposition:  home   Recommendations for Outpatient Follow-up:  Follow up with PCP in 1-2 weeks F/u w/ nephro in 1-2 weeks   Home Health: no  Equipment/Devices:  Discharge Condition: stable  CODE STATUS: full  Diet recommendation: Heart Healthy / renal diet   Brief/Interim Summary: HPI was taken from Dr. Blaine Hamper: Larry Morrison is a 51 y.o. male with medical history significant of ESRD-HD (MWF), CHF with EF< 20%, HTN, stroke, wheelchair dependent, anemia, cardiac arrest, cognitive retardation, tobacco abuse, pulmonary hypertension, pleural effusion, s/p right nephrostomy, who presents with generalized weakness.   Patient missed his dialysis on Wednesday.  He has generalized weakness. He has shortness breath and dry cough.  Denies chest pain.  No fever or chills.  No nausea vomiting, diarrhea or abdominal pain.  Denies symptoms of UTI. He fell out his chair accidentally yesterday, no loss of consciousness. He has mild pain around his right nephrostomy tube.  He states that his nephrostomy tube was not changed for a while. He has dialysis catheter on right upper chest.   Data Reviewed and ED Course: pt was found to have WBC 5.7, negative COVID PCR, troponin level 132, 15, lactic acid 2.0, troponin 0, positive urinalysis (turbid appearance, large amount of leukocyte, negative bacteria, WBC >50), potassium 4.4, bicarbonate of 24, creatinine 7.28, BUN 59, temperature normal, blood pressure 180/132, 172/120, heart rate 87, heart 44, 18, oxygen saturation 91-98% on room air.  Chest x-ray showed cardiomegaly and the layering of pleural effusion.  Patient is admitted to telemetry bed as inpatient.  Dr. Juleen China of nephrology is consulted. Message sent to Dr. Denna Haggard of IR for possible change of nephrostomy.    CT-chest: Multiloculated right and dependent left moderate pleural effusions with associated bibasilar compressive atelectasis. Superimposed consolidation within the left lower lobe may reflect changes of acute infection or, more likely, rounded atelectasis. Correlation with the patient's clinical examination would be helpful in differentiating these entities.   Mild global cardiomegaly.   Right percutaneous nephrostomy is partially visualized but appears withdrawn from the right renal collecting system with its cope loop positioned adjacent to the lower pole the right kidney. Revision or removal is recommended.   Cholelithiasis   Aortic Atherosclerosis (ICD10-I70.0) and Emphysema (ICD10-J43.9).     EKG: I have personally reviewed.  Sinus rhythm, QTc 430, low voltage, nonspecific T wave change    Hospital course from Dr. Jimmye Norman 2/10-2/11/23: Pt was found to have possible UTI. Urine cx showed containment. Pt was placed on IV cefepime inpatient and d/c home w/ po cefdinir to complete the course.   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 Continue on IV cefepime while inpatient. Urine cx shows containment    ESRD: on MWF. Management  as per nephro    Malfunction of nephrostomy tube: s/p IR nephrostomy tube exchange on 07/01/21    Tobacco use disorder: nicotine patch to  prevent w/drawl. Smoking cessation counseling    HTN: pt is no longer taking coreg    ACD: likely secondary to ESRD. No need for a transfusion currently    Elevated troponin: trending down. Likely secondary to demand ischemia.    B/l Pleural effusions: s/p thoracentesis. Pleural fluid cx NGTD. Pathology shows lymphocyte predominant effusion    Chronic combined CHF : echo 04/12/2021 showed EF <20%. W/o exacerbation. Volume management w/ HD    Fall: CT head shows no acute intracranial findings. Wheel chair bound as per pt    Discharge Instructions  Discharge Instructions     Diet - low sodium heart healthy   Complete by: As directed    W/ renal diet   Discharge instructions   Complete by: As directed    F/u w/ PCP in 1-2 weeks. F/u w/ nephro in 1-2 weeks   Increase activity slowly   Complete by: As directed       Allergies as of 07/02/2021       Reactions   Vancomycin    Patient denies - repeated denial to pharm tech 09-05-2020        Medication List     TAKE these medications    aspirin 81 MG EC tablet Take 1 tablet (81 mg total) by mouth daily. Swallow whole.   carvedilol 25 MG tablet Commonly known as: COREG Take 1 tablet (25 mg total) by mouth 2 (two) times daily with a meal.   cefdinir 300 MG capsule Commonly known as: OMNICEF Take 1 capsule (300 mg total) by mouth 2 (two) times daily for 5 days.   feeding supplement (NEPRO CARB STEADY) Liqd Take 237 mLs by mouth 2 (two) times daily between meals.        Allergies  Allergen Reactions   Vancomycin     Patient denies - repeated denial to pharm tech 09-05-2020    Consultations: Nephro IR   Procedures/Studies: CT HEAD WO CONTRAST (5MM)  Result Date: 06/30/2021 CLINICAL DATA:  Minor head trauma, history of end-stage renal disease on dialysis, missed dialysis yesterday due to sliding out of chair onto floor, too weak to get up. History cardiomyopathy, hypertension, pulmonary hypertension, stroke,  smoker EXAM: CT HEAD WITHOUT CONTRAST TECHNIQUE: Contiguous axial images were obtained from the base of the skull through the vertex without intravenous contrast. RADIATION DOSE REDUCTION: This exam was performed according to the departmental dose-optimization program which includes automated exposure control, adjustment of the mA and/or kV according to patient size and/or use of iterative reconstruction technique. 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 Generalized atrophy. Normal ventricular morphology. No midline shift or mass effect. Large area of encephalomalacia at the LEFT occipital and posterior parietal lobes consistent with remote infarct. Mild small vessel chronic ischemic changes of deep cerebral white matter. No intracranial hemorrhage, mass lesion, or evidence of acute infarction. No extra-axial fluid collections. Vascular: No hyperdense vessels. Skull: Prior posterior LEFT parietooccipital craniotomy. No acute osseous findings. Sinuses/Orbits: Clear Other: N/A IMPRESSION: Atrophy with small vessel chronic ischemic changes of deep cerebral white matter. Prior aneurysm clipping posterior LEFT circulation. Large area of encephalomalacia at the LEFT occipital and posterior parietal lobes consistent with remote infarct. No acute intracranial abnormalities. Electronically Signed   By: Lavonia Dana M.D.   On: 06/30/2021 13:10   CT Chest W  Contrast  Result Date: 06/30/2021 CLINICAL DATA:  Pleural effusion, malignancy suspected. Fatigue. End-stage renal disease. EXAM: CT CHEST WITH CONTRAST TECHNIQUE: Multidetector CT imaging of the chest was performed during intravenous contrast administration. RADIATION DOSE REDUCTION: This exam was performed according to the departmental dose-optimization program which includes automated exposure control, adjustment of the mA and/or kV according to patient size and/or use of iterative reconstruction technique. CONTRAST:  27mL  OMNIPAQUE IOHEXOL 300 MG/ML  SOLN COMPARISON:  None. FINDINGS: Cardiovascular: No significant coronary artery calcification. Mild global cardiomegaly. No pericardial effusion. Central pulmonary arteries are of normal caliber. Mild atherosclerotic calcification within the thoracic aorta. No aortic aneurysm. Bovine arch anatomy. Arch vasculature is widely patent proximally. Stent graft is seen within the right basilic and axillary veins with extensive intimal hyperplasia within the stent lumen. The vessel, however, is still patent. Right internal jugular hemodialysis catheter is seen with its tips within the superior vena cava and right atrium. Mediastinum/Nodes: Thyroid unremarkable. No pathologic thoracic adenopathy. The esophagus is unremarkable. Small hiatal hernia. Lungs/Pleura: Multiloculated right pleural effusion is seen with small apicolateral and moderate basilar components. Moderate left pleural effusion is present dependently. There is compressive atelectasis of the lower lobes bilaterally. Superimposed consolidation within the left lower lobe noted peripherally posterior like ting changes of acute infection or rounded atelectasis. Mild biapical paraseptal emphysema. No central obstructing lesion. Upper Abdomen: Right percutaneous nephrostomy is partially visualized and appears to have been withdrawn with its cope loop now external to the lower pole the right kidney. Cholelithiasis noted. Musculoskeletal: No acute bone abnormality. No lytic or blastic bone lesion. IMPRESSION: Multiloculated right and dependent left moderate pleural effusions with associated bibasilar compressive atelectasis. Superimposed consolidation within the left lower lobe may reflect changes of acute infection or, more likely, rounded atelectasis. Correlation with the patient's clinical examination would be helpful in differentiating these entities. Mild global cardiomegaly. Right percutaneous nephrostomy is partially visualized but  appears withdrawn from the right renal collecting system with its cope loop positioned adjacent to the lower pole the right kidney. Revision or removal is recommended. Cholelithiasis Aortic Atherosclerosis (ICD10-I70.0) and Emphysema (ICD10-J43.9). Electronically Signed   By: Fidela Salisbury M.D.   On: 06/30/2021 01:43   DG Chest Port 1 View  Result Date: 06/30/2021 CLINICAL DATA:  Status post left-sided thoracentesis EXAM: PORTABLE CHEST 1 VIEW COMPARISON:  06/29/2021 FINDINGS: Right IJ hemodialysis catheter remains in place. Stable cardiomegaly. Moderate bilateral pleural effusions which may be minimally decreased on the left. Persistent left basilar opacity. No pneumothorax. Right subclavian vascular stent noted. IMPRESSION: Moderate bilateral pleural effusions which may be minimally decreased on the left. No pneumothorax. Electronically Signed   By: Davina Poke D.O.   On: 06/30/2021 11:28   DG Chest Port 1 View  Result Date: 06/29/2021 CLINICAL DATA:  Weakness and fatigue. EXAM: PORTABLE CHEST 1 VIEW COMPARISON:  Portable chest 04/16/2021, 04/13/2021. FINDINGS: A dialysis catheter via right IJ approach is again noted terminating in the upper right atrium. There is a vascular stent in the right axilla. Interval extubation and removal NGT. The heart is moderately enlarged and slight central vascular fullness is seen without overt edema. There are moderate-sized layering pleural effusions. The effusion on the right has increased in the interval, on left has decreased in the interval. There is a focal pleural-based opacity in the peripheral left mid lung just above the pleural fluid, showing parenchymal stranding medial to it. Hazy atelectasis versus pneumonitis is noted in the lower lung fields alongside the effusions. The  upper lung fields are clear. Thoracic cage is grossly intact. IMPRESSION: 1. Moderate-sized layering pleural effusions. 2. Cardiomegaly with mild central vascular prominence without  visible edema. 3. 5.6 x 3 cm pleural-based opacity lateral peripheral left mid field with adjacent stranding. This does appear to have been present on 04/13/2021 but was obscured by the larger volume of left pleural fluid on the 04/16/2021 exam. Differential diagnosis is rounded atelectasis, loculated fluid, or a pleural-parenchymal mass lesion. CT is recommended, preferably with contrast if the patient can tolerate contrast. 4. Hazy atelectasis or infiltrates alongside the effusions in the lower lung fields. Electronically Signed   By: Telford Nab M.D.   On: 06/29/2021 23:37   VAS [place] UPPER EXTREMITY ARTERIAL DUPLEX  Result Date: 06/09/2021  UPPER EXTREMITY DUPLEX STUDY Patient Name:  JAHQUEZ STEFFLER  Date of Exam:   06/09/2021 Medical Rec #: [id number]     Accession #:    [id number] Date of Birth: Sep 24, 1970      Patient Gender: M Patient Age:   77 years Exam Location:  [facility] Procedure:      VAS [place] UPPER EXTREMITY ARTERIAL DUPLEX Referring Phys: Eulogio Ditch --------------------------------------------------------------------------------  Limitations: failed right brachax needs new access Performing Technologist: Concha Norway RVT  Examination Guidelines: A complete evaluation includes B-mode imaging, spectral Doppler, color Doppler, and power Doppler as needed of all accessible portions of each vessel. Bilateral testing is considered an integral part of a complete examination. Limited examinations for reoccurring indications may be performed as noted.  Left Pre-Dialysis Findings: +-----------------------+----------+--------------------+----------+--------+  Location                PSV (cm/s) Intralum. Diam. (cm) Waveform   Comments  +-----------------------+----------+--------------------+----------+--------+  Brachial Antecub. fossa 81         0.39                 triphasic            +-----------------------+----------+--------------------+----------+--------+  Radial Art at Wrist     47          0.14                 biphasic             +-----------------------+----------+--------------------+----------+--------+  Ulnar Art at Wrist      51         0.17                 monophasic           +-----------------------+----------+--------------------+----------+--------+  Summary:  Left: Mild atheosclerosis in the distal radial and ulnar arteries.       Brachial artery shows triphasic flow and minimal       atherosclerosis. *See table(s) above for measurements and observations. Electronically signed by [name] MD on 06/09/2021 at 4:27:22 PM.    Final    IR NEPHROSTOMY EXCHANGE RIGHT  Result Date: 07/01/2021 INDICATION: Right nephrostomy tube, routine exchange EXAM: Exchange of right nephrostomy tube using fluoroscopic guidance COMPARISON:  None. MEDICATIONS: Per EMR ANESTHESIA/SEDATION: Moderate (conscious) sedation was employed during this procedure. A total of Versed 1 mg and Fentanyl 100 mcg was administered intravenously by the radiology nurse. Total intra-service moderate Sedation Time: 50 minutes. The patient's level of consciousness and vital signs were monitored continuously by radiology nursing throughout the procedure under my direct supervision. CONTRAST:  10 mL-administered into the collecting system(s) FLUOROSCOPY: Radiation Exposure Index (as provided by the fluoroscopic device): 2.2 minutes (18 mGy) COMPLICATIONS: None  immediate. PROCEDURE: Informed written consent was obtained from the patient after a thorough discussion of the procedural risks, benefits and alternatives. All questions were addressed. Maximal Sterile Barrier Technique was utilized including caps, mask, sterile gowns, sterile gloves, sterile drape, hand hygiene and skin antiseptic. A timeout was performed prior to the initiation of the procedure. The patient was placed prone on the exam table. The right flank was prepped and draped in the standard sterile fashion with inclusion of the existing right nephrostomy tube  within the sterile field. Injection of the existing nephrostomy tube demonstrated that it was retracted into a peripheral calyx. While keeping the locking loop formed, a stiff angled Glidewire was carefully manipulated through 1 of the and holes into the renal pelvis, and directed into the distal ureter. The locking loop was released, and the existing nephrostomy tube removed. Over this wire, a new 10.2 French percutaneous nephrostomy tube was advanced and positioned within the renal pelvis. Locking loop was formed. Injection of contrast material through the existing catheter demonstrated appropriate location within the renal pelvis. It was secured to the skin using silk suture and a dressing. It was attached to bag drainage. The patient tolerated the procedure well without immediate complication. IMPRESSION: Successful exchange of right percutaneous nephrostomy tube for a new 10.2 French percutaneous nephrostomy tube. Nephrostomy tube to stay to bag drainage. Patient to return to Interventional Radiology in 12 weeks for routine exchange. Electronically Signed   By: Albin Felling M.D.   On: 07/01/2021 15:24   VAS [place] UPPER EXT VEIN MAPPING (PRE-OP AVF)  Result Date: 06/09/2021 UPPER EXTREMITY VEIN MAPPING Patient Name:  SHARVIL HOEY  Date of Exam:   06/09/2021 Medical Rec #: [id number]     Accession #:    [id number] Date of Birth: [birth year]      Patient Gender: M Patient Age:   34 years Exam Location:  [facility] Procedure:      VAS [place] UPPER EXT VEIN MAPPING (PRE-OP AVF) Referring Phys: Eulogio Ditch --------------------------------------------------------------------------------  Indications: Pre-access. History: Failed right brachax needs new access.  Performing Technologist: Concha Norway RVT  Examination Guidelines: A complete evaluation includes B-mode imaging, spectral Doppler, color Doppler, and power Doppler as needed of all accessible portions of each vessel. Bilateral testing is considered an  integral part of a complete examination. Limited examinations for reoccurring indications may be performed as noted. +-----------------+-------------+----------+--------+  Left Cephalic     Diameter (cm) Depth (cm) Findings  +-----------------+-------------+----------+--------+  Prox upper arm        0.11                           +-----------------+-------------+----------+--------+  Mid upper arm         0.08                           +-----------------+-------------+----------+--------+  Dist upper arm        0.09                           +-----------------+-------------+----------+--------+  Antecubital fossa     0.07                           +-----------------+-------------+----------+--------+  Prox forearm          0.05                           +-----------------+-------------+----------+--------+  Mid forearm           0.09                           +-----------------+-------------+----------+--------+  Dist forearm          0.11                           +-----------------+-------------+----------+--------+ +-----------------+-------------+----------+---------+  Left Basilic      Diameter (cm) Depth (cm) Findings   +-----------------+-------------+----------+---------+  Prox upper arm        0.18                            +-----------------+-------------+----------+---------+  Mid upper arm         0.25                 branching  +-----------------+-------------+----------+---------+  Dist upper arm        0.27                            +-----------------+-------------+----------+---------+  Antecubital fossa     0.15                            +-----------------+-------------+----------+---------+  Prox forearm          0.22                            +-----------------+-------------+----------+---------+ Summary:  Left: Compressible veins. Small diameter cephalic vein. *See table(s) above for measurements and observations.  Diagnosing physician: [name] MD Electronically signed by [name] MD on 06/09/2021 at 4:27:29 PM.    Final    US THORACENTESIS ASP PLEURAL SPACE W/IMG GUIDE  Result Date: 06/30/2021 INDICATION: History of ESRD on HD with recurrent left pleural effusion. Request for diagnostic therapeutic thoracentesis. EXAM: ULTRASOUND GUIDED DIAGNOSTIC AND THERAPEUTIC LEFT THORACENTESIS MEDICATIONS: None. COMPLICATIONS: 10 mL 1 % lidocaine PROCEDURE: An ultrasound guided thoracentesis was thoroughly discussed with the patient and questions answered. The benefits, risks, alternatives and complications were also discussed. The patient understands and wishes to proceed with the procedure. Written consent was obtained. Ultrasound was performed to localize and mark an adequate pocket of fluid in the left chest. The area was then prepped and draped in the normal sterile fashion. 1% Lidocaine was used for local anesthesia. Under ultrasound guidance a 6 Fr Safe-T-Centesis catheter was introduced. Thoracentesis was performed. The catheter was removed and a dressing applied. FINDINGS: A total of approximately 500 cc of serosanguineous fluid was removed. Samples were sent to the laboratory as requested by the clinical team. IMPRESSION: Successful ultrasound guided left thoracentesis yielding 500 cc of pleural fluid. Read by: Narda Rutherford, AGNP-BC Electronically Signed   By: Albin Felling M.D.   On: 06/30/2021 10:45   (Echo, Carotid, EGD, Colonoscopy, ERCP)    Subjective: Pt denies any complaints and wants to go home    Discharge Exam: Vitals:   07/01/21 2211 07/02/21 0831  BP: (!) 149/99 (!) 149/97  Pulse: 87 90  Resp: (!) 22 16  Temp: 98.4 F (36.9 C) 98.5 F (36.9 C)  SpO2: 97% 94%   Vitals:   07/01/21 1530 07/01/21 1555 07/01/21 2211 07/02/21 0831  BP: 136/89 (!) 152/95 (!) 149/99 Marland Kitchen)  149/97  Pulse: 74 77 87 90  Resp: (!) 23 16 (!) 22 16  Temp:  97.6 F (36.4 C) 98.4 F (36.9 C) 98.5 F (36.9 C)  TempSrc:   Oral Oral  SpO2: 99% 100% 97% 94%  Weight:      Height:         General: Pt is alert, awake, not in acute distress Cardiovascular: S1/S2 +, no rubs, no gallops Respiratory: decreased breath sounds b/l otherwise clear  Abdominal: Soft, NT, ND, bowel sounds + Extremities: no cyanosis    The results of significant diagnostics from this hospitalization (including imaging, microbiology, ancillary and laboratory) are listed below for reference.     Microbiology: Recent Results (from the past 240 hour(s))  Culture, blood (routine x 2)     Status: None (Preliminary result)   Collection Time: 06/29/21 11:29 PM   Specimen: BLOOD  Result Value Ref Range Status   Specimen Description BLOOD RIGHT FOREARM  Final   Special Requests   Final    BOTTLES DRAWN AEROBIC AND ANAEROBIC Blood Culture adequate volume   Culture   Final    NO GROWTH 3 DAYS Performed at [facility], [address] [place], [place] [address]    Report Status PENDING  Incomplete  Culture, blood (routine x 2)     Status: None (Preliminary result)   Collection Time: 06/29/21 11:29 PM   Specimen: BLOOD  Result Value Ref Range Status   Specimen Description BLOOD RIGHT ARM  Final   Special Requests IN PEDIATRIC BOTTLE Blood Culture adequate volume  Final   Culture   Final    NO GROWTH 3 DAYS Performed at [facility], [address], [place], [place] [address]    Report Status PENDING  Incomplete  Urine Culture     Status: Abnormal   Collection Time: 06/29/21 11:49 PM   Specimen: Urine, Random  Result Value Ref Range Status   Specimen Description   Final    URINE, RANDOM Performed at [facility], [address], [place], [place] [address]    Special Requests   Final    NONE Performed at [facility], [address] [place], [place] [address]    Culture MULTIPLE SPECIES PRESENT, SUGGEST RECOLLECTION (A)  Final   Report Status 07/01/2021 FINAL  Final  Resp Panel by RT-PCR (Flu A&B, Covid)     Status: None   Collection Time:  06/29/21 11:49 PM   Specimen: Nasopharyngeal(NP) swabs in vial transport medium  Result Value Ref Range Status   SARS Coronavirus 2 by RT PCR NEGATIVE NEGATIVE Final    Comment: (NOTE) SARS-CoV-2 target nucleic acids are NOT DETECTED.  The SARS-CoV-2 RNA is generally detectable in upper respiratory specimens during the acute phase of infection. The lowest concentration of SARS-CoV-2 viral copies this assay can detect is 138 copies/mL. A negative result does not preclude SARS-Cov-2 infection and should not be used as the sole basis for treatment or other patient management decisions. A negative result may occur with  improper specimen collection/handling, submission of specimen other than nasopharyngeal swab, presence of viral mutation(s) within the areas targeted by this assay, and inadequate number of viral copies(<138 copies/mL). A negative result must be combined with clinical observations, patient history, and epidemiological information. The expected result is Negative.  Fact Sheet for Patients:  EntrepreneurPulse.com.au  Fact Sheet for Healthcare Providers:  IncredibleEmployment.be  This test is no t yet approved or cleared by the [place] and  has been authorized for detection and/or diagnosis of SARS-CoV-2 by FDA under an Emergency Use Authorization (EUA). This EUA will remain  in effect (meaning this test can be used) for the duration of the COVID-19 declaration under Section 564(b)(1) of the Act, 21 U.S.C.section 360bbb-3(b)(1), unless the authorization is terminated  or revoked sooner.       Influenza A by PCR NEGATIVE NEGATIVE Final   Influenza B by PCR NEGATIVE NEGATIVE Final    Comment: (NOTE) The Xpert Xpress SARS-CoV-2/FLU/RSV plus assay is intended as an aid in the diagnosis of influenza from Nasopharyngeal swab specimens and should not be used as a sole basis for treatment. Nasal washings and aspirates are  unacceptable for Xpert Xpress SARS-CoV-2/FLU/RSV testing.  Fact Sheet for Patients: EntrepreneurPulse.com.au  Fact Sheet for Healthcare Providers: IncredibleEmployment.be  This test is not yet approved or cleared by the [place] FDA and has been authorized for detection and/or diagnosis of SARS-CoV-2 by FDA under an Emergency Use Authorization (EUA). This EUA will remain in effect (meaning this test can be used) for the duration of the COVID-19 declaration under Section 564(b)(1) of the Act, 21 U.S.C. section 360bbb-3(b)(1), unless the authorization is terminated or revoked.  Performed at [facility], [address] [place], [place] [address]   Body fluid culture w Gram Stain     Status: None (Preliminary result)   Collection Time: 06/30/21 10:46 AM   Specimen: PATH Cytology Pleural fluid  Result Value Ref Range Status   Specimen Description   Final    PLEURAL Performed at [facility], [address], [place], [place] [address]    Special Requests   Final    NONE Performed at [facility], [address] [place], [place] [address]    Gram Stain   Final    NO SQUAMOUS EPITHELIAL CELLS SEEN FEW WBC SEEN NO ORGANISMS SEEN    Culture   Final    NO GROWTH 2 DAYS Performed at [facility], [place] [address] [place], [place] [address]    Report Status PENDING  Incomplete  MRSA Next Gen by PCR, Nasal     Status: None   Collection Time: 07/01/21  4:40 AM   Specimen: Nasal Mucosa; Nasal Swab  Result Value Ref Range Status   MRSA by PCR Next Gen NOT DETECTED NOT DETECTED Final    Comment: (NOTE) The GeneXpert MRSA Assay (FDA approved for NASAL specimens only), is one component of a comprehensive MRSA colonization surveillance program. It is not intended to diagnose MRSA infection nor to guide or monitor treatment for MRSA infections. Test performance is not FDA approved in patients less than 12  years old. Performed at [facility], [address] [place], [place] [address]      Labs: BNP (last 3 results) Recent Labs    02/28/21 1456 04/09/21 1653  BNP >4,500.0* >2,409.7*   Basic Metabolic Panel: Recent Labs  Lab 06/29/21 2329 07/01/21 0615 07/02/21 0853  NA 143 136 133*  K 4.4 4.9 3.8  CL 108 104 99  CO2 24 19* 24  GLUCOSE 84 86 91  BUN 59* 73* 43*  CREATININE 7.28* 8.02* 5.58*  CALCIUM 8.1* 7.8* 7.5*   Liver Function Tests: Recent Labs  Lab 06/29/21 2329  AST 24  ALT 20  ALKPHOS 116  BILITOT 0.6  PROT 8.8*  ALBUMIN 2.9*   No results for input(s): LIPASE, AMYLASE in the last 168 hours. No results for input(s): AMMONIA in the  last 168 hours. CBC: Recent Labs  Lab 06/29/21 2329 07/02/21 0853  WBC 5.7 6.9  NEUTROABS 3.9  --   HGB 9.8* 8.8*  HCT 31.8* 27.3*  MCV 100.6* 96.5  PLT 247 198   Cardiac Enzymes: No results for input(s): CKTOTAL, CKMB, CKMBINDEX, TROPONINI in the last 168 hours. BNP: Invalid input(s): POCBNP CBG: Recent Labs  Lab 07/01/21 0743 07/02/21 0835  GLUCAP 85 89   D-Dimer No results for input(s): DDIMER in the last 72 hours. Hgb A1c Recent Labs    06/30/21 0907  HGBA1C 5.0   Lipid Profile Recent Labs    07/01/21 0615  CHOL 74  HDL 31*  LDLCALC 29  TRIG 70  CHOLHDL 2.4   Thyroid function studies Recent Labs    06/29/21 2329  TSH 10.019*   Anemia work up No results for input(s): VITAMINB12, FOLATE, FERRITIN, TIBC, IRON, RETICCTPCT in the last 72 hours. Urinalysis    Component Value Date/Time   COLORURINE YELLOW (A) 06/29/2021 2349   APPEARANCEUR TURBID (A) 06/29/2021 2349   APPEARANCEUR Turbid 09/19/2014 1914   LABSPEC 1.014 06/29/2021 2349   LABSPEC 1.011 09/19/2014 1914   PHURINE 8.0 06/29/2021 2349   GLUCOSEU NEGATIVE 06/29/2021 2349   GLUCOSEU Negative 09/19/2014 1914   HGBUR SMALL (A) 06/29/2021 2349   BILIRUBINUR NEGATIVE 06/29/2021 2349   BILIRUBINUR Negative 09/19/2014  1914   KETONESUR NEGATIVE 06/29/2021 2349   PROTEINUR >=300 (A) 06/29/2021 2349   NITRITE NEGATIVE 06/29/2021 2349   LEUKOCYTESUR LARGE (A) 06/29/2021 2349   LEUKOCYTESUR 2+ 09/19/2014 1914   Sepsis Labs Invalid input(s): PROCALCITONIN,  WBC,  LACTICIDVEN Microbiology Recent Results (from the past 240 hour(s))  Culture, blood (routine x 2)     Status: None (Preliminary result)   Collection Time: 06/29/21 11:29 PM   Specimen: BLOOD  Result Value Ref Range Status   Specimen Description BLOOD RIGHT FOREARM  Final   Special Requests   Final    BOTTLES DRAWN AEROBIC AND ANAEROBIC Blood Culture adequate volume   Culture   Final    NO GROWTH 3 DAYS Performed at [facility], [address], [place], [place] [address]    Report Status PENDING  Incomplete  Culture, blood (routine x 2)     Status: None (Preliminary result)   Collection Time: 06/29/21 11:29 PM   Specimen: BLOOD  Result Value Ref Range Status   Specimen Description BLOOD RIGHT ARM  Final   Special Requests IN PEDIATRIC BOTTLE Blood Culture adequate volume  Final   Culture   Final    NO GROWTH 3 DAYS Performed at [facility], [address], [place], [place] [address]    Report Status PENDING  Incomplete  Urine Culture     Status: Abnormal   Collection Time: 06/29/21 11:49 PM   Specimen: Urine, Random  Result Value Ref Range Status   Specimen Description   Final    URINE, RANDOM Performed at [facility], [address], [place], [place] [address]    Special Requests   Final    NONE Performed at [facility], [address] [place],  [address]    Culture MULTIPLE SPECIES PRESENT, SUGGEST RECOLLECTION (A)  Final   Report Status 07/01/2021 FINAL  Final  Resp Panel by RT-PCR (Flu A&B, Covid)     Status: None   Collection Time: 06/29/21 11:49 PM   Specimen: Nasopharyngeal(NP) swabs in vial transport medium  Result Value Ref Range Status   SARS Coronavirus 2  by RT PCR  NEGATIVE NEGATIVE Final    Comment: (NOTE) SARS-CoV-2 target nucleic acids are NOT DETECTED.  The SARS-CoV-2 RNA is generally detectable in upper respiratory specimens during the acute phase of infection. The lowest concentration of SARS-CoV-2 viral copies this assay can detect is 138 copies/mL. A negative result does not preclude SARS-Cov-2 infection and should not be used as the sole basis for treatment or other patient management decisions. A negative result may occur with  improper specimen collection/handling, submission of specimen other than nasopharyngeal swab, presence of viral mutation(s) within the areas targeted by this assay, and inadequate number of viral copies(<138 copies/mL). A negative result must be combined with clinical observations, patient history, and epidemiological information. The expected result is Negative.  Fact Sheet for Patients:  EntrepreneurPulse.com.au  Fact Sheet for Healthcare Providers:  IncredibleEmployment.be  This test is no t yet approved or cleared by the [place] FDA and  has been authorized for detection and/or diagnosis of SARS-CoV-2 by FDA under an Emergency Use Authorization (EUA). This EUA will remain  in effect (meaning this test can be used) for the duration of the COVID-19 declaration under Section 564(b)(1) of the Act, 21 U.S.C.section 360bbb-3(b)(1), unless the authorization is terminated  or revoked sooner.       Influenza A by PCR NEGATIVE NEGATIVE Final   Influenza B by PCR NEGATIVE NEGATIVE Final    Comment: (NOTE) The Xpert Xpress SARS-CoV-2/FLU/RSV plus assay is intended as an aid in the diagnosis of influenza from Nasopharyngeal swab specimens and should not be used as a sole basis for treatment. Nasal washings and aspirates are unacceptable for Xpert Xpress SARS-CoV-2/FLU/RSV testing.  Fact Sheet for Patients: EntrepreneurPulse.com.au  Fact Sheet for  Healthcare Providers: IncredibleEmployment.be  This test is not yet approved or cleared by the [place] FDA and has been authorized for detection and/or diagnosis of SARS-CoV-2 by FDA under an Emergency Use Authorization (EUA). This EUA will remain in effect (meaning this test can be used) for the duration of the COVID-19 declaration under Section 564(b)(1) of the Act, 21 U.S.C. section 360bbb-3(b)(1), unless the authorization is terminated or revoked.  Performed at [facility], [address] [place], [place] [address]   Body fluid culture w Gram Stain     Status: None (Preliminary result)   Collection Time: 06/30/21 10:46 AM   Specimen: PATH Cytology Pleural fluid  Result Value Ref Range Status   Specimen Description   Final    PLEURAL Performed at [facility], [address] [place], [place] [address]    Special Requests   Final    NONE Performed at [facility], [address] [place], [place] [address]    Gram Stain   Final    NO SQUAMOUS EPITHELIAL CELLS SEEN FEW WBC SEEN NO ORGANISMS SEEN    Culture   Final    NO GROWTH 2 DAYS Performed at [facility], [place] [address], [place], [place] [address]    Report Status PENDING  Incomplete  MRSA Next Gen by PCR, Nasal     Status: None   Collection Time: 07/01/21  4:40 AM   Specimen: Nasal Mucosa; Nasal Swab  Result Value Ref Range Status   MRSA by PCR Next Gen NOT DETECTED NOT DETECTED Final    Comment: (NOTE) The GeneXpert MRSA Assay (FDA approved for NASAL specimens only), is one component of a comprehensive MRSA colonization surveillance program. It is not intended to diagnose MRSA infection nor to  guide or monitor treatment for MRSA infections. Test performance is not FDA approved in patients less than 22 years old. Performed at [facility], [address], [place], [place] [address]      Time coordinating discharge: Over 30  minutes  SIGNED:   Wyvonnia Dusky, MD  [facility] 07/02/2021, 12:37 PM Pager   If 7PM-7AM, please contact night-coverage

## 2021-07-02 NOTE — Progress Notes (Signed)
Reviewed discharge instructions with pt.  Pt verbalized understanding. IV intact upon removal. Dialysis cath in place and urostomy in place upon discharge. Ems transported pt to home at this time.

## 2021-07-02 NOTE — TOC Transition Note (Signed)
Transition of Care [facility]) - CM/SW Discharge Note   Patient Details  Name: Larry Morrison MRN: [id number] Date of Birth: [birth year]  [facility] CM/SW Contact:  Elliot Gurney [place], [place] Phone Number: 07/02/2021, 3:46 PM   Clinical Narrative:     Patient to discharge home today. Per patient's sister he is wheelchair bound and will need EMS transport. Transportation arranged.  [address] LCSW Transition of Care [phone]         Patient Goals and CMS Choice        Discharge Placement                       Discharge Plan and Services                                     Social Determinants of Health (SDOH) Interventions     Readmission Risk Interventions Readmission Risk Prevention Plan 04/12/2021 09/08/2020  Transportation Screening Complete Complete  Social Work Consult for [place] Planning/Counseling - Complete  Palliative Care Screening - Complete  Medication Review Press photographer) Complete Complete  PCP or Specialist appointment within 3-5 days of discharge Complete -  [place] or [place] Complete -  [place] or [place] Pt Refusal Comments has in home care aids -  SW Recovery Care/Counseling Consult Not Complete -  SW Consult Not Complete Comments pt intubated -  Palliative Care Screening Not Complete -  Comments intubated -  Skilled Nursing Facility Not Complete -  SNF Comments intubated -  Some recent data might be hidden

## 2021-07-03 LAB — CHOLESTEROL, BODY FLUID: Cholesterol, Fluid: 27 mg/dL

## 2021-07-04 LAB — BODY FLUID CULTURE W GRAM STAIN
Culture: NO GROWTH
Gram Stain: NONE SEEN

## 2021-07-04 LAB — CULTURE, BLOOD (ROUTINE X 2)
Culture: NO GROWTH
Culture: NO GROWTH
Special Requests: ADEQUATE
Special Requests: ADEQUATE

## 2021-07-06 ENCOUNTER — Telehealth (INDEPENDENT_AMBULATORY_CARE_PROVIDER_SITE_OTHER): Payer: Self-pay

## 2021-07-06 NOTE — Telephone Encounter (Signed)
I spoke with Will at [facility] regarding getting the patient rescheduled for his left brachial axillary graft surgery with Dr. Delana Meyer. Per Will the patient just got out of the hospital and has not had his cardiac appt with [facility]. Will stated he is working on getting the patient cardiac cleared as well as his transportation taken care and then he will reach out to me with the information.

## 2021-08-02 LAB — MISC LABCORP TEST (SEND OUT): Labcorp test code: 9985

## 2021-08-09 ENCOUNTER — Encounter: Payer: Self-pay | Admitting: Cardiology

## 2021-08-09 ENCOUNTER — Ambulatory Visit (INDEPENDENT_AMBULATORY_CARE_PROVIDER_SITE_OTHER): Payer: Medicaid Other | Admitting: Cardiology

## 2021-08-09 ENCOUNTER — Other Ambulatory Visit: Payer: Self-pay

## 2021-08-09 VITALS — BP 158/110 | HR 90 | Ht 64.0 in | Wt 120.0 lb

## 2021-08-09 DIAGNOSIS — Z01810 Encounter for preprocedural cardiovascular examination: Secondary | ICD-10-CM | POA: Diagnosis not present

## 2021-08-09 DIAGNOSIS — I429 Cardiomyopathy, unspecified: Secondary | ICD-10-CM | POA: Diagnosis not present

## 2021-08-09 DIAGNOSIS — I10 Essential (primary) hypertension: Secondary | ICD-10-CM

## 2021-08-09 DIAGNOSIS — F17200 Nicotine dependence, unspecified, uncomplicated: Secondary | ICD-10-CM | POA: Diagnosis not present

## 2021-08-09 MED ORDER — CARVEDILOL 12.5 MG PO TABS: 12.5000 mg | ORAL_TABLET | Freq: Two times a day (BID) | ORAL | 5 refills | Status: DC

## 2021-08-09 MED ORDER — IRBESARTAN 150 MG PO TABS: 150.0000 mg | ORAL_TABLET | Freq: Every day | ORAL | 5 refills | Status: DC

## 2021-08-09 NOTE — Patient Instructions (Signed)
Medication Instructions:  ? ?Your physician has recommended you make the following change in your medication:  ? ? START taking Irbesartan (Avapro) 150 MG once a day. ? ?2.    START taking Carvedilol (Coreg) 12.5 MG twice a day. ? ? ?*If you need a refill on your cardiac medications before your next appointment, please call your pharmacy* ? ? ?Lab Work: ? ?None ordered ? ?If you have labs (blood work) drawn today and your tests are completely normal, you will receive your results only by: ?MyChart Message (if you have MyChart) OR ?A paper copy in the mail ?If you have any lab test that is abnormal or we need to change your treatment, we will call you to review the results. ? ? ?Testing/Procedures: ? ?None ordered ? ? ?Follow-Up: ?At [facility], you and your health needs are our priority.  As part of our continuing mission to provide you with exceptional heart care, we have created designated Provider Care Teams.  These Care Teams include your primary Cardiologist (physician) and Advanced Practice Providers (APPs -  Physician Assistants and Nurse Practitioners) who all work together to provide you with the care you need, when you need it. ? ?We recommend signing up for the patient portal called "MyChart".  Sign up information is provided on this After Visit Summary.  MyChart is used to connect with patients for Virtual Visits (Telemedicine).  Patients are able to view lab/test results, encounter notes, upcoming appointments, etc.  Non-urgent messages can be sent to your provider as well.   ?To learn more about what you can do with MyChart, go to [email or web address].   ? ?Your next appointment:   ?2-3 Months  ? ?The format for your next appointment:   ?In Person ? ?Provider:   ?You may see Kate Sable, MD or one of the following Advanced Practice Providers on your designated Care Team:   ?Murray Hodgkins, NP ?Christell Faith, PA-C ?Cadence Kathlen Mody, PA-C  ? ? ?Other Instructions ? ? ?
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
## 2021-08-10 ENCOUNTER — Telehealth (INDEPENDENT_AMBULATORY_CARE_PROVIDER_SITE_OTHER): Payer: Self-pay

## 2021-08-10 NOTE — Telephone Encounter (Signed)
Spoke with [name] at [facility] and the patient has been cleared by Dr. Kate Sable at [facility]. The patient has been scheduled with Dr. Delana Meyer for a left brachial axillary graft on 08/31/21 at [facility]. Pre-op phone call is on 08/24/21 between 1-5 pm. Pre-surgical instructions will be faxed to attention [name] at [facility]. ?

## 2021-08-14 ENCOUNTER — Emergency Department: Payer: Medicaid Other

## 2021-08-14 ENCOUNTER — Emergency Department
Admission: EM | Admit: 2021-08-14 | Discharge: 2021-08-14 | Disposition: A | Discharge status: Home / Self Care | Payer: Medicaid Other | Attending: Emergency Medicine | Admitting: Emergency Medicine

## 2021-08-14 ENCOUNTER — Encounter: Payer: Self-pay | Admitting: Emergency Medicine

## 2021-08-14 ENCOUNTER — Other Ambulatory Visit: Payer: Self-pay

## 2021-08-14 DIAGNOSIS — N186 End stage renal disease: Secondary | ICD-10-CM | POA: Diagnosis not present

## 2021-08-14 DIAGNOSIS — N390 Urinary tract infection, site not specified: Secondary | ICD-10-CM | POA: Insufficient documentation

## 2021-08-14 DIAGNOSIS — Z992 Dependence on renal dialysis: Secondary | ICD-10-CM | POA: Insufficient documentation

## 2021-08-14 DIAGNOSIS — E875 Hyperkalemia: Secondary | ICD-10-CM | POA: Diagnosis not present

## 2021-08-14 DIAGNOSIS — I509 Heart failure, unspecified: Secondary | ICD-10-CM | POA: Diagnosis not present

## 2021-08-14 DIAGNOSIS — R109 Unspecified abdominal pain: Secondary | ICD-10-CM | POA: Diagnosis present

## 2021-08-14 LAB — URINALYSIS, COMPLETE (UACMP) WITH MICROSCOPIC
Bilirubin Urine: NEGATIVE
Glucose, UA: NEGATIVE mg/dL
Ketones, ur: NEGATIVE mg/dL
Nitrite: NEGATIVE
Protein, ur: >=300 mg/dL — AB
RBC / HPF: >50 RBC/hpf — ABNORMAL HIGH (ref 0–5)
Specific Gravity, Urine: 1.013 (ref 1.005–1.030)
Squamous Epithelial / HPF: NONE SEEN (ref 0–5)
WBC, UA: >50 WBC/hpf — ABNORMAL HIGH (ref 0–5)
pH: 9 — ABNORMAL HIGH (ref 5.0–8.0)

## 2021-08-14 LAB — COMPREHENSIVE METABOLIC PANEL
ALT: 24 U/L (ref 0–44)
AST: 35 U/L (ref 15–41)
Albumin: 3 g/dL — ABNORMAL LOW (ref 3.5–5.0)
Alkaline Phosphatase: 150 U/L — ABNORMAL HIGH (ref 38–126)
Anion gap: 18 — ABNORMAL HIGH (ref 5–15)
BUN: 90 mg/dL — ABNORMAL HIGH (ref 6–20)
CO2: 21 mmol/L — ABNORMAL LOW (ref 22–32)
Calcium: 7.8 mg/dL — ABNORMAL LOW (ref 8.9–10.3)
Chloride: 105 mmol/L (ref 98–111)
Creatinine, Ser: 7.99 mg/dL — ABNORMAL HIGH (ref 0.61–1.24)
GFR, Estimated: 7 mL/min — ABNORMAL LOW (ref >60)
Glucose, Bld: 72 mg/dL (ref 70–99)
Potassium: 5.3 mmol/L — ABNORMAL HIGH (ref 3.5–5.1)
Sodium: 144 mmol/L (ref 135–145)
Total Bilirubin: 1.1 mg/dL (ref 0.3–1.2)
Total Protein: 9.1 g/dL — ABNORMAL HIGH (ref 6.5–8.1)

## 2021-08-14 LAB — CBC
HCT: 36.7 % — ABNORMAL LOW (ref 39.0–52.0)
Hemoglobin: 11 g/dL — ABNORMAL LOW (ref 13.0–17.0)
MCH: 29.8 pg (ref 26.0–34.0)
MCHC: 30 g/dL (ref 30.0–36.0)
MCV: 99.5 fL (ref 80.0–100.0)
Platelets: 232 10*3/uL (ref 150–400)
RBC: 3.69 MIL/uL — ABNORMAL LOW (ref 4.22–5.81)
RDW: 17.2 % — ABNORMAL HIGH (ref 11.5–15.5)
WBC: 6.4 10*3/uL (ref 4.0–10.5)
nRBC: 0 % (ref 0.0–0.2)

## 2021-08-14 MED ORDER — CEFTRIAXONE SODIUM 1 G IJ SOLR: 1.0000 g | Freq: Once | INTRAMUSCULAR | Status: DC

## 2021-08-14 MED ORDER — CEPHALEXIN 250 MG PO CAPS: 250.0000 mg | ORAL_CAPSULE | Freq: Two times a day (BID) | ORAL | 0 refills | Status: AC

## 2021-08-14 MED ORDER — SODIUM CHLORIDE 0.9 % IV SOLN
1.0000 g | Freq: Once | INTRAVENOUS | Status: DC
  Administered 2021-08-14: 1 g via INTRAVENOUS
  Filled 2021-08-14: qty 10

## 2021-08-14 MED ORDER — SODIUM CHLORIDE 0.9 % IV SOLN: 1.0000 g | Freq: Once | INTRAVENOUS | Status: DC

## 2021-08-14 NOTE — ED Notes (Addendum)
Patient resting comfortably on stretcher with eyes closed. RR even and unlabored. Patient verbalizes no needs or complaints at this time. Still awaiting transport. ?

## 2021-08-14 NOTE — Discharge Instructions (Addendum)
Please begin taking your antibiotics tomorrow as prescribed.  Please follow-up for dialysis first thing in the morning at your normal time in place.  Return to the emergency department for any worsening pain, fever, or any other symptom personally concerning to yourself. ?

## 2021-08-14 NOTE — ED Notes (Signed)
Discharge instructions provided to patient. Patient verbalized understnding.  ?

## 2021-08-14 NOTE — ED Triage Notes (Signed)
Pt in via EMS from home with c/o pain to right side at urostomy site. Pt has has urostomy entire life but pain is new.  ?

## 2021-08-14 NOTE — ED Notes (Signed)
[facility] to transport pt to home 

## 2021-08-14 NOTE — ED Triage Notes (Signed)
Pt via POV from home. Pt c/o pain at this R sided nephrostomy site, pt states he has had it all his life but states the pain started today. Site looks clean and intact. Sutures intact. No dislodgement noted. Pt is A&OX4 and NAD ?

## 2021-08-14 NOTE — ED Provider Notes (Signed)
? ?[facility] ?Provider Note ? ? ? Event Date/Time  ? First MD Initiated Contact with Patient 08/14/21 1737   ?  (approximate) ? ?History  ? ?Chief Complaint: Wound Check ? ?HPI ? ?Larry Morrison is a 51 y.o. male with a past medical history of anemia, CHF, ESRD, neurogenic bladder with right nephrostomy tube who presents to the emergency department for right flank pain.  According to the patient he has had the nephrostomy tube for as long as he can remember but over the last several days has been experiencing some pain in the right flank.  Patient denies any vomiting or diarrhea.  Patient was concerned over the pain so he came to the emergency department for evaluation. ? ?Physical Exam  ? ?Triage Vital Signs: ?ED Triage Vitals  ?Enc Vitals Group  ?   BP 08/14/21 1608 (!) 165/107  ?   Pulse Rate 08/14/21 1608 86  ?   Resp 08/14/21 1608 20  ?   Temp 08/14/21 1608 98.4 ?F (36.9 ?C)  ?   Temp Source 08/14/21 1608 Oral  ?   SpO2 08/14/21 1608 98 %  ?   Weight 08/14/21 1605 120 lb (54.4 kg)  ?   Height 08/14/21 1605 5\' 4"  (1.626 m)  ?   Head Circumference --   ?   Peak Flow --   ?   Pain Score 08/14/21 1605 5  ?   Pain Loc --   ?   Pain Edu? --   ?   Excl. in [place]? --   ? ? ?Most recent vital signs: ?Vitals:  ? 08/14/21 1608 08/14/21 1738  ?BP: (!) 165/107 (!) 159/95  ?Pulse: 86 85  ?Resp: 20 17  ?Temp: 98.4 ?F (36.9 ?C) 98.8 ?F (37.1 ?C)  ?SpO2: 98% 98%  ? ? ?General: Awake, no distress.  ?CV:  Good peripheral perfusion.  Regular rate and rhythm  ?Resp:  Normal effort.  Equal breath sounds bilaterally.  ?Abd:  No distention.  Soft, nontender.  No rebound or guarding.  Patient does have a right nephrostomy tube in place.  No erythema or signs of infection externally. ? ? ? ?ED Results / Procedures / Treatments  ? ?RADIOLOGY ? ?I personally reviewed the CT images right nephrostomy tube appears to be in the appropriate place. ?CT shows possible right colitis otherwise no significant abnormality.   Patient does have diffuse bladder wall thickening. ? ? ?MEDICATIONS ORDERED IN ED: ?Medications - No data to display ? ? ?IMPRESSION / MDM / ASSESSMENT AND PLAN / ED COURSE  ?I reviewed the triage vital signs and the nursing notes. ? ?Patient presents emergency department for right flank pain at the site of his nephrostomy tube.  No obvious signs of infection externally on my examination.  Nontender to palpation to this area.  We will check labs, urinalysis and obtain the CT scan without contrast to further evaluate.  Patient agreeable to plan. ? ?CT scan shows appropriate place right nephrostomy tube.  Does have bladder wall thickening and possible colitis.  Denies any GI symptoms such as nausea vomiting or diarrhea.  Patient is a dialysis patient had dialysis on Friday is scheduled for dialysis tomorrow morning.  Patient's lab work shows normal white blood cell count overall baseline CBC.  Chemistry shows slight hyperkalemia at 5.3 and a BUN of 90.  I spoke to Dr. Candiss Norse of nephrology who believes the patient would be safe for discharge home with dialysis in the morning.  Patient's  urinalysis has resulted showing significant urinary tract infection.  We will dose IM Rocephin and send the patient home on Keflex.  Dr. Candiss Norse states he will follow-up with the patient's dialysis center so they can follow-up on the urine culture and they can convert to IV therapy if needed.  Patient is agreeable to plan.  Patient has no complaints right now.  Is asking for something to eat and drink.  Nontoxic in appearance. ? ?FINAL CLINICAL IMPRESSION(S) / ED DIAGNOSES  ? ?Right flank pain ?Urinary tract infection ? ?Rx / DC Orders  ? ?Antibiotics ?Dialysis in the morning ? ?Note:  This document was prepared using Dragon voice recognition software and may include unintentional dictation errors. ?  ?Harvest Dark, MD ?08/14/21 1924 ? ?

## 2021-08-14 NOTE — ED Notes (Addendum)
Pt given a sandwich tray and two apple juices ?

## 2021-08-14 NOTE — ED Notes (Signed)
Patient's INT has been discontinued. Patient has requested additional apple juices and more Lays chips after he has eaten one tray and had four juices and crackers. More beverages and crackers were provided. ?

## 2021-08-16 LAB — URINE CULTURE

## 2021-08-22 ENCOUNTER — Emergency Department
Admission: EM | Admit: 2021-08-22 | Discharge: 2021-08-22 | Disposition: A | Discharge status: Home / Self Care | Payer: Medicaid Other | Attending: Emergency Medicine | Admitting: Emergency Medicine

## 2021-08-22 ENCOUNTER — Emergency Department: Payer: Medicaid Other

## 2021-08-22 ENCOUNTER — Other Ambulatory Visit: Payer: Self-pay

## 2021-08-22 DIAGNOSIS — W050XXA Fall from non-moving wheelchair, initial encounter: Secondary | ICD-10-CM | POA: Diagnosis not present

## 2021-08-22 DIAGNOSIS — I10 Essential (primary) hypertension: Secondary | ICD-10-CM | POA: Insufficient documentation

## 2021-08-22 DIAGNOSIS — S40011A Contusion of right shoulder, initial encounter: Secondary | ICD-10-CM | POA: Diagnosis not present

## 2021-08-22 DIAGNOSIS — S4991XA Unspecified injury of right shoulder and upper arm, initial encounter: Secondary | ICD-10-CM | POA: Diagnosis present

## 2021-08-22 NOTE — Discharge Instructions (Addendum)
Follow-up with your primary care provider if any continued problems or concerns.  You may use ice to your right shoulder as needed for discomfort.  You may take Tylenol if needed for pain.  Continue with your regular medications as prescribed by your doctor. ?

## 2021-08-22 NOTE — ED Notes (Signed)
Ems here to take pt home  pt alert.   ?

## 2021-08-22 NOTE — ED Notes (Signed)
Arrived by ems with c/o nephrostomy tube pain.  Is a dialysis patient.  Fell transferring from chair to wheelchair.  A O.  Did not go to dialysis today.  Lives alone in apartment.   ?

## 2021-08-22 NOTE — ED Provider Notes (Signed)
? ?[facility] ?Provider Note ? ? ? Event Date/Time  ? First MD Initiated Contact with Patient 08/22/21 1211   ?  (approximate) ? ? ?History  ? ?Fall ? ? ?HPI ? ?Larry Morrison is a 51 y.o. male   presents to the ED with complaint of right shoulder pain.  Patient states he was sitting in a chair and fell sideways landing on his right shoulder.  He denies any head injury or loss of consciousness.  Patient came by EMS.  He is a dialysis patient also with history of traumatic brain injury, cognitive impairment, hypertension, polysubstance abuse, chronic renal failure, history of left nephrectomy, brain aneurysm, cardiac arrest. ? ?  ? ? ?Physical Exam  ? ?Triage Vital Signs: ?ED Triage Vitals  ?Enc Vitals Group  ?   BP 08/22/21 1019 (!) 169/116  ?   Pulse Rate 08/22/21 1019 97  ?   Resp 08/22/21 1019 17  ?   Temp 08/22/21 1019 (!) 97.5 ?F (36.4 ?C)  ?   Temp Source 08/22/21 1019 Oral  ?   SpO2 08/22/21 1019 98 %  ?   Weight 08/22/21 1020 108 lb (49 kg)  ?   Height 08/22/21 1020 5\' 4"  (1.626 m)  ?   Head Circumference --   ?   Peak Flow --   ?   Pain Score 08/22/21 1020 4  ?   Pain Loc --   ?   Pain Edu? --   ?   Excl. in [place]? --   ? ? ?Most recent vital signs: ?Vitals:  ? 08/22/21 1019  ?BP: (!) 169/116  ?Pulse: 97  ?Resp: 17  ?Temp: (!) 97.5 ?F (36.4 ?C)  ?SpO2: 98%  ? ? ? ?General: Awake, no distress.  Alert and talkative. ?CV:  Good peripheral perfusion.  Heart regular rate and rhythm. ?Resp:  Normal effort.  Lungs are clear bilaterally. ?Abd:  No distention.  ?Other:  On examination of the right shoulder there is no gross deformity however patient complains of pain generalized and decreased range of motion.  No other the areas of his right upper extremity are tender and no deformities are noted. ? ? ?ED Results / Procedures / Treatments  ? ?Labs ?(all labs ordered are listed, but only abnormal results are displayed) ?Labs Reviewed - No data to display ? ? ? ?RADIOLOGY ?X-ray right shoulder  reviewed by myself independently of the radiologist is negative for acute fracture.  Radiology report is negative for acute fracture. ? ? ? ?PROCEDURES: ? ?Critical Care performed:  ? ?Procedures ? ? ?MEDICATIONS ORDERED IN ED: ?Medications - No data to display ? ? ?IMPRESSION / MDM / ASSESSMENT AND PLAN / ED COURSE  ?I reviewed the triage vital signs and the nursing notes. ? ? ?Differential diagnosis includes, but is not limited to, fracture right shoulder, contusion right shoulder, dislocation right shoulder. ? ?51 year old male presents to the ED after he fell from a chair and landed on his right shoulder.  Patient denies any other injuries.  On physical exam there is no gross deformity and on x-ray no acute fracture was noted.  Patient was made aware and we discussed the findings of his x-rays.  Patient is to follow-up with his primary care provider if any continued problems or concerns.  Ice to his shoulder as needed for discomfort and continue with his regular medications. ? ? ? ?  ? ? ?FINAL CLINICAL IMPRESSION(S) / ED DIAGNOSES  ? ?Final diagnoses:  ?  Contusion of right shoulder, initial encounter  ? ? ? ?Rx / DC Orders  ? ?ED Discharge Orders   ? ? None  ? ?  ? ? ? ?Note:  This document was prepared using Dragon voice recognition software and may include unintentional dictation errors. ?  ?Johnn Hai, PA-C ?08/22/21 1457 ? ?  ?Carrie Mew, MD ?08/22/21 1549 ? ?

## 2021-08-22 NOTE — ED Notes (Signed)
See triage note   states he fell   hitting his right arm/shoulder   he was transferring from bed to chair   ?

## 2021-08-23 ENCOUNTER — Telehealth (INDEPENDENT_AMBULATORY_CARE_PROVIDER_SITE_OTHER): Payer: Self-pay

## 2021-08-23 NOTE — Telephone Encounter (Signed)
Will at [facility] called wanting to know where the patient has to go for his surgery and the time. I gave the address and where he should go as well as having to call [facility] the day before between 1-3 pm to get his arrival time to the MM. ?

## 2021-08-24 ENCOUNTER — Other Ambulatory Visit (INDEPENDENT_AMBULATORY_CARE_PROVIDER_SITE_OTHER): Payer: Self-pay | Admitting: Nurse Practitioner

## 2021-08-24 ENCOUNTER — Inpatient Hospital Stay
Admission: RE | Admit: 2021-08-24 | Discharge: 2021-08-24 | Disposition: A | Discharge status: Home / Self Care | Payer: Medicaid Other | Source: Ambulatory Visit

## 2021-08-24 ENCOUNTER — Encounter: Payer: Self-pay | Admitting: Urgent Care

## 2021-08-24 DIAGNOSIS — N186 End stage renal disease: Secondary | ICD-10-CM

## 2021-08-24 NOTE — Patient Instructions (Addendum)
Your procedure is scheduled on: Wednesday, April 12 ?Report to the Registration Desk on the 1st floor of the [facility]. ?To find out your arrival time, please call [phone] between 1PM - 3PM on: Tuesday, April 11 ? ?REMEMBER: ?Instructions that are not followed completely may result in serious medical risk, up to and including death; or upon the discretion of your surgeon and anesthesiologist your surgery may need to be rescheduled. ? ?Do not eat or drink after midnight the night before surgery.  ?No gum chewing, lozengers or hard candies. ? ?TAKE THESE MEDICATIONS THE MORNING OF SURGERY WITH A SIP OF WATER: ? ?Carvedilol ? ?One week prior to surgery: ?Stop Anti-inflammatories (NSAIDS) such as Advil, Aleve, Ibuprofen, Motrin, Naproxen, Naprosyn and Aspirin based products such as Excedrin, Goodys Powder, BC Powder. ?Stop ANY OVER THE COUNTER supplements until after surgery. ?You may however, continue to take Tylenol if needed for pain up until the day of surgery. ? ?No Alcohol for 24 hours before or after surgery. ? ?No Smoking including e-cigarettes for 24 hours prior to surgery.  ?No chewable tobacco products for at least 6 hours prior to surgery.  ?No nicotine patches on the day of surgery. ? ?Do not use any "recreational" drugs for at least a week prior to your surgery.  ?Please be advised that the combination of cocaine and anesthesia may have negative outcomes, up to and including death. ?If you test positive for cocaine, your surgery will be cancelled. ? ?On the morning of surgery brush your teeth with toothpaste and water, you may rinse your mouth with mouthwash if you wish. ?Do not swallow any toothpaste or mouthwash. ? ?Use CHG Soap or wipes as directed on instruction sheet. ? ?Do not wear jewelry, make-up, hairpins, clips or nail polish. ? ?Do not wear lotions, powders, or perfumes.  ? ?Do not shave body from the neck down 48 hours prior to surgery just in case you cut yourself which could leave a  site for infection.  ?Also, freshly shaved skin may become irritated if using the CHG soap. ? ?Contact lenses, hearing aids and dentures may not be worn into surgery. ? ?Do not bring valuables to the hospital. [facility] is not responsible for any missing/lost belongings or valuables.  ? ?Bring your C-PAP to the hospital with you in case you may have to spend the night.  ? ?Notify your doctor if there is any change in your medical condition (cold, fever, infection). ? ?Wear comfortable clothing (specific to your surgery type) to the hospital. ? ?After surgery, you can help prevent lung complications by doing breathing exercises.  ?Take deep breaths and cough every 1-2 hours. Your doctor may order a device called an Incentive Spirometer to help you take deep breaths. ? ?If you are being discharged the day of surgery, you will not be allowed to drive home. ?You will need a responsible adult (18 years or older) to drive you home and stay with you that night.  ? ?If you are taking public transportation, you will need to have a responsible adult (18 years or older) with you. ?Please confirm with your physician that it is acceptable to use public transportation.  ? ?Please call the [place] Dept. at [phone] if you have any questions about these instructions. ? ?Surgery Visitation Policy: ? ?Patients undergoing a surgery or procedure may have two family members or support persons with them as long as the person is not COVID-19 positive or experiencing its symptoms.  ?

## 2021-08-24 NOTE — Progress Notes (Signed)
?Perioperative Services ? ?Pre-Admission/Anesthesia Testing Clinical Review ? ?Date: 08/24/21 ? ?Patient Demographics:  ?Name: Larry Morrison ?DOB:   05-04-1971 ?MRN:   [id number] ? ?Planned Surgical Procedure(s):  ? ? Case: [id number] Date/Time: 08/31/21 0715  ? Procedure: INSERTION OF ARTERIOVENOUS (AV) GORE-TEX GRAFT ARM (BRACHIAL AXILLARY) (Left)  ? Anesthesia type: General  ? Pre-op diagnosis: ESRD  ? Location: [facility] OR ROOM 08 / [facility] FOR ANEST[facility]  ? Surgeons: Katha Cabal, MD  ? ?NOTE: Available PAT nursing documentation and vital signs have been reviewed. Clinical nursing staff has updated patient's PMH/PSHx, current medication list, and drug allergies/intolerances to ensure comprehensive history available to assist in medical decision making as it pertains to the aforementioned surgical procedure and anticipated anesthetic course. Extensive review of available clinical information performed. [facility] PMH and PSHx updated with any diagnoses/procedures that  may have been inadvertently omitted during his intake with the pre-admission testing department's nursing staff. ? ?Clinical Discussion:  ?Larry Morrison is a 51 y.o. male who is submitted for pre-surgical anesthesia review and clearance prior to him undergoing the above procedure. Patient is a Current Smoker. 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 ? ?Patient seen during hospital admission by cardiology on 03/02/2021. MD noted that patient with a significant cardiomyopathy and risk factors for CAD. Cardiology APP saw patient and  suggested adding ARB/ANRi Delene Loll), SGLT2i, and diuretic (spironolactone) to his daily regimen and planned for further ischemic workup. With that being said, patient declined ischemic workup citing that he did not wish to proceed with further cardiovascular services and/or testing at [facility]. Patient noted that he received the majority of his care at [facility], therefore he wished to follow up there. In review of the available medical records, I do not see any previous cardiovascular testing performed at [facility]. It seems as if the majority of patient's care has been related to his ESRD and subsequent dialysis. Follow up assessment by cardiologist Garen Lah, MD) encouraged patient to comply with treatment recommendations, however patient advised that he planned to be seen at [facility]. He was started on low dose beta blocker (carvedilol) and ARB (losartan) therapy for his HTN. Cardiology service signed off with plans for patient to follow up with medical team at [facility].  ? ?When discussing upcoming procedure, and need for cardiac clearance prior to proceeding, patient agreed to establish care with local cardiologist. Patient seen in cardiology clinic on 08/09/2021 by Dr. Aaron Edelman Agbor-Etang,MD; notes reviewed.  At the time of his clinic visit, patient denied any episodes of chest pain, however had chronic episodes of shortness of breath.  Patient continues to smoke, however verbalized that he was working on quitting.  Patient denied any PND, orthopnea, palpitations, significant peripheral edema, vertiginous symptoms, or presyncope/syncope.  Patient wheelchair-bound.  Previously on beta-blocker therapy, however patient has stopped taking all of his medications citing the fact that they "make him sick".  Patient with a past medical history significant for cardiovascular diagnoses. ? ?Patient has a history of a remote CVA; details limited.  ? ?TTE performed on 03/01/2021 revealed a severely decreased left ventricular systolic function  with an EF of less than 20%.  There was global hypokinesis.  Diastolic Doppler parameters consistent with pseudonormalization (G2DD).  GLS -4.3%.  Right ventricle mildly enlarged with mildly elevated PASP.  There was moderate LEFT and mild RIGHT atrial enlargement.  There was mild to moderate mitral valve regurgitation. There was moderate to severe tricuspid valve regurgitation.  There was no evidence of a significant transvalvular gradient to suggest mitral or aortic valve stenosis. ? ?On 04/11/2021, patient receiving inpatient hemodialysis treatment.  1 hour into his standard treatment, patient became unresponsive.  CODE BLUE was called.  Patient was placed on the monitor and found to be in PEA arrest.  He received CPR and several rounds of ACLS interventions prior to achieving ROSC.  Patient was transferred to the ICU for ongoing care and management.  Pulmonology and critical care medicine was consulted.  Full cardiopulmonary arrest (PEA arrest) felt to be secondary to volume overload and hyperkalemia (K+ 6.2 mmol/L).  Of note, patient with history of noncompliance with hemodialysis schedule.  It is noted the patient had refused multiple hemodialysis treatments around that time.  ? ?Repeat TTE performed on 04/12/2021 revealing a severely reduced left ventricular systolic function with an EF of less than 20%.  Left ventricle demonstrated global hypokinesis.  Diastolic Doppler parameters consistent with pseudonormalization (G2DD).  There was mild pulmonary hypertension.  RV SF moderately reduced.  There was mild to moderate mitral and moderate to severe tricuspid valve regurgitation.  There was no evidence of valvular stenosis. ? ?Blood pressure uncontrolled at 158/110 on currently prescribed beta blocker monotherapy. Patient not taking the prescribed ARB (losartan). He is not currently taking any type of lipid lower therapies for ASCVD prevention. He is not diabetic. Following CVA, patient is wheelchair bound and  requires assistance with most of his ADL/IADLs (can feed self). Patient with sacral decubitus ulcers and has a history of fecal incontinence. He has a foley catheter in place secondary to a neurogenic bladder. Functional capacity felt to be </= 4 METS.  Patient was restarted on Coreg 12.5 mg twice daily and irbesartan 150 mg daily.  No other changes were made to his medication regimen.  Patient to follow-up with outpatient cardiology in 6 to 8 weeks or sooner if needed. ? ?Hassaan Crite is scheduled for an INSERTION OF LEFT ARTERIOVENOUS GORE-TEX GRAFT on 06/24/2021 with Dr. [name], MD. Given patient's past medical history significant for cardiovascular diagnoses, presurgical cardiac clearance was sought by the PAT team.  Per cardiology, "last EF less than 20%.  Exercise capacity cannot be estimated due to patient being wheelchair-bound.  With EF less than 20%, obviously patient is high risk from a cardiac perspective.  However, he needs dialysis in order to live.  He appears euvolemic.  I definitely think proceeding with AV graft procedure makes rational sense since patient cannot survive without dialysis.  Okay to proceed with procedure from a cardiac perspective after weighing all risk and benefits".  This patient is on daily antiplatelet therapy. In review of Dr. Nino Parsley standing surgical protocol, it is acceptable for him to continue his daily low dose ASA throughout the perioperative course.  ? ?Patient denies previous perioperative complications with anesthesia in the past. In review of the available records, it is noted that patient underwent a MAC anesthetic course here (ASA IV) in 11/2019 without documented complications.  ? ? ?  08/22/2021  ?  3:01 PM 08/22/2021  ? 10:20 AM 08/22/2021  ? 10:19 AM  ?Vitals with BMI  ?Height  _0    ?Weight  108 lbs   ?BMI  18.53   ?Systolic 213  086  ?Diastolic 578  116  ?Pulse 88  97  ? ? ?Providers/Specialists:  ? ?NOTE: Primary physician provider listed below.  Patient may have been seen by APP or partner within same practice.  ? ?PROVIDER ROLE / SPECIALTY LAST OV  ?Schnier, Dolores Lory, MD Vascular Surgery (Surgeon) 06/09/2021  ?Physicians, [place] Primary Care Prov

## 2021-08-24 NOTE — Pre-Procedure Instructions (Signed)
Multiple phone calls back to back today for pre-op interview. Unable to contact the patient. Call to mother and the dialysis center. Will at dialysis center stated that the patient did not show up for dialysis today. Patient had recent ER visits at [facility]. ?08/14/21 UTI ?08/22/21 shoulder injury from fall ?Surgery scheduled for next Wednesday, April 12. If he is unable to be contacted and unable to come in for pre-op testing, he will need to arrive early on the day of surgery to complete all required testing. ? ?

## 2021-08-29 ENCOUNTER — Emergency Department: Payer: Medicaid Other

## 2021-08-29 ENCOUNTER — Encounter: Payer: Self-pay | Admitting: Emergency Medicine

## 2021-08-29 ENCOUNTER — Other Ambulatory Visit: Payer: Self-pay

## 2021-08-29 DIAGNOSIS — I132 Hypertensive heart and chronic kidney disease with heart failure and with stage 5 chronic kidney disease, or end stage renal disease: Secondary | ICD-10-CM | POA: Diagnosis present

## 2021-08-29 DIAGNOSIS — Z881 Allergy status to other antibiotic agents status: Secondary | ICD-10-CM

## 2021-08-29 DIAGNOSIS — Z993 Dependence on wheelchair: Secondary | ICD-10-CM

## 2021-08-29 DIAGNOSIS — Z7982 Long term (current) use of aspirin: Secondary | ICD-10-CM

## 2021-08-29 DIAGNOSIS — I5042 Chronic combined systolic (congestive) and diastolic (congestive) heart failure: Secondary | ICD-10-CM | POA: Diagnosis present

## 2021-08-29 DIAGNOSIS — Z79899 Other long term (current) drug therapy: Secondary | ICD-10-CM

## 2021-08-29 DIAGNOSIS — E872 Acidosis, unspecified: Secondary | ICD-10-CM | POA: Diagnosis present

## 2021-08-29 DIAGNOSIS — Y833 Surgical operation with formation of external stoma as the cause of abnormal reaction of the patient, or of later complication, without mention of misadventure at the time of the procedure: Secondary | ICD-10-CM | POA: Diagnosis present

## 2021-08-29 DIAGNOSIS — Z6820 Body mass index (BMI) 20.0-20.9, adult: Secondary | ICD-10-CM

## 2021-08-29 DIAGNOSIS — Z936 Other artificial openings of urinary tract status: Secondary | ICD-10-CM

## 2021-08-29 DIAGNOSIS — Z992 Dependence on renal dialysis: Secondary | ICD-10-CM

## 2021-08-29 DIAGNOSIS — D631 Anemia in chronic kidney disease: Secondary | ICD-10-CM | POA: Diagnosis present

## 2021-08-29 DIAGNOSIS — N261 Atrophy of kidney (terminal): Secondary | ICD-10-CM | POA: Diagnosis present

## 2021-08-29 DIAGNOSIS — Z8614 Personal history of Methicillin resistant Staphylococcus aureus infection: Secondary | ICD-10-CM

## 2021-08-29 DIAGNOSIS — N390 Urinary tract infection, site not specified: Secondary | ICD-10-CM | POA: Diagnosis present

## 2021-08-29 DIAGNOSIS — Z20822 Contact with and (suspected) exposure to covid-19: Secondary | ICD-10-CM | POA: Diagnosis present

## 2021-08-29 DIAGNOSIS — N2581 Secondary hyperparathyroidism of renal origin: Secondary | ICD-10-CM | POA: Diagnosis present

## 2021-08-29 DIAGNOSIS — T83512A Infection and inflammatory reaction due to nephrostomy catheter, initial encounter: Principal | ICD-10-CM | POA: Diagnosis present

## 2021-08-29 DIAGNOSIS — N139 Obstructive and reflux uropathy, unspecified: Secondary | ICD-10-CM | POA: Diagnosis present

## 2021-08-29 DIAGNOSIS — R9431 Abnormal electrocardiogram [ECG] [EKG]: Secondary | ICD-10-CM | POA: Diagnosis present

## 2021-08-29 DIAGNOSIS — F17210 Nicotine dependence, cigarettes, uncomplicated: Secondary | ICD-10-CM | POA: Diagnosis present

## 2021-08-29 DIAGNOSIS — N186 End stage renal disease: Secondary | ICD-10-CM | POA: Diagnosis present

## 2021-08-29 DIAGNOSIS — Z87440 Personal history of urinary (tract) infections: Secondary | ICD-10-CM

## 2021-08-29 DIAGNOSIS — Z8249 Family history of ischemic heart disease and other diseases of the circulatory system: Secondary | ICD-10-CM

## 2021-08-29 DIAGNOSIS — Z91158 Patient's noncompliance with renal dialysis for other reason: Secondary | ICD-10-CM

## 2021-08-29 DIAGNOSIS — N319 Neuromuscular dysfunction of bladder, unspecified: Secondary | ICD-10-CM | POA: Diagnosis present

## 2021-08-29 DIAGNOSIS — Z8673 Personal history of transient ischemic attack (TIA), and cerebral infarction without residual deficits: Secondary | ICD-10-CM

## 2021-08-29 DIAGNOSIS — I272 Pulmonary hypertension, unspecified: Secondary | ICD-10-CM | POA: Diagnosis present

## 2021-08-29 DIAGNOSIS — Y95 Nosocomial condition: Secondary | ICD-10-CM | POA: Diagnosis present

## 2021-08-29 DIAGNOSIS — J189 Pneumonia, unspecified organism: Secondary | ICD-10-CM | POA: Diagnosis present

## 2021-08-29 DIAGNOSIS — Z8674 Personal history of sudden cardiac arrest: Secondary | ICD-10-CM

## 2021-08-29 DIAGNOSIS — Z905 Acquired absence of kidney: Secondary | ICD-10-CM

## 2021-08-29 DIAGNOSIS — Z8616 Personal history of COVID-19: Secondary | ICD-10-CM

## 2021-08-29 DIAGNOSIS — E43 Unspecified severe protein-calorie malnutrition: Secondary | ICD-10-CM | POA: Diagnosis present

## 2021-08-29 DIAGNOSIS — I429 Cardiomyopathy, unspecified: Secondary | ICD-10-CM | POA: Diagnosis present

## 2021-08-29 NOTE — ED Triage Notes (Signed)
EMS brings pt in from home for c/o weakness, nonprod cough today as well; pt dialysis Mon-Wed-Fri (missed today because he "overslept"); pt denies any other c/o at this time ?

## 2021-08-30 ENCOUNTER — Emergency Department: Payer: Medicaid Other

## 2021-08-30 ENCOUNTER — Inpatient Hospital Stay
Admission: EM | Admit: 2021-08-30 | Discharge: 2021-09-03 | DRG: 698 | Disposition: A | Discharge status: Home / Self Care | Payer: Medicaid Other | Attending: Internal Medicine | Admitting: Internal Medicine

## 2021-08-30 DIAGNOSIS — Z8673 Personal history of transient ischemic attack (TIA), and cerebral infarction without residual deficits: Secondary | ICD-10-CM | POA: Diagnosis not present

## 2021-08-30 DIAGNOSIS — R531 Weakness: Secondary | ICD-10-CM

## 2021-08-30 DIAGNOSIS — N139 Obstructive and reflux uropathy, unspecified: Secondary | ICD-10-CM | POA: Diagnosis present

## 2021-08-30 DIAGNOSIS — N186 End stage renal disease: Secondary | ICD-10-CM | POA: Diagnosis present

## 2021-08-30 DIAGNOSIS — R9431 Abnormal electrocardiogram [ECG] [EKG]: Secondary | ICD-10-CM

## 2021-08-30 DIAGNOSIS — Z936 Other artificial openings of urinary tract status: Secondary | ICD-10-CM | POA: Diagnosis not present

## 2021-08-30 DIAGNOSIS — Z992 Dependence on renal dialysis: Secondary | ICD-10-CM | POA: Diagnosis not present

## 2021-08-30 DIAGNOSIS — N390 Urinary tract infection, site not specified: Secondary | ICD-10-CM | POA: Diagnosis present

## 2021-08-30 DIAGNOSIS — I272 Pulmonary hypertension, unspecified: Secondary | ICD-10-CM | POA: Diagnosis present

## 2021-08-30 DIAGNOSIS — Z8616 Personal history of COVID-19: Secondary | ICD-10-CM | POA: Diagnosis not present

## 2021-08-30 DIAGNOSIS — N3000 Acute cystitis without hematuria: Secondary | ICD-10-CM | POA: Diagnosis not present

## 2021-08-30 DIAGNOSIS — Y95 Nosocomial condition: Secondary | ICD-10-CM | POA: Diagnosis present

## 2021-08-30 DIAGNOSIS — Z8614 Personal history of Methicillin resistant Staphylococcus aureus infection: Secondary | ICD-10-CM | POA: Diagnosis not present

## 2021-08-30 DIAGNOSIS — J189 Pneumonia, unspecified organism: Secondary | ICD-10-CM | POA: Diagnosis present

## 2021-08-30 DIAGNOSIS — N319 Neuromuscular dysfunction of bladder, unspecified: Secondary | ICD-10-CM | POA: Diagnosis present

## 2021-08-30 DIAGNOSIS — Z993 Dependence on wheelchair: Secondary | ICD-10-CM | POA: Diagnosis not present

## 2021-08-30 DIAGNOSIS — Z20822 Contact with and (suspected) exposure to covid-19: Secondary | ICD-10-CM | POA: Diagnosis present

## 2021-08-30 DIAGNOSIS — E43 Unspecified severe protein-calorie malnutrition: Secondary | ICD-10-CM | POA: Diagnosis present

## 2021-08-30 DIAGNOSIS — J90 Pleural effusion, not elsewhere classified: Secondary | ICD-10-CM | POA: Diagnosis not present

## 2021-08-30 DIAGNOSIS — I5042 Chronic combined systolic (congestive) and diastolic (congestive) heart failure: Secondary | ICD-10-CM | POA: Diagnosis present

## 2021-08-30 DIAGNOSIS — N261 Atrophy of kidney (terminal): Secondary | ICD-10-CM | POA: Diagnosis present

## 2021-08-30 DIAGNOSIS — I10 Essential (primary) hypertension: Secondary | ICD-10-CM | POA: Diagnosis present

## 2021-08-30 DIAGNOSIS — I132 Hypertensive heart and chronic kidney disease with heart failure and with stage 5 chronic kidney disease, or end stage renal disease: Secondary | ICD-10-CM | POA: Diagnosis present

## 2021-08-30 DIAGNOSIS — E872 Acidosis, unspecified: Secondary | ICD-10-CM | POA: Diagnosis present

## 2021-08-30 DIAGNOSIS — E46 Unspecified protein-calorie malnutrition: Secondary | ICD-10-CM | POA: Diagnosis present

## 2021-08-30 DIAGNOSIS — Z01812 Encounter for preprocedural laboratory examination: Secondary | ICD-10-CM

## 2021-08-30 DIAGNOSIS — I429 Cardiomyopathy, unspecified: Secondary | ICD-10-CM | POA: Diagnosis present

## 2021-08-30 DIAGNOSIS — Z87440 Personal history of urinary (tract) infections: Secondary | ICD-10-CM | POA: Diagnosis not present

## 2021-08-30 DIAGNOSIS — D631 Anemia in chronic kidney disease: Secondary | ICD-10-CM | POA: Diagnosis present

## 2021-08-30 DIAGNOSIS — N2581 Secondary hyperparathyroidism of renal origin: Secondary | ICD-10-CM | POA: Diagnosis present

## 2021-08-30 DIAGNOSIS — T83512A Infection and inflammatory reaction due to nephrostomy catheter, initial encounter: Secondary | ICD-10-CM | POA: Diagnosis present

## 2021-08-30 DIAGNOSIS — Y833 Surgical operation with formation of external stoma as the cause of abnormal reaction of the patient, or of later complication, without mention of misadventure at the time of the procedure: Secondary | ICD-10-CM | POA: Diagnosis present

## 2021-08-30 LAB — COMPREHENSIVE METABOLIC PANEL
ALT: 26 U/L (ref 0–44)
AST: 30 U/L (ref 15–41)
Albumin: 2.6 g/dL — ABNORMAL LOW (ref 3.5–5.0)
Alkaline Phosphatase: 131 U/L — ABNORMAL HIGH (ref 38–126)
Anion gap: 14 (ref 5–15)
BUN: 74 mg/dL — ABNORMAL HIGH (ref 6–20)
CO2: 21 mmol/L — ABNORMAL LOW (ref 22–32)
Calcium: 8 mg/dL — ABNORMAL LOW (ref 8.9–10.3)
Chloride: 105 mmol/L (ref 98–111)
Creatinine, Ser: 9.61 mg/dL — ABNORMAL HIGH (ref 0.61–1.24)
GFR, Estimated: 6 mL/min — ABNORMAL LOW (ref >60)
Glucose, Bld: 86 mg/dL (ref 70–99)
Potassium: 5 mmol/L (ref 3.5–5.1)
Sodium: 140 mmol/L (ref 135–145)
Total Bilirubin: 0.9 mg/dL (ref 0.3–1.2)
Total Protein: 8.4 g/dL — ABNORMAL HIGH (ref 6.5–8.1)

## 2021-08-30 LAB — URINALYSIS, ROUTINE W REFLEX MICROSCOPIC
Bacteria, UA: NONE SEEN
Bilirubin Urine: NEGATIVE
Glucose, UA: 50 mg/dL — AB
Ketones, ur: NEGATIVE mg/dL
Nitrite: NEGATIVE
Protein, ur: 100 mg/dL — AB
RBC / HPF: >50 RBC/hpf — ABNORMAL HIGH (ref 0–5)
Specific Gravity, Urine: 1.026 (ref 1.005–1.030)
WBC, UA: >50 WBC/hpf — ABNORMAL HIGH (ref 0–5)
pH: 7 (ref 5.0–8.0)

## 2021-08-30 LAB — CBC WITH DIFFERENTIAL/PLATELET
Abs Immature Granulocytes: 0.02 10*3/uL (ref 0.00–0.07)
Basophils Absolute: 0 10*3/uL (ref 0.0–0.1)
Basophils Relative: 1 %
Eosinophils Absolute: 0.2 10*3/uL (ref 0.0–0.5)
Eosinophils Relative: 3 %
HCT: 37.1 % — ABNORMAL LOW (ref 39.0–52.0)
Hemoglobin: 10.9 g/dL — ABNORMAL LOW (ref 13.0–17.0)
Immature Granulocytes: 0 %
Lymphocytes Relative: 16 %
Lymphs Abs: 1 10*3/uL (ref 0.7–4.0)
MCH: 29.5 pg (ref 26.0–34.0)
MCHC: 29.4 g/dL — ABNORMAL LOW (ref 30.0–36.0)
MCV: 100.3 fL — ABNORMAL HIGH (ref 80.0–100.0)
Monocytes Absolute: 0.4 10*3/uL (ref 0.1–1.0)
Monocytes Relative: 6 %
Neutro Abs: 4.5 10*3/uL (ref 1.7–7.7)
Neutrophils Relative %: 74 %
Platelets: 174 10*3/uL (ref 150–400)
RBC: 3.7 MIL/uL — ABNORMAL LOW (ref 4.22–5.81)
RDW: 16.2 % — ABNORMAL HIGH (ref 11.5–15.5)
WBC: 6.1 10*3/uL (ref 4.0–10.5)
nRBC: 0 % (ref 0.0–0.2)

## 2021-08-30 LAB — CBC
HCT: 36.8 % — ABNORMAL LOW (ref 39.0–52.0)
Hemoglobin: 10.7 g/dL — ABNORMAL LOW (ref 13.0–17.0)
MCH: 29.9 pg (ref 26.0–34.0)
MCHC: 29.1 g/dL — ABNORMAL LOW (ref 30.0–36.0)
MCV: 102.8 fL — ABNORMAL HIGH (ref 80.0–100.0)
Platelets: 156 10*3/uL (ref 150–400)
RBC: 3.58 MIL/uL — ABNORMAL LOW (ref 4.22–5.81)
RDW: 16.6 % — ABNORMAL HIGH (ref 11.5–15.5)
WBC: 6.7 10*3/uL (ref 4.0–10.5)
nRBC: 0 % (ref 0.0–0.2)

## 2021-08-30 LAB — PROCALCITONIN: Procalcitonin: 1.92 ng/mL

## 2021-08-30 LAB — LACTIC ACID, PLASMA
Lactic Acid, Venous: 1.9 mmol/L (ref 0.5–1.9)
Lactic Acid, Venous: 2.1 mmol/L (ref 0.5–1.9)

## 2021-08-30 LAB — RESP PANEL BY RT-PCR (FLU A&B, COVID) ARPGX2
Influenza A by PCR: NEGATIVE
Influenza B by PCR: NEGATIVE
SARS Coronavirus 2 by RT PCR: NEGATIVE

## 2021-08-30 LAB — MAGNESIUM: Magnesium: 2 mg/dL (ref 1.7–2.4)

## 2021-08-30 LAB — TROPONIN I (HIGH SENSITIVITY)
Troponin I (High Sensitivity): 78 ng/L — ABNORMAL HIGH (ref <18)
Troponin I (High Sensitivity): 80 ng/L — ABNORMAL HIGH (ref <18)

## 2021-08-30 MED ORDER — HEPARIN SODIUM (PORCINE) 5000 UNIT/ML IJ SOLN
5000.0000 [IU] | Freq: Three times a day (TID) | INTRAMUSCULAR | Status: DC
  Filled 2021-08-30 (×8): qty 1

## 2021-08-30 MED ORDER — VANCOMYCIN HCL 1250 MG/250ML IV SOLN
1250.0000 mg | Freq: Once | INTRAVENOUS | Status: AC
  Administered 2021-08-30: 1250 mg via INTRAVENOUS
  Filled 2021-08-30: qty 250

## 2021-08-30 MED ORDER — IRBESARTAN 150 MG PO TABS
150.0000 mg | ORAL_TABLET | Freq: Every day | ORAL | Status: DC
  Administered 2021-08-30 – 2021-09-03 (×5): 150 mg via ORAL
  Filled 2021-08-30 (×5): qty 1

## 2021-08-30 MED ORDER — CHLORHEXIDINE GLUCONATE CLOTH 2 % EX PADS: 6.0000 | MEDICATED_PAD | Freq: Once | CUTANEOUS | Status: DC

## 2021-08-30 MED ORDER — IOHEXOL 300 MG/ML  SOLN
80.0000 mL | Freq: Once | INTRAMUSCULAR | Status: AC | PRN
  Administered 2021-08-30: 80 mL via INTRAVENOUS

## 2021-08-30 MED ORDER — SODIUM CHLORIDE 0.9 % IV SOLN: 500.0000 mg | Freq: Once | INTRAVENOUS | Status: DC

## 2021-08-30 MED ORDER — FAMOTIDINE 20 MG PO TABS
20.0000 mg | ORAL_TABLET | Freq: Once | ORAL | Status: DC
Start: 2021-08-30 — End: 2021-08-31

## 2021-08-30 MED ORDER — CHLORHEXIDINE GLUCONATE 0.12 % MT SOLN: 15.0000 mL | Freq: Once | OROMUCOSAL | Status: DC

## 2021-08-30 MED ORDER — CARVEDILOL 6.25 MG PO TABS: 12.5000 mg | ORAL_TABLET | Freq: Two times a day (BID) | ORAL | Status: DC

## 2021-08-30 MED ORDER — CARVEDILOL 12.5 MG PO TABS
12.5000 mg | ORAL_TABLET | Freq: Two times a day (BID) | ORAL | Status: DC
  Administered 2021-08-30 – 2021-09-03 (×8): 12.5 mg via ORAL
  Filled 2021-08-30: qty 2
  Filled 2021-08-30: qty 1
  Filled 2021-08-30: qty 2
  Filled 2021-08-30 (×2): qty 1
  Filled 2021-08-30: qty 2
  Filled 2021-08-30 (×3): qty 1

## 2021-08-30 MED ORDER — SODIUM CHLORIDE 0.9 % IV SOLN: INTRAVENOUS | Status: DC

## 2021-08-30 MED ORDER — ORAL CARE MOUTH RINSE: 15.0000 mL | Freq: Once | OROMUCOSAL | Status: DC

## 2021-08-30 MED ORDER — SODIUM CHLORIDE 0.9 % IV SOLN
1.0000 g | INTRAVENOUS | Status: DC
  Administered 2021-08-31 – 2021-09-01 (×2): 1 g via INTRAVENOUS
  Filled 2021-08-30: qty 10
  Filled 2021-08-30: qty 1

## 2021-08-30 MED ORDER — VANCOMYCIN HCL 500 MG/100ML IV SOLN
500.0000 mg | INTRAVENOUS | Status: DC
  Administered 2021-08-31: 500 mg via INTRAVENOUS
  Filled 2021-08-30: qty 100

## 2021-08-30 MED ORDER — CHLORHEXIDINE GLUCONATE CLOTH 2 % EX PADS
6.0000 | MEDICATED_PAD | Freq: Every day | CUTANEOUS | Status: DC
  Administered 2021-09-01: 6 via TOPICAL

## 2021-08-30 MED ORDER — CEFAZOLIN SODIUM-DEXTROSE 2-4 GM/100ML-% IV SOLN: 2.0000 g | INTRAVENOUS | Status: DC

## 2021-08-30 MED ORDER — SODIUM CHLORIDE 0.9 % IV SOLN
1.0000 g | Freq: Once | INTRAVENOUS | Status: AC
  Administered 2021-08-30: 1 g via INTRAVENOUS
  Filled 2021-08-30: qty 10

## 2021-08-30 NOTE — Progress Notes (Signed)
Pharmacy Antibiotic Note ? ?Larry Morrison is a 51 y.o. male admitted on 08/30/2021 with sepsis.  Pharmacy has been consulted for Vancomycin dosing.  Pt on HD Q MWF.  ? ?Plan: ?Vancomycin 1250 mg IV X 1 ordered for 4/11 @ 0530. ?Vancomycin 500 mg IV every MWF-HD ordered to start on 4/12 @ 1200.  ? ?Height: 5\' 4"  (162.6 cm) ?Weight: 54.4 kg (120 lb) ?IBW/kg (Calculated) : 59.2 ? ?Temp (24hrs), Avg:97.9 ?F (36.6 ?C), Min:97.6 ?F (36.4 ?C), Max:98.2 ?F (36.8 ?C) ? ?Recent Labs  ?Lab 08/29/21 ?2340 08/30/21 ?0256 08/30/21 ?0403  ?WBC 6.1  --   --   ?CREATININE  --   --  9.61*  ?LATICACIDVEN  --  1.9 2.1*  ?  ?Estimated Creatinine Clearance: 6.8 mL/min (A) (by C-G formula based on SCr of 9.61 mg/dL (H)).   ? ?Allergies  ?Allergen Reactions  ? Vancomycin   ?  Patient denies - repeated denial to pharm tech 09-05-2020  ? ? ?Antimicrobials this admission: ?  >>  ?  >>  ? ?Dose adjustments this admission: ? ? ?Microbiology results: ? BCx:  ? UCx:   ? Sputum:   ? MRSA PCR:  ? ?Thank you for allowing pharmacy to be a part of this patient?s care. ? ?Rasheed Welty D ?08/30/2021 5:25 AM ? ?

## 2021-08-30 NOTE — Assessment & Plan Note (Signed)
-   No signs of exacerbation at this time ?- Last echo October 2022: EF less than 17%, grade 2 diastolic dysfunction ?
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
## 2021-08-30 NOTE — Assessment & Plan Note (Signed)
-   History of noncompliance.  Missed 08/29/2021 dialysis session due to his weakness ?-Nephrology aware of hospitalization ?- Continue dialysis plan per nephrology ?

## 2021-08-30 NOTE — Assessment & Plan Note (Signed)
-   admission EKG noted with QTc>520 ?- monitor K and Mg ?- avoiding prolonging agents as able  ?

## 2021-08-30 NOTE — Assessment & Plan Note (Signed)
-   Baseline hemoglobin approximately 9 to 10 g/dL.  Currently at baseline ?

## 2021-08-30 NOTE — TOC Initial Note (Signed)
Transition of Care (TOC) - Initial/Assessment Note  ? ? ?Patient Details  ?Name: Larry Morrison ?MRN: [id number] ?Date of Birth: May 25, 1970 ? ?Transition of Care (TOC) CM/SW Contact:    ?Shelbie Hutching, RN ?Phone Number: ?08/30/2021, 12:04 PM ? ?Clinical Narrative:                 ?Patient being admitted to the hospital with generalized weakness.  RNCm met with patient at the bedside, introduced self and explained role in discharge planning. ?Patient reports he is from home, lives in an apartment.  Patient's brother lives with him and helps him with bathing, cooking, and cleaning.  Patient's brother works at [employer] in [place].   ?Patient goes to dialysis MWF, he uses Medicaid transportation, [place].  He reports he missed his dialysis yesterday because he fell asleep waiting on his transportation and when he woke up they had gone.   ?Patient walks some and uses his wheelchair some, he says he has been doing his own therapy so he does not want to have home health set up he would like to do it himself.   ? ?Patient has a nephrostomy tube that he has been caring for and reports he has had no problems with it.   ?TOC will cont to follow and assess for needs.  ? ?Expected Discharge Plan: Home/Self Care ?Barriers to Discharge: Continued Medical Work up ? ? ?Patient Goals and CMS Choice ?Patient states their goals for this hospitalization and ongoing recovery are:: to get back home ?  ?  ? ?Expected Discharge Plan and Services ?Expected Discharge Plan: Home/Self Care ?  ?Discharge Planning Services: CM Consult ?  ?Living arrangements for the past 2 months: Apartment ?                ?DME Arranged: N/A ?DME Agency: NA ?  ?  ?  ?HH Arranged: Patient Refused HH ?[place] Agency: NA ?  ?  ?  ? ?Prior Living Arrangements/Services ?Living arrangements for the past 2 months: Apartment ?Lives with:: Siblings (brother) ?Patient language and need for interpreter reviewed:: Yes ?Do you feel safe going back to the place where you live?:  Yes      ?Need for Family Participation in Patient Care: Yes (Comment) ?Care giver support system in place?: Yes (comment) (brother) ?Current home services: DME (wheelchair) ?Criminal Activity/Legal Involvement Pertinent to Current Situation/Hospitalization: No - Comment as needed ? ?Activities of Daily Living ?  ?  ? ?Permission Sought/Granted ?Permission sought to share information with : Case Manager, Family Supports ?Permission granted to share information with : Yes, Verbal Permission Granted ? Share Information with NAME: Kunta Hilleary ?   ? Permission granted to share info w Relationship: mother ? Permission granted to share info w Contact Information: [phone] ? ?Emotional Assessment ?Appearance:: Appears older than stated age, Disheveled ?Attitude/Demeanor/Rapport: Engaged ?Affect (typically observed): Accepting ?Orientation: : Oriented to Self, Oriented to Place, Oriented to  Time, Oriented to Situation ?Alcohol / Substance Use: Not Applicable ?Psych Involvement: No (comment) ? ?Admission diagnosis:  Generalized weakness [R53.1] ?Patient Active Problem List  ? Diagnosis Date Noted  ? Generalized weakness 08/30/2021  ? UTI (urinary tract infection) 06/30/2021  ? Chronic combined systolic and diastolic heart failure ([place]) 06/30/2021  ? Fall 06/30/2021  ? ESRD (end stage renal disease) on dialysis [facility])   ? Malfunction of nephrostomy tube ([place])   ? Acute respiratory failure with hypoxia ([place])   ? Shortness of breath   ? Malnutrition of moderate  degree 04/11/2021  ? Cardiac arrest ([place]) 04/11/2021  ? Acute on chronic combined systolic and diastolic CHF (congestive heart failure) ([place]) 04/09/2021  ? Noncompliance of patient with renal dialysis 04/09/2021  ? Protein-calorie malnutrition, severe 03/01/2021  ? Arteriovenous fistula occlusion (HCC)   ? Pleural effusion 02/28/2021  ? Arteriovenous fistula thrombosis ([place]) 02/28/2021  ? Metabolic acidosis 19/59/7471  ? History of traumatic brain injury  09/07/2020  ? Cognitive impairment 09/07/2020  ? Metabolic encephalopathy 85/50/1586  ? ESRD needing dialysis ([place]) 09/04/2020  ? Hypoglycemia 09/04/2020  ? Uremia 09/04/2020  ? COVID-19 virus infection 06/18/2020  ? Thrombocytopenia ([place]) 06/18/2020  ? Elevated troponin 06/18/2020  ? Hyperkalemia 06/13/2020  ? Anemia in ESRD (end-stage renal disease) ([place]) 09/17/2019  ? Essential hypertension 07/15/2019  ? Testicular torsion   ? Pyuria 06/10/2016  ? Chronic pain following surgery or procedure 12/01/2015  ? 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[place]) 12/01/2015  ? Tobacco use disorder 12/01/2015  ? ESRD (end stage renal disease) ([place]) 04/28/2014  ? Obstructed nephrostomy tube ([place]) 10/23/2013  ? 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 Neurogenic bladder 02/09/1998  ? Urinary calculus 02/09/1998  ? 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 ?PCP:  [facility] [place] ?Pharmacy:   ?[place] ?[place] [address] ?Phone: [phone] Fax: [phone] ? ? ? ? ?Social Determinants of Health (SDOH) Interventions ?  ? ?Readmission Risk Interventions ? ?  08/30/2021  ? 12:02 PM 04/12/2021  ? 12:59 PM 09/08/2020  ?  3:10 PM  ?Readmission Risk Prevention Plan  ?Transportation Screening Complete Complete Complete  ?Social Work Consult for [place] Planning/Counseling   Complete  ?Palliative Care Screening   Complete  ?Medication Review Press photographer) Complete Complete Complete  ?PCP or Specialist appointment within 3-5 days of discharge Complete Complete   ?[place] or Home Care Consult Complete Complete   ?[place] or Home Care Consult Pt Refusal Comments  has in home care aids   ?SW Recovery Care/Counseling Consult Complete Not Complete   ?SW Consult Not Complete Comments  pt intubated   ?Palliative Care Screening Not Applicable Not Complete   ?Comments  intubated   ?Skilled Nursing Facility Not Applicable Not Complete   ?SNF  Comments  intubated   ? ? ? ?

## 2021-08-30 NOTE — Assessment & Plan Note (Signed)
-   Patient?s BMI is Body mass index is 20.6 kg/m?Marland Kitchen. ?- Patient has the following signs/symptoms consistent with PCM: (fat loss, muscle loss, muscle wasting) ?- continue renal diet ?

## 2021-08-30 NOTE — Assessment & Plan Note (Addendum)
-   Appears to be wheelchair bound at baseline ?

## 2021-08-30 NOTE — Progress Notes (Signed)
Pt refused heparin subcutaneous injection but was educated about its importance. Receiving nurse made aware. Will continue to monitor. ?

## 2021-08-30 NOTE — Progress Notes (Signed)
Patient repositioned on stretcher, lunch tray arrived, meats cut for patient.  ?

## 2021-08-30 NOTE — Progress Notes (Signed)
Lunch tray ordered for patient.

## 2021-08-30 NOTE — Assessment & Plan Note (Signed)
-   Continue Coreg and Avapro ?

## 2021-08-30 NOTE — ED Notes (Signed)
Refusing additional blood draws to obtain cultures. Will allow labs pulled from IV.  ? ? ?

## 2021-08-30 NOTE — Assessment & Plan Note (Addendum)
-   Chronic right nephrostomy tube with what appears to be poor maintenance and poor hygiene in general.  Dressing out of place and sutures also removed with concern for malposition of tube.  Patient unable to provide maintenance details of catheter care as well ?-CT also shows diffuse urinary bladder wall thickening consistent with either chronic bladder outlet obstruction versus cystitis ?-Suspect some component of colonization given chronicity however given significant weakness on admission, there is still concern for possible underlying infectious etiology; also has a known history of MRSA UTIs ?-Treated with vancomycin and cefepime which were de-escalated to Augmentin to complete course at discharge ?-IR consulted for nephrostomy tube exchange; able to be performed on 09/01/2021 ?

## 2021-08-30 NOTE — Progress Notes (Signed)
?[facility]  ?ROUNDING NOTE  ? ?Subjective:  ? ?Larry Morrison is a 51 year old African-American male with past medical history including pulmonary hypertension, combined CHF, CVA, hypertension, neurogenic bladder with right nephrostomy tube, and end-stage renal disease on hemodialysis.  Patient presents to the emergency department at the recommendation of his brother stating generalized weakness and a missed dialysis treatment.  Patient has been admitted for Generalized weakness [R53.1] ? ?Patient is known to our practice and receives outpatient dialysis treatments at [facility] on a MWF schedule, supervised by Dr. Juleen China.  Last dialysis treatment received on Friday.  Patient states he missed Monday due to oversleeping and missed transportation.  Patient complains of increased fatigue over the past couple days.  Reports feeling tired with poor appetite.  Denies nausea and vomiting.  Denies shortness of breath, remains on room air.  No lower extremity edema.  Denies sick contacts. ? ?Lab work on arrival include potassium 5.0, bicarb 21, BUN 74, creatinine 9.61 with GFR 6, lactic acid 2.1, and albumin 2.6.  Urinalysis cloudy in appearance with hematuria and mild proteinuria which large leukocytes.  CT chest abdomen pelvis show stable bilateral pleural effusions and stable upper lobe consolidation and patchy airspace disease. ? ?We have been consulted to manage dialysis needs during this admission ? ? ?Objective:  ?Vital signs in last 24 hours:  ?Temp:  [97.6 ?F (36.4 ?C)-98.2 ?F (36.8 ?C)] 98.2 ?F (36.8 ?C) (04/11 0327) ?Pulse Rate:  [70-96] 70 (04/11 1300) ?Resp:  [18-31] 19 (04/11 1300) ?BP: (119-173)/(71-117) 119/71 (04/11 1300) ?SpO2:  [93 %-100 %] 96 % (04/11 1300) ?Weight:  [54.4 kg] 54.4 kg (04/10 2339) ? ?Weight change:  ?Filed Weights  ? 08/29/21 2339  ?Weight: 54.4 kg  ? ? ?Intake/Output: ?No intake/output data recorded. ?  ?Intake/Output this shift: ? No intake/output data  recorded. ? ?Physical Exam: ?General: NAD, resting quietly  ?Head: Normocephalic, atraumatic. Moist oral mucosal membranes  ?Eyes: Anicteric  ?Lungs:  Clear to auscultation, normal effort, room air  ?Heart: Regular rate and rhythm  ?Abdomen:  Soft, nontender, nondistended  ?Extremities: No peripheral edema.  ?Neurologic: Nonfocal, moving all four extremities  ?Skin: No lesions  ?Access: Right IJ PermCath  ? ? ?Basic Metabolic Panel: ?Recent Labs  ?Lab 08/30/21 ?0403  ?NA 140  ?K 5.0  ?CL 105  ?CO2 21*  ?GLUCOSE 86  ?BUN 74*  ?CREATININE 9.61*  ?CALCIUM 8.0*  ?MG 2.0  ? ? ?Liver Function Tests: ?Recent Labs  ?Lab 08/30/21 ?0403  ?AST 30  ?ALT 26  ?ALKPHOS 131*  ?BILITOT 0.9  ?PROT 8.4*  ?ALBUMIN 2.6*  ? ?No results for input(s): LIPASE, AMYLASE in the last 168 hours. ?No results for input(s): AMMONIA in the last 168 hours. ? ?CBC: ?Recent Labs  ?Lab 08/29/21 ?2340 08/30/21 ?4034  ?WBC 6.1 6.7  ?NEUTROABS 4.5  --   ?HGB 10.9* 10.7*  ?HCT 37.1* 36.8*  ?MCV 100.3* 102.8*  ?PLT 174 156  ? ? ?Cardiac Enzymes: ?No results for input(s): CKTOTAL, CKMB, CKMBINDEX, TROPONINI in the last 168 hours. ? ?BNP: ?Invalid input(s): POCBNP ? ?CBG: ?No results for input(s): GLUCAP in the last 168 hours. ? ?Microbiology: ?Results for orders placed or performed during the hospital encounter of 08/30/21  ?Resp Panel by RT-PCR (Flu A&B, Covid) Nasopharyngeal Swab     Status: None  ? Collection Time: 08/29/21 11:40 PM  ? Specimen: Nasopharyngeal Swab; Nasopharyngeal(NP) swabs in vial transport medium  ?Result Value Ref Range Status  ? SARS Coronavirus 2 by  RT PCR NEGATIVE NEGATIVE Final  ?  Comment: (NOTE) ?SARS-CoV-2 target nucleic acids are NOT DETECTED. ? ?The SARS-CoV-2 RNA is generally detectable in upper respiratory ?specimens during the acute phase of infection. The lowest ?concentration of SARS-CoV-2 viral copies this assay can detect is ?138 copies/mL. A negative result does not preclude SARS-Cov-2 ?infection and should not be used  as the sole basis for treatment or ?other patient management decisions. A negative result may occur with  ?improper specimen collection/handling, submission of specimen other ?than nasopharyngeal swab, presence of viral mutation(s) within the ?areas targeted by this assay, and inadequate number of viral ?copies(<138 copies/mL). A negative result must be combined with ?clinical observations, patient history, and epidemiological ?information. The expected result is Negative. ? ?Fact Sheet for Patients:  ?EntrepreneurPulse.com.au ? ?Fact Sheet for Healthcare Providers:  ?IncredibleEmployment.be ? ?This test is no t yet approved or cleared by the [place] FDA and  ?has been authorized for detection and/or diagnosis of SARS-CoV-2 by ?FDA under an Emergency Use Authorization (EUA). This EUA will remain  ?in effect (meaning this test can be used) for the duration of the ?COVID-19 declaration under Section 564(b)(1) of the Act, 21 ?U.S.C.section 360bbb-3(b)(1), unless the authorization is terminated  ?or revoked sooner.  ? ? ?  ? Influenza A by PCR NEGATIVE NEGATIVE Final  ? Influenza B by PCR NEGATIVE NEGATIVE Final  ?  Comment: (NOTE) ?The Xpert Xpress SARS-CoV-2/FLU/RSV plus assay is intended as an aid ?in the diagnosis of influenza from Nasopharyngeal swab specimens and ?should not be used as a sole basis for treatment. Nasal washings and ?aspirates are unacceptable for Xpert Xpress SARS-CoV-2/FLU/RSV ?testing. ? ?Fact Sheet for Patients: ?EntrepreneurPulse.com.au ? ?Fact Sheet for Healthcare Providers: ?IncredibleEmployment.be ? ?This test is not yet approved or cleared by the [place] FDA and ?has been authorized for detection and/or diagnosis of SARS-CoV-2 by ?FDA under an Emergency Use Authorization (EUA). This EUA will remain ?in effect (meaning this test can be used) for the duration of the ?COVID-19 declaration under Section 564(b)(1)  of the Act, 21 U.S.C. ?section 360bbb-3(b)(1), unless the authorization is terminated or ?revoked. ? ?Performed at [facility], [place], ?[place] [address] ?  ?Culture, blood (routine x 2)     Status: None (Preliminary result)  ? Collection Time: 08/30/21  2:56 AM  ? Specimen: BLOOD  ?Result Value Ref Range Status  ? Specimen Description BLOOD RIGHT FOREARM  Final  ? Special Requests   Final  ?  BOTTLES DRAWN AEROBIC AND ANAEROBIC Blood Culture adequate volume  ? Culture   Final  ?  NO GROWTH <12 HOURS ?Performed at [facility], [address], [place], [place] [address] ?  ? Report Status PENDING  Incomplete  ? ? ?Coagulation Studies: ?No results for input(s): LABPROT, INR in the last 72 hours. ? ?Urinalysis: ?Recent Labs  ?  08/30/21 ?0256  ?COLORURINE YELLOW*  ?LABSPEC 1.026  ?PHURINE 7.0  ?GLUCOSEU 50*  ?HGBUR MODERATE*  ?BILIRUBINUR NEGATIVE  ?KETONESUR NEGATIVE  ?PROTEINUR 100*  ?NITRITE NEGATIVE  ?LEUKOCYTESUR LARGE*  ?  ? ? ?Imaging: ?DG Chest 2 View ? ?Result Date: 08/30/2021 ?CLINICAL DATA:  Cough and weakness EXAM: CHEST - 2 VIEW COMPARISON:  06/30/2021 FINDINGS: Cardiac shadow is enlarged but stable. Dialysis catheter is again seen. Bilateral pleural effusions are noted left slightly greater than right stable in appearance from the prior exam. Some patchy atelectatic changes are seen also stable from the prior study. No new focal abnormality  is noted. IMPRESSION: Bilateral pleural effusions and basilar atelectasis stable from the prior exam. Electronically Signed   By: Inez Catalina M.D.   On: 08/30/2021 00:30  ? ?CT CHEST ABDOMEN PELVIS W CONTRAST ? ?Result Date: 08/30/2021 ?CLINICAL DATA:  Mid abdominal pain. EXAM: CT CHEST, ABDOMEN, AND PELVIS WITH CONTRAST TECHNIQUE: Multidetector CT imaging of the chest, abdomen and pelvis was performed following the standard protocol during bolus administration of intravenous contrast. RADIATION DOSE REDUCTION: This exam was  performed according to the departmental dose-optimization program which includes automated exposure control, adjustment of the mA and/or kV according to patient size and/or use of iterative reconstruction technique. C

## 2021-08-30 NOTE — Assessment & Plan Note (Addendum)
-   Per CT chest, shows stable bilateral pleural effusions with partially loculated right-sided effusion ?- Continue antibiotics ?-Respiratory status and cough seem to be improved since admission.  He remains afebrile, no leukocytosis ?- Will complete antibiotic course ?-If recurrent hospitalization for further respiratory symptoms, may need further evaluation and invasive treatment such as VATS/chest tube/or thoracentesis for sampling  ?

## 2021-08-30 NOTE — ED Notes (Signed)
Patient resting in stretcher with eyes closed. Respirations even and unlabored. NAD noted. ?

## 2021-08-30 NOTE — Hospital Course (Addendum)
Larry Morrison is a 51 yo male with PMH ESRD on HD MWF, HTN, neurogenic bladder with chronic right nephrostomy tube, pulmonary HTN, chronic systolic/diastolic CHF, CVA, recurrent UTIs wheelchair dependent who presented with generalized weakness and missing HD session on 4/10 due to the weakness. He had some reported cough too on admission with sputum production. ?There was concern for infection on presentation and he underwent CT chest/abdomen/pelvis.  This showed stable areas of marked severity left lower lobe and inferolateral left upper lobe consolidation and patchy airspace disease.  Stable bilateral pleural effusions partially loculated on the right. Also noted atrophic right kidney s/p nephrostomy tube, diffuse urinary bladder wall thickening. ?He had mildly elevated lactic acid, 2.1 and elevated procalcitonin 1.92.  He was started on vancomycin and cefepime and admitted for further infectious work-up. ?

## 2021-08-30 NOTE — ED Notes (Signed)
Girguis, MD at bedside assessing patient. ?

## 2021-08-30 NOTE — ED Provider Notes (Signed)
? ?[facility] ?Provider Note ? ? ? Event Date/Time  ? First MD Initiated Contact with Patient 08/30/21 0203   ?  (approximate) ? ? ?History  ? ?Chief Complaint ?Weakness ? ? ?HPI ? ?Larry Morrison is a 51 y.o. male with past medical history of hypertension, stroke, CHF (EF less than 20%), ESRD on HD (MWF), neurogenic bladder status post right nephrostomy, and cognitive disability who presents to the ED for weakness.  Patient reports that he started feeling generally weak after waking up from a nap this afternoon.  He describes malaise with a productive cough and nausea, denies any chest pain, shortness of breath, or abdominal pain.  He reports feeling bad enough earlier today that he missed his usual dialysis appointment, last received dialysis 3 days ago on Friday.  He is not aware of any fevers or sick contacts, states he has felt chills at times today.  He states his nephrostomy tube has been working normally and he has not noticed anything unusual about his urine. ?  ? ? ?Physical Exam  ? ?Triage Vital Signs: ?ED Triage Vitals  ?Enc Vitals Group  ?   BP 08/29/21 2339 (!) 162/116  ?   Pulse Rate 08/29/21 2339 87  ?   Resp 08/29/21 2339 20  ?   Temp 08/29/21 2339 97.6 ?F (36.4 ?C)  ?   Temp Source 08/29/21 2339 Axillary  ?   SpO2 08/29/21 2339 100 %  ?   Weight 08/29/21 2339 120 lb (54.4 kg)  ?   Height 08/29/21 2339 5\' 4"  (1.626 m)  ?   Head Circumference --   ?   Peak Flow --   ?   Pain Score 08/29/21 2340 0  ?   Pain Loc --   ?   Pain Edu? --   ?   Excl. in [place]? --   ? ? ?Most recent vital signs: ?Vitals:  ? 08/30/21 0327 08/30/21 0400  ?BP: (!) 173/117 (!) 168/112  ?Pulse: 88 89  ?Resp: (!) 31 18  ?Temp: 98.2 ?F (36.8 ?C)   ?SpO2: 95% 95%  ? ? ?Constitutional: Alert and oriented. ?Eyes: Conjunctivae are normal. ?Head: Atraumatic. ?Nose: No congestion/rhinnorhea. ?Mouth/Throat: Mucous membranes are moist.  ?Cardiovascular: Normal rate, regular rhythm. Grossly normal heart sounds.  2+ radial  pulses bilaterally.  Right IJ TDC intact with no associated erythema, warmth, or tenderness. ?Respiratory: Tachypneic with normal respiratory effort.  No retractions. Lungs diminished to bilateral bases with no wheezing or crackles noted. ?Gastrointestinal: Soft and nontender. No distention. ?Genitourinary: Right nephrostomy tube with dressing dirty and placed away from site, sutures no longer intact with nephrostomy tube appearing slightly displaced. ?Musculoskeletal: No lower extremity tenderness nor edema.  ?Neurologic:  Normal speech and language. No gross focal neurologic deficits are appreciated. ? ? ? ?ED Results / Procedures / Treatments  ? ?Labs ?(all labs ordered are listed, but only abnormal results are displayed) ?Labs Reviewed  ?CBC WITH DIFFERENTIAL/PLATELET - Abnormal; Notable for the following components:  ?    Result Value  ? RBC 3.70 (*)   ? Hemoglobin 10.9 (*)   ? HCT 37.1 (*)   ? MCV 100.3 (*)   ? MCHC 29.4 (*)   ? RDW 16.2 (*)   ? All other components within normal limits  ?LACTIC ACID, PLASMA - Abnormal; Notable for the following components:  ? Lactic Acid, Venous 2.1 (*)   ? All other components within normal limits  ?COMPREHENSIVE METABOLIC PANEL - Abnormal;  Notable for the following components:  ? CO2 21 (*)   ? BUN 74 (*)   ? Creatinine, Ser 9.61 (*)   ? Calcium 8.0 (*)   ? Total Protein 8.4 (*)   ? Albumin 2.6 (*)   ? Alkaline Phosphatase 131 (*)   ? GFR, Estimated 6 (*)   ? All other components within normal limits  ?TROPONIN I (HIGH SENSITIVITY) - Abnormal; Notable for the following components:  ? Troponin I (High Sensitivity) 78 (*)   ? All other components within normal limits  ?TROPONIN I (HIGH SENSITIVITY) - Abnormal; Notable for the following components:  ? Troponin I (High Sensitivity) 80 (*)   ? All other components within normal limits  ?RESP PANEL BY RT-PCR (FLU A&B, COVID) [id number]  ?CULTURE, BLOOD (ROUTINE X 2)  ?CULTURE, BLOOD (ROUTINE X 2)  ?URINE CULTURE  ?LACTIC ACID, PLASMA   ?MAGNESIUM  ?PROCALCITONIN  ? ? ? ?EKG ? ?ED ECG REPORT ?Tempie Hoist, the attending physician, personally viewed and interpreted this ECG. ? ? Date: 08/30/2021 ? EKG Time: 23:44 ? Rate: 89 ? Rhythm: normal sinus rhythm ? Axis: RAD ? Intervals: Prolonged QT ? ST&T Change: None ? ?RADIOLOGY ?Chest x-ray reviewed by me with bilateral pleural effusions similar to previous, hemodialysis catheter appropriately positioned. ? ?PROCEDURES: ? ?Critical Care performed: No ? ?.1-3 Lead EKG Interpretation ?Performed by: Blake Divine, MD ?Authorized by: Blake Divine, MD  ? ?  Interpretation: normal   ?  ECG rate:  8-100 ?  ECG rate assessment: normal   ?  Rhythm: sinus rhythm   ?  Ectopy: none   ?  Conduction: normal   ? ? ?MEDICATIONS ORDERED IN ED: ?Medications  ?azithromycin (ZITHROMAX) 500 mg in sodium chloride 0.9 % 250 mL IVPB (has no administration in time range)  ?ceFEPIme (MAXIPIME) 1 g in sodium chloride 0.9 % 100 mL IVPB (0 g Intravenous Stopped 08/30/21 0356)  ?iohexol (OMNIPAQUE) 300 MG/ML solution 80 mL (80 mLs Intravenous Contrast Given 08/30/21 0310)  ? ? ? ?IMPRESSION / MDM / ASSESSMENT AND PLAN / ED COURSE  ?I reviewed the triage vital signs and the nursing notes. ?             ?               ? ?51 y.o. male with past medical history of hypertension, CHF, stroke, ESRD on HD (MWF), neurogenic bladder status post right nephrostomy, and cognitive disability who presents to the ED for generalized weakness, nausea, malaise, and cough starting today. ? ?Differential diagnosis includes, but is not limited to, sepsis, pneumonia, pleural effusion, COVID-19, hyperkalemia, UTI, displaced nephrostomy. ? ?Patient chronically ill-appearing but in no acute distress, vital signs in triage were reassuring however patient appears tachypneic on my assessment. With his symptoms and tachypnea, I am concerned for the possibility of sepsis.  We will draw blood cultures and lactate, hold off on IV fluid administration given  his low EF with stable blood pressure. Chest x-ray shows bilateral pleural effusions, similar to previous with no obvious infiltrate.  Labs thus far show stable anemia and no leukocytosis, troponin is elevated but similar to previous and low suspicion for ACS at this time.  We will further assess for infectious source with CT of his chest/abdomen/pelvis, start patient on IV cefepime for suspected sepsis.  He is at significant risk for hyperkalemia or other electrolyte abnormality given missed dialysis, EKG does show prolonged QT but no ischemic changes. ? ? The patient  is on the cardiac monitor to evaluate for evidence of arrhythmia and/or significant heart rate changes. ? ?CT of his chest shows significant chronic infiltrates and bilateral effusions, given patient's clinical appearance with productive cough, I am concerned that he has developed acute pneumonia on top of this.  We will add on azithromycin for antibiotic coverage.  Imaging of his abdomen/pelvis shows appropriate positioning of nephrostomy tube, UA is pending but UTI seems less likely.  Repeat troponin is stable.  Case discussed with hospitalist for admission. ? ?  ? ? ?FINAL CLINICAL IMPRESSION(S) / ED DIAGNOSES  ? ?Final diagnoses:  ?Generalized weakness  ?Healthcare-associated pneumonia  ? ? ? ?Rx / DC Orders  ? ?ED Discharge Orders   ? ? None  ? ?  ? ? ? ?Note:  This document was prepared using Dragon voice recognition software and may include unintentional dictation errors. ?  ?Blake Divine, MD ?08/30/21 0507 ? ?

## 2021-08-30 NOTE — Progress Notes (Signed)
?Progress Note ? ? ? ?Larry Morrison   ?YHC:[id number]  ?DOB: August 18, 1970  ?DOA: 08/30/2021     0 ?PCP: [facility] ? ?Initial CC: Weakness ? ?Hospital Course: ?Larry Morrison is a 51 yo male with PMH ESRD on HD MWF, HTN, neurogenic bladder with chronic right nephrostomy tube, pulmonary HTN, chronic systolic/diastolic CHF, CVA, recurrent UTIs wheelchair dependent who presented with generalized weakness and missing HD session on 4/10 due to the weakness. He had some reported cough too on admission with sputum production. ?There was concern for infection on presentation and he underwent CT chest/abdomen/pelvis.  This showed stable areas of marked severity left lower lobe and inferolateral left upper lobe consolidation and patchy airspace disease.  Stable bilateral pleural effusions partially loculated on the right. Also noted atrophic right kidney s/p nephrostomy tube, diffuse urinary bladder wall thickening. ?He had mildly elevated lactic acid, 2.1 and elevated procalcitonin 1.92.  He was started on vancomycin and cefepime and admitted for further infectious work-up. ? ?Interval History:  ?Seen in the ER this morning.  He was slightly agitated but was cooperative.  Complaining of cough and weakness although slightly poor historian and could not elaborate much on his symptoms.  Informed him we are treating for urine infection and possible pneumonia and also going to have nephrostomy tube placed in setting of presumed infection. ? ?Assessment and Plan: ?* Generalized weakness ?- Appears to be wheelchair bound at baseline ?- monitor weakness response with infection treatment and workup ? ?UTI (urinary tract infection) ?- Chronic right nephrostomy tube with what appears to be poor maintenance and poor hygiene in general.  Dressing out of place and sutures also removed with concern for malposition of tube.  Patient unable to provide maintenance details of catheter care as well ?-CT also shows diffuse urinary bladder  wall thickening consistent with either chronic bladder outlet obstruction versus cystitis ?-Suspect some component of colonization given chronicity however given significant weakness on admission, there is still concern for possible underlying infectious etiology; also has a known history of MRSA UTIs ?-Continue vancomycin and cefepime and follow-up all cultures ?-IR consulted for nephrostomy tube exchange ? ?Loculated pleural effusion ?- Per CT chest, shows stable bilateral pleural effusions with partially loculated right-sided effusion ?- Continue antibiotics as noted above.  If no significant improvement, may need to consider further work-up regarding loculation ? ?ESRD (end stage renal disease) ([place]) ?- History of noncompliance.  Missed 08/29/2021 dialysis session due to his weakness ?-Nephrology aware of hospitalization ?- Continue dialysis plan per nephrology ? ?Prolonged QT interval ?- admission EKG noted with QTc>520 ?- monitor K and Mg ?- avoiding prolonging agents as able  ? ?Chronic combined systolic and diastolic heart failure ([place]) ?- No signs of exacerbation at this time ?- Last echo October 2022: EF less than 51%, grade 2 diastolic dysfunction ? ?Protein-calorie malnutrition, severe ?- Patient?s BMI is Body mass index is 20.6 kg/m?Marland Kitchen. ?- Patient has the following signs/symptoms consistent with PCM: (fat loss, muscle loss, muscle wasting) ?- continue renal diet ? ?Anemia in ESRD (end-stage renal disease) ([place]) ?- Baseline hemoglobin approximately 9 to 10 g/dL.  Currently at baseline ? ?Essential hypertension ?- Continue Coreg and Avapro ? ? ?Old records reviewed in assessment of this patient ? ?Antimicrobials: ?Cefepime 08/30/2021 >> current ?Vancomycin 08/30/2021 >> current ? ?DVT prophylaxis:  ?heparin injection 5,000 Units Start: 08/30/21 0600 ? ? ?Code Status:   Code Status: Full Code ? ?Disposition Plan: Home in 2 to 3 days ?Status is: Inpatient ? ?  Objective: ?Blood pressure 139/86, pulse 75,  temperature 98.2 ?F (36.8 ?C), resp. rate (!) 25, height 5\' 4"  (1.626 m), weight 54.4 kg, SpO2 97 %.  ?Examination:  ?Physical Exam ?Constitutional:   ?   General: He is not in acute distress. ?HENT:  ?   Head: Normocephalic and atraumatic.  ?   Mouth/Throat:  ?   Mouth: Mucous membranes are moist.  ?Eyes:  ?   Extraocular Movements: Extraocular movements intact.  ?Cardiovascular:  ?   Rate and Rhythm: Normal rate and regular rhythm.  ?   Heart sounds: Normal heart sounds.  ?Pulmonary:  ?   Effort: Pulmonary effort is normal. No respiratory distress.  ?   Comments: Coarse breath sounds bilaterally.  No wheezing ?Chest:  ?   Comments: Tenderness over right nephrostomy tube site ?Abdominal:  ?   General: Bowel sounds are normal. There is no distension.  ?   Palpations: Abdomen is soft.  ?   Tenderness: There is no abdominal tenderness.  ?Musculoskeletal:     ?   General: Normal range of motion.  ?   Cervical back: Normal range of motion and neck supple.  ?Skin: ?   General: Skin is warm and dry.  ?Neurological:  ?   General: No focal deficit present.  ?   Mental Status: He is alert.  ?Psychiatric:     ?   Mood and Affect: Mood normal.     ?   Behavior: Behavior normal.  ?  ? ?Consultants:  ?Nephrology ?IR ? ?Procedures:  ? ? ?Data Reviewed: ?Results for orders placed or performed during the hospital encounter of 08/30/21 (from the past 24 hour(s))  ?CBC with Differential     Status: Abnormal  ? Collection Time: 08/29/21 11:40 PM  ?Result Value Ref Range  ? WBC 6.1 4.0 - 10.5 K/uL  ? RBC 3.70 (L) 4.22 - 5.81 MIL/uL  ? Hemoglobin 10.9 (L) 13.0 - 17.0 g/dL  ? HCT 37.1 (L) 39.0 - 52.0 %  ? MCV 100.3 (H) 80.0 - 100.0 fL  ? MCH 29.5 26.0 - 34.0 pg  ? MCHC 29.4 (L) 30.0 - 36.0 g/dL  ? RDW 16.2 (H) 11.5 - 15.5 %  ? Platelets 174 150 - 400 K/uL  ? nRBC 0.0 0.0 - 0.2 %  ? Neutrophils Relative % 74 %  ? Neutro Abs 4.5 1.7 - 7.7 K/uL  ? Lymphocytes Relative 16 %  ? Lymphs Abs 1.0 0.7 - 4.0 K/uL  ? Monocytes Relative 6 %  ?  Monocytes Absolute 0.4 0.1 - 1.0 K/uL  ? Eosinophils Relative 3 %  ? Eosinophils Absolute 0.2 0.0 - 0.5 K/uL  ? Basophils Relative 1 %  ? Basophils Absolute 0.0 0.0 - 0.1 K/uL  ? Immature Granulocytes 0 %  ? Abs Immature Granulocytes 0.02 0.00 - 0.07 K/uL  ?Troponin I (High Sensitivity)     Status: Abnormal  ? Collection Time: 08/29/21 11:40 PM  ?Result Value Ref Range  ? Troponin I (High Sensitivity) 78 (H) <18 ng/L  ?Resp Panel by RT-PCR (Flu A&B, Covid) Nasopharyngeal Swab     Status: None  ? Collection Time: 08/29/21 11:40 PM  ? Specimen: Nasopharyngeal Swab; Nasopharyngeal(NP) swabs in vial transport medium  ?Result Value Ref Range  ? SARS Coronavirus 2 by RT PCR NEGATIVE NEGATIVE  ? Influenza A by PCR NEGATIVE NEGATIVE  ? Influenza B by PCR NEGATIVE NEGATIVE  ?Troponin I (High Sensitivity)     Status: Abnormal  ? Collection Time:  08/30/21  2:07 AM  ?Result Value Ref Range  ? Troponin I (High Sensitivity) 80 (H) <18 ng/L  ?Culture, blood (routine x 2)     Status: None (Preliminary result)  ? Collection Time: 08/30/21  2:56 AM  ? Specimen: BLOOD  ?Result Value Ref Range  ? Specimen Description BLOOD RIGHT FOREARM   ? Special Requests    ?  BOTTLES DRAWN AEROBIC AND ANAEROBIC Blood Culture adequate volume  ? Culture    ?  NO GROWTH <12 HOURS ?Performed at [facility], [address] [place], [place] [address] ?  ? Report Status PENDING   ?Lactic acid, plasma     Status: None  ? Collection Time: 08/30/21  2:56 AM  ?Result Value Ref Range  ? Lactic Acid, Venous 1.9 0.5 - 1.9 mmol/L  ?Urinalysis, Routine w reflex microscopic     Status: Abnormal  ? Collection Time: 08/30/21  2:56 AM  ?Result Value Ref Range  ? Color, Urine YELLOW (A) YELLOW  ? APPearance CLOUDY (A) CLEAR  ? Specific Gravity, Urine 1.026 1.005 - 1.030  ? pH 7.0 5.0 - 8.0  ? Glucose, UA 50 (A) NEGATIVE mg/dL  ? Hgb urine dipstick MODERATE (A) NEGATIVE  ? Bilirubin Urine NEGATIVE NEGATIVE  ? Ketones, ur NEGATIVE NEGATIVE mg/dL  ? Protein,  ur 100 (A) NEGATIVE mg/dL  ? Nitrite NEGATIVE NEGATIVE  ? Leukocytes,Ua LARGE (A) NEGATIVE  ? RBC / HPF >50 (H) 0 - 5 RBC/hpf  ? WBC, UA >50 (H) 0 - 5 WBC/hpf  ? Bacteria, UA NONE SEEN NONE SEEN  ? Squamous Epithelial

## 2021-08-31 ENCOUNTER — Ambulatory Visit: Admission: RE | Admit: 2021-08-31 | Payer: Medicaid Other | Source: Home / Self Care | Admitting: Vascular Surgery

## 2021-08-31 ENCOUNTER — Inpatient Hospital Stay: Payer: Medicaid Other

## 2021-08-31 ENCOUNTER — Encounter
Admission: EM | Disposition: A | Discharge status: Home / Self Care | Payer: Self-pay | Source: Home / Self Care | Attending: Internal Medicine

## 2021-08-31 DIAGNOSIS — J90 Pleural effusion, not elsewhere classified: Secondary | ICD-10-CM | POA: Diagnosis not present

## 2021-08-31 DIAGNOSIS — R531 Weakness: Secondary | ICD-10-CM | POA: Diagnosis not present

## 2021-08-31 DIAGNOSIS — Z01812 Encounter for preprocedural laboratory examination: Secondary | ICD-10-CM

## 2021-08-31 DIAGNOSIS — N186 End stage renal disease: Secondary | ICD-10-CM | POA: Diagnosis not present

## 2021-08-31 DIAGNOSIS — N3000 Acute cystitis without hematuria: Secondary | ICD-10-CM | POA: Diagnosis not present

## 2021-08-31 LAB — MAGNESIUM: Magnesium: 2.1 mg/dL (ref 1.7–2.4)

## 2021-08-31 LAB — RENAL FUNCTION PANEL
Albumin: 2.4 g/dL — ABNORMAL LOW (ref 3.5–5.0)
Anion gap: 15 (ref 5–15)
BUN: 89 mg/dL — ABNORMAL HIGH (ref 6–20)
CO2: 18 mmol/L — ABNORMAL LOW (ref 22–32)
Calcium: 7.7 mg/dL — ABNORMAL LOW (ref 8.9–10.3)
Chloride: 105 mmol/L (ref 98–111)
Creatinine, Ser: 10.43 mg/dL — ABNORMAL HIGH (ref 0.61–1.24)
GFR, Estimated: 5 mL/min — ABNORMAL LOW (ref >60)
Glucose, Bld: 94 mg/dL (ref 70–99)
Phosphorus: 8.4 mg/dL — ABNORMAL HIGH (ref 2.5–4.6)
Potassium: 5.8 mmol/L — ABNORMAL HIGH (ref 3.5–5.1)
Sodium: 138 mmol/L (ref 135–145)

## 2021-08-31 LAB — TYPE AND SCREEN
ABO/RH(D): B POS
Antibody Screen: NEGATIVE

## 2021-08-31 LAB — CBC
HCT: 31.6 % — ABNORMAL LOW (ref 39.0–52.0)
Hemoglobin: 9.8 g/dL — ABNORMAL LOW (ref 13.0–17.0)
MCH: 30.1 pg (ref 26.0–34.0)
MCHC: 31 g/dL (ref 30.0–36.0)
MCV: 96.9 fL (ref 80.0–100.0)
Platelets: 185 10*3/uL (ref 150–400)
RBC: 3.26 MIL/uL — ABNORMAL LOW (ref 4.22–5.81)
RDW: 16.5 % — ABNORMAL HIGH (ref 11.5–15.5)
WBC: 8.3 10*3/uL (ref 4.0–10.5)
nRBC: 0 % (ref 0.0–0.2)

## 2021-08-31 LAB — URINE CULTURE: Culture: <10000 — AB

## 2021-08-31 LAB — PROCALCITONIN: Procalcitonin: 1.87 ng/mL

## 2021-08-31 LAB — HEPATITIS B SURFACE ANTIGEN: Hepatitis B Surface Ag: NONREACTIVE

## 2021-08-31 LAB — HEPATITIS B SURFACE ANTIBODY,QUALITATIVE: Hep B S Ab: NONREACTIVE

## 2021-08-31 SURGERY — INSERTION OF ARTERIOVENOUS (AV) GORE-TEX GRAFT ARM
Anesthesia: General | Laterality: Left

## 2021-08-31 MED ORDER — ACETAMINOPHEN 500 MG PO TABS
1000.0000 mg | ORAL_TABLET | Freq: Once | ORAL | Status: AC
Start: 2021-08-31 — End: 2021-08-31
  Administered 2021-08-31: 1000 mg via ORAL
  Filled 2021-08-31: qty 2

## 2021-08-31 MED ORDER — SODIUM CHLORIDE 0.9 % IV SOLN: 100.0000 mL | INTRAVENOUS | Status: DC | PRN

## 2021-08-31 MED ORDER — LIDOCAINE HCL (PF) 1 % IJ SOLN
5.0000 mL | INTRAMUSCULAR | Status: DC | PRN
  Filled 2021-08-31 (×2): qty 5

## 2021-08-31 MED ORDER — ALTEPLASE 2 MG IJ SOLR: 2.0000 mg | Freq: Once | INTRAMUSCULAR | Status: DC | PRN

## 2021-08-31 MED ORDER — HEPARIN SODIUM (PORCINE) 1000 UNIT/ML IJ SOLN
INTRAMUSCULAR | Status: AC
  Filled 2021-08-31: qty 10

## 2021-08-31 MED ORDER — PENTAFLUOROPROP-TETRAFLUOROETH EX AERO: 1.0000 "application " | INHALATION_SPRAY | CUTANEOUS | Status: DC | PRN

## 2021-08-31 MED ORDER — HEPARIN SODIUM (PORCINE) 1000 UNIT/ML DIALYSIS
1000.0000 [IU] | INTRAMUSCULAR | Status: DC | PRN
  Administered 2021-08-31: 1000 [IU] via INTRAVENOUS_CENTRAL
  Filled 2021-08-31 (×3): qty 1

## 2021-08-31 MED ORDER — LORAZEPAM 2 MG/ML IJ SOLN
0.5000 mg | Freq: Once | INTRAMUSCULAR | Status: AC
  Administered 2021-08-31: 0.5 mg via INTRAVENOUS
  Filled 2021-08-31: qty 1

## 2021-08-31 MED ORDER — LIDOCAINE-PRILOCAINE 2.5-2.5 % EX CREA: 1.0000 "application " | TOPICAL_CREAM | CUTANEOUS | Status: DC | PRN

## 2021-08-31 SURGICAL SUPPLY — 50 items
BAG DECANTER FOR FLEXI CONT (MISCELLANEOUS) ×2 IMPLANT
BLADE SURG SZ11 CARB STEEL (BLADE) ×2 IMPLANT
BOOT SUTURE AID YELLOW STND (SUTURE) ×2 IMPLANT
BRUSH SCRUB EZ  4% CHG (MISCELLANEOUS) ×1
BRUSH SCRUB EZ 4% CHG (MISCELLANEOUS) ×1 IMPLANT
CHLORAPREP W/TINT 26 (MISCELLANEOUS) ×2 IMPLANT
CLIP SPRNG 6MM S-JAW DBL (CLIP) ×2
DERMABOND ADVANCED (GAUZE/BANDAGES/DRESSINGS) ×1
DERMABOND ADVANCED .7 DNX12 (GAUZE/BANDAGES/DRESSINGS) ×1 IMPLANT
ELECT CAUTERY BLADE 6.4 (BLADE) ×2 IMPLANT
ELECT REM PT RETURN 9FT ADLT (ELECTROSURGICAL) ×2
ELECTRODE REM PT RTRN 9FT ADLT (ELECTROSURGICAL) ×1 IMPLANT
GLOVE SURG SYN 7.0 (GLOVE) ×2 IMPLANT
GOWN STRL REUS W/ TWL LRG LVL3 (GOWN DISPOSABLE) ×1 IMPLANT
GOWN STRL REUS W/ TWL XL LVL3 (GOWN DISPOSABLE) ×1 IMPLANT
GOWN STRL REUS W/TWL LRG LVL3 (GOWN DISPOSABLE) ×1
GOWN STRL REUS W/TWL XL LVL3 (GOWN DISPOSABLE) ×1
HEMOSTAT SURGICEL 2X3 (HEMOSTASIS) ×2 IMPLANT
IV NS 500ML (IV SOLUTION) ×1
IV NS 500ML BAXH (IV SOLUTION) ×1 IMPLANT
KIT TURNOVER KIT A (KITS) ×2 IMPLANT
LABEL OR SOLS (LABEL) ×2 IMPLANT
LOOP RED MAXI  1X406MM (MISCELLANEOUS) ×1
LOOP VESSEL MAXI 1X406 RED (MISCELLANEOUS) ×1 IMPLANT
LOOP VESSEL MINI 0.8X406 BLUE (MISCELLANEOUS) ×1 IMPLANT
LOOPS BLUE MINI 0.8X406MM (MISCELLANEOUS) ×1
MANIFOLD NEPTUNE II (INSTRUMENTS) ×2 IMPLANT
NEEDLE FILTER BLUNT 18X 1/2SAF (NEEDLE) ×1
NEEDLE FILTER BLUNT 18X1 1/2 (NEEDLE) ×1 IMPLANT
NS IRRIG 500ML POUR BTL (IV SOLUTION) ×2 IMPLANT
PACK EXTREMITY ARMC (MISCELLANEOUS) ×2 IMPLANT
PAD PREP 24X41 OB/GYN DISP (PERSONAL CARE ITEMS) ×2 IMPLANT
SOLUTION CELL SAVER (CLIP) ×1 IMPLANT
SPIKE FLUID TRANSFER (MISCELLANEOUS) ×2 IMPLANT
STOCKINETTE STRL 4IN 9604848 (GAUZE/BANDAGES/DRESSINGS) ×2 IMPLANT
SUT GORETEX 6.0 TT9 (SUTURE) ×2 IMPLANT
SUT MNCRL AB 4-0 PS2 18 (SUTURE) ×2 IMPLANT
SUT PROLENE 6 0 BV (SUTURE) ×8 IMPLANT
SUT SILK 0 SH 30 (SUTURE) ×2 IMPLANT
SUT SILK 2 0 (SUTURE) ×1
SUT SILK 2-0 18XBRD TIE 12 (SUTURE) ×1 IMPLANT
SUT SILK 3 0 (SUTURE) ×1
SUT SILK 3-0 18XBRD TIE 12 (SUTURE) ×1 IMPLANT
SUT SILK 4 0 (SUTURE) ×1
SUT SILK 4-0 18XBRD TIE 12 (SUTURE) ×1 IMPLANT
SUT VIC AB 3-0 SH 27 (SUTURE) ×1
SUT VIC AB 3-0 SH 27X BRD (SUTURE) ×1 IMPLANT
SYR 20ML LL LF (SYRINGE) ×2 IMPLANT
SYR 3ML LL SCALE MARK (SYRINGE) ×2 IMPLANT
WATER STERILE IRR 500ML POUR (IV SOLUTION) ×2 IMPLANT

## 2021-08-31 NOTE — Progress Notes (Signed)
Cross Cover ?Dose of tylenol ordered for nephrostomy tube related pain ?

## 2021-08-31 NOTE — Progress Notes (Signed)
? ?      CROSS COVER NOTE ? ?NAME: Larry Morrison ?MRN: [id number] ?DOB : 12/10/1970 ? ? ? ?Date of Service ?  08/31/2021  ?HPI/Events of Note ?  Tachypnea reported by nursing with RR varying between 20 and 45. Patient does not have increased work of breathing and breath sounds clear to auscultation.  ?Interventions ?  Plan: Central Sleep Apnea-Cheyne Stoke Breathing in CHF with EF<20% and prior CVA ? ?CPAP trial ?CXR ?Transfer to progressive ?BiPAP ?   ?  ? ?Neomia Glass MHA, MSN, FNP-BC ?Nurse Practitioner ?[facility] ?[place] ?Pager [phone] ? ?

## 2021-08-31 NOTE — Progress Notes (Signed)
?   08/31/21 0455  ?Assess: MEWS Score  ?Temp 98.6 ?F (37 ?C)  ?BP (!) 132/97  ?Pulse Rate 77  ?Resp (!) 43  ?SpO2 100 %  ?O2 Device Bi-PAP  ?Assess: MEWS Score  ?MEWS Temp 0  ?MEWS Systolic 0  ?MEWS Pulse 0  ?MEWS RR 3  ?MEWS LOC 0  ?MEWS Score 3  ?MEWS Score Color Yellow  ?Assess: if the MEWS score is Yellow or Red  ?Were vital signs taken at a resting state? Yes  ?Focused Assessment No change from prior assessment  ?Does the patient meet 2 or more of the SIRS criteria? No  ?MEWS guidelines implemented *See Row Information* Yes  ?Treat  ?MEWS Interventions Administered scheduled meds/treatments ?(started on Bipap)  ?Pain Scale 0-10  ?Pain Score 0  ?Take Vital Signs  ?Increase Vital Sign Frequency  Yellow: Q 2hr X 2 then Q 4hr X 2, if remains yellow, continue Q 4hrs ?(continue yellow protcol from 1 C)  ?Escalate  ?MEWS: Escalate Yellow: discuss with charge nurse/RN and consider discussing with provider and RRT  ?Notify: Charge Nurse/RN  ?Name of Charge Nurse/RN Notified Edwin Dada  ?Date Charge Nurse/RN Notified 08/31/21  ?Time Charge Nurse/RN Notified 0500  ?Notify: Provider  ?Provider Name/Title Claudette Head ?(already made aware from transfer unit)  ?Date Provider Notified 08/31/21  ?Assess: SIRS CRITERIA  ?SIRS Temperature  0  ?SIRS Pulse 0  ?SIRS Respirations  1  ?SIRS WBC 0  ?SIRS Score Sum  1  ? ?Patient transferred from 1C due respiratory distress increased RR was already a yellow MEWS , Bipap started, will continue to monitor  ?

## 2021-08-31 NOTE — Progress Notes (Signed)
?Progress Note ? ? ? ?Larry Morrison   ?BSJ:[id number]  ?DOB: 1971/05/22  ?DOA: 08/30/2021     1 ?PCP: [facility] ? ?Initial CC: Weakness ? ?Hospital Course: ?Larry Morrison is a 51 yo male with PMH ESRD on HD MWF, HTN, neurogenic bladder with chronic right nephrostomy tube, pulmonary HTN, chronic systolic/diastolic CHF, CVA, recurrent UTIs wheelchair dependent who presented with generalized weakness and missing HD session on 4/10 due to the weakness. He had some reported cough too on admission with sputum production. ?There was concern for infection on presentation and he underwent CT chest/abdomen/pelvis.  This showed stable areas of marked severity left lower lobe and inferolateral left upper lobe consolidation and patchy airspace disease.  Stable bilateral pleural effusions partially loculated on the right. Also noted atrophic right kidney s/p nephrostomy tube, diffuse urinary bladder wall thickening. ?He had mildly elevated lactic acid, 2.1 and elevated procalcitonin 1.92.  He was started on vancomycin and cefepime and admitted for further infectious work-up. ? ?Interval History:  ?Underwent HD today but then was refusing more procedures due to wanting to eat. He was going to have AV graft placed today as well but given treatment for infections this was postponed. Still hoping to get his nephrostomy tube exchanged if he'll allow it.  ? ?Assessment and Plan: ?* UTI (urinary tract infection) ?- Chronic right nephrostomy tube with what appears to be poor maintenance and poor hygiene in general.  Dressing out of place and sutures also removed with concern for malposition of tube.  Patient unable to provide maintenance details of catheter care as well ?-CT also shows diffuse urinary bladder wall thickening consistent with either chronic bladder outlet obstruction versus cystitis ?-Suspect some component of colonization given chronicity however given significant weakness on admission, there is still concern for  possible underlying infectious etiology; also has a known history of MRSA UTIs ?-Continue vancomycin and cefepime and follow-up all cultures ?-IR consulted for nephrostomy tube exchange ? ?Loculated pleural effusion ?- Per CT chest, shows stable bilateral pleural effusions with partially loculated right-sided effusion ?- Continue antibiotics as noted above.  If no significant improvement, may need to consider further work-up regarding loculation ? ?ESRD (end stage renal disease) ([place]) ?- History of noncompliance.  Missed 08/29/2021 dialysis session due to his weakness ?-Nephrology aware of hospitalization ?- Continue dialysis plan per nephrology ? ?Generalized weakness ?- Appears to be wheelchair bound at baseline ?- monitor weakness response with infection treatment and workup ? ?Prolonged QT interval ?- admission EKG noted with QTc>520 ?- monitor K and Mg ?- avoiding prolonging agents as able  ? ?Chronic combined systolic and diastolic heart failure ([place]) ?- No signs of exacerbation at this time ?- Last echo October 2022: EF less than 76%, grade 2 diastolic dysfunction ? ?Protein-calorie malnutrition, severe ?- Patient?s BMI is Body mass index is 20.6 kg/m?Marland Kitchen. ?- Patient has the following signs/symptoms consistent with PCM: (fat loss, muscle loss, muscle wasting) ?- continue renal diet ? ?Anemia in ESRD (end-stage renal disease) ([place]) ?- Baseline hemoglobin approximately 9 to 10 g/dL.  Currently at baseline ? ?Essential hypertension ?- Continue Coreg and Avapro ? ? ?Old records reviewed in assessment of this patient ? ?Antimicrobials: ?Cefepime 08/30/2021 >> current ?Vancomycin 08/30/2021 >> current ? ?DVT prophylaxis:  ?SCD's Start: 08/31/21 0813 ?heparin injection 5,000 Units Start: 08/30/21 0600 ? ? ?Code Status:   Code Status: Full Code ? ?Disposition Plan: Home in 2 to 3 days ?Status is: Inpatient ? ?Objective: ?Blood pressure 129/76, pulse 70, temperature  97.6 ?F (36.4 ?C), temperature source Oral, resp. rate  (!) 30, height 5\' 4"  (1.626 m), weight 47.8 kg, SpO2 98 %.  ?Examination:  ?Physical Exam ?Constitutional:   ?   General: He is not in acute distress. ?HENT:  ?   Head: Normocephalic and atraumatic.  ?   Mouth/Throat:  ?   Mouth: Mucous membranes are moist.  ?Eyes:  ?   Extraocular Movements: Extraocular movements intact.  ?Cardiovascular:  ?   Rate and Rhythm: Normal rate and regular rhythm.  ?   Heart sounds: Normal heart sounds.  ?Pulmonary:  ?   Effort: Pulmonary effort is normal. No respiratory distress.  ?   Comments: Coarse breath sounds bilaterally.  No wheezing ?Chest:  ?   Comments: Tenderness over right nephrostomy tube site ?Abdominal:  ?   General: Bowel sounds are normal. There is no distension.  ?   Palpations: Abdomen is soft.  ?   Tenderness: There is no abdominal tenderness.  ?Musculoskeletal:     ?   General: Normal range of motion.  ?   Cervical back: Normal range of motion and neck supple.  ?Skin: ?   General: Skin is warm and dry.  ?Neurological:  ?   General: No focal deficit present.  ?   Mental Status: He is alert.  ?Psychiatric:     ?   Mood and Affect: Mood normal.     ?   Behavior: Behavior normal.  ?  ? ?Consultants:  ?Nephrology ?IR ? ?Procedures:  ? ? ?Data Reviewed: ?Results for orders placed or performed during the hospital encounter of 08/30/21 (from the past 24 hour(s))  ?Culture, blood (routine x 2)     Status: None (Preliminary result)  ? Collection Time: 08/30/21 11:47 PM  ? Specimen: BLOOD  ?Result Value Ref Range  ? Specimen Description BLOOD RIGHT FOREARM   ? Special Requests    ?  BOTTLES DRAWN AEROBIC AND ANAEROBIC Blood Culture adequate volume  ? Culture    ?  NO GROWTH < 12 HOURS ?Performed at [facility], [address] [place], [place] [address] ?  ? Report Status PENDING   ?Renal function panel     Status: Abnormal  ? Collection Time: 08/31/21  3:24 AM  ?Result Value Ref Range  ? Sodium 138 135 - 145 mmol/L  ? Potassium 5.8 (H) 3.5 - 5.1 mmol/L  ? Chloride  105 98 - 111 mmol/L  ? CO2 18 (L) 22 - 32 mmol/L  ? Glucose, Bld 94 70 - 99 mg/dL  ? BUN 89 (H) 6 - 20 mg/dL  ? Creatinine, Ser 10.43 (H) 0.61 - 1.24 mg/dL  ? Calcium 7.7 (L) 8.9 - 10.3 mg/dL  ? Phosphorus 8.4 (H) 2.5 - 4.6 mg/dL  ? Albumin 2.4 (L) 3.5 - 5.0 g/dL  ? GFR, Estimated 5 (L) >60 mL/min  ? Anion gap 15 5 - 15  ?Procalcitonin - Baseline     Status: None  ? Collection Time: 08/31/21  3:24 AM  ?Result Value Ref Range  ? Procalcitonin 1.87 ng/mL  ?CBC     Status: Abnormal  ? Collection Time: 08/31/21  3:24 AM  ?Result Value Ref Range  ? WBC 8.3 4.0 - 10.5 K/uL  ? RBC 3.26 (L) 4.22 - 5.81 MIL/uL  ? Hemoglobin 9.8 (L) 13.0 - 17.0 g/dL  ? HCT 31.6 (L) 39.0 - 52.0 %  ? MCV 96.9 80.0 - 100.0 fL  ? MCH 30.1 26.0 - 34.0 pg  ?  MCHC 31.0 30.0 - 36.0 g/dL  ? RDW 16.5 (H) 11.5 - 15.5 %  ? Platelets 185 150 - 400 K/uL  ? nRBC 0.0 0.0 - 0.2 %  ?Magnesium     Status: None  ? Collection Time: 08/31/21  3:24 AM  ?Result Value Ref Range  ? Magnesium 2.1 1.7 - 2.4 mg/dL  ?Hepatitis B surface antigen     Status: None  ? Collection Time: 08/31/21  3:24 AM  ?Result Value Ref Range  ? Hepatitis B Surface Ag NON REACTIVE NON REACTIVE  ?Hepatitis B surface antibody,qualitative     Status: None  ? Collection Time: 08/31/21  3:24 AM  ?Result Value Ref Range  ? Hep B S Ab NON REACTIVE NON REACTIVE  ?Type and screen     Status: None  ? Collection Time: 08/31/21  9:19 AM  ?Result Value Ref Range  ? ABO/RH(D) B POS   ? Antibody Screen NEG   ? Sample Expiration    ?  09/03/2021,2359 ?Performed at [facility], [address] [place], [place] [address] ?  ?  ?I have Reviewed nursing notes, Vitals, and Lab results since pt's last encounter. Pertinent lab results : see above ?I have ordered test including BMP, CBC, Mg ?I have reviewed the last note from staff over past 24 hours ?I have discussed pt's care plan and test results with nursing staff, case manager ? ? LOS: 1 day  ? ?Dwyane Dee, MD ?[facility] ?08/31/2021, 2:48  PM ? ?

## 2021-08-31 NOTE — Progress Notes (Signed)
OT Cancellation Note ? ?Patient Details ?Name: Larry Morrison ?MRN: [id number] ?DOB: 05-05-1971 ? ? ?Cancelled Treatment:    Reason Eval/Treat Not Completed: Other (comment);Medical issues which prohibited therapy. Per chart review, pt is currently in room receiving dialysis , potassium is 5.8, and also documented that pt is currently on Bi-Pap. OT to hold at this time and will re-attempt when pt is able to actively participate in OT intervention.  ? ?Darleen Crocker, [place], OTR/L , CBIS ?ascom [phone]  ?08/31/21, 9:10 AM  ?

## 2021-08-31 NOTE — Progress Notes (Signed)
?   08/30/21 2321  ?Assess: MEWS Score  ?Temp 97.7 ?F (36.5 ?C)  ?BP 124/85  ?Pulse Rate 72  ?Resp (!) 29  ?SpO2 96 %  ?O2 Device Room Air  ?Assess: MEWS Score  ?MEWS Temp 0  ?MEWS Systolic 0  ?MEWS Pulse 0  ?MEWS RR 2  ?MEWS LOC 0  ?MEWS Score 2  ?MEWS Score Color Yellow  ?Assess: if the MEWS score is Yellow or Red  ?Were vital signs taken at a resting state? Yes  ?Focused Assessment Change from prior assessment (see assessment flowsheet)  ?Does the patient meet 2 or more of the SIRS criteria? No  ?MEWS guidelines implemented *See Row Information* Yes  ?Treat  ?MEWS Interventions Other (Comment) ?(notified k foust)  ?Pain Scale 0-10  ?Pain Score 0  ?Take Vital Signs  ?Increase Vital Sign Frequency  Yellow: Q 2hr X 2 then Q 4hr X 2, if remains yellow, continue Q 4hrs  ?Escalate  ?MEWS: Escalate Yellow: discuss with charge nurse/RN and consider discussing with provider and RRT  ?Notify: Charge Nurse/RN  ?Name of Charge Nurse/RN Notified money phyllis  ?Date Charge Nurse/RN Notified 08/30/21  ?Time Charge Nurse/RN Notified 2333  ?Notify: Provider  ?Provider Name/Title k foust  ?Date Provider Notified 08/30/21  ?Time Provider Notified 2332  ?Notification Type  ?(secured chat)  ?Notification Reason Change in status  ?Assess: SIRS CRITERIA  ?SIRS Temperature  0  ?SIRS Pulse 0  ?SIRS Respirations  1  ?SIRS WBC 0  ?SIRS Score Sum  1  ? ? ?patient noted to have RR varies from 20's to 40's . Seen and examined by K. Foust trial of cpap started with no improvement. Examined again by K. Foust portable CXR done and ordered transfer to to to 2A to be hooked on bipap.   ?

## 2021-08-31 NOTE — Anesthesia Preprocedure Evaluation (Deleted)
Anesthesia Evaluation    Airway        Dental   Pulmonary Current Smoker,           Cardiovascular hypertension,      Neuro/Psych    GI/Hepatic   Endo/Other    Renal/GU      Musculoskeletal   Abdominal   Peds  Hematology   Anesthesia Other Findings   Reproductive/Obstetrics                             Anesthesia Physical Anesthesia Plan Anesthesia Quick Evaluation  

## 2021-08-31 NOTE — Progress Notes (Signed)
PT Cancellation Note ? ?Patient Details ?Name: Larry Morrison ?MRN: [id number] ?DOB: [birth year] ? ? ?Cancelled Treatment:    Reason Eval/Treat Not Completed: Patient at procedure or test/unavailable ?Pt having in-room dialysis this AM.  Per conversation with his nurse he then has multiple other procedures planned for later in the day, she suggests that today simply won't be a good day to try PT... will maintain on caseload and attempt to see at later date as appropriate. ? ?Kreg Shropshire, DPT ?08/31/2021, 8:56 AM ?

## 2021-08-31 NOTE — Progress Notes (Signed)
?[facility]  ?ROUNDING NOTE  ? ?Subjective:  ? ?Larry Morrison is a 51 year old African-American male with past medical history including pulmonary hypertension, combined CHF, CVA, hypertension, neurogenic bladder with right nephrostomy tube, and end-stage renal disease on hemodialysis.  Patient presents to the emergency department at the recommendation of his brother stating generalized weakness and a missed dialysis treatment.  Patient has been admitted for UTI (urinary tract infection) [N39.0] ?Healthcare-associated pneumonia [J18.9] ?Generalized weakness [R53.1] ? ?Patient is known to our practice and receives outpatient dialysis treatments at [facility] on a MWF schedule, supervised by Dr. Juleen China.   ? ?Patient seen and evaluated during dialysis ?  ?HEMODIALYSIS FLOWSHEET: ? ?Blood Flow Rate (mL/min): 400 mL/min ?Arterial Pressure (mmHg): -200 mmHg ?Venous Pressure (mmHg): 220 mmHg ?Transmembrane Pressure (mmHg): 30 mmHg ?Ultrafiltration Rate (mL/min): 610 mL/min ?Dialysate Flow Rate (mL/min): 500 ml/min ?Conductivity: Machine : 13.6 ?Conductivity: Machine : 13.6 ? ?Patient currently resting quietly, alert ?BiPAP in place ? ?Objective:  ?Vital signs in last 24 hours:  ?Temp:  [97.6 ?F (36.4 ?C)-98.6 ?F (37 ?C)] 97.6 ?F (36.4 ?C) (04/12 1315) ?Pulse Rate:  [70-77] 70 (04/12 0733) ?Resp:  [15-43] 30 (04/12 1331) ?BP: (124-137)/(74-98) 129/76 (04/12 1316) ?SpO2:  [95 %-100 %] 98 % (04/12 1316) ?Weight:  [47.8 kg-49.1 kg] 47.8 kg (04/12 1315) ? ?Weight change:  ?Filed Weights  ? 08/29/21 2339 08/31/21 0949 08/31/21 1315  ?Weight: 54.4 kg 49.1 kg 47.8 kg  ? ? ?Intake/Output: ?I/O last 3 completed shifts: ?In: 337 [P.O.:237; IV Piggyback:100] ?Out: 150 [Urine:150] ?  ?Intake/Output this shift: ? Total I/O ?In: -  ?Out: 1000 [Other:1000] ? ?Physical Exam: ?General: NAD, resting quietly  ?Head: Normocephalic, atraumatic. Moist oral mucosal membranes  ?Eyes: Anicteric  ?Lungs:  CPAP with tachypnea,  clear to auscultation  ?Heart: Regular rate and rhythm  ?Abdomen:  Soft, nontender, nondistended  ?Extremities: No peripheral edema.  ?Neurologic: Nonfocal, moving all four extremities  ?Skin: No lesions  ?Access: Right IJ PermCath  ?Right nephrostomy tube. ? ?Basic Metabolic Panel: ?Recent Labs  ?Lab 08/30/21 ?0403 08/31/21 ?1610  ?NA 140 138  ?K 5.0 5.8*  ?CL 105 105  ?CO2 21* 18*  ?GLUCOSE 86 94  ?BUN 74* 89*  ?CREATININE 9.61* 10.43*  ?CALCIUM 8.0* 7.7*  ?MG 2.0 2.1  ?PHOS  --  8.4*  ? ? ? ?Liver Function Tests: ?Recent Labs  ?Lab 08/30/21 ?0403 08/31/21 ?9604  ?AST 30  --   ?ALT 26  --   ?ALKPHOS 131*  --   ?BILITOT 0.9  --   ?PROT 8.4*  --   ?ALBUMIN 2.6* 2.4*  ? ? ?No results for input(s): LIPASE, AMYLASE in the last 168 hours. ?No results for input(s): AMMONIA in the last 168 hours. ? ?CBC: ?Recent Labs  ?Lab 08/29/21 ?2340 08/30/21 ?5409 08/31/21 ?0324  ?WBC 6.1 6.7 8.3  ?NEUTROABS 4.5  --   --   ?HGB 10.9* 10.7* 9.8*  ?HCT 37.1* 36.8* 31.6*  ?MCV 100.3* 102.8* 96.9  ?PLT 174 156 185  ? ? ? ?Cardiac Enzymes: ?No results for input(s): CKTOTAL, CKMB, CKMBINDEX, TROPONINI in the last 168 hours. ? ?BNP: ?Invalid input(s): POCBNP ? ?CBG: ?No results for input(s): GLUCAP in the last 168 hours. ? ?Microbiology: ?Results for orders placed or performed during the hospital encounter of 08/30/21  ?Resp Panel by RT-PCR (Flu A&B, Covid) Nasopharyngeal Swab     Status: None  ? Collection Time: 08/29/21 11:40 PM  ? Specimen: Nasopharyngeal Swab; Nasopharyngeal(NP) swabs in vial  transport medium  ?Result Value Ref Range Status  ? SARS Coronavirus 2 by RT PCR NEGATIVE NEGATIVE Final  ?  Comment: (NOTE) ?SARS-CoV-2 target nucleic acids are NOT DETECTED. ? ?The SARS-CoV-2 RNA is generally detectable in upper respiratory ?specimens during the acute phase of infection. The lowest ?concentration of SARS-CoV-2 viral copies this assay can detect is ?138 copies/mL. A negative result does not preclude SARS-Cov-2 ?infection and  should not be used as the sole basis for treatment or ?other patient management decisions. A negative result may occur with  ?improper specimen collection/handling, submission of specimen other ?than nasopharyngeal swab, presence of viral mutation(s) within the ?areas targeted by this assay, and inadequate number of viral ?copies(<138 copies/mL). A negative result must be combined with ?clinical observations, patient history, and epidemiological ?information. The expected result is Negative. ? ?Fact Sheet for Patients:  ?EntrepreneurPulse.com.au ? ?Fact Sheet for Healthcare Providers:  ?IncredibleEmployment.be ? ?This test is no t yet approved or cleared by the [place] FDA and  ?has been authorized for detection and/or diagnosis of SARS-CoV-2 by ?FDA under an Emergency Use Authorization (EUA). This EUA will remain  ?in effect (meaning this test can be used) for the duration of the ?COVID-19 declaration under Section 564(b)(1) of the Act, 21 ?U.S.C.section 360bbb-3(b)(1), unless the authorization is terminated  ?or revoked sooner.  ? ? ?  ? Influenza A by PCR NEGATIVE NEGATIVE Final  ? Influenza B by PCR NEGATIVE NEGATIVE Final  ?  Comment: (NOTE) ?The Xpert Xpress SARS-CoV-2/FLU/RSV plus assay is intended as an aid ?in the diagnosis of influenza from Nasopharyngeal swab specimens and ?should not be used as a sole basis for treatment. Nasal washings and ?aspirates are unacceptable for Xpert Xpress SARS-CoV-2/FLU/RSV ?testing. ? ?Fact Sheet for Patients: ?EntrepreneurPulse.com.au ? ?Fact Sheet for Healthcare Providers: ?IncredibleEmployment.be ? ?This test is not yet approved or cleared by the [place] FDA and ?has been authorized for detection and/or diagnosis of SARS-CoV-2 by ?FDA under an Emergency Use Authorization (EUA). This EUA will remain ?in effect (meaning this test can be used) for the duration of the ?COVID-19 declaration  under Section 564(b)(1) of the Act, 21 U.S.C. ?section 360bbb-3(b)(1), unless the authorization is terminated or ?revoked. ? ?Performed at [facility], [place], ?[place] [address] ?  ?Urine Culture     Status: Abnormal  ? Collection Time: 08/30/21  2:25 AM  ? Specimen: Urine, Clean Catch  ?Result Value Ref Range Status  ? Specimen Description   Final  ?  URINE, CLEAN CATCH ?Performed at [facility], [address] [place], [place] [address] ?  ? Special Requests   Final  ?  NONE ?Performed at [facility], [address] [place], [place] [address] ?  ? Culture (A)  Final  ?  <10,000 COLONIES/mL INSIGNIFICANT GROWTH ?Performed at [facility], [address] [place], [place] [address] ?  ? Report Status 08/31/2021 FINAL  Final  ?Culture, blood (routine x 2)     Status: None (Preliminary result)  ? Collection Time: 08/30/21  2:56 AM  ? Specimen: BLOOD  ?Result Value Ref Range Status  ? Specimen Description BLOOD RIGHT FOREARM  Final  ? Special Requests   Final  ?  BOTTLES DRAWN AEROBIC AND ANAEROBIC Blood Culture adequate volume  ? Culture   Final  ?  NO GROWTH 1 DAY ?Performed at [facility], [address], [place], [place] [address] ?  ? Report Status PENDING  Incomplete  ?  Culture, blood (routine x 2)     Status: None (Preliminary result)  ? Collection Time: 08/30/21 11:47 PM  ? Specimen: BLOOD  ?Result Value Ref Range Status  ? Specimen Description BLOOD RIGHT FOREARM  Final  ? Special Requests   Final  ?  BOTTLES DRAWN AEROBIC AND ANAEROBIC Blood Culture adequate volume  ? Culture   Final  ?  NO GROWTH < 12 HOURS ?Performed at [facility], [address], [place], [place] [address] ?  ? Report Status PENDING  Incomplete  ? ? ?Coagulation Studies: ?No results for input(s): LABPROT, INR in the last 72 hours. ? ?Urinalysis: ?Recent Labs  ?  08/30/21 ?0256  ?COLORURINE YELLOW*  ?LABSPEC 1.026  ?PHURINE 7.0  ?GLUCOSEU 50*  ?HGBUR MODERATE*   ?BILIRUBINUR NEGATIVE  ?KETONESUR NEGATIVE  ?PROTEINUR 100*  ?NITRITE NEGATIVE  ?LEUKOCYTESUR LARGE*  ? ?  ? ? ?Imaging: ?DG Chest 2 View ? ?Result Date: 08/30/2021 ?CLINICAL DATA:  Cough and weakness EXAM: CHES

## 2021-08-31 NOTE — Plan of Care (Signed)
Refused for nasal swab  MRSA pcr.   Patient refused to notify his brother about his current condition. transferred to 2A in room 239 with all his belongings for continuation of care  ?

## 2021-09-01 ENCOUNTER — Inpatient Hospital Stay: Payer: Medicaid Other | Admitting: Radiology

## 2021-09-01 HISTORY — PX: IR NEPHROSTOMY EXCHANGE RIGHT: IMG6070

## 2021-09-01 LAB — CBC WITH DIFFERENTIAL/PLATELET
Abs Immature Granulocytes: 0.02 10*3/uL (ref 0.00–0.07)
Basophils Absolute: 0 10*3/uL (ref 0.0–0.1)
Basophils Relative: 0 %
Eosinophils Absolute: 0.2 10*3/uL (ref 0.0–0.5)
Eosinophils Relative: 4 %
HCT: 29 % — ABNORMAL LOW (ref 39.0–52.0)
Hemoglobin: 9 g/dL — ABNORMAL LOW (ref 13.0–17.0)
Immature Granulocytes: 0 %
Lymphocytes Relative: 19 %
Lymphs Abs: 0.9 10*3/uL (ref 0.7–4.0)
MCH: 29.9 pg (ref 26.0–34.0)
MCHC: 31 g/dL (ref 30.0–36.0)
MCV: 96.3 fL (ref 80.0–100.0)
Monocytes Absolute: 0.4 10*3/uL (ref 0.1–1.0)
Monocytes Relative: 8 %
Neutro Abs: 3.5 10*3/uL (ref 1.7–7.7)
Neutrophils Relative %: 69 %
Platelets: 165 10*3/uL (ref 150–400)
RBC: 3.01 MIL/uL — ABNORMAL LOW (ref 4.22–5.81)
RDW: 16.4 % — ABNORMAL HIGH (ref 11.5–15.5)
WBC: 5.1 10*3/uL (ref 4.0–10.5)
nRBC: 0 % (ref 0.0–0.2)

## 2021-09-01 LAB — RENAL FUNCTION PANEL
Albumin: 2.2 g/dL — ABNORMAL LOW (ref 3.5–5.0)
Anion gap: 10 (ref 5–15)
BUN: 44 mg/dL — ABNORMAL HIGH (ref 6–20)
CO2: 25 mmol/L (ref 22–32)
Calcium: 7.3 mg/dL — ABNORMAL LOW (ref 8.9–10.3)
Chloride: 100 mmol/L (ref 98–111)
Creatinine, Ser: 6.07 mg/dL — ABNORMAL HIGH (ref 0.61–1.24)
GFR, Estimated: 10 mL/min — ABNORMAL LOW (ref >60)
Glucose, Bld: 88 mg/dL (ref 70–99)
Phosphorus: 5.6 mg/dL — ABNORMAL HIGH (ref 2.5–4.6)
Potassium: 3.7 mmol/L (ref 3.5–5.1)
Sodium: 135 mmol/L (ref 135–145)

## 2021-09-01 LAB — MAGNESIUM: Magnesium: 1.7 mg/dL (ref 1.7–2.4)

## 2021-09-01 MED ORDER — LIDOCAINE HCL (PF) 1 % IJ SOLN
INTRAMUSCULAR | Status: AC
  Administered 2021-09-01: 5 mL via INTRADERMAL
  Filled 2021-09-01: qty 30

## 2021-09-01 MED ORDER — AMOXICILLIN-POT CLAVULANATE 875-125 MG PO TABS: 1.0000 | ORAL_TABLET | Freq: Two times a day (BID) | ORAL | Status: DC

## 2021-09-01 MED ORDER — IOHEXOL 300 MG/ML  SOLN
8.0000 mL | Freq: Once | INTRAMUSCULAR | Status: AC | PRN
  Administered 2021-09-01: 8 mL

## 2021-09-01 MED ORDER — ACETAMINOPHEN 500 MG PO TABS
1000.0000 mg | ORAL_TABLET | Freq: Four times a day (QID) | ORAL | Status: DC | PRN
Start: 2021-09-01 — End: 2021-09-03
  Administered 2021-09-01 – 2021-09-02 (×2): 1000 mg via ORAL
  Filled 2021-09-01 (×2): qty 2

## 2021-09-01 MED ORDER — AMOXICILLIN-POT CLAVULANATE 500-125 MG PO TABS
1.0000 | ORAL_TABLET | Freq: Two times a day (BID) | ORAL | Status: DC
Start: 2021-09-01 — End: 2021-09-03
  Administered 2021-09-01 – 2021-09-03 (×4): 500 mg via ORAL
  Filled 2021-09-01 (×4): qty 1

## 2021-09-01 NOTE — Progress Notes (Addendum)
PT Cancellation Note ? ?Patient Details ?Name: Larry Morrison ?MRN: [id number] ?DOB: 1971/01/01 ? ? ?Cancelled Treatment:    Reason Eval/Treat Not Completed: Patient declined, no reason specified ?Chart reviewed, entered room to attempt PT eval.  _)Pt laying in bed in no acute distress, interacted appropriately but flatly refused to do anything with PT today.  He cited feeling tired, no eating and kept repeating "I'm not doing anything" despite repeated encouragement and rationalization from this PT.  Will continue to try to see with the hope that pt is feeling better and/or more willing to do some mobility/activity as appropriate.   ? ?Addendum: attempted to see pt again today ~16:30, he was laying in bed in no acute distress.  Offered and encouraged to do some activity/exercises/get to recliner/etc all of which were brushed off with some variation of "I told you ain't doing sh!+ today man" and he states "and I don't want none of y'all to come by tomorrow either."  Unsure how willing Larry Morrison will be to work with PT going forward, discussed how if nothing else we'd like to insure he is safely able to return home and he states his brother can and will help him and that he'll be fine... ? ?Kreg Shropshire, DPT ?09/01/2021, 9:36 AM ?

## 2021-09-01 NOTE — Procedures (Signed)
Interventional Radiology Procedure Note ? ?Procedure: Right PCN exchange ? ?Indication: Chronic right PCN, due for exchange ? ?Findings: Please refer to procedural dictation for full description. ? ?Complications: None ? ?EBL: < 10 mL ? ?Miachel Roux, MD ?[phone] ? ? ?

## 2021-09-01 NOTE — Progress Notes (Signed)
?[facility]  ?ROUNDING NOTE  ? ?Subjective:  ? ?Larry Morrison is a 51 year old African-American male with past medical history including pulmonary hypertension, combined CHF, CVA, hypertension, neurogenic bladder with right nephrostomy tube, and end-stage renal disease on hemodialysis.  Patient presents to the emergency department at the recommendation of his brother stating generalized weakness and a missed dialysis treatment.  Patient has been admitted for UTI (urinary tract infection) [N39.0] ?Healthcare-associated pneumonia [J18.9] ?Generalized weakness [R53.1] ? ?Patient is known to our practice and receives outpatient dialysis treatments at [facility] on a MWF schedule, supervised by Dr. Juleen China.   ? ?Uptodate: ?Patient underwent HD yesterday, tolerated well. Due for PCN change today.  ?Will be due for HD again tomorrow.  ? ? ? ?Objective:  ?Vital signs in last 24 hours:  ?Temp:  [97.6 ?F (36.4 ?C)-98.2 ?F (36.8 ?C)] 97.9 ?F (36.6 ?C) (04/13 1517) ?Pulse Rate:  [62-80] 63 (04/13 1517) ?Resp:  [16-22] 17 (04/13 1517) ?BP: (100-136)/(64-86) 118/75 (04/13 1517) ?SpO2:  [94 %-100 %] 99 % (04/13 1517) ? ?Weight change:  ?Filed Weights  ? 08/29/21 2339 08/31/21 0949 08/31/21 1315  ?Weight: 54.4 kg 49.1 kg 47.8 kg  ? ? ?Intake/Output: ?I/O last 3 completed shifts: ?In: 2585 [P.O.:1613; IV Piggyback:200] ?Out: 2778 [Urine:275; Other:1000] ?  ?Intake/Output this shift: ? No intake/output data recorded. ? ?Physical Exam: ?General: NAD, resting quietly  ?Head: Normocephalic, atraumatic. Moist oral mucosal membranes  ?Eyes: Anicteric  ?Lungs:  Clear bilateral, normal effort  ?Heart: Regular rate and rhythm  ?Abdomen:  Soft, nontender, nondistended  ?Extremities: No peripheral edema.  ?Neurologic: Nonfocal, moving all four extremities  ?Skin: No lesions  ?Access: Right IJ PermCath  ?Right nephrostomy tube. ? ?Basic Metabolic Panel: ?Recent Labs  ?Lab 08/30/21 ?0403 08/31/21 ?0324 09/01/21 ?2423  ?NA 140  138 135  ?K 5.0 5.8* 3.7  ?CL 105 105 100  ?CO2 21* 18* 25  ?GLUCOSE 86 94 88  ?BUN 74* 89* 44*  ?CREATININE 9.61* 10.43* 6.07*  ?CALCIUM 8.0* 7.7* 7.3*  ?MG 2.0 2.1 1.7  ?PHOS  --  8.4* 5.6*  ? ? ? ?Liver Function Tests: ?Recent Labs  ?Lab 08/30/21 ?0403 08/31/21 ?0324 09/01/21 ?5361  ?AST 30  --   --   ?ALT 26  --   --   ?ALKPHOS 131*  --   --   ?BILITOT 0.9  --   --   ?PROT 8.4*  --   --   ?ALBUMIN 2.6* 2.4* 2.2*  ? ? ?No results for input(s): LIPASE, AMYLASE in the last 168 hours. ?No results for input(s): AMMONIA in the last 168 hours. ? ?CBC: ?Recent Labs  ?Lab 08/29/21 ?2340 08/30/21 ?4431 08/31/21 ?5400 09/01/21 ?8676  ?WBC 6.1 6.7 8.3 5.1  ?NEUTROABS 4.5  --   --  3.5  ?HGB 10.9* 10.7* 9.8* 9.0*  ?HCT 37.1* 36.8* 31.6* 29.0*  ?MCV 100.3* 102.8* 96.9 96.3  ?PLT 174 156 185 165  ? ? ? ?Cardiac Enzymes: ?No results for input(s): CKTOTAL, CKMB, CKMBINDEX, TROPONINI in the last 168 hours. ? ?BNP: ?Invalid input(s): POCBNP ? ?CBG: ?No results for input(s): GLUCAP in the last 168 hours. ? ?Microbiology: ?Results for orders placed or performed during the hospital encounter of 08/30/21  ?Resp Panel by RT-PCR (Flu A&B, Covid) Nasopharyngeal Swab     Status: None  ? Collection Time: 08/29/21 11:40 PM  ? Specimen: Nasopharyngeal Swab; Nasopharyngeal(NP) swabs in vial transport medium  ?Result Value Ref Range Status  ? SARS Coronavirus 2  by RT PCR NEGATIVE NEGATIVE Final  ?  Comment: (NOTE) ?SARS-CoV-2 target nucleic acids are NOT DETECTED. ? ?The SARS-CoV-2 RNA is generally detectable in upper respiratory ?specimens during the acute phase of infection. The lowest ?concentration of SARS-CoV-2 viral copies this assay can detect is ?138 copies/mL. A negative result does not preclude SARS-Cov-2 ?infection and should not be used as the sole basis for treatment or ?other patient management decisions. A negative result may occur with  ?improper specimen collection/handling, submission of specimen other ?than nasopharyngeal  swab, presence of viral mutation(s) within the ?areas targeted by this assay, and inadequate number of viral ?copies(<138 copies/mL). A negative result must be combined with ?clinical observations, patient history, and epidemiological ?information. The expected result is Negative. ? ?Fact Sheet for Patients:  ?EntrepreneurPulse.com.au ? ?Fact Sheet for Healthcare Providers:  ?IncredibleEmployment.be ? ?This test is no t yet approved or cleared by the [place] FDA and  ?has been authorized for detection and/or diagnosis of SARS-CoV-2 by ?FDA under an Emergency Use Authorization (EUA). This EUA will remain  ?in effect (meaning this test can be used) for the duration of the ?COVID-19 declaration under Section 564(b)(1) of the Act, 21 ?U.S.C.section 360bbb-3(b)(1), unless the authorization is terminated  ?or revoked sooner.  ? ? ?  ? Influenza A by PCR NEGATIVE NEGATIVE Final  ? Influenza B by PCR NEGATIVE NEGATIVE Final  ?  Comment: (NOTE) ?The Xpert Xpress SARS-CoV-2/FLU/RSV plus assay is intended as an aid ?in the diagnosis of influenza from Nasopharyngeal swab specimens and ?should not be used as a sole basis for treatment. Nasal washings and ?aspirates are unacceptable for Xpert Xpress SARS-CoV-2/FLU/RSV ?testing. ? ?Fact Sheet for Patients: ?EntrepreneurPulse.com.au ? ?Fact Sheet for Healthcare Providers: ?IncredibleEmployment.be ? ?This test is not yet approved or cleared by the [place] FDA and ?has been authorized for detection and/or diagnosis of SARS-CoV-2 by ?FDA under an Emergency Use Authorization (EUA). This EUA will remain ?in effect (meaning this test can be used) for the duration of the ?COVID-19 declaration under Section 564(b)(1) of the Act, 21 U.S.C. ?section 360bbb-3(b)(1), unless the authorization is terminated or ?revoked. ? ?Performed at [facility], [place], ?[place] [address] ?  ?Urine  Culture     Status: Abnormal  ? Collection Time: 08/30/21  2:25 AM  ? Specimen: Urine, Clean Catch  ?Result Value Ref Range Status  ? Specimen Description   Final  ?  URINE, CLEAN CATCH ?Performed at [facility], [address], [place], [place] [address] ?  ? Special Requests   Final  ?  NONE ?Performed at [facility], [address], [place], [place] [address] ?  ? Culture (A)  Final  ?  <10,000 COLONIES/mL INSIGNIFICANT GROWTH ?Performed at [facility], [place] [address], [place], [place] [address] ?  ? Report Status 08/31/2021 FINAL  Final  ?Culture, blood (routine x 2)     Status: None (Preliminary result)  ? Collection Time: 08/30/21  2:56 AM  ? Specimen: BLOOD  ?Result Value Ref Range Status  ? Specimen Description BLOOD RIGHT FOREARM  Final  ? Special Requests   Final  ?  BOTTLES DRAWN AEROBIC AND ANAEROBIC Blood Culture adequate volume  ? Culture   Final  ?  NO GROWTH 1 DAY ?Performed at [facility], [address], [place], [place] [address] ?  ? Report Status PENDING  Incomplete  ?Culture, blood (routine x 2)     Status: None (Preliminary  result)  ? Collection Time: 08/30/21 11:47 PM  ? Specimen: BLOOD  ?Result Value Ref Range Status  ? Specimen Description BLOOD RIGHT FOREARM  Final  ? Special Requests   Final  ?  BOTTLES DRAWN AEROBIC AND ANAEROBIC Blood Culture adequate volume  ? Culture   Final  ?  NO GROWTH < 12 HOURS ?Performed at [facility], [address], [place], [place] [address] ?  ? Report Status PENDING  Incomplete  ? ? ?Coagulation Studies: ?No results for input(s): LABPROT, INR in the last 72 hours. ? ?Urinalysis: ?Recent Labs  ?  08/30/21 ?0256  ?COLORURINE YELLOW*  ?LABSPEC 1.026  ?PHURINE 7.0  ?GLUCOSEU 50*  ?HGBUR MODERATE*  ?BILIRUBINUR NEGATIVE  ?KETONESUR NEGATIVE  ?PROTEINUR 100*  ?NITRITE NEGATIVE  ?LEUKOCYTESUR LARGE*  ? ?  ? ? ?Imaging: ?DG Chest Port 1 View ? ?Result Date: 08/31/2021 ?CLINICAL DATA:  Tachypnea EXAM: PORTABLE CHEST  1 VIEW COMPARISON:  08/30/2021 FINDINGS: Cardiac shadow is enlarged but stable. Dialysis catheter is again seen. Right arm stent is noted covering the axillary and subclavian veins. Bilateral pleural ef

## 2021-09-01 NOTE — Evaluation (Signed)
Occupational Therapy Evaluation ?Patient Details ?Name: Larry Morrison ?MRN: [id number] ?DOB: 1971/03/07 ?Today's Date: 09/01/2021 ? ? ?History of Present Illness 51 year old African-American male with past medical history including pulmonary hypertension, combined CHF, CVA, hypertension, neurogenic bladder with right nephrostomy tube, and end-stage renal disease on hemodialysis.  Patient presents to the emergency department at the recommendation of his brother stating generalized weakness and a missed dialysis treatment.  Patient has been admitted for UTI, PNA, and weakness.  ? ?Clinical Impression ?  ?Upon entering the room, pt supine in bed and agreeable to OT evaluation. Pt reports living in a level entry apartment with brother. His brother assists him with ADLs(basin bath from bed level), IADLs, and assists him with transfers into bed. Pt reports he sometimes also needs assist with wheelchair propulsion. He remains in wheelchair for several hours during the day. Pt demonstrates strength, ROM, and rolls L <> R without assist in bed. Pt declines EOB and functional transfer this session. He endorses being at his baseline and pleasantly declines further OT intervention this admission. OT to SIGN OFF. ?   ? ?Recommendations for follow up therapy are one component of a multi-disciplinary discharge planning process, led by the attending physician.  Recommendations may be updated based on patient status, additional functional criteria and insurance authorization.  ? ?Follow Up Recommendations ? No OT follow up  ?  ?Assistance Recommended at Discharge Frequent or constant Supervision/Assistance  ?Patient can return home with the following A lot of help with walking and/or transfers;A lot of help with bathing/dressing/bathroom;Assistance with cooking/housework;Assist for transportation ? ?  ?   ?Equipment Recommendations ? None recommended by OT  ?  ?   ?Precautions / Restrictions Precautions ?Precautions: Fall  ? ?   ? ?Mobility Bed Mobility ?Overal bed mobility: Needs Assistance ?Bed Mobility: Rolling ?Rolling: Supervision ?  ?  ?  ?  ?General bed mobility comments: no physical assist to roll. Pt refuses EOB ?  ? ?Transfers ?  ?  ?  ?  ?  ?  ?  ?  ?  ?General transfer comment: Pt refuses transfer ?  ? ?  ?   ? ?ADL either performed or assessed with clinical judgement  ? ?ADL Overall ADL's : At baseline ?  ?  ?  ?  ?  ?  ?  ?  ?  ?  ?  ?  ?  ?  ?  ?  ?  ?  ?  ?   ? ? ? ?Vision Patient Visual Report: No change from baseline ?   ?   ?   ?   ? ?Pertinent Vitals/Pain Pain Assessment ?Pain Assessment: No/denies pain  ? ? ? ?Hand Dominance Right ?  ?Extremity/Trunk Assessment Upper Extremity Assessment ?Upper Extremity Assessment: Generalized weakness;Overall [facility] for tasks assessed ?  ?Lower Extremity Assessment ?Lower Extremity Assessment: Defer to PT evaluation ?  ?  ?  ?Communication Communication ?Communication: No difficulties ?  ?Cognition Arousal/Alertness: Awake/alert ?Behavior During Therapy: [facility] for tasks assessed/performed ?Overall Cognitive Status: Within Functional Limits for tasks assessed ?  ?  ?  ?  ?  ?  ?  ?  ?  ?  ?  ?  ?  ?  ?  ?  ?  ?  ?  ?   ?   ?   ? ? ?Home Living Family/patient expects to be discharged to:: Private residence ?Living Arrangements: Other relatives (brother) ?Available Help at Discharge: Family;Available 24 hours/day ?Type of Home: Apartment ?  Home Access: Level entry ?  ?  ?Home Layout: One level ?  ?  ?Bathroom Shower/Tub: Sponge bathes at baseline ?  ?  ?  ?  ?Home Equipment: Conservation officer, nature (2 wheels);Wheelchair - manual;Shower seat;Grab bars - tub/shower ?  ?  ?  ? ?  ?Prior Functioning/Environment Prior Level of Function : Needs assist ?  ?  ?  ?  ?  ?  ?Mobility Comments: Pt reports brother transfers him to wheelchair where he stays for several house. He reports being able to propel himself but needing ocassional assist. ?ADLs Comments: Pt reports brother assists him with dressing and  bathing from bed. Brother performs all IADLs ?  ? ?  ?  ?   ?   ?   ?OT Goals(Current goals can be found in the care plan section) Acute Rehab OT Goals ?Patient Stated Goal: to go home ?OT Goal Formulation: With patient ?Time For Goal Achievement: 09/01/21 ?Potential to Achieve Goals: Good  ?OT Frequency:   ?  ? ?   ?AM-PAC OT "6 Clicks" Daily Activity     ?Outcome Measure Help from another person eating meals?: None ?Help from another person taking care of personal grooming?: A Little ?Help from another person toileting, which includes using toliet, bedpan, or urinal?: A Lot ?Help from another person bathing (including washing, rinsing, drying)?: A Lot ?Help from another person to put on and taking off regular upper body clothing?: A Little ?Help from another person to put on and taking off regular lower body clothing?: A Lot ?6 Click Score: 16 ?  ?End of Session   ? ?Activity Tolerance: Patient tolerated treatment well ?Patient left: in bed;with call bell/phone within reach;with bed alarm set ? ?   ?              ?Time: 1017-5102 ?OT Time Calculation (min): 12 min ?Charges:  OT General Charges ?$OT Visit: 1 Visit ?OT Evaluation ?$OT Eval Moderate Complexity: 1 Mod ? ?Darleen Crocker, MS, OTR/L , CBIS ?ascom [phone]  ?09/01/21, 1:10 PM  ?

## 2021-09-01 NOTE — Progress Notes (Signed)
?Progress Note ? ? ? ?Larry Morrison   ?WVP:[id number]  ?DOB: June 29, 1970  ?DOA: 08/30/2021     2 ?PCP: [facility] [place] ? ?Initial CC: Weakness ? ?Hospital Course: ?Larry Morrison is a 51 yo male with PMH ESRD on HD MWF, HTN, neurogenic bladder with chronic right nephrostomy tube, pulmonary HTN, chronic systolic/diastolic CHF, CVA, recurrent UTIs wheelchair dependent who presented with generalized weakness and missing HD session on 4/10 due to the weakness. He had some reported cough too on admission with sputum production. ?There was concern for infection on presentation and he underwent CT chest/abdomen/pelvis.  This showed stable areas of marked severity left lower lobe and inferolateral left upper lobe consolidation and patchy airspace disease.  Stable bilateral pleural effusions partially loculated on the right. Also noted atrophic right kidney s/p nephrostomy tube, diffuse urinary bladder wall thickening. ?He had mildly elevated lactic acid, 2.1 and elevated procalcitonin 1.92.  He was started on vancomycin and cefepime and admitted for further infectious work-up. ? ?Interval History:  ?Resting in bed this morning.  He was more awake and alert than yesterday.  He endorses that his cough and breathing has improved some.  He tentative plan is for nephrostomy tube exchange today. ?Hopefully can go home tomorrow after dialysis. ? ?Assessment and Plan: ?* UTI (urinary tract infection) ?- Chronic right nephrostomy tube with what appears to be poor maintenance and poor hygiene in general.  Dressing out of place and sutures also removed with concern for malposition of tube.  Patient unable to provide maintenance details of catheter care as well ?-CT also shows diffuse urinary bladder wall thickening consistent with either chronic bladder outlet obstruction versus cystitis ?-Suspect some component of colonization given chronicity however given significant weakness on admission, there is still concern for possible  underlying infectious etiology; also has a known history of MRSA UTIs ?-Continue vancomycin and cefepime and follow-up all cultures ?-IR consulted for nephrostomy tube exchange; able to be performed on 09/01/2021 ?- Antibiotics can now be escalated after tube exchange ? ?Loculated pleural effusion ?- Per CT chest, shows stable bilateral pleural effusions with partially loculated right-sided effusion ?- Continue antibiotics ?-Respiratory status and cough seem to be improved since admission.  He remains afebrile, no leukocytosis ?- Will complete antibiotic course ? ?ESRD (end stage renal disease) (Larry Morrison) ?- History of noncompliance.  Missed 08/29/2021 dialysis session due to his weakness ?-Nephrology aware of hospitalization ?- Continue dialysis plan per nephrology ? ?Generalized weakness ?- Appears to be wheelchair bound at baseline ?- monitor weakness response with infection treatment and workup ? ?Prolonged QT interval ?- admission EKG noted with QTc>520 ?- monitor K and Mg ?- avoiding prolonging agents as able  ? ?Chronic combined systolic and diastolic heart failure (Larry Morrison) ?- No signs of exacerbation at this time ?- Last echo October 2022: EF less than 54%, grade 2 diastolic dysfunction ? ?Protein-calorie malnutrition, severe ?- Patient?s BMI is Body mass index is 20.6 kg/m?Marland Kitchen. ?- Patient has the following signs/symptoms consistent with PCM: (fat loss, muscle loss, muscle wasting) ?- continue renal diet ? ?Anemia in ESRD (end-stage renal disease) (Larry Morrison) ?- Baseline hemoglobin approximately 9 to 10 g/dL.  Currently at baseline ? ?Essential hypertension ?- Continue Coreg and Avapro ? ? ?Old records reviewed in assessment of this patient ? ?Antimicrobials: ?Cefepime 08/30/2021 >> 4/13 ?Vancomycin 08/30/2021 >> 4/13 ?Augmentin 4/13 >> current  ? ?DVT prophylaxis:  ?SCD's Start: 08/31/21 0813 ?heparin injection 5,000 Units Start: 08/30/21 0600 ? ? ?Code Status:   Code Status: Full  Code ? ?Disposition Plan: Home Friday  ?Status  is: Inpatient ? ?Objective: ?Blood pressure 118/75, pulse 63, temperature 97.9 ?F (36.6 ?C), temperature source Oral, resp. rate 17, height 5\' 4"  (1.626 m), weight 47.8 kg, SpO2 99 %.  ?Examination:  ?Physical Exam ?Constitutional:   ?   General: He is not in acute distress. ?HENT:  ?   Head: Normocephalic and atraumatic.  ?   Mouth/Throat:  ?   Mouth: Mucous membranes are moist.  ?Eyes:  ?   Extraocular Movements: Extraocular movements intact.  ?Cardiovascular:  ?   Rate and Rhythm: Normal rate and regular rhythm.  ?   Heart sounds: Normal heart sounds.  ?Pulmonary:  ?   Effort: Pulmonary effort is normal. No respiratory distress.  ?   Comments: Coarse breath sounds bilaterally.  No wheezing ?Chest:  ?   Comments: Tenderness over right nephrostomy tube site ?Abdominal:  ?   General: Bowel sounds are normal. There is no distension.  ?   Palpations: Abdomen is soft.  ?   Tenderness: There is no abdominal tenderness.  ?Musculoskeletal:     ?   General: Normal range of motion.  ?   Cervical back: Normal range of motion and neck supple.  ?Skin: ?   General: Skin is warm and dry.  ?Neurological:  ?   General: No focal deficit present.  ?   Mental Status: He is alert.  ?Psychiatric:     ?   Mood and Affect: Mood normal.     ?   Behavior: Behavior normal.  ?  ? ?Consultants:  ?Nephrology ?IR ? ?Procedures:  ?R PCN exchange, 09/01/21 ? ?Data Reviewed: ?Results for orders placed or performed during the hospital encounter of 08/30/21 (from the past 24 hour(s))  ?Magnesium     Status: None  ? Collection Time: 09/01/21  6:28 AM  ?Result Value Ref Range  ? Magnesium 1.7 1.7 - 2.4 mg/dL  ?Renal function panel     Status: Abnormal  ? Collection Time: 09/01/21  6:28 AM  ?Result Value Ref Range  ? Sodium 135 135 - 145 mmol/L  ? Potassium 3.7 3.5 - 5.1 mmol/L  ? Chloride 100 98 - 111 mmol/L  ? CO2 25 22 - 32 mmol/L  ? Glucose, Bld 88 70 - 99 mg/dL  ? BUN 44 (H) 6 - 20 mg/dL  ? Creatinine, Ser 6.07 (H) 0.61 - 1.24 mg/dL  ? Calcium 7.3  (L) 8.9 - 10.3 mg/dL  ? Phosphorus 5.6 (H) 2.5 - 4.6 mg/dL  ? Albumin 2.2 (L) 3.5 - 5.0 g/dL  ? GFR, Estimated 10 (L) >60 mL/min  ? Anion gap 10 5 - 15  ?CBC with Differential/Platelet     Status: Abnormal  ? Collection Time: 09/01/21  6:28 AM  ?Result Value Ref Range  ? WBC 5.1 4.0 - 10.5 K/uL  ? RBC 3.01 (L) 4.22 - 5.81 MIL/uL  ? Hemoglobin 9.0 (L) 13.0 - 17.0 g/dL  ? HCT 29.0 (L) 39.0 - 52.0 %  ? MCV 96.3 80.0 - 100.0 fL  ? MCH 29.9 26.0 - 34.0 pg  ? MCHC 31.0 30.0 - 36.0 g/dL  ? RDW 16.4 (H) 11.5 - 15.5 %  ? Platelets 165 150 - 400 K/uL  ? nRBC 0.0 0.0 - 0.2 %  ? Neutrophils Relative % 69 %  ? Neutro Abs 3.5 1.7 - 7.7 K/uL  ? Lymphocytes Relative 19 %  ? Lymphs Abs 0.9 0.7 - 4.0 K/uL  ? Monocytes Relative  8 %  ? Monocytes Absolute 0.4 0.1 - 1.0 K/uL  ? Eosinophils Relative 4 %  ? Eosinophils Absolute 0.2 0.0 - 0.5 K/uL  ? Basophils Relative 0 %  ? Basophils Absolute 0.0 0.0 - 0.1 K/uL  ? Immature Granulocytes 0 %  ? Abs Immature Granulocytes 0.02 0.00 - 0.07 K/uL  ?  ?I have Reviewed nursing notes, Vitals, and Lab results since pt's last encounter. Pertinent lab results : see above ?I have ordered test including BMP, CBC, Mg ?I have reviewed the last note from staff over past 24 hours ?I have discussed pt's care plan and test results with nursing staff, case manager ? ? LOS: 2 days  ? ?Dwyane Dee, MD ?[facility] ?09/01/2021, 3:49 PM ? ?

## 2021-09-02 LAB — CBC WITH DIFFERENTIAL/PLATELET
Abs Immature Granulocytes: 0.02 10*3/uL (ref 0.00–0.07)
Basophils Absolute: 0 10*3/uL (ref 0.0–0.1)
Basophils Relative: 1 %
Eosinophils Absolute: 0.2 10*3/uL (ref 0.0–0.5)
Eosinophils Relative: 4 %
HCT: 29.2 % — ABNORMAL LOW (ref 39.0–52.0)
Hemoglobin: 8.9 g/dL — ABNORMAL LOW (ref 13.0–17.0)
Immature Granulocytes: 0 %
Lymphocytes Relative: 8 %
Lymphs Abs: 0.5 10*3/uL — ABNORMAL LOW (ref 0.7–4.0)
MCH: 29.3 pg (ref 26.0–34.0)
MCHC: 30.5 g/dL (ref 30.0–36.0)
MCV: 96.1 fL (ref 80.0–100.0)
Monocytes Absolute: 0.4 10*3/uL (ref 0.1–1.0)
Monocytes Relative: 7 %
Neutro Abs: 4.7 10*3/uL (ref 1.7–7.7)
Neutrophils Relative %: 80 %
Platelets: 180 10*3/uL (ref 150–400)
RBC: 3.04 MIL/uL — ABNORMAL LOW (ref 4.22–5.81)
RDW: 16.6 % — ABNORMAL HIGH (ref 11.5–15.5)
WBC: 5.9 10*3/uL (ref 4.0–10.5)
nRBC: 0 % (ref 0.0–0.2)

## 2021-09-02 LAB — RENAL FUNCTION PANEL
Albumin: 2.1 g/dL — ABNORMAL LOW (ref 3.5–5.0)
Anion gap: 12 (ref 5–15)
BUN: 61 mg/dL — ABNORMAL HIGH (ref 6–20)
CO2: 25 mmol/L (ref 22–32)
Calcium: 7 mg/dL — ABNORMAL LOW (ref 8.9–10.3)
Chloride: 102 mmol/L (ref 98–111)
Creatinine, Ser: 7.42 mg/dL — ABNORMAL HIGH (ref 0.61–1.24)
GFR, Estimated: 8 mL/min — ABNORMAL LOW (ref >60)
Glucose, Bld: 100 mg/dL — ABNORMAL HIGH (ref 70–99)
Phosphorus: 6.8 mg/dL — ABNORMAL HIGH (ref 2.5–4.6)
Potassium: 4 mmol/L (ref 3.5–5.1)
Sodium: 139 mmol/L (ref 135–145)

## 2021-09-02 LAB — HEPATITIS B SURFACE ANTIBODY, QUANTITATIVE: Hep B S AB Quant (Post): <3.1 m[IU]/mL — ABNORMAL LOW (ref >9.9)

## 2021-09-02 LAB — MAGNESIUM: Magnesium: 1.8 mg/dL (ref 1.7–2.4)

## 2021-09-02 MED ORDER — ONDANSETRON HCL 4 MG PO TABS
8.0000 mg | ORAL_TABLET | Freq: Once | ORAL | Status: AC
  Administered 2021-09-02: 8 mg via ORAL
  Filled 2021-09-02: qty 2

## 2021-09-02 MED ORDER — LORAZEPAM 2 MG/ML IJ SOLN
0.2500 mg | Freq: Once | INTRAMUSCULAR | Status: AC
  Administered 2021-09-02: 0.25 mg via INTRAVENOUS
  Filled 2021-09-02: qty 1

## 2021-09-02 MED ORDER — HEPARIN SODIUM (PORCINE) 1000 UNIT/ML IJ SOLN
INTRAMUSCULAR | Status: AC
Start: 2021-09-02 — End: 2021-09-02
  Administered 2021-09-02: 4200 [IU] via INTRAVENOUS_CENTRAL
  Filled 2021-09-02: qty 10

## 2021-09-02 NOTE — Progress Notes (Signed)
Hemodialysis Post Treatment Note: ? ?Tx date: 09/02/2021 ?Tx time: 3 hours and 30 minutes ?Access:Right CVC ?UF Removed:1.5L ? ?Note: ? ?09:22 HD started. No complaints. ?10:30 pt just ate then vomiited after bp is good. Monitoring closely. ?1045: pt still nauseous, Dr Holley Raring notified.   ?11:09 DR Holley Raring ordered to give ondansetron 8mg   once PO- given (see MAR) ? ?13:02 HD completed, tolerated well. ASYmptomatic, no complaints ? ? ? ? ? ? ? ? ?  ?

## 2021-09-02 NOTE — Progress Notes (Signed)
PT Cancellation Note ? ?Patient Details ?Name: Larry Morrison ?MRN: [id number] ?DOB: June 17, 1970 ? ? ?Cancelled Treatment:    Reason Eval/Treat Not Completed: Patient at procedure or test/unavailable (Chart reviewed for re-attempt at session.  Off unit for dialysis. Will re-attempt at later time/date as medically appropriate and available.) ? ? ?Manjot Beumer H. Owens Shark, PT, DPT, NCS ?09/02/21, 1:10 PM ?[phone] ? ?

## 2021-09-02 NOTE — Progress Notes (Signed)
Pt. Requesting something for nausea. Sharion Settler, NP paged for orders.  ?

## 2021-09-02 NOTE — Progress Notes (Signed)
?[facility]  ?ROUNDING NOTE  ? ?Subjective:  ? ?Larry Morrison is a 51 year old African-American male with past medical history including pulmonary hypertension, combined CHF, CVA, hypertension, neurogenic bladder with right nephrostomy tube, and end-stage renal disease on hemodialysis.  Patient presents to the emergency department at the recommendation of his brother stating generalized weakness and a missed dialysis treatment.  Patient has been admitted for UTI (urinary tract infection) [N39.0] ?Healthcare-associated pneumonia [J18.9] ?Generalized weakness [R53.1] ? ?Patient is known to our practice and receives outpatient dialysis treatments at [facility] on a MWF schedule, supervised by Dr. Juleen China.   ? ?Uptodate: ?Patient was seen and evaluated during hemodialysis treatment today. ?Had nausea and vomiting earlier in the treatment. ?Was given Zofran. ? ? ? ?Objective:  ?Vital signs in last 24 hours:  ?Temp:  [97.3 ?F (36.3 ?C)-97.9 ?F (36.6 ?C)] 97.6 ?F (36.4 ?C) (04/14 1315) ?Pulse Rate:  [59-69] 64 (04/14 1315) ?Resp:  [13-27] 20 (04/14 1315) ?BP: (118-150)/(72-100) 140/87 (04/14 1315) ?SpO2:  [93 %-100 %] 97 % (04/14 1315) ?Weight:  [43 kg-45 kg] 43 kg (04/14 1315) ? ?Weight change:  ?Filed Weights  ? 08/31/21 1315 09/02/21 0915 09/02/21 1315  ?Weight: 47.8 kg 45 kg 43 kg  ? ? ?Intake/Output: ?I/O last 3 completed shifts: ?In: 1204.1 [P.O.:1020; IV Piggyback:184.1] ?Out: 276 [Urine:275; Emesis/NG output:1] ?  ?Intake/Output this shift: ? Total I/O ?In: -  ?Out: 1500 [Other:1500] ? ?Physical Exam: ?General: NAD, resting quietly  ?Head: Normocephalic, atraumatic. Moist oral mucosal membranes  ?Eyes: Anicteric  ?Lungs:  Clear bilateral, normal effort  ?Heart: Regular rate and rhythm  ?Abdomen:  Soft, nontender, nondistended  ?Extremities: No peripheral edema.  ?Neurologic: Nonfocal, moving all four extremities  ?Skin: No lesions  ?Access: Right IJ PermCath  ?Right nephrostomy tube. ? ?Basic  Metabolic Panel: ?Recent Labs  ?Lab 08/30/21 ?0403 08/31/21 ?0324 09/01/21 ?1610 09/02/21 ?9604  ?NA 140 138 135 139  ?K 5.0 5.8* 3.7 4.0  ?CL 105 105 100 102  ?CO2 21* 18* 25 25  ?GLUCOSE 86 94 88 100*  ?BUN 74* 89* 44* 61*  ?CREATININE 9.61* 10.43* 6.07* 7.42*  ?CALCIUM 8.0* 7.7* 7.3* 7.0*  ?MG 2.0 2.1 1.7 1.8  ?PHOS  --  8.4* 5.6* 6.8*  ? ? ? ?Liver Function Tests: ?Recent Labs  ?Lab 08/30/21 ?0403 08/31/21 ?0324 09/01/21 ?5409 09/02/21 ?8119  ?AST 30  --   --   --   ?ALT 26  --   --   --   ?ALKPHOS 131*  --   --   --   ?BILITOT 0.9  --   --   --   ?PROT 8.4*  --   --   --   ?ALBUMIN 2.6* 2.4* 2.2* 2.1*  ? ? ?No results for input(s): LIPASE, AMYLASE in the last 168 hours. ?No results for input(s): AMMONIA in the last 168 hours. ? ?CBC: ?Recent Labs  ?Lab 08/29/21 ?2340 08/30/21 ?1478 08/31/21 ?2956 09/01/21 ?2130 09/02/21 ?8657  ?WBC 6.1 6.7 8.3 5.1 5.9  ?NEUTROABS 4.5  --   --  3.5 4.7  ?HGB 10.9* 10.7* 9.8* 9.0* 8.9*  ?HCT 37.1* 36.8* 31.6* 29.0* 29.2*  ?MCV 100.3* 102.8* 96.9 96.3 96.1  ?PLT 174 156 185 165 180  ? ? ? ?Cardiac Enzymes: ?No results for input(s): CKTOTAL, CKMB, CKMBINDEX, TROPONINI in the last 168 hours. ? ?BNP: ?Invalid input(s): POCBNP ? ?CBG: ?No results for input(s): GLUCAP in the last 168 hours. ? ?Microbiology: ?Results for orders placed or  performed during the hospital encounter of 08/30/21  ?Resp Panel by RT-PCR (Flu A&B, Covid) Nasopharyngeal Swab     Status: None  ? Collection Time: 08/29/21 11:40 PM  ? Specimen: Nasopharyngeal Swab; Nasopharyngeal(NP) swabs in vial transport medium  ?Result Value Ref Range Status  ? SARS Coronavirus 2 by RT PCR NEGATIVE NEGATIVE Final  ?  Comment: (NOTE) ?SARS-CoV-2 target nucleic acids are NOT DETECTED. ? ?The SARS-CoV-2 RNA is generally detectable in upper respiratory ?specimens during the acute phase of infection. The lowest ?concentration of SARS-CoV-2 viral copies this assay can detect is ?138 copies/mL. A negative result does not preclude  SARS-Cov-2 ?infection and should not be used as the sole basis for treatment or ?other patient management decisions. A negative result may occur with  ?improper specimen collection/handling, submission of specimen other ?than nasopharyngeal swab, presence of viral mutation(s) within the ?areas targeted by this assay, and inadequate number of viral ?copies(<138 copies/mL). A negative result must be combined with ?clinical observations, patient history, and epidemiological ?information. The expected result is Negative. ? ?Fact Sheet for Patients:  ?EntrepreneurPulse.com.au ? ?Fact Sheet for Healthcare Providers:  ?IncredibleEmployment.be ? ?This test is no t yet approved or cleared by the [place] FDA and  ?has been authorized for detection and/or diagnosis of SARS-CoV-2 by ?FDA under an Emergency Use Authorization (EUA). This EUA will remain  ?in effect (meaning this test can be used) for the duration of the ?COVID-19 declaration under Section 564(b)(1) of the Act, 21 ?U.S.C.section 360bbb-3(b)(1), unless the authorization is terminated  ?or revoked sooner.  ? ? ?  ? Influenza A by PCR NEGATIVE NEGATIVE Final  ? Influenza B by PCR NEGATIVE NEGATIVE Final  ?  Comment: (NOTE) ?The Xpert Xpress SARS-CoV-2/FLU/RSV plus assay is intended as an aid ?in the diagnosis of influenza from Nasopharyngeal swab specimens and ?should not be used as a sole basis for treatment. Nasal washings and ?aspirates are unacceptable for Xpert Xpress SARS-CoV-2/FLU/RSV ?testing. ? ?Fact Sheet for Patients: ?EntrepreneurPulse.com.au ? ?Fact Sheet for Healthcare Providers: ?IncredibleEmployment.be ? ?This test is not yet approved or cleared by the [place] FDA and ?has been authorized for detection and/or diagnosis of SARS-CoV-2 by ?FDA under an Emergency Use Authorization (EUA). This EUA will remain ?in effect (meaning this test can be used) for the duration of  the ?COVID-19 declaration under Section 564(b)(1) of the Act, 21 U.S.C. ?section 360bbb-3(b)(1), unless the authorization is terminated or ?revoked. ? ?Performed at [facility], [place], ?[place] [address] ?  ?Urine Culture     Status: Abnormal  ? Collection Time: 08/30/21  2:25 AM  ? Specimen: Urine, Clean Catch  ?Result Value Ref Range Status  ? Specimen Description   Final  ?  URINE, CLEAN CATCH ?Performed at [facility], [address], [place], [place] [address] ?  ? Special Requests   Final  ?  NONE ?Performed at [facility], [address] [place], [place] [address] ?  ? Culture (A)  Final  ?  <10,000 COLONIES/mL INSIGNIFICANT GROWTH ?Performed at [facility], [place] [address] [place], [place] [address] ?  ? Report Status 08/31/2021 FINAL  Final  ?Culture, blood (routine x 2)     Status: None (Preliminary result)  ? Collection Time: 08/30/21  2:56 AM  ? Specimen: BLOOD  ?Result Value Ref Range Status  ? Specimen Description BLOOD RIGHT FOREARM  Final  ? Special Requests   Final  ?  BOTTLES DRAWN AEROBIC  AND ANAEROBIC Blood Culture adequate volume  ? Culture   Final  ?  NO GROWTH 3 DAYS ?Performed at [facility], [address] [place], [place] [address] ?  ? Report Status PENDING  Incomplete  ?Culture, blood (routine x 2)     Status: None (Preliminary result)  ? Collection Time: 08/30/21 11:47 PM  ? Specimen: BLOOD  ?Result Value Ref Range Status  ? Specimen Description BLOOD RIGHT FOREARM  Final  ? Special Requests   Final  ?  BOTTLES DRAWN AEROBIC AND ANAEROBIC Blood Culture adequate volume  ? Culture   Final  ?  NO GROWTH 3 DAYS ?Performed at [facility], [address], [place], [place] [address] ?  ? Report Status PENDING  Incomplete  ? ? ?Coagulation Studies: ?No results for input(s): LABPROT, INR in the last 72 hours. ? ?Urinalysis: ?No results for input(s): COLORURINE, LABSPEC, [place], GLUCOSEU, HGBUR, BILIRUBINUR,  KETONESUR, PROTEINUR, UROBILINOGEN, NITRITE, LEUKOCYTESUR in the last 72 hours. ? ?Invalid input(s): APPERANCEUR ?  ? ? ?Imaging: ?IR NEPHROSTOMY EXCHANGE RIGHT ? ?Result Date: 09/01/2021 ?INDICATION: 51 year old gentlem

## 2021-09-02 NOTE — Progress Notes (Signed)
?Progress Note ? ? ? ?Larry Morrison   ?OYD:[id number]  ?DOB: [birth year]  ?DOA: 08/30/2021     3 ?PCP: [facility] [place] ? ?Initial CC: Weakness ? ?Hospital Course: ?Larry Morrison is a 51 yo male with PMH ESRD on HD MWF, HTN, neurogenic bladder with chronic right nephrostomy tube, pulmonary HTN, chronic systolic/diastolic CHF, CVA, recurrent UTIs wheelchair dependent who presented with generalized weakness and missing HD session on 4/10 due to the weakness. He had some reported cough too on admission with sputum production. ?There was concern for infection on presentation and he underwent CT chest/abdomen/pelvis.  This showed stable areas of marked severity left lower lobe and inferolateral left upper lobe consolidation and patchy airspace disease.  Stable bilateral pleural effusions partially loculated on the right. Also noted atrophic right kidney s/p nephrostomy tube, diffuse urinary bladder wall thickening. ?He had mildly elevated lactic acid, 2.1 and elevated procalcitonin 1.92.  He was started on vancomycin and cefepime and admitted for further infectious work-up. ? ?Interval History:  ?No events overnight.  He was nauseous and threw up in dialysis today.  Saw him in his room after returning from dialysis and he was very weak appearing and did not feel comfortable going home today.  We discussed probably going home tomorrow which he was okay with. ? ?Assessment and Plan: ?* UTI (urinary tract infection) ?- Chronic right nephrostomy tube with what appears to be poor maintenance and poor hygiene in general.  Dressing out of place and sutures also removed with concern for malposition of tube.  Patient unable to provide maintenance details of catheter care as well ?-CT also shows diffuse urinary bladder wall thickening consistent with either chronic bladder outlet obstruction versus cystitis ?-Suspect some component of colonization given chronicity however given significant weakness on admission, there is still  concern for possible underlying infectious etiology; also has a known history of MRSA UTIs ?-Continue vancomycin and cefepime and follow-up all cultures ?-IR consulted for nephrostomy tube exchange; able to be performed on 09/01/2021 ?- Antibiotics can now be escalated after tube exchange ? ?Loculated pleural effusion ?- Per CT chest, shows stable bilateral pleural effusions with partially loculated right-sided effusion ?- Continue antibiotics ?-Respiratory status and cough seem to be improved since admission.  He remains afebrile, no leukocytosis ?- Will complete antibiotic course ? ?ESRD (end stage renal disease) ([place]) ?- History of noncompliance.  Missed 08/29/2021 dialysis session due to his weakness ?-Nephrology aware of hospitalization ?- Continue dialysis plan per nephrology ? ?Generalized weakness ?- Appears to be wheelchair bound at baseline ?- monitor weakness response with infection treatment and workup ? ?Prolonged QT interval ?- admission EKG noted with QTc>520 ?- monitor K and Mg ?- avoiding prolonging agents as able  ? ?Chronic combined systolic and diastolic heart failure ([place]) ?- No signs of exacerbation at this time ?- Last echo October 2022: EF less than 67%, grade 2 diastolic dysfunction ? ?Protein-calorie malnutrition, severe ?- Patient?s BMI is Body mass index is 20.6 kg/m?Marland Kitchen. ?- Patient has the following signs/symptoms consistent with PCM: (fat loss, muscle loss, muscle wasting) ?- continue renal diet ? ?Anemia in ESRD (end-stage renal disease) ([place]) ?- Baseline hemoglobin approximately 9 to 10 g/dL.  Currently at baseline ? ?Essential hypertension ?- Continue Coreg and Avapro ? ? ?Old records reviewed in assessment of this patient ? ?Antimicrobials: ?Cefepime 08/30/2021 >> 4/13 ?Vancomycin 08/30/2021 >> 4/13 ?Augmentin 4/13 >> current  ? ?DVT prophylaxis:  ?SCD's Start: 08/31/21 0813 ?heparin injection 5,000 Units Start: 08/30/21 0600 ? ? ?  Code Status:   Code Status: Full Code ? ?Disposition Plan:  Home Saturday ?Status is: Inpatient ? ?Objective: ?Blood pressure 140/87, pulse 64, temperature 97.6 ?F (36.4 ?C), temperature source Oral, resp. rate 20, height 5\' 4"  (1.626 m), weight 43 kg, SpO2 97 %.  ?Examination:  ?Physical Exam ?Constitutional:   ?   General: He is not in acute distress. ?HENT:  ?   Head: Normocephalic and atraumatic.  ?   Mouth/Throat:  ?   Mouth: Mucous membranes are moist.  ?Eyes:  ?   Extraocular Movements: Extraocular movements intact.  ?Cardiovascular:  ?   Rate and Rhythm: Normal rate and regular rhythm.  ?   Heart sounds: Normal heart sounds.  ?Pulmonary:  ?   Effort: Pulmonary effort is normal. No respiratory distress.  ?   Comments: Improved coarse breath sounds bilaterally.  No wheezing ?Chest:  ?   Comments: Tenderness over right nephrostomy tube site ?Abdominal:  ?   General: Bowel sounds are normal. There is no distension.  ?   Palpations: Abdomen is soft.  ?   Tenderness: There is no abdominal tenderness.  ?Musculoskeletal:     ?   General: Normal range of motion.  ?   Cervical back: Normal range of motion and neck supple.  ?Skin: ?   General: Skin is warm and dry.  ?Neurological:  ?   General: No focal deficit present.  ?   Mental Status: He is alert.  ?Psychiatric:     ?   Mood and Affect: Mood normal.     ?   Behavior: Behavior normal.  ?  ? ?Consultants:  ?Nephrology ?IR ? ?Procedures:  ?R PCN exchange, 09/01/21 ? ?Data Reviewed: ?Results for orders placed or performed during the hospital encounter of 08/30/21 (from the past 24 hour(s))  ?Magnesium     Status: None  ? Collection Time: 09/02/21  5:51 AM  ?Result Value Ref Range  ? Magnesium 1.8 1.7 - 2.4 mg/dL  ?Renal function panel     Status: Abnormal  ? Collection Time: 09/02/21  5:51 AM  ?Result Value Ref Range  ? Sodium 139 135 - 145 mmol/L  ? Potassium 4.0 3.5 - 5.1 mmol/L  ? Chloride 102 98 - 111 mmol/L  ? CO2 25 22 - 32 mmol/L  ? Glucose, Bld 100 (H) 70 - 99 mg/dL  ? BUN 61 (H) 6 - 20 mg/dL  ? Creatinine, Ser 7.42 (H)  0.61 - 1.24 mg/dL  ? Calcium 7.0 (L) 8.9 - 10.3 mg/dL  ? Phosphorus 6.8 (H) 2.5 - 4.6 mg/dL  ? Albumin 2.1 (L) 3.5 - 5.0 g/dL  ? GFR, Estimated 8 (L) >60 mL/min  ? Anion gap 12 5 - 15  ?CBC with Differential/Platelet     Status: Abnormal  ? Collection Time: 09/02/21  5:51 AM  ?Result Value Ref Range  ? WBC 5.9 4.0 - 10.5 K/uL  ? RBC 3.04 (L) 4.22 - 5.81 MIL/uL  ? Hemoglobin 8.9 (L) 13.0 - 17.0 g/dL  ? HCT 29.2 (L) 39.0 - 52.0 %  ? MCV 96.1 80.0 - 100.0 fL  ? MCH 29.3 26.0 - 34.0 pg  ? MCHC 30.5 30.0 - 36.0 g/dL  ? RDW 16.6 (H) 11.5 - 15.5 %  ? Platelets 180 150 - 400 K/uL  ? nRBC 0.0 0.0 - 0.2 %  ? Neutrophils Relative % 80 %  ? Neutro Abs 4.7 1.7 - 7.7 K/uL  ? Lymphocytes Relative 8 %  ? Lymphs Abs 0.5 (  L) 0.7 - 4.0 K/uL  ? Monocytes Relative 7 %  ? Monocytes Absolute 0.4 0.1 - 1.0 K/uL  ? Eosinophils Relative 4 %  ? Eosinophils Absolute 0.2 0.0 - 0.5 K/uL  ? Basophils Relative 1 %  ? Basophils Absolute 0.0 0.0 - 0.1 K/uL  ? Immature Granulocytes 0 %  ? Abs Immature Granulocytes 0.02 0.00 - 0.07 K/uL  ?  ?I have Reviewed nursing notes, Vitals, and Lab results since pt's last encounter. Pertinent lab results : see above ?I have ordered test including BMP, CBC, Mg ?I have reviewed the last note from staff over past 24 hours ?I have discussed pt's care plan and test results with nursing staff, case manager ? ? LOS: 3 days  ? ?Dwyane Dee, MD ?[facility] ?09/02/2021, 6:11 PM ? ?

## 2021-09-03 MED ORDER — CHLORHEXIDINE GLUCONATE CLOTH 2 % EX PADS
6.0000 | MEDICATED_PAD | Freq: Every day | CUTANEOUS | Status: DC
  Administered 2021-09-03: 6 via TOPICAL

## 2021-09-03 MED ORDER — AMOXICILLIN-POT CLAVULANATE 500-125 MG PO TABS: 1.0000 | ORAL_TABLET | Freq: Two times a day (BID) | ORAL | 0 refills | Status: AC

## 2021-09-03 NOTE — TOC Transition Note (Signed)
Transition of Care (TOC) - CM/SW Discharge Note ? ? ?Patient Details  ?Name: Larry Morrison ?MRN: [id number] ?Date of Birth: 03-04-1971 ? ?Transition of Care (TOC) CM/SW Contact:  ?Alberteen Sam, LCSW ?Phone Number: ?09/03/2021, 10:21 AM ? ? ?Clinical Narrative:    ? ?Patient to discharge today home, lives in an apartment.  Patient's brother lives with him and helps him with bathing, cooking, and cleaning.  Patient's brother works at [employer] in [place].   ?Patient goes to dialysis MWF, he uses Medicaid transportation, [employer].   ? ?Patient walks some and uses his wheelchair some, he says he has been doing his own therapy so he does not want to have home health set up he would like to do it himself.   ?  ?Patient has a nephrostomy tube that he has been caring for and reports he has had no problems with it.   ? ?Patient to need EMS home, CSW has arranged and EMS forms on chart.  ? ?TOC signing off.  ?  ? ?Final next level of care: Home/Self Care ?Barriers to Discharge: No Barriers Identified ? ? ?Patient Goals and CMS Choice ?Patient states their goals for this hospitalization and ongoing recovery are:: to go home ?CMS Medicare.gov Compare Post Acute Care list provided to:: Patient ?Choice offered to / list presented to : Patient ? ?Discharge Placement ?  ?           ?  ?  ?  ?  ? ?Discharge Plan and Services ?  ?Discharge Planning Services: CM Consult ?           ?DME Arranged: N/A ?DME Agency: NA ?  ?  ?  ?HH Arranged: Patient Refused HH ?[place] Agency: NA ?  ?  ?  ? ?Social Determinants of Health (SDOH) Interventions ?  ? ? ?Readmission Risk Interventions ? ?  08/30/2021  ? 12:02 PM 04/12/2021  ? 12:59 PM 09/08/2020  ?  3:10 PM  ?Readmission Risk Prevention Plan  ?Transportation Screening Complete Complete Complete  ?Social Work Consult for [place] Planning/Counseling   Complete  ?Palliative Care Screening   Complete  ?Medication Review Press photographer) Complete Complete Complete  ?PCP or Specialist appointment  within 3-5 days of discharge Complete Complete   ?[place] or Home Care Consult Complete Complete   ?[place] or Home Care Consult Pt Refusal Comments  has in home care aids   ?SW Recovery Care/Counseling Consult Complete Not Complete   ?SW Consult Not Complete Comments  pt intubated   ?Palliative Care Screening Not Applicable Not Complete   ?Comments  intubated   ?Skilled Nursing Facility Not Applicable Not Complete   ?SNF Comments  intubated   ? ? ? ? ? ?

## 2021-09-03 NOTE — Progress Notes (Signed)
?[facility]  ?ROUNDING NOTE  ? ?Subjective:  ? ?Larry Morrison is a 51 year old African-American male with past medical history including pulmonary hypertension, combined CHF, CVA, hypertension, neurogenic bladder with right nephrostomy tube, and end-stage renal disease on hemodialysis.  Patient presents to the emergency department at the recommendation of his brother stating generalized weakness and a missed dialysis treatment.  Patient has been admitted for UTI (urinary tract infection) [N39.0] ?Healthcare-associated pneumonia [J18.9] ?Generalized weakness [R53.1] ? ?Patient is known to our practice and receives outpatient dialysis treatments at [facility] on a MWF schedule, supervised by Dr. Juleen China.   ? ?Uptodate: ?Lethargic today but able to answer questions appropriately ?No leg edema or shortness of breath ? ? ? ?Objective:  ?Vital signs in last 24 hours:  ?Temp:  [97.6 ?F (36.4 ?C)-99.4 ?F (37.4 ?C)] 97.9 ?F (36.6 ?C) (04/15 0802) ?Pulse Rate:  [61-72] 72 (04/15 0802) ?Resp:  [15-30] 16 (04/15 0802) ?BP: (118-140)/(74-87) 130/76 (04/15 0802) ?SpO2:  [93 %-98 %] 95 % (04/15 0802) ?Weight:  [43 kg] 43 kg (04/14 1315) ? ?Weight change:  ?Filed Weights  ? 08/31/21 1315 09/02/21 0915 09/02/21 1315  ?Weight: 47.8 kg 45 kg 43 kg  ? ? ?Intake/Output: ?I/O last 3 completed shifts: ?In: -  ?Out: 1831 [Urine:330; Emesis/NG output:1; Other:1500] ?  ?Intake/Output this shift: ? No intake/output data recorded. ? ?Physical Exam: ?General: NAD, resting quietly  ?Head: Normocephalic, atraumatic. Moist oral mucosal membranes  ?Eyes: Anicteric  ?Lungs:  Clear bilateral, normal effort on room air  ?Heart: Regular rate and rhythm  ?Abdomen:  Soft, nontender, nondistended  ?Extremities: No peripheral edema.  ?Neurologic: Nonfocal, moving all four extremities  ?Skin: No lesions  ?Access: Right IJ PermCath  ?Right nephrostomy tube. ? ?Basic Metabolic Panel: ?Recent Labs  ?Lab 08/30/21 ?0403 08/31/21 ?0324  09/01/21 ?7106 09/02/21 ?2694  ?NA 140 138 135 139  ?K 5.0 5.8* 3.7 4.0  ?CL 105 105 100 102  ?CO2 21* 18* 25 25  ?GLUCOSE 86 94 88 100*  ?BUN 74* 89* 44* 61*  ?CREATININE 9.61* 10.43* 6.07* 7.42*  ?CALCIUM 8.0* 7.7* 7.3* 7.0*  ?MG 2.0 2.1 1.7 1.8  ?PHOS  --  8.4* 5.6* 6.8*  ? ? ? ?Liver Function Tests: ?Recent Labs  ?Lab 08/30/21 ?0403 08/31/21 ?0324 09/01/21 ?8546 09/02/21 ?2703  ?AST 30  --   --   --   ?ALT 26  --   --   --   ?ALKPHOS 131*  --   --   --   ?BILITOT 0.9  --   --   --   ?PROT 8.4*  --   --   --   ?ALBUMIN 2.6* 2.4* 2.2* 2.1*  ? ? ?No results for input(s): LIPASE, AMYLASE in the last 168 hours. ?No results for input(s): AMMONIA in the last 168 hours. ? ?CBC: ?Recent Labs  ?Lab 08/29/21 ?2340 08/30/21 ?5009 08/31/21 ?3818 09/01/21 ?2993 09/02/21 ?7169  ?WBC 6.1 6.7 8.3 5.1 5.9  ?NEUTROABS 4.5  --   --  3.5 4.7  ?HGB 10.9* 10.7* 9.8* 9.0* 8.9*  ?HCT 37.1* 36.8* 31.6* 29.0* 29.2*  ?MCV 100.3* 102.8* 96.9 96.3 96.1  ?PLT 174 156 185 165 180  ? ? ? ?Cardiac Enzymes: ?No results for input(s): CKTOTAL, CKMB, CKMBINDEX, TROPONINI in the last 168 hours. ? ?BNP: ?Invalid input(s): POCBNP ? ?CBG: ?No results for input(s): GLUCAP in the last 168 hours. ? ?Microbiology: ?Results for orders placed or performed during the hospital encounter of 08/30/21  ?  Resp Panel by RT-PCR (Flu A&B, Covid) Nasopharyngeal Swab     Status: None  ? Collection Time: 08/29/21 11:40 PM  ? Specimen: Nasopharyngeal Swab; Nasopharyngeal(NP) swabs in vial transport medium  ?Result Value Ref Range Status  ? SARS Coronavirus 2 by RT PCR NEGATIVE NEGATIVE Final  ?  Comment: (NOTE) ?SARS-CoV-2 target nucleic acids are NOT DETECTED. ? ?The SARS-CoV-2 RNA is generally detectable in upper respiratory ?specimens during the acute phase of infection. The lowest ?concentration of SARS-CoV-2 viral copies this assay can detect is ?138 copies/mL. A negative result does not preclude SARS-Cov-2 ?infection and should not be used as the sole basis for  treatment or ?other patient management decisions. A negative result may occur with  ?improper specimen collection/handling, submission of specimen other ?than nasopharyngeal swab, presence of viral mutation(s) within the ?areas targeted by this assay, and inadequate number of viral ?copies(<138 copies/mL). A negative result must be combined with ?clinical observations, patient history, and epidemiological ?information. The expected result is Negative. ? ?Fact Sheet for Patients:  ?EntrepreneurPulse.com.au ? ?Fact Sheet for Healthcare Providers:  ?IncredibleEmployment.be ? ?This test is no t yet approved or cleared by the [place] FDA and  ?has been authorized for detection and/or diagnosis of SARS-CoV-2 by ?FDA under an Emergency Use Authorization (EUA). This EUA will remain  ?in effect (meaning this test can be used) for the duration of the ?COVID-19 declaration under Section 564(b)(1) of the Act, 21 ?U.S.C.section 360bbb-3(b)(1), unless the authorization is terminated  ?or revoked sooner.  ? ? ?  ? Influenza A by PCR NEGATIVE NEGATIVE Final  ? Influenza B by PCR NEGATIVE NEGATIVE Final  ?  Comment: (NOTE) ?The Xpert Xpress SARS-CoV-2/FLU/RSV plus assay is intended as an aid ?in the diagnosis of influenza from Nasopharyngeal swab specimens and ?should not be used as a sole basis for treatment. Nasal washings and ?aspirates are unacceptable for Xpert Xpress SARS-CoV-2/FLU/RSV ?testing. ? ?Fact Sheet for Patients: ?EntrepreneurPulse.com.au ? ?Fact Sheet for Healthcare Providers: ?IncredibleEmployment.be ? ?This test is not yet approved or cleared by the [place] FDA and ?has been authorized for detection and/or diagnosis of SARS-CoV-2 by ?FDA under an Emergency Use Authorization (EUA). This EUA will remain ?in effect (meaning this test can be used) for the duration of the ?COVID-19 declaration under Section 564(b)(1) of the Act, 21  U.S.C. ?section 360bbb-3(b)(1), unless the authorization is terminated or ?revoked. ? ?Performed at [facility], [place], ?[place] [address] ?  ?Urine Culture     Status: Abnormal  ? Collection Time: 08/30/21  2:25 AM  ? Specimen: Urine, Clean Catch  ?Result Value Ref Range Status  ? Specimen Description   Final  ?  URINE, CLEAN CATCH ?Performed at [facility], [address] [place], [place] [address] ?  ? Special Requests   Final  ?  NONE ?Performed at [facility], [address], [place], [place] [address] ?  ? Culture (A)  Final  ?  <10,000 COLONIES/mL INSIGNIFICANT GROWTH ?Performed at [facility], [place] [address], [place], [place] [address] ?  ? Report Status 08/31/2021 FINAL  Final  ?Culture, blood (routine x 2)     Status: None (Preliminary result)  ? Collection Time: 08/30/21  2:56 AM  ? Specimen: BLOOD  ?Result Value Ref Range Status  ? Specimen Description BLOOD RIGHT FOREARM  Final  ? Special Requests   Final  ?  BOTTLES DRAWN AEROBIC AND ANAEROBIC Blood Culture adequate volume  ?  Culture   Final  ?  NO GROWTH 4 DAYS ?Performed at [facility], [address], [place], [place] [address] ?  ? Report Status PENDING  Incomplete  ?Culture, blood (routine x 2)     Status: None (Preliminary result)  ? Collection Time: 08/30/21 11:47 PM  ? Specimen: BLOOD  ?Result Value Ref Range Status  ? Specimen Description BLOOD RIGHT FOREARM  Final  ? Special Requests   Final  ?  BOTTLES DRAWN AEROBIC AND ANAEROBIC Blood Culture adequate volume  ? Culture   Final  ?  NO GROWTH 4 DAYS ?Performed at [facility], [address], [place], [place] [address] ?  ? Report Status PENDING  Incomplete  ? ? ?Coagulation Studies: ?No results for input(s): LABPROT, INR in the last 72 hours. ? ?Urinalysis: ?No results for input(s): COLORURINE, LABSPEC, [place], GLUCOSEU, HGBUR, BILIRUBINUR, KETONESUR, PROTEINUR, UROBILINOGEN, NITRITE, LEUKOCYTESUR in the last 72  hours. ? ?Invalid input(s): APPERANCEUR ?  ? ? ?Imaging: ?No results found. ? ? ?Medications:  ? ? ? ? amoxicillin-clavulanate  1 tablet Oral BID  ? carvedilol  12.5 mg Oral BID  ? Chlorhexidine Gluconate Cloth  6 eac

## 2021-09-03 NOTE — Evaluation (Signed)
Physical Therapy Evaluation ?Patient Details ?Name: Larry Morrison ?MRN: [id number] ?DOB: 1970/06/07 ?Today's Date: 09/03/2021 ? ?History of Present Illness ? 51 year old African-American male with past medical history including pulmonary hypertension, combined CHF, CVA, hypertension, neurogenic bladder with right nephrostomy tube, and end-stage renal disease on hemodialysis.  Patient presents to the emergency department at the recommendation of his brother stating generalized weakness and a missed dialysis treatment.  Patient has been admitted for UTI, PNA, and weakness. ?  ?Clinical Impression ? Physical Therapy Re-eval completed this date. Patient tolerated session well, with no pain reported throughout session. Patient's chart reviewed for baseline PLOF and mobility. Per chart patient lives in an entry level apartment with his brother, who assists him in ADLs such as bathing, and functional transfers in and out of bed. Patient demonstrated ability to perform rolling L and R with cueing on hand placement at supervision. Patient required Max A +2 to reposition patient up towards [facility], and required Max A to change hospital gown. Patient refused additional EOB and OOB activity. Patient appears to be at his baseline level of function, and unwilling to participate in further PT services. PT signing off.  ?   ? ?Recommendations for follow up therapy are one component of a multi-disciplinary discharge planning process, led by the attending physician.  Recommendations may be updated based on patient status, additional functional criteria and insurance authorization. ? ?Follow Up Recommendations No PT follow up ? ?  ?Assistance Recommended at Discharge Frequent or constant Supervision/Assistance  ?Patient can return home with the following ?   ? ?  ?Equipment Recommendations None recommended by PT  ?Recommendations for Other Services ?    ?  ?Functional Status Assessment Patient has not had a recent decline in their functional  status  ? ?  ?Precautions / Restrictions Precautions ?Precautions: Fall ?Restrictions ?Weight Bearing Restrictions: No  ? ?  ? ?Mobility ? Bed Mobility ?Overal bed mobility: Needs Assistance ?Bed Mobility: Rolling ?Rolling: Supervision ?  ?  ?  ?  ?General bed mobility comments: no physical assist to roll, however did required cueing on hand placement, Max A +2 to reposition in bed ?  ? ?Transfers ?  ?  ?  ?  ?  ?  ?  ?  ?  ?General transfer comment: Pt refuses transfer ?  ? ?Ambulation/Gait ?  ?  ?  ?  ?  ?  ?  ?  ? ?Stairs ?  ?  ?  ?  ?  ? ?Wheelchair Mobility ?  ? ?Modified Rankin (Stroke Patients Only) ?  ? ?  ? ?Balance   ?  ?  ?  ?  ?  ?  ?  ?  ?  ?  ?  ?  ?  ?  ?  ?  ?  ?  ?   ? ? ? ?Pertinent Vitals/Pain Pain Assessment ?Pain Assessment: No/denies pain  ? ? ?Home Living Family/patient expects to be discharged to:: Private residence ?Living Arrangements: Other relatives (brother) ?Available Help at Discharge: Family;Available 24 hours/day ?Type of Home: Apartment ?Home Access: Level entry ?  ?  ?  ?Home Layout: One level ?Home Equipment: Conservation officer, nature (2 wheels);Wheelchair - manual;Shower seat;Grab bars - tub/shower ?   ?  ?Prior Function Prior Level of Function : Needs assist ?  ?  ?  ?  ?  ?  ?Mobility Comments: Pt reports brother transfers him to wheelchair where he stays for several house. He reports being able to propel himself  but needing ocassional assist. ?ADLs Comments: Pt reports brother assists him with dressing and bathing from bed. Brother performs all IADLs ?  ? ? ?Hand Dominance  ? Dominant Hand: Right ? ?  ?Extremity/Trunk Assessment  ? Upper Extremity Assessment ?Upper Extremity Assessment: Generalized weakness;Overall [facility] for tasks assessed ?  ? ?Lower Extremity Assessment ?Lower Extremity Assessment: Generalized weakness;Overall [facility] for tasks assessed ?  ? ?   ?Communication  ? Communication: No difficulties  ?Cognition Arousal/Alertness: Awake/alert ?Behavior During Therapy: [facility] for tasks  assessed/performed ?Overall Cognitive Status: Within Functional Limits for tasks assessed ?  ?  ?  ?  ?  ?  ?  ?  ?  ?  ?  ?  ?  ?  ?  ?  ?  ?  ?  ? ?  ?General Comments   ? ?  ?Exercises    ? ?Assessment/Plan  ?  ?PT Assessment Patient does not need any further PT services  ?PT Problem List   ? ?   ?  ?PT Treatment Interventions     ? ?PT Goals (Current goals can be found in the Care Plan section)  ?Acute Rehab PT Goals ?PT Goal Formulation: Patient unable to participate in goal setting ?Time For Goal Achievement: 09/17/21 ?Potential to Achieve Goals: Good ? ?  ?Frequency   ?  ? ? ?Co-evaluation   ?  ?  ?  ?  ? ? ?  ?AM-PAC PT "6 Clicks" Mobility  ?Outcome Measure Help needed turning from your back to your side while in a flat bed without using bedrails?: A Little ?Help needed moving from lying on your back to sitting on the side of a flat bed without using bedrails?: A Lot ?Help needed moving to and from a bed to a chair (including a wheelchair)?: A Lot ?Help needed standing up from a chair using your arms (e.g., wheelchair or bedside chair)?: A Lot ?Help needed to walk in hospital room?: A Lot ?Help needed climbing 3-5 steps with a railing? : A Lot ?6 Click Score: 13 ? ?  ?End of Session Equipment Utilized During Treatment: Gait belt ?Activity Tolerance: Patient tolerated treatment well ?Patient left: in bed;with call bell/phone within reach;with bed alarm set ?Nurse Communication: Mobility status ?PT Visit Diagnosis: Muscle weakness (generalized) (M62.81) ?  ? ?Time: 7793-9030 ?PT Time Calculation (min) (ACUTE ONLY): 10 min ? ? ?Charges:   PT Evaluation ?$PT Eval Low Complexity: 1 Low ?  ?  ?   ? ? ?Iva Boop, PT  ?09/03/21. 9:04 AM ? ? ?

## 2021-09-03 NOTE — Discharge Summary (Signed)
?Physician Discharge Summary ?  ?Patient: Larry Morrison MRN: [id number] DOB: 12-23-1970  ?Admit date:     08/30/2021  ?Discharge date: 09/03/21  ?Discharge Physician: Dwyane Dee  ? ?PCP: [facility]  ? ?Recommendations at discharge:  ? ?If returns for respiratory sxms, may need further workup regarding small loculated pleural effusion ?Continue outpatient HD ?Poor hygeine at baseline, confirm proper caring of R nephrostomy tube  ? ?Discharge Diagnoses: ?Principal Problem: ?  UTI (urinary tract infection) ?Active Problems: ?  ESRD (end stage renal disease) ([place]) ?  Loculated pleural effusion ?  Generalized weakness ?  Essential hypertension ?  Anemia in ESRD (end-stage renal disease) ([place]) ?  Protein-calorie malnutrition, severe ?  Chronic combined systolic and diastolic heart failure ([place]) ?  Prolonged QT interval ? ?Resolved Problems: ?  * No resolved hospital problems. * ? ?Hospital Course: ?Mr. Santillana is a 51 yo male with PMH ESRD on HD MWF, HTN, neurogenic bladder with chronic right nephrostomy tube, pulmonary HTN, chronic systolic/diastolic CHF, CVA, recurrent UTIs wheelchair dependent who presented with generalized weakness and missing HD session on 4/10 due to the weakness. He had some reported cough too on admission with sputum production. ?There was concern for infection on presentation and he underwent CT chest/abdomen/pelvis.  This showed stable areas of marked severity left lower lobe and inferolateral left upper lobe consolidation and patchy airspace disease.  Stable bilateral pleural effusions partially loculated on the right. Also noted atrophic right kidney s/p nephrostomy tube, diffuse urinary bladder wall thickening. ?He had mildly elevated lactic acid, 2.1 and elevated procalcitonin 1.92.  He was started on vancomycin and cefepime and admitted for further infectious work-up. ? ?Assessment and Plan: ?* UTI (urinary tract infection) ?- Chronic right nephrostomy tube with what appears to  be poor maintenance and poor hygiene in general.  Dressing out of place and sutures also removed with concern for malposition of tube.  Patient unable to provide maintenance details of catheter care as well ?-CT also shows diffuse urinary bladder wall thickening consistent with either chronic bladder outlet obstruction versus cystitis ?-Suspect some component of colonization given chronicity however given significant weakness on admission, there is still concern for possible underlying infectious etiology; also has a known history of MRSA UTIs ?-Treated with vancomycin and cefepime which were de-escalated to Augmentin to complete course at discharge ?-IR consulted for nephrostomy tube exchange; able to be performed on 09/01/2021 ? ?Loculated pleural effusion ?- Per CT chest, shows stable bilateral pleural effusions with partially loculated right-sided effusion ?- Continue antibiotics ?-Respiratory status and cough seem to be improved since admission.  He remains afebrile, no leukocytosis ?- Will complete antibiotic course ?-If recurrent hospitalization for further respiratory symptoms, may need further evaluation and invasive treatment such as VATS/chest tube/or thoracentesis for sampling  ? ?ESRD (end stage renal disease) ([place]) ?- History of noncompliance.  Missed 08/29/2021 dialysis session due to his weakness ?-Nephrology aware of hospitalization ?- Continue dialysis plan per nephrology ? ?Generalized weakness ?- Appears to be wheelchair bound at baseline ? ?Prolonged QT interval ?- admission EKG noted with QTc>520 ?- monitor K and Mg ?- avoiding prolonging agents as able  ? ?Chronic combined systolic and diastolic heart failure ([place]) ?- No signs of exacerbation at this time ?- Last echo October 2022: EF less than 75%, grade 2 diastolic dysfunction ? ?Protein-calorie malnutrition, severe ?- Patient?s BMI is Body mass index is 20.6 kg/m?Marland Kitchen. ?- Patient has the following signs/symptoms consistent with PCM: (fat loss,  muscle loss, muscle  wasting) ?- continue renal diet ? ?Anemia in ESRD (end-stage renal disease) ([place]) ?- Baseline hemoglobin approximately 9 to 10 g/dL.  Currently at baseline ? ?Essential hypertension ?- Continue Coreg and Avapro ? ? ? ? ?  ? ? ?Consultants:  ?Nephrology ?IR ? ?Procedures performed:   ?R PCN exchange, 09/01/21 ? ?Disposition: Home ?Diet recommendation:  ?Renal diet ?DISCHARGE MEDICATION: ?Allergies as of 09/03/2021   ? ?   Reactions  ? Vancomycin   ? Patient denies - repeated denial to pharm tech 09-05-2020  ? ?  ? ?  ?Medication List  ?  ? ?TAKE these medications   ? ?amoxicillin-clavulanate 500-125 MG tablet ?Commonly known as: AUGMENTIN ?Take 1 tablet (500 mg total) by mouth 2 (two) times daily for 3 days. ?  ?aspirin 81 MG EC tablet ?Take 1 tablet (81 mg total) by mouth daily. Swallow whole. ?  ?carvedilol 12.5 MG tablet ?Commonly known as: COREG ?Take 1 tablet (12.5 mg total) by mouth 2 (two) times daily. ?  ?feeding supplement (NEPRO CARB STEADY) Liqd ?Take 237 mLs by mouth 2 (two) times daily between meals. ?  ?irbesartan 150 MG tablet ?Commonly known as: AVAPRO ?Take 1 tablet (150 mg total) by mouth daily. ?  ? ?  ? ? ?Discharge Exam: ?Filed Weights  ? 08/31/21 1315 09/02/21 0915 09/02/21 1315  ?Weight: 47.8 kg 45 kg 43 kg  ? ?Physical Exam ?Constitutional:   ?   General: He is not in acute distress. ?HENT:  ?   Head: Normocephalic and atraumatic.  ?   Mouth/Throat:  ?   Mouth: Mucous membranes are moist.  ?Eyes:  ?   Extraocular Movements: Extraocular movements intact.  ?Cardiovascular:  ?   Rate and Rhythm: Normal rate and regular rhythm.  ?   Heart sounds: Normal heart sounds.  ?Pulmonary:  ?   Effort: Pulmonary effort is normal. No respiratory distress.  ?   Comments: Improved coarse breath sounds bilaterally.  No wheezing ?Chest:  ?   Comments: Tenderness over right nephrostomy tube site which is chronic ?Abdominal:  ?   General: Bowel sounds are normal. There is no distension.  ?    Palpations: Abdomen is soft.  ?   Tenderness: There is no abdominal tenderness.  ?Musculoskeletal:     ?   General: Normal range of motion.  ?   Cervical back: Normal range of motion and neck supple.  ?Skin: ?   General: Skin is warm and dry.  ?Neurological:  ?   General: No focal deficit present.  ?   Mental Status: He is alert.  ?Psychiatric:     ?   Mood and Affect: Mood normal.     ?   Behavior: Behavior normal.  ? ? ? ?Condition at discharge: stable ? ?The results of significant diagnostics from this hospitalization (including imaging, microbiology, ancillary and laboratory) are listed below for reference.  ? ?Imaging Studies: ?DG Chest 2 View ? ?Result Date: 08/30/2021 ?CLINICAL DATA:  Cough and weakness EXAM: CHEST - 2 VIEW COMPARISON:  06/30/2021 FINDINGS: Cardiac shadow is enlarged but stable. Dialysis catheter is again seen. Bilateral pleural effusions are noted left slightly greater than right stable in appearance from the prior exam. Some patchy atelectatic changes are seen also stable from the prior study. No new focal abnormality is noted. IMPRESSION: Bilateral pleural effusions and basilar atelectasis stable from the prior exam. Electronically Signed   By: Inez Catalina M.D.   On: 08/30/2021 00:30  ? ?DG Shoulder Right ? ?  Result Date: 08/22/2021 ?CLINICAL DATA:  Trauma, fall EXAM: RIGHT SHOULDER - 2+ VIEW COMPARISON:  None FINDINGS: No recent fracture or dislocation is seen. In 1 of the AP views, there is a 2 mm smooth marginated sclerotic density in the inferior margin of acromion, possibly a bony spur due to degenerative changes or previous injury. There is vascular stent in the course of right axillary vessels. Right IJ dialysis catheter is noted. IMPRESSION: No recent fracture or dislocation is seen in right shoulder. Electronically Signed   By: Elmer Picker M.D.   On: 08/22/2021 14:02  ? ?CT CHEST ABDOMEN PELVIS W CONTRAST ? ?Result Date: 08/30/2021 ?CLINICAL DATA:  Mid abdominal pain. EXAM: CT  CHEST, ABDOMEN, AND PELVIS WITH CONTRAST TECHNIQUE: Multidetector CT imaging of the chest, abdomen and pelvis was performed following the standard protocol during bolus administration of intravenous contras

## 2021-09-04 LAB — CULTURE, BLOOD (ROUTINE X 2)
Culture: NO GROWTH
Culture: NO GROWTH
Special Requests: ADEQUATE
Special Requests: ADEQUATE

## 2021-09-13 ENCOUNTER — Telehealth (INDEPENDENT_AMBULATORY_CARE_PROVIDER_SITE_OTHER): Payer: Self-pay

## 2021-09-13 NOTE — Telephone Encounter (Signed)
I received a call from Will at [facility] wanting the patient to rescheduled for his left AV graft placement. Patient has been rescheduled with Dr. Delana Meyer on 09/23/21 at the [facility]. Pre-surgical instructions will be faxed to Will at [facility]. ?

## 2021-09-16 ENCOUNTER — Encounter
Admission: RE | Admit: 2021-09-16 | Discharge: 2021-09-16 | Disposition: A | Discharge status: Home / Self Care | Payer: Medicaid Other | Source: Ambulatory Visit | Attending: Vascular Surgery | Admitting: Vascular Surgery

## 2021-09-16 ENCOUNTER — Other Ambulatory Visit (INDEPENDENT_AMBULATORY_CARE_PROVIDER_SITE_OTHER): Payer: Self-pay | Admitting: Nurse Practitioner

## 2021-09-16 VITALS — Ht 64.0 in | Wt 120.0 lb

## 2021-09-16 DIAGNOSIS — N186 End stage renal disease: Secondary | ICD-10-CM

## 2021-09-16 DIAGNOSIS — Z01812 Encounter for preprocedural laboratory examination: Secondary | ICD-10-CM

## 2021-09-16 MED ORDER — SODIUM CHLORIDE 0.9% FLUSH
5.0000 mL | Freq: Three times a day (TID) | INTRAVENOUS | Status: DC
  Filled 2021-09-16: qty 6

## 2021-09-16 NOTE — Patient Instructions (Addendum)
Your procedure is scheduled on: Friday, May 5 ?Report to the Registration Desk on the 1st floor of the [facility]. ?To find out your arrival time, please call [phone] between 1PM - 3PM on: Thursday, May 4 ?If your arrival time is 6:00 am, do not arrive prior to that time as the [facility] entrance doors do not open until 6:00 am. ? ?REMEMBER: ?Instructions that are not followed completely may result in serious medical risk, up to and including death; or upon the discretion of your surgeon and anesthesiologist your surgery may need to be rescheduled. ? ?Do not eat or drink after midnight the night before surgery.  ?No gum chewing, lozengers or hard candies. ? ?TAKE THESE MEDICATIONS THE MORNING OF SURGERY WITH A SIP OF WATER: ? ?Carvedilol ? ?One week prior to surgery: starting April 28 ?Stop Anti-inflammatories (NSAIDS) such as Advil, Aleve, Ibuprofen, Motrin, Naproxen, Naprosyn and Aspirin based products such as Excedrin, Goodys Powder, BC Powder. ?Stop ANY OVER THE COUNTER supplements until after surgery. ?You may however, continue to take Tylenol if needed for pain up until the day of surgery. ? ?No Alcohol for 24 hours before or after surgery. ? ?No Smoking including e-cigarettes for 24 hours prior to surgery.  ?No chewable tobacco products for at least 6 hours prior to surgery.  ?No nicotine patches on the day of surgery. ? ?Do not use any "recreational" drugs for at least a week prior to your surgery.  ?Please be advised that the combination of cocaine and anesthesia may have negative outcomes, up to and including death. ?If you test positive for cocaine, your surgery will be cancelled. ? ?On the morning of surgery brush your teeth with toothpaste and water, you may rinse your mouth with mouthwash if you wish. ?Do not swallow any toothpaste or mouthwash. ? ?Shower using antibacterial soap prior to coming to the hospital on the day of surgery. ? ?Do not wear jewelry, make-up, hairpins, clips or nail  polish. ? ?Do not wear lotions, powders, or perfumes.  ? ?Do not shave body from the neck down 48 hours prior to surgery just in case you cut yourself which could leave a site for infection.  ? ?Contact lenses, hearing aids and dentures may not be worn into surgery. ? ?Do not bring valuables to the hospital. [facility] is not responsible for any missing/lost belongings or valuables.  ? ?Notify your doctor if there is any change in your medical condition (cold, fever, infection). ? ?Wear comfortable clothing (specific to your surgery type) to the hospital. ? ?After surgery, you can help prevent lung complications by doing breathing exercises.  ?Take deep breaths and cough every 1-2 hours. Your doctor may order a device called an Incentive Spirometer to help you take deep breaths. ? ?If you are being discharged the day of surgery, you will not be allowed to drive home. ?You will need a responsible adult (18 years or older) to drive you home and stay with you that night.  ? ?If you are taking public transportation, you will need to have a responsible adult (18 years or older) with you. ?Please confirm with your physician that it is acceptable to use public transportation.  ? ?Please call the [facility] at [phone] if you have any questions about these instructions. ? ?Surgery Visitation Policy: ? ?Patients undergoing a surgery or procedure may have two family members or support persons with them as long as the person is not COVID-19 positive or experiencing  its symptoms.  ?

## 2021-09-16 NOTE — Pre-Procedure Instructions (Signed)
Spoke with Will at dialysis center who stated that [facility] transportation has already been set up for transport service to/from hospital on day of surgery and to receive dialysis on the Saturday after surgery. Pre-op interview completed with the patient, verbal instructions given. Written instructions faxed to the dialysis center for patient pick up today.  ?

## 2021-09-19 ENCOUNTER — Encounter: Payer: Self-pay | Admitting: Vascular Surgery

## 2021-09-21 ENCOUNTER — Emergency Department
Admission: EM | Admit: 2021-09-21 | Discharge: 2021-09-22 | Disposition: A | Discharge status: Home / Self Care | Payer: Medicaid Other | Attending: Emergency Medicine | Admitting: Emergency Medicine

## 2021-09-21 ENCOUNTER — Other Ambulatory Visit: Payer: Self-pay

## 2021-09-21 ENCOUNTER — Emergency Department: Payer: Medicaid Other

## 2021-09-21 DIAGNOSIS — N186 End stage renal disease: Secondary | ICD-10-CM | POA: Diagnosis not present

## 2021-09-21 DIAGNOSIS — R0602 Shortness of breath: Secondary | ICD-10-CM | POA: Diagnosis present

## 2021-09-21 DIAGNOSIS — Z992 Dependence on renal dialysis: Secondary | ICD-10-CM | POA: Diagnosis not present

## 2021-09-21 DIAGNOSIS — E8779 Other fluid overload: Secondary | ICD-10-CM | POA: Insufficient documentation

## 2021-09-21 DIAGNOSIS — Z91158 Patient's noncompliance with renal dialysis for other reason: Secondary | ICD-10-CM

## 2021-09-21 LAB — CBC WITH DIFFERENTIAL/PLATELET
Abs Immature Granulocytes: 0.03 10*3/uL (ref 0.00–0.07)
Basophils Absolute: 0.1 10*3/uL (ref 0.0–0.1)
Basophils Relative: 2 %
Eosinophils Absolute: 0.2 10*3/uL (ref 0.0–0.5)
Eosinophils Relative: 3 %
HCT: 36.2 % — ABNORMAL LOW (ref 39.0–52.0)
Hemoglobin: 11.3 g/dL — ABNORMAL LOW (ref 13.0–17.0)
Immature Granulocytes: 1 %
Lymphocytes Relative: 22 %
Lymphs Abs: 1.2 10*3/uL (ref 0.7–4.0)
MCH: 29.7 pg (ref 26.0–34.0)
MCHC: 31.2 g/dL (ref 30.0–36.0)
MCV: 95 fL (ref 80.0–100.0)
Monocytes Absolute: 0.5 10*3/uL (ref 0.1–1.0)
Monocytes Relative: 9 %
Neutro Abs: 3.5 10*3/uL (ref 1.7–7.7)
Neutrophils Relative %: 63 %
Platelets: 239 10*3/uL (ref 150–400)
RBC: 3.81 MIL/uL — ABNORMAL LOW (ref 4.22–5.81)
RDW: 16.5 % — ABNORMAL HIGH (ref 11.5–15.5)
WBC: 5.5 10*3/uL (ref 4.0–10.5)
nRBC: 0 % (ref 0.0–0.2)

## 2021-09-21 LAB — COMPREHENSIVE METABOLIC PANEL
ALT: 34 U/L (ref 0–44)
AST: 28 U/L (ref 15–41)
Albumin: 3.1 g/dL — ABNORMAL LOW (ref 3.5–5.0)
Alkaline Phosphatase: 182 U/L — ABNORMAL HIGH (ref 38–126)
Anion gap: 19 — ABNORMAL HIGH (ref 5–15)
BUN: 82 mg/dL — ABNORMAL HIGH (ref 6–20)
CO2: 18 mmol/L — ABNORMAL LOW (ref 22–32)
Calcium: 8.1 mg/dL — ABNORMAL LOW (ref 8.9–10.3)
Chloride: 102 mmol/L (ref 98–111)
Creatinine, Ser: 10.25 mg/dL — ABNORMAL HIGH (ref 0.61–1.24)
GFR, Estimated: 6 mL/min — ABNORMAL LOW (ref >60)
Glucose, Bld: 81 mg/dL (ref 70–99)
Potassium: 4.8 mmol/L (ref 3.5–5.1)
Sodium: 139 mmol/L (ref 135–145)
Total Bilirubin: 1 mg/dL (ref 0.3–1.2)
Total Protein: 9.1 g/dL — ABNORMAL HIGH (ref 6.5–8.1)

## 2021-09-21 LAB — PHOSPHORUS: Phosphorus: 11.1 mg/dL — ABNORMAL HIGH (ref 2.5–4.6)

## 2021-09-21 LAB — TROPONIN I (HIGH SENSITIVITY)
Troponin I (High Sensitivity): 157 ng/L (ref <18)
Troponin I (High Sensitivity): 157 ng/L (ref <18)

## 2021-09-21 LAB — BRAIN NATRIURETIC PEPTIDE: B Natriuretic Peptide: >4500 pg/mL — ABNORMAL HIGH (ref 0.0–100.0)

## 2021-09-21 MED ORDER — PENTAFLUOROPROP-TETRAFLUOROETH EX AERO: 1.0000 "application " | INHALATION_SPRAY | CUTANEOUS | Status: DC | PRN

## 2021-09-21 MED ORDER — LIDOCAINE HCL (PF) 1 % IJ SOLN
5.0000 mL | INTRAMUSCULAR | Status: DC | PRN
  Filled 2021-09-21: qty 5

## 2021-09-21 MED ORDER — SODIUM CHLORIDE 0.9 % IV SOLN: 100.0000 mL | INTRAVENOUS | Status: DC | PRN

## 2021-09-21 MED ORDER — LIDOCAINE-PRILOCAINE 2.5-2.5 % EX CREA: 1.0000 "application " | TOPICAL_CREAM | CUTANEOUS | Status: DC | PRN

## 2021-09-21 MED ORDER — HEPARIN SODIUM (PORCINE) 1000 UNIT/ML DIALYSIS: 1000.0000 [IU] | INTRAMUSCULAR | Status: DC | PRN

## 2021-09-21 MED ORDER — HEPARIN SODIUM (PORCINE) 1000 UNIT/ML IJ SOLN
INTRAMUSCULAR | Status: AC
  Administered 2021-09-21: 4200 [IU] via INTRAVENOUS_CENTRAL
  Filled 2021-09-21: qty 10

## 2021-09-21 MED ORDER — ALTEPLASE 2 MG IJ SOLR: 2.0000 mg | Freq: Once | INTRAMUSCULAR | Status: DC | PRN

## 2021-09-21 MED ORDER — CHLORHEXIDINE GLUCONATE CLOTH 2 % EX PADS
6.0000 | MEDICATED_PAD | Freq: Every day | CUTANEOUS | Status: DC
  Filled 2021-09-21: qty 6

## 2021-09-21 NOTE — ED Notes (Signed)
?  Patient in hallway waiting for EMS to transport him back home.  Patient given crackers and water.  Resting comfortably at this time. ?

## 2021-09-21 NOTE — ED Provider Notes (Signed)
? ?[facility] ?Provider Note ? ? Event Date/Time  ? First MD Initiated Contact with Patient 09/21/21 1138   ?  (approximate) ?History  ?Weakness ? ?HPI ?Larry Morrison is a 51 y.o. male with stated past medical history of end-stage renal disease on dialysis who presents for worsening shortness of breath and dyspnea on exertion after missing 2 of his most recent dialysis treatments.  Patient states that he "did not have the energy" to go to the first when he missed and then was "not feeling good" and missed the next 1.  Patient denies any other exacerbating or relieving factors.  Patient gets his dialysis through a permacath in the right upper chest wall.  Patient currently denies any vision changes, tinnitus, difficulty speaking, facial droop, sore throat, chest pain, abdominal pain, nausea/vomiting/diarrhea, dysuria, or weakness/numbness/paresthesias in any extremity ?Physical Exam  ?Triage Vital Signs: ?ED Triage Vitals  ?Enc Vitals Group  ?   BP 09/21/21 1129 (!) 161/106  ?   Pulse Rate 09/21/21 1129 90  ?   Resp 09/21/21 1129 16  ?   Temp 09/21/21 1129 97.8 ?F (36.6 ?C)  ?   Temp Source 09/21/21 1129 Oral  ?   SpO2 09/21/21 1129 97 %  ?   Weight 09/21/21 1131 120 lb (54.4 kg)  ?   Height 09/21/21 1131 5\' 4"  (1.626 m)  ?   Head Circumference --   ?   Peak Flow --   ?   Pain Score 09/21/21 1131 0  ?   Pain Loc --   ?   Pain Edu? --   ?   Excl. in [place]? --   ? ?Most recent vital signs: ?Vitals:  ? 09/21/21 1129 09/21/21 1236  ?BP: (!) 161/106 (!) 154/96  ?Pulse: 90 83  ?Resp: 16 16  ?Temp: 97.8 ?F (36.6 ?C)   ?SpO2: 97% 92%  ? ?General: Awake, oriented x4. ?CV:  Good peripheral perfusion.  ?Resp:  Normal effort.  ?Abd:  No distention.  ?Other:  Middle-aged cachectic African-American male sitting in bed in no distress with a permacath to the right upper chest wall ?ED Results / Procedures / Treatments  ?Labs ?(all labs ordered are listed, but only abnormal results are displayed) ?Labs Reviewed   ?COMPREHENSIVE METABOLIC PANEL - Abnormal; Notable for the following components:  ?    Result Value  ? CO2 18 (*)   ? BUN 82 (*)   ? Creatinine, Ser 10.25 (*)   ? Calcium 8.1 (*)   ? Total Protein 9.1 (*)   ? Albumin 3.1 (*)   ? Alkaline Phosphatase 182 (*)   ? GFR, Estimated 6 (*)   ? Anion gap 19 (*)   ? All other components within normal limits  ?BRAIN NATRIURETIC PEPTIDE - Abnormal; Notable for the following components:  ? B Natriuretic Peptide >4,500.0 (*)   ? All other components within normal limits  ?CBC WITH DIFFERENTIAL/PLATELET - Abnormal; Notable for the following components:  ? RBC 3.81 (*)   ? Hemoglobin 11.3 (*)   ? HCT 36.2 (*)   ? RDW 16.5 (*)   ? All other components within normal limits  ?PHOSPHORUS - Abnormal; Notable for the following components:  ? Phosphorus 11.1 (*)   ? All other components within normal limits  ?TROPONIN I (HIGH SENSITIVITY) - Abnormal; Notable for the following components:  ? Troponin I (High Sensitivity) 157 (*)   ? All other components within normal limits  ?HEPATITIS B SURFACE  ANTIGEN  ?HEPATITIS B SURFACE ANTIBODY,QUALITATIVE  ?HEPATITIS B SURFACE ANTIBODY, QUANTITATIVE  ?TROPONIN I (HIGH SENSITIVITY)  ? ?EKG ?ED ECG REPORT ?I, Naaman Plummer, the attending physician, personally viewed and interpreted this ECG. ?Date: 09/21/2021 ?EKG Time: 1135 ?Rate: 85 ?Rhythm: normal sinus rhythm ?QRS Axis: normal ?Intervals: normal ?ST/T Wave abnormalities: normal ?Narrative Interpretation: no evidence of acute ischemia ?RADIOLOGY ?ED MD interpretation: Single view portable chest x-ray shows bilateral pleural effusions with associated atelectasis ?-Agree with radiology assessment ?Official radiology report(s): ?DG Chest Port 1 View ? ?Result Date: 09/21/2021 ?CLINICAL DATA:  Shortness of breath. EXAM: PORTABLE CHEST 1 VIEW COMPARISON:  August 31, 2021. FINDINGS: Stable cardiomegaly. Right internal jugular dialysis catheter is unchanged. Stable bilateral pleural effusions are noted  with associated atelectasis. Bony thorax is unremarkable. IMPRESSION: Stable bilateral pleural effusions with associated atelectasis. Electronically Signed   By: Marijo Conception M.D.   On: 09/21/2021 12:29   ?PROCEDURES: ?Critical Care performed: Yes, see critical care procedure note(s) ?.1-3 Lead EKG Interpretation ?Performed by: Naaman Plummer, MD ?Authorized by: Naaman Plummer, MD  ? ?  Interpretation: normal   ?  ECG rate:  84 ?  ECG rate assessment: normal   ?  Rhythm: sinus rhythm   ?  Ectopy: none   ?  Conduction: normal   ?MEDICATIONS ORDERED IN ED: ?Medications  ?Chlorhexidine Gluconate Cloth 2 % PADS 6 each (has no administration in time range)  ? ?IMPRESSION / MDM / ASSESSMENT AND PLAN / ED COURSE  ?I reviewed the triage vital signs and the nursing notes. ?             ?               ?Patient is a 51 year old male with a history of residual disease who presents after missing 2 episodes of his dialysis ?Endorses dyspnea, endorses LE edema ?Endorses Non adherence to dialysis regimen ? ?Workup: ECG, CBC, BMP, Troponin, BNP, CXR ?Findings: ?EKG: No STEMI and no evidence of Brugada?s sign, delta wave, epsilon wave, significantly prolonged QTc, or malignant arrhythmia. ?BNP: >4500 ?CXR: Bibasilar pleural effusions ?Based on history, exam and findings, presentation most consistent with acute on chronic heart failure. Low suspicion for PNA, ACS, tamponade, aortic dissection. ?Interventions: Oxygen, dialysis ? ?Reassessment: Symptoms improved in ED with oxygen and diuresis ? ?Disposition (ESRD, missed HD): Plan dialysis with renal consult for expedited HD. ? ?  ?FINAL CLINICAL IMPRESSION(S) / ED DIAGNOSES  ? ?Final diagnoses:  ?Other hypervolemia  ?Shortness of breath  ?Dialysis patient, noncompliant  ? ?Rx / DC Orders  ? ?ED Discharge Orders   ? ? None  ? ?  ? ?Note:  This document was prepared using Dragon voice recognition software and may include unintentional dictation errors. ?  ?Naaman Plummer,  MD ?09/21/21 1417 ? ?

## 2021-09-21 NOTE — Progress Notes (Signed)
?[facility]  ?ROUNDING NOTE  ? ?Subjective:  ? ?Larry Morrison is a 51 year old African-American male with past medical history including pulmonary hypertension, combined CHF, CVA, hypertension, neurogenic bladder with right nephrostomy tube, and end-stage renal disease on hemodialysis.  Patient presents to the emergency department via EMS due to concerns from dialysis center and transportation company.  Patient reports shortness of breath. ? ?Patient is known to our practice and receives outpatient dialysis treatments at [facility] on a MWF schedule, supervised by Dr. Candiss Norse.  Patient has missed last 2 dialysis treatments, outpatient.  According to outpatient reports, patient was scheduled to be picked up by transportation company to go to dialysis treatment.  Transportation company contacted the dialysis clinic that the patient "did not look well".  Dialysis clinic advised them to contact EMS.  Patient seen lying on stretcher in ED, drowsy but arousable for short time.  Denies pain and discomfort.  Reports shortness of breath with activity.  States he was unable to go to dialysis on Monday due to being busy however unable to verbalize specific circumstances. ? ?Labs on ED arrival include bicarb 18, BUN 82, calcium 8.1 and creatinine 10.25 with GFR 6.  Troponin elevated.  Chest x-ray shows bilateral pleural effusions with atelectasis. ? ?We have been consulted to manage dialysis needs ? ? ?Objective:  ?Vital signs in last 24 hours:  ?Temp:  [97.8 ?F (36.6 ?C)] 97.8 ?F (36.6 ?C) (05/03 1129) ?Pulse Rate:  [83-90] 83 (05/03 1236) ?Resp:  [16] 16 (05/03 1236) ?BP: (154-161)/(96-106) 154/96 (05/03 1236) ?SpO2:  [92 %-97 %] 92 % (05/03 1236) ?Weight:  [54.4 kg] 54.4 kg (05/03 1131) ? ?Weight change:  ?Filed Weights  ? 09/21/21 1131  ?Weight: 54.4 kg  ? ? ?Intake/Output: ?No intake/output data recorded. ?  ?Intake/Output this shift: ? No intake/output data recorded. ? ?Physical Exam: ?General: NAD,  resting quietly  ?Head: Normocephalic, atraumatic. Moist oral mucosal membranes  ?Eyes: Anicteric  ?Lungs:  Basilar crackles, normal effort on room air  ?Heart: Regular rate and rhythm  ?Abdomen:  Soft, nontender, nondistended  ?Extremities: No peripheral edema.  ?Neurologic: Somnolent, moving all four extremities  ?Skin: No lesions  ?Access: Right IJ PermCath  ?Right nephrostomy tube. ? ?Basic Metabolic Panel: ?Recent Labs  ?Lab 09/21/21 ?1153  ?NA 139  ?K 4.8  ?CL 102  ?CO2 18*  ?GLUCOSE 81  ?BUN 82*  ?CREATININE 10.25*  ?CALCIUM 8.1*  ?PHOS 11.1*  ? ? ? ?Liver Function Tests: ?Recent Labs  ?Lab 09/21/21 ?1153  ?AST 28  ?ALT 34  ?ALKPHOS 182*  ?BILITOT 1.0  ?PROT 9.1*  ?ALBUMIN 3.1*  ? ? ?No results for input(s): LIPASE, AMYLASE in the last 168 hours. ?No results for input(s): AMMONIA in the last 168 hours. ? ?CBC: ?Recent Labs  ?Lab 09/21/21 ?1153  ?WBC 5.5  ?NEUTROABS 3.5  ?HGB 11.3*  ?HCT 36.2*  ?MCV 95.0  ?PLT 239  ? ? ? ?Cardiac Enzymes: ?No results for input(s): CKTOTAL, CKMB, CKMBINDEX, TROPONINI in the last 168 hours. ? ?BNP: ?Invalid input(s): POCBNP ? ?CBG: ?No results for input(s): GLUCAP in the last 168 hours. ? ?Microbiology: ?Results for orders placed or performed during the hospital encounter of 08/30/21  ?Resp Panel by RT-PCR (Flu A&B, Covid) Nasopharyngeal Swab     Status: None  ? Collection Time: 08/29/21 11:40 PM  ? Specimen: Nasopharyngeal Swab; Nasopharyngeal(NP) swabs in vial transport medium  ?Result Value Ref Range Status  ? SARS Coronavirus 2 by RT PCR NEGATIVE NEGATIVE  Final  ?  Comment: (NOTE) ?SARS-CoV-2 target nucleic acids are NOT DETECTED. ? ?The SARS-CoV-2 RNA is generally detectable in upper respiratory ?specimens during the acute phase of infection. The lowest ?concentration of SARS-CoV-2 viral copies this assay can detect is ?138 copies/mL. A negative result does not preclude SARS-Cov-2 ?infection and should not be used as the sole basis for treatment or ?other patient management  decisions. A negative result may occur with  ?improper specimen collection/handling, submission of specimen other ?than nasopharyngeal swab, presence of viral mutation(s) within the ?areas targeted by this assay, and inadequate number of viral ?copies(<138 copies/mL). A negative result must be combined with ?clinical observations, patient history, and epidemiological ?information. The expected result is Negative. ? ?Fact Sheet for Patients:  ?EntrepreneurPulse.com.au ? ?Fact Sheet for Healthcare Providers:  ?IncredibleEmployment.be ? ?This test is no t yet approved or cleared by the [place] FDA and  ?has been authorized for detection and/or diagnosis of SARS-CoV-2 by ?FDA under an Emergency Use Authorization (EUA). This EUA will remain  ?in effect (meaning this test can be used) for the duration of the ?COVID-19 declaration under Section 564(b)(1) of the Act, 21 ?U.S.C.section 360bbb-3(b)(1), unless the authorization is terminated  ?or revoked sooner.  ? ? ?  ? Influenza A by PCR NEGATIVE NEGATIVE Final  ? Influenza B by PCR NEGATIVE NEGATIVE Final  ?  Comment: (NOTE) ?The Xpert Xpress SARS-CoV-2/FLU/RSV plus assay is intended as an aid ?in the diagnosis of influenza from Nasopharyngeal swab specimens and ?should not be used as a sole basis for treatment. Nasal washings and ?aspirates are unacceptable for Xpert Xpress SARS-CoV-2/FLU/RSV ?testing. ? ?Fact Sheet for Patients: ?EntrepreneurPulse.com.au ? ?Fact Sheet for Healthcare Providers: ?IncredibleEmployment.be ? ?This test is not yet approved or cleared by the [place] FDA and ?has been authorized for detection and/or diagnosis of SARS-CoV-2 by ?FDA under an Emergency Use Authorization (EUA). This EUA will remain ?in effect (meaning this test can be used) for the duration of the ?COVID-19 declaration under Section 564(b)(1) of the Act, 21 U.S.C. ?section 360bbb-3(b)(1), unless the  authorization is terminated or ?revoked. ? ?Performed at [facility], [place], ?[place] [address] ?  ?Urine Culture     Status: Abnormal  ? Collection Time: 08/30/21  2:25 AM  ? Specimen: Urine, Clean Catch  ?Result Value Ref Range Status  ? Specimen Description   Final  ?  URINE, CLEAN CATCH ?Performed at [facility], [address], [place], [place] [address] ?  ? Special Requests   Final  ?  NONE ?Performed at [facility], [address] [place], [place] [address] ?  ? Culture (A)  Final  ?  <10,000 COLONIES/mL INSIGNIFICANT GROWTH ?Performed at [facility], [place] [address], [place], [place] [address] ?  ? Report Status 08/31/2021 FINAL  Final  ?Culture, blood (routine x 2)     Status: None  ? Collection Time: 08/30/21  2:56 AM  ? Specimen: BLOOD  ?Result Value Ref Range Status  ? Specimen Description BLOOD RIGHT FOREARM  Final  ? Special Requests   Final  ?  BOTTLES DRAWN AEROBIC AND ANAEROBIC Blood Culture adequate volume  ? Culture   Final  ?  NO GROWTH 5 DAYS ?Performed at [facility], [address] [place], [place] [address] ?  ? Report Status 09/04/2021 FINAL  Final  ?Culture, blood (routine x 2)     Status: None  ? Collection Time: 08/30/21 11:47 PM  ?  Specimen: BLOOD  ?Result Value Ref Range Status  ? Specimen Description BLOOD RIGHT FOREARM  Final  ? Special Requests   Final  ?  BOTTLES DRAWN AEROBIC AND ANAEROBIC Blood Culture adequate volume  ? Culture   Final  ?  NO GROWTH 5 DAYS ?Performed at [facility], [address] [place], [place] [address] ?  ? Report Status 09/04/2021 FINAL  Final  ? ? ?Coagulation Studies: ?No results for input(s): LABPROT, INR in the last 72 hours. ? ?Urinalysis: ?No results for input(s): COLORURINE, LABSPEC, [place], GLUCOSEU, HGBUR, BILIRUBINUR, KETONESUR, PROTEINUR, UROBILINOGEN, NITRITE, LEUKOCYTESUR in the last 72 hours. ? ?Invalid input(s): APPERANCEUR ?  ? ? ?Imaging: ?DG Chest Port 1  View ? ?Result Date: 09/21/2021 ?CLINICAL DATA:  Shortness of breath. EXAM: PORTABLE CHEST 1 VIEW COMPARISON:  August 31, 2021. FINDINGS: Stable cardiomegaly. Right internal jugular dialysis catheter is unchang

## 2021-09-21 NOTE — ED Triage Notes (Signed)
EMS called to pt's home by dialysis center after pt missed 2 appointments. Pt reports he missed Monday because he was busy, but missed today because he began feeling weak last night. Pt is tachypnic on arrival and reports feeling a little more short of breath than normal. Pt is wheelchair bound and lives with his brother. Pt A&O but poor historian. Pt reports having stent placed about 1 year ago. Site appears infected at this time with purulent smell. Pt reports first noticing this last night as well.  ?

## 2021-09-21 NOTE — Progress Notes (Signed)
Hemodialysis Post Treatment Note: ? ?Tx date:09/21/2021 ?Tx time: 2 hours and 26 minutes ?Access: right CVC ?UF Removed: 1275ml ? ?Note: ?HD not completed ? ?15:00 HD started with 350 BFR ? ?17:30 BFR lowered to 300 due to high venous pressure  ? ?17:45 BFR lowered again to 250 due to high venous pressure ? ?17:51 tx terminated due to visible clot on venous and arterial  chamber as well as in the dialyzer causing high venous pressure. Blood returned to prevenyt blood loss. Dr . Holley Raring was notifiied. ? ? ? ? ? ? ? ? ? ? ? ?  ?

## 2021-09-21 NOTE — ED Provider Notes (Signed)
Procedures ? ?  ? ?----------------------------------------- ?7:53 PM on 09/21/2021 ?----------------------------------------- ?Patient returned from dialysis.  1200 mL of UF.  Patient states that he feels much better, feels fine.  Lungs are clear to auscultation.  Normal peripheral pulses, regular heart rate.  Requests food. ? ?I noted his feet to be very malodorous, and removed his socks and shoes for exam.  He has dry flaky skin but no wounds, no evidence of ischemia or infection. ? ?Confirmed with nephrology that plan is for discharge.  He has outpatient surgery scheduled in 2 days for dialysis graft placement in left upper extremity. ? ? ?  ?Carrie Mew, MD ?09/21/21 1954 ? ?

## 2021-09-23 ENCOUNTER — Other Ambulatory Visit: Payer: Self-pay

## 2021-09-23 ENCOUNTER — Ambulatory Visit: Payer: Medicaid Other

## 2021-09-23 ENCOUNTER — Inpatient Hospital Stay
Admission: RE | Admit: 2021-09-23 | Discharge: 2021-10-01 | DRG: 264 | Disposition: A | Discharge status: Home / Self Care | Payer: Medicaid Other | Attending: Osteopathic Medicine | Admitting: Osteopathic Medicine

## 2021-09-23 ENCOUNTER — Encounter
Admission: RE | Disposition: A | Discharge status: Home / Self Care | Payer: Self-pay | Source: Home / Self Care | Attending: Internal Medicine

## 2021-09-23 ENCOUNTER — Inpatient Hospital Stay: Payer: Medicaid Other | Admitting: Urgent Care

## 2021-09-23 ENCOUNTER — Encounter: Payer: Self-pay | Admitting: Vascular Surgery

## 2021-09-23 DIAGNOSIS — R64 Cachexia: Secondary | ICD-10-CM | POA: Diagnosis present

## 2021-09-23 DIAGNOSIS — Z8249 Family history of ischemic heart disease and other diseases of the circulatory system: Secondary | ICD-10-CM | POA: Diagnosis not present

## 2021-09-23 DIAGNOSIS — D631 Anemia in chronic kidney disease: Secondary | ICD-10-CM | POA: Diagnosis present

## 2021-09-23 DIAGNOSIS — Z881 Allergy status to other antibiotic agents status: Secondary | ICD-10-CM | POA: Diagnosis not present

## 2021-09-23 DIAGNOSIS — N319 Neuromuscular dysfunction of bladder, unspecified: Secondary | ICD-10-CM | POA: Diagnosis present

## 2021-09-23 DIAGNOSIS — Z8616 Personal history of COVID-19: Secondary | ICD-10-CM | POA: Diagnosis not present

## 2021-09-23 DIAGNOSIS — Z01812 Encounter for preprocedural laboratory examination: Secondary | ICD-10-CM

## 2021-09-23 DIAGNOSIS — R778 Other specified abnormalities of plasma proteins: Secondary | ICD-10-CM | POA: Diagnosis not present

## 2021-09-23 DIAGNOSIS — R0602 Shortness of breath: Principal | ICD-10-CM | POA: Diagnosis present

## 2021-09-23 DIAGNOSIS — I639 Cerebral infarction, unspecified: Secondary | ICD-10-CM | POA: Diagnosis present

## 2021-09-23 DIAGNOSIS — Z20822 Contact with and (suspected) exposure to covid-19: Secondary | ICD-10-CM | POA: Diagnosis present

## 2021-09-23 DIAGNOSIS — I10 Essential (primary) hypertension: Secondary | ICD-10-CM | POA: Diagnosis present

## 2021-09-23 DIAGNOSIS — I5042 Chronic combined systolic (congestive) and diastolic (congestive) heart failure: Secondary | ICD-10-CM | POA: Diagnosis present

## 2021-09-23 DIAGNOSIS — Z905 Acquired absence of kidney: Secondary | ICD-10-CM

## 2021-09-23 DIAGNOSIS — Z87440 Personal history of urinary (tract) infections: Secondary | ICD-10-CM

## 2021-09-23 DIAGNOSIS — T82511A Breakdown (mechanical) of surgically created arteriovenous shunt, initial encounter: Principal | ICD-10-CM | POA: Diagnosis present

## 2021-09-23 DIAGNOSIS — R5381 Other malaise: Secondary | ICD-10-CM | POA: Diagnosis present

## 2021-09-23 DIAGNOSIS — Z8674 Personal history of sudden cardiac arrest: Secondary | ICD-10-CM | POA: Diagnosis not present

## 2021-09-23 DIAGNOSIS — Z79899 Other long term (current) drug therapy: Secondary | ICD-10-CM

## 2021-09-23 DIAGNOSIS — Y712 Prosthetic and other implants, materials and accessory cardiovascular devices associated with adverse incidents: Secondary | ICD-10-CM | POA: Diagnosis present

## 2021-09-23 DIAGNOSIS — N2581 Secondary hyperparathyroidism of renal origin: Secondary | ICD-10-CM | POA: Diagnosis present

## 2021-09-23 DIAGNOSIS — N186 End stage renal disease: Secondary | ICD-10-CM | POA: Diagnosis present

## 2021-09-23 DIAGNOSIS — Z993 Dependence on wheelchair: Secondary | ICD-10-CM

## 2021-09-23 DIAGNOSIS — I420 Dilated cardiomyopathy: Secondary | ICD-10-CM | POA: Diagnosis present

## 2021-09-23 DIAGNOSIS — Z992 Dependence on renal dialysis: Secondary | ICD-10-CM | POA: Diagnosis not present

## 2021-09-23 DIAGNOSIS — E876 Hypokalemia: Secondary | ICD-10-CM | POA: Diagnosis present

## 2021-09-23 DIAGNOSIS — I132 Hypertensive heart and chronic kidney disease with heart failure and with stage 5 chronic kidney disease, or end stage renal disease: Secondary | ICD-10-CM | POA: Diagnosis present

## 2021-09-23 DIAGNOSIS — Z9079 Acquired absence of other genital organ(s): Secondary | ICD-10-CM

## 2021-09-23 DIAGNOSIS — I248 Other forms of acute ischemic heart disease: Secondary | ICD-10-CM | POA: Diagnosis present

## 2021-09-23 DIAGNOSIS — Z716 Tobacco abuse counseling: Secondary | ICD-10-CM

## 2021-09-23 DIAGNOSIS — Z91158 Patient's noncompliance with renal dialysis for other reason: Secondary | ICD-10-CM

## 2021-09-23 DIAGNOSIS — F17200 Nicotine dependence, unspecified, uncomplicated: Secondary | ICD-10-CM | POA: Diagnosis present

## 2021-09-23 DIAGNOSIS — F17210 Nicotine dependence, cigarettes, uncomplicated: Secondary | ICD-10-CM | POA: Diagnosis present

## 2021-09-23 DIAGNOSIS — R7989 Other specified abnormal findings of blood chemistry: Secondary | ICD-10-CM | POA: Diagnosis present

## 2021-09-23 DIAGNOSIS — E440 Moderate protein-calorie malnutrition: Secondary | ICD-10-CM | POA: Diagnosis present

## 2021-09-23 DIAGNOSIS — Z7151 Drug abuse counseling and surveillance of drug abuser: Secondary | ICD-10-CM

## 2021-09-23 DIAGNOSIS — Z8673 Personal history of transient ischemic attack (TIA), and cerebral infarction without residual deficits: Secondary | ICD-10-CM

## 2021-09-23 DIAGNOSIS — Z681 Body mass index (BMI) 19 or less, adult: Secondary | ICD-10-CM | POA: Diagnosis not present

## 2021-09-23 DIAGNOSIS — F1410 Cocaine abuse, uncomplicated: Secondary | ICD-10-CM | POA: Diagnosis present

## 2021-09-23 DIAGNOSIS — I272 Pulmonary hypertension, unspecified: Secondary | ICD-10-CM | POA: Diagnosis present

## 2021-09-23 DIAGNOSIS — Z7401 Bed confinement status: Secondary | ICD-10-CM

## 2021-09-23 DIAGNOSIS — I5023 Acute on chronic systolic (congestive) heart failure: Secondary | ICD-10-CM | POA: Diagnosis not present

## 2021-09-23 DIAGNOSIS — Z936 Other artificial openings of urinary tract status: Secondary | ICD-10-CM

## 2021-09-23 LAB — POCT I-STAT, CHEM 8
BUN: 59 mg/dL — ABNORMAL HIGH (ref 6–20)
Calcium, Ion: 0.79 mmol/L — CL (ref 1.15–1.40)
Chloride: 107 mmol/L (ref 98–111)
Creatinine, Ser: 9.6 mg/dL — ABNORMAL HIGH (ref 0.61–1.24)
Glucose, Bld: 109 mg/dL — ABNORMAL HIGH (ref 70–99)
HCT: 39 % (ref 39.0–52.0)
Hemoglobin: 13.3 g/dL (ref 13.0–17.0)
Potassium: 4.1 mmol/L (ref 3.5–5.1)
Sodium: 142 mmol/L (ref 135–145)
TCO2: 22 mmol/L (ref 22–32)

## 2021-09-23 LAB — LACTIC ACID, PLASMA: Lactic Acid, Venous: 1.3 mmol/L (ref 0.5–1.9)

## 2021-09-23 LAB — URINE DRUG SCREEN, QUALITATIVE (ARMC ONLY)
Amphetamines, Ur Screen: NOT DETECTED
Barbiturates, Ur Screen: NOT DETECTED
Benzodiazepine, Ur Scrn: NOT DETECTED
Cannabinoid 50 Ng, Ur ~~LOC~~: NOT DETECTED
Cocaine Metabolite,Ur ~~LOC~~: POSITIVE — AB
MDMA (Ecstasy)Ur Screen: NOT DETECTED
Methadone Scn, Ur: NOT DETECTED
Opiate, Ur Screen: NOT DETECTED
Phencyclidine (PCP) Ur S: NOT DETECTED
Tricyclic, Ur Screen: NOT DETECTED

## 2021-09-23 LAB — CBC
HCT: 34.9 % — ABNORMAL LOW (ref 39.0–52.0)
Hemoglobin: 10.8 g/dL — ABNORMAL LOW (ref 13.0–17.0)
MCH: 29.6 pg (ref 26.0–34.0)
MCHC: 30.9 g/dL (ref 30.0–36.0)
MCV: 95.6 fL (ref 80.0–100.0)
Platelets: 208 10*3/uL (ref 150–400)
RBC: 3.65 MIL/uL — ABNORMAL LOW (ref 4.22–5.81)
RDW: 16.2 % — ABNORMAL HIGH (ref 11.5–15.5)
WBC: 6.6 10*3/uL (ref 4.0–10.5)
nRBC: 0 % (ref 0.0–0.2)

## 2021-09-23 LAB — TYPE AND SCREEN
ABO/RH(D): B POS
Antibody Screen: NEGATIVE

## 2021-09-23 LAB — RESP PANEL BY RT-PCR (FLU A&B, COVID) ARPGX2
Influenza A by PCR: NEGATIVE
Influenza B by PCR: NEGATIVE
SARS Coronavirus 2 by RT PCR: NEGATIVE

## 2021-09-23 LAB — TROPONIN I (HIGH SENSITIVITY)
Troponin I (High Sensitivity): 268 ng/L (ref <18)
Troponin I (High Sensitivity): 307 ng/L (ref <18)

## 2021-09-23 LAB — GLUCOSE, CAPILLARY: Glucose-Capillary: 76 mg/dL (ref 70–99)

## 2021-09-23 LAB — BRAIN NATRIURETIC PEPTIDE: B Natriuretic Peptide: >4500 pg/mL — ABNORMAL HIGH (ref 0.0–100.0)

## 2021-09-23 SURGERY — INSERTION OF ARTERIOVENOUS (AV) GORE-TEX GRAFT ARM
Anesthesia: General | Laterality: Left

## 2021-09-23 MED ORDER — ATORVASTATIN CALCIUM 20 MG PO TABS
40.0000 mg | ORAL_TABLET | Freq: Every day | ORAL | Status: DC
  Administered 2021-09-23 – 2021-09-29 (×4): 40 mg via ORAL
  Filled 2021-09-23 (×6): qty 2

## 2021-09-23 MED ORDER — PENTAFLUOROPROP-TETRAFLUOROETH EX AERO: 1.0000 "application " | INHALATION_SPRAY | CUTANEOUS | Status: DC | PRN

## 2021-09-23 MED ORDER — CHLORHEXIDINE GLUCONATE 0.12 % MT SOLN
OROMUCOSAL | Status: AC
  Filled 2021-09-23: qty 15

## 2021-09-23 MED ORDER — DIPHENHYDRAMINE HCL 50 MG/ML IJ SOLN: 12.5000 mg | Freq: Three times a day (TID) | INTRAMUSCULAR | Status: DC | PRN

## 2021-09-23 MED ORDER — HEPARIN SODIUM (PORCINE) 1000 UNIT/ML IJ SOLN
INTRAMUSCULAR | Status: AC
  Filled 2021-09-23: qty 10

## 2021-09-23 MED ORDER — CHLORHEXIDINE GLUCONATE CLOTH 2 % EX PADS: 6.0000 | MEDICATED_PAD | Freq: Once | CUTANEOUS | Status: DC

## 2021-09-23 MED ORDER — HEPARIN SODIUM (PORCINE) 1000 UNIT/ML DIALYSIS: 1000.0000 [IU] | INTRAMUSCULAR | Status: DC | PRN

## 2021-09-23 MED ORDER — SODIUM CHLORIDE 0.9 % IV SOLN: 100.0000 mL | INTRAVENOUS | Status: DC | PRN

## 2021-09-23 MED ORDER — CHLORHEXIDINE GLUCONATE 0.12 % MT SOLN: 15.0000 mL | Freq: Once | OROMUCOSAL | Status: DC

## 2021-09-23 MED ORDER — HYDRALAZINE HCL 20 MG/ML IJ SOLN
5.0000 mg | INTRAMUSCULAR | Status: DC | PRN
  Filled 2021-09-23: qty 0.25

## 2021-09-23 MED ORDER — NOREPINEPHRINE 4 MG/250ML-% IV SOLN
INTRAVENOUS | Status: AC
  Filled 2021-09-23: qty 250

## 2021-09-23 MED ORDER — HEPARIN SODIUM (PORCINE) 5000 UNIT/ML IJ SOLN
5000.0000 [IU] | Freq: Three times a day (TID) | INTRAMUSCULAR | Status: DC
  Filled 2021-09-23 (×9): qty 1

## 2021-09-23 MED ORDER — IPRATROPIUM-ALBUTEROL 0.5-2.5 (3) MG/3ML IN SOLN
3.0000 mL | Freq: Four times a day (QID) | RESPIRATORY_TRACT | Status: DC
  Administered 2021-09-23 – 2021-09-24 (×4): 3 mL via RESPIRATORY_TRACT
  Filled 2021-09-23 (×5): qty 3

## 2021-09-23 MED ORDER — CEFAZOLIN SODIUM-DEXTROSE 2-4 GM/100ML-% IV SOLN
INTRAVENOUS | Status: AC
  Filled 2021-09-23: qty 100

## 2021-09-23 MED ORDER — FAMOTIDINE 20 MG PO TABS
ORAL_TABLET | ORAL | Status: AC
  Filled 2021-09-23: qty 1

## 2021-09-23 MED ORDER — LIDOCAINE-PRILOCAINE 2.5-2.5 % EX CREA: 1.0000 "application " | TOPICAL_CREAM | CUTANEOUS | Status: DC | PRN

## 2021-09-23 MED ORDER — LIDOCAINE HCL (PF) 1 % IJ SOLN
5.0000 mL | INTRAMUSCULAR | Status: DC | PRN
  Filled 2021-09-23: qty 5

## 2021-09-23 MED ORDER — CHLORHEXIDINE GLUCONATE CLOTH 2 % EX PADS
6.0000 | MEDICATED_PAD | Freq: Every day | CUTANEOUS | Status: DC
  Administered 2021-09-25 – 2021-09-30 (×4): 6 via TOPICAL

## 2021-09-23 MED ORDER — ENSURE ENLIVE PO LIQD
237.0000 mL | Freq: Two times a day (BID) | ORAL | Status: DC
  Administered 2021-09-28 – 2021-10-01 (×3): 237 mL via ORAL
  Filled 2021-09-23 (×4): qty 237

## 2021-09-23 MED ORDER — SODIUM CHLORIDE 0.9 % IV SOLN: INTRAVENOUS | Status: DC

## 2021-09-23 MED ORDER — NICOTINE 21 MG/24HR TD PT24
21.0000 mg | MEDICATED_PATCH | TRANSDERMAL | Status: DC
Start: 2021-09-23 — End: 2021-10-01
  Filled 2021-09-23 (×5): qty 1

## 2021-09-23 MED ORDER — FAMOTIDINE 20 MG PO TABS: 20.0000 mg | ORAL_TABLET | Freq: Once | ORAL | Status: DC

## 2021-09-23 MED ORDER — ASPIRIN EC 81 MG PO TBEC
81.0000 mg | DELAYED_RELEASE_TABLET | Freq: Every day | ORAL | Status: DC
Start: 2021-09-23 — End: 2021-10-01
  Administered 2021-09-23 – 2021-09-29 (×4): 81 mg via ORAL
  Filled 2021-09-23 (×6): qty 1

## 2021-09-23 MED ORDER — ONDANSETRON HCL 4 MG/2ML IJ SOLN: 4.0000 mg | Freq: Once | INTRAMUSCULAR | Status: DC | PRN

## 2021-09-23 MED ORDER — ALBUTEROL SULFATE (2.5 MG/3ML) 0.083% IN NEBU
3.0000 mL | INHALATION_SOLUTION | RESPIRATORY_TRACT | Status: DC | PRN
Start: 2021-09-23 — End: 2021-09-23
  Filled 2021-09-23: qty 3

## 2021-09-23 MED ORDER — ACETAMINOPHEN 325 MG PO TABS
650.0000 mg | ORAL_TABLET | Freq: Four times a day (QID) | ORAL | Status: DC | PRN
Start: 2021-09-23 — End: 2021-10-01
  Administered 2021-10-01: 650 mg via ORAL

## 2021-09-23 MED ORDER — DM-GUAIFENESIN ER 30-600 MG PO TB12
1.0000 | ORAL_TABLET | Freq: Two times a day (BID) | ORAL | Status: DC | PRN
  Filled 2021-09-23: qty 1

## 2021-09-23 MED ORDER — CEFAZOLIN SODIUM-DEXTROSE 2-4 GM/100ML-% IV SOLN: 2.0000 g | INTRAVENOUS | Status: DC

## 2021-09-23 MED ORDER — ORAL CARE MOUTH RINSE: 15.0000 mL | Freq: Once | OROMUCOSAL | Status: DC

## 2021-09-23 MED ORDER — HYDROMORPHONE HCL 1 MG/ML IJ SOLN: 1.0000 mg | Freq: Once | INTRAMUSCULAR | Status: DC | PRN

## 2021-09-23 MED ORDER — ALTEPLASE 2 MG IJ SOLR: 2.0000 mg | Freq: Once | INTRAMUSCULAR | Status: DC | PRN

## 2021-09-23 MED ORDER — ALBUTEROL SULFATE HFA 108 (90 BASE) MCG/ACT IN AERS
1.0000 | INHALATION_SPRAY | RESPIRATORY_TRACT | Status: DC | PRN
  Filled 2021-09-23: qty 6.7

## 2021-09-23 SURGICAL SUPPLY — 54 items
APPLIER CLIP 11 MED OPEN (CLIP)
APPLIER CLIP 9.375 SM OPEN (CLIP)
BAG DECANTER FOR FLEXI CONT (MISCELLANEOUS) ×2 IMPLANT
BLADE SURG SZ11 CARB STEEL (BLADE) ×2 IMPLANT
BOOT SUTURE AID YELLOW STND (SUTURE) ×2 IMPLANT
BRUSH SCRUB EZ  4% CHG (MISCELLANEOUS) ×1
BRUSH SCRUB EZ 4% CHG (MISCELLANEOUS) ×1 IMPLANT
CHLORAPREP W/TINT 26 (MISCELLANEOUS) ×2 IMPLANT
CLIP APPLIE 11 MED OPEN (CLIP) IMPLANT
CLIP APPLIE 9.375 SM OPEN (CLIP) IMPLANT
DERMABOND ADVANCED (GAUZE/BANDAGES/DRESSINGS) ×1
DERMABOND ADVANCED .7 DNX12 (GAUZE/BANDAGES/DRESSINGS) ×1 IMPLANT
DRESSING SURGICEL FIBRLLR 1X2 (HEMOSTASIS) ×1 IMPLANT
DRSG SURGICEL FIBRILLAR 1X2 (HEMOSTASIS) ×2
ELECT CAUTERY BLADE 6.4 (BLADE) ×2 IMPLANT
ELECT REM PT RETURN 9FT ADLT (ELECTROSURGICAL) ×2
ELECTRODE REM PT RTRN 9FT ADLT (ELECTROSURGICAL) ×1 IMPLANT
GLOVE SURG SYN 8.0 (GLOVE) ×2 IMPLANT
GOWN STRL REUS W/ TWL LRG LVL3 (GOWN DISPOSABLE) ×1 IMPLANT
GOWN STRL REUS W/ TWL XL LVL3 (GOWN DISPOSABLE) ×1 IMPLANT
GOWN STRL REUS W/TWL LRG LVL3 (GOWN DISPOSABLE) ×1
GOWN STRL REUS W/TWL XL LVL3 (GOWN DISPOSABLE) ×1
GRAFT PROPATEN STD WALL 4 7X45 (Vascular Products) ×1 IMPLANT
IV NS 500ML (IV SOLUTION) ×1
IV NS 500ML BAXH (IV SOLUTION) ×1 IMPLANT
KIT TURNOVER KIT A (KITS) ×2 IMPLANT
LABEL OR SOLS (LABEL) ×2 IMPLANT
LOOP RED MAXI  1X406MM (MISCELLANEOUS) ×1
LOOP VESSEL MAXI 1X406 RED (MISCELLANEOUS) ×1 IMPLANT
LOOP VESSEL MINI 0.8X406 BLUE (MISCELLANEOUS) ×2 IMPLANT
LOOPS BLUE MINI 0.8X406MM (MISCELLANEOUS) ×2
MANIFOLD NEPTUNE II (INSTRUMENTS) ×2 IMPLANT
NEEDLE FILTER BLUNT 18X 1/2SAF (NEEDLE) ×1
NEEDLE FILTER BLUNT 18X1 1/2 (NEEDLE) ×1 IMPLANT
NS IRRIG 500ML POUR BTL (IV SOLUTION) ×2 IMPLANT
PACK EXTREMITY ARMC (MISCELLANEOUS) ×2 IMPLANT
PAD PREP 24X41 OB/GYN DISP (PERSONAL CARE ITEMS) ×2 IMPLANT
STOCKINETTE STRL 4IN 9604848 (GAUZE/BANDAGES/DRESSINGS) ×2 IMPLANT
SUT GTX CV-6 30 (SUTURE) ×4 IMPLANT
SUT MNCRL+ 5-0 UNDYED PC-3 (SUTURE) ×1 IMPLANT
SUT MONOCRYL 5-0 (SUTURE) ×1
SUT PROLENE 6 0 BV (SUTURE) ×4 IMPLANT
SUT SILK 2 0 (SUTURE) ×1
SUT SILK 2 0 SH (SUTURE) ×2 IMPLANT
SUT SILK 2-0 18XBRD TIE 12 (SUTURE) ×1 IMPLANT
SUT SILK 3 0 (SUTURE) ×1
SUT SILK 3-0 18XBRD TIE 12 (SUTURE) ×1 IMPLANT
SUT SILK 4 0 (SUTURE) ×1
SUT SILK 4-0 18XBRD TIE 12 (SUTURE) ×1 IMPLANT
SUT VIC AB 3-0 SH 27 (SUTURE) ×2
SUT VIC AB 3-0 SH 27X BRD (SUTURE) ×2 IMPLANT
SYR 20ML LL LF (SYRINGE) ×2 IMPLANT
SYR 3ML LL SCALE MARK (SYRINGE) ×2 IMPLANT
WATER STERILE IRR 500ML POUR (IV SOLUTION) ×2 IMPLANT
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
## 2021-09-23 NOTE — Assessment & Plan Note (Addendum)
?   continue Ensure ?

## 2021-09-23 NOTE — Assessment & Plan Note (Addendum)
?   Due to dilated cardiomyopathy.  2D echo 04/12/2021 showed EF<20, BNP > 4500.  Patient may have mild fluid overload. ?? Volume management per renal by dialysis ?? Cardiology consulted, No plans for ischemic evaluation, patient has reportedly declined this ?? Absolute cessation from cocaine and illicit drugs ?? Holding amlodipine on discharge d/t soft BP ?

## 2021-09-23 NOTE — Interval H&P Note (Signed)
History and Physical Interval Note: ? ?09/23/2021 ?9:10 AM ? ?Larry Morrison  has presented today for surgery, with the diagnosis of ESRD.  The various methods of treatment have been discussed with the patient and family. After consideration of risks, benefits and other options for treatment, the patient has consented to  Procedure(s): ?INSERTION OF ARTERIOVENOUS (AV) GORE-TEX GRAFT ARM BRACHIAL AXILLARY (Left) as a surgical intervention.  The patient's history has been reviewed, patient examined, no change in status, stable for surgery.  I have reviewed the patient's chart and labs.  Questions were answered to the patient's satisfaction.   ? ? ?[name] ? ? ?

## 2021-09-23 NOTE — Anesthesia Preprocedure Evaluation (Addendum)
Anesthesia Evaluation  ?Patient identified by MRN, date of birth, ID band ?Patient awake ? ? ? ?Reviewed: ?Allergy & Precautions, H&P , NPO status , reviewed documented beta blocker date and time  ? ?Airway ?Mallampati: II ? ?TM Distance: >3 FB ?Neck ROM: full ? ? ? Dental ? ?(+) Poor Dentition, Chipped, Missing ?Very poor teeth:   ?Pulmonary ?shortness of breath, Current Smoker and Patient abstained from smoking.,  ?H/o pleural effusion- s/p thoracentesis with a 900 cc 10/22 ? ?Recent treatment for PNA ? ?Pt with cough. Had cough previous admission. Denies worsening SOB ?  ?Pulmonary exam normal ? ? ? ? ? ? ? Cardiovascular ?hypertension, Pt. on medications ?pulmonary hypertension (mild)+CHF (04/10/2021: EF <20%; global HK, mild PAH, LA sev dilated; mod-sev TR; G2DD.) and + DOE  ?(-) Orthopnea Normal cardiovascular exam ? ?H/O cardiac arrest: during HD Tx at [facility] --> went into PEA cardiac arrest and was intubated; favored to be secondary to acute respiratory failure due to volume overload and HYPERkalemia associated with non-compliance with HD schedule. ? ?Prolonged QT interval ?  ?Neuro/Psych ?CVA (Righted weakness, subjective. ) negative psych ROS  ? GI/Hepatic ?GERD  ,(+)  ?  ? substance abuse ? cocaine use and marijuana use,   ?Endo/Other  ? ? Renal/GU ?Dialysis and ESRFRenal disease  ? ?neurogenic bladder with right nephrostomy tube ? ?  ?Musculoskeletal ?Generalized weakness ?- wheelchair bound at baseline  ? Abdominal ?Normal abdominal exam  (+)   ?Peds ? Hematology ? ?(+) Blood dyscrasia, anemia ,   ?Anesthesia Other Findings ?Pt with recent ED visit due to SOB as a transfer from dialysis. He had missed his recent HD sessions. He had UF HD on 5/3 that was non completed, 1200 mL of UF obtained.  ? ?Summary of ED visit: ?Workup: ECG, CBC, BMP, Troponin, BNP, CXR ?Findings: ?EKG: No STEMI and no evidence of Brugada?s sign, delta wave, epsilon wave, significantly prolonged QTc,  or malignant arrhythmia. ?BNP: >4500 ?CXR: Bibasilar pleural effusions ?Based on history, exam and findings, presentation most consistent with acute on chronic CHF. ?Reassessment: Symptoms improved in ED with oxygen and diuresis ? ?Admission from 08/2021 reviewed: ?Treated for UTI and Healthcare-associated pneumonia  ?Stable PE ?Dialysis treatment ? ? ?Past Medical History: ?No date: Anemia ?No date: Aneurysm ([place]) ?    Comment:  brain at age 51 ?No date: Dialysis patient [facility]) ?No date: Dyspnea ?No date: Foot drop ?No date: History of kidney stones ?No date: History of nephrostomy ?No date: Hypertension ?No date: Renal disorder ?No date: Stroke [facility]) ? ?Past Surgical History: ?08/07/2019: AV FISTULA PLACEMENT; Right ?    Comment:  Procedure: ARTERIOVENOUS (AV) FISTULA CREATION;   ?             Surgeon: Algernon Huxley, MD;  Location: [facility];  Service: ?             Vascular;  Laterality: Right; ?No date: NEPHRECTOMY; Left ?No date: NEPHRECTOMY ?10/27/2016: ORCHIECTOMY; Right ?    Comment:  Procedure: PSB ORCHIECTOMY;  Surgeon: Hollice Espy,  ?             MD;  Location: [facility];  Service: Urology;  Laterality:  ?             Right; ?10/27/2016: ORCHIOPEXY; Bilateral ?    Comment:  Procedure: ORCHIOPEXY ADULT;  Surgeon: Hollice Espy,  ?             MD;  Location: [facility];  Service: Urology;  Laterality:  ?             Bilateral; ?10/27/2016: SCROTAL EXPLORATION; Bilateral ?    Comment:  Procedure: SCROTUM EXPLORATION;  Surgeon: Erlene Quan,  ?             Caryl Pina, MD;  Location: [facility];  Service: Urology;   ?             Laterality: Bilateral; ? ? ? ? Reproductive/Obstetrics ? ?  ? ? ? ? ? ? ? ? ? ? ? ? ? ?  ?  ? ? ? ? ? ? ?Anesthesia Physical ? ?Anesthesia Plan ? ?ASA: 4 ? ?Anesthesia Plan: MAC  ? ?Post-op Pain Management:  Regional for Post-op pain and Regional block*  ? ?Induction: Intravenous ? ?PONV Risk Score and Plan: Treatment may vary due to age or medical condition and TIVA ? ?Airway Management Planned: Nasal  Cannula and Natural Airway ? ?Additional Equipment:  ? ?Intra-op Plan:  ? ?Post-operative Plan:  ? ?Informed Consent:  ? ?Plan Discussed with: CRNA ? ?Anesthesia Plan Comments: (CASE CANCELED due to concerns for PNA or acute on chronic heart failure, both of which have been problems recently. In addition, pt is cocaine + with elevated blood pressure today. Pt states his SOB and respiratory effort are normal for him. However, he has a worsening cough over the past few days and a runny nose that started today concerning for URI. Due to his poor state of health and multiple comorbidities, the hospitalist will continue to evaluate patient and treat accordingly. )  ? ? ? ?Anesthesia Quick Evaluation ? ?

## 2021-09-23 NOTE — Assessment & Plan Note (Addendum)
-  Nicotine patch 

## 2021-09-23 NOTE — Assessment & Plan Note (Addendum)
?   Resolved now ?? Likely multifactorial etiology, including ESRD, sCHF, bilateral pleural effusion,missed dialysis.  Patient had elevated troponin level 157 --> 268, but no chest pain, which is likely due to demand ischemia.  Dr. Holley Raring of renal was consulted for dialysis ?? Bronchodilators ?? Dialysis per renal ?

## 2021-09-23 NOTE — Assessment & Plan Note (Addendum)
?   Cocaine positive on UDS in the ED and on repeated UDS prior to graft procedure - cancelled procedure  ?? Amlodipine for hypertension ? ?

## 2021-09-23 NOTE — Assessment & Plan Note (Addendum)
?   Nephrology following for dialysis. ?? Vascular surgery AV graft - delayed d/t persistence of cocaine metabolites in blood, AV graft able to be done 09/30/21.   ?
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
## 2021-09-23 NOTE — Assessment & Plan Note (Addendum)
?   Troponin level 157 --> 268, no chest pain likely due to demand ischemia and poor clearance in the setting of ESRD.   ?? Cardiology consulted, started on aspirin and Lipitor ? ?

## 2021-09-23 NOTE — Progress Notes (Signed)
?[facility]  ?ROUNDING NOTE  ? ?Subjective:  ? ?Larry Morrison is a 51 year old African-American male with past medical history including pulmonary hypertension, combined CHF, CVA, hypertension, neurogenic bladder with right nephrostomy tube, and end-stage renal disease on hemodialysis.  Patient presented to vascular surgery today for placement of an AV Gore-Tex graft for dialysis use.  Treatment team concerned about frail appearance of patient intolerance and scheduled procedure.  Patient will be admitted for AV graft malfunction ([place]) [G86.761P] ?SOB (shortness of breath) [R06.02] ? ?Patient is known to our practice and receives outpatient dialysis treatments at [facility] on a MWF schedule, supervised by Dr. Candiss Norse.  Patient recently presented to the emergency department with complaints of shortness of breath after missing 2 dialysis treatments.  Patient received dialysis at that time and was discharged.  Patient was scheduled to receive dialysis tomorrow due to scheduled procedure today.  Scheduled procedure haled today to allow for optimized health through the weekend.  Patient seen resting in PACU, denies current pain.  Currently demanding ice cream. ? ?Labs stable for renal patient.  Chest x-ray does show bilateral pleural effusions left greater than right. ? ?We have been consulted to manage dialysis needs ? ? ?Objective:  ?Vital signs in last 24 hours:  ?Temp:  [97.8 ?F (36.6 ?C)-98.4 ?F (36.9 ?C)] 98.4 ?F (36.9 ?C) (05/05 1300) ?Pulse Rate:  [91-94] 91 (05/05 1300) ?Resp:  [16-18] 18 (05/05 1300) ?BP: (153-191)/(80-108) 153/108 (05/05 1300) ?SpO2:  [100 %] 100 % (05/05 1300) ?Weight:  [50 kg] 50 kg (05/05 0853) ? ?Weight change:  ?Filed Weights  ? 09/23/21 0853  ?Weight: 50 kg  ? ? ?Intake/Output: ?No intake/output data recorded. ?  ?Intake/Output this shift: ? No intake/output data recorded. ? ?Physical Exam: ?General: NAD, resting quietly  ?Head: Normocephalic, atraumatic. Moist oral  mucosal membranes  ?Eyes: Anicteric  ?Lungs:  Clear to auscultation, normal effort on room air  ?Heart: Regular rate and rhythm  ?Abdomen:  Soft, nontender, nondistended  ?Extremities: No peripheral edema.  ?Neurologic: Somnolent, moving all four extremities  ?Skin: No lesions  ?Access: Right IJ PermCath  ?Right nephrostomy tube. ? ?Basic Metabolic Panel: ?Recent Labs  ?Lab 09/21/21 ?1153 09/23/21 ?0834  ?NA 139 142  ?K 4.8 4.1  ?CL 102 107  ?CO2 18*  --   ?GLUCOSE 81 109*  ?BUN 82* 59*  ?CREATININE 10.25* 9.60*  ?CALCIUM 8.1*  --   ?PHOS 11.1*  --   ? ? ? ?Liver Function Tests: ?Recent Labs  ?Lab 09/21/21 ?1153  ?AST 28  ?ALT 34  ?ALKPHOS 182*  ?BILITOT 1.0  ?PROT 9.1*  ?ALBUMIN 3.1*  ? ? ?No results for input(s): LIPASE, AMYLASE in the last 168 hours. ?No results for input(s): AMMONIA in the last 168 hours. ? ?CBC: ?Recent Labs  ?Lab 09/21/21 ?1153 09/23/21 ?5093 09/23/21 ?2671  ?WBC 5.5  --  6.6  ?NEUTROABS 3.5  --   --   ?HGB 11.3* 13.3 10.8*  ?HCT 36.2* 39.0 34.9*  ?MCV 95.0  --  95.6  ?PLT 239  --  208  ? ? ? ?Cardiac Enzymes: ?No results for input(s): CKTOTAL, CKMB, CKMBINDEX, TROPONINI in the last 168 hours. ? ?BNP: ?Invalid input(s): POCBNP ? ?CBG: ?Recent Labs  ?Lab 09/23/21 ?0810  ?GLUCAP 76  ? ? ?Microbiology: ?Results for orders placed or performed during the hospital encounter of 08/30/21  ?Resp Panel by RT-PCR (Flu A&B, Covid) Nasopharyngeal Swab     Status: None  ? Collection Time: 08/29/21 11:40  PM  ? Specimen: Nasopharyngeal Swab; Nasopharyngeal(NP) swabs in vial transport medium  ?Result Value Ref Range Status  ? SARS Coronavirus 2 by RT PCR NEGATIVE NEGATIVE Final  ?  Comment: (NOTE) ?SARS-CoV-2 target nucleic acids are NOT DETECTED. ? ?The SARS-CoV-2 RNA is generally detectable in upper respiratory ?specimens during the acute phase of infection. The lowest ?concentration of SARS-CoV-2 viral copies this assay can detect is ?138 copies/mL. A negative result does not preclude SARS-Cov-2 ?infection  and should not be used as the sole basis for treatment or ?other patient management decisions. A negative result may occur with  ?improper specimen collection/handling, submission of specimen other ?than nasopharyngeal swab, presence of viral mutation(s) within the ?areas targeted by this assay, and inadequate number of viral ?copies(<138 copies/mL). A negative result must be combined with ?clinical observations, patient history, and epidemiological ?information. The expected result is Negative. ? ?Fact Sheet for Patients:  ?EntrepreneurPulse.com.au ? ?Fact Sheet for Healthcare Providers:  ?IncredibleEmployment.be ? ?This test is no t yet approved or cleared by the [place] FDA and  ?has been authorized for detection and/or diagnosis of SARS-CoV-2 by ?FDA under an Emergency Use Authorization (EUA). This EUA will remain  ?in effect (meaning this test can be used) for the duration of the ?COVID-19 declaration under Section 564(b)(1) of the Act, 21 ?U.S.C.section 360bbb-3(b)(1), unless the authorization is terminated  ?or revoked sooner.  ? ? ?  ? Influenza A by PCR NEGATIVE NEGATIVE Final  ? Influenza B by PCR NEGATIVE NEGATIVE Final  ?  Comment: (NOTE) ?The Xpert Xpress SARS-CoV-2/FLU/RSV plus assay is intended as an aid ?in the diagnosis of influenza from Nasopharyngeal swab specimens and ?should not be used as a sole basis for treatment. Nasal washings and ?aspirates are unacceptable for Xpert Xpress SARS-CoV-2/FLU/RSV ?testing. ? ?Fact Sheet for Patients: ?EntrepreneurPulse.com.au ? ?Fact Sheet for Healthcare Providers: ?IncredibleEmployment.be ? ?This test is not yet approved or cleared by the [place] FDA and ?has been authorized for detection and/or diagnosis of SARS-CoV-2 by ?FDA under an Emergency Use Authorization (EUA). This EUA will remain ?in effect (meaning this test can be used) for the duration of the ?COVID-19 declaration  under Section 564(b)(1) of the Act, 21 U.S.C. ?section 360bbb-3(b)(1), unless the authorization is terminated or ?revoked. ? ?Performed at [facility], [place], ?[place] [address] ?  ?Urine Culture     Status: Abnormal  ? Collection Time: 08/30/21  2:25 AM  ? Specimen: Urine, Clean Catch  ?Result Value Ref Range Status  ? Specimen Description   Final  ?  URINE, CLEAN CATCH ?Performed at [facility], [address] [place], [place] [address] ?  ? Special Requests   Final  ?  NONE ?Performed at [facility], [address] [place], [place] [address] ?  ? Culture (A)  Final  ?  <10,000 COLONIES/mL INSIGNIFICANT GROWTH ?Performed at [facility], [place] [address], [place], [place] [address] ?  ? Report Status 08/31/2021 FINAL  Final  ?Culture, blood (routine x 2)     Status: None  ? Collection Time: 08/30/21  2:56 AM  ? Specimen: BLOOD  ?Result Value Ref Range Status  ? Specimen Description BLOOD RIGHT FOREARM  Final  ? Special Requests   Final  ?  BOTTLES DRAWN AEROBIC AND ANAEROBIC Blood Culture adequate volume  ? Culture   Final  ?  NO GROWTH 5 DAYS ?Performed at [facility], [address], [place], [place] [address] ?  ?  Report Status 09/04/2021 FINAL  Final  ?Culture, blood (routine x 2)     Status: None  ? Collection Time: 08/30/21 11:47 PM  ? Specimen: BLOOD  ?Result Value Ref Range Status  ? Specimen Description BLOOD RIGHT FOREARM  Final  ? Special Requests   Final  ?  BOTTLES DRAWN AEROBIC AND ANAEROBIC Blood Culture adequate volume  ? Culture   Final  ?  NO GROWTH 5 DAYS ?Performed at [facility], [address], [place], [place] [address] ?  ? Report Status 09/04/2021 FINAL  Final  ? ? ?Coagulation Studies: ?No results for input(s): LABPROT, INR in the last 72 hours. ? ?Urinalysis: ?No results for input(s): COLORURINE, LABSPEC, [place], GLUCOSEU, HGBUR, BILIRUBINUR, KETONESUR, PROTEINUR, UROBILINOGEN, NITRITE, LEUKOCYTESUR in the  last 72 hours. ? ?Invalid input(s): APPERANCEUR ?  ? ? ?Imaging: ?DG Chest Portable 2 Views ? ?Result Date: 09/23/2021 ?CLINICAL DATA:  End-stage renal disease. EXAM: CHEST  2 VIEW PORTABLE COMPARISON:  05/

## 2021-09-23 NOTE — Assessment & Plan Note (Addendum)
Hemoglobin stable Monitor CBC. 

## 2021-09-23 NOTE — Assessment & Plan Note (Addendum)
?    aspirin and Lipitor ?

## 2021-09-24 DIAGNOSIS — R0602 Shortness of breath: Secondary | ICD-10-CM | POA: Diagnosis not present

## 2021-09-24 DIAGNOSIS — I639 Cerebral infarction, unspecified: Secondary | ICD-10-CM

## 2021-09-24 DIAGNOSIS — F17200 Nicotine dependence, unspecified, uncomplicated: Secondary | ICD-10-CM

## 2021-09-24 DIAGNOSIS — R778 Other specified abnormalities of plasma proteins: Secondary | ICD-10-CM

## 2021-09-24 DIAGNOSIS — F1410 Cocaine abuse, uncomplicated: Secondary | ICD-10-CM

## 2021-09-24 DIAGNOSIS — D631 Anemia in chronic kidney disease: Secondary | ICD-10-CM

## 2021-09-24 DIAGNOSIS — E440 Moderate protein-calorie malnutrition: Secondary | ICD-10-CM

## 2021-09-24 DIAGNOSIS — I5023 Acute on chronic systolic (congestive) heart failure: Secondary | ICD-10-CM

## 2021-09-24 LAB — BASIC METABOLIC PANEL
Anion gap: 11 (ref 5–15)
BUN: 35 mg/dL — ABNORMAL HIGH (ref 6–20)
CO2: 28 mmol/L (ref 22–32)
Calcium: 7.7 mg/dL — ABNORMAL LOW (ref 8.9–10.3)
Chloride: 99 mmol/L (ref 98–111)
Creatinine, Ser: 4.81 mg/dL — ABNORMAL HIGH (ref 0.61–1.24)
GFR, Estimated: 14 mL/min — ABNORMAL LOW (ref >60)
Glucose, Bld: 85 mg/dL (ref 70–99)
Potassium: 3.5 mmol/L (ref 3.5–5.1)
Sodium: 138 mmol/L (ref 135–145)

## 2021-09-24 LAB — CBC
HCT: 31.4 % — ABNORMAL LOW (ref 39.0–52.0)
Hemoglobin: 9.7 g/dL — ABNORMAL LOW (ref 13.0–17.0)
MCH: 29.4 pg (ref 26.0–34.0)
MCHC: 30.9 g/dL (ref 30.0–36.0)
MCV: 95.2 fL (ref 80.0–100.0)
Platelets: 177 10*3/uL (ref 150–400)
RBC: 3.3 MIL/uL — ABNORMAL LOW (ref 4.22–5.81)
RDW: 16.4 % — ABNORMAL HIGH (ref 11.5–15.5)
WBC: 5.4 10*3/uL (ref 4.0–10.5)
nRBC: 0 % (ref 0.0–0.2)

## 2021-09-24 LAB — LIPID PANEL
Cholesterol: 99 mg/dL (ref 0–200)
HDL: 44 mg/dL (ref >40)
LDL Cholesterol: 42 mg/dL (ref 0–99)
Total CHOL/HDL Ratio: 2.3 RATIO
Triglycerides: 67 mg/dL (ref <150)
VLDL: 13 mg/dL (ref 0–40)

## 2021-09-24 LAB — HEMOGLOBIN A1C
Hgb A1c MFr Bld: 5.3 % (ref 4.8–5.6)
Mean Plasma Glucose: 105.41 mg/dL

## 2021-09-24 LAB — MAGNESIUM: Magnesium: 1.6 mg/dL — ABNORMAL LOW (ref 1.7–2.4)

## 2021-09-24 LAB — TROPONIN I (HIGH SENSITIVITY): Troponin I (High Sensitivity): 313 ng/L (ref <18)

## 2021-09-24 LAB — HEPATITIS B SURFACE ANTIGEN: Hepatitis B Surface Ag: NONREACTIVE

## 2021-09-24 LAB — HEPATITIS B SURFACE ANTIBODY,QUALITATIVE: Hep B S Ab: NONREACTIVE

## 2021-09-24 MED ORDER — IPRATROPIUM-ALBUTEROL 0.5-2.5 (3) MG/3ML IN SOLN: 3.0000 mL | Freq: Four times a day (QID) | RESPIRATORY_TRACT | Status: DC | PRN

## 2021-09-24 MED ORDER — POTASSIUM CHLORIDE CRYS ER 20 MEQ PO TBCR
20.0000 meq | EXTENDED_RELEASE_TABLET | Freq: Once | ORAL | Status: AC
  Administered 2021-09-24: 20 meq via ORAL
  Filled 2021-09-24: qty 1

## 2021-09-24 MED ORDER — AMLODIPINE BESYLATE 5 MG PO TABS
5.0000 mg | ORAL_TABLET | Freq: Every evening | ORAL | Status: DC
  Administered 2021-09-24 – 2021-09-29 (×6): 5 mg via ORAL
  Filled 2021-09-24 (×6): qty 1

## 2021-09-24 MED ORDER — EPOETIN ALFA 10000 UNIT/ML IJ SOLN
4000.0000 [IU] | INTRAMUSCULAR | Status: DC
  Administered 2021-09-26: 4000 [IU] via INTRAVENOUS

## 2021-09-24 MED ORDER — MAGNESIUM SULFATE IN D5W 1-5 GM/100ML-% IV SOLN
1.0000 g | Freq: Once | INTRAVENOUS | Status: AC
Start: 2021-09-24 — End: 2021-09-24
  Administered 2021-09-24: 1 g via INTRAVENOUS
  Filled 2021-09-24: qty 100

## 2021-09-24 MED ORDER — IRBESARTAN 150 MG PO TABS
150.0000 mg | ORAL_TABLET | Freq: Every evening | ORAL | Status: DC
  Administered 2021-09-24 – 2021-09-30 (×7): 150 mg via ORAL
  Filled 2021-09-24 (×7): qty 1

## 2021-09-24 MED ORDER — CALCIUM ACETATE (PHOS BINDER) 667 MG PO CAPS
1334.0000 mg | ORAL_CAPSULE | Freq: Three times a day (TID) | ORAL | Status: DC
  Administered 2021-09-24 – 2021-09-30 (×9): 1334 mg via ORAL
  Filled 2021-09-24 (×13): qty 2

## 2021-09-24 NOTE — Progress Notes (Signed)
Hemodialysis Post Treatment Note ?23 Sep 2024 ?Access: RIJ Catheter  ?UF Removed:   598 ml ?Treatment Hours:  2 hours  ?Next Scheduled Treatment: 09/25/21  ?Note: ?Patient completes unscheduled hemodialysis treatment. CVC intact, no signs of infection. Unable to maintain prescribed BFR due to high venous pressure. Labs drawn per order. Patient to return on 09/25/21 for full treatment.   ? ?

## 2021-09-24 NOTE — Hospital Course (Signed)
Larry Morrison is a 51 y.o. male with medical history significant of ESRD-HD (MWF), hypertension, stroke, cocaine abuse, tobacco abuse, pulmonary hypertension, CHF with EF of<20%, anemia, cardiac arrest, who presented to the ED on 09/23/2021 with progressively worsening with shortness of breath over several days.  He had presented for outpatient vascular procedure that was canceled due to his shortness of breath.  He was seen in ED on 5/3 due to shortness of breath, and was thought to be due to missing 2 of his dialysis.  Patient was dialyzed the day before presenting. ?  ?Of note, patient has previously had resection of the right brachial axillary AV graft.  He currently has no permanent extremity dialysis access.  He is maintained via PermCath which has been working but his dialysis runs have not been ideal. Patient is scheduled for creation of a left brachial axillary AV graft by Dr. Delana Meyer of vascular surgery, but this was canceled due to shortness of breath. ?  ?ED Course: WBC 6.6, BNP> 4500, UDS positive for cocaine, temperature normal, blood pressure 191/80, heart rate 94, RR 16, oxygen saturation 91-100% on room air.  X-ray showed stable bilateral pleural effusions, left greater than right, with associated atelectasis. ? ? ?

## 2021-09-24 NOTE — Progress Notes (Signed)
Nurse tech sent down to Dialysis to perform EKG on patient per order.  ?

## 2021-09-24 NOTE — Progress Notes (Signed)
?[facility]  ?ROUNDING NOTE  ? ?Subjective:  ? ?Larry Morrison is a 51 year old African-American male with past medical history including pulmonary hypertension, combined CHF, CVA, hypertension, neurogenic bladder with right nephrostomy tube, and end-stage renal disease on hemodialysis.   ? ?Patient presented to vascular surgery today for placement of an AV Gore-Tex graft for dialysis use.  Treatment team concerned about frail appearance of patient intolerance and scheduled procedure.  Patient will be admitted for AV graft malfunction ([place]) [E42.353I] ?SOB (shortness of breath) [R06.02] ? ?Patient is known to our practice and receives outpatient dialysis treatments at [facility] on a MWF schedule,   Patient recently presented to the emergency department with complaints of shortness of breath after missing 2 dialysis treatments.  Patient received dialysis at that time and was discharged.   ? ? ? ?Objective:  ?Vital signs in last 24 hours:  ?Temp:  [97.9 ?F (36.6 ?C)-99.3 ?F (37.4 ?C)] 98.5 ?F (36.9 ?C) (05/06 1443) ?Pulse Rate:  [82-99] 94 (05/06 0832) ?Resp:  [18-50] 18 (05/06 1540) ?BP: (145-177)/(82-109) 164/109 (05/06 0867) ?SpO2:  [90 %-100 %] 90 % (05/06 0832) ?Weight:  [49 kg] 49 kg (05/05 2300) ? ?Weight change:  ?Filed Weights  ? 09/23/21 0853 09/23/21 2300  ?Weight: 50 kg 49 kg  ? ? ?Intake/Output: ?I/O last 3 completed shifts: ?In: -  ?Out: 619 [[id number]] ?  ?Intake/Output this shift: ? Total I/O ?In: 180 [P.O.:180] ?Out: -  ? ?Physical Exam: ?General: NAD, resting quietly  ?Head: Normocephalic, atraumatic. Moist oral mucosal membranes  ?Eyes: Anicteric  ?Lungs:  Clear to auscultation, normal effort on room air  ?Heart: Regular rate and rhythm  ?Abdomen:  Soft, nontender, nondistended  ?Extremities: No peripheral edema.  ?Neurologic: Somnolent, moving all four extremities  ?Skin: No lesions  ?Access: Right IJ PermCath  ?Right nephrostomy tube. ? ?Basic Metabolic Panel: ?Recent Labs   ?Lab 09/21/21 ?1153 09/23/21 ?2458 09/24/21 ?0998  ?NA 139 142 138  ?K 4.8 4.1 3.5  ?CL 102 107 99  ?CO2 18*  --  28  ?GLUCOSE 81 109* 85  ?BUN 82* 59* 35*  ?CREATININE 10.25* 9.60* 4.81*  ?CALCIUM 8.1*  --  7.7*  ?PHOS 11.1*  --   --   ? ? ? ?Liver Function Tests: ?Recent Labs  ?Lab 09/21/21 ?1153  ?AST 28  ?ALT 34  ?ALKPHOS 182*  ?BILITOT 1.0  ?PROT 9.1*  ?ALBUMIN 3.1*  ? ? ?No results for input(s): LIPASE, AMYLASE in the last 168 hours. ?No results for input(s): AMMONIA in the last 168 hours. ? ?CBC: ?Recent Labs  ?Lab 09/21/21 ?1153 09/23/21 ?3382 09/23/21 ?5053 09/24/21 ?9767  ?WBC 5.5  --  6.6 5.4  ?NEUTROABS 3.5  --   --   --   ?HGB 11.3* 13.3 10.8* 9.7*  ?HCT 36.2* 39.0 34.9* 31.4*  ?MCV 95.0  --  95.6 95.2  ?PLT 239  --  208 177  ? ? ? ?Cardiac Enzymes: ?No results for input(s): CKTOTAL, CKMB, CKMBINDEX, TROPONINI in the last 168 hours. ? ?BNP: ?Invalid input(s): POCBNP ? ?CBG: ?Recent Labs  ?Lab 09/23/21 ?0810  ?GLUCAP 76  ? ? ? ?Microbiology: ?Results for orders placed or performed during the hospital encounter of 09/23/21  ?Resp Panel by RT-PCR (Flu A&B, Covid) Nasopharyngeal Swab     Status: None  ? Collection Time: 09/23/21  4:21 PM  ? Specimen: Nasopharyngeal Swab; Nasopharyngeal(NP) swabs in vial transport medium  ?Result Value Ref Range Status  ? SARS Coronavirus 2 by  RT PCR NEGATIVE NEGATIVE Final  ?  Comment: (NOTE) ?SARS-CoV-2 target nucleic acids are NOT DETECTED. ? ?The SARS-CoV-2 RNA is generally detectable in upper respiratory ?specimens during the acute phase of infection. The lowest ?concentration of SARS-CoV-2 viral copies this assay can detect is ?138 copies/mL. A negative result does not preclude SARS-Cov-2 ?infection and should not be used as the sole basis for treatment or ?other patient management decisions. A negative result may occur with  ?improper specimen collection/handling, submission of specimen other ?than nasopharyngeal swab, presence of viral mutation(s) within the ?areas  targeted by this assay, and inadequate number of viral ?copies(<138 copies/mL). A negative result must be combined with ?clinical observations, patient history, and epidemiological ?information. The expected result is Negative. ? ?Fact Sheet for Patients:  ?EntrepreneurPulse.com.au ? ?Fact Sheet for Healthcare Providers:  ?IncredibleEmployment.be ? ?This test is no t yet approved or cleared by the [place] FDA and  ?has been authorized for detection and/or diagnosis of SARS-CoV-2 by ?FDA under an Emergency Use Authorization (EUA). This EUA will remain  ?in effect (meaning this test can be used) for the duration of the ?COVID-19 declaration under Section 564(b)(1) of the Act, 21 ?U.S.C.section 360bbb-3(b)(1), unless the authorization is terminated  ?or revoked sooner.  ? ? ?  ? Influenza A by PCR NEGATIVE NEGATIVE Final  ? Influenza B by PCR NEGATIVE NEGATIVE Final  ?  Comment: (NOTE) ?The Xpert Xpress SARS-CoV-2/FLU/RSV plus assay is intended as an aid ?in the diagnosis of influenza from Nasopharyngeal swab specimens and ?should not be used as a sole basis for treatment. Nasal washings and ?aspirates are unacceptable for Xpert Xpress SARS-CoV-2/FLU/RSV ?testing. ? ?Fact Sheet for Patients: ?EntrepreneurPulse.com.au ? ?Fact Sheet for Healthcare Providers: ?IncredibleEmployment.be ? ?This test is not yet approved or cleared by the [place] FDA and ?has been authorized for detection and/or diagnosis of SARS-CoV-2 by ?FDA under an Emergency Use Authorization (EUA). This EUA will remain ?in effect (meaning this test can be used) for the duration of the ?COVID-19 declaration under Section 564(b)(1) of the Act, 21 U.S.C. ?section 360bbb-3(b)(1), unless the authorization is terminated or ?revoked. ? ?Performed at [facility], [place], ?[place] [address] ?  ? ? ?Coagulation Studies: ?No results for input(s): LABPROT,  INR in the last 72 hours. ? ?Urinalysis: ?No results for input(s): COLORURINE, LABSPEC, [place], GLUCOSEU, HGBUR, BILIRUBINUR, KETONESUR, PROTEINUR, UROBILINOGEN, NITRITE, LEUKOCYTESUR in the last 72 hours. ? ?Invalid input(s): APPERANCEUR ?  ? ? ?Imaging: ?DG Chest Portable 2 Views ? ?Result Date: 09/23/2021 ?CLINICAL DATA:  End-stage renal disease. EXAM: CHEST  2 VIEW PORTABLE COMPARISON:  09/21/2021 FINDINGS: Stable appearance of tunneled right-sided dialysis catheter. Right axillary venous stent again visualized. Stable bilateral pleural effusions, left greater than right of small to moderate volume. No overt pulmonary edema or pneumothorax. Associated bilateral lower lobe atelectasis. Visualized bony structures are unremarkable. Stable cardiac enlargement. IMPRESSION: Stable bilateral pleural effusions, left greater than right, with associated atelectasis. Electronically Signed   By: Aletta Edouard M.D.   On: 09/23/2021 14:03   ? ? ?Medications:  ? ? ? ? ? aspirin EC  81 mg Oral Daily  ? atorvastatin  40 mg Oral Daily  ? Chlorhexidine Gluconate Cloth  6 each Topical Daily  ? famotidine  20 mg Oral Once  ? feeding supplement  237 mL Oral BID BM  ? heparin  5,000 Units Subcutaneous Q8H  ? ipratropium-albuterol  3 mL Nebulization Q6H  ? nicotine  21 mg Transdermal Q24H  ? ? ? ?  Assessment/ Plan:  ?[name] is a 51 y.o.  male with past medical history including pulmonary hypertension, combined CHF, CVA, hypertension, neurogenic bladder with right nephrostomy tube, and end-stage renal disease on hemodialysis.   ? ?[facility]/MWF/Rt Permcath ? ?End stage renal disease requiring hemodialysis.  AV graft placement postponed till Monday.  Patient underwent dialysis treatment on Friday evening with 600 cc fluid removed.  Next dialysis treatment scheduled for Monday or Tuesday after surgery. ? ?2. Anemia of chronic kidney disease ?Macrocytic ?Lab Results  ?Component Value Date  ? HGB 9.7 (L) 09/24/2021  ?   ?Recieves mircera as outpt.   ?Epogen with hemodialysis treatments ? ?3. Secondary Hyperparathyroidism:  ?Lab Results  ?Component Value Date  ? CALCIUM 7.7 (L) 09/24/2021  ? CAION 0.79 (LL) 09/23/2021  ? [place]

## 2021-09-24 NOTE — TOC Initial Note (Signed)
Transition of Care (TOC) - Initial/Assessment Note  ? ? ?Patient Details  ?Name: Larry Morrison ?MRN: [id number] ?Date of Birth: 21-Feb-1971 ? ?Transition of Care (TOC) CM/SW Contact:    ?Patte Winkel E Dawnn Nam, LCSW ?Phone Number: ?09/24/2021, 9:54 AM ? ?Clinical Narrative:                Patient known to [facility] for recent admission. See below pasted note.  ? ?"Patient's brother lives with him and helps him with bathing, cooking, and cleaning.  Patient's brother works at [employer] in [place].   ?Patient goes to dialysis MWF, he uses Medicaid transportation, [place].   ?Patient walks some and uses his wheelchair some, he says he has been doing his own therapy so he does not want to have home health set up he would like to do it himself.   ?Patient has a nephrostomy tube that he has been caring for and reports he has had no problems with it." ? ?TOC can arrange EMS transport home if needed when DC.  ? ? ?Expected Discharge Plan: Home/Self Care ?Barriers to Discharge: Continued Medical Work up ? ? ?Patient Goals and CMS Choice ?  ?  ?  ? ?Expected Discharge Plan and Services ?Expected Discharge Plan: Home/Self Care ?  ?  ?  ?Living arrangements for the past 2 months: Apartment ?                ?  ?  ?  ?  ?  ?  ?  ?  ?  ?  ? ?Prior Living Arrangements/Services ?Living arrangements for the past 2 months: Apartment ?Lives with:: Siblings ?Patient language and need for interpreter reviewed:: Yes ?       ?Need for Family Participation in Patient Care: Yes (Comment) ?Care giver support system in place?: Yes (comment) ?Current home services: DME ?Criminal Activity/Legal Involvement Pertinent to Current Situation/Hospitalization: No - Comment as needed ? ?Activities of Daily Living ?Home Assistive Devices/Equipment: Wheelchair ?ADL Screening (condition at time of admission) ?Patient's cognitive ability adequate to safely complete daily activities?: Yes ?Is the patient deaf or have difficulty hearing?: No ?Does the patient have  difficulty seeing, even when wearing glasses/contacts?: No ?Does the patient have difficulty concentrating, remembering, or making decisions?: Yes ?Patient able to express need for assistance with ADLs?: Yes ?Does the patient have difficulty dressing or bathing?: Yes ?Independently performs ADLs?: No ?Communication: Independent ?Dressing (OT): Needs assistance ?Is this a change from baseline?: Pre-admission baseline ?Grooming: Needs assistance ?Is this a change from baseline?: Pre-admission baseline ?Feeding: Independent ?Bathing: Needs assistance ?Is this a change from baseline?: Pre-admission baseline ?Toileting: Needs assistance ?Is this a change from baseline?: Pre-admission baseline ?In/Out Bed: Independent with device (comment) ?Is this a change from baseline?: Pre-admission baseline ?Walks in Home: Independent with device (comment) ?Is this a change from baseline?: Pre-admission baseline ?Does the patient have difficulty walking or climbing stairs?: Yes ?Weakness of Legs: None ?Weakness of Arms/Hands: None ? ?Permission Sought/Granted ?  ?  ?   ?   ?   ?   ? ?Emotional Assessment ?  ?  ?  ?Orientation: : Oriented to Self, Oriented to Place, Oriented to  Time, Oriented to Situation ?Alcohol / Substance Use: Not Applicable ?Psych Involvement: No (comment) ? ?Admission diagnosis:  AV graft malfunction ([place]) [G31.517O] ?SOB (shortness of breath) [R06.02] ?Patient Active Problem List  ? Diagnosis Date Noted  ? ESRD on hemodialysis ([place])   ? Cocaine use disorder ([place])   ? Stroke [facility])   ?  Chronic combined systolic and diastolic CHF (congestive heart failure) ([place])   ? SOB (shortness of breath)   ? Prolonged QT interval 08/30/2021  ? Chronic combined systolic and diastolic heart failure ([place]) 06/30/2021  ? Fall 06/30/2021  ? ESRD (end stage renal disease) on dialysis [facility])   ? Malfunction of nephrostomy tube ([place])   ? Malnutrition of moderate degree 04/11/2021  ? Acute on chronic combined systolic and diastolic CHF  (congestive heart failure) ([place]) 04/09/2021  ? Protein-calorie malnutrition, severe 03/01/2021  ? Loculated pleural effusion 02/28/2021  ? 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 History of traumatic brain injury 09/07/2020  ? Hypoglycemia 09/04/2020  ? Uremia 09/04/2020  ? COVID-19 virus infection 06/18/2020  ? Thrombocytopenia ([place]) 06/18/2020  ? Elevated troponin 06/18/2020  ? Anemia in ESRD (end-stage renal disease) ([place]) 09/17/2019  ? Essential hypertension 07/15/2019  ? Testicular torsion   ? Pyuria 06/10/2016  ? Chronic pain following surgery or procedure 12/01/2015  ? 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[place]) 12/01/2015  ? Tobacco use disorder 12/01/2015  ? ESRD (end stage renal disease) ([place]) 04/28/2014  ? Obstructed nephrostomy tube ([place]) 10/23/2013  ? 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 Neurogenic bladder 02/09/1998  ? Urinary calculus 02/09/1998  ? 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 ?PCP:  [facility], [place] ?Pharmacy:   ?[place] ?[address] ?[place] [address] ?Phone: [phone] Fax: [phone] ? ? ? ? ?Social Determinants of Health (SDOH) Interventions ?  ? ?Readmission Risk Interventions ? ?  08/30/2021  ? 12:02 PM 04/12/2021  ? 12:59 PM 09/08/2020  ?  3:10 PM  ?Readmission Risk Prevention Plan  ?Transportation Screening Complete Complete Complete  ?Social Work Consult for [place] Planning/Counseling   Complete  ?Palliative Care Screening   Complete  ?Medication Review Press photographer) Complete Complete Complete  ?PCP or Specialist appointment within 3-5 days of discharge Complete Complete   ?[place] or Home Care Consult Complete Complete   ?[place] or Home Care Consult Pt Refusal Comments  has in home care aids   ?SW Recovery Care/Counseling Consult Complete Not Complete   ?SW Consult Not Complete Comments  pt intubated   ?Palliative Care Screening Not Applicable Not Complete    ?Comments  intubated   ?Skilled Nursing Facility Not Applicable Not Complete   ?SNF Comments  intubated   ? ? ? ?

## 2021-09-24 NOTE — Progress Notes (Signed)
?Progress Note ? ? ?Patient: Larry Morrison DZH:[id number] DOB: 07-07-1970 DOA: 09/23/2021     1 ?DOS: the patient was seen and examined on 09/24/2021 ?  ?Brief hospital course: ?Stephane Niemann is a 51 y.o. male with medical history significant of ESRD-HD (MWF), hypertension, stroke, cocaine abuse, tobacco abuse, pulmonary hypertension, CHF with EF of<20%, anemia, cardiac arrest, who presented to the ED on 09/23/2021 with progressively worsening with shortness of breath over several days.  He had presented for outpatient vascular procedure that was canceled due to his shortness of breath.  He was seen in ED on 5/3 due to shortness of breath, and was thought to be due to missing 2 of his dialysis.  Patient was dialyzed the day before presenting. ?  ?Of note, patient has previously had resection of the right brachial axillary AV graft.  He currently has no permanent extremity dialysis access.  He is maintained via PermCath which has been working but his dialysis runs have not been ideal. Patient is scheduled for creation of a left brachial axillary AV graft by Dr. Delana Meyer of vascular surgery, but this was canceled due to shortness of breath. ?  ?ED Course: WBC 6.6, BNP> 4500, UDS positive for cocaine, temperature normal, blood pressure 191/80, heart rate 94, RR 16, oxygen saturation 91-100% on room air.  X-ray showed stable bilateral pleural effusions, left greater than right, with associated atelectasis. ? ? ? ?Assessment and Plan: ?* SOB (shortness of breath) ?Etiology is not clear, likely multifactorial etiology, including ESRD, sCHF, bilateral pleural effusion.  Patient has elevated troponin level 157 --> 268, but no chest pain, which is likely due to demand ischemia.  Dr. Holley Raring of renal was consulted for dialysis ? ?-Admitted to telemetry bed as inpatient ?-Bronchodilators ?-Dialysis per renal ? ?ESRD on hemodialysis ([place]) ?Nephrology following for dialysis. ?Vascular surgery planning to create new AV graft once patient is  clinically improved ? ?Chronic combined systolic and diastolic CHF (congestive heart failure) ([place]) ?Due to dilated cardiomyopathy.  2D echo 04/12/2021 showed EF<20, BNP > 4500.  Patient may have mild fluid overload. ?-Volume management per renal by dialysis ?-Cardiology following ?-No plans for ischemic evaluation, patient has reportedly declined this ?-Absolute cessation from cocaine and illicit drugs ? ?Stroke [facility] ?- Start aspirin and Lipitor ? ?Cocaine use disorder ([place]) ?Cocaine positive on UDS in the ED. ?Amlodipine for hypertension ? ? ?Malnutrition of moderate degree ?- Start Ensure ? ?Elevated troponin ?Troponin level 157 --> 268.   ?No chest pain.   ?Likely due to demand ischemia and poor clearance in the setting of ESRD.  -Cardiology consulted, follow-up recommendations ?-Started on aspirin and Lipitor ?-Trend troponin ? ? ?Anemia in ESRD (end-stage renal disease) ([place]) ?Hemoglobin 10.8.  11.3 on 09/21/2021 ?-Follow-up by CBC ? ?Essential hypertension ?Continue amlodipine given cocaine abuse. ?Volume removal by dialysis ? ?Tobacco use disorder ?- Nicotine patch ? ? ? ? ?  ? ?Subjective: Patient was awake resting in bed but minimally conversant or interactive during encounter this morning.  He reports being tired because unable to sleep all night.  He does not really engage in answering questions for me.  On my way out of the room, he apologizes for being rude.  Denies any acute complaints at this time.  No chest pain. ? ?Physical Exam: ?Vitals:  ? 09/24/21 0609 09/24/21 0832 09/24/21 1408 09/24/21 1625  ?BP: (!) 155/105 (!) 164/109  (!) 155/85  ?Pulse: 88 94  98  ?Resp: 18 18  18   ?Temp: 98.8 ?F (  37.1 ?C) 98.5 ?F (36.9 ?C)  98.9 ?F (37.2 ?C)  ?TempSrc: Oral Oral  Oral  ?SpO2: 93% 90% 92% 96%  ?Weight:      ?Height:      ? ?General exam: awake, alert, no acute distress, frail and chronically ill-appearing ?HEENT: moist mucus membranes, hearing grossly normal  ?Respiratory system: CTAB diminished bases, no  wheezes, rales or rhonchi, normal respiratory effort at rest. ?Cardiovascular system: normal S1/S2, RRR, no pedal edema.   ?Gastrointestinal system: Not examined per patient's wishes ?Central nervous system: A&O x3. no gross focal neurologic deficits, normal speech ?Extremities: moves all, no edema, normal tone ?Skin: dry, intact, no rashes seen on visualized skin ?Psychiatry: Irritable mood, congruent affect, minimally interactive, and able to assess judgment or insight at this time ? ? ?Data Reviewed: ? ?Notable labs: BUN 35, creatinine 4.81, calcium 7.7, magnesium 1.6, troponin 313 flat.  Lipid panel is within normal limits.  CBC with hemoglobin 9.7 otherwise within normal limits ? ?Family Communication: None ? ?Disposition: ?Status is: Inpatient ?Remains inpatient appropriate because: Severity Faille as outlined above, BP uncontrolled, ongoing evaluation.  Discharge pending clearance by nephrology and cardiology. ? Planned Discharge Destination: Home ? ? ? ?Time spent: 35 minutes ? ?Author: ?Ezekiel Slocumb, DO ?09/24/2021 5:18 PM ? ?For on call review [email or web address].  ?

## 2021-09-24 NOTE — Progress Notes (Signed)
Arbutus Ped notified of telemetry order expired.  Verbal order given to discontinue. ?

## 2021-09-24 NOTE — Progress Notes (Signed)
EKG machine malfunctioned - unable to print or save. Repeat will be done - patient considerably soiled and needs cleaning first. Nurse tech is only one on the floor tonight, and 3 new patients just arrived to the floor along with this patient. ?

## 2021-09-24 NOTE — Assessment & Plan Note (Addendum)
?   Continue amlodipine given cocaine abuse. ?? Volume removal by dialysis ?

## 2021-09-24 NOTE — Progress Notes (Signed)
Cross Cover ?Critical troponin reported from nurse. Insignificant increase from prio but worsening over last 3 days liikely due to fluid overload. Minimal fluid removed with HD today secondary to hgh venous pressures  EKG without significant ST T wave abnormalities. Worsening of QT prolongation. Continue to monitor on tele and f/u with nephrology servies for repeat HD today ?
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
## 2021-09-25 DIAGNOSIS — I10 Essential (primary) hypertension: Secondary | ICD-10-CM

## 2021-09-25 DIAGNOSIS — R0602 Shortness of breath: Secondary | ICD-10-CM | POA: Diagnosis not present

## 2021-09-25 LAB — BASIC METABOLIC PANEL
Anion gap: 12 (ref 5–15)
BUN: 54 mg/dL — ABNORMAL HIGH (ref 6–20)
CO2: 25 mmol/L (ref 22–32)
Calcium: 7.5 mg/dL — ABNORMAL LOW (ref 8.9–10.3)
Chloride: 104 mmol/L (ref 98–111)
Creatinine, Ser: 7.41 mg/dL — ABNORMAL HIGH (ref 0.61–1.24)
GFR, Estimated: 8 mL/min — ABNORMAL LOW (ref >60)
Glucose, Bld: 103 mg/dL — ABNORMAL HIGH (ref 70–99)
Potassium: 3.8 mmol/L (ref 3.5–5.1)
Sodium: 141 mmol/L (ref 135–145)

## 2021-09-25 LAB — HEPATITIS B SURFACE ANTIBODY, QUANTITATIVE: Hep B S AB Quant (Post): <3.1 m[IU]/mL — ABNORMAL LOW (ref >9.9)

## 2021-09-25 LAB — MAGNESIUM: Magnesium: 2 mg/dL (ref 1.7–2.4)

## 2021-09-25 NOTE — Progress Notes (Signed)
?Progress Note ? ? ?Patient: Larry Morrison NTZ:[id number] DOB: [birth year] DOA: 09/23/2021     2 ?DOS: the patient was seen and examined on 09/25/2021 ?  ?Brief hospital course: ?Larry Morrison is a 51 y.o. male with medical history significant of ESRD-HD (MWF), hypertension, stroke, cocaine abuse, tobacco abuse, pulmonary hypertension, CHF with EF of<20%, anemia, cardiac arrest, who presented to the ED on 09/23/2021 with progressively worsening with shortness of breath over several days.  He had presented for outpatient vascular procedure that was canceled due to his shortness of breath.  He was seen in ED on 5/3 due to shortness of breath, and was thought to be due to missing 2 of his dialysis.  Patient was dialyzed the day before presenting. ?  ?Of note, patient has previously had resection of the right brachial axillary AV graft.  He currently has no permanent extremity dialysis access.  He is maintained via PermCath which has been working but his dialysis runs have not been ideal. Patient is scheduled for creation of a left brachial axillary AV graft by Dr. Delana Meyer of vascular surgery, but this was canceled due to shortness of breath. ?  ?ED Course: WBC 6.6, BNP> 4500, UDS positive for cocaine, temperature normal, blood pressure 191/80, heart rate 94, RR 16, oxygen saturation 91-100% on room air.  X-ray showed stable bilateral pleural effusions, left greater than right, with associated atelectasis. ? ? ? ?Assessment and Plan: ?* SOB (shortness of breath) ?Etiology is not clear, likely multifactorial etiology, including ESRD, sCHF, bilateral pleural effusion.  Patient has elevated troponin level 157 --> 268, but no chest pain, which is likely due to demand ischemia.  Dr. Holley Raring of renal was consulted for dialysis ? ?-Bronchodilators ?-Dialysis per renal ? ?ESRD on hemodialysis ([place]) ?Nephrology following for dialysis. ?Vascular surgery planning to create new AV graft once patient is clinically improved ? ?Chronic combined  systolic and diastolic CHF (congestive heart failure) ([place]) ?Due to dilated cardiomyopathy.  2D echo 04/12/2021 showed EF<20, BNP > 4500.  Patient may have mild fluid overload. ?-Volume management per renal by dialysis ?-Cardiology following ?-No plans for ischemic evaluation, patient has reportedly declined this ?-Absolute cessation from cocaine and illicit drugs ? ?Stroke [facility]) ?- Start aspirin and Lipitor ? ?Cocaine use disorder ([place]) ?Cocaine positive on UDS in the ED. ?Amlodipine for hypertension ? ? ?Malnutrition of moderate degree ?- Start Ensure ? ?Elevated troponin ?Troponin level 157 --> 268.   ?No chest pain.   ?Likely due to demand ischemia and poor clearance in the setting of ESRD.  -Cardiology consulted, follow-up recommendations ?-Started on aspirin and Lipitor ?-Trend troponin ? ? ?Anemia in ESRD (end-stage renal disease) ([place]) ?Hemoglobin 10.8.  11.3 on 09/21/2021 ?-Follow-up by CBC ? ?Essential hypertension ?Continue amlodipine given cocaine abuse. ?Volume removal by dialysis ? ?Tobacco use disorder ?- Nicotine patch ? ? ? ? ?  ? ?Subjective: Patient awake sitting up in bed when seen on rounds today.  He reports overall feeling okay.  Denies any acute complaints including uncontrolled pain, fevers chills, nausea vomiting headaches.  No acute events reported.  He says procedure for his graft is planned for tomorrow. ? ?Physical Exam: ?Vitals:  ? 09/24/21 2307 09/25/21 0505 09/25/21 6759 09/25/21 0755  ?BP: 130/70  (!) 154/98 (!) 146/81  ?Pulse: 92  97 94  ?Resp: 20  20 16   ?Temp: 98.2 ?F (36.8 ?C)  99.1 ?F (37.3 ?C) 98.3 ?F (36.8 ?C)  ?TempSrc: Oral  Oral Oral  ?SpO2: 100%  99% 93%  ?  Weight:  47 kg    ?Height:      ? ?General exam: awake, alert, no acute distress, frail and chronically ill-appearing ?HEENT: Hearing grossly normal, and in ?Respiratory system: Normal respiratory effort at rest, on room air. ?Cardiovascular system: Regular rate and rhythm, no peripheral edema ?Gastrointestinal system:  Soft nondistended abdomen ?Central nervous system: Grossly nonfocal exam, normal speech ?Extremities: moves all, no edema, normal tone ?Psychiatry: Irritable mood, congruent affect ? ? ?Data Reviewed: ? ?Notable labs: Glucose 103, BUN 54, creatinine 7.41, calcium 7.5, GFR 8 ? ?Family Communication: None ? ?Disposition: ?Status is: Inpatient ?Remains inpatient appropriate because: Severity of illness as outlined above, BP uncontrolled, ongoing evaluation.  Discharge pending clearance by nephrology and cardiology. ? Planned Discharge Destination: Home ? ? ? ?Time spent: 35 minutes ? ?Author: ?Ezekiel Slocumb, DO ?09/25/2021 1:59 PM ? ?For on call review [email or web address].  ?

## 2021-09-25 NOTE — Progress Notes (Signed)
? ?Progress Note ? ?Patient Name: Larry Morrison ?Date of Encounter: 09/25/2021 ? ?[facility] Cardiologist: Kate Sable, MD  ? ?Subjective  ? ?No complaints this morning ?Denies significant shortness of breath, laying supine, comfortable ? ?Inpatient Medications  ?  ?Scheduled Meds: ? amLODipine  5 mg Oral QPM  ? aspirin EC  81 mg Oral Daily  ? atorvastatin  40 mg Oral Daily  ? calcium acetate  1,334 mg Oral TID WC  ? Chlorhexidine Gluconate Cloth  6 each Topical Daily  ? [START ON 09/26/2021] epoetin (EPOGEN/PROCRIT) injection  4,000 Units Intravenous Q M,W,F-HD  ? famotidine  20 mg Oral Once  ? feeding supplement  237 mL Oral BID BM  ? heparin  5,000 Units Subcutaneous Q8H  ? irbesartan  150 mg Oral QPM  ? nicotine  21 mg Transdermal Q24H  ? ?Continuous Infusions: ? ?PRN Meds: ?acetaminophen, albuterol, dextromethorphan-guaiFENesin, hydrALAZINE, ipratropium-albuterol  ? ?Vital Signs  ?  ?Vitals:  ? 09/24/21 2307 09/25/21 0505 09/25/21 8563 09/25/21 0755  ?BP: 130/70  (!) 154/98 (!) 146/81  ?Pulse: 92  97 94  ?Resp: 20  20 16   ?Temp: 98.2 ?F (36.8 ?C)  99.1 ?F (37.3 ?C) 98.3 ?F (36.8 ?C)  ?TempSrc: Oral  Oral Oral  ?SpO2: 100%  99% 93%  ?Weight:  47 kg    ?Height:      ? ? ?Intake/Output Summary (Last 24 hours) at 09/25/2021 1205 ?Last data filed at 09/25/2021 0900 ?Gross per 24 hour  ?Intake 511.5 ml  ?Output 375 ml  ?Net 136.5 ml  ? ? ?  09/25/2021  ?  5:05 AM 09/23/2021  ? 11:00 PM 09/23/2021  ?  8:53 AM  ?Last 3 Weights  ?Weight (lbs) 103 lb 9.9 oz 108 lb 0.4 oz 110 lb 3.7 oz  ?Weight (kg) 47 kg 49 kg 50 kg  ?   ? ?Telemetry  ?  ?Normal sinus rhythm- Personally Reviewed ? ?ECG  ?  ? - Personally Reviewed ? ?Physical Exam  ? ?GEN: No acute distress.  Very thin ?Neck: No JVD ?Cardiac: RRR, no murmurs, rubs, or gallops.  ?Respiratory: Clear to auscultation bilaterally. ?GI: Soft, nontender, non-distended  ?MS: No edema; No deformity.  Muscle atrophy ?Neuro:  Nonfocal  ?Psych: Normal affect  ? ?Labs  ?  ?High  Sensitivity Troponin:   ?Recent Labs  ?Lab 09/21/21 ?1153 09/21/21 ?1353 09/23/21 ?1430 09/23/21 ?2105 09/24/21 ?1497  ?TROPONINIHS 157* 157* 268* 307* 313*  ?   ?Chemistry ?Recent Labs  ?Lab 09/21/21 ?1153 09/23/21 ?0263 09/24/21 ?7858 09/25/21 ?0425  ?NA 139 142 138 141  ?K 4.8 4.1 3.5 3.8  ?CL 102 107 99 104  ?CO2 18*  --  28 25  ?GLUCOSE 81 109* 85 103*  ?BUN 82* 59* 35* 54*  ?CREATININE 10.25* 9.60* 4.81* 7.41*  ?CALCIUM 8.1*  --  7.7* 7.5*  ?MG  --   --  1.6* 2.0  ?PROT 9.1*  --   --   --   ?ALBUMIN 3.1*  --   --   --   ?AST 28  --   --   --   ?ALT 34  --   --   --   ?ALKPHOS 182*  --   --   --   ?BILITOT 1.0  --   --   --   ?GFRNONAA 6*  --  14* 8*  ?ANIONGAP 19*  --  11 12  ?  ?Lipids  ?Recent Labs  ?Lab 09/24/21 ?8502  ?  CHOL 99  ?TRIG 67  ?HDL 44  ?[place] 42  ?CHOLHDL 2.3  ?  ?Hematology ?Recent Labs  ?Lab 09/21/21 ?1153 09/23/21 ?1610 09/23/21 ?9604 09/24/21 ?5409  ?WBC 5.5  --  6.6 5.4  ?RBC 3.81*  --  3.65* 3.30*  ?HGB 11.3* 13.3 10.8* 9.7*  ?HCT 36.2* 39.0 34.9* 31.4*  ?MCV 95.0  --  95.6 95.2  ?MCH 29.7  --  29.6 29.4  ?MCHC 31.2  --  30.9 30.9  ?RDW 16.5*  --  16.2* 16.4*  ?PLT 239  --  208 177  ? ?Thyroid No results for input(s): TSH, FREET4 in the last 168 hours.  ?BNP ?Recent Labs  ?Lab 09/21/21 ?1153 09/23/21 ?0951  ?BNP >4,500.0* >4,500.0*  ?  ?DDimer No results for input(s): DDIMER in the last 168 hours.  ? ?Radiology  ?  ?No results found. ? ?Cardiac Studies  ? ?Echocardiogram November 2022 ? 1. Left ventricular ejection fraction, by estimation, is <20%. The left  ?ventricle has severely decreased function. The left ventricle demonstrates  ?global hypokinesis. Left ventricular diastolic parameters are consistent  ?with Grade II diastolic  ?dysfunction (pseudonormalization). Elevated left atrial pressure.  ? 2. There is at least mild pulmonary hypertension (RVSP 35-40 mmHg plus  ?central venous pressure). Right ventricular systolic function is  ?moderately reduced. The right ventricular size is  normal.  ? 3. Left atrial size was severely dilated.  ? 4. The mitral valve is abnormal. Mild to moderate mitral valve  ?regurgitation. No evidence of mitral stenosis.  ? 5. Tricuspid valve regurgitation is moderate to severe.  ? 6. The aortic valve is tricuspid. Aortic valve regurgitation is not  ?visualized. No aortic stenosis is present.  ? ?Patient Profile  ?   ?Larry Morrison is a 51 year old gentleman with history of cardiomyopathy ejection fraction less than 81%, chronic systolic CHF, end-stage renal disease on hemodialysis, polysubstance abuse, nephrostomy tube with recurrent UTIs, severe debility, anorexia presenting for inpatient evaluation with shortness of breath ? ?Assessment & Plan  ?  ?Preop cardiovascular evaluation ?Although low risk procedure AV graft placement, he is a high risk individual given multiple medical conditions including dilated cardiomyopathy, end-stage renal disease on hemodialysis, periodic cocaine use, severe protein calorie malnutrition ?-No further cardiac work-up indicated at this time prior to procedure ?-Hemodialysis for hypervolemia /shortness of breath per nephrology ? ?Pulmonary edema in the setting of end-stage renal failure on hemodialysis ?Known dilated cardiomyopathy, has previously declined work-up including catheterization ?History of noncompliance with hemodialysis, frequent polysubstance abuse/positive for cocaine numerous times over the past year ?-Currently no plans for ischemic work-up.  ?-Fluid status managed by dialysis.  By report missed 2 sessions this past week leading to increased shortness of breath,  ?--AV graft placement postponed until tomorrow ?Plan for dialysis Monday or Tuesday after surgery ?  ?Elevated troponin/demand ischemia ?In the setting of malignant hypertension, volume overload, cardiomyopathy, cocaine use ?Cocaine cessation recommended ?Amlodipine and irbesartan for blood pressure, improved ?Hemodialysis for hypervolemia ?  ?End-stage  renal disease ?Intermittent noncompliance, missed 2 days this past week leading to increased shortness of breath ?Followed by nephrology ? ?Severe protein calorie malnutrition ?May benefit from meeting with nutritionist ?Severe muscle atrophy noted ? ? ? Total encounter time more than 35 minutes ? Greater than 50% was spent in counseling and coordination of care with the patient ? ? ? ?For questions or updates, please contact [place] ?Please consult [email or web address] for contact info under  ? ?  ?   ?Signed, ?  Ida Rogue, MD  ?09/25/2021, 12:05 PM    ?

## 2021-09-25 NOTE — Progress Notes (Signed)
?[facility]  ?ROUNDING NOTE  ? ?Subjective:  ? ?Larry Morrison is a 51 year old African-American male with past medical history including pulmonary hypertension, combined CHF, CVA, hypertension, neurogenic bladder with right nephrostomy tube, and end-stage renal disease on hemodialysis.   ? ?Patient presented to vascular surgery for placement of an AV Gore-Tex graft for dialysis use.  Treatment team concerned about frail appearance of patient intolerance and scheduled procedure.  Patient will be admitted for AV graft malfunction ([place]) [O67.124P] ?SOB (shortness of breath) [R06.02] ? ?Patient is known to our practice and receives outpatient dialysis treatments at [facility] on a MWF schedule,   Patient recently presented to the emergency department with complaints of shortness of breath after missing 2 dialysis treatments.    ? ?Denies any acute complaints this morning ?No shortness of breath ? ? ? ? ?Objective:  ?Vital signs in last 24 hours:  ?Temp:  [98.2 ?F (36.8 ?C)-99.1 ?F (37.3 ?C)] 98.3 ?F (36.8 ?C) (05/07 0755) ?Pulse Rate:  [92-98] 94 (05/07 0755) ?Resp:  [16-20] 16 (05/07 0755) ?BP: (130-155)/(70-98) 146/81 (05/07 0755) ?SpO2:  [93 %-100 %] 93 % (05/07 0755) ?Weight:  [47 kg] 47 kg (05/07 0505) ? ?Weight change: -3 kg ?Filed Weights  ? 09/23/21 0853 09/23/21 2300 09/25/21 0505  ?Weight: 50 kg 49 kg 47 kg  ? ? ?Intake/Output: ?I/O last 3 completed shifts: ?In: 451.5 [P.O.:420; IV Piggyback:31.5] ?Out: 798 [Urine:200; Other:598] ?  ?Intake/Output this shift: ? Total I/O ?In: 360 [P.O.:360] ?Out: 175 [Urine:175] ? ?Physical Exam: ?General: NAD, resting quietly  ?Head: Normocephalic, atraumatic. Moist oral mucosal membranes  ?Eyes: Anicteric  ?Lungs:  Clear to auscultation, normal effort on room air  ?Heart: Regular rate and rhythm  ?Abdomen:  Soft, nontender, nondistended  ?Extremities: No peripheral edema.  ?Neurologic: Alert, moving all four extremities  ?Skin: No lesions  ?Access: Right  IJ PermCath  ?Right nephrostomy tube. ? ?Basic Metabolic Panel: ?Recent Labs  ?Lab 09/21/21 ?1153 09/23/21 ?8099 09/24/21 ?8338 09/25/21 ?0425  ?NA 139 142 138 141  ?K 4.8 4.1 3.5 3.8  ?CL 102 107 99 104  ?CO2 18*  --  28 25  ?GLUCOSE 81 109* 85 103*  ?BUN 82* 59* 35* 54*  ?CREATININE 10.25* 9.60* 4.81* 7.41*  ?CALCIUM 8.1*  --  7.7* 7.5*  ?MG  --   --  1.6* 2.0  ?PHOS 11.1*  --   --   --   ? ? ? ?Liver Function Tests: ?Recent Labs  ?Lab 09/21/21 ?1153  ?AST 28  ?ALT 34  ?ALKPHOS 182*  ?BILITOT 1.0  ?PROT 9.1*  ?ALBUMIN 3.1*  ? ? ?No results for input(s): LIPASE, AMYLASE in the last 168 hours. ?No results for input(s): AMMONIA in the last 168 hours. ? ?CBC: ?Recent Labs  ?Lab 09/21/21 ?1153 09/23/21 ?2505 09/23/21 ?3976 09/24/21 ?7341  ?WBC 5.5  --  6.6 5.4  ?NEUTROABS 3.5  --   --   --   ?HGB 11.3* 13.3 10.8* 9.7*  ?HCT 36.2* 39.0 34.9* 31.4*  ?MCV 95.0  --  95.6 95.2  ?PLT 239  --  208 177  ? ? ? ?Cardiac Enzymes: ?No results for input(s): CKTOTAL, CKMB, CKMBINDEX, TROPONINI in the last 168 hours. ? ?BNP: ?Invalid input(s): POCBNP ? ?CBG: ?Recent Labs  ?Lab 09/23/21 ?0810  ?GLUCAP 76  ? ? ? ?Microbiology: ?Results for orders placed or performed during the hospital encounter of 09/23/21  ?Resp Panel by RT-PCR (Flu A&B, Covid) Nasopharyngeal Swab  Status: None  ? Collection Time: 09/23/21  4:21 PM  ? Specimen: Nasopharyngeal Swab; Nasopharyngeal(NP) swabs in vial transport medium  ?Result Value Ref Range Status  ? SARS Coronavirus 2 by RT PCR NEGATIVE NEGATIVE Final  ?  Comment: (NOTE) ?SARS-CoV-2 target nucleic acids are NOT DETECTED. ? ?The SARS-CoV-2 RNA is generally detectable in upper respiratory ?specimens during the acute phase of infection. The lowest ?concentration of SARS-CoV-2 viral copies this assay can detect is ?138 copies/mL. A negative result does not preclude SARS-Cov-2 ?infection and should not be used as the sole basis for treatment or ?other patient management decisions. A negative result may  occur with  ?improper specimen collection/handling, submission of specimen other ?than nasopharyngeal swab, presence of viral mutation(s) within the ?areas targeted by this assay, and inadequate number of viral ?copies(<138 copies/mL). A negative result must be combined with ?clinical observations, patient history, and epidemiological ?information. The expected result is Negative. ? ?Fact Sheet for Patients:  ?EntrepreneurPulse.com.au ? ?Fact Sheet for Healthcare Providers:  ?IncredibleEmployment.be ? ?This test is no t yet approved or cleared by the [place] FDA and  ?has been authorized for detection and/or diagnosis of SARS-CoV-2 by ?FDA under an Emergency Use Authorization (EUA). This EUA will remain  ?in effect (meaning this test can be used) for the duration of the ?COVID-19 declaration under Section 564(b)(1) of the Act, 21 ?U.S.C.section 360bbb-3(b)(1), unless the authorization is terminated  ?or revoked sooner.  ? ? ?  ? Influenza A by PCR NEGATIVE NEGATIVE Final  ? Influenza B by PCR NEGATIVE NEGATIVE Final  ?  Comment: (NOTE) ?The Xpert Xpress SARS-CoV-2/FLU/RSV plus assay is intended as an aid ?in the diagnosis of influenza from Nasopharyngeal swab specimens and ?should not be used as a sole basis for treatment. Nasal washings and ?aspirates are unacceptable for Xpert Xpress SARS-CoV-2/FLU/RSV ?testing. ? ?Fact Sheet for Patients: ?EntrepreneurPulse.com.au ? ?Fact Sheet for Healthcare Providers: ?IncredibleEmployment.be ? ?This test is not yet approved or cleared by the [place] FDA and ?has been authorized for detection and/or diagnosis of SARS-CoV-2 by ?FDA under an Emergency Use Authorization (EUA). This EUA will remain ?in effect (meaning this test can be used) for the duration of the ?COVID-19 declaration under Section 564(b)(1) of the Act, 21 U.S.C. ?section 360bbb-3(b)(1), unless the authorization is terminated  or ?revoked. ? ?Performed at [facility], [place], ?[place] [address] ?  ? ? ?Coagulation Studies: ?No results for input(s): LABPROT, INR in the last 72 hours. ? ?Urinalysis: ?No results for input(s): COLORURINE, LABSPEC, [place], GLUCOSEU, HGBUR, BILIRUBINUR, KETONESUR, PROTEINUR, UROBILINOGEN, NITRITE, LEUKOCYTESUR in the last 72 hours. ? ?Invalid input(s): APPERANCEUR ?  ? ? ?Imaging: ?No results found. ? ? ?Medications:  ? ? ? ? ? amLODipine  5 mg Oral QPM  ? aspirin EC  81 mg Oral Daily  ? atorvastatin  40 mg Oral Daily  ? calcium acetate  1,334 mg Oral TID WC  ? Chlorhexidine Gluconate Cloth  6 each Topical Daily  ? [START ON 09/26/2021] epoetin (EPOGEN/PROCRIT) injection  4,000 Units Intravenous Q M,W,F-HD  ? famotidine  20 mg Oral Once  ? feeding supplement  237 mL Oral BID BM  ? heparin  5,000 Units Subcutaneous Q8H  ? irbesartan  150 mg Oral QPM  ? nicotine  21 mg Transdermal Q24H  ? ? ? ?Assessment/ Plan:  ?Mr. Vedansh Kerstetter is a 51 y.o.  male with past medical history including pulmonary hypertension, combined CHF, CVA, hypertension, neurogenic bladder with  right nephrostomy tube, and end-stage renal disease on hemodialysis.   ? ?[facility]/MWF/Rt Permcath ? ?End stage renal disease requiring hemodialysis.  AV graft placement postponed till Monday.  Patient underwent dialysis treatment on Friday evening with 600 cc fluid removed.  Next dialysis treatment scheduled for Monday or Tuesday after surgery. ? ?2. Anemia of chronic kidney disease ?Macrocytic ?Lab Results  ?Component Value Date  ? HGB 9.7 (L) 09/24/2021  ?  ?Recieves mircera as outpt.   ?Epogen with hemodialysis treatments ? ?3. Secondary Hyperparathyroidism:  ?Lab Results  ?Component Value Date  ? CALCIUM 7.5 (L) 09/25/2021  ? CAION 0.79 (LL) 09/23/2021  ? PHOS 11.1 (H) 09/21/2021  ?  ?Calcium and phosphorus not within acceptable range.  We will continue to monitor.  Should correct some with dialysis and  improved nutrition. ?Start calcium acetate with meals ? ?4. Hypertension with chronic kidney disease. Home regimen includes carvedilol and irbesartan.   Blood pressure better controlled after adding amlodipine to h

## 2021-09-26 ENCOUNTER — Inpatient Hospital Stay: Payer: Medicaid Other | Admitting: Anesthesiology

## 2021-09-26 DIAGNOSIS — R0602 Shortness of breath: Secondary | ICD-10-CM | POA: Diagnosis not present

## 2021-09-26 MED ORDER — RENA-VITE PO TABS
1.0000 | ORAL_TABLET | Freq: Every day | ORAL | Status: DC
  Administered 2021-09-26 – 2021-09-28 (×3): 1 via ORAL
  Filled 2021-09-26 (×4): qty 1

## 2021-09-26 MED ORDER — EPOETIN ALFA 4000 UNIT/ML IJ SOLN
INTRAMUSCULAR | Status: AC
  Filled 2021-09-26: qty 1

## 2021-09-26 NOTE — Progress Notes (Signed)
?Progress Note ? ? ?Patient: Larry Morrison ZHG:[id number] DOB: May 11, 1971 DOA: 09/23/2021     3 ?DOS: the patient was seen and examined on 09/26/2021 ?  ?Brief hospital course: ?Maahir Horst is a 51 y.o. male with medical history significant of ESRD-HD (MWF), hypertension, stroke, cocaine abuse, tobacco abuse, pulmonary hypertension, CHF with EF of<20%, anemia, cardiac arrest, who presented to the ED on 09/23/2021 with progressively worsening with shortness of breath over several days.  He had presented for outpatient vascular procedure that was canceled due to his shortness of breath.  He was seen in ED on 5/3 due to shortness of breath, and was thought to be due to missing 2 of his dialysis.  Patient was dialyzed the day before presenting. ?  ?Of note, patient has previously had resection of the right brachial axillary AV graft.  He currently has no permanent extremity dialysis access.  He is maintained via PermCath which has been working but his dialysis runs have not been ideal. Patient is scheduled for creation of a left brachial axillary AV graft by Dr. Delana Meyer of vascular surgery, but this was canceled due to shortness of breath. ?  ?ED Course: WBC 6.6, BNP> 4500, UDS positive for cocaine, temperature normal, blood pressure 191/80, heart rate 94, RR 16, oxygen saturation 91-100% on room air.  X-ray showed stable bilateral pleural effusions, left greater than right, with associated atelectasis. ? ? ? ?Assessment and Plan: ?* SOB (shortness of breath) ?Etiology is not clear, likely multifactorial etiology, including ESRD, sCHF, bilateral pleural effusion.  Patient has elevated troponin level 157 --> 268, but no chest pain, which is likely due to demand ischemia.  Dr. Holley Raring of renal was consulted for dialysis ? ?-Bronchodilators ?-Dialysis per renal ? ?ESRD on hemodialysis ([place]) ?Nephrology following for dialysis. ?Vascular surgery planning to create new AV graft once patient is clinically improved ? ?Chronic combined  systolic and diastolic CHF (congestive heart failure) ([place]) ?Due to dilated cardiomyopathy.  2D echo 04/12/2021 showed EF<20, BNP > 4500.  Patient may have mild fluid overload. ?-Volume management per renal by dialysis ?-Cardiology following ?-No plans for ischemic evaluation, patient has reportedly declined this ?-Absolute cessation from cocaine and illicit drugs ? ?Stroke [facility]) ?- Start aspirin and Lipitor ? ?Cocaine use disorder ([place]) ?Cocaine positive on UDS in the ED. ?Amlodipine for hypertension ? ? ?Malnutrition of moderate degree ?- Start Ensure ? ?Elevated troponin ?Troponin level 157 --> 268.   ?No chest pain.   ?Likely due to demand ischemia and poor clearance in the setting of ESRD.  -Cardiology consulted, follow-up recommendations ?-Started on aspirin and Lipitor ?-Trend troponin ? ? ?Anemia in ESRD (end-stage renal disease) ([place]) ?Hemoglobin 10.8.  11.3 on 09/21/2021 ?-Follow-up by CBC ? ?Essential hypertension ?Continue amlodipine given cocaine abuse. ?Volume removal by dialysis ? ?Tobacco use disorder ?- Nicotine patch ? ? ? ? ?  ? ?Subjective: Patient seen during dialysis this morning.  He reports feeling okay today denies fevers chills, feeling sick, uncontrolled pain or other complaints at this time.  No acute rash reported. ? ?Physical Exam: ?Vitals:  ? 09/26/21 1253 09/26/21 1300 09/26/21 1400 09/26/21 1521  ?BP: (!) 157/96 (!) 140/91  134/79  ?Pulse: 79 79  91  ?Resp: 20 (!) 37 18 18  ?Temp: 97.8 ?F (36.6 ?C)   98.4 ?F (36.9 ?C)  ?TempSrc: Oral   Oral  ?SpO2: 100% 100% 99% 90%  ?Weight: 45.6 kg     ?Height:      ? ?General exam: Awake  sitting up in bed, dialysis underway, frail and chronically ill-appearing ?HEENT: Moist mucous membranes, hearing grossly normal ?Respiratory system: Lungs clear, normal respiratory effort on room air. ?Cardiovascular system: Regular rate and rhythm, no peripheral edema ?Central nervous system: A&O x3, grossly nonfocal exam, normal speech ?Extremities: moves all, no  edema, normal tone ?Psychiatry: Normal mood, congruent affect ? ? ?Data Reviewed: ? ?No new labs to review at this time ? ?Family Communication: None ? ?Disposition: ?Status is: Inpatient ?Remains inpatient appropriate because: Severity of illness as outlined above, procedure planned.  Discharge pending clearance by vascular, nephrology.   ?DSS involved petitioning for guardianship. ? ? Planned Discharge Destination: Home ? ? ? ?Time spent: 35 minutes ? ?Author: ?Ezekiel Slocumb, DO ?09/26/2021 4:27 PM ? ?For on call review [email or web address].  ?

## 2021-09-26 NOTE — Progress Notes (Signed)
Pt refused his heparin injection, the risks and benefits explained to pt and he verbalized understanding.  ?

## 2021-09-26 NOTE — Progress Notes (Signed)
[facility]  ROUNDING NOTE   Subjective:   Larry Morrison is a 51 year old African-American male with past medical history including pulmonary hypertension, combined CHF, CVA, hypertension, neurogenic bladder with right nephrostomy tube, and end-stage renal disease on hemodialysis.    Patient presented to vascular surgery for placement of an AV Gore-Tex graft for dialysis use.  Treatment team concerned about frail appearance of patient intolerance and scheduled procedure.  Patient will be admitted for AV graft malfunction (HCC) [T82.590A] SOB (shortness of breath) [R06.02]  Patient is known to our practice and receives outpatient dialysis treatments at [facility] on a MWF schedule,   Patient recently presented to the emergency department with complaints of shortness of breath after missing 2 dialysis treatments.     Patient was seen today on dialysis Patient was tolerating treatment well    Objective:  Vital signs in last 24 hours:  Temp:  [97.8 F (36.6 C)-98.9 F (37.2 C)] 98.4 F (36.9 C) (05/08 1521) Pulse Rate:  [79-97] 91 (05/08 1521) Resp:  [18-43] 18 (05/08 1521) BP: (121-161)/(77-109) 134/79 (05/08 1521) SpO2:  [90 %-100 %] 90 % (05/08 1521) Weight:  [45.6 kg] 45.6 kg (05/08 1253)  Weight change:  Filed Weights   09/23/21 2300 09/25/21 0505 09/26/21 1253  Weight: 49 kg 47 kg 45.6 kg    Intake/Output: I/O last 3 completed shifts: In: 600 [P.O.:600] Out: 425 [Urine:425]   Intake/Output this shift:  Total I/O In: 0  Out: 1000 [Other:1000]  Physical Exam: General: NAD, resting quietly  Head: Normocephalic, atraumatic. Moist oral mucosal membranes  Eyes: Anicteric  Lungs:  Clear to auscultation, normal effort on room air  Heart: Regular rate and rhythm  Abdomen:  Soft, nontender, nondistended  Extremities: No peripheral edema.  Neurologic: Alert, moving all four extremities  Skin: No lesions  Access: Right IJ PermCath  Right nephrostomy  tube.  Basic Metabolic Panel: Recent Labs  Lab 09/21/21 1153 09/23/21 0834 09/24/21 0043 09/25/21 0425  NA 139 142 138 141  K 4.8 4.1 3.5 3.8  CL 102 107 99 104  CO2 18*  --  28 25  GLUCOSE 81 109* 85 103*  BUN 82* 59* 35* 54*  CREATININE 10.25* 9.60* 4.81* 7.41*  CALCIUM 8.1*  --  7.7* 7.5*  MG  --   --  1.6* 2.0  PHOS 11.1*  --   --   --     Liver Function Tests: Recent Labs  Lab 09/21/21 1153  AST 28  ALT 34  ALKPHOS 182*  BILITOT 1.0  PROT 9.1*  ALBUMIN 3.1*   No results for input(s): LIPASE, AMYLASE in the last 168 hours. No results for input(s): AMMONIA in the last 168 hours.  CBC: Recent Labs  Lab 09/21/21 1153 09/23/21 0834 09/23/21 0951 09/24/21 0043  WBC 5.5  --  6.6 5.4  NEUTROABS 3.5  --   --   --   HGB 11.3* 13.3 10.8* 9.7*  HCT 36.2* 39.0 34.9* 31.4*  MCV 95.0  --  95.6 95.2  PLT 239  --  208 177    Cardiac Enzymes: No results for input(s): CKTOTAL, CKMB, CKMBINDEX, TROPONINI in the last 168 hours.  BNP: Invalid input(s): POCBNP  CBG: Recent Labs  Lab 09/23/21 0810  GLUCAP 76    Microbiology: Results for orders placed or performed during the hospital encounter of 09/23/21  Resp Panel by RT-PCR (Flu A&B, Covid) Nasopharyngeal Swab     Status: None   Collection Time: 09/23/21  4:21 PM   Specimen: Nasopharyngeal Swab; Nasopharyngeal(NP) swabs in vial transport medium  Result Value Ref Range Status   SARS Coronavirus 2 by RT PCR NEGATIVE NEGATIVE Final    Comment: (NOTE) SARS-CoV-2 target nucleic acids are NOT DETECTED.  The SARS-CoV-2 RNA is generally detectable in upper respiratory specimens during the acute phase of infection. The lowest concentration of SARS-CoV-2 viral copies this assay can detect is 138 copies/mL. A negative result does not preclude SARS-Cov-2 infection and should not be used as the sole basis for treatment or other patient management decisions. A negative result may occur with  improper specimen  collection/handling, submission of specimen other than nasopharyngeal swab, presence of viral mutation(s) within the areas targeted by this assay, and inadequate number of viral copies(<138 copies/mL). A negative result must be combined with clinical observations, patient history, and epidemiological information. The expected result is Negative.  Fact Sheet for Patients:  BloggerCourse.com  Fact Sheet for Healthcare Providers:  SeriousBroker.it  This test is no t yet approved or cleared by the [place] FDA and  has been authorized for detection and/or diagnosis of SARS-CoV-2 by FDA under an Emergency Use Authorization (EUA). This EUA will remain  in effect (meaning this test can be used) for the duration of the COVID-19 declaration under Section 564(b)(1) of the Act, 21 U.S.C.section 360bbb-3(b)(1), unless the authorization is terminated  or revoked sooner.       Influenza A by PCR NEGATIVE NEGATIVE Final   Influenza B by PCR NEGATIVE NEGATIVE Final    Comment: (NOTE) The Xpert Xpress SARS-CoV-2/FLU/RSV plus assay is intended as an aid in the diagnosis of influenza from Nasopharyngeal swab specimens and should not be used as a sole basis for treatment. Nasal washings and aspirates are unacceptable for Xpert Xpress SARS-CoV-2/FLU/RSV testing.  Fact Sheet for Patients: BloggerCourse.com  Fact Sheet for Healthcare Providers: SeriousBroker.it  This test is not yet approved or cleared by the [place] FDA and has been authorized for detection and/or diagnosis of SARS-CoV-2 by FDA under an Emergency Use Authorization (EUA). This EUA will remain in effect (meaning this test can be used) for the duration of the COVID-19 declaration under Section 564(b)(1) of the Act, 21 U.S.C. section 360bbb-3(b)(1), unless the authorization is terminated or revoked.  Performed at [facility], [address] [place], [place] [address]     Coagulation Studies: No results for input(s): LABPROT, INR in the last 72 hours.  Urinalysis: No results for input(s): COLORURINE, LABSPEC, PHURINE, GLUCOSEU, HGBUR, BILIRUBINUR, KETONESUR, PROTEINUR, UROBILINOGEN, NITRITE, LEUKOCYTESUR in the last 72 hours.  Invalid input(s): APPERANCEUR     Imaging: No results found.   Medications:       amLODipine  5 mg Oral QPM   aspirin EC  81 mg Oral Daily   atorvastatin  40 mg Oral Daily   calcium acetate  1,334 mg Oral TID WC   Chlorhexidine Gluconate Cloth  6 each Topical Daily   epoetin (EPOGEN/PROCRIT) injection  4,000 Units Intravenous Q M,W,F-HD   famotidine  20 mg Oral Once   feeding supplement  237 mL Oral BID BM   heparin  5,000 Units Subcutaneous Q8H   irbesartan  150 mg Oral QPM   multivitamin  1 tablet Oral QHS   nicotine  21 mg Transdermal Q24H     Assessment/ Plan:  [name] is a 51 y.o.  male with past medical history including pulmonary hypertension, combined CHF, CVA, hypertension, neurogenic bladder with right nephrostomy tube,  and end-stage renal disease on hemodialysis.    [facility]/MWF/Rt Permcath  End stage renal disease requiring hemodialysis.  AV graft placement postponed till Monday.  Patient underwent dialysis treatment on Friday evening with 600 cc fluid removed.          Patient was dialyzed today        2. Anemia of chronic kidney disease Macrocytic Lab Results  Component Value Date   HGB 9.7 (L) 09/24/2021    Recieves mircera as outpt.   Epogen with hemodialysis treatments  3. Secondary Hyperparathyroidism:  Lab Results  Component Value Date   CALCIUM 7.5 (L) 09/25/2021   CAION 0.79 (LL) 09/23/2021   PHOS 11.1 (H) 09/21/2021    Calcium and phosphorus not within acceptable range.  We will continue to monitor.  Should correct some with dialysis and improved nutrition. On  calcium acetate with meals We  will follow up on phosphorus  4. Hypertension with chronic kidney disease. Home regimen includes carvedilol and irbesartan.   Blood pressure better controlled after adding amlodipine to his regimen.     LOS: 3 Hanan Mcwilliams s [facility] 5/8/20235:21 PM

## 2021-09-26 NOTE — Progress Notes (Signed)
Hemodialysis Post Treatment Note ? ?26 Sep 2021 ? ?Access: RIJ Catheter  ? ?Treatment Time: 3 Hrs. ? ?UF Removed: 1-liter ? ?Next Scheduled Treatment: 09/28/2021 ? ?Note: ?Patient completes scheduled 3-hour hemodialysis treatment. Right IJ catheter, intact, no signs of infection. Prescribed BFR maintained throughout the course of treatment. Goals of hemodialysis met, 1-liter fluid removed, dressing changed, and no adverse reaction noted. Patient offers up no concerns, returns to assigned room.   ?

## 2021-09-27 ENCOUNTER — Encounter: Payer: Self-pay | Admitting: Internal Medicine

## 2021-09-27 ENCOUNTER — Ambulatory Visit: Admission: RE | Admit: 2021-09-27 | Payer: Medicaid Other | Source: Home / Self Care | Admitting: Vascular Surgery

## 2021-09-27 ENCOUNTER — Inpatient Hospital Stay: Payer: Medicaid Other

## 2021-09-27 ENCOUNTER — Encounter
Admission: RE | Disposition: A | Discharge status: Home / Self Care | Payer: Self-pay | Source: Home / Self Care | Attending: Internal Medicine

## 2021-09-27 DIAGNOSIS — R0602 Shortness of breath: Secondary | ICD-10-CM | POA: Diagnosis not present

## 2021-09-27 LAB — CBC
HCT: 32.2 % — ABNORMAL LOW (ref 39.0–52.0)
Hemoglobin: 10 g/dL — ABNORMAL LOW (ref 13.0–17.0)
MCH: 29.5 pg (ref 26.0–34.0)
MCHC: 31.1 g/dL (ref 30.0–36.0)
MCV: 95 fL (ref 80.0–100.0)
Platelets: 175 10*3/uL (ref 150–400)
RBC: 3.39 MIL/uL — ABNORMAL LOW (ref 4.22–5.81)
RDW: 16.2 % — ABNORMAL HIGH (ref 11.5–15.5)
WBC: 6.9 10*3/uL (ref 4.0–10.5)
nRBC: 0 % (ref 0.0–0.2)

## 2021-09-27 LAB — RENAL FUNCTION PANEL
Albumin: 2.3 g/dL — ABNORMAL LOW (ref 3.5–5.0)
Anion gap: 12 (ref 5–15)
BUN: 32 mg/dL — ABNORMAL HIGH (ref 6–20)
CO2: 26 mmol/L (ref 22–32)
Calcium: 7.8 mg/dL — ABNORMAL LOW (ref 8.9–10.3)
Chloride: 101 mmol/L (ref 98–111)
Creatinine, Ser: 5.32 mg/dL — ABNORMAL HIGH (ref 0.61–1.24)
GFR, Estimated: 12 mL/min — ABNORMAL LOW (ref >60)
Glucose, Bld: 86 mg/dL (ref 70–99)
Phosphorus: 4.6 mg/dL (ref 2.5–4.6)
Potassium: 4 mmol/L (ref 3.5–5.1)
Sodium: 139 mmol/L (ref 135–145)

## 2021-09-27 LAB — URINE DRUG SCREEN, QUALITATIVE (ARMC ONLY)
Amphetamines, Ur Screen: NOT DETECTED
Barbiturates, Ur Screen: NOT DETECTED
Benzodiazepine, Ur Scrn: NOT DETECTED
Cannabinoid 50 Ng, Ur ~~LOC~~: NOT DETECTED
Cocaine Metabolite,Ur ~~LOC~~: POSITIVE — AB
MDMA (Ecstasy)Ur Screen: NOT DETECTED
Methadone Scn, Ur: NOT DETECTED
Opiate, Ur Screen: NOT DETECTED
Phencyclidine (PCP) Ur S: NOT DETECTED
Tricyclic, Ur Screen: NOT DETECTED

## 2021-09-27 LAB — TYPE AND SCREEN
ABO/RH(D): B POS
Antibody Screen: NEGATIVE

## 2021-09-27 SURGERY — INSERTION OF ARTERIOVENOUS (AV) GORE-TEX GRAFT ARM
Anesthesia: General | Laterality: Left

## 2021-09-27 MED ORDER — CEFAZOLIN SODIUM-DEXTROSE 2-4 GM/100ML-% IV SOLN
2.0000 g | INTRAVENOUS | Status: DC
  Filled 2021-09-27: qty 100

## 2021-09-27 MED ORDER — BUPIVACAINE-EPINEPHRINE (PF) 0.5% -1:200000 IJ SOLN
INTRAMUSCULAR | Status: AC
  Filled 2021-09-27: qty 30

## 2021-09-27 MED ORDER — HEPARIN SODIUM (PORCINE) 5000 UNIT/ML IJ SOLN
INTRAMUSCULAR | Status: AC
Start: 2021-09-27 — End: ?
  Filled 2021-09-27: qty 1

## 2021-09-27 MED ORDER — PROPOFOL 10 MG/ML IV BOLUS
INTRAVENOUS | Status: AC
  Filled 2021-09-27: qty 20

## 2021-09-27 MED ORDER — CHLORHEXIDINE GLUCONATE CLOTH 2 % EX PADS: 6.0000 | MEDICATED_PAD | Freq: Once | CUTANEOUS | Status: AC

## 2021-09-27 MED ORDER — BUPIVACAINE LIPOSOME 1.3 % IJ SUSP
INTRAMUSCULAR | Status: AC
Start: 2021-09-27 — End: ?
  Filled 2021-09-27: qty 20

## 2021-09-27 MED ORDER — CHLORHEXIDINE GLUCONATE CLOTH 2 % EX PADS
6.0000 | MEDICATED_PAD | Freq: Once | CUTANEOUS | Status: AC
  Administered 2021-09-27: 6 via TOPICAL

## 2021-09-27 MED ORDER — FENTANYL CITRATE (PF) 100 MCG/2ML IJ SOLN
INTRAMUSCULAR | Status: AC
Start: 2021-09-27 — End: ?
  Filled 2021-09-27: qty 2

## 2021-09-27 MED ORDER — PAPAVERINE HCL 30 MG/ML IJ SOLN
INTRAMUSCULAR | Status: AC
  Filled 2021-09-27: qty 2

## 2021-09-27 SURGICAL SUPPLY — 53 items
APPLIER CLIP 11 MED OPEN (CLIP)
APPLIER CLIP 9.375 SM OPEN (CLIP)
BAG DECANTER FOR FLEXI CONT (MISCELLANEOUS) IMPLANT
BLADE SURG SZ11 CARB STEEL (BLADE) IMPLANT
BOOT SUTURE AID YELLOW STND (SUTURE) IMPLANT
BRUSH SCRUB EZ  4% CHG (MISCELLANEOUS)
BRUSH SCRUB EZ 4% CHG (MISCELLANEOUS) IMPLANT
CHLORAPREP W/TINT 26 (MISCELLANEOUS) IMPLANT
CLIP APPLIE 11 MED OPEN (CLIP) IMPLANT
CLIP APPLIE 9.375 SM OPEN (CLIP) IMPLANT
DERMABOND ADVANCED (GAUZE/BANDAGES/DRESSINGS)
DERMABOND ADVANCED .7 DNX12 (GAUZE/BANDAGES/DRESSINGS) IMPLANT
DRESSING SURGICEL FIBRLLR 1X2 (HEMOSTASIS) IMPLANT
DRSG SURGICEL FIBRILLAR 1X2 (HEMOSTASIS)
ELECT CAUTERY BLADE 6.4 (BLADE) IMPLANT
ELECT REM PT RETURN 9FT ADLT (ELECTROSURGICAL)
ELECTRODE REM PT RTRN 9FT ADLT (ELECTROSURGICAL) IMPLANT
GLOVE SURG SYN 8.0 (GLOVE) IMPLANT
GOWN STRL REUS W/ TWL LRG LVL3 (GOWN DISPOSABLE) IMPLANT
GOWN STRL REUS W/ TWL XL LVL3 (GOWN DISPOSABLE) IMPLANT
GOWN STRL REUS W/TWL LRG LVL3 (GOWN DISPOSABLE)
GOWN STRL REUS W/TWL XL LVL3 (GOWN DISPOSABLE)
IV NS 500ML (IV SOLUTION)
IV NS 500ML BAXH (IV SOLUTION) IMPLANT
KIT TURNOVER KIT A (KITS) IMPLANT
LABEL OR SOLS (LABEL) IMPLANT
LOOP RED MAXI  1X406MM (MISCELLANEOUS)
LOOP VESSEL MAXI 1X406 RED (MISCELLANEOUS) IMPLANT
LOOP VESSEL MINI 0.8X406 BLUE (MISCELLANEOUS) IMPLANT
LOOPS BLUE MINI 0.8X406MM (MISCELLANEOUS)
MANIFOLD NEPTUNE II (INSTRUMENTS) IMPLANT
NEEDLE FILTER BLUNT 18X 1/2SAF (NEEDLE)
NEEDLE FILTER BLUNT 18X1 1/2 (NEEDLE) IMPLANT
NS IRRIG 500ML POUR BTL (IV SOLUTION) IMPLANT
PACK EXTREMITY ARMC (MISCELLANEOUS) IMPLANT
PAD PREP 24X41 OB/GYN DISP (PERSONAL CARE ITEMS) IMPLANT
STOCKINETTE STRL 4IN 9604848 (GAUZE/BANDAGES/DRESSINGS) IMPLANT
SUT GTX CV-6 30 (SUTURE) IMPLANT
SUT MNCRL+ 5-0 UNDYED PC-3 (SUTURE) IMPLANT
SUT MONOCRYL 5-0 (SUTURE)
SUT PROLENE 6 0 BV (SUTURE) IMPLANT
SUT SILK 2 0 (SUTURE)
SUT SILK 2 0 SH (SUTURE) IMPLANT
SUT SILK 2-0 18XBRD TIE 12 (SUTURE) IMPLANT
SUT SILK 3 0 (SUTURE)
SUT SILK 3-0 18XBRD TIE 12 (SUTURE) IMPLANT
SUT SILK 4 0 (SUTURE)
SUT SILK 4-0 18XBRD TIE 12 (SUTURE) IMPLANT
SUT VIC AB 3-0 SH 27 (SUTURE)
SUT VIC AB 3-0 SH 27X BRD (SUTURE) IMPLANT
SYR 20ML LL LF (SYRINGE) IMPLANT
SYR 3ML LL SCALE MARK (SYRINGE) IMPLANT
WATER STERILE IRR 500ML POUR (IV SOLUTION) IMPLANT

## 2021-09-27 NOTE — Anesthesia Preprocedure Evaluation (Signed)
Anesthesia Evaluation  ?Patient identified by MRN, date of birth, ID band ?Patient awake ? ? ? ?Reviewed: ?Allergy & Precautions, H&P , NPO status , reviewed documented beta blocker date and time  ? ?Airway ?Mallampati: II ? ?TM Distance: >3 FB ?Neck ROM: full ? ? ? Dental ? ?(+) Poor Dentition, Chipped, Missing ?Very poor teeth:   ?Pulmonary ?shortness of breath, Current Smoker and Patient abstained from smoking.,  ?H/o pleural effusion- s/p thoracentesis with a 900 cc 10/22 ? ?Recent treatment for PNA ? ?Pt with cough. Had cough previous admission. Denies worsening SOB ?  ?Pulmonary exam normal ? ? ? ? ? ? ? Cardiovascular ?hypertension, Pt. on medications ?pulmonary hypertension (mild)+CHF (04/10/2021: EF <20%; global HK, mild PAH, LA sev dilated; mod-sev TR; G2DD.) and + DOE  ?(-) Orthopnea Normal cardiovascular exam ? ?H/O cardiac arrest: during HD Tx at [facility] --> went into PEA cardiac arrest and was intubated; favored to be secondary to acute respiratory failure due to volume overload and HYPERkalemia associated with non-compliance with HD schedule. ? ?Prolonged QT interval ?  ?Neuro/Psych ?Wheelchair bound ?Incontinent of feces ?CVA (Righted weakness, subjective. ) negative psych ROS  ? GI/Hepatic ?GERD  ,(+)  ?  ? substance abuse ? cocaine use and marijuana use,   ?Endo/Other  ? ? Renal/GU ?Dialysis and ESRFRenal disease  ? ?neurogenic bladder with right nephrostomy tube ? ?  ?Musculoskeletal ?Generalized weakness ?- wheelchair bound at baseline  ? Abdominal ?Normal abdominal exam  (+)   ?Peds ? Hematology ? ?(+) Blood dyscrasia, anemia ,   ?Anesthesia Other Findings ?Pt with past medical history including pulmonary hypertension, combined CHF, CVA, hypertension, neurogenic bladder with right nephrostomy tube, and end-stage renal disease on hemodialysis presents for HD graft placement. He has been evaluate recently by cardiologist and managed by internist for HD needs and CV  optimization. He had elevated troponins due to volume overload and recent cocaine use. No ischemic work-up is suggested by cardiology. Will proceed without drug screen today since he has been admitted for 6 days and does not produce urine. His nephrostomy bag is not emptied frequently so it is not a reliable source. [place] lab does not perform serum drug screens. ? ? He presented 5/5 for this procedure but was canceled for further optimization due to SOB and cocaine use. He had a recent ED visit 5/2 due to SOB as a transfer from dialysis. He had missed his recent HD sessions. He had UF HD on 5/3 that was non completed, 1200 mL of UF obtained.  ? ?Summary of recent ED work up: ?Workup: ECG, CBC, BMP, Troponin, BNP, CXR ?Findings: ?EKG: No STEMI and no evidence of Brugada?s sign, delta wave, epsilon wave, significantly prolonged QTc, or malignant arrhythmia. ?BNP: >4500 ?CXR: Bibasilar pleural effusions ?Based on history, exam and findings, presentation most consistent with acute on chronic CHF. ?Reassessment: Symptoms improved in ED with oxygen and diuresis ? ?Admission from 08/2021 reviewed: ?Treated for UTI and Healthcare-associated pneumonia  ?Stable Pleural effusion ?Dialysis treatment ? ? 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 ?Past Surgical History: ?08/07/2019: AV FISTULA PLACEMENT; Right ?    Comment:  Procedure: ARTERIOVENOUS (AV) FISTULA CREATION;   ?  Surgeon: Algernon Huxley, MD;  Location: [facility] ORS;  Service: ?             Vascular;  Laterality: Right; ?No date: NEPHRECTOMY; Left ?No date: NEPHRECTOMY ?10/27/2016: ORCHIECTOMY; Right ?    Comment:  Procedure: PSB ORCHIECTOMY;  Surgeon: Hollice Espy,  ?             MD;  Location: [facility]  ORS;  Service: Urology;  Laterality:  ?             Right; ?10/27/2016: ORCHIOPEXY; Bilateral ?    Comment:  Procedure: ORCHIOPEXY ADULT;  Surgeon: Hollice Espy,  ?             MD;  Location: [facility] ORS;  Service: Urology;  Laterality:  ?             Bilateral; ?10/27/2016: SCROTAL EXPLORATION; Bilateral ?    Comment:  Procedure: SCROTUM EXPLORATION;  Surgeon: Erlene Quan,  ?             Caryl Pina, MD;  Location: [facility] ORS;  Service: Urology;   ?             Laterality: Bilateral; ? ? ? ? Reproductive/Obstetrics ? ?  ? ? ? ? ? ? ? ? ? ? ? ? ? ?  ?  ? ? ? ? ? ? ? ? ?Anesthesia Physical ? ?Anesthesia Plan ? ?ASA: 4 ? ?Anesthesia Plan: MAC  ? ?Post-op Pain Management:  Regional for Post-op pain and Regional block*  ? ?Induction: Intravenous ? ?PONV Risk Score and Plan: Treatment may vary due to age or medical condition and TIVA ? ?Airway Management Planned: Natural Airway and Simple Face Mask ? ?Additional Equipment:  ? ?Intra-op Plan:  ? ?Post-operative Plan:  ? ?Informed Consent:  ? ?Plan Discussed with: CRNA ? ?Anesthesia Plan Comments:   ? ? ? ? ? ? ?Anesthesia Quick Evaluation ? ?

## 2021-09-27 NOTE — Progress Notes (Addendum)
Pt refused his 600 heparin injection, the risks and benefits explained to pt and he verbalized understanding. The hospitalist on call was notified. Pt been NPO since midnight for morning procedure.  ?

## 2021-09-27 NOTE — Progress Notes (Signed)
?Progress Note ? ? ?Patient: Larry Morrison WNU:[id number] DOB: 1970-12-13 DOA: 09/23/2021     4 ?DOS: the patient was seen and examined on 09/27/2021 ?  ?Brief hospital course: ?Larry Morrison is a 51 y.o. male with medical history significant of ESRD-HD (MWF), hypertension, stroke, cocaine abuse, tobacco abuse, pulmonary hypertension, CHF with EF of<20%, anemia, cardiac arrest, who presented to the ED on 09/23/2021 with progressively worsening with shortness of breath over several days.  He had presented for outpatient vascular procedure that was canceled due to his shortness of breath.  He was seen in ED on 5/3 due to shortness of breath, and was thought to be due to missing 2 of his dialysis.  Patient was dialyzed the day before presenting. ?  ?Of note, patient has previously had resection of the right brachial axillary AV graft.  He currently has no permanent extremity dialysis access.  He is maintained via PermCath which has been working but his dialysis runs have not been ideal. Patient is scheduled for creation of a left brachial axillary AV graft by Dr. Delana Meyer of vascular surgery, but this was canceled due to shortness of breath. ?  ?ED Course: WBC 6.6, BNP> 4500, UDS positive for cocaine, temperature normal, blood pressure 191/80, heart rate 94, RR 16, oxygen saturation 91-100% on room air.  X-ray showed stable bilateral pleural effusions, left greater than right, with associated atelectasis. ? ? ? ?Assessment and Plan: ?* SOB (shortness of breath) ?Etiology is not clear, likely multifactorial etiology, including ESRD, sCHF, bilateral pleural effusion.  Patient has elevated troponin level 157 --> 268, but no chest pain, which is likely due to demand ischemia.  Dr. Holley Raring of renal was consulted for dialysis ? ?-Bronchodilators ?-Dialysis per renal ? ?ESRD on hemodialysis ([place]) ?Nephrology following for dialysis. ?Vascular surgery planning to create new AV graft - on schedule for this afternoon.   ?NPO until after  procedure. ? ?Chronic combined systolic and diastolic CHF (congestive heart failure) ([place]) ?Due to dilated cardiomyopathy.  2D echo 04/12/2021 showed EF<20, BNP > 4500.  Patient may have mild fluid overload. ?-Volume management per renal by dialysis ?-Cardiology following ?-No plans for ischemic evaluation, patient has reportedly declined this ?-Absolute cessation from cocaine and illicit drugs ? ?Stroke [facility]) ?- Start aspirin and Lipitor ? ?Cocaine use disorder ([place]) ?Cocaine positive on UDS in the ED. ?Amlodipine for hypertension ? ? ?Malnutrition of moderate degree ?- Start Ensure ? ?Elevated troponin ?Troponin level 157 --> 268.   ?No chest pain.   ?Likely due to demand ischemia and poor clearance in the setting of ESRD.  -Cardiology consulted, follow-up recommendations ?-Started on aspirin and Lipitor ?-Trend troponin ? ? ?Anemia in ESRD (end-stage renal disease) ([place]) ?Hemoglobin stable. ?Monitor CBC ? ?Essential hypertension ?Continue amlodipine given cocaine abuse. ?Volume removal by dialysis ? ?Tobacco use disorder ?- Nicotine patch ? ? ? ? ?  ? ?Subjective: Pt seen this AM.  He is hungry, NPO for surgery this afternoon.  No other acute complaints.  ? ?Physical Exam: ?Vitals:  ? 09/26/21 2036 09/27/21 0551 09/27/21 0840 09/27/21 1454  ?BP: 135/89 (!) 157/96 (!) 137/96 134/88  ?Pulse: 88 95 89 84  ?Resp: 18 18 16 16   ?Temp: 97.7 ?F (36.5 ?C) 98.2 ?F (36.8 ?C) 98.3 ?F (36.8 ?C) 97.9 ?F (36.6 ?C)  ?TempSrc: Oral Oral Oral Temporal  ?SpO2: 96% 93% 93% 96%  ?Weight:    45.6 kg  ?Height:    5\' 4"  (1.626 m)  ? ?General exam: Awake sitting  up in bed, dialysis underway, frail and chronically ill-appearing ?HEENT: Moist mucous membranes, hearing grossly normal ?Respiratory system: normal respiratory effort on room air. ?Cardiovascular system: no peripheral edema ?Central nervous system: A&O x3, grossly nonfocal exam, normal speech ?Extremities: moves all, no edema, normal tone ?Psychiatry: Normal mood, congruent  affect ? ? ?Data Reviewed: ? ?No new labs to review at this time ? ?Family Communication: None ? ?Disposition: ?Status is: Inpatient ?Remains inpatient appropriate because: Severity of illness as outlined above, procedure planned.  Discharge pending clearance by vascular, nephrology.  DSS involved petitioning for guardianship. ? ? Planned Discharge Destination: Home ? ? ? ?Time spent: 35 minutes ? ?Author: ?Ezekiel Slocumb, DO ?09/27/2021 4:35 PM ? ?For on call review [email or web address].  ?

## 2021-09-27 NOTE — Progress Notes (Signed)
[facility]  ROUNDING NOTE   Subjective:   Larry Morrison is a 51 year old African-American male with past medical history including pulmonary hypertension, combined CHF, CVA, hypertension, neurogenic bladder with right nephrostomy tube, and end-stage renal disease on hemodialysis.    Patient presented to vascular surgery for placement of an AV Gore-Tex graft for dialysis use.  Treatment team concerned about frail appearance of patient intolerance and scheduled procedure.  Patient will be admitted for AV graft malfunction (HCC) [T82.590A] SOB (shortness of breath) [R06.02]  Patient is known to our practice and receives outpatient dialysis treatments at [facility] on a MWF schedule,   Patient recently presented to the emergency department with complaints of shortness of breath after missing 2 dialysis treatments.     Patient main concern in today's visit was about the scheduled for his procedure.  I then reached out to the nursing team/OR team and informed the patient that he is going for procedure most likely at 2:00.    Objective:  Vital signs in last 24 hours:  Temp:  [97.7 F (36.5 C)-98.8 F (37.1 C)] 98.2 F (36.8 C) (05/09 0551) Pulse Rate:  [79-95] 95 (05/09 0551) Resp:  [18-43] 18 (05/09 0551) BP: (121-158)/(77-102) 157/96 (05/09 0551) SpO2:  [90 %-100 %] 93 % (05/09 0551) Weight:  [45.6 kg] 45.6 kg (05/08 1253)  Weight change:  Filed Weights   09/23/21 2300 09/25/21 0505 09/26/21 1253  Weight: 49 kg 47 kg 45.6 kg    Intake/Output: I/O last 3 completed shifts: In: 0  Out: 1680 [Urine:680; Other:1000]   Intake/Output this shift:  No intake/output data recorded.  Physical Exam: General: NAD, resting quietly  Head: Normocephalic, atraumatic. Moist oral mucosal membranes  Eyes: Anicteric  Lungs:  Clear to auscultation, normal effort on room air  Heart: Regular rate and rhythm  Abdomen:  Soft, nontender, nondistended  Extremities: No peripheral  edema.  Neurologic: Alert, moving all four extremities  Skin: No lesions  Access: Right IJ PermCath  Right nephrostomy tube.  Basic Metabolic Panel: Recent Labs  Lab 09/21/21 1153 09/23/21 0834 09/24/21 0043 09/25/21 0425 09/27/21 0453  NA 139 142 138 141 139  K 4.8 4.1 3.5 3.8 4.0  CL 102 107 99 104 101  CO2 18*  --  28 25 26   GLUCOSE 81 109* 85 103* 86  BUN 82* 59* 35* 54* 32*  CREATININE 10.25* 9.60* 4.81* 7.41* 5.32*  CALCIUM 8.1*  --  7.7* 7.5* 7.8*  MG  --   --  1.6* 2.0  --   PHOS 11.1*  --   --   --  4.6    Liver Function Tests: Recent Labs  Lab 09/21/21 1153 09/27/21 0453  AST 28  --   ALT 34  --   ALKPHOS 182*  --   BILITOT 1.0  --   PROT 9.1*  --   ALBUMIN 3.1* 2.3*   No results for input(s): LIPASE, AMYLASE in the last 168 hours. No results for input(s): AMMONIA in the last 168 hours.  CBC: Recent Labs  Lab 09/21/21 1153 09/23/21 0834 09/23/21 0951 09/24/21 0043 09/27/21 0453  WBC 5.5  --  6.6 5.4 6.9  NEUTROABS 3.5  --   --   --   --   HGB 11.3* 13.3 10.8* 9.7* 10.0*  HCT 36.2* 39.0 34.9* 31.4* 32.2*  MCV 95.0  --  95.6 95.2 95.0  PLT 239  --  208 177 175    Cardiac Enzymes: No results  for input(s): CKTOTAL, CKMB, CKMBINDEX, TROPONINI in the last 168 hours.  BNP: Invalid input(s): POCBNP  CBG: Recent Labs  Lab 09/23/21 0810  GLUCAP 76    Microbiology: Results for orders placed or performed during the hospital encounter of 09/23/21  Resp Panel by RT-PCR (Flu A&B, Covid) Nasopharyngeal Swab     Status: None   Collection Time: 09/23/21  4:21 PM   Specimen: Nasopharyngeal Swab; Nasopharyngeal(NP) swabs in vial transport medium  Result Value Ref Range Status   SARS Coronavirus 2 by RT PCR NEGATIVE NEGATIVE Final    Comment: (NOTE) SARS-CoV-2 target nucleic acids are NOT DETECTED.  The SARS-CoV-2 RNA is generally detectable in upper respiratory specimens during the acute phase of infection. The lowest concentration of SARS-CoV-2  viral copies this assay can detect is 138 copies/mL. A negative result does not preclude SARS-Cov-2 infection and should not be used as the sole basis for treatment or other patient management decisions. A negative result may occur with  improper specimen collection/handling, submission of specimen other than nasopharyngeal swab, presence of viral mutation(s) within the areas targeted by this assay, and inadequate number of viral copies(<138 copies/mL). A negative result must be combined with clinical observations, patient history, and epidemiological information. The expected result is Negative.  Fact Sheet for Patients:  BloggerCourse.com  Fact Sheet for Healthcare Providers:  SeriousBroker.it  This test is no t yet approved or cleared by the [place] FDA and  has been authorized for detection and/or diagnosis of SARS-CoV-2 by FDA under an Emergency Use Authorization (EUA). This EUA will remain  in effect (meaning this test can be used) for the duration of the COVID-19 declaration under Section 564(b)(1) of the Act, 21 U.S.C.section 360bbb-3(b)(1), unless the authorization is terminated  or revoked sooner.       Influenza A by PCR NEGATIVE NEGATIVE Final   Influenza B by PCR NEGATIVE NEGATIVE Final    Comment: (NOTE) The Xpert Xpress SARS-CoV-2/FLU/RSV plus assay is intended as an aid in the diagnosis of influenza from Nasopharyngeal swab specimens and should not be used as a sole basis for treatment. Nasal washings and aspirates are unacceptable for Xpert Xpress SARS-CoV-2/FLU/RSV testing.  Fact Sheet for Patients: BloggerCourse.com  Fact Sheet for Healthcare Providers: SeriousBroker.it  This test is not yet approved or cleared by the [place] FDA and has been authorized for detection and/or diagnosis of SARS-CoV-2 by FDA under an Emergency Use Authorization  (EUA). This EUA will remain in effect (meaning this test can be used) for the duration of the COVID-19 declaration under Section 564(b)(1) of the Act, 21 U.S.C. section 360bbb-3(b)(1), unless the authorization is terminated or revoked.  Performed at [facility], [address] [place], [place] [address]     Coagulation Studies: No results for input(s): LABPROT, INR in the last 72 hours.  Urinalysis: No results for input(s): COLORURINE, LABSPEC, PHURINE, GLUCOSEU, HGBUR, BILIRUBINUR, KETONESUR, PROTEINUR, UROBILINOGEN, NITRITE, LEUKOCYTESUR in the last 72 hours.  Invalid input(s): APPERANCEUR     Imaging: No results found.   Medications:     ceFAZolin (ANCEF) IV        amLODipine  5 mg Oral QPM   aspirin EC  81 mg Oral Daily   atorvastatin  40 mg Oral Daily   calcium acetate  1,334 mg Oral TID WC   Chlorhexidine Gluconate Cloth  6 each Topical Daily   Chlorhexidine Gluconate Cloth  6 each Topical Once   epoetin (EPOGEN/PROCRIT) injection  4,000 Units Intravenous Q M,W,F-HD  famotidine  20 mg Oral Once   feeding supplement  237 mL Oral BID BM   heparin  5,000 Units Subcutaneous Q8H   irbesartan  150 mg Oral QPM   multivitamin  1 tablet Oral QHS   nicotine  21 mg Transdermal Q24H     Assessment/ Plan:  Larry Morrison is a 51 y.o.  male with past medical history including pulmonary hypertension, combined CHF, CVA, hypertension, neurogenic bladder with right nephrostomy tube, and end-stage renal disease on hemodialysis.    [facility]/MWF/Rt Permcath  End stage renal disease requiring hemodialysis.             AV graft placement postponed .           Patient underwent dialysis treatment on  yesterday on Monday   2. Anemia of chronic kidney disease Macrocytic Lab Results  Component Value Date   HGB 10.0 (L) 09/27/2021    Recieves mircera as outpt.   Epogen with hemodialysis treatments  3. Secondary Hyperparathyroidism:  Lab Results   Component Value Date   CALCIUM 7.8 (L) 09/27/2021   CAION 0.79 (LL) 09/23/2021   PHOS 4.6 09/27/2021    Calcium and phosphorus now within acceptable range.   We will continue to monitor.  On  calcium acetate with meals We will follow up on phosphorus  4. Hypertension with chronic kidney disease. Home regimen includes carvedilol and irbesartan.   Blood pressure better controlled after adding amlodipine to his regimen.     LOS: 4 Priest Lockridge s [facility] 5/9/20237:12 AM

## 2021-09-27 NOTE — Plan of Care (Signed)
?  Problem: Education: ?Goal: Knowledge of disease or condition will improve ?Outcome: Progressing ?Goal: Knowledge of the prescribed therapeutic regimen will improve ?Outcome: Progressing ?Goal: Individualized Educational Video(s) ?Outcome: Progressing ?  ?Problem: Activity: ?Goal: Ability to tolerate increased activity will improve ?Outcome: Not Progressing ?  ?Problem: Respiratory: ?Goal: Ability to maintain a clear airway will improve ?Outcome: Progressing ?Goal: Levels of oxygenation will improve ?Outcome: Progressing ?Goal: Ability to maintain adequate ventilation will improve ?Outcome: Progressing ?  ?

## 2021-09-27 NOTE — Interval H&P Note (Signed)
History and Physical Interval Note: ? ?09/27/2021 ?3:12 PM ? ?Larry Morrison  has presented today for surgery, with the diagnosis of End Stage Renal Disease.  The various methods of treatment have been discussed with the patient and family. After consideration of risks, benefits and other options for treatment, the patient has consented to  Procedure(s): ?INSERTION OF ARTERIOVENOUS (AV) GORE-TEX GRAFT ARM BRACHIAL AXILLARY (Left) as a surgical intervention.  The patient's history has been reviewed, patient examined, no change in status, stable for surgery.  I have reviewed the patient's chart and labs.  Questions were answered to the patient's satisfaction.   ? ? ?[name] ? ? ?

## 2021-09-28 DIAGNOSIS — R0602 Shortness of breath: Secondary | ICD-10-CM | POA: Diagnosis not present

## 2021-09-28 LAB — CBC
HCT: 30.1 % — ABNORMAL LOW (ref 39.0–52.0)
Hemoglobin: 9.3 g/dL — ABNORMAL LOW (ref 13.0–17.0)
MCH: 29.2 pg (ref 26.0–34.0)
MCHC: 30.9 g/dL (ref 30.0–36.0)
MCV: 94.7 fL (ref 80.0–100.0)
Platelets: 171 K/uL (ref 150–400)
RBC: 3.18 MIL/uL — ABNORMAL LOW (ref 4.22–5.81)
RDW: 16 % — ABNORMAL HIGH (ref 11.5–15.5)
WBC: 7.6 K/uL (ref 4.0–10.5)
nRBC: 0 % (ref 0.0–0.2)

## 2021-09-28 LAB — BASIC METABOLIC PANEL
Anion gap: 8 (ref 5–15)
BUN: 48 mg/dL — ABNORMAL HIGH (ref 6–20)
CO2: 26 mmol/L (ref 22–32)
Calcium: 7.5 mg/dL — ABNORMAL LOW (ref 8.9–10.3)
Chloride: 105 mmol/L (ref 98–111)
Creatinine, Ser: 7.02 mg/dL — ABNORMAL HIGH (ref 0.61–1.24)
GFR, Estimated: 9 mL/min — ABNORMAL LOW (ref >60)
Glucose, Bld: 121 mg/dL — ABNORMAL HIGH (ref 70–99)
Potassium: 3.9 mmol/L (ref 3.5–5.1)
Sodium: 139 mmol/L (ref 135–145)

## 2021-09-28 LAB — MRSA NEXT GEN BY PCR, NASAL: MRSA by PCR Next Gen: NOT DETECTED

## 2021-09-28 MED ORDER — CARVEDILOL 12.5 MG PO TABS
12.5000 mg | ORAL_TABLET | Freq: Two times a day (BID) | ORAL | Status: DC
  Administered 2021-09-28 – 2021-09-30 (×4): 12.5 mg via ORAL
  Filled 2021-09-28 (×6): qty 1

## 2021-09-28 MED ORDER — HEPARIN SODIUM (PORCINE) 1000 UNIT/ML IJ SOLN
INTRAMUSCULAR | Status: AC
  Filled 2021-09-28: qty 10

## 2021-09-28 NOTE — Progress Notes (Signed)
Pt refused his heparin injections during this shift. The risks and benefits of not taking heparin explained to pt and he verbalized understanding. The hospitalist on call was notified.  ?

## 2021-09-28 NOTE — Progress Notes (Addendum)
[facility]  ROUNDING NOTE   Subjective:   Larry Morrison is a 51 year old African-American male with past medical history including pulmonary hypertension, combined CHF, CVA, hypertension, neurogenic bladder with right nephrostomy tube, and end-stage renal disease on hemodialysis.    Patient presented to vascular surgery for placement of an AV Gore-Tex graft for dialysis use.  Treatment team concerned about frail appearance of patient intolerance and scheduled procedure.  Patient will be admitted for AV graft malfunction (HCC) [T82.590A] SOB (shortness of breath) [R06.02]  Patient is known to our practice and receives outpatient dialysis treatments at [facility] on a MWF schedule,   Patient recently presented to the emergency department with complaints of shortness of breath after missing 2 dialysis treatments.     Patient offers no new concenrs    Objective:  Vital signs in last 24 hours:  Temp:  [97.9 F (36.6 C)-99.1 F (37.3 C)] 99.1 F (37.3 C) (05/10 0436) Pulse Rate:  [84-89] 89 (05/10 0436) Resp:  [16-18] 18 (05/10 0436) BP: (121-137)/(76-96) 137/79 (05/10 0436) SpO2:  [91 %-96 %] 93 % (05/10 0436) Weight:  [45.6 kg] 45.6 kg (05/09 1454)  Weight change: 0 kg Filed Weights   09/25/21 0505 09/26/21 1253 09/27/21 1454  Weight: 47 kg 45.6 kg 45.6 kg    Intake/Output: I/O last 3 completed shifts: In: 480 [P.O.:480] Out: 680 [Urine:680]   Intake/Output this shift:  No intake/output data recorded.  Physical Exam: General: NAD, resting quietly  Head: Normocephalic, atraumatic. Moist oral mucosal membranes  Eyes: Anicteric  Lungs:  Clear to auscultation, normal effort on room air  Heart: Regular rate and rhythm  Abdomen:  Soft, nontender, nondistended  Extremities: No peripheral edema.  Neurologic: Alert, moving all four extremities  Skin: No lesions  Access: Right IJ PermCath  Right nephrostomy tube.   Basic Metabolic Panel: Recent Labs   Lab 09/21/21 1153 09/23/21 0834 09/24/21 0043 09/25/21 0425 09/27/21 0453 09/28/21 0539  NA 139 142 138 141 139 139  K 4.8 4.1 3.5 3.8 4.0 3.9  CL 102 107 99 104 101 105  CO2 18*  --  28 25 26 26   GLUCOSE 81 109* 85 103* 86 121*  BUN 82* 59* 35* 54* 32* 48*  CREATININE 10.25* 9.60* 4.81* 7.41* 5.32* 7.02*  CALCIUM 8.1*  --  7.7* 7.5* 7.8* 7.5*  MG  --   --  1.6* 2.0  --   --   PHOS 11.1*  --   --   --  4.6  --     Liver Function Tests: Recent Labs  Lab 09/21/21 1153 09/27/21 0453  AST 28  --   ALT 34  --   ALKPHOS 182*  --   BILITOT 1.0  --   PROT 9.1*  --   ALBUMIN 3.1* 2.3*   No results for input(s): LIPASE, AMYLASE in the last 168 hours. No results for input(s): AMMONIA in the last 168 hours.  CBC: Recent Labs  Lab 09/21/21 1153 09/23/21 0834 09/23/21 0951 09/24/21 0043 09/27/21 0453 09/28/21 0539  WBC 5.5  --  6.6 5.4 6.9 7.6  NEUTROABS 3.5  --   --   --   --   --   HGB 11.3* 13.3 10.8* 9.7* 10.0* 9.3*  HCT 36.2* 39.0 34.9* 31.4* 32.2* 30.1*  MCV 95.0  --  95.6 95.2 95.0 94.7  PLT 239  --  208 177 175 171    Cardiac Enzymes: No results for input(s): CKTOTAL, CKMB, CKMBINDEX,  TROPONINI in the last 168 hours.  BNP: Invalid input(s): POCBNP  CBG: Recent Labs  Lab 09/23/21 0810  GLUCAP 76    Microbiology: Results for orders placed or performed during the hospital encounter of 09/23/21  Resp Panel by RT-PCR (Flu A&B, Covid) Nasopharyngeal Swab     Status: None   Collection Time: 09/23/21  4:21 PM   Specimen: Nasopharyngeal Swab; Nasopharyngeal(NP) swabs in vial transport medium  Result Value Ref Range Status   SARS Coronavirus 2 by RT PCR NEGATIVE NEGATIVE Final    Comment: (NOTE) SARS-CoV-2 target nucleic acids are NOT DETECTED.  The SARS-CoV-2 RNA is generally detectable in upper respiratory specimens during the acute phase of infection. The lowest concentration of SARS-CoV-2 viral copies this assay can detect is 138 copies/mL. A  negative result does not preclude SARS-Cov-2 infection and should not be used as the sole basis for treatment or other patient management decisions. A negative result may occur with  improper specimen collection/handling, submission of specimen other than nasopharyngeal swab, presence of viral mutation(s) within the areas targeted by this assay, and inadequate number of viral copies(<138 copies/mL). A negative result must be combined with clinical observations, patient history, and epidemiological information. The expected result is Negative.  Fact Sheet for Patients:  BloggerCourse.com  Fact Sheet for Healthcare Providers:  SeriousBroker.it  This test is no t yet approved or cleared by the [place] FDA and  has been authorized for detection and/or diagnosis of SARS-CoV-2 by FDA under an Emergency Use Authorization (EUA). This EUA will remain  in effect (meaning this test can be used) for the duration of the COVID-19 declaration under Section 564(b)(1) of the Act, 21 U.S.C.section 360bbb-3(b)(1), unless the authorization is terminated  or revoked sooner.       Influenza A by PCR NEGATIVE NEGATIVE Final   Influenza B by PCR NEGATIVE NEGATIVE Final    Comment: (NOTE) The Xpert Xpress SARS-CoV-2/FLU/RSV plus assay is intended as an aid in the diagnosis of influenza from Nasopharyngeal swab specimens and should not be used as a sole basis for treatment. Nasal washings and aspirates are unacceptable for Xpert Xpress SARS-CoV-2/FLU/RSV testing.  Fact Sheet for Patients: BloggerCourse.com  Fact Sheet for Healthcare Providers: SeriousBroker.it  This test is not yet approved or cleared by the [place] FDA and has been authorized for detection and/or diagnosis of SARS-CoV-2 by FDA under an Emergency Use Authorization (EUA). This EUA will remain in effect (meaning this test can  be used) for the duration of the COVID-19 declaration under Section 564(b)(1) of the Act, 21 U.S.C. section 360bbb-3(b)(1), unless the authorization is terminated or revoked.  Performed at [facility], [address] [place], [place] [address]     Coagulation Studies: No results for input(s): LABPROT, INR in the last 72 hours.  Urinalysis: No results for input(s): COLORURINE, LABSPEC, PHURINE, GLUCOSEU, HGBUR, BILIRUBINUR, KETONESUR, PROTEINUR, UROBILINOGEN, NITRITE, LEUKOCYTESUR in the last 72 hours.  Invalid input(s): APPERANCEUR     Imaging: [place] OR NERVE BLOCK-IMAGE ONLY [facility])  Result Date: 09/27/2021 There is no interpretation for this exam.  This order is for images obtained during a surgical procedure.  Please See "Surgeries" Tab for more information regarding the procedure.     Medications:        amLODipine  5 mg Oral QPM   aspirin EC  81 mg Oral Daily   atorvastatin  40 mg Oral Daily   calcium acetate  1,334 mg Oral TID WC   Chlorhexidine Gluconate Cloth  6 each Topical Daily   epoetin (EPOGEN/PROCRIT) injection  4,000 Units Intravenous Q M,W,F-HD   famotidine  20 mg Oral Once   feeding supplement  237 mL Oral BID BM   heparin  5,000 Units Subcutaneous Q8H   irbesartan  150 mg Oral QPM   multivitamin  1 tablet Oral QHS   nicotine  21 mg Transdermal Q24H     Assessment/ Plan:    [name] is a 51 y.o.  male with past medical history including pulmonary hypertension, combined CHF, CVA, hypertension, neurogenic bladder with right nephrostomy tube, and end-stage renal disease on hemodialysis.    [facility]/MWF/Rt Permcath  End stage renal disease requiring hemodialysis.             AV graft placement postponed .           Patient will be dialyzed today.    2. Anemia of chronic kidney disease Macrocytic Lab Results  Component Value Date   HGB 9.3 (L) 09/28/2021    Recieves mircera as outpt.   Epogen with hemodialysis  treatments  3. Secondary Hyperparathyroidism:  Lab Results  Component Value Date   CALCIUM 7.5 (L) 09/28/2021   CAION 0.79 (LL) 09/23/2021   PHOS 4.6 09/27/2021    Calcium and phosphorus now within acceptable range.   We will continue to monitor.  On  calcium acetate with meals We will follow up on phosphorus  4. Hypertension with chronic kidney disease. Home regimen includes carvedilol and irbesartan.    Blood pressure better controlled after adding amlodipine to his regimen.    Addendum Patient was evaluated on dialysis Patient tolerated treatment well   LOS: 5 Larry Morrison s Larry Morrison 5/10/20237:45 AM

## 2021-09-28 NOTE — Progress Notes (Addendum)
? ? ?PROGRESS NOTE ? ? ? ?Larry Morrison  SWF:[id number] DOB: 29-Jan-1971  ?DOA: 09/23/2021 ?Date of Service: Today @TODAY @ which is Hospital Day 5 ?PCP: [facility] ? ? ? ? ?Brief Narrative & Hospital COurse:  ? ?Larry Morrison is a 51 y.o. male with medical history significant of ESRD-HD (MWF), hypertension, stroke, cocaine abuse, tobacco abuse, pulmonary hypertension, CHF with EF of<20%, anemia, cardiac arrest, who presented to the ED on 09/23/2021 with progressively worsening with shortness of breath over several days.  He had presented for outpatient vascular procedure that was canceled due to his shortness of breath.  He was seen in ED on 5/3 due to shortness of breath, and was thought to be due to missing 2 of his dialysis.  Patient was dialyzed the day before presenting. ED Course: WBC 6.6, BNP> 4500, UDS positive for cocaine, temperature normal, blood pressure 191/80, heart rate 94, RR 16, oxygen saturation 91-100% on room air.  X-ray showed stable bilateral pleural effusions, left greater than right, with associated atelectasis.  ?  ?Of note, patient has previously had resection of the right brachial axillary AV graft.  As of admission he had no permanent extremity dialysis access.  He had been maintained via PermCath which has been working but his dialysis runs have not been ideal. Patient is scheduled for creation of a left brachial axillary AV graft by Dr. Delana Meyer of vascular surgery, but this was canceled due to shortness of breath, later canclled inpatient d/t persistence of cocaine metabolites on UDS.  ?  ? ? ?Consultants:  ?Nephrology - maintain ESRD dialysis  ?Vascular Surgery - need graft / permanent dialysis access  ?Cardiology - elevated troponins d/t demand ischemia ? ?Procedures: ?Dialysis routine ? ? ? ?Subjective: ?Patient seen and examined this morning in dialysis lab, no concerns or complaints.  States he is feeling well, anxious to go home.  Denies any pain, dizziness, headache.   ? ? ? ? ? ? ?ASSESSMENT & PLAN: ?  ?Principal Problem: ?  SOB (shortness of breath) ?Active Problems: ?  Tobacco use disorder ?  Essential hypertension ?  Anemia in ESRD (end-stage renal disease) ([place]) ?  Elevated troponin ?  Malnutrition of moderate degree ?  ESRD on hemodialysis ([place]) ?  Cocaine use disorder ([place]) ?  Stroke [facility]) ?  Chronic combined systolic and diastolic CHF (congestive heart failure) ([place]) ? ? ?SOB (shortness of breath) ?Resolved now ?Likely multifactorial etiology, including ESRD, sCHF, bilateral pleural effusion.  Patient has elevated troponin level 157 --> 268, but no chest pain, which is likely due to demand ischemia.  Dr. Holley Raring of renal was consulted for dialysis ?Bronchodilators ?Dialysis per renal ? ?ESRD on hemodialysis ([place]) ?Nephrology following for dialysis. ?Vascular surgery planning to create new AV graft - on schedule for 09/30/21.   ?NPO after midnight on date of planned procedure. ? ?Chronic combined systolic and diastolic CHF (congestive heart failure) ([place]) ?Due to dilated cardiomyopathy.  2D echo 04/12/2021 showed EF<20, BNP > 4500.  Patient may have mild fluid overload. ?Volume management per renal by dialysis ?Cardiology consulted, No plans for ischemic evaluation, patient has reportedly declined this ?Absolute cessation from cocaine and illicit drugs ? ?Tobacco use disorder ?Nicotine patch ? ?Anemia in ESRD (end-stage renal disease) ([place]) ?Hemoglobin stable. ?Monitor CBC ? ?Elevated troponin ?Troponin level 157 --> 268, no chest pain likely due to demand ischemia and poor clearance in the setting of ESRD.   ?Cardiology consulted, started on aspirin and Lipitor ? ? ?Malnutrition of  moderate degree ?continue Ensure ? ?Cocaine use disorder ([place]) ?Cocaine positive on UDS in the ED and on repeated UDS prior to graft procedure - cancelled procedure  ?Amlodipine for hypertension ? ? ?History of ischemic stroke ? aspirin and Lipitor ? ?Essential hypertension ?Continue amlodipine  given cocaine abuse. ?Volume removal by dialysis ? ? ? ?DVT prophylaxis: heparin ?Code Status: FULL ?Family Communication: none at this time ?Disposition Plan: discharge home pending clearance from vascular surgery, concern for illicit drug abstinence at home, planning for AV graft inpatient in 2 days (09/30/21)  ? ? ? ? ? ?Objective: ?Vitals:  ? 09/28/21 1247 09/28/21 1248 09/28/21 1252 09/28/21 1321  ?BP: (!) 144/93   (!) 144/90  ?Pulse: 77 80 81 86  ?Resp: (!) 24 (!) 40 (!) 29 16  ?Temp:  98.4 ?F (36.9 ?C)  98.2 ?F (36.8 ?C)  ?TempSrc:  Oral  Oral  ?SpO2: (!) 88% 100% 100% 100%  ?Weight:  43.6 kg    ?Height:      ? ? ?Intake/Output Summary (Last 24 hours) at 09/28/2021 1357 ?Last data filed at 09/28/2021 1233 ?Gross per 24 hour  ?Intake 480 ml  ?Output 1250 ml  ?Net -770 ml  ? ?Filed Weights  ? 09/27/21 1454 09/28/21 0913 09/28/21 1248  ?Weight: 45.6 kg 44.4 kg 43.6 kg  ? ? ?Examination: ? ?General exam: Appears calm and comfortable  ?Respiratory system: Clear to auscultation. Respiratory effort normal. ?Cardiovascular system: S1 & S2 heard, RRR. No JVD, murmurs, rubs, gallops or clicks. No pedal edema. ?Gastrointestinal system: Abdomen is nondistended, soft and nontender. No organomegaly or masses felt. Normal bowel sounds heard. ?Central nervous system: Alert and oriented. No focal neurological deficits. ?Extremities: Symmetric 5 x 5 power. ?Skin: No rashes, lesions or ulcers ?Psychiatry: Judgement and insight appear normal. Mood & affect appropriate.  ? ? ? ? ? ?Scheduled Medications:  ? amLODipine  5 mg Oral QPM  ? aspirin EC  81 mg Oral Daily  ? atorvastatin  40 mg Oral Daily  ? calcium acetate  1,334 mg Oral TID WC  ? carvedilol  12.5 mg Oral BID  ? Chlorhexidine Gluconate Cloth  6 each Topical Daily  ? epoetin (EPOGEN/PROCRIT) injection  4,000 Units Intravenous Q M,W,F-HD  ? famotidine  20 mg Oral Once  ? feeding supplement  237 mL Oral BID BM  ? heparin  5,000 Units Subcutaneous Q8H  ? heparin sodium  (porcine)      ? irbesartan  150 mg Oral QPM  ? multivitamin  1 tablet Oral QHS  ? nicotine  21 mg Transdermal Q24H  ? ? ?Continuous Infusions: ? ? ?PRN Medications:  ?acetaminophen, albuterol, dextromethorphan-guaiFENesin, hydrALAZINE, ipratropium-albuterol ? ?Antimicrobials:  ?Anti-infectives (From admission, onward)  ? ? Start     Dose/Rate Route Frequency Ordered Stop  ? 09/27/21 0600  ceFAZolin (ANCEF) IVPB 2g/100 mL premix  Status:  Discontinued       ? 2 g ?200 mL/hr over 30 Minutes Intravenous On call to O.R. 09/27/21 0146 09/27/21 1634  ? 09/23/21 0809  ceFAZolin (ANCEF) 2-4 GM/100ML-% IVPB       ?Note to Pharmacy: [place] [facility], [place]: cabinet override  ?    09/23/21 0809 09/23/21 2014  ? 09/23/21 0600  ceFAZolin (ANCEF) IVPB 2g/100 mL premix  Status:  Discontinued       ? 2 g ?200 mL/hr over 30 Minutes Intravenous On call to O.R. 09/23/21 0319 09/23/21 1358  ? ?  ? ? ?Data Reviewed: I have personally reviewed  following labs and imaging studies ? ?CBC: ?Recent Labs  ?Lab 09/23/21 ?8563 09/23/21 ?1497 09/24/21 ?0263 09/27/21 ?7858 09/28/21 ?8502  ?WBC  --  6.6 5.4 6.9 7.6  ?HGB 13.3 10.8* 9.7* 10.0* 9.3*  ?HCT 39.0 34.9* 31.4* 32.2* 30.1*  ?MCV  --  95.6 95.2 95.0 94.7  ?PLT  --  208 177 175 171  ? ?Basic Metabolic Panel: ?Recent Labs  ?Lab 09/23/21 ?7741 09/24/21 ?2878 09/25/21 ?0425 09/27/21 ?6767 09/28/21 ?2094  ?NA 142 138 141 139 139  ?K 4.1 3.5 3.8 4.0 3.9  ?CL 107 99 104 101 105  ?CO2  --  28 25 26 26   ?GLUCOSE 109* 85 103* 86 121*  ?BUN 59* 35* 54* 32* 48*  ?CREATININE 9.60* 4.81* 7.41* 5.32* 7.02*  ?CALCIUM  --  7.7* 7.5* 7.8* 7.5*  ?MG  --  1.6* 2.0  --   --   ?PHOS  --   --   --  4.6  --   ? ?GFR: ?Estimated Creatinine Clearance: 7.5 mL/min (A) (by C-G formula based on SCr of 7.02 mg/dL (H)). ?Liver Function Tests: ?Recent Labs  ?Lab 09/27/21 ?7096  ?ALBUMIN 2.3*  ? ?No results for input(s): LIPASE, AMYLASE in the last 168 hours. ?No results for input(s): AMMONIA in the last 168  hours. ?Coagulation Profile: ?No results for input(s): INR, PROTIME in the last 168 hours. ?Cardiac Enzymes: ?No results for input(s): CKTOTAL, CKMB, CKMBINDEX, TROPONINI in the last 168 hours. ?BNP (last 3 results) ?No results for

## 2021-09-29 DIAGNOSIS — R0602 Shortness of breath: Secondary | ICD-10-CM | POA: Diagnosis not present

## 2021-09-29 NOTE — Progress Notes (Addendum)
[facility]  ROUNDING NOTE   Subjective:   Larry Morrison is a 51 year old African-American male with past medical history including pulmonary hypertension, combined CHF, CVA, hypertension, neurogenic bladder with right nephrostomy tube, and end-stage renal disease on hemodialysis.    Patient presented to vascular surgery for placement of an AV Gore-Tex graft for dialysis use.  Treatment team concerned about frail appearance of patient intolerance and scheduled procedure.  Patient will be admitted for AV graft malfunction (HCC) [T82.590A] SOB (shortness of breath) [R06.02]  Patient is known to our practice and receives outpatient dialysis treatments at [facility] on a MWF schedule,   Patient recently presented to the emergency department with complaints of shortness of breath after missing 2 dialysis treatments.     Patient offers no new concenrs    Objective:  Vital signs in last 24 hours:  Temp:  [97.9 F (36.6 C)-98.5 F (36.9 C)] 98 F (36.7 C) (05/11 0813) Pulse Rate:  [75-92] 75 (05/11 0813) Resp:  [16-40] 18 (05/11 0813) BP: (101-148)/(60-94) 115/71 (05/11 0813) SpO2:  [88 %-100 %] 98 % (05/11 0813) Weight:  [43.6 kg-45 kg] 45 kg (05/11 0407)  Weight change: -1.2 kg Filed Weights   09/28/21 0913 09/28/21 1248 09/29/21 0407  Weight: 44.4 kg 43.6 kg 45 kg    Intake/Output: I/O last 3 completed shifts: In: 1080 [P.O.:1080] Out: 1300 [Urine:300; Other:1000]   Intake/Output this shift:  No intake/output data recorded.  Physical Exam: General: NAD, resting quietly  Head: Normocephalic, atraumatic. Moist oral mucosal membranes  Eyes: Anicteric  Lungs:  Clear to auscultation, normal effort on room air  Heart: Regular rate and rhythm  Abdomen:  Soft, nontender, nondistended  Extremities: No peripheral edema.  Neurologic: Alert, moving all four extremities  Skin: No lesions  Access: Right IJ PermCath  Right nephrostomy tube.   Basic Metabolic  Panel: Recent Labs  Lab 09/23/21 0834 09/24/21 0043 09/24/21 0043 09/25/21 0425 09/27/21 0453 09/28/21 0539  NA 142 138  --  141 139 139  K 4.1 3.5  --  3.8 4.0 3.9  CL 107 99  --  104 101 105  CO2  --  28  --  25 26 26   GLUCOSE 109* 85  --  103* 86 121*  BUN 59* 35*  --  54* 32* 48*  CREATININE 9.60* 4.81*  --  7.41* 5.32* 7.02*  CALCIUM  --  7.7*   < > 7.5* 7.8* 7.5*  MG  --  1.6*  --  2.0  --   --   PHOS  --   --   --   --  4.6  --    < > = values in this interval not displayed.    Liver Function Tests: Recent Labs  Lab 09/27/21 0453  ALBUMIN 2.3*   No results for input(s): LIPASE, AMYLASE in the last 168 hours. No results for input(s): AMMONIA in the last 168 hours.  CBC: Recent Labs  Lab 09/23/21 0834 09/23/21 0951 09/24/21 0043 09/27/21 0453 09/28/21 0539  WBC  --  6.6 5.4 6.9 7.6  HGB 13.3 10.8* 9.7* 10.0* 9.3*  HCT 39.0 34.9* 31.4* 32.2* 30.1*  MCV  --  95.6 95.2 95.0 94.7  PLT  --  208 177 175 171    Cardiac Enzymes: No results for input(s): CKTOTAL, CKMB, CKMBINDEX, TROPONINI in the last 168 hours.  BNP: Invalid input(s): POCBNP  CBG: Recent Labs  Lab 09/23/21 0810  GLUCAP 76    Microbiology:  Results for orders placed or performed during the hospital encounter of 09/23/21  Resp Panel by RT-PCR (Flu A&B, Covid) Nasopharyngeal Swab     Status: None   Collection Time: 09/23/21  4:21 PM   Specimen: Nasopharyngeal Swab; Nasopharyngeal(NP) swabs in vial transport medium  Result Value Ref Range Status   SARS Coronavirus 2 by RT PCR NEGATIVE NEGATIVE Final    Comment: (NOTE) SARS-CoV-2 target nucleic acids are NOT DETECTED.  The SARS-CoV-2 RNA is generally detectable in upper respiratory specimens during the acute phase of infection. The lowest concentration of SARS-CoV-2 viral copies this assay can detect is 138 copies/mL. A negative result does not preclude SARS-Cov-2 infection and should not be used as the sole basis for treatment  or other patient management decisions. A negative result may occur with  improper specimen collection/handling, submission of specimen other than nasopharyngeal swab, presence of viral mutation(s) within the areas targeted by this assay, and inadequate number of viral copies(<138 copies/mL). A negative result must be combined with clinical observations, patient history, and epidemiological information. The expected result is Negative.  Fact Sheet for Patients:  BloggerCourse.com  Fact Sheet for Healthcare Providers:  SeriousBroker.it  This test is no t yet approved or cleared by the [place] FDA and  has been authorized for detection and/or diagnosis of SARS-CoV-2 by FDA under an Emergency Use Authorization (EUA). This EUA will remain  in effect (meaning this test can be used) for the duration of the COVID-19 declaration under Section 564(b)(1) of the Act, 21 U.S.C.section 360bbb-3(b)(1), unless the authorization is terminated  or revoked sooner.       Influenza A by PCR NEGATIVE NEGATIVE Final   Influenza B by PCR NEGATIVE NEGATIVE Final    Comment: (NOTE) The Xpert Xpress SARS-CoV-2/FLU/RSV plus assay is intended as an aid in the diagnosis of influenza from Nasopharyngeal swab specimens and should not be used as a sole basis for treatment. Nasal washings and aspirates are unacceptable for Xpert Xpress SARS-CoV-2/FLU/RSV testing.  Fact Sheet for Patients: BloggerCourse.com  Fact Sheet for Healthcare Providers: SeriousBroker.it  This test is not yet approved or cleared by the [place] FDA and has been authorized for detection and/or diagnosis of SARS-CoV-2 by FDA under an Emergency Use Authorization (EUA). This EUA will remain in effect (meaning this test can be used) for the duration of the COVID-19 declaration under Section 564(b)(1) of the Act, 21 U.S.C. section  360bbb-3(b)(1), unless the authorization is terminated or revoked.  Performed at [facility], [address] [place], [place] [address]   MRSA Next Gen by PCR, Nasal     Status: None   Collection Time: 09/28/21  9:58 PM   Specimen: Nasal Mucosa; Nasal Swab  Result Value Ref Range Status   MRSA by PCR Next Gen NOT DETECTED NOT DETECTED Final    Comment: (NOTE) The GeneXpert MRSA Assay (FDA approved for NASAL specimens only), is one component of a comprehensive MRSA colonization surveillance program. It is not intended to diagnose MRSA infection nor to guide or monitor treatment for MRSA infections. Test performance is not FDA approved in patients less than 47 years old. Performed at [facility], [address] [place], [place] [address]     Coagulation Studies: No results for input(s): LABPROT, INR in the last 72 hours.  Urinalysis: No results for input(s): COLORURINE, LABSPEC, PHURINE, GLUCOSEU, HGBUR, BILIRUBINUR, KETONESUR, PROTEINUR, UROBILINOGEN, NITRITE, LEUKOCYTESUR in the last 72 hours.  Invalid input(s): APPERANCEUR     Imaging: [place]  OR NERVE BLOCK-IMAGE ONLY [facility])  Result Date: 09/27/2021 There is no interpretation for this exam.  This order is for images obtained during a surgical procedure.  Please See "Surgeries" Tab for more information regarding the procedure.     Medications:        amLODipine  5 mg Oral QPM   aspirin EC  81 mg Oral Daily   atorvastatin  40 mg Oral Daily   calcium acetate  1,334 mg Oral TID WC   carvedilol  12.5 mg Oral BID   Chlorhexidine Gluconate Cloth  6 each Topical Daily   epoetin (EPOGEN/PROCRIT) injection  4,000 Units Intravenous Q M,W,F-HD   famotidine  20 mg Oral Once   feeding supplement  237 mL Oral BID BM   heparin  5,000 Units Subcutaneous Q8H   irbesartan  150 mg Oral QPM   multivitamin  1 tablet Oral QHS   nicotine  21 mg Transdermal Q24H     Assessment/ Plan:    Mr. Geovonnie Koonz is a  51 y.o.  male with past medical history including pulmonary hypertension, combined CHF, CVA, hypertension, neurogenic bladder with right nephrostomy tube, and end-stage renal disease on hemodialysis.    [facility]/MWF/Rt Permcath  End stage renal disease requiring hemodialysis.             AV graft placement postponed .           Patient was dialyzed yesterday-Wednesday .           No need for renal placement therapy today           We will dialyze patient tomorrow   2. Anemia of chronic kidney disease Macrocytic Lab Results  Component Value Date   HGB 9.3 (L) 09/28/2021    Recieves mircera as outpt.   Epogen with hemodialysis treatments  3. Secondary Hyperparathyroidism:  Lab Results  Component Value Date   CALCIUM 7.5 (L) 09/28/2021   CAION 0.79 (LL) 09/23/2021   PHOS 4.6 09/27/2021    Calcium and phosphorus now within acceptable range.   We will continue to monitor.  On  calcium acetate with meals We will follow up on phosphorus  4. Hypertension with chronic kidney disease. Home regimen includes carvedilol and irbesartan.    Blood pressure better controlled after adding amlodipine to his regimen.   5. Access  patient was to have AV graft surgery.  It has been  been canceled secondary to multiple issues such as shortness of breath and later patient having cocaine positive on his drug screen   6. cocaine abuse- educated patient to stay away from illegal drugs    LOS: 6 Ritu Gagliardo s [facility] 5/11/20239:38 AM

## 2021-09-29 NOTE — Plan of Care (Signed)
  Problem: Education: Goal: Knowledge of disease or condition will improve Outcome: Not Progressing   

## 2021-09-29 NOTE — Progress Notes (Signed)
? ? ?PROGRESS NOTE ? ? ? ?Larry Morrison  NFA:[id number] DOB: 16-Dec-1970  ?DOA: 09/23/2021 ?Date of Service: Today @TODAY @ which is Hospital Day 6 ?PCP: [facility] [place] ? ? ? ? ?Brief Narrative & Hospital COurse:  ? ?Larry Morrison is a 51 y.o. male with medical history significant of ESRD-HD (MWF), hypertension, stroke, cocaine abuse, tobacco abuse, pulmonary hypertension, CHF with EF of<20%, anemia, cardiac arrest, who presented to the ED on 09/23/2021 with progressively worsening with shortness of breath over several days.  He had presented for outpatient vascular procedure that was canceled due to his shortness of breath.  He was seen in ED on 5/3 due to shortness of breath, and was thought to be due to missing 2 of his dialysis.  Patient was dialyzed the day before presenting. ED Course: WBC 6.6, BNP> 4500, UDS positive for cocaine, temperature normal, blood pressure 191/80, heart rate 94, RR 16, oxygen saturation 91-100% on room air.  X-ray showed stable bilateral pleural effusions, left greater than right, with associated atelectasis.  ?  ?Of note, patient has previously had resection of the right brachial axillary AV graft.  As of admission he had no permanent extremity dialysis access.  He had been maintained via PermCath which has been working but his dialysis runs have not been ideal. Patient is scheduled for creation of a left brachial axillary AV graft by Dr. Delana Meyer of vascular surgery, but this was canceled due to shortness of breath, later canclled inpatient d/t persistence of cocaine metabolites on UDS.  ?  ? ? ?Consultants:  ?Nephrology - maintain ESRD dialysis  ?Vascular Surgery - need graft / permanent dialysis access  ?Cardiology - elevated troponins d/t demand ischemia ? ?Procedures: ?Dialysis routine ? ? ? ?Subjective: ?Patient seen and examined this morning resting comfortably in hospital bed, no distress, no complaints. ? ? ? ? ? ? ?ASSESSMENT & PLAN: ?  ?Principal Problem: ?  SOB (shortness  of breath) ?Active Problems: ?  Tobacco use disorder ?  Essential hypertension ?  Anemia in ESRD (end-stage renal disease) ([place]) ?  Elevated troponin ?  Malnutrition of moderate degree ?  ESRD on hemodialysis ([place]) ?  Cocaine use disorder ([place]) ?  History of ischemic stroke ?  Chronic combined systolic and diastolic CHF (congestive heart failure) ([place]) ? ? ?SOB (shortness of breath) ?Resolved now ?Likely multifactorial etiology, including ESRD, sCHF, bilateral pleural effusion.  Patient has elevated troponin level 157 --> 268, but no chest pain, which is likely due to demand ischemia.  Dr. Holley Raring of renal was consulted for dialysis ?Bronchodilators ?Dialysis per renal ? ?ESRD on hemodialysis ([place]) ?Nephrology following for dialysis. ?Vascular surgery planning to create new AV graft - on schedule for 09/30/21.   ?NPO after midnight on date of planned procedure. ? ?Chronic combined systolic and diastolic CHF (congestive heart failure) ([place]) ?Due to dilated cardiomyopathy.  2D echo 04/12/2021 showed EF<20, BNP > 4500.  Patient may have mild fluid overload. ?Volume management per renal by dialysis ?Cardiology consulted, No plans for ischemic evaluation, patient has reportedly declined this ?Absolute cessation from cocaine and illicit drugs ? ?Tobacco use disorder ?Nicotine patch ? ?Anemia in ESRD (end-stage renal disease) ([place]) ?Hemoglobin stable. ?Monitor CBC ? ?Elevated troponin ?Troponin level 157 --> 268, no chest pain likely due to demand ischemia and poor clearance in the setting of ESRD.   ?Cardiology consulted, started on aspirin and Lipitor ? ? ?Malnutrition of moderate degree ?continue Ensure ? ?Cocaine use disorder ([place]) ?Cocaine positive on UDS  in the ED and on repeated UDS prior to graft procedure - cancelled procedure  ?Amlodipine for hypertension ? ? ?History of ischemic stroke ? aspirin and Lipitor ? ?Essential hypertension ?Continue amlodipine given cocaine abuse. ?Volume removal by dialysis ? ? ? ?DVT  prophylaxis: heparin ?Code Status: FULL ?Family Communication: none at this time ?Disposition Plan: discharge home pending clearance from vascular surgery, concern for illicit drug abstinence at home, planning for AV graft inpatient tomorrow (09/30/21)  ? ? ? ? ? ?Objective: ?Vitals:  ? 09/28/21 2315 09/29/21 0400 09/29/21 0407 09/29/21 0813  ?BP: 101/60 108/67  115/71  ?Pulse: 75 76  75  ?Resp: 18 19  18   ?Temp:  97.9 ?F (36.6 ?C)  98 ?F (36.7 ?C)  ?TempSrc:  Oral  Oral  ?SpO2: 95% 99%  98%  ?Weight:   45 kg   ?Height:      ? ? ?Intake/Output Summary (Last 24 hours) at 09/29/2021 1446 ?Last data filed at 09/29/2021 1300 ?Gross per 24 hour  ?Intake 360 ml  ?Output 50 ml  ?Net 310 ml  ? ? ?Filed Weights  ? 09/28/21 0913 09/28/21 1248 09/29/21 0407  ?Weight: 44.4 kg 43.6 kg 45 kg  ? ? ?Examination: ? ?General exam: Appears calm and comfortable  ?Respiratory system: Clear to auscultation. Respiratory effort normal. ?Cardiovascular system: S1 & S2 , RRR.  ?Gastrointestinal system: Abdomen is nondistended, soft and nontender. ?Central nervous system: Alert  ? ? ? ? ? ? ?Scheduled Medications:  ? amLODipine  5 mg Oral QPM  ? aspirin EC  81 mg Oral Daily  ? atorvastatin  40 mg Oral Daily  ? calcium acetate  1,334 mg Oral TID WC  ? carvedilol  12.5 mg Oral BID  ? Chlorhexidine Gluconate Cloth  6 each Topical Daily  ? epoetin (EPOGEN/PROCRIT) injection  4,000 Units Intravenous Q M,W,F-HD  ? feeding supplement  237 mL Oral BID BM  ? heparin  5,000 Units Subcutaneous Q8H  ? irbesartan  150 mg Oral QPM  ? multivitamin  1 tablet Oral QHS  ? nicotine  21 mg Transdermal Q24H  ? ? ?Continuous Infusions: ? ? ?PRN Medications:  ?acetaminophen, albuterol, dextromethorphan-guaiFENesin, hydrALAZINE, ipratropium-albuterol ? ?Antimicrobials:  ?Anti-infectives (From admission, onward)  ? ? Start     Dose/Rate Route Frequency Ordered Stop  ? 09/27/21 0600  ceFAZolin (ANCEF) IVPB 2g/100 mL premix  Status:  Discontinued       ? 2 g ?200 mL/hr  over 30 Minutes Intravenous On call to O.R. 09/27/21 0146 09/27/21 1634  ? 09/23/21 0809  ceFAZolin (ANCEF) 2-4 GM/100ML-% IVPB       ?Note to Pharmacy: [place] Calix, [place]: cabinet override  ?    09/23/21 0809 09/23/21 2014  ? 09/23/21 0600  ceFAZolin (ANCEF) IVPB 2g/100 mL premix  Status:  Discontinued       ? 2 g ?200 mL/hr over 30 Minutes Intravenous On call to O.R. 09/23/21 0319 09/23/21 1358  ? ?  ? ? ?Data Reviewed: I have personally reviewed following labs and imaging studies ? ?CBC: ?Recent Labs  ?Lab 09/23/21 ?7829 09/23/21 ?5621 09/24/21 ?3086 09/27/21 ?5784 09/28/21 ?6962  ?WBC  --  6.6 5.4 6.9 7.6  ?HGB 13.3 10.8* 9.7* 10.0* 9.3*  ?HCT 39.0 34.9* 31.4* 32.2* 30.1*  ?MCV  --  95.6 95.2 95.0 94.7  ?PLT  --  208 177 175 171  ? ? ?Basic Metabolic Panel: ?Recent Labs  ?Lab 09/23/21 ?9528 09/24/21 ?4132 09/25/21 ?0425 09/27/21 ?4401 09/28/21 ?0272  ?  NA 142 138 141 139 139  ?K 4.1 3.5 3.8 4.0 3.9  ?CL 107 99 104 101 105  ?CO2  --  28 25 26 26   ?GLUCOSE 109* 85 103* 86 121*  ?BUN 59* 35* 54* 32* 48*  ?CREATININE 9.60* 4.81* 7.41* 5.32* 7.02*  ?CALCIUM  --  7.7* 7.5* 7.8* 7.5*  ?MG  --  1.6* 2.0  --   --   ?PHOS  --   --   --  4.6  --   ? ? ?GFR: ?Estimated Creatinine Clearance: 7.7 mL/min (A) (by C-G formula based on SCr of 7.02 mg/dL (H)). ?Liver Function Tests: ?Recent Labs  ?Lab 09/27/21 ?4496  ?ALBUMIN 2.3*  ? ? ?No results for input(s): LIPASE, AMYLASE in the last 168 hours. ?No results for input(s): AMMONIA in the last 168 hours. ?Coagulation Profile: ?No results for input(s): INR, PROTIME in the last 168 hours. ?Cardiac Enzymes: ?No results for input(s): CKTOTAL, CKMB, CKMBINDEX, TROPONINI in the last 168 hours. ?BNP (last 3 results) ?No results for input(s): PROBNP in the last 8760 hours. ?HbA1C: ?No results for input(s): HGBA1C in the last 72 hours. ?CBG: ?Recent Labs  ?Lab 09/23/21 ?0810  ?GLUCAP 76  ? ? ?Lipid Profile: ?No results for input(s): CHOL, HDL, LDLCALC, TRIG, CHOLHDL, LDLDIRECT in the  last 72 hours. ?Thyroid Function Tests: ?No results for input(s): TSH, T4TOTAL, FREET4, T3FREE, THYROIDAB in the last 72 hours. ?Anemia Panel: ?No results for input(s): VITAMINB12, FOLATE, FERRITIN, TIBC,

## 2021-09-30 ENCOUNTER — Encounter
Admission: RE | Disposition: A | Discharge status: Home / Self Care | Payer: Self-pay | Source: Home / Self Care | Attending: Internal Medicine

## 2021-09-30 ENCOUNTER — Inpatient Hospital Stay: Payer: Medicaid Other | Admitting: Anesthesiology

## 2021-09-30 ENCOUNTER — Encounter: Payer: Self-pay | Admitting: Internal Medicine

## 2021-09-30 ENCOUNTER — Other Ambulatory Visit: Payer: Self-pay

## 2021-09-30 ENCOUNTER — Inpatient Hospital Stay: Payer: Medicaid Other

## 2021-09-30 DIAGNOSIS — N186 End stage renal disease: Secondary | ICD-10-CM | POA: Diagnosis not present

## 2021-09-30 DIAGNOSIS — R0602 Shortness of breath: Secondary | ICD-10-CM | POA: Diagnosis not present

## 2021-09-30 HISTORY — PX: AV FISTULA PLACEMENT: SHX1204

## 2021-09-30 LAB — BASIC METABOLIC PANEL
Anion gap: 10 (ref 5–15)
BUN: 43 mg/dL — ABNORMAL HIGH (ref 6–20)
CO2: 26 mmol/L (ref 22–32)
Calcium: 7.9 mg/dL — ABNORMAL LOW (ref 8.9–10.3)
Chloride: 105 mmol/L (ref 98–111)
Creatinine, Ser: 7.55 mg/dL — ABNORMAL HIGH (ref 0.61–1.24)
GFR, Estimated: 8 mL/min — ABNORMAL LOW (ref >60)
Glucose, Bld: 85 mg/dL (ref 70–99)
Potassium: 3.9 mmol/L (ref 3.5–5.1)
Sodium: 141 mmol/L (ref 135–145)

## 2021-09-30 LAB — CBC
HCT: 31 % — ABNORMAL LOW (ref 39.0–52.0)
Hemoglobin: 9.6 g/dL — ABNORMAL LOW (ref 13.0–17.0)
MCH: 29.4 pg (ref 26.0–34.0)
MCHC: 31 g/dL (ref 30.0–36.0)
MCV: 95.1 fL (ref 80.0–100.0)
Platelets: 185 10*3/uL (ref 150–400)
RBC: 3.26 MIL/uL — ABNORMAL LOW (ref 4.22–5.81)
RDW: 15.8 % — ABNORMAL HIGH (ref 11.5–15.5)
WBC: 6.6 10*3/uL (ref 4.0–10.5)
nRBC: 0 % (ref 0.0–0.2)

## 2021-09-30 SURGERY — INSERTION OF ARTERIOVENOUS (AV) GORE-TEX GRAFT ARM
Anesthesia: Regional | Laterality: Left

## 2021-09-30 MED ORDER — IPRATROPIUM-ALBUTEROL 0.5-2.5 (3) MG/3ML IN SOLN
RESPIRATORY_TRACT | Status: AC
  Administered 2021-09-30: 3 mL
  Filled 2021-09-30: qty 3

## 2021-09-30 MED ORDER — BUPIVACAINE HCL (PF) 0.5 % IJ SOLN
INTRAMUSCULAR | Status: AC
  Filled 2021-09-30: qty 20

## 2021-09-30 MED ORDER — ONDANSETRON HCL 4 MG/2ML IJ SOLN
INTRAMUSCULAR | Status: AC
  Filled 2021-09-30: qty 2

## 2021-09-30 MED ORDER — FENTANYL CITRATE (PF) 100 MCG/2ML IJ SOLN
INTRAMUSCULAR | Status: DC | PRN
Start: 2021-09-30 — End: 2021-09-30
  Administered 2021-09-30: 50 ug via INTRAVENOUS
  Administered 2021-09-30 (×2): 25 ug via INTRAVENOUS

## 2021-09-30 MED ORDER — OXYCODONE-ACETAMINOPHEN 5-325 MG PO TABS
1.0000 | ORAL_TABLET | Freq: Four times a day (QID) | ORAL | Status: DC | PRN
  Administered 2021-09-30 – 2021-10-01 (×2): 1 via ORAL
  Filled 2021-09-30 (×2): qty 1

## 2021-09-30 MED ORDER — MORPHINE SULFATE (PF) 2 MG/ML IV SOLN: 2.0000 mg | INTRAVENOUS | Status: DC | PRN

## 2021-09-30 MED ORDER — PROPOFOL 500 MG/50ML IV EMUL
INTRAVENOUS | Status: DC | PRN
  Administered 2021-09-30: 25 ug/kg/min via INTRAVENOUS

## 2021-09-30 MED ORDER — CEFAZOLIN SODIUM-DEXTROSE 1-4 GM/50ML-% IV SOLN
INTRAVENOUS | Status: AC
  Filled 2021-09-30: qty 50

## 2021-09-30 MED ORDER — LIDOCAINE HCL (PF) 2 % IJ SOLN
INTRAMUSCULAR | Status: AC
  Filled 2021-09-30: qty 5

## 2021-09-30 MED ORDER — PAPAVERINE HCL 30 MG/ML IJ SOLN
INTRAMUSCULAR | Status: AC
  Filled 2021-09-30: qty 2

## 2021-09-30 MED ORDER — BUPIVACAINE HCL (PF) 0.5 % IJ SOLN
INTRAMUSCULAR | Status: AC
Start: 2021-09-30 — End: ?
  Filled 2021-09-30: qty 30

## 2021-09-30 MED ORDER — HYDROMORPHONE HCL 1 MG/ML IJ SOLN: 1.0000 mg | Freq: Once | INTRAMUSCULAR | Status: DC | PRN

## 2021-09-30 MED ORDER — ONDANSETRON HCL 4 MG/2ML IJ SOLN
INTRAMUSCULAR | Status: DC | PRN
  Administered 2021-09-30: 4 mg via INTRAVENOUS

## 2021-09-30 MED ORDER — BUPIVACAINE LIPOSOME 1.3 % IJ SUSP
INTRAMUSCULAR | Status: AC
  Filled 2021-09-30: qty 20

## 2021-09-30 MED ORDER — HEPARIN SODIUM (PORCINE) 5000 UNIT/ML IJ SOLN
INTRAMUSCULAR | Status: AC
  Filled 2021-09-30: qty 1

## 2021-09-30 MED ORDER — GLYCOPYRROLATE 0.2 MG/ML IJ SOLN
INTRAMUSCULAR | Status: AC
  Filled 2021-09-30: qty 1

## 2021-09-30 MED ORDER — CEFAZOLIN SODIUM-DEXTROSE 1-4 GM/50ML-% IV SOLN
1.0000 g | Freq: Once | INTRAVENOUS | Status: AC
  Administered 2021-09-30: 1 g via INTRAVENOUS

## 2021-09-30 MED ORDER — MIDAZOLAM HCL 2 MG/2ML IJ SOLN
INTRAMUSCULAR | Status: AC
Start: 2021-09-30 — End: ?
  Filled 2021-09-30: qty 2

## 2021-09-30 MED ORDER — PROPOFOL 500 MG/50ML IV EMUL
INTRAVENOUS | Status: AC
  Filled 2021-09-30: qty 50

## 2021-09-30 MED ORDER — GLYCOPYRROLATE 0.2 MG/ML IJ SOLN
INTRAMUSCULAR | Status: DC | PRN
  Administered 2021-09-30: .2 mg via INTRAVENOUS

## 2021-09-30 MED ORDER — MIDAZOLAM HCL 5 MG/5ML IJ SOLN
INTRAMUSCULAR | Status: DC | PRN
  Administered 2021-09-30: .5 mg via INTRAVENOUS
  Administered 2021-09-30: 1 mg via INTRAVENOUS
  Administered 2021-09-30: .5 mg via INTRAVENOUS

## 2021-09-30 MED ORDER — IPRATROPIUM-ALBUTEROL 0.5-2.5 (3) MG/3ML IN SOLN
3.0000 mL | RESPIRATORY_TRACT | Status: DC
  Filled 2021-09-30 (×2): qty 3

## 2021-09-30 MED ORDER — SODIUM CHLORIDE 0.9 % IV SOLN: INTRAVENOUS | Status: DC | PRN

## 2021-09-30 MED ORDER — MIDAZOLAM HCL 2 MG/2ML IJ SOLN: 1.0000 mg | Freq: Once | INTRAMUSCULAR | Status: AC

## 2021-09-30 MED ORDER — FENTANYL CITRATE (PF) 100 MCG/2ML IJ SOLN
INTRAMUSCULAR | Status: AC
  Filled 2021-09-30: qty 2

## 2021-09-30 MED ORDER — MIDAZOLAM HCL 2 MG/2ML IJ SOLN
INTRAMUSCULAR | Status: AC
  Administered 2021-09-30: 1 mg via INTRAVENOUS
  Filled 2021-09-30: qty 2

## 2021-09-30 MED ORDER — BUPIVACAINE HCL (PF) 0.5 % IJ SOLN
INTRAMUSCULAR | Status: DC | PRN
  Administered 2021-09-30 (×2): 10 mL via PERINEURAL

## 2021-09-30 SURGICAL SUPPLY — 57 items
APPLIER CLIP 11 MED OPEN (CLIP)
APPLIER CLIP 9.375 SM OPEN (CLIP)
BAG DECANTER FOR FLEXI CONT (MISCELLANEOUS) ×2 IMPLANT
BLADE SURG SZ11 CARB STEEL (BLADE) ×2 IMPLANT
BOOT SUTURE AID YELLOW STND (SUTURE) ×2 IMPLANT
BRUSH SCRUB EZ  4% CHG (MISCELLANEOUS) ×1
BRUSH SCRUB EZ 4% CHG (MISCELLANEOUS) ×1 IMPLANT
CHLORAPREP W/TINT 26 (MISCELLANEOUS) ×2 IMPLANT
CLIP APPLIE 11 MED OPEN (CLIP) IMPLANT
CLIP APPLIE 9.375 SM OPEN (CLIP) IMPLANT
DERMABOND ADVANCED (GAUZE/BANDAGES/DRESSINGS) ×1
DERMABOND ADVANCED .7 DNX12 (GAUZE/BANDAGES/DRESSINGS) ×1 IMPLANT
DRESSING SURGICEL FIBRLLR 1X2 (HEMOSTASIS) ×1 IMPLANT
DRSG SURGICEL FIBRILLAR 1X2 (HEMOSTASIS) ×2
ELECT CAUTERY BLADE 6.4 (BLADE) ×2 IMPLANT
ELECT REM PT RETURN 9FT ADLT (ELECTROSURGICAL) ×2
ELECTRODE REM PT RTRN 9FT ADLT (ELECTROSURGICAL) ×1 IMPLANT
GLOVE SURG SYN 8.0 (GLOVE) ×2 IMPLANT
GLOVE SURG SYN 8.0 PF PI (GLOVE) ×1 IMPLANT
GOWN STRL REUS W/ TWL LRG LVL3 (GOWN DISPOSABLE) ×1 IMPLANT
GOWN STRL REUS W/ TWL XL LVL3 (GOWN DISPOSABLE) ×1 IMPLANT
GOWN STRL REUS W/TWL LRG LVL3 (GOWN DISPOSABLE) ×1
GOWN STRL REUS W/TWL XL LVL3 (GOWN DISPOSABLE) ×1
GRAFT PROPATEN STD WALL 4 7X45 (Vascular Products) ×2 IMPLANT
IV NS 500ML (IV SOLUTION) ×1
IV NS 500ML BAXH (IV SOLUTION) ×1 IMPLANT
KIT TURNOVER KIT A (KITS) ×2 IMPLANT
LABEL OR SOLS (LABEL) ×2 IMPLANT
LOOP RED MAXI  1X406MM (MISCELLANEOUS) ×1
LOOP VESSEL MAXI 1X406 RED (MISCELLANEOUS) ×1 IMPLANT
LOOP VESSEL MINI 0.8X406 BLUE (MISCELLANEOUS) ×2 IMPLANT
LOOPS BLUE MINI 0.8X406MM (MISCELLANEOUS) ×2
MANIFOLD NEPTUNE II (INSTRUMENTS) ×2 IMPLANT
NDL FILTER BLUNT 18X1 1/2 (NEEDLE) ×1 IMPLANT
NEEDLE FILTER BLUNT 18X 1/2SAF (NEEDLE) ×1
NEEDLE FILTER BLUNT 18X1 1/2 (NEEDLE) ×1 IMPLANT
NS IRRIG 500ML POUR BTL (IV SOLUTION) ×2 IMPLANT
PACK EXTREMITY ARMC (MISCELLANEOUS) ×2 IMPLANT
PAD PREP 24X41 OB/GYN DISP (PERSONAL CARE ITEMS) ×2 IMPLANT
STOCKINETTE 48X4 2 PLY STRL (GAUZE/BANDAGES/DRESSINGS) ×1 IMPLANT
STOCKINETTE STRL 4IN 9604848 (GAUZE/BANDAGES/DRESSINGS) ×2 IMPLANT
SUT GTX CV-6 30 (SUTURE) ×4 IMPLANT
SUT MNCRL+ 5-0 UNDYED PC-3 (SUTURE) ×1 IMPLANT
SUT MONOCRYL 5-0 (SUTURE) ×1
SUT PROLENE 6 0 BV (SUTURE) ×4 IMPLANT
SUT SILK 2 0 (SUTURE) ×1
SUT SILK 2 0 SH (SUTURE) ×2 IMPLANT
SUT SILK 2-0 18XBRD TIE 12 (SUTURE) ×1 IMPLANT
SUT SILK 3 0 (SUTURE) ×1
SUT SILK 3-0 18XBRD TIE 12 (SUTURE) ×1 IMPLANT
SUT SILK 4 0 (SUTURE) ×1
SUT SILK 4-0 18XBRD TIE 12 (SUTURE) ×1 IMPLANT
SUT VIC AB 3-0 SH 27 (SUTURE) ×2
SUT VIC AB 3-0 SH 27X BRD (SUTURE) ×2 IMPLANT
SYR 20ML LL LF (SYRINGE) ×2 IMPLANT
SYR 3ML LL SCALE MARK (SYRINGE) ×2 IMPLANT
WATER STERILE IRR 500ML POUR (IV SOLUTION) ×2 IMPLANT

## 2021-09-30 NOTE — Transfer of Care (Signed)
Immediate Anesthesia Transfer of Care Note ? ?Patient: Larry Morrison ? ?Procedure(s) Performed: INSERTION OF ARTERIOVENOUS (AV) GORE-TEX GRAFT ARM BRACHIAL AXILLARY (Left) ? ?Patient Location: PACU ? ?Anesthesia Type:MAC combined with regional for post-op pain ? ?Level of Consciousness: sedated ? ?Airway & Oxygen Therapy: Patient Spontanous Breathing and Patient connected to nasal cannula oxygen ? ?Post-op Assessment: Report given to RN and Post -op Vital signs reviewed and stable ? ?Post vital signs: Reviewed ? ?Last Vitals:  ?Vitals Value Taken Time  ?BP    ?Temp    ?Pulse    ?Resp 22 09/30/21 1021  ?SpO2    ?Vitals shown include unvalidated device data. ? ?Last Pain:  ?Vitals:  ? 09/30/21 0716  ?TempSrc: Tympanic  ?PainSc: 0-No pain  ?   ? ?Patients Stated Pain Goal: 0 (09/30/21 0716) ? ?Complications: No notable events documented. ?

## 2021-09-30 NOTE — Progress Notes (Signed)
? ? ?PROGRESS NOTE ? ? ? ?Larry Morrison  ERD:[id number] DOB: 01/03/1971  ?DOA: 09/23/2021 ?Date of Service: Today @TODAY @ which is Hospital Day 7 ?PCP: [facility], [place] ? ? ? ? ?Brief Narrative & Hospital COurse:  ? ?Larry Morrison is a 51 y.o. male with medical history significant of ESRD-HD (MWF), hypertension, stroke, cocaine abuse, tobacco abuse, pulmonary hypertension, CHF with EF of<20%, anemia, cardiac arrest, who presented to the ED on 09/23/2021 with progressively worsening with shortness of breath over several days.  He had presented for outpatient vascular procedure that was canceled due to his shortness of breath.  He was seen in ED on 5/3 due to shortness of breath, and was thought to be due to missing 2 of his dialysis.  Patient was dialyzed the day before presenting. ED Course: WBC 6.6, BNP> 4500, UDS positive for cocaine, temperature normal, blood pressure 191/80, heart rate 94, RR 16, oxygen saturation 91-100% on room air.  X-ray showed stable bilateral pleural effusions, left greater than right, with associated atelectasis.  ?  ?Of note, patient has previously had resection of the right brachial axillary AV graft.  As of admission he had no permanent extremity dialysis access.  He had been maintained via PermCath which has been working but his dialysis runs have not been ideal. Patient is scheduled for creation of a left brachial axillary AV graft by Dr. Delana Meyer of vascular surgery, but this was canceled due to shortness of breath, later canclled inpatient d/t persistence of cocaine metabolites on UDS. AV graft done 05/12, pt declining hemodialysis. Plan for HD tomorrow and if stable he will be appropriate fir discharge home ?  ? ? ?Consultants:  ?Nephrology - maintain ESRD dialysis  ?Vascular Surgery - need graft / permanent dialysis access  ?Cardiology - elevated troponins d/t demand ischemia ? ?Procedures: ?Dialysis routine ? ? ? ?Subjective: ?Patient seen and examined this morning resting  comfortably in hospital bed, no distress, no complaints other than feels tired after AV graft placement and this is why he reports refusing HD today.  ? ? ? ? ? ? ?ASSESSMENT & PLAN: ?  ?Principal Problem: ?  SOB (shortness of breath) ?Active Problems: ?  ESRD on hemodialysis ([place]) ?  Chronic combined systolic and diastolic CHF (congestive heart failure) ([place]) ?  Tobacco use disorder ?  Essential hypertension ?  Anemia in ESRD (end-stage renal disease) ([place]) ?  Elevated troponin ?  Malnutrition of moderate degree ?  Cocaine use disorder ([place]) ?  History of ischemic stroke ? ? ?SOB (shortness of breath) ?Resolved now ?Likely multifactorial etiology, including ESRD, sCHF, bilateral pleural effusion.  Patient has elevated troponin level 157 --> 268, but no chest pain, which is likely due to demand ischemia.  Dr. Holley Raring of renal was consulted for dialysis ?Bronchodilators ?Dialysis per renal ? ?ESRD on hemodialysis ([place]) ?Nephrology following for dialysis. ?Vascular surgery AV graft - done today 09/30/21.   ?Declined HD today ?HD tomorrow and if stable he will be appropriate for d/c home ? ?Chronic combined systolic and diastolic CHF (congestive heart failure) ([place]) ?Due to dilated cardiomyopathy.  2D echo 04/12/2021 showed EF<20, BNP > 4500.  Patient may have mild fluid overload. ?Volume management per renal by dialysis ?Cardiology consulted, No plans for ischemic evaluation, patient has reportedly declined this ?Absolute cessation from cocaine and illicit drugs ? ?Tobacco use disorder ?Nicotine patch ? ?Anemia in ESRD (end-stage renal disease) ([place]) ?Hemoglobin stable. ?Monitor CBC ? ?Elevated troponin ?Troponin level 157 --> 268, no chest  pain likely due to demand ischemia and poor clearance in the setting of ESRD.   ?Cardiology consulted, started on aspirin and Lipitor ? ? ?Malnutrition of moderate degree ?continue Ensure ? ?Cocaine use disorder ([place]) ?Cocaine positive on UDS in the ED and on repeated UDS prior to  graft procedure - cancelled procedure  ?Amlodipine for hypertension ? ? ?History of ischemic stroke ? aspirin and Lipitor ? ?Essential hypertension ?Continue amlodipine given cocaine abuse. ?Volume removal by dialysis ? ? ? ?DVT prophylaxis: heparin ?Code Status: FULL ?Family Communication: none at this time ?Disposition Plan: discharge home hopefully tomorrow after dialysis ? ? ? ? ? ?Objective: ?Vitals:  ? 09/30/21 1045 09/30/21 1100 09/30/21 1237 09/30/21 1601  ?BP: 97/65 97/76 112/70 105/65  ?Pulse: 74 79 76 79  ?Resp: 19 20 18 19   ?Temp:  98.8 ?F (37.1 ?C) 98.4 ?F (36.9 ?C) 98.3 ?F (36.8 ?C)  ?TempSrc:    Oral  ?SpO2: 100% 95% 100% 94%  ?Weight:      ?Height:      ? ? ?Intake/Output Summary (Last 24 hours) at 09/30/2021 1820 ?Last data filed at 09/30/2021 1700 ?Gross per 24 hour  ?Intake 900 ml  ?Output 175 ml  ?Net 725 ml  ? ?Filed Weights  ? 09/29/21 0407 09/30/21 0500 09/30/21 0716  ?Weight: 45 kg 45.8 kg 45.8 kg  ? ? ?Examination: ? ?General exam: Appears calm and comfortable  ?Respiratory system: Clear to auscultation. Respiratory effort normal. ?Cardiovascular system: S1 & S2 , RRR.  ?Gastrointestinal system: Abdomen is nondistended, soft and nontender. ?Central nervous system: Alert  ? ? ? ? ? ? ?Scheduled Medications:  ? amLODipine  5 mg Oral QPM  ? aspirin EC  81 mg Oral Daily  ? atorvastatin  40 mg Oral Daily  ? calcium acetate  1,334 mg Oral TID WC  ? carvedilol  12.5 mg Oral BID  ? Chlorhexidine Gluconate Cloth  6 each Topical Daily  ? epoetin (EPOGEN/PROCRIT) injection  4,000 Units Intravenous Q M,W,F-HD  ? feeding supplement  237 mL Oral BID BM  ? heparin  5,000 Units Subcutaneous Q8H  ? irbesartan  150 mg Oral QPM  ? multivitamin  1 tablet Oral QHS  ? nicotine  21 mg Transdermal Q24H  ? ? ?Continuous Infusions: ? ? ?PRN Medications:  ?acetaminophen, albuterol, dextromethorphan-guaiFENesin, hydrALAZINE, HYDROmorphone (DILAUDID) injection, ipratropium-albuterol, morphine injection,  oxyCODONE-acetaminophen ? ?Antimicrobials:  ?Anti-infectives (From admission, onward)  ? ? Start     Dose/Rate Route Frequency Ordered Stop  ? 09/30/21 0745  ceFAZolin (ANCEF) IVPB 1 g/50 mL premix       ? 1 g ?100 mL/hr over 30 Minutes Intravenous  Once 09/30/21 0730 09/30/21 0841  ? 09/30/21 0728  ceFAZolin (ANCEF) 1-4 GM/50ML-% IVPB       ?Note to Pharmacy: Register, [place] Glad A: cabinet override  ?    09/30/21 0728 09/30/21 0812  ? 09/27/21 0600  ceFAZolin (ANCEF) IVPB 2g/100 mL premix  Status:  Discontinued       ? 2 g ?200 mL/hr over 30 Minutes Intravenous On call to O.R. 09/27/21 0146 09/27/21 1634  ? 09/23/21 0809  ceFAZolin (ANCEF) 2-4 GM/100ML-% IVPB       ?Note to Pharmacy: [place] Calix, [place]: cabinet override  ?    09/23/21 0809 09/23/21 2014  ? 09/23/21 0600  ceFAZolin (ANCEF) IVPB 2g/100 mL premix  Status:  Discontinued       ? 2 g ?200 mL/hr over 30 Minutes Intravenous On call to O.R. 09/23/21  8403 09/23/21 1358  ? ?  ? ? ?Data Reviewed: I have personally reviewed following labs and imaging studies ? ?CBC: ?Recent Labs  ?Lab 09/24/21 ?7543 09/27/21 ?6067 09/28/21 ?7034 09/30/21 ?0352  ?WBC 5.4 6.9 7.6 6.6  ?HGB 9.7* 10.0* 9.3* 9.6*  ?HCT 31.4* 32.2* 30.1* 31.0*  ?MCV 95.2 95.0 94.7 95.1  ?PLT 177 175 171 185  ? ?Basic Metabolic Panel: ?Recent Labs  ?Lab 09/24/21 ?4818 09/25/21 ?0425 09/27/21 ?5909 09/28/21 ?3112 09/30/21 ?1624  ?NA 138 141 139 139 141  ?K 3.5 3.8 4.0 3.9 3.9  ?CL 99 104 101 105 105  ?CO2 28 25 26 26 26   ?GLUCOSE 85 103* 86 121* 85  ?BUN 35* 54* 32* 48* 43*  ?CREATININE 4.81* 7.41* 5.32* 7.02* 7.55*  ?CALCIUM 7.7* 7.5* 7.8* 7.5* 7.9*  ?MG 1.6* 2.0  --   --   --   ?PHOS  --   --  4.6  --   --   ? ?GFR: ?Estimated Creatinine Clearance: 7.3 mL/min (A) (by C-G formula based on SCr of 7.55 mg/dL (H)). ?Liver Function Tests: ?Recent Labs  ?Lab 09/27/21 ?4695  ?ALBUMIN 2.3*  ? ?No results for input(s): LIPASE, AMYLASE in the last 168 hours. ?No results for input(s): AMMONIA in the last 168  hours. ?Coagulation Profile: ?No results for input(s): INR, PROTIME in the last 168 hours. ?Cardiac Enzymes: ?No results for input(s): CKTOTAL, CKMB, CKMBINDEX, TROPONINI in the last 168 hours. ?BNP (last 3 results) ?No results fo

## 2021-09-30 NOTE — Anesthesia Procedure Notes (Signed)
Anesthesia Regional Block: Supraclavicular block  ? ?Pre-Anesthetic Checklist: , timeout performed,  Correct Patient, Correct Site, Correct Laterality,  Correct Procedure, Correct Position, site marked,  Risks and benefits discussed,  Surgical consent,  Pre-op evaluation,  At surgeon's request and post-op pain management ? ?Laterality: Upper and Left ? ?Prep: chloraprep     ?  ?Needles:  ?Injection technique: Single-shot ? ?Needle Type: Stimiplex   ? ? ?Needle Length: 9cm  ?Needle Gauge: 22  ? ? ? ?Additional Needles: ? ? ?Procedures:,,,, ultrasound used (permanent image in chart),,    ?Narrative:  ?Start time: 09/30/2021 7:38 AM ?End time: 09/30/2021 7:42 AM ?Injection made incrementally with aspirations every 5 mL. ? ?Performed by: Personally  ?Anesthesiologist: Torian Thoennes, Precious Haws, MD ? ?Additional Notes: ?Axillary ring place on left arm as well ? ?Patient consented for risk and benefits of nerve block including but not limited to nerve damage, failed block, bleeding and infection.  Patient voiced understanding. ? ?Functioning IV was confirmed and monitors were applied.  Timeout done prior to procedure and prior to any sedation being given to the patient.  Patient confirmed procedure site prior to any sedation given to the patient.  A 36mm 22ga Stimuplex needle was used. Sterile prep,hand hygiene and sterile gloves were used.  Minimal sedation used for procedure.  No paresthesia endorsed by patient during the procedure.  Negative aspiration and negative test dose prior to incremental administration of local anesthetic. The patient tolerated the procedure well with no immediate complications. ? ? ? ?

## 2021-09-30 NOTE — Interval H&P Note (Signed)
History and Physical Interval Note: ? ?09/30/2021 ?7:38 AM ? ?Larry Morrison  has presented today for surgery, with the diagnosis of ESRD.  The various methods of treatment have been discussed with the patient and family. After consideration of risks, benefits and other options for treatment, the patient has consented to  Procedure(s): ?INSERTION OF ARTERIOVENOUS (AV) GORE-TEX GRAFT ARM BRACHIAL AXILLARY (Left) as a surgical intervention.  The patient's history has been reviewed, patient examined, no change in status, stable for surgery.  I have reviewed the patient's chart and labs.  Questions were answered to the patient's satisfaction.   ? ? ?[name] ? ? ?

## 2021-09-30 NOTE — Progress Notes (Signed)
[facility]  ROUNDING NOTE   Subjective:   Larry Morrison is a 51 year old African-American male with past medical history including pulmonary hypertension, combined CHF, CVA, hypertension, neurogenic bladder with right nephrostomy tube, and end-stage renal disease on hemodialysis.    Patient presented to vascular surgery for placement of an AV Gore-Tex graft for dialysis use.  Treatment team concerned about frail appearance of patient intolerance and scheduled procedure.  Patient will be admitted for AV graft malfunction (HCC) [T82.590A] SOB (shortness of breath) [R06.02]  Patient is known to our practice and receives outpatient dialysis treatments at [facility] on a MWF schedule,   Patient recently presented to the emergency department with complaints of shortness of breath after missing 2 dialysis treatments.     Patient main concern in today visit was if he could go home after dialysis    Objective:  Vital signs in last 24 hours:  Temp:  [97.8 F (36.6 C)-99.5 F (37.5 C)] 97.8 F (36.6 C) (05/12 0716) Pulse Rate:  [74-80] 77 (05/12 0746) Resp:  [17-18] 18 (05/12 0716) BP: (113-136)/(63-92) 129/92 (05/12 0746) SpO2:  [94 %-99 %] 95 % (05/12 0746) Weight:  [45.8 kg] 45.8 kg (05/12 0716)  Weight change: 1.4 kg Filed Weights   09/29/21 0407 09/30/21 0500 09/30/21 0716  Weight: 45 kg 45.8 kg 45.8 kg    Intake/Output: I/O last 3 completed shifts: In: 300 [P.O.:300] Out: 225 [Urine:225]   Intake/Output this shift:  No intake/output data recorded.  Physical Exam: General: NAD, resting quietly  Head: Normocephalic, atraumatic. Moist oral mucosal membranes  Eyes: Anicteric  Lungs:  Clear to auscultation, normal effort on room air  Heart: Regular rate and rhythm  Abdomen:  Soft, nontender, nondistended  Extremities: No peripheral edema.  Neurologic: Alert, moving all four extremities  Skin: No lesions  Access: Right IJ PermCath  Right nephrostomy  tube. Now AV graft placement on the left side  Basic Metabolic Panel: Recent Labs  Lab 09/24/21 0043 09/25/21 0425 09/27/21 0453 09/28/21 0539 09/30/21 0628  NA 138 141 139 139 141  K 3.5 3.8 4.0 3.9 3.9  CL 99 104 101 105 105  CO2 28 25 26 26 26   GLUCOSE 85 103* 86 121* 85  BUN 35* 54* 32* 48* 43*  CREATININE 4.81* 7.41* 5.32* 7.02* 7.55*  CALCIUM 7.7* 7.5* 7.8* 7.5* 7.9*  MG 1.6* 2.0  --   --   --   PHOS  --   --  4.6  --   --     Liver Function Tests: Recent Labs  Lab 09/27/21 0453  ALBUMIN 2.3*   No results for input(s): LIPASE, AMYLASE in the last 168 hours. No results for input(s): AMMONIA in the last 168 hours.  CBC: Recent Labs  Lab 09/23/21 0951 09/24/21 0043 09/27/21 0453 09/28/21 0539 09/30/21 0628  WBC 6.6 5.4 6.9 7.6 6.6  HGB 10.8* 9.7* 10.0* 9.3* 9.6*  HCT 34.9* 31.4* 32.2* 30.1* 31.0*  MCV 95.6 95.2 95.0 94.7 95.1  PLT 208 177 175 171 185    Cardiac Enzymes: No results for input(s): CKTOTAL, CKMB, CKMBINDEX, TROPONINI in the last 168 hours.  BNP: Invalid input(s): POCBNP  CBG: Recent Labs  Lab 09/23/21 0810  GLUCAP 76    Microbiology: Results for orders placed or performed during the hospital encounter of 09/23/21  Resp Panel by RT-PCR (Flu A&B, Covid) Nasopharyngeal Swab     Status: None   Collection Time: 09/23/21  4:21 PM   Specimen: Nasopharyngeal  Swab; Nasopharyngeal(NP) swabs in vial transport medium  Result Value Ref Range Status   SARS Coronavirus 2 by RT PCR NEGATIVE NEGATIVE Final    Comment: (NOTE) SARS-CoV-2 target nucleic acids are NOT DETECTED.  The SARS-CoV-2 RNA is generally detectable in upper respiratory specimens during the acute phase of infection. The lowest concentration of SARS-CoV-2 viral copies this assay can detect is 138 copies/mL. A negative result does not preclude SARS-Cov-2 infection and should not be used as the sole basis for treatment or other patient management decisions. A negative result may  occur with  improper specimen collection/handling, submission of specimen other than nasopharyngeal swab, presence of viral mutation(s) within the areas targeted by this assay, and inadequate number of viral copies(<138 copies/mL). A negative result must be combined with clinical observations, patient history, and epidemiological information. The expected result is Negative.  Fact Sheet for Patients:  BloggerCourse.com  Fact Sheet for Healthcare Providers:  SeriousBroker.it  This test is no t yet approved or cleared by the [place] FDA and  has been authorized for detection and/or diagnosis of SARS-CoV-2 by FDA under an Emergency Use Authorization (EUA). This EUA will remain  in effect (meaning this test can be used) for the duration of the COVID-19 declaration under Section 564(b)(1) of the Act, 21 U.S.C.section 360bbb-3(b)(1), unless the authorization is terminated  or revoked sooner.       Influenza A by PCR NEGATIVE NEGATIVE Final   Influenza B by PCR NEGATIVE NEGATIVE Final    Comment: (NOTE) The Xpert Xpress SARS-CoV-2/FLU/RSV plus assay is intended as an aid in the diagnosis of influenza from Nasopharyngeal swab specimens and should not be used as a sole basis for treatment. Nasal washings and aspirates are unacceptable for Xpert Xpress SARS-CoV-2/FLU/RSV testing.  Fact Sheet for Patients: BloggerCourse.com  Fact Sheet for Healthcare Providers: SeriousBroker.it  This test is not yet approved or cleared by the [place] FDA and has been authorized for detection and/or diagnosis of SARS-CoV-2 by FDA under an Emergency Use Authorization (EUA). This EUA will remain in effect (meaning this test can be used) for the duration of the COVID-19 declaration under Section 564(b)(1) of the Act, 21 U.S.C. section 360bbb-3(b)(1), unless the authorization is terminated  or revoked.  Performed at [facility], [address] [place], [place] [address]   MRSA Next Gen by PCR, Nasal     Status: None   Collection Time: 09/28/21  9:58 PM   Specimen: Nasal Mucosa; Nasal Swab  Result Value Ref Range Status   MRSA by PCR Next Gen NOT DETECTED NOT DETECTED Final    Comment: (NOTE) The GeneXpert MRSA Assay (FDA approved for NASAL specimens only), is one component of a comprehensive MRSA colonization surveillance program. It is not intended to diagnose MRSA infection nor to guide or monitor treatment for MRSA infections. Test performance is not FDA approved in patients less than 60 years old. Performed at [facility], [address] [place], [place] [address]     Coagulation Studies: No results for input(s): LABPROT, INR in the last 72 hours.  Urinalysis: No results for input(s): COLORURINE, LABSPEC, PHURINE, GLUCOSEU, HGBUR, BILIRUBINUR, KETONESUR, PROTEINUR, UROBILINOGEN, NITRITE, LEUKOCYTESUR in the last 72 hours.  Invalid input(s): APPERANCEUR     Imaging: [place] OR NERVE BLOCK-IMAGE ONLY [facility])  Result Date: 09/30/2021 There is no interpretation for this exam.  This order is for images obtained during a surgical procedure.  Please See "Surgeries" Tab for more information regarding the procedure.  Medications:    ceFAZolin      ceFAZolin (ANCEF) IV         [MAR Hold] amLODipine  5 mg Oral QPM   [MAR Hold] aspirin EC  81 mg Oral Daily   [MAR Hold] atorvastatin  40 mg Oral Daily   [MAR Hold] calcium acetate  1,334 mg Oral TID WC   [MAR Hold] carvedilol  12.5 mg Oral BID   [MAR Hold] Chlorhexidine Gluconate Cloth  6 each Topical Daily   [MAR Hold] epoetin (EPOGEN/PROCRIT) injection  4,000 Units Intravenous Q M,W,F-HD   [MAR Hold] feeding supplement  237 mL Oral BID BM   [MAR Hold] heparin  5,000 Units Subcutaneous Q8H   ipratropium-albuterol  3 mL Nebulization Q4H   [MAR Hold] irbesartan  150 mg Oral QPM   [MAR  Hold] multivitamin  1 tablet Oral QHS   [MAR Hold] nicotine  21 mg Transdermal Q24H     Assessment/ Plan:    Larry Morrison is a 51 y.o.  male with past medical history including pulmonary hypertension, combined CHF, CVA, hypertension, neurogenic bladder with right nephrostomy tube, and end-stage renal disease on hemodialysis.    [facility]/MWF/Rt Permcath  End stage renal disease requiring hemodialysis.             AV graft placement postponed .           Patient was dialyzed yesterday-Wednesday .           Plan was to dialyze patient today but patient went for AV graft and thereafter did not want to go for dialysis.  We will dialyze patient tomorrow   2. Anemia of chronic kidney disease Macrocytic Lab Results  Component Value Date   HGB 9.6 (L) 09/30/2021    Recieves mircera as outpt.   Epogen with hemodialysis treatments  3. Secondary Hyperparathyroidism:  Lab Results  Component Value Date   CALCIUM 7.9 (L) 09/30/2021   CAION 0.79 (LL) 09/23/2021   PHOS 4.6 09/27/2021    Calcium and phosphorus now within acceptable range.   We will continue to monitor.  On  calcium acetate with meals We will follow up on phosphorus  4. Hypertension with chronic kidney disease. Home regimen includes carvedilol and irbesartan.    Blood pressure better controlled after adding amlodipine to his regimen.   5. Access  patient was to have AV graft surgery.  It has been  been canceled secondary to multiple issues such as shortness of breath and later patient having cocaine positive on his drug screen Patient had AVG placed today-Sep 30, 2021  6. cocaine abuse- educated patient to stay away from illegal drugs      LOS: 7 Dakia Schifano s [facility] 5/12/20237:54 AM

## 2021-09-30 NOTE — Progress Notes (Signed)
Patient refusing all meds and refusing dialysis treatment. MD notified.  ?

## 2021-09-30 NOTE — Plan of Care (Signed)
  Problem: Activity: Goal: Ability to tolerate increased activity will improve Outcome: Progressing Goal: Will verbalize the importance of balancing activity with adequate rest periods Outcome: Progressing   Problem: Respiratory: Goal: Ability to maintain a clear airway will improve Outcome: Progressing Goal: Levels of oxygenation will improve Outcome: Progressing Goal: Ability to maintain adequate ventilation will improve Outcome: Progressing   

## 2021-09-30 NOTE — Anesthesia Postprocedure Evaluation (Signed)
Anesthesia Post Note ? ?Patient: Larry Morrison ? ?Procedure(s) Performed: INSERTION OF ARTERIOVENOUS (AV) GORE-TEX GRAFT ARM BRACHIAL AXILLARY (Left) ? ?Patient location during evaluation: PACU ?Anesthesia Type: Regional ?Level of consciousness: awake and alert ?Pain management: pain level controlled ?Vital Signs Assessment: post-procedure vital signs reviewed and stable ?Respiratory status: spontaneous breathing, nonlabored ventilation, respiratory function stable and patient connected to nasal cannula oxygen ?Cardiovascular status: blood pressure returned to baseline and stable ?Postop Assessment: no apparent nausea or vomiting ?Anesthetic complications: no ? ? ?No notable events documented. ? ? ?Last Vitals:  ?Vitals:  ? 09/30/21 1045 09/30/21 1100  ?BP: 97/65 97/76  ?Pulse: 74 79  ?Resp: 19 20  ?Temp:  37.1 ?C  ?SpO2: 100% 95%  ?  ?Last Pain:  ?Vitals:  ? 09/30/21 1100  ?TempSrc:   ?PainSc: 0-No pain  ? ? ?  ?  ?  ?  ?  ?  ? ?Precious Haws Graceann Boileau ? ? ? ? ?

## 2021-09-30 NOTE — Op Note (Signed)
? ? ? ?OPERATIVE NOTE ? ? ?PROCEDURE: ?left brachial axillary arteriovenous graft placement ? ?PRE-OPERATIVE DIAGNOSIS: End Stage Renal Disease ? ?POST-OPERATIVE DIAGNOSIS: End Stage Renal Disease ? ?SURGEON: [name] ? ?ASSISTANT(S): None ? ?ANESTHESIA: regional ? ?ESTIMATED BLOOD LOSS: <50 cc ? ?FINDING(S): ?4-5 mm axillary vein and a 3 mm brachial artery ? ?SPECIMEN(S):  none ? ?INDICATIONS:   ?Larry Morrison is a 51 y.o. male who presents with end stage renal disease.  The patient is scheduled for left brachial axillary AV graft placement.  The patient is aware the risks include but are not limited to: bleeding, infection, steal syndrome, nerve damage, ischemic monomelic neuropathy, failure to mature, and need for additional procedures.  The patient is aware of the risks of the procedure and elects to proceed forward. ? ?DESCRIPTION: ?After full informed written consent was obtained from the patient, the patient was brought back to the operating room and placed supine upon the operating table.  Prior to induction, the patient received IV antibiotics.   After obtaining adequate anesthesia, the patient was then prepped and draped in the standard fashion for a left arm access procedure.  ? ?A first assistant was required to provide a safe and appropriate environment for executing the surgery.  The assistant was integral in providing retraction, exposure, running suture providing suction and in the closing process. ? ? A linear incision was then created along the medial border of the biceps muscle just proximal to the antecubital crease and the brachial artery which was exposed through. The brachial artery was then looped proximally and distally with Silastic Vesseloops. Side branches were controlled with 4-0 silk ties. ? ?Attention was then turned to the exposure of the axillary vein. Linear incision was then created medial to the proximal portion of the biceps at the level of the anterior axillary crease.  The axillary vein was exposed and again looped proximally and distally with Silastic vessel loops. Associated tributaries were also controlled with Silastic Vesseloops. ? ?The Gore tunneler was then delivered onto the field and a subcutaneous path was made from the arterial incision to the venous incision. A 4-7 tapered PTFE propatent graft by Simeon Craft was then pulled through the subcutaneous tunnel. The arterial 4 mm portion was then approximated to the brachial artery. Brachial artery was controlled proximally and distally with the Silastic Vesseloops. Arteriotomy was made with an 11 blade scalpel and extended with Potts scissors and a 6-0 Prolene stay suture was placed. End graft to side brachial artery anastomosis was then fashioned with running CV 6 suture. Flushing maneuvers were performed suture line was hemostatic and the graft was then assessed for proper position and ease of future cannulation. Heparinized saline was infused into the vein and the graft was clamped with a vascular clamp. With the graft pressurized it was approximated to the axillary vein in its native bed and then marked with a surgical marker. The vein was then delivered into the surgical field and controlled with the Silastic vessel loops. Venotomy was then made with an 11 blade scalpel and extended with Potts scissors and a 6-0 Prolene suture was used as stay suture. The the graft was then sewn to the vein in an end graft to side vein fashion using running CV 6 suture.  Flushing maneuvers were performed and the artery was allowed to forward and back bleed.  Flow was then established through the AV graft ? ?There was good  thrill in the venous outflow, and there was 1+ palpable radial pulse.  At this point, I irrigated out the surgical wounds.  There was no further active bleeding.  The subcutaneous tissue was reapproximated with a running stitch of 3-0 Vicryl.  The skin was then reapproximated with a running subcuticular stitch of 4-0  Vicryl.  The skin was then cleaned, dried, and reinforced with Dermabond.   ? ?The patient tolerated this procedure well.  ? ?COMPLICATIONS: None ? ?CONDITION: Good ? ? ?[name] ?[facility]  ?Office: [phone] ? ? ?09/30/2021, 10:25 AM ? ?  ?
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
## 2021-10-01 MED ORDER — EPOETIN ALFA 10000 UNIT/ML IJ SOLN
4000.0000 [IU] | INTRAMUSCULAR | Status: DC
Start: 2021-10-01 — End: 2022-11-07

## 2021-10-01 MED ORDER — IRBESARTAN 75 MG PO TABS: 150.0000 mg | ORAL_TABLET | Freq: Every evening | ORAL | 0 refills | Status: DC

## 2021-10-01 MED ORDER — CALCIUM ACETATE (PHOS BINDER) 667 MG PO CAPS: 1334.0000 mg | ORAL_CAPSULE | Freq: Three times a day (TID) | ORAL | 0 refills | Status: AC

## 2021-10-01 MED ORDER — ACETAMINOPHEN 325 MG PO TABS
ORAL_TABLET | ORAL | Status: AC
  Filled 2021-10-01: qty 2

## 2021-10-01 MED ORDER — ASPIRIN 81 MG PO TBEC: 81.0000 mg | DELAYED_RELEASE_TABLET | Freq: Every day | ORAL | 11 refills | Status: AC

## 2021-10-01 MED ORDER — ACETAMINOPHEN 325 MG PO TABS
ORAL_TABLET | ORAL | Status: AC
  Filled 2021-10-01: qty 1

## 2021-10-01 MED ORDER — ALBUTEROL SULFATE HFA 108 (90 BASE) MCG/ACT IN AERS: 1.0000 | INHALATION_SPRAY | RESPIRATORY_TRACT | 0 refills | Status: AC | PRN

## 2021-10-01 MED ORDER — ENSURE ENLIVE PO LIQD: 237.0000 mL | Freq: Two times a day (BID) | ORAL | 12 refills | Status: AC

## 2021-10-01 MED ORDER — NICOTINE 21 MG/24HR TD PT24: 21.0000 mg | MEDICATED_PATCH | TRANSDERMAL | 0 refills | Status: DC

## 2021-10-01 MED ORDER — RENA-VITE PO TABS: 1.0000 | ORAL_TABLET | Freq: Every day | ORAL | 0 refills | Status: AC

## 2021-10-01 MED ORDER — ATORVASTATIN CALCIUM 40 MG PO TABS: 40.0000 mg | ORAL_TABLET | Freq: Every day | ORAL | 0 refills | Status: AC

## 2021-10-01 MED ORDER — ACETAMINOPHEN 325 MG PO TABS
650.0000 mg | ORAL_TABLET | Freq: Four times a day (QID) | ORAL | 0 refills | Status: AC | PRN
Start: 2021-10-01 — End: ?

## 2021-10-01 MED ORDER — HEPARIN SODIUM (PORCINE) 1000 UNIT/ML IJ SOLN
INTRAMUSCULAR | Status: AC
  Administered 2021-10-01: 4200 [IU]
  Filled 2021-10-01: qty 10

## 2021-10-01 NOTE — Progress Notes (Signed)
Pt discharged per MD order. IV removed. Discharge instructions reviewed with pt. Pt verbalized understanding. All Questions answered to pt satisfaction. Pt taken downstairs in wheelchair by staff.  ?

## 2021-10-01 NOTE — Progress Notes (Signed)
[facility]  ROUNDING NOTE   Subjective:   Larry Morrison is a 51 year old African-American male with past medical history including pulmonary hypertension, combined CHF, CVA, hypertension, neurogenic bladder with right nephrostomy tube, and end-stage renal disease on hemodialysis.    Patient presented to vascular surgery for placement of an AV Gore-Tex graft for dialysis use.  Treatment team concerned about frail appearance of patient intolerance and scheduled procedure.  Patient will be admitted for AV graft malfunction (HCC) [T82.590A] SOB (shortness of breath) [R06.02]  Patient is known to our practice and receives outpatient dialysis treatments at [facility] on a MWF schedule,   Patient recently presented to the emergency department with complaints of shortness of breath after missing 2 dialysis treatments.      Patient was seen today on dialysis. Patient offers no new specific physical complaints. Patient is tolerating treatment well    Objective:  Vital signs in last 24 hours:  Temp:  [98.1 F (36.7 C)-98.8 F (37.1 C)] 98.1 F (36.7 C) (05/13 1218) Pulse Rate:  [60-80] 65 (05/13 1245) Resp:  [16-30] 23 (05/13 1245) BP: (90-116)/(44-71) 103/53 (05/13 1245) SpO2:  [94 %-100 %] 100 % (05/13 1245) Weight:  [44.2 kg-45.6 kg] 44.2 kg (05/13 1222)  Weight change: 0 kg Filed Weights   09/30/21 0716 10/01/21 0906 10/01/21 1222  Weight: 45.8 kg 45.6 kg 44.2 kg    Intake/Output: I/O last 3 completed shifts: In: 1140 [P.O.:840; I.V.:250; IV Piggyback:50] Out: 325 [Urine:325]   Intake/Output this shift:  Total I/O In: -  Out: 1000 [Other:1000]  Physical Exam: General: NAD, resting quietly  Head: Normocephalic, atraumatic. Moist oral mucosal membranes  Eyes: Anicteric  Lungs:  Clear to auscultation, normal effort on room air  Heart: Regular rate and rhythm  Abdomen:  Soft, nontender, nondistended  Extremities: No peripheral edema.  Neurologic:  Alert, moving all four extremities  Skin: No lesions  Access: Right IJ PermCath-being used  Right nephrostomy tube. Now AV graft placement on the left side  Basic Metabolic Panel: Recent Labs  Lab 09/25/21 0425 09/27/21 0453 09/28/21 0539 09/30/21 0628  NA 141 139 139 141  K 3.8 4.0 3.9 3.9  CL 104 101 105 105  CO2 25 26 26 26   GLUCOSE 103* 86 121* 85  BUN 54* 32* 48* 43*  CREATININE 7.41* 5.32* 7.02* 7.55*  CALCIUM 7.5* 7.8* 7.5* 7.9*  MG 2.0  --   --   --   PHOS  --  4.6  --   --     Liver Function Tests: Recent Labs  Lab 09/27/21 0453  ALBUMIN 2.3*   No results for input(s): LIPASE, AMYLASE in the last 168 hours. No results for input(s): AMMONIA in the last 168 hours.  CBC: Recent Labs  Lab 09/27/21 0453 09/28/21 0539 09/30/21 0628  WBC 6.9 7.6 6.6  HGB 10.0* 9.3* 9.6*  HCT 32.2* 30.1* 31.0*  MCV 95.0 94.7 95.1  PLT 175 171 185    Cardiac Enzymes: No results for input(s): CKTOTAL, CKMB, CKMBINDEX, TROPONINI in the last 168 hours.  BNP: Invalid input(s): POCBNP  CBG: No results for input(s): GLUCAP in the last 168 hours.   Microbiology: Results for orders placed or performed during the hospital encounter of 09/23/21  Resp Panel by RT-PCR (Flu A&B, Covid) Nasopharyngeal Swab     Status: None   Collection Time: 09/23/21  4:21 PM   Specimen: Nasopharyngeal Swab; Nasopharyngeal(NP) swabs in vial transport medium  Result Value Ref Range Status  SARS Coronavirus 2 by RT PCR NEGATIVE NEGATIVE Final    Comment: (NOTE) SARS-CoV-2 target nucleic acids are NOT DETECTED.  The SARS-CoV-2 RNA is generally detectable in upper respiratory specimens during the acute phase of infection. The lowest concentration of SARS-CoV-2 viral copies this assay can detect is 138 copies/mL. A negative result does not preclude SARS-Cov-2 infection and should not be used as the sole basis for treatment or other patient management decisions. A negative result may occur with   improper specimen collection/handling, submission of specimen other than nasopharyngeal swab, presence of viral mutation(s) within the areas targeted by this assay, and inadequate number of viral copies(<138 copies/mL). A negative result must be combined with clinical observations, patient history, and epidemiological information. The expected result is Negative.  Fact Sheet for Patients:  BloggerCourse.com  Fact Sheet for Healthcare Providers:  SeriousBroker.it  This test is no t yet approved or cleared by the [place] FDA and  has been authorized for detection and/or diagnosis of SARS-CoV-2 by FDA under an Emergency Use Authorization (EUA). This EUA will remain  in effect (meaning this test can be used) for the duration of the COVID-19 declaration under Section 564(b)(1) of the Act, 21 U.S.C.section 360bbb-3(b)(1), unless the authorization is terminated  or revoked sooner.       Influenza A by PCR NEGATIVE NEGATIVE Final   Influenza B by PCR NEGATIVE NEGATIVE Final    Comment: (NOTE) The Xpert Xpress SARS-CoV-2/FLU/RSV plus assay is intended as an aid in the diagnosis of influenza from Nasopharyngeal swab specimens and should not be used as a sole basis for treatment. Nasal washings and aspirates are unacceptable for Xpert Xpress SARS-CoV-2/FLU/RSV testing.  Fact Sheet for Patients: BloggerCourse.com  Fact Sheet for Healthcare Providers: SeriousBroker.it  This test is not yet approved or cleared by the [place] FDA and has been authorized for detection and/or diagnosis of SARS-CoV-2 by FDA under an Emergency Use Authorization (EUA). This EUA will remain in effect (meaning this test can be used) for the duration of the COVID-19 declaration under Section 564(b)(1) of the Act, 21 U.S.C. section 360bbb-3(b)(1), unless the authorization is terminated  or revoked.  Performed at [facility], [address] [place], [place] [address]   MRSA Next Gen by PCR, Nasal     Status: None   Collection Time: 09/28/21  9:58 PM   Specimen: Nasal Mucosa; Nasal Swab  Result Value Ref Range Status   MRSA by PCR Next Gen NOT DETECTED NOT DETECTED Final    Comment: (NOTE) The GeneXpert MRSA Assay (FDA approved for NASAL specimens only), is one component of a comprehensive MRSA colonization surveillance program. It is not intended to diagnose MRSA infection nor to guide or monitor treatment for MRSA infections. Test performance is not FDA approved in patients less than 42 years old. Performed at [facility], [address] [place], [place] [address]     Coagulation Studies: No results for input(s): LABPROT, INR in the last 72 hours.  Urinalysis: No results for input(s): COLORURINE, LABSPEC, PHURINE, GLUCOSEU, HGBUR, BILIRUBINUR, KETONESUR, PROTEINUR, UROBILINOGEN, NITRITE, LEUKOCYTESUR in the last 72 hours.  Invalid input(s): APPERANCEUR     Imaging: [place] OR NERVE BLOCK-IMAGE ONLY [facility])  Result Date: 09/30/2021 There is no interpretation for this exam.  This order is for images obtained during a surgical procedure.  Please See "Surgeries" Tab for more information regarding the procedure.     Medications:         acetaminophen  acetaminophen       amLODipine  5 mg Oral QPM   aspirin EC  81 mg Oral Daily   atorvastatin  40 mg Oral Daily   calcium acetate  1,334 mg Oral TID WC   carvedilol  12.5 mg Oral BID   Chlorhexidine Gluconate Cloth  6 each Topical Daily   epoetin (EPOGEN/PROCRIT) injection  4,000 Units Intravenous Q M,W,F-HD   feeding supplement  237 mL Oral BID BM   heparin  5,000 Units Subcutaneous Q8H   irbesartan  150 mg Oral QPM   multivitamin  1 tablet Oral QHS   nicotine  21 mg Transdermal Q24H     Assessment/ Plan:    [name] is a 50 y.o.  male with past medical history  including pulmonary hypertension, combined CHF, CVA, hypertension, neurogenic bladder with right nephrostomy tube, and end-stage renal disease on hemodialysis.    [facility]/MWF/Rt Permcath  End stage renal disease requiring hemodialysis.             AV graft placement postponed .           Patient was dialyzed yesterday-Wednesday .           Plan was to dialyze patient today but patient went for AV graft and thereafter did not want to go for dialysis.  We will dialyze patient tomorrow   2. Anemia of chronic kidney disease Macrocytic Lab Results  Component Value Date   HGB 9.6 (L) 09/30/2021    Recieves mircera as outpt.   Epogen with hemodialysis treatments  3. Secondary Hyperparathyroidism:  Lab Results  Component Value Date   CALCIUM 7.9 (L) 09/30/2021   CAION 0.79 (LL) 09/23/2021   PHOS 4.6 09/27/2021    Calcium and phosphorus now within acceptable range.   We will continue to monitor.  On  calcium acetate with meals We will follow up on phosphorus  4. Hypertension with chronic kidney disease.      Blood pressure now better controlled   5. Access  patient was to have AV graft surgery.  It has been  been canceled secondary to multiple issues such as shortness of breath and later patient having cocaine positive on his drug screen Patient had AVG placed today-Sep 30, 2021  6. cocaine abuse- educated patient to stay away from illegal drugs      LOS: 8 Larry Morrison s [facility] 5/13/20231:09 PM

## 2021-10-01 NOTE — Progress Notes (Addendum)
?  ?  ?  Daily Progress Note ? ? ?Assessment/Planning:  ? ?POD #1 s/p LUA AVG ? ?Hostile and inconsistent responses to questioning ?Patent AVG with palpable radial pulse ?When repeated asked: "I don't have any numbness in my LEFT hand, and I don't have any weakness in my fingers" ?F/U with Dr. Ronalee Belts as scheduled in 2 weeks ? ? ?Subjective  - 1 Day Post-Op  ? ?hostile ? ? ?Objective  ? ?Vitals:  ? 09/30/21 1933 09/30/21 1933 10/01/21 0457 10/01/21 0821  ?BP: 106/71 106/71 98/66 (!) 90/51  ?Pulse: 80 79 73 74  ?Resp: 20 20 20 18   ?Temp: 98.4 ?F (36.9 ?C) 98.4 ?F (36.9 ?C) 98.8 ?F (37.1 ?C) 98.5 ?F (36.9 ?C)  ?TempSrc: Oral Oral Oral Oral  ?SpO2: 96% 96% 96% 94%  ?Weight:      ?Height:      ? ? ? ?Intake/Output Summary (Last 24 hours) at 10/01/2021 0839 ?Last data filed at 10/01/2021 0500 ?Gross per 24 hour  ?Intake 910 ml  ?Output 150 ml  ?Net 760 ml  ? ? ?VASC LUE: inc c/d/I, strong thrill, strong radial pulses, refused hand exam, reports hand grip and sensation intact  ? ? ?Laboratory  ? ?CBC ? ?  Latest Ref Rng & Units 09/30/2021  ?  6:28 AM 09/28/2021  ?  5:39 AM 09/27/2021  ?  4:53 AM  ?CBC  ?WBC 4.0 - 10.5 K/uL 6.6   7.6   6.9    ?Hemoglobin 13.0 - 17.0 g/dL 9.6   9.3   10.0    ?Hematocrit 39.0 - 52.0 % 31.0   30.1   32.2    ?Platelets 150 - 400 K/uL 185   171   175    ? ? ?BMET ?   ?Component Value Date/Time  ? NA 141 09/30/2021 0628  ? NA 138 10/22/2013 1732  ? K 3.9 09/30/2021 0628  ? K 4.7 10/22/2013 1732  ? CL 105 09/30/2021 0628  ? CL 112 (H) 10/22/2013 1732  ? CO2 26 09/30/2021 0628  ? CO2 20 (L) 10/22/2013 1732  ? GLUCOSE 85 09/30/2021 0628  ? GLUCOSE 68 10/22/2013 1732  ? BUN 43 (H) 09/30/2021 8250  ? BUN 74 (H) 10/22/2013 1732  ? CREATININE 7.55 (H) 09/30/2021 0370  ? CREATININE 5.22 (H) 10/22/2013 1732  ? CALCIUM 7.9 (L) 09/30/2021 4888  ? CALCIUM 9.3 10/22/2013 1732  ? GFRNONAA 8 (L) 09/30/2021 9169  ? GFRNONAA 12 (L) 10/22/2013 1732  ? GFRAA 9 (L) 11/20/2019 1407  ? GFRAA 14 (L) 10/22/2013 1732   ? ? ? ?Adele Barthel, MD, FACS, FSVS ?Covering for [facility]: [phone] ? ?10/01/2021, 8:39 AM ? ? ? ? ? ? ?

## 2021-10-01 NOTE — Progress Notes (Signed)
Hemodialysis Post Treatment Note: ? ?Tx date:10/01/2021 ?Tx time: 3 hours ?Access: right CVC ?UF Removed: 1L ? ?Note: HD completed, Tolerated well. No complications.  ? ? ? ? ? ? ? ? ?  ?

## 2021-10-01 NOTE — Discharge Summary (Signed)
Physician Discharge Summary  ?Larry Morrison QMV:[id number] DOB: [birth year] DOA: 09/23/2021 ? ?PCP: [facility] [place] ? ?Admit date: 09/23/2021 ?Discharge date: 10/01/2021 ? ?Admitted From: home ?Disposition:  home ? ?Recommendations for Outpatient Follow-up:  ?Follow up with PCP in 1-2 weeks ?Continue dialysis as scheduled  ?Keep appointment w/ Dr Ronalee Belts  ?Please follow up on the following pending results: none ? ?Home Health: none  ?Equipment/Devices: pt had wheelchair prior to hospitalization  ? ?Discharge Condition: good  ?CODE STATUS: FULL  ?Diet recommendation:  ?Diet Orders (From admission, onward)  ? ?  Start     Ordered  ? 10/01/21 0000  Diet - low sodium heart healthy       ? 10/01/21 1406  ? 09/30/21 1129  Diet renal with fluid restriction Fluid restriction: 1200 mL Fluid; Room service appropriate? Yes; Fluid consistency: Thin  Diet effective now       ?Question Answer Comment  ?Fluid restriction: 1200 mL Fluid   ?Room service appropriate? Yes   ?Fluid consistency: Thin   ?  ? 09/30/21 1128  ? ?  ?  ? ?  ? ? ?Brief Narrative & Hospital COurse:  ?  ?Larry Morrison is a 51 y.o. male with medical history significant of ESRD-HD (MWF), hypertension, stroke, cocaine abuse, tobacco abuse, pulmonary hypertension, CHF with EF of<20%, anemia, cardiac arrest, who presented to the ED on 09/23/2021 with progressively worsening with shortness of breath over several days.  He had presented for outpatient vascular procedure that was canceled due to his shortness of breath.  He was seen in ED on 5/3 due to shortness of breath, and was thought to be due to missing 2 of his dialysis.  Patient was dialyzed the day before presenting. ED Course: WBC 6.6, BNP> 4500, UDS positive for cocaine, temperature normal, blood pressure 191/80, heart rate 94, RR 16, oxygen saturation 91-100% on room air.  X-ray showed stable bilateral pleural effusions, left greater than right, with associated atelectasis.  ?  ?Of note, patient has previously  had resection of the right brachial axillary AV graft.  As of admission he had no permanent extremity dialysis access.  He had been maintained via PermCath which has been working but his dialysis runs have not been ideal. Patient is scheduled for creation of a left brachial axillary AV graft by Dr. Delana Meyer of vascular surgery, but this was canceled due to shortness of breath, later canclled inpatient d/t persistence of cocaine metabolites on UDS. AV graft done 05/12, pt declining hemodialysis. Plan for HD tomorrow and if stable he will be appropriate fir discharge home ?  ?  ?  ?Consultants:  ?Nephrology - maintain ESRD dialysis  ?Vascular Surgery - need graft / permanent dialysis access  ?Cardiology - elevated troponins d/t demand ischemia ?  ?Procedures: ?Dialysis routine, most recent on date of discharge 10/01/21  ?AV graft LUE 09/30/2021 ? ? ? ? ?Discharge Diagnoses: ?Principal Problem: ?  SOB (shortness of breath) ?Active Problems: ?  Tobacco use disorder ?  Essential hypertension ?  Anemia in ESRD (end-stage renal disease) ([place]) ?  Elevated troponin ?  Malnutrition of moderate degree ?  ESRD on hemodialysis ([place]) ?  Cocaine use disorder ([place]) ?  History of ischemic stroke ?  Chronic combined systolic and diastolic CHF (congestive heart failure) ([place]) ? ? ? ?Assessment & Plan: ?SOB (shortness of breath) ?Resolved now ?Likely multifactorial etiology, including ESRD, sCHF, bilateral pleural effusion,missed dialysis.  Patient had elevated troponin level 157 --> 268, but no chest pain,  which is likely due to demand ischemia.  Dr. Holley Raring of renal was consulted for dialysis ?Bronchodilators ?Dialysis per renal ? ?ESRD on hemodialysis ([place]) ?Nephrology following for dialysis. ?Vascular surgery AV graft - delayed d/t persistence of cocaine metabolites in blood, AV graft able to be done 09/30/21.   ? ?Chronic combined systolic and diastolic CHF (congestive heart failure) ([place]) ?Due to dilated cardiomyopathy.  2D echo  04/12/2021 showed EF<20, BNP > 4500.  Patient may have mild fluid overload. ?Volume management per renal by dialysis ?Cardiology consulted, No plans for ischemic evaluation, patient has reportedly declined this ?Absolute cessation from cocaine and illicit drugs ?Holding amlodipine on discharge d/t soft BP ? ?Tobacco use disorder ?Nicotine patch ? ?Anemia in ESRD (end-stage renal disease) ([place]) ?Hemoglobin stable. ?Monitor CBC ? ?Elevated troponin ?Troponin level 157 --> 268, no chest pain likely due to demand ischemia and poor clearance in the setting of ESRD.   ?Cardiology consulted, started on aspirin and Lipitor ? ? ?Malnutrition of moderate degree ?continue Ensure ? ?Cocaine use disorder ([place]) ?Cocaine positive on UDS in the ED and on repeated UDS prior to graft procedure - cancelled procedure  ?Amlodipine for hypertension ? ? ?History of ischemic stroke ? aspirin and Lipitor ? ?Essential hypertension ?Continue amlodipine given cocaine abuse. ?Volume removal by dialysis ? ? ? ? ? ?Discharge Instructions ? ?Discharge Instructions   ? ? Diet - low sodium heart healthy   Complete by: As directed ?  ? Discharge instructions   Complete by: As directed ?  ? Continue w/ scheduled dialysis ?Follow with Dr. Ronalee Belts as scheduled in 2 weeks  ? Discharge wound care:   Complete by: As directed ?  ? Keep clean/dry  ? Increase activity slowly   Complete by: As directed ?  ? ?  ? ?Allergies as of 10/01/2021   ? ?   Reactions  ? Vancomycin   ? Patient denies - repeated denial to pharm tech 09-05-2020  ? ?  ? ?  ?Medication List  ?  ? ?TAKE these medications   ? ?acetaminophen 325 MG tablet ?Commonly known as: TYLENOL ?Take 2 tablets (650 mg total) by mouth every 6 (six) hours as needed for mild pain, fever or moderate pain. ?  ?albuterol 108 (90 Base) MCG/ACT inhaler ?Commonly known as: VENTOLIN HFA ?Inhale 1-2 puffs into the lungs every 4 (four) hours as needed for wheezing or shortness of breath. ?  ?aspirin 81 MG EC  tablet ?Take 1 tablet (81 mg total) by mouth daily. Swallow whole. ?Start taking on: Oct 02, 2021 ?  ?atorvastatin 40 MG tablet ?Commonly known as: LIPITOR ?Take 1 tablet (40 mg total) by mouth daily. ?Start taking on: Oct 02, 2021 ?  ?calcium acetate 667 MG capsule ?Commonly known as: PHOSLO ?Take 2 capsules (1,334 mg total) by mouth 3 (three) times daily with meals. ?  ?carvedilol 12.5 MG tablet ?Commonly known as: COREG ?Take 1 tablet (12.5 mg total) by mouth 2 (two) times daily. ?  ?epoetin alfa 10000 UNIT/ML injection ?Commonly known as: EPOGEN ?Inject 0.4 mLs (4,000 Units total) into the vein every Monday, Wednesday, and Friday with hemodialysis. ?  ?feeding supplement Liqd ?Take 237 mLs by mouth 2 (two) times daily between meals. ?  ?irbesartan 75 MG tablet ?Commonly known as: AVAPRO ?Take 2 tablets (150 mg total) by mouth every evening. ?  ?multivitamin Tabs tablet ?Take 1 tablet by mouth at bedtime. ?  ?nicotine 21 mg/24hr patch ?Commonly known as: NICODERM CQ - dosed in  mg/24 hours ?Place 1 patch (21 mg total) onto the skin daily. ?  ? ?  ? ?  ?  ? ? ?  ?Discharge Care Instructions  ?(From admission, onward)  ?  ? ? ?  ? ?  Start     Ordered  ? 10/01/21 0000  Discharge wound care:       ?Comments: Keep clean/dry  ? 10/01/21 1406  ? ?  ?  ? ?  ? ?Diet Orders (From admission, onward)  ? ?  Start     Ordered  ? 10/01/21 0000  Diet - low sodium heart healthy       ? 10/01/21 1406  ? 09/30/21 1129  Diet renal with fluid restriction Fluid restriction: 1200 mL Fluid; Room service appropriate? Yes; Fluid consistency: Thin  Diet effective now       ?Question Answer Comment  ?Fluid restriction: 1200 mL Fluid   ?Room service appropriate? Yes   ?Fluid consistency: Thin   ?  ? 09/30/21 1128  ? ?  ?  ? ?  ? ? ? ? Follow-up Information   ? ? Schnier, Dolores Lory, MD Follow up in 3 week(s).   ?Specialties: Vascular Surgery, Cardiology, Radiology, Vascular Surgery ?Why: follow up after procedure with an HDA ?Contact  information: ?[address] ?[place] [address] ?[phone] ? ? ?  ?  ? ?  ?  ? ?  ? ?Allergies  ?Allergen Reactions  ? Vancomycin   ?  Patient denies - repeated denial to pharm tech 09-05-2020  ? ? ? ?Subjective: seen

## 2021-10-01 NOTE — Progress Notes (Signed)
[facility] will take patient home. ?

## 2021-10-03 ENCOUNTER — Telehealth (INDEPENDENT_AMBULATORY_CARE_PROVIDER_SITE_OTHER): Payer: Self-pay

## 2021-10-04 NOTE — Telephone Encounter (Signed)
Left a detailed message on dialysis voicemail ?

## 2021-10-04 NOTE — Telephone Encounter (Signed)
No, it hasn't even had a chance to heal, it's only been four days.  It will be at least a month

## 2021-10-27 ENCOUNTER — Other Ambulatory Visit (INDEPENDENT_AMBULATORY_CARE_PROVIDER_SITE_OTHER): Payer: Self-pay | Admitting: Vascular Surgery

## 2021-10-27 DIAGNOSIS — N186 End stage renal disease: Secondary | ICD-10-CM

## 2021-10-27 DIAGNOSIS — Z95828 Presence of other vascular implants and grafts: Secondary | ICD-10-CM

## 2021-11-10 ENCOUNTER — Encounter (INDEPENDENT_AMBULATORY_CARE_PROVIDER_SITE_OTHER): Payer: Self-pay | Admitting: Nurse Practitioner

## 2021-11-10 ENCOUNTER — Ambulatory Visit (INDEPENDENT_AMBULATORY_CARE_PROVIDER_SITE_OTHER): Payer: Medicaid Other

## 2021-11-10 ENCOUNTER — Encounter: Payer: Self-pay | Admitting: Cardiology

## 2021-11-10 ENCOUNTER — Ambulatory Visit (INDEPENDENT_AMBULATORY_CARE_PROVIDER_SITE_OTHER): Payer: Medicaid Other | Admitting: Nurse Practitioner

## 2021-11-10 ENCOUNTER — Ambulatory Visit: Payer: Medicaid Other | Admitting: Cardiology

## 2021-11-10 VITALS — BP 148/72 | Resp 16

## 2021-11-10 DIAGNOSIS — N186 End stage renal disease: Secondary | ICD-10-CM | POA: Diagnosis not present

## 2021-11-10 DIAGNOSIS — F17200 Nicotine dependence, unspecified, uncomplicated: Secondary | ICD-10-CM

## 2021-11-10 DIAGNOSIS — Z992 Dependence on renal dialysis: Secondary | ICD-10-CM

## 2021-11-10 DIAGNOSIS — Z95828 Presence of other vascular implants and grafts: Secondary | ICD-10-CM | POA: Diagnosis not present

## 2021-11-10 DIAGNOSIS — I10 Essential (primary) hypertension: Secondary | ICD-10-CM

## 2021-11-11 ENCOUNTER — Telehealth (INDEPENDENT_AMBULATORY_CARE_PROVIDER_SITE_OTHER): Payer: Self-pay

## 2021-11-11 NOTE — Telephone Encounter (Signed)
I spoke with Will at [facility] and the patient is scheduled with Dr. Gilda Crease on 11/15/21 with a 9:00 am arrival time to the [facility] for a permcath removal. Pre-procedure instructions will be faxed to Will at [facility].

## 2021-11-15 ENCOUNTER — Ambulatory Visit
Admission: RE | Admit: 2021-11-15 | Discharge: 2021-11-15 | Disposition: A | Discharge status: Home / Self Care | Payer: Medicaid Other | Attending: Vascular Surgery | Admitting: Vascular Surgery

## 2021-11-15 ENCOUNTER — Encounter
Admission: RE | Disposition: A | Discharge status: Home / Self Care | Payer: Self-pay | Source: Home / Self Care | Attending: Vascular Surgery

## 2021-11-15 ENCOUNTER — Encounter: Payer: Self-pay | Admitting: Vascular Surgery

## 2021-11-15 DIAGNOSIS — I5022 Chronic systolic (congestive) heart failure: Secondary | ICD-10-CM | POA: Diagnosis not present

## 2021-11-15 DIAGNOSIS — Z4901 Encounter for fitting and adjustment of extracorporeal dialysis catheter: Secondary | ICD-10-CM | POA: Diagnosis not present

## 2021-11-15 DIAGNOSIS — F17210 Nicotine dependence, cigarettes, uncomplicated: Secondary | ICD-10-CM | POA: Diagnosis not present

## 2021-11-15 DIAGNOSIS — T8249XA Other complication of vascular dialysis catheter, initial encounter: Secondary | ICD-10-CM | POA: Diagnosis not present

## 2021-11-15 DIAGNOSIS — I132 Hypertensive heart and chronic kidney disease with heart failure and with stage 5 chronic kidney disease, or end stage renal disease: Secondary | ICD-10-CM | POA: Insufficient documentation

## 2021-11-15 DIAGNOSIS — N186 End stage renal disease: Secondary | ICD-10-CM | POA: Diagnosis not present

## 2021-11-15 HISTORY — PX: DIALYSIS/PERMA CATHETER REMOVAL: CATH118289

## 2021-11-15 SURGERY — DIALYSIS/PERMA CATHETER REMOVAL
Anesthesia: LOCAL

## 2021-11-15 MED ORDER — LIDOCAINE HCL (PF) 1 % IJ SOLN
INTRAMUSCULAR | Status: DC | PRN
  Administered 2021-11-15: 10 mL via INTRADERMAL

## 2021-11-15 SURGICAL SUPPLY — 2 items
FORCEPS HALSTEAD CVD 5IN STRL (INSTRUMENTS) ×1 IMPLANT
TRAY LACERAT/PLASTIC (MISCELLANEOUS) ×1 IMPLANT
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
## 2021-11-15 NOTE — Op Note (Signed)
  OPERATIVE NOTE   PROCEDURE: Removal of a right IJ tunneled dialysis catheter  PRE-OPERATIVE DIAGNOSIS: Complication of dialysis catheter  POST-OPERATIVE DIAGNOSIS: Same  SURGEON: Levora Dredge  ANESTHESIA: Local anesthetic with 1% lidocaine with epinephrine   ESTIMATED BLOOD LOSS: Minimal   FINDING(S): 1. Catheter intact   SPECIMEN(S):  Catheter  INDICATIONS:   Kamarrion Schoessow is a 51 y.o. male who presents with a functioning left arm brachial axillary AV graft.  He is therefore undergoing removal of his tunneled catheter.  Risks and benefits were reviewed all questions answered patient has agreed to proceed.  DESCRIPTION: After obtaining full informed written consent, the patient was positioned supine. The right IJ tunneled catheter and surrounding area is prepped and draped in a sterile fashion. The cuff was localized by palpation and noted to be greater than 3 cm from the exit site. After appropriate timeout is called, 1% lidocaine with epinephrine is infiltrated into the surrounding tissues around the cuff. Small transverse incision is created with an 11 blade scalpel and the dissection was carried down to expose the cuff of the tunneled catheter.  The catheter is then freed from the surrounding attachments and adhesions. Once the catheter has been freed circumferentially it is transected just distal to the cuff and subsequently removed in 2 pieces. Light pressure was held at the base of the neck. A 4-0 Monocryl was used close the tunnel in the subcutaneous space. The 4-0 Monocryl Monocryl was then used to close the skin in a subcuticular stitch. Dermabond is applied.  Antibiotic ointment and a sterile dressing is applied to the exit site. Patient tolerated procedure well and there were no complications.  COMPLICATIONS: None  CONDITION: Unchanged  Levora Dredge. [facility] Office: [phone]  11/15/2021,11:34 AM

## 2021-11-22 ENCOUNTER — Encounter (INDEPENDENT_AMBULATORY_CARE_PROVIDER_SITE_OTHER): Payer: Self-pay | Admitting: Nurse Practitioner
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
## 2021-12-02 ENCOUNTER — Emergency Department
Admission: EM | Admit: 2021-12-02 | Discharge: 2021-12-03 | Disposition: A | Discharge status: Home / Self Care | Payer: Medicaid Other | Attending: Emergency Medicine | Admitting: Emergency Medicine

## 2021-12-02 ENCOUNTER — Other Ambulatory Visit: Payer: Self-pay

## 2021-12-02 DIAGNOSIS — I509 Heart failure, unspecified: Secondary | ICD-10-CM | POA: Diagnosis not present

## 2021-12-02 DIAGNOSIS — Z992 Dependence on renal dialysis: Secondary | ICD-10-CM | POA: Diagnosis not present

## 2021-12-02 DIAGNOSIS — R531 Weakness: Secondary | ICD-10-CM | POA: Insufficient documentation

## 2021-12-02 DIAGNOSIS — N186 End stage renal disease: Secondary | ICD-10-CM | POA: Diagnosis not present

## 2021-12-02 DIAGNOSIS — I132 Hypertensive heart and chronic kidney disease with heart failure and with stage 5 chronic kidney disease, or end stage renal disease: Secondary | ICD-10-CM | POA: Diagnosis not present

## 2021-12-02 LAB — BASIC METABOLIC PANEL
Anion gap: 11 (ref 5–15)
BUN: 15 mg/dL (ref 6–20)
CO2: 32 mmol/L (ref 22–32)
Calcium: 8.2 mg/dL — ABNORMAL LOW (ref 8.9–10.3)
Chloride: 98 mmol/L (ref 98–111)
Creatinine, Ser: 3.29 mg/dL — ABNORMAL HIGH (ref 0.61–1.24)
GFR, Estimated: 22 mL/min — ABNORMAL LOW (ref >60)
Glucose, Bld: 97 mg/dL (ref 70–99)
Potassium: 3.8 mmol/L (ref 3.5–5.1)
Sodium: 141 mmol/L (ref 135–145)

## 2021-12-02 LAB — CBC WITH DIFFERENTIAL/PLATELET
Abs Immature Granulocytes: 0.03 10*3/uL (ref 0.00–0.07)
Basophils Absolute: 0.1 10*3/uL (ref 0.0–0.1)
Basophils Relative: 1 %
Eosinophils Absolute: 0.2 10*3/uL (ref 0.0–0.5)
Eosinophils Relative: 3 %
HCT: 41.1 % (ref 39.0–52.0)
Hemoglobin: 12.4 g/dL — ABNORMAL LOW (ref 13.0–17.0)
Immature Granulocytes: 0 %
Lymphocytes Relative: 13 %
Lymphs Abs: 1 10*3/uL (ref 0.7–4.0)
MCH: 29.5 pg (ref 26.0–34.0)
MCHC: 30.2 g/dL (ref 30.0–36.0)
MCV: 97.6 fL (ref 80.0–100.0)
Monocytes Absolute: 0.6 10*3/uL (ref 0.1–1.0)
Monocytes Relative: 7 %
Neutro Abs: 5.6 10*3/uL (ref 1.7–7.7)
Neutrophils Relative %: 76 %
Platelets: 191 10*3/uL (ref 150–400)
RBC: 4.21 MIL/uL — ABNORMAL LOW (ref 4.22–5.81)
RDW: 14.9 % (ref 11.5–15.5)
WBC: 7.5 10*3/uL (ref 4.0–10.5)
nRBC: 0 % (ref 0.0–0.2)
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
## 2021-12-02 NOTE — ED Notes (Signed)
Report given to Tom RN.

## 2021-12-02 NOTE — ED Notes (Signed)
Call placed by ED secretary Melody to [employer] to request a call back from DSS. Tom RN aware. Address is correct per patient.

## 2021-12-02 NOTE — ED Provider Notes (Signed)
[facility] Emergency Department Provider Note     Event Date/Time   First MD Initiated Contact with Patient 12/02/21 2041     (approximate)   History   Weakness   HPI  Larry Morrison is a 51 y.o. male with hypertension, CHF, ESRD, on dialysis MWF dialysis presents to the ED via EMS from home.  Patient was recently determined to be a ward of the state, and his DSS worker visited today, noted that he seemed weaker than normal after having routine dialysis today.  The patient himself is without complaint at this time, noted that yes he is typically tired after his dialysis treatment.  His only complaint right now is that he is hungry.   Physical Exam   Triage Vital Signs: ED Triage Vitals  Enc Vitals Group     BP 12/02/21 1835 117/70     Pulse Rate 12/02/21 1835 92     Resp 12/02/21 1835 16     Temp 12/02/21 1835 98.3 F (36.8 C)     Temp Source 12/02/21 1835 Oral     SpO2 12/02/21 1835 96 %     Weight 12/02/21 1836 125 lb (56.7 kg)     Height 12/02/21 1836 5' (1.524 m)     Head Circumference --      Peak Flow --      Pain Score 12/02/21 1836 0     Pain Loc --      Pain Edu? --      Excl. in [place]? --     Most recent vital signs: Vitals:   12/02/21 2223 12/03/21 0037  BP: 120/71 (!) 148/80  Pulse: 93 90  Resp: 18 18  Temp:    SpO2: 97% 99%    General Awake, no distress.  CV:  Good peripheral perfusion.  RESP:  Normal effort.  ABD:  No distention.    ED Results / Procedures / Treatments   Labs (all labs ordered are listed, but only abnormal results are displayed) Labs Reviewed  CBC WITH DIFFERENTIAL/PLATELET - Abnormal; Notable for the following components:      Result Value   RBC 4.21 (*)    Hemoglobin 12.4 (*)    All other components within normal limits  BASIC METABOLIC PANEL - Abnormal; Notable for the following components:   Creatinine, Ser 3.29 (*)    Calcium 8.2 (*)    GFR, Estimated 22 (*)    All other components  within normal limits     EKG   RADIOLOGY   No results found.   PROCEDURES:  Critical Care performed: No  Procedures   MEDICATIONS ORDERED IN ED: Medications - No data to display   IMPRESSION / MDM / [place] / ED COURSE  I reviewed the triage vital signs and the nursing notes.                              Differential diagnosis includes, but is not limited to,infection, sepsis, dehydration, hyperkalemia  Patient's presentation is most consistent with acute complicated illness / injury requiring diagnostic workup.  Patient to the ED following dialysis, without complaint.  He used presenting after his home health worker evaluated him today and thought he seemed more weak or tired than usual.  Patient was evaluated in the ED with routine labs which did not reveal any acute abnormalities outside of his CKD.  Patient labs are stable following routine  hemodialysis today.  Patient's diagnosis is consistent with weakness status post dialysis. Patient will be discharged home with directions to follow-up with her primary provider.    FINAL CLINICAL IMPRESSION(S) / ED DIAGNOSES   Final diagnoses:  Generalized weakness     Rx / DC Orders   ED Discharge Orders     None        Note:  This document was prepared using Dragon voice recognition software and may include unintentional dictation errors.    Melvenia Needles, PA-C 12/03/21 9787    Nance Pear, MD 12/14/21 1504

## 2021-12-02 NOTE — ED Notes (Signed)
Patient given ED sandwich tray and something to drink by Loney Loh Menshew PA-C.

## 2021-12-02 NOTE — ED Provider Triage Note (Signed)
Emergency Medicine Provider Triage Evaluation Note  Larry Morrison , a 51 y.o. male  was evaluated in triage.  Pt denies complaint, but guardian states that he is more weak than usual. He had dialysis today. No known complications.  Physical Exam  There were no vitals taken for this visit. Gen:   Awake, no distress   Resp:  Normal effort  MSK:   Moves extremities without difficulty  Other:    Medical Decision Making  Medically screening exam initiated at 6:32 PM.  Appropriate orders placed.  Glendel Jaggers was informed that the remainder of the evaluation will be completed by another provider, this initial triage assessment does not replace that evaluation, and the importance of remaining in the ED until their evaluation is complete.   Victorino Dike, FNP 12/02/21 1834

## 2021-12-03 NOTE — Discharge Instructions (Signed)
Your exam and labs overall reassuring considering you had dialysis today.  Continue to monitor your fluid intake and follow-up with primary provider as needed.

## 2021-12-03 NOTE — ED Notes (Signed)
Spoke to DSS agent. Pt's legal guardian is off shift and permission was given to transport him home via EMS. Pt's appointed guardian is Larry Morrison, Larry Morrison [phone].

## 2021-12-16 ENCOUNTER — Telehealth (INDEPENDENT_AMBULATORY_CARE_PROVIDER_SITE_OTHER): Payer: Self-pay

## 2021-12-16 NOTE — Telephone Encounter (Signed)
Received a call and fax from Will at [facility] to schedule the patient for a left arm declot. Patient is scheduled for 12/19/21 with a 1:15 pm arrival time to the MM with Dr. Lucky Cowboy. Pre-procedure instructions will be faxed to Will at [facility].

## 2021-12-19 ENCOUNTER — Encounter: Payer: Self-pay | Admitting: Vascular Surgery

## 2021-12-19 ENCOUNTER — Other Ambulatory Visit: Payer: Self-pay

## 2021-12-19 ENCOUNTER — Ambulatory Visit
Admission: RE | Admit: 2021-12-19 | Discharge: 2021-12-19 | Disposition: A | Discharge status: Home / Self Care | Payer: Medicaid Other | Attending: Vascular Surgery | Admitting: Vascular Surgery

## 2021-12-19 ENCOUNTER — Encounter
Admission: RE | Disposition: A | Discharge status: Home / Self Care | Payer: Self-pay | Source: Home / Self Care | Attending: Vascular Surgery

## 2021-12-19 DIAGNOSIS — E11649 Type 2 diabetes mellitus with hypoglycemia without coma: Secondary | ICD-10-CM | POA: Insufficient documentation

## 2021-12-19 DIAGNOSIS — I132 Hypertensive heart and chronic kidney disease with heart failure and with stage 5 chronic kidney disease, or end stage renal disease: Secondary | ICD-10-CM | POA: Insufficient documentation

## 2021-12-19 DIAGNOSIS — Z992 Dependence on renal dialysis: Secondary | ICD-10-CM

## 2021-12-19 DIAGNOSIS — I120 Hypertensive chronic kidney disease with stage 5 chronic kidney disease or end stage renal disease: Secondary | ICD-10-CM

## 2021-12-19 DIAGNOSIS — N186 End stage renal disease: Secondary | ICD-10-CM | POA: Diagnosis not present

## 2021-12-19 DIAGNOSIS — T83092A Other mechanical complication of nephrostomy catheter, initial encounter: Secondary | ICD-10-CM

## 2021-12-19 DIAGNOSIS — T82858A Stenosis of vascular prosthetic devices, implants and grafts, initial encounter: Secondary | ICD-10-CM

## 2021-12-19 DIAGNOSIS — T82868A Thrombosis of vascular prosthetic devices, implants and grafts, initial encounter: Secondary | ICD-10-CM | POA: Diagnosis not present

## 2021-12-19 DIAGNOSIS — E1122 Type 2 diabetes mellitus with diabetic chronic kidney disease: Secondary | ICD-10-CM | POA: Insufficient documentation

## 2021-12-19 HISTORY — PX: PERIPHERAL VASCULAR THROMBECTOMY: CATH118306

## 2021-12-19 LAB — POTASSIUM (ARMC VASCULAR LAB ONLY): Potassium (ARMC vascular lab): 5.5 mmol/L — ABNORMAL HIGH (ref 3.5–5.1)

## 2021-12-19 SURGERY — PERIPHERAL VASCULAR THROMBECTOMY
Anesthesia: Moderate Sedation | Laterality: Left

## 2021-12-19 MED ORDER — MIDAZOLAM HCL 2 MG/2ML IJ SOLN
INTRAMUSCULAR | Status: DC | PRN
  Administered 2021-12-19 (×2): 1 mg via INTRAVENOUS

## 2021-12-19 MED ORDER — CEFAZOLIN SODIUM-DEXTROSE 2-4 GM/100ML-% IV SOLN: 2.0000 g | INTRAVENOUS | Status: DC

## 2021-12-19 MED ORDER — ALTEPLASE 1 MG/ML SYRINGE FOR VASCULAR PROCEDURE
INTRAMUSCULAR | Status: DC | PRN
  Administered 2021-12-19: 4 mg via INTRA_ARTERIAL

## 2021-12-19 MED ORDER — CEFAZOLIN SODIUM-DEXTROSE 1-4 GM/50ML-% IV SOLN
INTRAVENOUS | Status: AC
  Administered 2021-12-19: 1 g via INTRAVENOUS
  Filled 2021-12-19: qty 50

## 2021-12-19 MED ORDER — SODIUM CHLORIDE 0.9 % IV SOLN: INTRAVENOUS | Status: DC

## 2021-12-19 MED ORDER — FENTANYL CITRATE PF 50 MCG/ML IJ SOSY
PREFILLED_SYRINGE | INTRAMUSCULAR | Status: AC
  Filled 2021-12-19: qty 2

## 2021-12-19 MED ORDER — CEFAZOLIN SODIUM-DEXTROSE 1-4 GM/50ML-% IV SOLN: 1.0000 g | INTRAVENOUS | Status: AC

## 2021-12-19 MED ORDER — ALTEPLASE 2 MG IJ SOLR
INTRAMUSCULAR | Status: AC
  Filled 2021-12-19: qty 4

## 2021-12-19 MED ORDER — HYDROMORPHONE HCL 1 MG/ML IJ SOLN: 1.0000 mg | Freq: Once | INTRAMUSCULAR | Status: DC | PRN

## 2021-12-19 MED ORDER — IODIXANOL 320 MG/ML IV SOLN
INTRAVENOUS | Status: DC | PRN
  Administered 2021-12-19: 35 mL via INTRAVENOUS

## 2021-12-19 MED ORDER — FAMOTIDINE 20 MG PO TABS
40.0000 mg | ORAL_TABLET | Freq: Once | ORAL | Status: DC | PRN
Start: 2021-12-19 — End: 2021-12-19

## 2021-12-19 MED ORDER — METHYLPREDNISOLONE SODIUM SUCC 125 MG IJ SOLR: 125.0000 mg | Freq: Once | INTRAMUSCULAR | Status: DC | PRN

## 2021-12-19 MED ORDER — FENTANYL CITRATE (PF) 100 MCG/2ML IJ SOLN
INTRAMUSCULAR | Status: DC | PRN
  Administered 2021-12-19: 50 ug via INTRAVENOUS
  Administered 2021-12-19: 25 ug via INTRAVENOUS

## 2021-12-19 MED ORDER — DIPHENHYDRAMINE HCL 50 MG/ML IJ SOLN: 50.0000 mg | Freq: Once | INTRAMUSCULAR | Status: DC | PRN

## 2021-12-19 MED ORDER — HEPARIN SODIUM (PORCINE) 1000 UNIT/ML IJ SOLN
INTRAMUSCULAR | Status: DC | PRN
  Administered 2021-12-19: 3000 [IU] via INTRAVENOUS

## 2021-12-19 MED ORDER — MIDAZOLAM HCL 2 MG/2ML IJ SOLN
INTRAMUSCULAR | Status: DC
Start: 2021-12-19 — End: 2021-12-19
  Filled 2021-12-19: qty 4

## 2021-12-19 MED ORDER — MIDAZOLAM HCL 2 MG/ML PO SYRP: 8.0000 mg | ORAL_SOLUTION | Freq: Once | ORAL | Status: DC | PRN

## 2021-12-19 SURGICAL SUPPLY — 18 items
BALLN LUTONIX 7X100X130 (BALLOONS) ×2
BALLOON LUTONIX 7X100X130 (BALLOONS) IMPLANT
CANNULA 5F STIFF (CANNULA) ×1 IMPLANT
CATH BEACON 5 .035 40 KMP TP (CATHETERS) IMPLANT
CATH BEACON 5 .038 40 KMP TP (CATHETERS) ×1
CATH EMBOLECTOMY 5FR (BALLOONS) ×1 IMPLANT
CATH THROMBEC CLEANERXT 6X65 (CATHETERS) ×1 IMPLANT
COVER PROBE U/S 5X48 (MISCELLANEOUS) ×1 IMPLANT
DRAPE BRACHIAL (DRAPES) ×1 IMPLANT
GLIDEWIRE ADV .035X180CM (WIRE) ×1 IMPLANT
KIT ENCORE 26 ADVANTAGE (KITS) ×1 IMPLANT
PACK ANGIOGRAPHY (CUSTOM PROCEDURE TRAY) ×2 IMPLANT
SHEATH BRITE TIP 6FRX5.5 (SHEATH) ×2 IMPLANT
SHEATH BRITE TIP 7FRX5.5 (SHEATH) ×1 IMPLANT
STENT VIABAHN 8X100X120 (Permanent Stent) ×1 IMPLANT
STENT VIABAHN 8X10X120 (Permanent Stent) IMPLANT
SUT MNCRL AB 4-0 PS2 18 (SUTURE) ×1 IMPLANT
WIRE G 018X200 V18 (WIRE) ×1 IMPLANT
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
## 2021-12-19 NOTE — Progress Notes (Signed)
Spoke with Bishop Limbo, RN: she states that " I help coordinate appts. And transportation for the pt." Brandy made aware that pt. Needs to have his neph tube replaced as not changed per pt. "Since April." Schedulers given Brandy's phone # to coordinate neph. Tube change out in future since pt. Lives at group home [facility] care). Right lat. Neph. Tube drsg. Changed by A. Sharlett Iles, RN: no redness, drainage around tube, ecchymosis or hematoma around tube.

## 2021-12-19 NOTE — Progress Notes (Signed)
MD made aware of K+ 5.5; wishes to proceed with procedure.

## 2021-12-19 NOTE — Op Note (Signed)
[facility]    OPERATIVE NOTE   PROCEDURE: 1.  Left brachial artery to axillary vein arteriovenous graft cannulation under ultrasound guidance in both a retrograde and then antegrade fashion crossing 2.  Left arm shuntogram and central venogram 3.  Catheter directed thrombolysis with 4 mg of TPA  4.  Mechanical thrombectomy to the left brachial artery to axillary vein AV graft with the cleaner device 5.  Fogarty embolectomy for residual arterial plug 6.  Stent placement to the venous anastomosis and axillary vein with 8 mm diameter by 10 cm length stent  PRE-OPERATIVE DIAGNOSIS: 1. ESRD 2.  Thrombosed left brachial artery to axillary vein arteriovenous graft  POST-OPERATIVE DIAGNOSIS: same as above   SURGEON: Leotis Pain, MD  ANESTHESIA: local with Moderate Conscious Sedation for approximately 48 minutes using 2 mg of Versed and 75 mcg of Fentanyl  ESTIMATED BLOOD LOSS: 20 cc  FINDING(S): Occlusive stenosis of the AV graft to the axillary vein of greater than 95%  SPECIMEN(S):  None  CONTRAST: 35 cc  FLUORO TIME: 7.9 minutes  INDICATIONS: Patient is a 51 y.o.male who presents with a thrombosed left brachial artery to axillary vein arteriovenous graft.  The patient is scheduled for an attempted declot and shuntogram.  The patient is aware the risks include but are not limited to: bleeding, infection, thrombosis of the cannulated access, and possible anaphylactic reaction to the contrast.  The patient is aware of the risks of the procedure and elects to proceed forward.  DESCRIPTION: After full informed written consent was obtained, the patient was brought back to the angiography suite and placed supine upon the angiography table.  The patient was connected to monitoring equipment. Moderate conscious sedation was administered with a face to face encounter with the patient throughout the procedure with my supervision of the RN administering medicines and  monitoring the patient's vital signs, pulse oximetry, telemetry and mental status throughout from the start of the procedure until the patient was taken to the recovery room. The left arm was prepped and draped in the standard fashion for a percutaneous access intervention.  Under ultrasound guidance, the left brachial artery to axillary vein arteriovenous graft was cannulated with a micropuncture needle under direct ultrasound guidance due to the pulseless nature of the graft in both an antegrade and a retrograde fashion crossing, and permanent images were performed.  The microwire was advanced and the needle was exchanged for the a microsheath.  I then upsized to a 6 Fr Sheath and imaging was performed.  Hand injections were completed to image the access including the central venous system. This demonstrated no flow within the AV graft.  Based on the images, this patient will need extensive treatment to salvage the graft. I then gave the patient 3000 units of intravenous heparin.  I then placed an Advantage wire into the brachial artery from the retrograde sheath and into the axillary vein from the antegrade sheath. 4 mg of TPA were deployed. This was allowed to dwell. Mechanical thrombectomy using the Cleaner device was then performed throughout the graft and into the axillary vein. This uncovered a near occlusive stenosis of the venous anastomosis to the axillary vein.  A residual arterial plug was also seen at the arterial anastomosis. An attempt to clear the arterial plug was done with 3 passes of the Fogarty embolectomy balloon. This resulted in resolution of the arterial plug, and clearance of the arterial side of the graft. The arterial outflow was seen to be  intact distally. The retrograde sheath was removed. I then turned my attention to the thrombus in the distal graft and the axillary vein. Mechanical thrombectomy was performed again with the cleaner device. This resulted in clearance of thrombus but  there remained a high-grade stenosis at the venous anastomosis to the axillary vein.  I then elected to treat this with an 8 mm diameter by 10 cm length Viabahn stent.  After upsizing to a 7 [place] sheath and exchanged for a 0.018 wire, this was deployed across the venous anastomosis and then postdilated with a 7 mm diameter by 10 cm length Lutonix drug-coated angioplasty balloon inflated to 12 atm.  This resulted in markedly improved flow throughout the graft without significant residual stenosis across the venous anastomosis within the stented area.   Based on the completion imaging, no further intervention is necessary.  The wire and balloon were removed from the sheath.  A 4-0 Monocryl purse-string suture was sewn around the sheath.  The sheath was removed while tying down the suture.  A sterile bandage was applied to the puncture site.  COMPLICATIONS: None  CONDITION: Stable   Leotis Pain 12/19/2021 3:28 PM   This note was created with Dragon Medical transcription system. Any errors in dictation are purely unintentional.

## 2021-12-20 ENCOUNTER — Encounter: Payer: Self-pay | Admitting: Vascular Surgery

## 2021-12-22 ENCOUNTER — Ambulatory Visit
Admission: RE | Admit: 2021-12-22 | Discharge: 2021-12-22 | Disposition: A | Discharge status: Home / Self Care | Payer: Medicaid Other | Source: Ambulatory Visit | Attending: Radiology | Admitting: Radiology

## 2021-12-22 ENCOUNTER — Other Ambulatory Visit: Payer: Self-pay | Admitting: Radiology

## 2021-12-22 DIAGNOSIS — Y828 Other medical devices associated with adverse incidents: Secondary | ICD-10-CM | POA: Insufficient documentation

## 2021-12-22 DIAGNOSIS — T83092A Other mechanical complication of nephrostomy catheter, initial encounter: Secondary | ICD-10-CM

## 2021-12-22 HISTORY — PX: IR NEPHROSTOMY EXCHANGE RIGHT: IMG6070

## 2021-12-22 MED ORDER — IOHEXOL 350 MG/ML SOLN
4.0000 mL | Freq: Once | INTRAVENOUS | Status: AC | PRN
  Administered 2021-12-22: 4 mL

## 2021-12-22 MED ORDER — LIDOCAINE HCL 1 % IJ SOLN
INTRAMUSCULAR | Status: AC
  Administered 2021-12-22: 2 mL
  Filled 2021-12-22: qty 20

## 2021-12-22 NOTE — Procedures (Signed)
Interventional Radiology Procedure Note  Procedure: FLUORO RT PCN EXCHG    Complications: None  Estimated Blood Loss:  0  Findings: 10 FR FULL REPORT IN PACS     Tamera Punt, MD

## 2022-01-30 ENCOUNTER — Other Ambulatory Visit (INDEPENDENT_AMBULATORY_CARE_PROVIDER_SITE_OTHER): Payer: Self-pay | Admitting: Vascular Surgery

## 2022-01-30 DIAGNOSIS — N186 End stage renal disease: Secondary | ICD-10-CM

## 2022-01-30 DIAGNOSIS — Z95820 Peripheral vascular angioplasty status with implants and grafts: Secondary | ICD-10-CM

## 2022-01-31 ENCOUNTER — Encounter (INDEPENDENT_AMBULATORY_CARE_PROVIDER_SITE_OTHER): Payer: Medicaid Other

## 2022-01-31 ENCOUNTER — Ambulatory Visit (INDEPENDENT_AMBULATORY_CARE_PROVIDER_SITE_OTHER): Payer: Medicaid Other | Admitting: Nurse Practitioner

## 2022-02-07 ENCOUNTER — Ambulatory Visit (INDEPENDENT_AMBULATORY_CARE_PROVIDER_SITE_OTHER): Payer: Medicaid Other | Admitting: Nurse Practitioner

## 2022-02-13 ENCOUNTER — Other Ambulatory Visit (HOSPITAL_COMMUNITY): Payer: Self-pay | Admitting: Radiology

## 2022-02-14 ENCOUNTER — Ambulatory Visit: Admission: RE | Admit: 2022-02-14 | Payer: Medicaid Other | Source: Ambulatory Visit | Admitting: Radiology
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
## 2022-03-21 ENCOUNTER — Ambulatory Visit (INDEPENDENT_AMBULATORY_CARE_PROVIDER_SITE_OTHER): Payer: Medicaid Other | Admitting: Urology

## 2022-03-21 VITALS — BP 147/70 | HR 79 | Ht 60.0 in | Wt 125.0 lb

## 2022-03-21 DIAGNOSIS — Z936 Other artificial openings of urinary tract status: Secondary | ICD-10-CM | POA: Diagnosis not present

## 2022-03-21 DIAGNOSIS — N139 Obstructive and reflux uropathy, unspecified: Secondary | ICD-10-CM | POA: Diagnosis not present
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
## 2022-05-11 ENCOUNTER — Ambulatory Visit (INDEPENDENT_AMBULATORY_CARE_PROVIDER_SITE_OTHER): Payer: Medicaid Other | Admitting: Vascular Surgery

## 2022-05-11 ENCOUNTER — Encounter (INDEPENDENT_AMBULATORY_CARE_PROVIDER_SITE_OTHER): Payer: Medicaid Other

## 2022-05-11 DIAGNOSIS — N186 End stage renal disease: Secondary | ICD-10-CM

## 2022-05-11 DIAGNOSIS — I10 Essential (primary) hypertension: Secondary | ICD-10-CM

## 2022-07-05 DIAGNOSIS — E785 Hyperlipidemia, unspecified: Secondary | ICD-10-CM | POA: Insufficient documentation
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
## 2022-07-06 ENCOUNTER — Ambulatory Visit (INDEPENDENT_AMBULATORY_CARE_PROVIDER_SITE_OTHER): Payer: Medicaid Other | Admitting: Vascular Surgery

## 2022-07-06 ENCOUNTER — Ambulatory Visit (INDEPENDENT_AMBULATORY_CARE_PROVIDER_SITE_OTHER): Payer: Medicaid Other

## 2022-07-06 ENCOUNTER — Encounter (INDEPENDENT_AMBULATORY_CARE_PROVIDER_SITE_OTHER): Payer: Self-pay | Admitting: Vascular Surgery

## 2022-07-06 VITALS — BP 136/69 | HR 78 | Resp 15

## 2022-07-06 DIAGNOSIS — Z992 Dependence on renal dialysis: Secondary | ICD-10-CM

## 2022-07-06 DIAGNOSIS — E782 Mixed hyperlipidemia: Secondary | ICD-10-CM

## 2022-07-06 DIAGNOSIS — N186 End stage renal disease: Secondary | ICD-10-CM

## 2022-07-06 DIAGNOSIS — I5043 Acute on chronic combined systolic (congestive) and diastolic (congestive) heart failure: Secondary | ICD-10-CM

## 2022-07-06 DIAGNOSIS — I10 Essential (primary) hypertension: Secondary | ICD-10-CM | POA: Diagnosis not present

## 2022-07-06 DIAGNOSIS — Z95820 Peripheral vascular angioplasty status with implants and grafts: Secondary | ICD-10-CM

## 2022-07-15 ENCOUNTER — Encounter (INDEPENDENT_AMBULATORY_CARE_PROVIDER_SITE_OTHER): Payer: Self-pay | Admitting: Vascular Surgery

## 2022-07-27 ENCOUNTER — Telehealth: Payer: Self-pay

## 2022-07-27 NOTE — Telephone Encounter (Signed)
Patient is scheduled for Neph tube exchange on 08/08/2022 at 10:00.

## 2022-07-27 NOTE — Telephone Encounter (Signed)
I spoke to Allen at [employer] and she is working on getting him scheduled. I reached out to [place] and let her know Marcie Bal from IR is going to reach out to set up a appointment.

## 2022-07-27 NOTE — Telephone Encounter (Signed)
Nurse wants to know if IR is to contact pt to exchange his nephrostomy tube. She states she called yesterday. No telephone note in pts chart.   She states this is suppose to be changed q 3 months.   Pls contact Brandy as she sets up pts appts for pt.   CG - was aware and is in contact with IR. Will forward to CG to call Brandy.

## 2022-08-04 ENCOUNTER — Other Ambulatory Visit: Payer: Self-pay | Admitting: Nurse Practitioner

## 2022-08-04 ENCOUNTER — Ambulatory Visit: Payer: Self-pay

## 2022-08-04 DIAGNOSIS — Z0000 Encounter for general adult medical examination without abnormal findings: Secondary | ICD-10-CM

## 2022-08-07 NOTE — Progress Notes (Signed)
Patient for IR Neph Tube Exchange on Tues 08/08/2022, I called and spoke with the patient's caretaker- Velna Hatchet on the phone and gave pre-procedure instructions. Velna Hatchet  was made aware to have the pt here at 9:30a. Velna Hatchet stated understanding.  Called 03/18/20204

## 2022-08-08 ENCOUNTER — Other Ambulatory Visit: Payer: Self-pay | Admitting: Radiology

## 2022-08-08 ENCOUNTER — Ambulatory Visit
Admission: RE | Admit: 2022-08-08 | Discharge: 2022-08-08 | Disposition: A | Discharge status: Home / Self Care | Payer: Medicaid Other | Source: Ambulatory Visit | Attending: Interventional Radiology | Admitting: Interventional Radiology

## 2022-08-08 DIAGNOSIS — Y849 Medical procedure, unspecified as the cause of abnormal reaction of the patient, or of later complication, without mention of misadventure at the time of the procedure: Secondary | ICD-10-CM | POA: Insufficient documentation

## 2022-08-08 DIAGNOSIS — T83092A Other mechanical complication of nephrostomy catheter, initial encounter: Secondary | ICD-10-CM

## 2022-08-08 HISTORY — PX: IR NEPHROSTOMY EXCHANGE RIGHT: IMG6070

## 2022-08-08 MED ORDER — IOPAMIDOL (ISOVUE-300) INJECTION 61%
12.0000 mL | Freq: Once | INTRAVENOUS | Status: AC | PRN
  Administered 2022-08-08: 12 mL

## 2022-08-08 MED ORDER — LIDOCAINE HCL 1 % IJ SOLN
INTRAMUSCULAR | Status: AC
  Administered 2022-08-08: 10 mL
  Filled 2022-08-08: qty 20
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
## 2022-11-06 ENCOUNTER — Other Ambulatory Visit (HOSPITAL_COMMUNITY): Payer: Self-pay | Admitting: Student

## 2022-11-06 NOTE — Progress Notes (Signed)
Patient for IR RT Neph Tube Exchange on Tues 11/07/2022, I called and spoke with Merry Proud, his caretaker on the phone and gave pre-procedure instructions. Merry Proud was made aware to have the patient here at 9a, NPO after MN prior to procedure as well as driver post procedure/recovery/discharge. Merry Proud stated understanding.  Called 11/06/2022

## 2022-11-07 ENCOUNTER — Encounter: Payer: Self-pay | Admitting: Radiology

## 2022-11-07 ENCOUNTER — Ambulatory Visit
Admission: RE | Admit: 2022-11-07 | Discharge: 2022-11-07 | Disposition: A | Discharge status: Home / Self Care | Payer: Medicaid Other | Source: Ambulatory Visit | Attending: Interventional Radiology | Admitting: Interventional Radiology

## 2022-11-07 DIAGNOSIS — I5022 Chronic systolic (congestive) heart failure: Secondary | ICD-10-CM | POA: Insufficient documentation

## 2022-11-07 DIAGNOSIS — T83092A Other mechanical complication of nephrostomy catheter, initial encounter: Secondary | ICD-10-CM

## 2022-11-07 DIAGNOSIS — Z8674 Personal history of sudden cardiac arrest: Secondary | ICD-10-CM | POA: Diagnosis not present

## 2022-11-07 DIAGNOSIS — N200 Calculus of kidney: Secondary | ICD-10-CM | POA: Diagnosis not present

## 2022-11-07 DIAGNOSIS — I132 Hypertensive heart and chronic kidney disease with heart failure and with stage 5 chronic kidney disease, or end stage renal disease: Secondary | ICD-10-CM | POA: Insufficient documentation

## 2022-11-07 DIAGNOSIS — Z905 Acquired absence of kidney: Secondary | ICD-10-CM | POA: Diagnosis not present

## 2022-11-07 DIAGNOSIS — Z436 Encounter for attention to other artificial openings of urinary tract: Secondary | ICD-10-CM | POA: Insufficient documentation

## 2022-11-07 DIAGNOSIS — N319 Neuromuscular dysfunction of bladder, unspecified: Secondary | ICD-10-CM | POA: Diagnosis not present

## 2022-11-07 DIAGNOSIS — F17210 Nicotine dependence, cigarettes, uncomplicated: Secondary | ICD-10-CM | POA: Insufficient documentation

## 2022-11-07 DIAGNOSIS — Z8673 Personal history of transient ischemic attack (TIA), and cerebral infarction without residual deficits: Secondary | ICD-10-CM | POA: Insufficient documentation

## 2022-11-07 DIAGNOSIS — Z992 Dependence on renal dialysis: Secondary | ICD-10-CM | POA: Insufficient documentation

## 2022-11-07 DIAGNOSIS — N186 End stage renal disease: Secondary | ICD-10-CM | POA: Insufficient documentation

## 2022-11-07 DIAGNOSIS — Z993 Dependence on wheelchair: Secondary | ICD-10-CM | POA: Diagnosis not present

## 2022-11-07 HISTORY — PX: IR NEPHROSTOMY EXCHANGE RIGHT: IMG6070

## 2022-11-07 MED ORDER — FENTANYL CITRATE (PF) 100 MCG/2ML IJ SOLN
INTRAMUSCULAR | Status: AC
  Filled 2022-11-07: qty 2

## 2022-11-07 MED ORDER — MIDAZOLAM HCL 2 MG/2ML IJ SOLN
INTRAMUSCULAR | Status: AC
  Filled 2022-11-07: qty 2

## 2022-11-07 MED ORDER — LIDOCAINE HCL 1 % IJ SOLN
5.0000 mL | Freq: Once | INTRAMUSCULAR | Status: AC
  Administered 2022-11-07: 5 mL via INTRADERMAL

## 2022-11-07 MED ORDER — FENTANYL CITRATE (PF) 100 MCG/2ML IJ SOLN
INTRAMUSCULAR | Status: AC | PRN
  Administered 2022-11-07: 25 ug via INTRAVENOUS

## 2022-11-07 MED ORDER — MIDAZOLAM HCL 2 MG/2ML IJ SOLN
INTRAMUSCULAR | Status: AC | PRN
  Administered 2022-11-07: 1 mg via INTRAVENOUS

## 2022-11-07 MED ORDER — IOHEXOL 300 MG/ML  SOLN
10.0000 mL | Freq: Once | INTRAMUSCULAR | Status: AC | PRN
  Administered 2022-11-07: 10 mL

## 2022-11-07 MED ORDER — SODIUM CHLORIDE 0.9 % IV SOLN
INTRAVENOUS | Status: DC
  Administered 2022-11-07: 1000 mL via INTRAVENOUS

## 2022-11-07 MED ORDER — LIDOCAINE HCL 1 % IJ SOLN
INTRAMUSCULAR | Status: AC
  Filled 2022-11-07: qty 20

## 2022-11-07 NOTE — Procedures (Signed)
Interventional Radiology Procedure:   Indications: Chronic right nephrostomy  Procedure: Right nephrostomy tube exchange  Findings: New 10 Fr Renee Pain drain in right renal pelvis  Complications: None     EBL: Minimal  Plan: Plan for routine exchange in 3 months  [place] R. Lowella Dandy, MD  Pager: [phone]

## 2022-12-13 ENCOUNTER — Other Ambulatory Visit: Payer: Self-pay | Admitting: Interventional Radiology

## 2022-12-13 DIAGNOSIS — T83092A Other mechanical complication of nephrostomy catheter, initial encounter: Secondary | ICD-10-CM

## 2023-01-03 ENCOUNTER — Other Ambulatory Visit (INDEPENDENT_AMBULATORY_CARE_PROVIDER_SITE_OTHER): Payer: Self-pay | Admitting: Nurse Practitioner

## 2023-01-03 DIAGNOSIS — N186 End stage renal disease: Secondary | ICD-10-CM

## 2023-01-04 ENCOUNTER — Ambulatory Visit (INDEPENDENT_AMBULATORY_CARE_PROVIDER_SITE_OTHER): Payer: Medicaid Other

## 2023-01-04 ENCOUNTER — Ambulatory Visit (INDEPENDENT_AMBULATORY_CARE_PROVIDER_SITE_OTHER): Payer: Medicaid Other | Admitting: Vascular Surgery

## 2023-01-04 DIAGNOSIS — N186 End stage renal disease: Secondary | ICD-10-CM

## 2023-02-07 NOTE — Progress Notes (Signed)
Patient for IR RT Neph Exchange with mod sed on Thurs 02/08/2023, I called and spoke with Merry Proud the patient's caretaker on the phone and gave pre-procedure instructions. Merry Proud was made aware to have the patient here at 9a, NPO after MN prior to procedure as well as driver post procedure/recovery/discharge. Merry Proud stated understanding.  Called 01/29/2023

## 2023-02-08 ENCOUNTER — Other Ambulatory Visit: Payer: Self-pay

## 2023-02-08 ENCOUNTER — Ambulatory Visit
Admission: RE | Admit: 2023-02-08 | Discharge: 2023-02-08 | Disposition: A | Discharge status: Home / Self Care | Payer: Medicaid Other | Source: Ambulatory Visit | Attending: Interventional Radiology | Admitting: Interventional Radiology

## 2023-02-08 ENCOUNTER — Other Ambulatory Visit: Payer: Self-pay | Admitting: Student

## 2023-02-08 ENCOUNTER — Other Ambulatory Visit: Payer: Self-pay | Admitting: Interventional Radiology

## 2023-02-08 DIAGNOSIS — F17210 Nicotine dependence, cigarettes, uncomplicated: Secondary | ICD-10-CM | POA: Diagnosis not present

## 2023-02-08 DIAGNOSIS — N186 End stage renal disease: Secondary | ICD-10-CM | POA: Diagnosis not present

## 2023-02-08 DIAGNOSIS — I428 Other cardiomyopathies: Secondary | ICD-10-CM | POA: Insufficient documentation

## 2023-02-08 DIAGNOSIS — T83092A Other mechanical complication of nephrostomy catheter, initial encounter: Secondary | ICD-10-CM | POA: Diagnosis present

## 2023-02-08 DIAGNOSIS — I429 Cardiomyopathy, unspecified: Secondary | ICD-10-CM | POA: Diagnosis not present

## 2023-02-08 DIAGNOSIS — I110 Hypertensive heart disease with heart failure: Secondary | ICD-10-CM | POA: Insufficient documentation

## 2023-02-08 DIAGNOSIS — I5022 Chronic systolic (congestive) heart failure: Secondary | ICD-10-CM | POA: Diagnosis not present

## 2023-02-08 DIAGNOSIS — Z8674 Personal history of sudden cardiac arrest: Secondary | ICD-10-CM | POA: Diagnosis not present

## 2023-02-08 DIAGNOSIS — I132 Hypertensive heart and chronic kidney disease with heart failure and with stage 5 chronic kidney disease, or end stage renal disease: Secondary | ICD-10-CM | POA: Diagnosis not present

## 2023-02-08 DIAGNOSIS — Y839 Surgical procedure, unspecified as the cause of abnormal reaction of the patient, or of later complication, without mention of misadventure at the time of the procedure: Secondary | ICD-10-CM | POA: Diagnosis not present

## 2023-02-08 DIAGNOSIS — Z905 Acquired absence of kidney: Secondary | ICD-10-CM | POA: Diagnosis not present

## 2023-02-08 DIAGNOSIS — Z992 Dependence on renal dialysis: Secondary | ICD-10-CM | POA: Diagnosis not present

## 2023-02-08 DIAGNOSIS — N200 Calculus of kidney: Secondary | ICD-10-CM | POA: Insufficient documentation

## 2023-02-08 DIAGNOSIS — I671 Cerebral aneurysm, nonruptured: Secondary | ICD-10-CM | POA: Diagnosis not present

## 2023-02-08 DIAGNOSIS — Z01812 Encounter for preprocedural laboratory examination: Secondary | ICD-10-CM

## 2023-02-08 HISTORY — PX: IR NEPHROSTOMY EXCHANGE RIGHT: IMG6070

## 2023-02-08 MED ORDER — SODIUM CHLORIDE 0.9 % IV SOLN: INTRAVENOUS | Status: DC

## 2023-02-08 MED ORDER — LIDOCAINE HCL 1 % IJ SOLN
INTRAMUSCULAR | Status: AC
  Filled 2023-02-08: qty 20

## 2023-02-08 MED ORDER — FENTANYL CITRATE (PF) 100 MCG/2ML IJ SOLN
INTRAMUSCULAR | Status: AC
  Filled 2023-02-08: qty 2

## 2023-02-08 MED ORDER — IOHEXOL 300 MG/ML  SOLN
8.0000 mL | Freq: Once | INTRAMUSCULAR | Status: AC | PRN
  Administered 2023-02-08: 8 mL

## 2023-02-08 MED ORDER — LIDOCAINE HCL 1 % IJ SOLN
7.0000 mL | Freq: Once | INTRAMUSCULAR | Status: AC
  Administered 2023-02-08: 7 mL via INTRADERMAL

## 2023-02-08 MED ORDER — FENTANYL CITRATE (PF) 100 MCG/2ML IJ SOLN
INTRAMUSCULAR | Status: AC | PRN
  Administered 2023-02-08: 50 ug via INTRAVENOUS

## 2023-02-08 MED ORDER — MIDAZOLAM HCL 2 MG/2ML IJ SOLN
INTRAMUSCULAR | Status: AC | PRN
  Administered 2023-02-08: 1 mg via INTRAVENOUS

## 2023-02-08 MED ORDER — MIDAZOLAM HCL 2 MG/2ML IJ SOLN
INTRAMUSCULAR | Status: AC
  Filled 2023-02-08: qty 2
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
## 2023-02-08 NOTE — Procedures (Signed)
Interventional Radiology Procedure Note  Procedure: Image guided drain replacement, right PCN.  53F dawson-mueller drain.  Complications: None  EBL: None  Recommendations: - Routine drain care to gravity  - routine wound care - 3 month routine exchange - 1 hr recovery, then DC when goals met  Signed,  Yvone Neu. Loreta Ave, DO

## 2023-02-22 ENCOUNTER — Ambulatory Visit: Payer: Medicaid Other | Admitting: Podiatry

## 2023-02-22 DIAGNOSIS — M79674 Pain in right toe(s): Secondary | ICD-10-CM | POA: Diagnosis not present

## 2023-02-22 DIAGNOSIS — M79675 Pain in left toe(s): Secondary | ICD-10-CM | POA: Diagnosis not present

## 2023-02-22 DIAGNOSIS — B351 Tinea unguium: Secondary | ICD-10-CM | POA: Diagnosis not present

## 2023-02-22 NOTE — Progress Notes (Signed)
Subjective:  Patient ID: Larry Morrison, male    DOB: [birth year],  MRN: [id number]  Chief Complaint  Patient presents with   Nail Problem    Nail trim    52 y.o. male returns for the above complaint.  Patient presents with thickened onychodystrophy mycotic toenails mild pain on palpation he would like for me debride down is not able to do it himself.  Denies any other acute complaints  Objective:  There were no vitals filed for this visit. Podiatric Exam: Vascular: dorsalis pedis and posterior tibial pulses are palpable bilateral. Capillary return is immediate. Temperature gradient is WNL. Skin turgor WNL  Sensorium: Normal Semmes Weinstein monofilament test. Normal tactile sensation bilaterally. Nail Exam: Pt has thick disfigured discolored nails with subungual debris noted bilateral entire nail hallux through fifth toenails.  Pain on palpation to the nails. Ulcer Exam: There is no evidence of ulcer or pre-ulcerative changes or infection. Orthopedic Exam: Muscle tone and strength are WNL. No limitations in general ROM. No crepitus or effusions noted.  Skin: No Porokeratosis. No infection or ulcers    Assessment & Plan:  No diagnosis found.  Patient was evaluated and treated and all questions answered.  Onychomycosis with pain  -Nails palliatively debrided as below. -Educated on self-care  Procedure: Nail Debridement Rationale: pain  Type of Debridement: manual, sharp debridement. Instrumentation: Nail nipper, rotary burr. Number of Nails: 10  Procedures and Treatment: Consent by patient was obtained for treatment procedures. The patient understood the discussion of treatment and procedures well. All questions were answered thoroughly reviewed. Debridement of mycotic and hypertrophic toenails, 1 through 5 bilateral and clearing of subungual debris. No ulceration, no infection noted.  Return Visit-Office Procedure: Patient instructed to return to the office for a follow up visit 3  months for continued evaluation and treatment.  Nicholes Rough, DPM    No follow-ups on file.

## 2023-05-09 IMAGING — CR DG CHEST 2V
2 series · 2 of 2 positions shown · non-contrast
Comparison: 06/30/2021

CLINICAL DATA: Cough and weakness

EXAM:
CHEST - 2 VIEW

[chest lat]
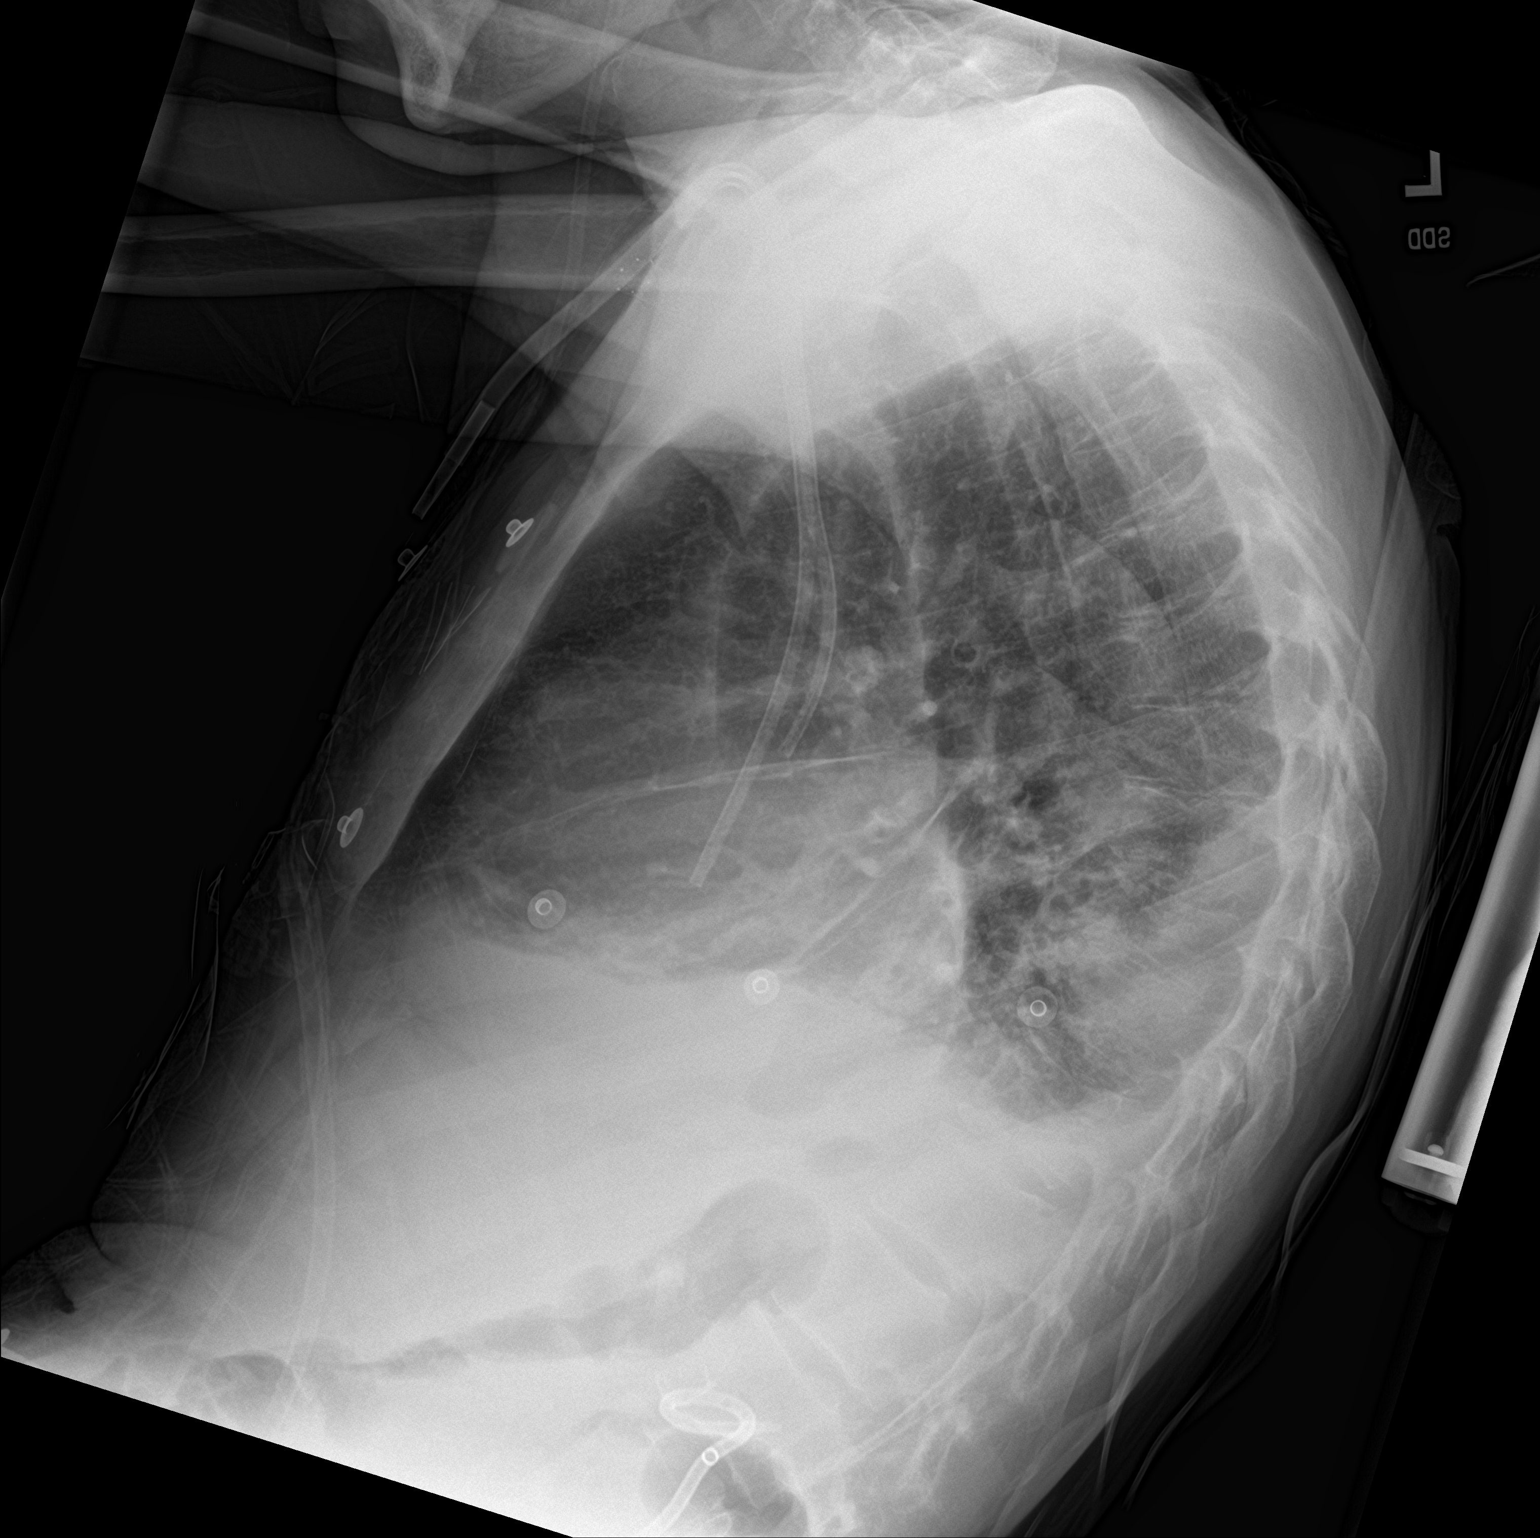

[chest ap]
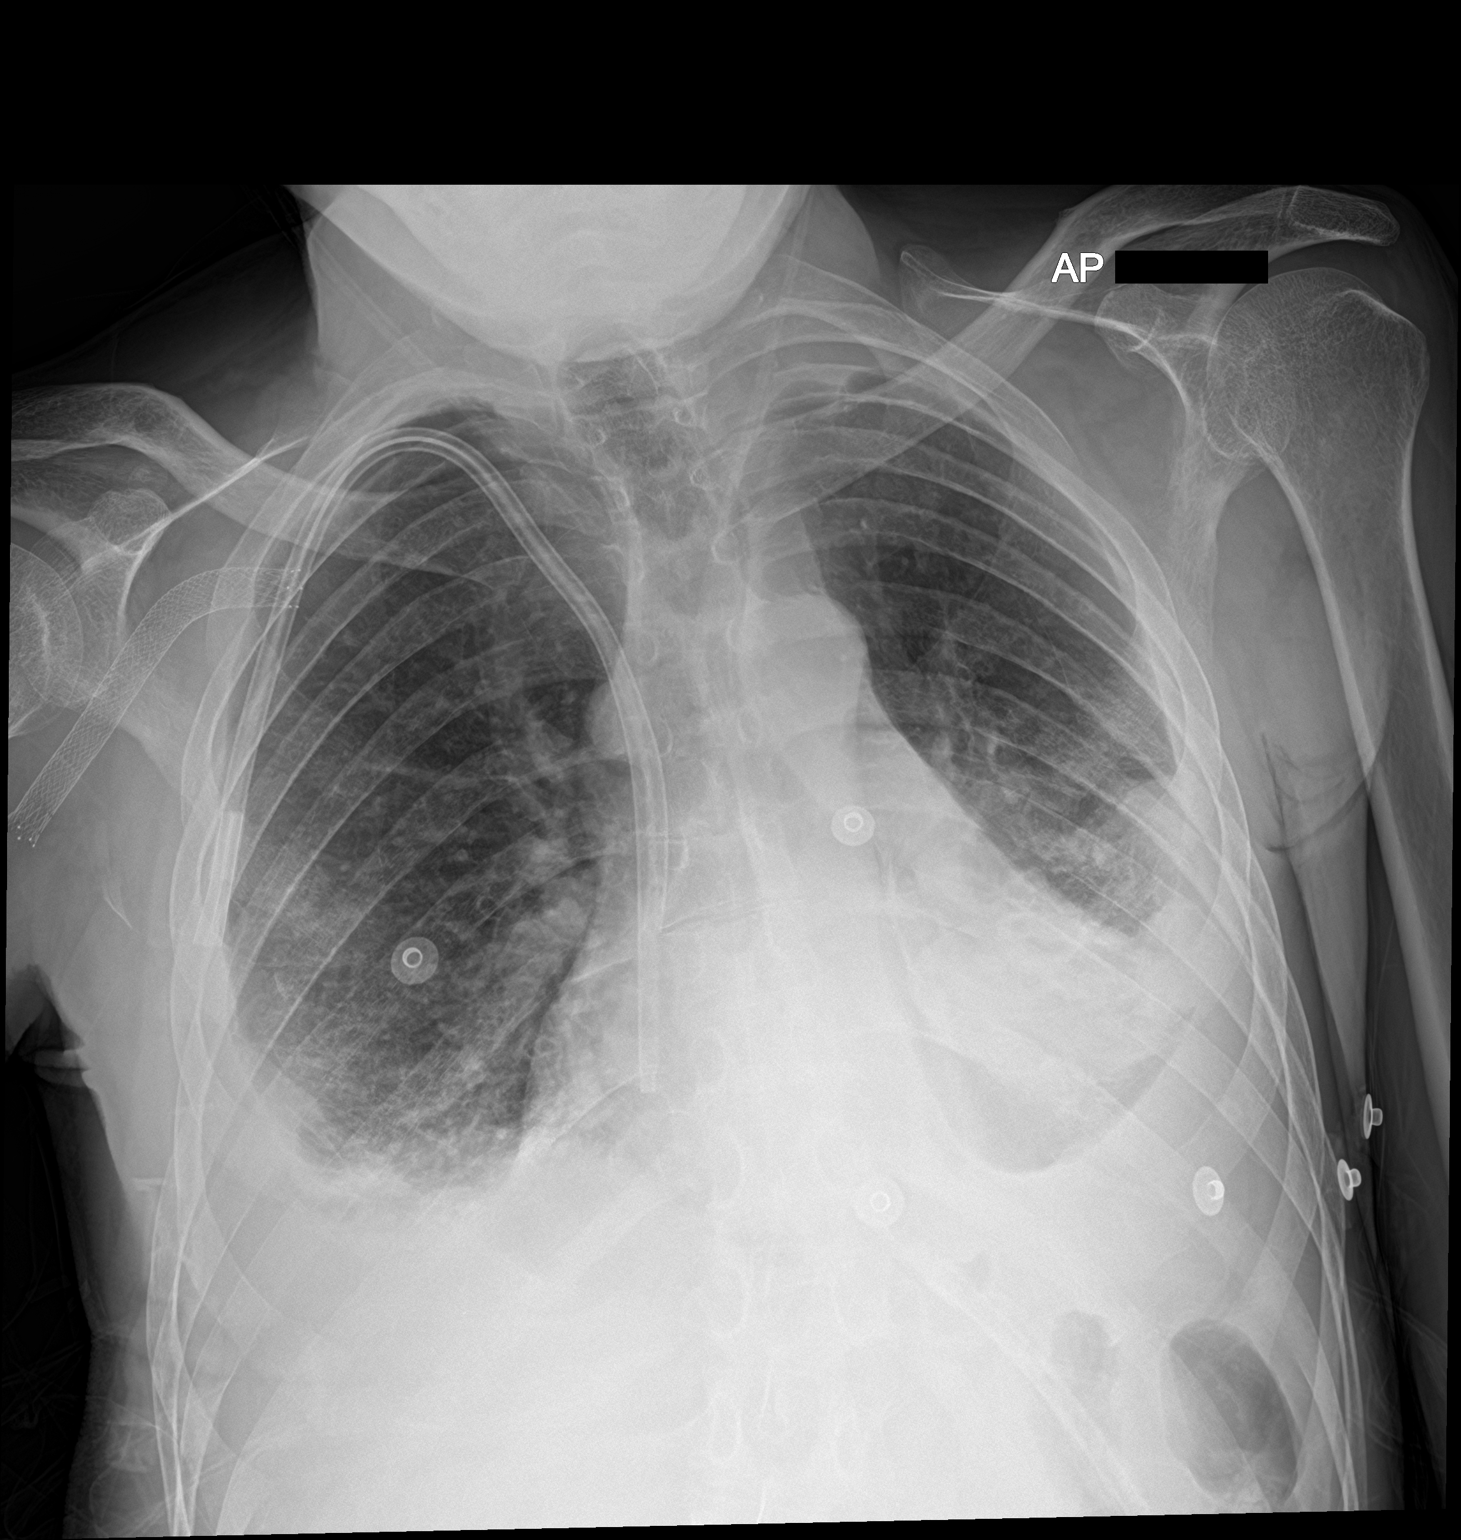

[2 of 2 positions shown; findings below may reference images not displayed]

FINDINGS: Cardiac shadow is enlarged but stable. Dialysis catheter is again
seen. Bilateral pleural effusions are noted left slightly greater
than right stable in appearance from the prior exam. Some patchy
atelectatic changes are seen also stable from the prior study. No
new focal abnormality is noted.
IMPRESSION: Bilateral pleural effusions and basilar atelectasis stable from the
prior exam.

## 2023-05-09 NOTE — Progress Notes (Signed)
Patient for IR RT Neph Ex on Thurs 05/10/2023, I called and spoke with Merry Proud, caretaker on the phone and gave pre-procedure instructions. Merry Proud was made aware to have the patient here at 10a, NPO after MN prior to procedure as well as driver post procedure/recovery/discharge. Merry Proud stated understanding.  Called 05/02/2023

## 2023-05-10 ENCOUNTER — Encounter: Payer: Self-pay | Admitting: Interventional Radiology

## 2023-05-10 ENCOUNTER — Encounter: Payer: Self-pay | Admitting: Radiology

## 2023-05-10 ENCOUNTER — Ambulatory Visit
Admission: RE | Admit: 2023-05-10 | Discharge: 2023-05-10 | Disposition: A | Discharge status: Home / Self Care | Payer: Medicaid Other | Source: Ambulatory Visit | Attending: Interventional Radiology | Admitting: Interventional Radiology

## 2023-05-10 ENCOUNTER — Other Ambulatory Visit: Payer: Self-pay | Admitting: Interventional Radiology

## 2023-05-10 ENCOUNTER — Other Ambulatory Visit: Payer: Self-pay

## 2023-05-10 DIAGNOSIS — F17210 Nicotine dependence, cigarettes, uncomplicated: Secondary | ICD-10-CM | POA: Insufficient documentation

## 2023-05-10 DIAGNOSIS — N186 End stage renal disease: Secondary | ICD-10-CM | POA: Insufficient documentation

## 2023-05-10 DIAGNOSIS — Z992 Dependence on renal dialysis: Secondary | ICD-10-CM | POA: Insufficient documentation

## 2023-05-10 DIAGNOSIS — I132 Hypertensive heart and chronic kidney disease with heart failure and with stage 5 chronic kidney disease, or end stage renal disease: Secondary | ICD-10-CM | POA: Insufficient documentation

## 2023-05-10 DIAGNOSIS — T83092A Other mechanical complication of nephrostomy catheter, initial encounter: Secondary | ICD-10-CM

## 2023-05-10 DIAGNOSIS — N200 Calculus of kidney: Secondary | ICD-10-CM | POA: Diagnosis not present

## 2023-05-10 DIAGNOSIS — Z436 Encounter for attention to other artificial openings of urinary tract: Secondary | ICD-10-CM | POA: Insufficient documentation

## 2023-05-10 DIAGNOSIS — I5022 Chronic systolic (congestive) heart failure: Secondary | ICD-10-CM | POA: Diagnosis not present

## 2023-05-10 HISTORY — PX: IR NEPHROSTOMY EXCHANGE RIGHT: IMG6070

## 2023-05-10 IMAGING — DX DG CHEST 1V PORT
1 series · 1 of 1 positions shown · non-contrast
Comparison: 08/30/2021

CLINICAL DATA: Tachypnea

EXAM:
PORTABLE CHEST 1 VIEW

[chest ap]
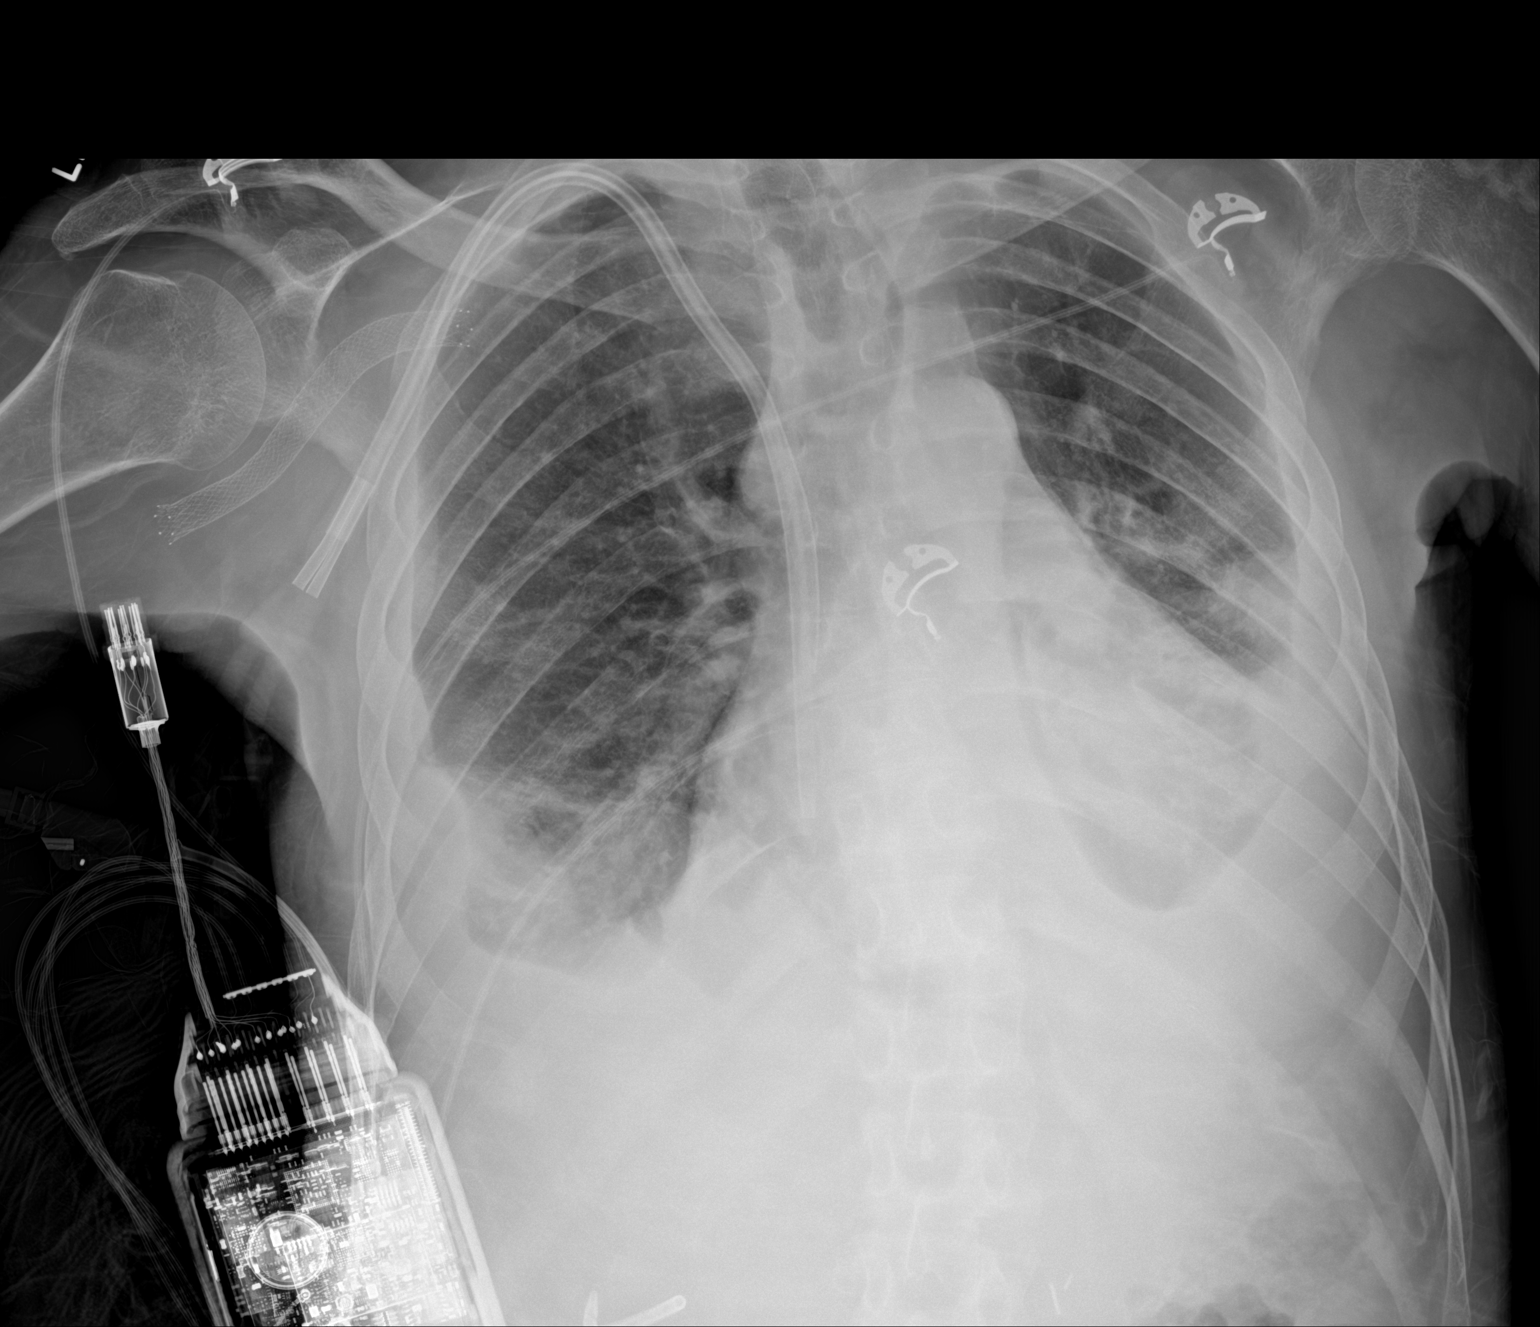

[1 of 1 positions shown; findings below may reference images not displayed]

FINDINGS: Cardiac shadow is enlarged but stable. Dialysis catheter is again
seen. Right arm stent is noted covering the axillary and subclavian
veins. Bilateral pleural effusions are noted similar to that seen on
the prior exam. Stable atelectatic changes are noted as well. No
bony abnormality is seen.
IMPRESSION: Stable appearance of the chest when compared with the previous day.

## 2023-05-10 MED ORDER — IOHEXOL 300 MG/ML  SOLN
8.0000 mL | Freq: Once | INTRAMUSCULAR | Status: AC | PRN
  Administered 2023-05-10: 8 mL

## 2023-05-10 MED ORDER — MIDAZOLAM HCL 2 MG/2ML IJ SOLN
INTRAMUSCULAR | Status: AC | PRN
Start: 2023-05-10 — End: 2023-05-10
  Administered 2023-05-10: 1 mg via INTRAVENOUS

## 2023-05-10 MED ORDER — LIDOCAINE HCL 1 % IJ SOLN
INTRAMUSCULAR | Status: AC
  Filled 2023-05-10: qty 20

## 2023-05-10 MED ORDER — LIDOCAINE HCL 1 % IJ SOLN
8.0000 mL | Freq: Once | INTRAMUSCULAR | Status: AC
  Administered 2023-05-10: 8 mL via INTRADERMAL

## 2023-05-10 MED ORDER — MIDAZOLAM HCL 2 MG/2ML IJ SOLN
INTRAMUSCULAR | Status: AC
  Filled 2023-05-10: qty 4

## 2023-05-10 MED ORDER — FENTANYL CITRATE (PF) 100 MCG/2ML IJ SOLN
INTRAMUSCULAR | Status: AC | PRN
  Administered 2023-05-10: 50 ug via INTRAVENOUS

## 2023-05-10 MED ORDER — FENTANYL CITRATE (PF) 100 MCG/2ML IJ SOLN
INTRAMUSCULAR | Status: AC
  Filled 2023-05-10: qty 2
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
## 2023-05-10 NOTE — Procedures (Signed)
Interventional Radiology Procedure Note  Procedure:   Routine right PCN exchange.  New 10.2 F dawson meuller. To gravity  Complications: None Recommendations:  - To gravity - Do not submerge for 7 days - Routine care - dc 1 hr when goals met  Signed,  Yvone Neu. Loreta Ave, DO

## 2023-05-10 NOTE — Progress Notes (Signed)
Home meds and questions in CARD-RAD, answered and verified with Gearldine Bienenstock, caretaker.

## 2023-05-25 ENCOUNTER — Emergency Department
Admission: EM | Admit: 2023-05-25 | Discharge: 2023-05-25 | Disposition: A | Discharge status: Home / Self Care | Payer: Medicaid Other | Attending: Emergency Medicine | Admitting: Emergency Medicine

## 2023-05-25 ENCOUNTER — Other Ambulatory Visit: Payer: Self-pay

## 2023-05-25 DIAGNOSIS — R197 Diarrhea, unspecified: Secondary | ICD-10-CM | POA: Diagnosis not present

## 2023-05-25 DIAGNOSIS — R630 Anorexia: Secondary | ICD-10-CM | POA: Diagnosis not present

## 2023-05-25 DIAGNOSIS — R110 Nausea: Secondary | ICD-10-CM | POA: Insufficient documentation

## 2023-05-25 LAB — COMPREHENSIVE METABOLIC PANEL
ALT: 108 U/L — ABNORMAL HIGH (ref 0–44)
AST: 83 U/L — ABNORMAL HIGH (ref 15–41)
Albumin: 3 g/dL — ABNORMAL LOW (ref 3.5–5.0)
Alkaline Phosphatase: 89 U/L (ref 38–126)
Anion gap: 17 — ABNORMAL HIGH (ref 5–15)
BUN: 71 mg/dL — ABNORMAL HIGH (ref 6–20)
CO2: 23 mmol/L (ref 22–32)
Calcium: 8.7 mg/dL — ABNORMAL LOW (ref 8.9–10.3)
Chloride: 101 mmol/L (ref 98–111)
Creatinine, Ser: 11.83 mg/dL — ABNORMAL HIGH (ref 0.61–1.24)
GFR, Estimated: 5 mL/min — ABNORMAL LOW (ref >60)
Glucose, Bld: 81 mg/dL (ref 70–99)
Potassium: 5.1 mmol/L (ref 3.5–5.1)
Sodium: 141 mmol/L (ref 135–145)
Total Bilirubin: 0.9 mg/dL (ref 0.0–1.2)
Total Protein: 7.4 g/dL (ref 6.5–8.1)

## 2023-05-25 LAB — CBC
HCT: 33.3 % — ABNORMAL LOW (ref 39.0–52.0)
Hemoglobin: 10.5 g/dL — ABNORMAL LOW (ref 13.0–17.0)
MCH: 31.8 pg (ref 26.0–34.0)
MCHC: 31.5 g/dL (ref 30.0–36.0)
MCV: 100.9 fL — ABNORMAL HIGH (ref 80.0–100.0)
Platelets: 116 10*3/uL — ABNORMAL LOW (ref 150–400)
RBC: 3.3 MIL/uL — ABNORMAL LOW (ref 4.22–5.81)
RDW: 14.7 % (ref 11.5–15.5)
WBC: 4 10*3/uL (ref 4.0–10.5)
nRBC: 0 % (ref 0.0–0.2)

## 2023-05-25 LAB — LIPASE, BLOOD: Lipase: 34 U/L (ref 11–51)

## 2023-05-25 MED ORDER — ONDANSETRON 4 MG PO TBDP: 4.0000 mg | ORAL_TABLET | Freq: Three times a day (TID) | ORAL | 0 refills | Status: DC | PRN

## 2023-05-25 NOTE — ED Triage Notes (Signed)
 Pt comes via [facility] from [facility] center. Pt c/o nausea  since 3am. Pt states he did call yesterday but refused transport and thought he would get over it. Pt does have runny nose also.  VSS

## 2023-05-25 NOTE — Discharge Instructions (Signed)
 Please seek medical attention for any high fevers, chest pain, shortness of breath, change in behavior, persistent vomiting, bloody stool or any other new or concerning symptoms.

## 2023-05-25 NOTE — ED Notes (Signed)
 Attempted to call DSS x3 - unable to get in contact with them at this time.  Attempted to call [facility] x3 - unable to get in contact with them at this time.  Transport requested.

## 2023-05-25 NOTE — ED Triage Notes (Signed)
 Pt here via [facility] with nausea. Pt denies abd pain but states he has been having constant nausea despite not eating much. Pt denies cough or sneezing.

## 2023-05-25 NOTE — ED Provider Notes (Signed)
 [facility] Provider Note    Event Date/Time   First MD Initiated Contact with Patient 05/25/23 1844     (approximate)   History   Nausea   HPI  Larry Morrison is a 53 y.o. male presents to the emergency department today from his group home because of concerns for nausea and decreased oral appetite. Denies any actual vomiting. Did have some loose stools. Symptoms started early this morning. Had a normal day yesterday and felt fine. Denies any associated abdominal pain. Does have history of ESRD and is on dialysis. Got dialysis yesterday and is scheduled to go again tomorrow.      Physical Exam   Triage Vital Signs: ED Triage Vitals  Encounter Vitals Group     BP 05/25/23 1204 (!) 147/86     Systolic BP Percentile --      Diastolic BP Percentile --      Pulse Rate 05/25/23 1204 83     Resp 05/25/23 1204 18     Temp 05/25/23 1204 99.7 F (37.6 C)     Temp Source 05/25/23 1204 Oral     SpO2 05/25/23 1204 100 %     Weight 05/25/23 1205 104 lb 8 oz (47.4 kg)     Height 05/25/23 1205 5' 4 (1.626 m)     Head Circumference --      Peak Flow --      Pain Score 05/25/23 1205 0     Pain Loc --      Pain Education --      Exclude from Growth Chart --     Most recent vital signs: Vitals:   05/25/23 1204 05/25/23 1619  BP: (!) 147/86 (!) 147/78  Pulse: 83 83  Resp: 18 18  Temp: 99.7 F (37.6 C) 98 F (36.7 C)  SpO2: 100% 96%   General: Awake, alert, oriented. CV:  Good peripheral perfusion. Regular rate and rhythm. Resp:  Normal effort. Lungs clear. Abd:  No distention.    ED Results / Procedures / Treatments   Labs (all labs ordered are listed, but only abnormal results are displayed) Labs Reviewed  CBC - Abnormal; Notable for the following components:      Result Value   RBC 3.30 (*)    Hemoglobin 10.5 (*)    HCT 33.3 (*)    MCV 100.9 (*)    Platelets 116 (*)    All other components within normal limits  COMPREHENSIVE METABOLIC  PANEL - Abnormal; Notable for the following components:   BUN 71 (*)    Creatinine, Ser 11.83 (*)    Calcium  8.7 (*)    Albumin  3.0 (*)    AST 83 (*)    ALT 108 (*)    GFR, Estimated 5 (*)    Anion gap 17 (*)    All other components within normal limits  LIPASE, BLOOD  URINALYSIS, ROUTINE W REFLEX MICROSCOPIC     EKG  None   RADIOLOGY None   PROCEDURES:  Critical Care performed: No   MEDICATIONS ORDERED IN ED: Medications - No data to display   IMPRESSION / MDM / ASSESSMENT AND PLAN / ED COURSE  I reviewed the triage vital signs and the nursing notes.                              Differential diagnosis includes, but is not limited to, viral illness, food poisoning, pancreatitis  Patient's  presentation is most consistent with acute presentation with potential threat to life or bodily function.   Patient presented to the emergency department today because of concerns for nausea and decreased appetite that started early this morning.  The time my exam however the patient states he is feeling better.  He is eating and drinking.  Blood work without concerning leukocytosis.  Patient's creatinine and BUN are elevated over his baseline however patient is on dialysis. At this time given that patient feels better I do think it is reasonable for patient to be discharged home. Has dialysis scheduled for tomorrow.      FINAL CLINICAL IMPRESSION(S) / ED DIAGNOSES   Final diagnoses:  Nausea     Note:  This document was prepared using Dragon voice recognition software and may include unintentional dictation errors.    Floy Roberts, MD 05/25/23 2019

## 2023-06-22 ENCOUNTER — Emergency Department
Admission: EM | Admit: 2023-06-22 | Discharge: 2023-06-22 | Disposition: A | Discharge status: Home / Self Care | Payer: Medicaid Other | Attending: Emergency Medicine | Admitting: Emergency Medicine

## 2023-06-22 DIAGNOSIS — N398 Other specified disorders of urinary system: Secondary | ICD-10-CM | POA: Diagnosis not present

## 2023-06-22 DIAGNOSIS — N390 Urinary tract infection, site not specified: Secondary | ICD-10-CM

## 2023-06-22 DIAGNOSIS — Z992 Dependence on renal dialysis: Secondary | ICD-10-CM | POA: Insufficient documentation

## 2023-06-22 DIAGNOSIS — T83518A Infection and inflammatory reaction due to other urinary catheter, initial encounter: Secondary | ICD-10-CM | POA: Insufficient documentation

## 2023-06-22 DIAGNOSIS — N186 End stage renal disease: Secondary | ICD-10-CM | POA: Insufficient documentation

## 2023-06-22 LAB — BASIC METABOLIC PANEL
Anion gap: 13 (ref 5–15)
BUN: 38 mg/dL — ABNORMAL HIGH (ref 6–20)
CO2: 29 mmol/L (ref 22–32)
Calcium: 8.4 mg/dL — ABNORMAL LOW (ref 8.9–10.3)
Chloride: 97 mmol/L — ABNORMAL LOW (ref 98–111)
Creatinine, Ser: 7.87 mg/dL — ABNORMAL HIGH (ref 0.61–1.24)
GFR, Estimated: 8 mL/min — ABNORMAL LOW (ref >60)
Glucose, Bld: 84 mg/dL (ref 70–99)
Potassium: 3.7 mmol/L (ref 3.5–5.1)
Sodium: 139 mmol/L (ref 135–145)

## 2023-06-22 LAB — CBC WITH DIFFERENTIAL/PLATELET
Abs Immature Granulocytes: 0.02 10*3/uL (ref 0.00–0.07)
Basophils Absolute: 0.1 10*3/uL (ref 0.0–0.1)
Basophils Relative: 1 %
Eosinophils Absolute: 0.4 10*3/uL (ref 0.0–0.5)
Eosinophils Relative: 6 %
HCT: 38.1 % — ABNORMAL LOW (ref 39.0–52.0)
Hemoglobin: 12.2 g/dL — ABNORMAL LOW (ref 13.0–17.0)
Immature Granulocytes: 0 %
Lymphocytes Relative: 20 %
Lymphs Abs: 1.2 10*3/uL (ref 0.7–4.0)
MCH: 31.7 pg (ref 26.0–34.0)
MCHC: 32 g/dL (ref 30.0–36.0)
MCV: 99 fL (ref 80.0–100.0)
Monocytes Absolute: 0.5 10*3/uL (ref 0.1–1.0)
Monocytes Relative: 8 %
Neutro Abs: 4.1 10*3/uL (ref 1.7–7.7)
Neutrophils Relative %: 65 %
Platelets: 146 10*3/uL — ABNORMAL LOW (ref 150–400)
RBC: 3.85 MIL/uL — ABNORMAL LOW (ref 4.22–5.81)
RDW: 13.6 % (ref 11.5–15.5)
WBC: 6.3 10*3/uL (ref 4.0–10.5)
nRBC: 0 % (ref 0.0–0.2)

## 2023-06-22 LAB — URINALYSIS, ROUTINE W REFLEX MICROSCOPIC
Bacteria, UA: NONE SEEN
RBC / HPF: >50 RBC/hpf (ref 0–5)
Squamous Epithelial / HPF: 0 /[HPF] (ref 0–5)
WBC, UA: >50 WBC/hpf (ref 0–5)

## 2023-06-22 MED ORDER — NITROFURANTOIN MACROCRYSTAL 100 MG PO CAPS: 100.0000 mg | ORAL_CAPSULE | Freq: Two times a day (BID) | ORAL | 0 refills | Status: AC

## 2023-06-22 NOTE — Discharge Instructions (Signed)
Your urine test suggests a urinary tract infection. Please take nitrofurantoin as prescribed and follow up with Urology for further assessment. Your catheter is draining and does not need to be changed today since it is not obstructed.

## 2023-06-22 NOTE — ED Provider Notes (Signed)
[facility] Provider Note    Event Date/Time   First MD Initiated Contact with Patient 06/22/23 [phone]     (approximate)   History   Chief Complaint: Hematuria   HPI  Larry Morrison is a 53 y.o. male with a history of ESRD on hemodialysis, chronic right nephrostomy tube, wheelchair dependence who is brought to the ED due to concern for blood in his urine collection bag.  Patient reports that his nephrostomy catheter has not been changed in 4 months and request for it to be changed today in the emergency department.  He denies any pain or fever or any other acute complaints. Denies recent medication changes         Physical Exam   Triage Vital Signs: ED Triage Vitals  Encounter Vitals Group     BP 06/22/23 0307 (!) 150/84     Systolic BP Percentile --      Diastolic BP Percentile --      Pulse Rate 06/22/23 0307 77     Resp 06/22/23 0307 18     Temp 06/22/23 0307 98 F (36.7 C)     Temp Source 06/22/23 0307 Oral     SpO2 06/22/23 0307 96 %     Weight 06/22/23 0307 130 lb (59 kg)     Height 06/22/23 0307 5\' 4"  (1.626 m)     Head Circumference --      Peak Flow --      Pain Score 06/22/23 0715 Asleep     Pain Loc --      Pain Education --      Exclude from Growth Chart --     Most recent vital signs: Vitals:   06/22/23 0744 06/22/23 0746  BP: (!) 149/79 (!) 149/79  Pulse: 77 77  Resp:  18  Temp: 98.3 F (36.8 C) 98.3 F (36.8 C)  SpO2:  99%    General: Awake, no distress.  CV:  Good peripheral perfusion.  Regular rate rhythm Resp:  Normal effort.  Clear to auscultation bilaterally Abd:  No distention.  Soft nontender Other:  No CVA tenderness.  No lower extremity edema.  Urine collection bag has been clear urine with dark purple discoloration, no clots or sediment   ED Results / Procedures / Treatments   Labs (all labs ordered are listed, but only abnormal results are displayed) Labs Reviewed  CBC WITH DIFFERENTIAL/PLATELET -  Abnormal; Notable for the following components:      Result Value   RBC 3.85 (*)    Hemoglobin 12.2 (*)    HCT 38.1 (*)    Platelets 146 (*)    All other components within normal limits  BASIC METABOLIC PANEL - Abnormal; Notable for the following components:   Chloride 97 (*)    BUN 38 (*)    Creatinine, Ser 7.87 (*)    Calcium 8.4 (*)    GFR, Estimated 8 (*)    All other components within normal limits  URINALYSIS, ROUTINE W REFLEX MICROSCOPIC - Abnormal; Notable for the following components:   Color, Urine RED (*)    APPearance TURBID (*)    Glucose, UA   (*)    Value: TEST NOT REPORTED DUE TO COLOR INTERFERENCE OF URINE PIGMENT   Hgb urine dipstick   (*)    Value: TEST NOT REPORTED DUE TO COLOR INTERFERENCE OF URINE PIGMENT   Bilirubin Urine   (*)    Value: TEST NOT REPORTED DUE TO COLOR INTERFERENCE OF  URINE PIGMENT   Ketones, ur   (*)    Value: TEST NOT REPORTED DUE TO COLOR INTERFERENCE OF URINE PIGMENT   Protein, ur   (*)    Value: TEST NOT REPORTED DUE TO COLOR INTERFERENCE OF URINE PIGMENT   Nitrite   (*)    Value: TEST NOT REPORTED DUE TO COLOR INTERFERENCE OF URINE PIGMENT   Leukocytes,Ua   (*)    Value: TEST NOT REPORTED DUE TO COLOR INTERFERENCE OF URINE PIGMENT   All other components within normal limits     EKG    RADIOLOGY    PROCEDURES:  Procedures   MEDICATIONS ORDERED IN ED: Medications - No data to display   IMPRESSION / MDM / ASSESSMENT AND PLAN / ED COURSE  I reviewed the triage vital signs and the nursing notes.  DDx: Electrolyte derangement, anemia, UTI.  Doubt porphyria, lead toxicity  Patient's presentation is most consistent with acute presentation with potential threat to life or bodily function.  Patient presents with purple urine.  Review of outside records shows that urine catheter was exchanged 1 month ago by IR, and it is not currently obstructed.  Serum labs are unremarkable in the context of his ESRD, no evidence of  hemolysis.  Urinalysis is difficult to interpret, but with large red and white cells and white blood cell clumps, will treat for UTI pending culture.  Macrobid ordered.  Recommend follow-up with urology for further assessment.       FINAL CLINICAL IMPRESSION(S) / ED DIAGNOSES   Final diagnoses:  Purple urine bag syndrome (HCC)  ESRD on hemodialysis (HCC)     Rx / DC Orders   ED Discharge Orders          Ordered    nitrofurantoin (MACRODANTIN) 100 MG capsule  2 times daily        06/22/23 0801             Note:  This document was prepared using Dragon voice recognition software and may include unintentional dictation errors.   Sharman Cheek, MD 06/22/23 [phone]

## 2023-06-22 NOTE — ED Notes (Signed)
Transportation will be provided by [facility], will call this RN back when Pt out front.

## 2023-06-22 NOTE — ED Triage Notes (Addendum)
Patient C/O urinary catheter pain (around insertion site) that started around 0000 this morning. Patient has a suprapubic catheter in place and states that it should be replaced every 3-4 months, and it has not been replaced in 4 months. Frank blood noted in urine. Patient also has right nephrostomy tube in place. Dialysis [facility], Friday

## 2023-06-22 NOTE — ED Notes (Signed)
This RN called legal guardian DSS at two different Phone numbers to to give them a heads up to d/c Pt.

## 2023-06-24 ENCOUNTER — Other Ambulatory Visit: Payer: Self-pay

## 2023-06-24 ENCOUNTER — Emergency Department: Payer: Medicaid Other

## 2023-06-24 ENCOUNTER — Emergency Department
Admission: EM | Admit: 2023-06-24 | Discharge: 2023-06-24 | Disposition: A | Discharge status: Home / Self Care | Payer: Medicaid Other | Attending: Emergency Medicine | Admitting: Emergency Medicine

## 2023-06-24 DIAGNOSIS — N186 End stage renal disease: Secondary | ICD-10-CM | POA: Insufficient documentation

## 2023-06-24 DIAGNOSIS — N99528 Other complication of other external stoma of urinary tract: Secondary | ICD-10-CM

## 2023-06-24 DIAGNOSIS — I509 Heart failure, unspecified: Secondary | ICD-10-CM | POA: Diagnosis not present

## 2023-06-24 DIAGNOSIS — I132 Hypertensive heart and chronic kidney disease with heart failure and with stage 5 chronic kidney disease, or end stage renal disease: Secondary | ICD-10-CM | POA: Diagnosis not present

## 2023-06-24 DIAGNOSIS — R310 Gross hematuria: Secondary | ICD-10-CM

## 2023-06-24 DIAGNOSIS — N12 Tubulo-interstitial nephritis, not specified as acute or chronic: Secondary | ICD-10-CM | POA: Diagnosis not present

## 2023-06-24 DIAGNOSIS — Y732 Prosthetic and other implants, materials and accessory gastroenterology and urology devices associated with adverse incidents: Secondary | ICD-10-CM | POA: Diagnosis not present

## 2023-06-24 DIAGNOSIS — T83092A Other mechanical complication of nephrostomy catheter, initial encounter: Secondary | ICD-10-CM | POA: Diagnosis present

## 2023-06-24 DIAGNOSIS — Z992 Dependence on renal dialysis: Secondary | ICD-10-CM | POA: Insufficient documentation

## 2023-06-24 LAB — CBC WITH DIFFERENTIAL/PLATELET
Abs Immature Granulocytes: 0.01 10*3/uL (ref 0.00–0.07)
Basophils Absolute: 0 10*3/uL (ref 0.0–0.1)
Basophils Relative: 1 %
Eosinophils Absolute: 0.3 10*3/uL (ref 0.0–0.5)
Eosinophils Relative: 6 %
HCT: 38.5 % — ABNORMAL LOW (ref 39.0–52.0)
Hemoglobin: 12.5 g/dL — ABNORMAL LOW (ref 13.0–17.0)
Immature Granulocytes: 0 %
Lymphocytes Relative: 28 %
Lymphs Abs: 1.3 10*3/uL (ref 0.7–4.0)
MCH: 32 pg (ref 26.0–34.0)
MCHC: 32.5 g/dL (ref 30.0–36.0)
MCV: 98.5 fL (ref 80.0–100.0)
Monocytes Absolute: 0.4 10*3/uL (ref 0.1–1.0)
Monocytes Relative: 9 %
Neutro Abs: 2.6 10*3/uL (ref 1.7–7.7)
Neutrophils Relative %: 56 %
Platelets: 168 10*3/uL (ref 150–400)
RBC: 3.91 MIL/uL — ABNORMAL LOW (ref 4.22–5.81)
RDW: 14.3 % (ref 11.5–15.5)
WBC: 4.6 10*3/uL (ref 4.0–10.5)
nRBC: 0 % (ref 0.0–0.2)

## 2023-06-24 LAB — URINALYSIS, ROUTINE W REFLEX MICROSCOPIC
RBC / HPF: >50 RBC/hpf (ref 0–5)
Squamous Epithelial / HPF: 0 /[HPF] (ref 0–5)
WBC, UA: >50 WBC/hpf (ref 0–5)

## 2023-06-24 LAB — BASIC METABOLIC PANEL
Anion gap: 10 (ref 5–15)
BUN: 42 mg/dL — ABNORMAL HIGH (ref 6–20)
CO2: 27 mmol/L (ref 22–32)
Calcium: 8.3 mg/dL — ABNORMAL LOW (ref 8.9–10.3)
Chloride: 100 mmol/L (ref 98–111)
Creatinine, Ser: 8.11 mg/dL — ABNORMAL HIGH (ref 0.61–1.24)
GFR, Estimated: 7 mL/min — ABNORMAL LOW (ref >60)
Glucose, Bld: 107 mg/dL — ABNORMAL HIGH (ref 70–99)
Potassium: 4.3 mmol/L (ref 3.5–5.1)
Sodium: 137 mmol/L (ref 135–145)

## 2023-06-24 MED ORDER — CEFPODOXIME PROXETIL 200 MG PO TABS: 200.0000 mg | ORAL_TABLET | ORAL | 0 refills | Status: AC

## 2023-06-24 MED ORDER — OXYCODONE-ACETAMINOPHEN 5-325 MG PO TABS
1.0000 | ORAL_TABLET | Freq: Once | ORAL | Status: AC
  Administered 2023-06-24: 1 via ORAL
  Filled 2023-06-24: qty 1

## 2023-06-24 MED ORDER — SODIUM CHLORIDE 0.9 % IV SOLN
1.0000 g | Freq: Once | INTRAVENOUS | Status: AC
  Administered 2023-06-24: 1 g via INTRAVENOUS
  Filled 2023-06-24: qty 10

## 2023-06-24 NOTE — ED Notes (Signed)
 Patient transferred to CT

## 2023-06-24 NOTE — ED Provider Notes (Addendum)
[facility] Provider Note    Event Date/Time   First MD Initiated Contact with Patient 06/24/23 [phone]     (approximate)   History   Chief Complaint suprapubic bag complaint   HPI  Larry Morrison is a 53 y.o. male with past medical history of hypertension, CHF, ESRD on HD (MWF), and urinary obstruction status post nephrostomy who presents to the ED complaining of nephrostomy problem.  Patient reports that for the past 2 days he has consistently noted blood in his nephrostomy bag.  He was seen in the ED for this 2 days ago, prescribed antibiotic for possible infection but states he has been unable to pick up this antibiotic.  He has had pain in his right flank since then but denies any fevers, abdominal pain, nausea, or vomiting.  The nephrostomy bag continues to put out bright red blood as well as urine, but he has not noticed any bleeding or swelling from around the nephrostomy site.  He completed his dialysis treatment 2 days ago without issue, has not missed any treatments recently.      Physical Exam   Triage Vital Signs: ED Triage Vitals [06/24/23 0119]  Encounter Vitals Group     BP (!) 163/83     Systolic BP Percentile      Diastolic BP Percentile      Pulse Rate 72     Resp 16     Temp 98 F (36.7 C)     Temp Source Oral     SpO2 99 %     Weight 127 lb 13.9 oz (58 kg)     Height 5\' 4"  (1.626 m)     Head Circumference      Peak Flow      Pain Score 4     Pain Loc      Pain Education      Exclude from Growth Chart     Most recent vital signs: Vitals:   06/24/23 1043 06/24/23 1045  BP: 138/62   Pulse: 71   Resp: 16   Temp: 98 F (36.7 C) 98.1 F (36.7 C)  SpO2: 98%     Constitutional: Alert and oriented. Eyes: Conjunctivae are normal. Head: Atraumatic. Nose: No congestion/rhinnorhea. Mouth/Throat: Mucous membranes are moist.  Cardiovascular: Normal rate, regular rhythm. Grossly normal heart sounds.  2+ radial pulses bilaterally.   Left upper extremity AV fistula with palpable thrill. Respiratory: Normal respiratory effort.  No retractions. Lungs CTAB. Gastrointestinal: Soft and nontender. No distention.  Right flank nephrostomy site clean, dry, and intact. Musculoskeletal: No lower extremity tenderness nor edema.  Neurologic:  Normal speech and language. No gross focal neurologic deficits are appreciated.    ED Results / Procedures / Treatments   Labs (all labs ordered are listed, but only abnormal results are displayed) Labs Reviewed  BASIC METABOLIC PANEL - Abnormal; Notable for the following components:      Result Value   Glucose, Bld 107 (*)    BUN 42 (*)    Creatinine, Ser 8.11 (*)    Calcium 8.3 (*)    GFR, Estimated 7 (*)    All other components within normal limits  CBC WITH DIFFERENTIAL/PLATELET - Abnormal; Notable for the following components:   RBC 3.91 (*)    Hemoglobin 12.5 (*)    HCT 38.5 (*)    All other components within normal limits  URINALYSIS, ROUTINE W REFLEX MICROSCOPIC - Abnormal; Notable for the following components:   Color, Urine BLACK (*)  APPearance TURBID (*)    Glucose, UA   (*)    Value: TEST NOT REPORTED DUE TO COLOR INTERFERENCE OF URINE PIGMENT   Hgb urine dipstick   (*)    Value: TEST NOT REPORTED DUE TO COLOR INTERFERENCE OF URINE PIGMENT   Bilirubin Urine   (*)    Value: TEST NOT REPORTED DUE TO COLOR INTERFERENCE OF URINE PIGMENT   Ketones, ur   (*)    Value: TEST NOT REPORTED DUE TO COLOR INTERFERENCE OF URINE PIGMENT   Protein, ur   (*)    Value: TEST NOT REPORTED DUE TO COLOR INTERFERENCE OF URINE PIGMENT   Nitrite   (*)    Value: TEST NOT REPORTED DUE TO COLOR INTERFERENCE OF URINE PIGMENT   Leukocytes,Ua   (*)    Value: TEST NOT REPORTED DUE TO COLOR INTERFERENCE OF URINE PIGMENT   Bacteria, UA FEW (*)    All other components within normal limits  URINE CULTURE    RADIOLOGY CT renal protocol reviewed and interpreted by me with appropriate  positioning of nephrostomy tube, no ureteral stones noted.  PROCEDURES:  Critical Care performed: No  Procedures   MEDICATIONS ORDERED IN ED: Medications  cefTRIAXone (ROCEPHIN) 1 g in sodium chloride 0.9 % 100 mL IVPB (has no administration in time range)  oxyCODONE-acetaminophen (PERCOCET/ROXICET) 5-325 MG per tablet 1 tablet (1 tablet Oral Given 06/24/23 1006)     IMPRESSION / MDM / ASSESSMENT AND PLAN / ED COURSE  I reviewed the triage vital signs and the nursing notes.                              53 y.o. male with past medical history of hypertension, CHF, ESRD on HD (MWF), and urinary obstruction status post right nephrostomy who presents to the ED with hematuria in his nephrostomy bag for the past 2 days with right flank pain.  Patient's presentation is most consistent with acute presentation with potential threat to life or bodily function.  Differential diagnosis includes, but is not limited to, displaced nephrostomy, kidney stone, pyelonephritis, urinary obstruction.  Patient nontoxic-appearing and in no acute distress, vital signs are unremarkable.  Nephrostomy tube with no concerning findings externally, no signs of infection noted, he did just empty the bag but notes significant hematuria over the past 2 days.  Urinalysis from 2 days ago concerning for possible infection, although he did not pick up antibiotics.  We will check CT imaging to ensure appropriate positioning of the tube, lab results pending at this time.  Urinalysis again difficult to interpret but will send for culture today.  CT renal protocol shows appropriate positioning of nephrostomy tube, there does appear to be mild dilation of the ureter and kidney on this side, potentially consistent with infection.  No findings concerning for sepsis, labs are reassuring with stable renal function and no significant anemia, leukocytosis, or electrolyte abnormality.  Patient's pain improved on reassessment and he is  appropriate for outpatient management, was given a dose of IV Rocephin here in the ED and urine sent for culture.  He was provided with referral to follow-up with urology, counseled to return to the ED for new or worsening symptoms.  Patient agrees with plan.  Case discussed with Dr. Mena Goes of urology, agrees with plan for antibiotics and outpatient follow-up, no need to exchange nephrostomy tube at this time given it was recently done in December.  Patient counseled on need  to follow-up with urology and counseled to return to the ED for new or worsening symptoms.  Patient agrees with plan.      FINAL CLINICAL IMPRESSION(S) / ED DIAGNOSES   Final diagnoses:  Pyelonephritis  Gross hematuria  Nephrostomy complication (HCC)     Rx / DC Orders   ED Discharge Orders          Ordered    cefpodoxime (VANTIN) 200 MG tablet  3 times weekly        06/24/23 1109             Note:  This document was prepared using Dragon voice recognition software and may include unintentional dictation errors.   Chesley Noon, MD 06/24/23 1111    Chesley Noon, MD 06/24/23 [phone]

## 2023-06-24 NOTE — ED Notes (Addendum)
Called [employer] to check on transportation. Gearldine Bienenstock stated she got someone to pick him him and will call me when they are on their way.

## 2023-06-24 NOTE — ED Provider Triage Note (Signed)
Emergency Medicine Provider Triage Evaluation Note  Larry Morrison, a 53 y.o. male  was evaluated in triage.  Pt complains of right nephrostomy tub discomfort. Patient with a history of ESRD on hemodialysis, chronic right nephrostomy tube, wheelchair dependence who is brought to the ED due to concern for blood in his urine collection bag.  Patient presents to the ED 3 days ago for the same complaint.  Patient last had dialysis on Friday.  Review of Systems  Positive: Right flank pain Negative: FCS  Physical Exam  BP (!) 163/83   Pulse 72   Temp 98 F (36.7 C) (Oral)   Resp 16   Ht 5\' 4"  (1.626 m)   Wt 58 kg   SpO2 99%   BMI 21.95 kg/m  Gen:   Awake, no distress  NAD Resp:  Normal effort  MSK:   Moves extremities without difficulty  Other:    Medical Decision Making  Medically screening exam initiated at 8:03 AM.  Appropriate orders placed.  Jung Yurchak was informed that the remainder of the evaluation will be completed by another provider, this initial triage assessment does not replace that evaluation, and the importance of remaining in the ED until their evaluation is complete.  Patient to the ED for evaluation of pain related to his nephrostomy tube.  Chart review reveals that the tube was replaced on 05/10/2023.   Lissa Hoard, [place] 06/24/23 [phone]

## 2023-06-24 NOTE — ED Triage Notes (Signed)
Pt to ed from [facility] for suprapubic bag complaint of pain. Pt was seen late Friday night for the same thing. Pt is caox4, in no acute distress in triage.

## 2023-06-24 NOTE — ED Notes (Addendum)
Called [employer] to provide pickup transportation for patient. Waiting on antibiotic to finish before D/C.

## 2023-06-27 LAB — URINE CULTURE
Culture: >=100000 — AB
Special Requests: NORMAL

## 2023-07-18 ENCOUNTER — Other Ambulatory Visit (INDEPENDENT_AMBULATORY_CARE_PROVIDER_SITE_OTHER): Payer: Self-pay | Admitting: Vascular Surgery

## 2023-07-18 DIAGNOSIS — N186 End stage renal disease: Secondary | ICD-10-CM
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
## 2023-07-19 ENCOUNTER — Ambulatory Visit (INDEPENDENT_AMBULATORY_CARE_PROVIDER_SITE_OTHER): Payer: Medicaid Other

## 2023-07-19 ENCOUNTER — Ambulatory Visit (INDEPENDENT_AMBULATORY_CARE_PROVIDER_SITE_OTHER): Payer: Medicaid Other | Admitting: Vascular Surgery

## 2023-07-19 VITALS — BP 144/79 | HR 70 | Resp 16

## 2023-07-19 DIAGNOSIS — I5042 Chronic combined systolic (congestive) and diastolic (congestive) heart failure: Secondary | ICD-10-CM

## 2023-07-19 DIAGNOSIS — I10 Essential (primary) hypertension: Secondary | ICD-10-CM

## 2023-07-19 DIAGNOSIS — N186 End stage renal disease: Secondary | ICD-10-CM | POA: Diagnosis not present

## 2023-07-19 DIAGNOSIS — E782 Mixed hyperlipidemia: Secondary | ICD-10-CM

## 2023-07-22 ENCOUNTER — Encounter (INDEPENDENT_AMBULATORY_CARE_PROVIDER_SITE_OTHER): Payer: Self-pay | Admitting: Vascular Surgery

## 2023-07-31 ENCOUNTER — Ambulatory Visit: Payer: Medicaid Other | Admitting: Urology

## 2023-08-13 ENCOUNTER — Other Ambulatory Visit: Payer: Self-pay

## 2023-08-13 ENCOUNTER — Ambulatory Visit: Admitting: Radiology

## 2023-08-13 NOTE — Progress Notes (Signed)
 Patient for IR RT Neph Tube Exchange on Tues 08/14/23, I called and spoke with the patient's caretaker, Merry Proud on the phone and gave pre-procedure instructions. Merry Proud was made aware to have the patient here at 12p, NPO after MN prior to procedure as well as driver post procedure/recovery/discharge. Merry Proud stated understanding.  Called 08/08/23

## 2023-08-14 ENCOUNTER — Encounter: Payer: Self-pay | Admitting: Radiology

## 2023-08-14 ENCOUNTER — Ambulatory Visit
Admission: RE | Admit: 2023-08-14 | Discharge: 2023-08-14 | Disposition: A | Discharge status: Home / Self Care | Source: Ambulatory Visit | Attending: Interventional Radiology | Admitting: Interventional Radiology

## 2023-08-14 ENCOUNTER — Other Ambulatory Visit: Payer: Self-pay

## 2023-08-14 DIAGNOSIS — Z8249 Family history of ischemic heart disease and other diseases of the circulatory system: Secondary | ICD-10-CM | POA: Diagnosis not present

## 2023-08-14 DIAGNOSIS — F17210 Nicotine dependence, cigarettes, uncomplicated: Secondary | ICD-10-CM | POA: Diagnosis not present

## 2023-08-14 DIAGNOSIS — I5022 Chronic systolic (congestive) heart failure: Secondary | ICD-10-CM | POA: Insufficient documentation

## 2023-08-14 DIAGNOSIS — T83092A Other mechanical complication of nephrostomy catheter, initial encounter: Secondary | ICD-10-CM | POA: Insufficient documentation

## 2023-08-14 DIAGNOSIS — N186 End stage renal disease: Secondary | ICD-10-CM | POA: Insufficient documentation

## 2023-08-14 DIAGNOSIS — I132 Hypertensive heart and chronic kidney disease with heart failure and with stage 5 chronic kidney disease, or end stage renal disease: Secondary | ICD-10-CM | POA: Insufficient documentation

## 2023-08-14 HISTORY — PX: IR NEPHROSTOMY EXCHANGE RIGHT: IMG6070

## 2023-08-14 MED ORDER — SODIUM CHLORIDE 0.9 % IV SOLN: INTRAVENOUS | Status: DC

## 2023-08-14 MED ORDER — MIDAZOLAM HCL 2 MG/2ML IJ SOLN
INTRAMUSCULAR | Status: AC | PRN
  Administered 2023-08-14: 1 mg via INTRAVENOUS

## 2023-08-14 MED ORDER — FENTANYL CITRATE (PF) 100 MCG/2ML IJ SOLN
INTRAMUSCULAR | Status: AC | PRN
  Administered 2023-08-14: 50 ug via INTRAVENOUS

## 2023-08-14 MED ORDER — LIDOCAINE HCL 1 % IJ SOLN
10.0000 mL | Freq: Once | INTRAMUSCULAR | Status: AC
  Administered 2023-08-14: 10 mL

## 2023-08-14 MED ORDER — FENTANYL CITRATE (PF) 100 MCG/2ML IJ SOLN
INTRAMUSCULAR | Status: AC
  Filled 2023-08-14: qty 2

## 2023-08-14 MED ORDER — MIDAZOLAM HCL 2 MG/2ML IJ SOLN
INTRAMUSCULAR | Status: AC
  Filled 2023-08-14: qty 2

## 2023-08-14 MED ORDER — LIDOCAINE HCL 1 % IJ SOLN
INTRAMUSCULAR | Status: AC
  Filled 2023-08-14: qty 20

## 2023-08-14 MED ORDER — IOHEXOL 300 MG/ML  SOLN: 50.0000 mL | Freq: Once | INTRAMUSCULAR | Status: DC | PRN

## 2023-08-14 NOTE — Procedures (Signed)
 Interventional Radiology Procedure Note  Procedure: Image guided right PCN exchange, new 51F DM  Complications: None  EBL: None    Recommendations: - Routine drain care,     - routine wound care  - continue routine exchanges ~3 months  Signed,  Jimmy Plessinger S. [address], DO
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
## 2023-08-23 ENCOUNTER — Encounter: Payer: Self-pay | Admitting: Podiatry

## 2023-08-23 ENCOUNTER — Ambulatory Visit (INDEPENDENT_AMBULATORY_CARE_PROVIDER_SITE_OTHER): Admitting: Podiatry

## 2023-08-23 DIAGNOSIS — B351 Tinea unguium: Secondary | ICD-10-CM | POA: Diagnosis not present

## 2023-08-23 DIAGNOSIS — N186 End stage renal disease: Secondary | ICD-10-CM

## 2023-08-23 DIAGNOSIS — M79674 Pain in right toe(s): Secondary | ICD-10-CM | POA: Diagnosis not present

## 2023-08-23 DIAGNOSIS — D689 Coagulation defect, unspecified: Secondary | ICD-10-CM

## 2023-08-23 DIAGNOSIS — M79675 Pain in left toe(s): Secondary | ICD-10-CM

## 2023-08-23 DIAGNOSIS — Z992 Dependence on renal dialysis: Secondary | ICD-10-CM

## 2023-08-28 ENCOUNTER — Encounter: Payer: Self-pay | Admitting: Podiatry

## 2023-08-28 NOTE — Progress Notes (Signed)
  Subjective:  Patient ID: Larry Morrison, male    DOB: 1970-09-16,  MRN: [id number]  53 y.o. male presents at risk foot care with h/o clotting disorder, at risk foot care. Patient has h/o ESRD on hemodialysis, and callus(es) right heel and painful mycotic toenails that are difficult to trim. Painful toenails interfere with ambulation. Aggravating factors include wearing enclosed shoe gear. Pain is relieved with periodic professional debridement. Painful calluses are aggravated when weightbearing with and without shoegear. Pain is relieved with periodic professional debridement.  Chief Complaint  Patient presents with   Nail Problem    "Toenails"    New problem(s): None   PCP is [facility], Public house manager.  No Known Allergies  Review of Systems: Negative except as noted in the HPI.   Objective:  Kameren Pargas is a pleasant 53 y.o. male WD, WN in NAD. AAO x 3.  Vascular Examination: Vascular status intact b/l with palpable pedal pulses. CFT immediate b/l. Pedal hair present. No edema. No pain with calf compression b/l. Skin temperature gradient WNL b/l. No varicosities noted. No cyanosis or clubbing noted.  Neurological Examination: Sensation grossly intact b/l with 10 gram monofilament. Vibratory sensation intact b/l.  Dermatological Examination: Pedal skin with normal turgor, texture and tone b/l. No open wounds nor interdigital macerations noted. Toenails 1-5 b/l thick, discolored, elongated with subungual debris and pain on dorsal palpation.   Hyperkeratotic lesion(s) right heel.  No erythema, no edema, no drainage, no fluctuance.  Musculoskeletal Examination: Muscle strength 5/5 to b/l LE.  No pain, crepitus noted b/l. No gross pedal deformities. Patient ambulates independently without assistive aids.   Radiographs: None  Last A1c:       No data to display         Assessment:   1. Pain due to onychomycosis of toenails of both feet   2. Clotting disorder (HCC)   3. ESRD on  hemodialysis [facility])    Plan:  Patient was evaluated and treated. All patient's and/or POA's questions/concerns addressed on today's visit. Toenails 1-5 debrided in length and girth without incident. Continue soft, supportive shoe gear daily. Report any pedal injuries to medical professional. Call office if there are any questions/concerns. -As a courtesy, callus(es) right heel pared utilizing sterile scalpel blade without complication or incident. Total number pared=1. -Patient/POA to call should there be question/concern in the interim.  Return in about 3 months (around 11/22/2023).  Freddie Breech, DPM      [place] LOCATION: [address], [place] [address]                   Office [phone]   [facility] LOCATION: [address] [place], [place] [address] Office [phone]

## 2023-09-10 ENCOUNTER — Emergency Department

## 2023-09-10 ENCOUNTER — Other Ambulatory Visit: Payer: Self-pay

## 2023-09-10 ENCOUNTER — Encounter: Payer: Self-pay | Admitting: Emergency Medicine

## 2023-09-10 ENCOUNTER — Emergency Department
Admission: EM | Admit: 2023-09-10 | Discharge: 2023-09-10 | Disposition: A | Discharge status: Home / Self Care | Attending: Emergency Medicine | Admitting: Emergency Medicine

## 2023-09-10 DIAGNOSIS — I132 Hypertensive heart and chronic kidney disease with heart failure and with stage 5 chronic kidney disease, or end stage renal disease: Secondary | ICD-10-CM | POA: Insufficient documentation

## 2023-09-10 DIAGNOSIS — Z8673 Personal history of transient ischemic attack (TIA), and cerebral infarction without residual deficits: Secondary | ICD-10-CM | POA: Insufficient documentation

## 2023-09-10 DIAGNOSIS — Z720 Tobacco use: Secondary | ICD-10-CM | POA: Diagnosis not present

## 2023-09-10 DIAGNOSIS — I5020 Unspecified systolic (congestive) heart failure: Secondary | ICD-10-CM | POA: Diagnosis not present

## 2023-09-10 DIAGNOSIS — Z992 Dependence on renal dialysis: Secondary | ICD-10-CM | POA: Insufficient documentation

## 2023-09-10 DIAGNOSIS — K8020 Calculus of gallbladder without cholecystitis without obstruction: Secondary | ICD-10-CM

## 2023-09-10 DIAGNOSIS — Z79899 Other long term (current) drug therapy: Secondary | ICD-10-CM | POA: Insufficient documentation

## 2023-09-10 DIAGNOSIS — N186 End stage renal disease: Secondary | ICD-10-CM | POA: Diagnosis not present

## 2023-09-10 DIAGNOSIS — R109 Unspecified abdominal pain: Secondary | ICD-10-CM

## 2023-09-10 DIAGNOSIS — R10A1 Flank pain, right side: Secondary | ICD-10-CM

## 2023-09-10 DIAGNOSIS — N390 Urinary tract infection, site not specified: Secondary | ICD-10-CM | POA: Diagnosis not present

## 2023-09-10 DIAGNOSIS — Z7982 Long term (current) use of aspirin: Secondary | ICD-10-CM | POA: Diagnosis not present

## 2023-09-10 LAB — URINALYSIS, W/ REFLEX TO CULTURE (INFECTION SUSPECTED)
Bilirubin Urine: NEGATIVE
Glucose, UA: NEGATIVE mg/dL
Ketones, ur: NEGATIVE mg/dL
Nitrite: NEGATIVE
Protein, ur: 100 mg/dL — AB
RBC / HPF: >50 RBC/hpf (ref 0–5)
Specific Gravity, Urine: 1.015 (ref 1.005–1.030)
Squamous Epithelial / HPF: 0 /HPF (ref 0–5)
WBC, UA: >50 WBC/hpf (ref 0–5)
pH: 7 (ref 5.0–8.0)

## 2023-09-10 LAB — CBC WITH DIFFERENTIAL/PLATELET
Abs Immature Granulocytes: 0.02 10*3/uL (ref 0.00–0.07)
Basophils Absolute: 0.1 10*3/uL (ref 0.0–0.1)
Basophils Relative: 1 %
Eosinophils Absolute: 0.3 10*3/uL (ref 0.0–0.5)
Eosinophils Relative: 4 %
HCT: 30.6 % — ABNORMAL LOW (ref 39.0–52.0)
Hemoglobin: 9.9 g/dL — ABNORMAL LOW (ref 13.0–17.0)
Immature Granulocytes: 0 %
Lymphocytes Relative: 16 %
Lymphs Abs: 1.3 10*3/uL (ref 0.7–4.0)
MCH: 31.9 pg (ref 26.0–34.0)
MCHC: 32.4 g/dL (ref 30.0–36.0)
MCV: 98.7 fL (ref 80.0–100.0)
Monocytes Absolute: 0.6 10*3/uL (ref 0.1–1.0)
Monocytes Relative: 8 %
Neutro Abs: 5.5 10*3/uL (ref 1.7–7.7)
Neutrophils Relative %: 71 %
Platelets: 158 10*3/uL (ref 150–400)
RBC: 3.1 MIL/uL — ABNORMAL LOW (ref 4.22–5.81)
RDW: 14 % (ref 11.5–15.5)
WBC: 7.7 10*3/uL (ref 4.0–10.5)
nRBC: 0 % (ref 0.0–0.2)

## 2023-09-10 LAB — COMPREHENSIVE METABOLIC PANEL WITH GFR
ALT: 32 U/L (ref 0–44)
AST: 24 U/L (ref 15–41)
Albumin: 2.7 g/dL — ABNORMAL LOW (ref 3.5–5.0)
Alkaline Phosphatase: 46 U/L (ref 38–126)
Anion gap: 11 (ref 5–15)
BUN: 52 mg/dL — ABNORMAL HIGH (ref 6–20)
CO2: 25 mmol/L (ref 22–32)
Calcium: 8.5 mg/dL — ABNORMAL LOW (ref 8.9–10.3)
Chloride: 105 mmol/L (ref 98–111)
Creatinine, Ser: 10.62 mg/dL — ABNORMAL HIGH (ref 0.61–1.24)
GFR, Estimated: 5 mL/min — ABNORMAL LOW (ref >60)
Glucose, Bld: 83 mg/dL (ref 70–99)
Potassium: 3.9 mmol/L (ref 3.5–5.1)
Sodium: 141 mmol/L (ref 135–145)
Total Bilirubin: 0.6 mg/dL (ref 0.0–1.2)
Total Protein: 7.2 g/dL (ref 6.5–8.1)

## 2023-09-10 LAB — PROCALCITONIN: Procalcitonin: 0.6 ng/mL

## 2023-09-10 LAB — LACTIC ACID, PLASMA: Lactic Acid, Venous: 0.8 mmol/L (ref 0.5–1.9)

## 2023-09-10 LAB — LIPASE, BLOOD: Lipase: 33 U/L (ref 11–51)

## 2023-09-10 MED ORDER — SULFAMETHOXAZOLE-TRIMETHOPRIM 800-160 MG PO TABS
1.0000 | ORAL_TABLET | Freq: Once | ORAL | Status: AC
  Administered 2023-09-10: 1 via ORAL
  Filled 2023-09-10: qty 1

## 2023-09-10 MED ORDER — ONDANSETRON HCL 4 MG/2ML IJ SOLN
4.0000 mg | Freq: Once | INTRAMUSCULAR | Status: AC
  Administered 2023-09-10: 4 mg via INTRAVENOUS
  Filled 2023-09-10: qty 2

## 2023-09-10 MED ORDER — SULFAMETHOXAZOLE-TRIMETHOPRIM 400-80 MG PO TABS: 1.0000 | ORAL_TABLET | Freq: Two times a day (BID) | ORAL | 0 refills | Status: DC

## 2023-09-10 MED ORDER — OXYCODONE HCL 5 MG PO TABS
5.0000 mg | ORAL_TABLET | Freq: Once | ORAL | Status: AC
  Administered 2023-09-10: 5 mg via ORAL
  Filled 2023-09-10: qty 1

## 2023-09-10 MED ORDER — IOHEXOL 300 MG/ML  SOLN
100.0000 mL | Freq: Once | INTRAMUSCULAR | Status: AC | PRN
Start: 2023-09-10 — End: 2023-09-10
  Administered 2023-09-10: 100 mL via INTRAVENOUS

## 2023-09-10 MED ORDER — CIPROFLOXACIN IN D5W 400 MG/200ML IV SOLN
400.0000 mg | Freq: Once | INTRAVENOUS | Status: AC
  Administered 2023-09-10: 400 mg via INTRAVENOUS
  Filled 2023-09-10: qty 200

## 2023-09-10 MED ORDER — ONDANSETRON 4 MG PO TBDP: 4.0000 mg | ORAL_TABLET | Freq: Four times a day (QID) | ORAL | 0 refills | Status: DC | PRN

## 2023-09-10 MED ORDER — OXYCODONE HCL 5 MG PO TABS: 5.0000 mg | ORAL_TABLET | Freq: Four times a day (QID) | ORAL | 0 refills | Status: DC | PRN

## 2023-09-10 MED ORDER — FENTANYL CITRATE PF 50 MCG/ML IJ SOSY
50.0000 ug | PREFILLED_SYRINGE | Freq: Once | INTRAMUSCULAR | Status: AC
  Administered 2023-09-10: 50 ug via INTRAVENOUS
  Filled 2023-09-10: qty 1

## 2023-09-10 MED ORDER — VANCOMYCIN HCL IN DEXTROSE 1-5 GM/200ML-% IV SOLN
1000.0000 mg | Freq: Once | INTRAVENOUS | Status: AC
  Administered 2023-09-10: 1000 mg via INTRAVENOUS
  Filled 2023-09-10: qty 200

## 2023-09-10 NOTE — Progress Notes (Signed)
 ED Pharmacy Antibiotic Sign Off An antibiotic consult was received from an ED provider for Vancomycin  per pharmacy dosing for hx of MRSA. A chart review was completed to assess appropriateness.   The following one time order(s) were placed:  Vancomycin  1000 mg per pt wt: 49.8 kg  Further antibiotic and/or antibiotic pharmacy consults should be ordered by the admitting provider if indicated.   Thank you for allowing pharmacy to be a part of this patient's care.   Thank you, Coretta Dexter, PharmD, [facility] 09/10/2023 4:10 AM

## 2023-09-10 NOTE — ED Triage Notes (Signed)
 To ER from group home via EMS for report of right flank pain where nephrostomy tube is located. Reports has been flowing fine. Pain started suddenly around 130a.  Due for dialysis this morning, fistula to left arm.

## 2023-09-10 NOTE — Discharge Instructions (Addendum)
 You are being provided a prescription for opiates (also known as narcotics) for pain control.  Opiates can be addictive and should only be used when absolutely necessary for pain control when other alternatives do not work.  We recommend you only use them for the recommended amount of time and only as prescribed.  Please do not take with other sedative medications or alcohol.  Please do not drive, operate machinery, make important decisions while taking opiates.  Please note that these medications can be addictive and have high abuse potential.  Patients can become addicted to narcotics after only taking them for a few days.  Please keep these medications locked away from children, teenagers or any family members with history of substance abuse.  Narcotic pain medicine may also make you constipated.  You may use over-the-counter medications such as MiraLAX , Colace to prevent constipation.  If you become constipated, you may use over-the-counter enemas as needed.  Itching and nausea are also common side effects of narcotic pain medication.  If you develop uncontrolled vomiting or a rash, please stop these medications and seek medical care.  Your CT scan showed that your nephrostomy tube was in place and that the swelling of your kidney called hydronephrosis had improved compared to February 2025.  You did have findings consistent with a chronic urinary tract infection for which we are starting you on an antibiotic.  You received IV antibiotics in the ED.  We recommend that you start your prescription Tuesday, April 22 in the morning twice a day for 1 week.  Your CT scan also showed thickening of the left bladder wall.  We recommend close follow-up with urology as we cannot exclude bladder cancer.  Your CT scan also showed gallstones which can cause right upper abdominal pain and back pain.  I recommend a low-fat diet and follow-up with general surgery if you continue to have discomfort.
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
## 2023-09-11 ENCOUNTER — Ambulatory Visit (INDEPENDENT_AMBULATORY_CARE_PROVIDER_SITE_OTHER): Admitting: Physician Assistant

## 2023-09-11 ENCOUNTER — Telehealth: Payer: Self-pay | Admitting: Emergency Medicine

## 2023-09-11 ENCOUNTER — Ambulatory Visit: Admitting: Urology

## 2023-09-11 VITALS — BP 136/75 | HR 85 | Ht 64.0 in | Wt 106.0 lb

## 2023-09-11 DIAGNOSIS — R3129 Other microscopic hematuria: Secondary | ICD-10-CM | POA: Diagnosis not present

## 2023-09-11 DIAGNOSIS — N390 Urinary tract infection, site not specified: Secondary | ICD-10-CM | POA: Diagnosis not present

## 2023-09-11 MED ORDER — OXYCODONE HCL 5 MG PO TABS: 5.0000 mg | ORAL_TABLET | Freq: Four times a day (QID) | ORAL | 0 refills | Status: DC | PRN

## 2023-09-11 NOTE — Telephone Encounter (Signed)
 Rx for oxycodone  cancelled at the [facility] [address].
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
## 2023-09-11 NOTE — Telephone Encounter (Signed)
Wrong pharm

## 2023-09-12 LAB — URINE CULTURE: Culture: >=100000 — AB

## 2023-09-15 LAB — CULTURE, BLOOD (ROUTINE X 2)
Culture: NO GROWTH
Culture: NO GROWTH

## 2023-09-17 ENCOUNTER — Telehealth: Payer: Self-pay

## 2023-09-17 NOTE — Telephone Encounter (Signed)
 Pt. Legal Guardian called in today (DSS- Zyannah). States that patient is not able to make Legal or Medical Decisions. She would like to be called and had Cystoscopy procedure you suggested discussed with her. She said she would need to know pros and cons of doing procedure vs. Not. Advised that you were out of the office today but I would have you reach out to her soon.

## 2023-09-19 ENCOUNTER — Emergency Department

## 2023-09-19 ENCOUNTER — Other Ambulatory Visit: Payer: Self-pay

## 2023-09-19 ENCOUNTER — Emergency Department
Admission: EM | Admit: 2023-09-19 | Discharge: 2023-09-19 | Disposition: A | Discharge status: Home / Self Care | Attending: Emergency Medicine | Admitting: Emergency Medicine

## 2023-09-19 DIAGNOSIS — R110 Nausea: Secondary | ICD-10-CM | POA: Diagnosis present

## 2023-09-19 DIAGNOSIS — R001 Bradycardia, unspecified: Secondary | ICD-10-CM | POA: Diagnosis not present

## 2023-09-19 DIAGNOSIS — N3000 Acute cystitis without hematuria: Secondary | ICD-10-CM | POA: Diagnosis not present

## 2023-09-19 DIAGNOSIS — D649 Anemia, unspecified: Secondary | ICD-10-CM | POA: Diagnosis not present

## 2023-09-19 DIAGNOSIS — Z992 Dependence on renal dialysis: Secondary | ICD-10-CM | POA: Diagnosis not present

## 2023-09-19 DIAGNOSIS — R519 Headache, unspecified: Secondary | ICD-10-CM | POA: Insufficient documentation

## 2023-09-19 DIAGNOSIS — R7989 Other specified abnormal findings of blood chemistry: Secondary | ICD-10-CM | POA: Diagnosis not present

## 2023-09-19 DIAGNOSIS — N186 End stage renal disease: Secondary | ICD-10-CM | POA: Diagnosis not present

## 2023-09-19 DIAGNOSIS — I509 Heart failure, unspecified: Secondary | ICD-10-CM | POA: Insufficient documentation

## 2023-09-19 LAB — URINALYSIS, W/ REFLEX TO CULTURE (INFECTION SUSPECTED)
Bilirubin Urine: NEGATIVE
Glucose, UA: NEGATIVE mg/dL
Ketones, ur: NEGATIVE mg/dL
Nitrite: NEGATIVE
Protein, ur: 100 mg/dL — AB
RBC / HPF: >50 RBC/hpf (ref 0–5)
Specific Gravity, Urine: 1.013 (ref 1.005–1.030)
WBC, UA: >50 WBC/hpf (ref 0–5)
pH: 7 (ref 5.0–8.0)

## 2023-09-19 LAB — CBC WITH DIFFERENTIAL/PLATELET
Abs Immature Granulocytes: 0.01 10*3/uL (ref 0.00–0.07)
Basophils Absolute: 0.1 10*3/uL (ref 0.0–0.1)
Basophils Relative: 1 %
Eosinophils Absolute: 0.3 10*3/uL (ref 0.0–0.5)
Eosinophils Relative: 6 %
HCT: 32.4 % — ABNORMAL LOW (ref 39.0–52.0)
Hemoglobin: 10.1 g/dL — ABNORMAL LOW (ref 13.0–17.0)
Immature Granulocytes: 0 %
Lymphocytes Relative: 27 %
Lymphs Abs: 1.4 10*3/uL (ref 0.7–4.0)
MCH: 31.1 pg (ref 26.0–34.0)
MCHC: 31.2 g/dL (ref 30.0–36.0)
MCV: 99.7 fL (ref 80.0–100.0)
Monocytes Absolute: 0.5 10*3/uL (ref 0.1–1.0)
Monocytes Relative: 9 %
Neutro Abs: 2.9 10*3/uL (ref 1.7–7.7)
Neutrophils Relative %: 57 %
Platelets: 184 10*3/uL (ref 150–400)
RBC: 3.25 MIL/uL — ABNORMAL LOW (ref 4.22–5.81)
RDW: 13.6 % (ref 11.5–15.5)
WBC: 5.1 10*3/uL (ref 4.0–10.5)
nRBC: 0 % (ref 0.0–0.2)

## 2023-09-19 LAB — COMPREHENSIVE METABOLIC PANEL WITH GFR
ALT: 41 U/L (ref 0–44)
AST: 43 U/L — ABNORMAL HIGH (ref 15–41)
Albumin: 2.8 g/dL — ABNORMAL LOW (ref 3.5–5.0)
Alkaline Phosphatase: 44 U/L (ref 38–126)
Anion gap: 8 (ref 5–15)
BUN: 46 mg/dL — ABNORMAL HIGH (ref 6–20)
CO2: 30 mmol/L (ref 22–32)
Calcium: 8.3 mg/dL — ABNORMAL LOW (ref 8.9–10.3)
Chloride: 101 mmol/L (ref 98–111)
Creatinine, Ser: 9.58 mg/dL — ABNORMAL HIGH (ref 0.61–1.24)
GFR, Estimated: 6 mL/min — ABNORMAL LOW (ref >60)
Glucose, Bld: 70 mg/dL (ref 70–99)
Potassium: 4 mmol/L (ref 3.5–5.1)
Sodium: 139 mmol/L (ref 135–145)
Total Bilirubin: 0.5 mg/dL (ref 0.0–1.2)
Total Protein: 7.5 g/dL (ref 6.5–8.1)

## 2023-09-19 LAB — TROPONIN I (HIGH SENSITIVITY)
Troponin I (High Sensitivity): 24 ng/L — ABNORMAL HIGH (ref <18)
Troponin I (High Sensitivity): 26 ng/L — ABNORMAL HIGH (ref <18)

## 2023-09-19 LAB — LIPASE, BLOOD: Lipase: 106 U/L — ABNORMAL HIGH (ref 11–51)

## 2023-09-19 MED ORDER — SULFAMETHOXAZOLE-TRIMETHOPRIM 800-160 MG PO TABS: 1.0000 | ORAL_TABLET | Freq: Two times a day (BID) | ORAL | 0 refills | Status: DC

## 2023-09-19 MED ORDER — ONDANSETRON HCL 4 MG PO TABS: 4.0000 mg | ORAL_TABLET | Freq: Four times a day (QID) | ORAL | 0 refills | Status: DC | PRN

## 2023-09-19 MED ORDER — ONDANSETRON HCL 4 MG PO TABS: 4.0000 mg | ORAL_TABLET | Freq: Four times a day (QID) | ORAL | 0 refills | Status: AC | PRN

## 2023-09-19 MED ORDER — ONDANSETRON HCL 4 MG/2ML IJ SOLN
4.0000 mg | Freq: Once | INTRAMUSCULAR | Status: DC
  Filled 2023-09-19: qty 2

## 2023-09-19 MED ORDER — ONDANSETRON 4 MG PO TBDP
4.0000 mg | ORAL_TABLET | Freq: Once | ORAL | Status: AC
  Administered 2023-09-19: 4 mg via ORAL
  Filled 2023-09-19: qty 1

## 2023-09-19 MED ORDER — SULFAMETHOXAZOLE-TRIMETHOPRIM 800-160 MG PO TABS: 1.0000 | ORAL_TABLET | Freq: Two times a day (BID) | ORAL | 0 refills | Status: AC

## 2023-09-19 NOTE — ED Provider Notes (Signed)
 [facility] Provider Note    Event Date/Time   First MD Initiated Contact with Patient 09/19/23 1041     (approximate)   History   Nausea   HPI  Larry Morrison is a 53 year old male with history of prior craniotomy for ruptured aneurysm, ESRD, CHF, CKD, chronic right nephrostomy tube presenting to the emergency department for evaluation of nausea.  Reports nausea last night and this morning.  Denies headache, abdominal pain, vomiting, numbness, tingling, other complaints.     Physical Exam   Triage Vital Signs: ED Triage Vitals  Encounter Vitals Group     BP 09/19/23 1045 (!) 159/95     Systolic BP Percentile --      Diastolic BP Percentile --      Pulse Rate 09/19/23 1045 (!) 58     Resp 09/19/23 1045 16     Temp 09/19/23 1043 97.7 F (36.5 C)     Temp Source 09/19/23 1043 Oral     SpO2 09/19/23 1045 100 %     Weight 09/19/23 1043 120 lb (54.4 kg)     Height 09/19/23 1043 5\' 4"  (1.626 m)     Head Circumference --      Peak Flow --      Pain Score 09/19/23 1043 0     Pain Loc --      Pain Education --      Exclude from Growth Chart --     Most recent vital signs: Vitals:   09/19/23 1045 09/19/23 1330  BP: (!) 159/95 (!) 150/73  Pulse: (!) 58 (!) 50  Resp: 16 13  Temp:    SpO2: 100% 100%     General: Awake, interactive  CV:  Bradycardic with regular rhythm, normal peripheral perfusion Resp:  Unlabored respirations, lungs good auscultation Abd:  Nondistended, soft, nontender Neuro:  Symmetric facial movement, fluid speech, symmetric strength of bilateral upper and lower extremities, moving spontaneously   ED Results / Procedures / Treatments   Labs (all labs ordered are listed, but only abnormal results are displayed) Labs Reviewed  CBC WITH DIFFERENTIAL/PLATELET - Abnormal; Notable for the following components:      Result Value   RBC 3.25 (*)    Hemoglobin 10.1 (*)    HCT 32.4 (*)    All other components within normal  limits  COMPREHENSIVE METABOLIC PANEL WITH GFR - Abnormal; Notable for the following components:   BUN 46 (*)    Creatinine, Ser 9.58 (*)    Calcium  8.3 (*)    Albumin  2.8 (*)    AST 43 (*)    GFR, Estimated 6 (*)    All other components within normal limits  LIPASE, BLOOD - Abnormal; Notable for the following components:   Lipase 106 (*)    All other components within normal limits  URINALYSIS, W/ REFLEX TO CULTURE (INFECTION SUSPECTED) - Abnormal; Notable for the following components:   Color, Urine YELLOW (*)    APPearance TURBID (*)    Hgb urine dipstick LARGE (*)    Protein, ur 100 (*)    Leukocytes,Ua MODERATE (*)    Bacteria, UA MANY (*)    All other components within normal limits  TROPONIN I (HIGH SENSITIVITY) - Abnormal; Notable for the following components:   Troponin I (High Sensitivity) 24 (*)    All other components within normal limits  TROPONIN I (HIGH SENSITIVITY) - Abnormal; Notable for the following components:   Troponin I (High Sensitivity) 26 (*)  All other components within normal limits  URINE CULTURE     EKG EKG independently reviewed interpreted by myself (ER attending) demonstrates:  EKG demonstrates sinus bradycardia at a rate of 47, PR 158, QRS 106, QTc 489, no acute ST changes  RADIOLOGY Imaging independently reviewed and interpreted by myself demonstrates:  CT head without acute findings, does note sinus inflammation though patient without complaints of sinus pain. CT abdomen pelvis with small pleural effusion, stable nephrostomy tube and hydronephrosis with findings of chronic thickening as well as irregular thickening of the bladder for which radiology recommends cystoscopy.  Patient is established with urology.  Formal Radiology Read:  CT ABDOMEN PELVIS WO CONTRAST Result Date: 09/19/2023 CLINICAL DATA:  Acute nausea. EXAM: CT ABDOMEN AND PELVIS WITHOUT CONTRAST TECHNIQUE: Multidetector CT imaging of the abdomen and pelvis was performed  following the standard protocol without IV contrast. RADIATION DOSE REDUCTION: This exam was performed according to the departmental dose-optimization program which includes automated exposure control, adjustment of the mA and/or kV according to patient size and/or use of iterative reconstruction technique. COMPARISON:  September 10, 2023. FINDINGS: Lower chest: Small left pleural effusion is noted with minimal adjacent subsegmental atelectasis. Hepatobiliary: Cholelithiasis. No biliary dilatation. Liver is unremarkable on these unenhanced images. Pancreas: Unremarkable. No pancreatic ductal dilatation or surrounding inflammatory changes. Spleen: Normal in size without focal abnormality. Adrenals/Urinary Tract: Adrenal glands appear normal. Stable right nephrostomy is noted with stable mild hydronephrosis and renal atrophy. Stable ureteral wall thickening is noted. Status post left nephrectomy. There is continued presence irregular focal wall thickening with calcifications along the left posterior aspect of urinary bladder. Stomach/Bowel: Stomach is within normal limits. Appendix appears normal. No evidence of bowel wall thickening, distention, or inflammatory changes. Large amount of stool is noted in the rectum. Vascular/Lymphatic: Aortic atherosclerosis. No enlarged abdominal or pelvic lymph nodes. Reproductive: Prostate is unremarkable. Other: No ascites or hernia. Musculoskeletal: No acute or significant osseous findings. IMPRESSION: Small left pleural effusion with minimal adjacent subsegmental atelectasis. Cholelithiasis. Stable right nephrostomy with stable mild right hydronephrosis and right ureteral wall thickening suggesting chronic inflammation. Continued presence of irregular focal wall thickening with calcifications along left posterior aspect of urinary bladder. Cystoscopy is recommended to rule out neoplasm. Aortic Atherosclerosis (ICD10-I70.0). Electronically Signed   By: Rosalene Colon M.D.   On:  09/19/2023 12:10   CT Head Wo Contrast Result Date: 09/19/2023 CLINICAL DATA:  53 year old male with headache, dialysis patient. Nausea, malaise. EXAM: CT HEAD WITHOUT CONTRAST TECHNIQUE: Contiguous axial images were obtained from the base of the skull through the vertex without intravenous contrast. RADIATION DOSE REDUCTION: This exam was performed according to the departmental dose-optimization program which includes automated exposure control, adjustment of the mA and/or kV according to patient size and/or use of iterative reconstruction technique. COMPARISON:  Head CT 06/30/2021. 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 Encephalomalacia tracks into the posterosuperior left frontal gyrus as before. Stable gray-white matter differentiation throughout the brain. Stable cerebral volume. No superimposed No midline shift, ventriculomegaly, mass effect, evidence of mass lesion, intracranial hemorrhage or evidence of cortically based acute infarction. Vascular: No suspicious intracranial vascular hyperdensity. Skull: Chronic left posterior craniotomy. No acute osseous abnormality identified. Sinuses/Orbits: Widespread paranasal sinus mucosal thickening and opacification is new since 2023. No definite layering sinus fluid. Tympanic cavities and mastoids remain well aerated. Other: No acute orbit or scalp soft tissue finding. IMPRESSION: 1.  No acute intracranial abnormality. Stable non contrast CT appearance of previous left posterior craniotomy, intracranial vascular clipping, posterior left hemisphere cystic encephalomalacia. 2. Widespread paranasal sinus inflammation is new since 2023. Consider acute sinusitis in the appropriate clinical setting. Electronically Signed   By: Marlise Simpers M.D.   On: 09/19/2023 12:08    PROCEDURES:  Critical Care performed:  No  Procedures   MEDICATIONS ORDERED IN ED: Medications  ondansetron  (ZOFRAN -ODT) disintegrating tablet 4 mg (4 mg Oral Given 09/19/23 1323)     IMPRESSION / MDM / ASSESSMENT AND PLAN / ED COURSE  I reviewed the triage vital signs and the nursing notes.  Differential diagnosis includes, but is not limited to, anemia, electrolyte abnormality, intracranial bleed, UTI, complication from nephrostomy tube or other acute intra-abdominal process  Patient's presentation is most consistent with acute presentation with potential threat to life or bodily function.  52 year old male presenting with nausea.  Stable vitals on presentation.Labs with normal white blood cell count, stable anemia.  Creatinine at baseline consistent with known history of ESRD, reassuring potassium at 4.  No evidence of volume overload.  Lipase slightly elevated, less than 3 times upper limit of normal, not consistent with pancreatitis.  Troponin mildly elevated at 24, suspect possible chronically, will obtain repeat to ensure not significantly uptrending.  3:54 PM Repeat troponin stable. Patient tolerating PO after zofran . Urine concerning for infection though difficult to interpret given his history of chronic UTIs.  Will send urine culture.  At most recent urology visit was improving on Bactrim .  Most recent urine culture resistant to Macrobid , otherwise pansensitive.  Discussed with patient, would like to trial another short course of Bactrim .  Will send prescription for this to his pharmacy.  He is comfortable discharge home.  Strict return precautions provided.      FINAL CLINICAL IMPRESSION(S) / ED DIAGNOSES   Final diagnoses:  Nausea  Acute cystitis without hematuria     Rx / DC Orders   ED Discharge Orders          Ordered    ondansetron  (ZOFRAN ) 4 MG tablet  Every 6 hours PRN        09/19/23 1554    sulfamethoxazole -trimethoprim  (BACTRIM  DS) 800-160 MG tablet  2 times daily        09/19/23 1554              Note:  This document was prepared using Dragon voice recognition software and may include unintentional dictation errors.   Claria Crofts, MD 09/19/23 [phone]

## 2023-09-19 NOTE — ED Triage Notes (Addendum)
 BIBEMS, coming from [place] care home. C/o nausea and "not feeling well" that started this AM. MWF Dialysis. Nephrostomy on R side. Due today for dialysis, fistula to L arm. VSS GCS 15

## 2023-09-19 NOTE — Telephone Encounter (Signed)
 I returned Larry Morrison's call this morning. We discussed that [name] declined cysto, which I offered in the setting of microscopic hematuria and bladder wall abnormalities on CT.  We discussed the pros and cons of cystoscopy including more definitive evaluation of any bladder pathology, which could guide any necessary subsequent procedures or treatment, as well as the discomfort of undergoing a 2-minute in-office test.  We also discussed the cons of declining cystoscopy, which would include the inability to identify a possible bladder cancer, which could progress in the absence of treatment.  She will speak with her supervisor and call us  back.

## 2023-09-19 NOTE — Discharge Instructions (Addendum)
 Your testing today was reassuring and the emergency cause for your symptoms.  Your urine was concerning for infection, though some of this may be chronic.  I have included a prescription for an antibiotic to your pharmacy.  I have also included a prescription for as needed nausea medication.  Please take these as directed.  Return to the ER for any new or worsening symptoms.

## 2023-09-21 ENCOUNTER — Other Ambulatory Visit: Payer: Self-pay

## 2023-09-21 ENCOUNTER — Telehealth (INDEPENDENT_AMBULATORY_CARE_PROVIDER_SITE_OTHER): Payer: Self-pay

## 2023-09-21 ENCOUNTER — Emergency Department
Admission: EM | Admit: 2023-09-21 | Discharge: 2023-09-21 | Disposition: A | Discharge status: Home / Self Care | Attending: Emergency Medicine | Admitting: Emergency Medicine

## 2023-09-21 DIAGNOSIS — N186 End stage renal disease: Secondary | ICD-10-CM | POA: Insufficient documentation

## 2023-09-21 DIAGNOSIS — I120 Hypertensive chronic kidney disease with stage 5 chronic kidney disease or end stage renal disease: Secondary | ICD-10-CM | POA: Insufficient documentation

## 2023-09-21 DIAGNOSIS — T82590A Other mechanical complication of surgically created arteriovenous fistula, initial encounter: Secondary | ICD-10-CM | POA: Diagnosis present

## 2023-09-21 DIAGNOSIS — T82868A Thrombosis of vascular prosthetic devices, implants and grafts, initial encounter: Secondary | ICD-10-CM

## 2023-09-21 DIAGNOSIS — Z992 Dependence on renal dialysis: Secondary | ICD-10-CM | POA: Diagnosis not present

## 2023-09-21 LAB — BASIC METABOLIC PANEL WITH GFR
Anion gap: 13 (ref 5–15)
BUN: 63 mg/dL — ABNORMAL HIGH (ref 6–20)
CO2: 29 mmol/L (ref 22–32)
Calcium: 8.3 mg/dL — ABNORMAL LOW (ref 8.9–10.3)
Chloride: 101 mmol/L (ref 98–111)
Creatinine, Ser: 13.2 mg/dL — ABNORMAL HIGH (ref 0.61–1.24)
GFR, Estimated: 4 mL/min — ABNORMAL LOW (ref >60)
Glucose, Bld: 92 mg/dL (ref 70–99)
Potassium: 3.9 mmol/L (ref 3.5–5.1)
Sodium: 143 mmol/L (ref 135–145)

## 2023-09-21 LAB — CBC
HCT: 31.8 % — ABNORMAL LOW (ref 39.0–52.0)
Hemoglobin: 9.8 g/dL — ABNORMAL LOW (ref 13.0–17.0)
MCH: 30.6 pg (ref 26.0–34.0)
MCHC: 30.8 g/dL (ref 30.0–36.0)
MCV: 99.4 fL (ref 80.0–100.0)
Platelets: 149 10*3/uL — ABNORMAL LOW (ref 150–400)
RBC: 3.2 MIL/uL — ABNORMAL LOW (ref 4.22–5.81)
RDW: 13.5 % (ref 11.5–15.5)
WBC: 4.8 10*3/uL (ref 4.0–10.5)
nRBC: 0 % (ref 0.0–0.2)

## 2023-09-21 LAB — URINE CULTURE: Culture: NO GROWTH

## 2023-09-21 NOTE — Telephone Encounter (Signed)
 Spoke with Normand Beckwith and she was given the pre-procedure instructions for the patient's left arm declot and possible permcath insertion on 09/24/23 with a 12:30 pm arrival time to the [facility]. This will be sent to Mychart.

## 2023-09-21 NOTE — Progress Notes (Signed)
   09/21/23 1315  Spiritual Encounters  Type of Visit Initial  Care provided to: Patient  Conversation partners present during encounter Nurse  Reason for visit Routine spiritual support  OnCall Visit No   Chaplain engaged patient in conversation and offered a pillow and warm blanket.  Patient was glad to receive both.    Rev. Rana M. Treasure Friendly Resident [facility]

## 2023-09-21 NOTE — ED Triage Notes (Signed)
 Pt comes via EMS with c/o dialysis porth problem from [facility]. Pt port is clotted. Pt hasn't had dialysis since Monday.   Pt was just dc from hospital yesterday.

## 2023-09-21 NOTE — Discharge Instructions (Signed)
 You have been seen in the emergency department for a clogged AV fistula.  You will be receiving a phone call from vascular surgery to arrange an appointment on Monday to declog the fistula.  Return immediately to the emergency department if you develop any shortness of breath or any other symptom personally concerning to yourself.

## 2023-09-21 NOTE — ED Provider Notes (Signed)
 [facility] Provider Note    Event Date/Time   First MD Initiated Contact with Patient 09/21/23 [phone]     (approximate)  History   Chief Complaint: Vascular Access Problem  HPI  Brevan Dimattia is a 53 y.o. 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  According to the patient he last received dialysis on Monday as he was in the ER on Wednesday for nausea.  Patient was prescribed Bactrim  discharged home ultimately.  He went to dialysis today however his left AV fistula was clogged and they could not access so they sent the patient to the emergency department.  Patient denies any shortness of breath.  Overall reassuring vital signs 100% on room air with a normal respiratory and pulse rate.  Physical Exam   Triage Vital Signs: ED Triage Vitals  Encounter Vitals Group     BP 09/21/23 0952 (!) 163/87     Systolic BP Percentile --      Diastolic BP Percentile --      Pulse Rate 09/21/23 0952 (!) 57     Resp 09/21/23 0952 18     Temp 09/21/23 0952 98 F (36.7 C)     Temp src --      SpO2 09/21/23 0952 100 %     Weight --      Height --      Head Circumference --      Peak Flow --      Pain Score 09/21/23 0939 0     Pain Loc --      Pain Education --      Exclude from Growth Chart --     Most recent vital signs: Vitals:   09/21/23 0952  BP: (!) 163/87  Pulse: (!) 57  Resp: 18  Temp: 98 F (36.7 C)  SpO2: 100%    General: Awake, no distress.  CV:  Good peripheral perfusion.  Regular rate and rhythm  Resp:  Normal effort.  Equal breath sounds bilaterally.  Abd:  No distention.   Other:  Left AV fistula present however no thrill or bruit.   ED Results / Procedures / Treatments   MEDICATIONS ORDERED IN ED: Medications - No data to  display   IMPRESSION / MDM / ASSESSMENT AND PLAN / ED COURSE  I reviewed the triage vital signs and the nursing notes.  Patient's presentation is most consistent with acute presentation with potential threat to life or bodily function.  Patient presents to the emergency department for a clogged left AV fistula.  Patient has no other symptoms.  Patient has not received dialysis since Monday.  We will recheck basic labs.  I reviewed the patient's chart looks like he last underwent a left AV graft cannulation July 2023 by Dr. Vonna Guardian.  We will check labs and discussed with vascular surgery for further options.  Patient's lab work has resulted showing a reassuring CBC.  Chemistry is overall reassuring as well potassium at 3.9.  Patient has no respiratory complaints normal respiratory rate satting 100% on room air.  I spoke to Dr. Vonna Guardian of vascular surgery and they state they will arrange for the patient to have a recannulization on Monday.  Discussed this with the patient he is agreeable to this plan.  I also discussed return precautions for any shortness of breath over  the weekend.  FINAL CLINICAL IMPRESSION(S) / ED DIAGNOSES   Clogged AV fistula  Note:  This document was prepared using Dragon voice recognition software and may include unintentional dictation errors.   Ruth Cove, MD 09/21/23 1256

## 2023-09-21 NOTE — Telephone Encounter (Signed)
 Larry Morrison would like you to call her again if possible. States that she has some more questions about procedure from [facility].

## 2023-09-21 NOTE — ED Notes (Addendum)
 Called Zyannah ([facility] SW) in regards to pt pending d/c, advised to called [facility] admin to arrange transportation. Called Morton Areas (Group Home Admin) [phone] to arrange to transportation back to pt's Group Home. Unsuccessful attempted. Left HIPAA compliant voicemail at this time.

## 2023-09-21 NOTE — ED Notes (Signed)
 Spoke with Morton Areas (Group Home Admin), states that someone should come to get pt shortly. Unable to get an ETA at this time.

## 2023-09-24 ENCOUNTER — Ambulatory Visit
Admission: RE | Admit: 2023-09-24 | Discharge: 2023-09-24 | Disposition: A | Discharge status: Home / Self Care | Attending: Vascular Surgery | Admitting: Vascular Surgery

## 2023-09-24 DIAGNOSIS — N186 End stage renal disease: Secondary | ICD-10-CM | POA: Diagnosis present

## 2023-09-24 LAB — POTASSIUM (ARMC VASCULAR LAB ONLY): Potassium (ARMC vascular lab): 5.4 mmol/L — ABNORMAL HIGH (ref 3.5–5.1)

## 2023-09-24 NOTE — Progress Notes (Addendum)
 Patient was late for procedure, checked potassium per MD order and based on result MD order to reschedule patient. Called legal guardian Kriste Petite) and left message to call back for rescheduling details. Called Lawanda (group home admin) and relayed appointment arrival for Wednesday 5/7 at 7am [facility], NPO after midnight.

## 2023-09-26 ENCOUNTER — Encounter: Admission: RE | Payer: Self-pay | Source: Home / Self Care

## 2023-09-26 ENCOUNTER — Ambulatory Visit: Admit: 2023-09-26 | Admitting: Vascular Surgery

## 2023-09-26 ENCOUNTER — Encounter
Admission: RE | Disposition: A | Discharge status: Nursing Home (Includes ICF) | Payer: Self-pay | Source: Ambulatory Visit | Attending: Vascular Surgery

## 2023-09-26 ENCOUNTER — Encounter: Payer: Self-pay | Admitting: Vascular Surgery

## 2023-09-26 ENCOUNTER — Ambulatory Visit
Admission: RE | Admit: 2023-09-26 | Discharge: 2023-09-26 | Disposition: A | Discharge status: Other Acute Inpatient Hospital | Source: Ambulatory Visit | Attending: Vascular Surgery | Admitting: Vascular Surgery

## 2023-09-26 ENCOUNTER — Ambulatory Visit: Admitting: Surgery

## 2023-09-26 ENCOUNTER — Telehealth: Payer: Self-pay

## 2023-09-26 DIAGNOSIS — Z79899 Other long term (current) drug therapy: Secondary | ICD-10-CM | POA: Insufficient documentation

## 2023-09-26 DIAGNOSIS — I2510 Atherosclerotic heart disease of native coronary artery without angina pectoris: Secondary | ICD-10-CM | POA: Diagnosis not present

## 2023-09-26 DIAGNOSIS — F17210 Nicotine dependence, cigarettes, uncomplicated: Secondary | ICD-10-CM | POA: Insufficient documentation

## 2023-09-26 DIAGNOSIS — Z992 Dependence on renal dialysis: Secondary | ICD-10-CM

## 2023-09-26 DIAGNOSIS — T82868A Thrombosis of vascular prosthetic devices, implants and grafts, initial encounter: Secondary | ICD-10-CM | POA: Diagnosis present

## 2023-09-26 DIAGNOSIS — E1122 Type 2 diabetes mellitus with diabetic chronic kidney disease: Secondary | ICD-10-CM | POA: Insufficient documentation

## 2023-09-26 DIAGNOSIS — Y832 Surgical operation with anastomosis, bypass or graft as the cause of abnormal reaction of the patient, or of later complication, without mention of misadventure at the time of the procedure: Secondary | ICD-10-CM | POA: Insufficient documentation

## 2023-09-26 DIAGNOSIS — Z7984 Long term (current) use of oral hypoglycemic drugs: Secondary | ICD-10-CM | POA: Diagnosis not present

## 2023-09-26 DIAGNOSIS — T82858A Stenosis of vascular prosthetic devices, implants and grafts, initial encounter: Secondary | ICD-10-CM

## 2023-09-26 DIAGNOSIS — I5022 Chronic systolic (congestive) heart failure: Secondary | ICD-10-CM | POA: Diagnosis not present

## 2023-09-26 DIAGNOSIS — N186 End stage renal disease: Secondary | ICD-10-CM | POA: Insufficient documentation

## 2023-09-26 DIAGNOSIS — I132 Hypertensive heart and chronic kidney disease with heart failure and with stage 5 chronic kidney disease, or end stage renal disease: Secondary | ICD-10-CM | POA: Diagnosis not present

## 2023-09-26 HISTORY — PX: A/V SHUNT INTERVENTION: CATH118220

## 2023-09-26 LAB — POTASSIUM (ARMC VASCULAR LAB ONLY): Potassium (ARMC vascular lab): 5.8 mmol/L — ABNORMAL HIGH (ref 3.5–5.1)

## 2023-09-26 SURGERY — A/V SHUNT INTERVENTION
Anesthesia: Moderate Sedation | Laterality: Left

## 2023-09-26 MED ORDER — FAMOTIDINE 20 MG PO TABS: 40.0000 mg | ORAL_TABLET | Freq: Once | ORAL | Status: DC | PRN

## 2023-09-26 MED ORDER — DIPHENHYDRAMINE HCL 50 MG/ML IJ SOLN: 50.0000 mg | Freq: Once | INTRAMUSCULAR | Status: DC | PRN

## 2023-09-26 MED ORDER — MIDAZOLAM HCL 2 MG/ML PO SYRP: 8.0000 mg | ORAL_SOLUTION | Freq: Once | ORAL | Status: DC | PRN

## 2023-09-26 MED ORDER — HYDROMORPHONE HCL 1 MG/ML IJ SOLN: 1.0000 mg | Freq: Once | INTRAMUSCULAR | Status: DC | PRN

## 2023-09-26 MED ORDER — FENTANYL CITRATE (PF) 100 MCG/2ML IJ SOLN
INTRAMUSCULAR | Status: DC | PRN
  Administered 2023-09-26: 50 ug via INTRAVENOUS
  Administered 2023-09-26: 25 ug via INTRAVENOUS

## 2023-09-26 MED ORDER — MIDAZOLAM HCL 2 MG/2ML IJ SOLN
INTRAMUSCULAR | Status: DC | PRN
  Administered 2023-09-26: 2 mg via INTRAVENOUS
  Administered 2023-09-26: 1 mg via INTRAVENOUS

## 2023-09-26 MED ORDER — METHYLPREDNISOLONE SODIUM SUCC 125 MG IJ SOLR: 125.0000 mg | Freq: Once | INTRAMUSCULAR | Status: DC | PRN

## 2023-09-26 MED ORDER — CEFAZOLIN SODIUM-DEXTROSE 1-4 GM/50ML-% IV SOLN
1.0000 g | INTRAVENOUS | Status: AC
  Administered 2023-09-26: 1 g via INTRAVENOUS

## 2023-09-26 MED ORDER — HEPARIN SODIUM (PORCINE) 1000 UNIT/ML IJ SOLN
INTRAMUSCULAR | Status: DC | PRN
  Administered 2023-09-26: 4000 [IU] via INTRAVENOUS

## 2023-09-26 MED ORDER — CEFAZOLIN SODIUM-DEXTROSE 1-4 GM/50ML-% IV SOLN
INTRAVENOUS | Status: AC
  Filled 2023-09-26: qty 50

## 2023-09-26 MED ORDER — SODIUM CHLORIDE 0.9 % IV SOLN: INTRAVENOUS | Status: DC

## 2023-09-26 MED ORDER — FENTANYL CITRATE (PF) 100 MCG/2ML IJ SOLN
INTRAMUSCULAR | Status: AC
  Filled 2023-09-26: qty 2

## 2023-09-26 MED ORDER — IODIXANOL 320 MG/ML IV SOLN
INTRAVENOUS | Status: DC | PRN
Start: 2023-09-26 — End: 2023-09-26
  Administered 2023-09-26: 35 mL

## 2023-09-26 MED ORDER — HYDRALAZINE HCL 20 MG/ML IJ SOLN
INTRAMUSCULAR | Status: AC
  Filled 2023-09-26: qty 1

## 2023-09-26 MED ORDER — MIDAZOLAM HCL 2 MG/2ML IJ SOLN
INTRAMUSCULAR | Status: AC
  Filled 2023-09-26: qty 4

## 2023-09-26 MED ORDER — LIDOCAINE-EPINEPHRINE (PF) 1 %-1:200000 IJ SOLN
INTRAMUSCULAR | Status: DC | PRN
  Administered 2023-09-26 (×2): 5 mL via INTRADERMAL

## 2023-09-26 MED ORDER — HEPARIN SODIUM (PORCINE) 1000 UNIT/ML IJ SOLN
INTRAMUSCULAR | Status: AC
  Filled 2023-09-26: qty 10

## 2023-09-26 SURGICAL SUPPLY — 11 items
BALLOON LUTONIX AV 8X60X75 (BALLOONS) IMPLANT
CATH EMBOLECTOMY 5FR (BALLOONS) IMPLANT
CATH THROMBEC 7F 65 CLEANER15 (CATHETERS) IMPLANT
COVER PROBE ULTRASOUND 5X96 (MISCELLANEOUS) IMPLANT
DEVICE PRESTO INFLATION (MISCELLANEOUS) IMPLANT
DRAPE BRACHIAL (DRAPES) IMPLANT
KIT MICROPUNCTURE VSI 5F STIFF (SHEATH) IMPLANT
PACK ANGIOGRAPHY (CUSTOM PROCEDURE TRAY) ×1 IMPLANT
SHEATH BRITE TIP 7FRX5.5 (SHEATH) IMPLANT
SUT MNCRL AB 4-0 PS2 18 (SUTURE) IMPLANT
WIRE SUPRACORE 190CM (WIRE) IMPLANT
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
## 2023-09-26 NOTE — Telephone Encounter (Signed)
 Zyannah called with questions about a cystoscopy.  Information given.  She is going to talk to the patient again to see if he would like to proceed with the procedure.

## 2023-09-26 NOTE — Progress Notes (Signed)
 Dr. Vonna Guardian aware of potassium of 5.8. No new orders.

## 2023-09-26 NOTE — Discharge Instructions (Signed)
 Shuntogram, Care After Refer to this sheet in the next few weeks. These instructions provide you with information on caring for yourself after your procedure. Your health care provider may also give you more specific instructions. Your treatment has been planned according to current medical practices, but problems sometimes occur. Call your health care provider if you have any problems or questions after your procedure. What can I expect after the procedure? After your procedure, it is typical to have the following:  A small amount of discomfort in the area where the catheters were placed.  A small amount of bruising around the fistula.  Sleepiness and fatigue.  Follow these instructions at home:  Rest at home for the day following your procedure.  Do not drive or operate heavy machinery while taking pain medicine.  Take medicines only as directed by your health care provider.  Do not take baths, swim, or use a hot tub until your health care provider approves. You may shower 24 hours after the procedure or as directed by your health care provider.  There are many different ways to close and cover an incision, including stitches, skin glue, and adhesive strips. Follow your health care provider's instructions on: ? Incision care. ? Bandage (dressing) changes and removal. ? Incision closure removal.  Monitor your dialysis fistula carefully. Contact a health care provider if:  You have drainage, redness, swelling, or pain at your catheter site.  You have a fever.  You have chills. Get help right away if:  You feel weak.  You have trouble balancing.  You have trouble moving your arms or legs.  You have problems with your speech or vision.  You can no longer feel a vibration or buzz when you put your fingers over your dialysis fistula.  The limb that was used for the procedure: ? Swells. ? Is painful. ? Is cold. ? Is discolored, such as blue or pale white. This  information is not intended to replace advice given to you by your health care provider. Make sure you discuss any questions you have with your health care provider. Document Released: 09/22/2013 Document Revised: 10/14/2015 Document Reviewed: 06/27/2013 Elsevier Interactive Patient Education  2018 [employer].

## 2023-09-26 NOTE — Op Note (Signed)
 [facility]    OPERATIVE NOTE   PROCEDURE: 1.  Hypertrophic left brachial artery to axillary vein arteriovenous graft cannulation under ultrasound guidance in both a retrograde and then antegrade fashion crossing 2.  Left arm shuntogram and central venogram 3.  Fogarty embolectomy to the arterial anastomosis and the brachial artery 4.  Mechanical thrombectomy to the AV graft, brachial artery, and axillary vein with the 7 [place] cleaner device 5.  Percutaneous transluminal angioplasty of the venous anastomotic stent in the midportion of the graft with 8 mm diameter by 6 cm length Lutonix drug-coated angioplasty balloon inflated twice  PRE-OPERATIVE DIAGNOSIS: 1. ESRD 2.  Thrombosed left brachial artery to axillary vein arteriovenous graft  POST-OPERATIVE DIAGNOSIS: same as above   SURGEON: Mikki Alexander, MD  ANESTHESIA: local with Moderate Conscious Sedation for approximately 46 minutes using 3 mg of Versed  and 75 mcg of Fentanyl   ESTIMATED BLOOD LOSS: 20 cc  FINDING(S): Thrombosed graft, stenosis in the midportion of the graft in the 70% range and at the venous anastomosis at the proximal edge of the previously placed stent in the 80% range  SPECIMEN(S):  None  CONTRAST: 35 cc  FLUORO TIME: 10.3 minutes  INDICATIONS: Patient is a 53 y.o.male who presents with a thrombosed left brachial artery to axillary vein arteriovenous graft.  The patient is scheduled for an attempted declot and shuntogram.  The patient is aware the risks include but are not limited to: bleeding, infection, thrombosis of the cannulated access, and possible anaphylactic reaction to the contrast.  The patient is aware of the risks of the procedure and elects to proceed forward.  DESCRIPTION: After full informed written consent was obtained, the patient was brought back to the angiography suite and placed supine upon the angiography table.  The patient was connected to monitoring equipment.  Moderate conscious sedation was administered with a face to face encounter with the patient throughout the procedure with my supervision of the RN administering medicines and monitoring the patient's vital signs, pulse oximetry, telemetry and mental status throughout from the start of the procedure until the patient was taken to the recovery room. The left arm was prepped and draped in the standard fashion for a percutaneous access intervention.  Under ultrasound guidance, the left brachial artery and axillary vein arteriovenous graft was cannulated with a micropuncture needle under direct ultrasound guidance due to the pulseless nature of the graft in both an antegrade and a retrograde fashion crossing, and permanent images were performed.  The microwire was advanced and the needle was exchanged for the a microsheath.  I then upsized to a 7 Fr Sheath and imaging was performed.  Hand injections were completed to image the access including the central venous system. This demonstrated no flow within the AV graft.  Based on the images, this patient will need extensive treatment to salvage the graft. I then gave the patient 4000 units of intravenous heparin .  I then placed a Supracore wire into the brachial artery from the retrograde sheath and into the axillary vein from the antegrade sheath.  I initially started by pulling a Fogarty embolectomy balloon #4 from the arterial side of the graft and the brachial artery.  A total of 5 passes were made which resulted in marked improvement in the arterial side of the graft and the brachial artery.  I did run the cleaner device in the arterial side of the graft and the brachial artery as well which resulted in resolution of the thrombosis.  I then removed the retrograde sheath and worked through the antegrade sheath.  Mechanical thrombectomy using the 7 French cleaner device was then performed throughout the graft and into the axillary vein.  3 passes were made resulting in  resolution of the thrombus within the graft.  This uncovered about a 70% stenosis in the midportion of the graft and about an 80% stenosis at the proximal edge of the previously placed stent at the venous anastomosis..   I then elected to treat this with angioplasty.  An 8 mm diameter by 6 cm length Lutonix drug-coated angioplasty balloon was then inflated at the anastomotic stenosis first and inflated up to 6 atm for 1 minute.  It was then pulled back to the midportion of the graft to treat the mid graft stenosis and inflated to 10 atm for 1 minute.  Completion imaging showed marked improvement with less than 20% residual stenosis in both locations.  There is now brisk flow throughout the graft.    Based on the completion imaging, no further intervention is necessary.  The wire and balloon were removed from the sheath.  A 4-0 Monocryl purse-string suture was sewn around the sheath.  The sheath was removed while tying down the suture.  A sterile bandage was applied to the puncture site.  COMPLICATIONS: None  CONDITION: Stable   Mikki Alexander 09/26/2023 9:16 AM   This note was created with Dragon Medical transcription system. Any errors in dictation are purely unintentional.

## 2023-09-28 NOTE — Telephone Encounter (Signed)
 Call returned. LMOM to call me back.

## 2023-10-01 ENCOUNTER — Ambulatory Visit: Admitting: Surgery

## 2023-10-08 ENCOUNTER — Ambulatory Visit: Admitting: Surgery

## 2023-10-08 ENCOUNTER — Encounter: Payer: Self-pay | Admitting: Surgery

## 2023-10-08 VITALS — BP 131/67 | HR 72 | Temp 98.3°F | Ht 64.0 in | Wt 119.0 lb

## 2023-10-08 DIAGNOSIS — K8020 Calculus of gallbladder without cholecystitis without obstruction: Secondary | ICD-10-CM | POA: Diagnosis not present
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
## 2023-10-08 NOTE — Patient Instructions (Addendum)
 Cholelithiasis  Cholelithiasis happens when gallstones form in the gallbladder. The gallbladder stores bile. Bile is a fluid that helps digest fats. Bile can harden and form into gallstones. If they cause a blockage, they can cause pain (gallbladder attack). What are the causes? This condition may be caused by: Too much bilirubin in the bile. This happens if you have sickle cell anemia. Too much of a fat-like substance (cholesterol) in your bile. Not enough bile salts in your bile. These salts help the body absorb and digest fats. The gallbladder not emptying fully or often enough. This is common in pregnant women. What increases the risk? The following factors may make you more likely to develop this condition: Being older than age 64. Eating a lot of fried foods, fat, and refined carbs (refined carbohydrates). Being male. Being pregnant many times. Using medicines with male hormones in them for a long time. Losing weight fast. Having gallstones in your family. Having health problems, such as diabetes, obesity, Crohn's disease, or liver disease. What are the signs or symptoms? Often, there may be gallstones but no symptoms. These gallstones are called silent gallstones. If a gallstone causes a blockage, you may get sudden pain. The pain: Can be in the upper right part of your belly (abdomen). Normally comes at night or after you eat. Can last an hour or more. Can spread to your right shoulder, back, or chest. Can feel like discomfort, burning, or fullness in the upper part of your belly (indigestion). If the blockage lasts more than a few hours, you can get an infection or swelling. You may: Vomit or feel like you may vomit (nauseous). Feel bloated. Have belly pain for 5 hours or more. Feel tender in your belly, often in the upper right part and under your ribs. Have a fever or chills. Have skin or the white parts of your eyes turn yellow (jaundice). Have dark pee (urine) or  pale poop (stool). How is this treated? Treatment for this condition depends on how bad you feel. If you have symptoms, you may need: Home care, if symptoms are not very bad. Do not eat for 12-24 hours. Drink only water and clear liquids. After 1 or 2 days, start to eat simple or clear foods. Try broth and crackers. You may need medicines for pain or stomach upset or both. If you have an infection, you will need antibiotics. A hospital stay, if you have very bad pain or a very bad infection. Surgery to remove your gallbladder. You may need this if: Gallstones keep coming back. You have very bad symptoms. Medicines to break up gallstones. Medicines may be used for 6-12 months. A procedure to find and take out gallstones or to break up gallstones. Follow these instructions at home: Medicines Take over-the-counter and prescription medicines only as told by your doctor. If you were prescribed antibiotics, take them as told by your doctor. Do not stop taking them even if you start to feel better. Ask your doctor if you should avoid driving or using machines while you are taking your medicine. Eating and drinking Drink enough fluid to keep your pee pale yellow. Drink water or clear fluids. This is important when you have pain. Eat healthy foods. Choose: Fewer fatty foods, such as fried foods. Fewer refined carbs. Avoid breads and grains that are highly processed, such as white bread and white rice. Choose whole grains, such as whole-wheat bread and brown rice. More fiber. Almonds, fresh fruit, and beans are healthy sources. General  instructions Keep a healthy weight. Keep all follow-up visits. You may need to see a specialist or a Careers adviser. Where to find more information [employer] [facility] [employer] [facility] [employer] [employer]: [email or web address] Contact a doctor if: You have sudden pain in the upper right part of your belly. Pain might spread to your right shoulder, back, or chest. Your  pain lasts more than 2 hours. You have been diagnosed with gallstones that have no symptoms and you get: Belly pain. Discomfort, burning, or fullness in the upper part of your abdomen. You keep feeling like you may vomit. You have dark pee or pale poop. Get help right away if: You have pain in your abdomen, that: Lasts more than 5 hours. Keeps getting worse. You have a fever or chills. You can't stop vomiting. Your skin or the white parts of your eyes turn yellow. This information is not intended to replace advice given to you by your health care provider. Make sure you discuss any questions you have with your health care provider.  Gallbladder Eating Plan High blood cholesterol, obesity, a sedentary lifestyle, an unhealthy diet, and diabetes are risk factors for developing gallstones. If you have a gallbladder condition, you may have trouble digesting fats and tolerating high fat intake. Eating a low-fat diet can help reduce your symptoms and may be helpful before and after having surgery to remove your gallbladder (cholecystectomy). Your health care provider may recommend that you work with a dietitian to help you reduce the amount of fat in your diet. What are tips for following this plan? General guidelines Limit your fat intake to less than 30% of your total daily calories. If you eat around 1,800 calories each day, this means eating less than 60 grams (g) of fat per day. Fat is an important part of a healthy diet. Eating a low-fat diet can make it hard to maintain a healthy body weight. Ask your dietitian how much fat, calories, and other nutrients you need each day. Eat small, frequent meals throughout the day instead of three large meals. Drink at least 8-10 cups (1.9-2.4 L) of fluid a day. Drink enough fluid to keep your urine pale yellow. If you drink alcohol: Limit how much you have to: 0-1 drink a day for women who are not pregnant. 0-2 drinks a day for men. Know how much  alcohol is in a drink. In the [place] one drink equals one 12 oz bottle of beer (355 mL), one 5 oz glass of wine (148 mL), or one 1 oz glass of hard liquor (44 mL). Reading food labels  Check nutrition facts on food labels for the amount of fat per serving. Choose foods with less than 3 grams of fat per serving. Shopping Choose nonfat and low-fat healthy foods. Look for the words "nonfat," "low-fat," or "fat-free." Avoid buying processed or prepackaged foods. Cooking Cook using low-fat methods, such as baking, broiling, grilling, or boiling. Cook with small amounts of healthy fats, such as olive oil, grapeseed oil, canola oil, avocado oil, or sunflower oil. What foods are recommended?  All fresh, frozen, or canned fruits and vegetables. Whole grains. Low-fat or nonfat (skim) milk and yogurt. Lean meat, skinless poultry, fish, eggs, and beans. Low-fat protein supplement powders or drinks. Spices and herbs. The items listed above may not be a complete list of foods and beverages you can eat and drink. Contact a dietitian for more information. What foods are not recommended? High-fat foods. These include baked goods, fast food, fatty  cuts of meat, ice cream, french toast, sweet rolls, pizza, cheese bread, foods covered with butter, creamy sauces, or cheese. Fried foods. These include french fries, tempura, battered fish, breaded chicken, fried breads, and sweets. Foods that cause bloating and gas. The items listed above may not be a complete list of foods that you should avoid. Contact a dietitian for more information. Summary A low-fat diet can be helpful if you have a gallbladder condition, or before and after gallbladder surgery. Limit your fat intake to less than 30% of your total daily calories. This is about 60 g of fat if you eat 1,800 calories each day. Eat small, frequent meals throughout the day instead of three large meals. This information is not intended to replace advice given  to you by your health care provider. Make sure you discuss any questions you have with your health care provider. Document Revised: 04/22/2021 Document Reviewed: 04/22/2021 [employer] Patient Education  2024 [employer].

## 2023-10-09 ENCOUNTER — Encounter (INDEPENDENT_AMBULATORY_CARE_PROVIDER_SITE_OTHER): Payer: Self-pay

## 2023-10-30 ENCOUNTER — Ambulatory Visit (INDEPENDENT_AMBULATORY_CARE_PROVIDER_SITE_OTHER)

## 2023-10-30 ENCOUNTER — Ambulatory Visit (INDEPENDENT_AMBULATORY_CARE_PROVIDER_SITE_OTHER): Admitting: Vascular Surgery

## 2023-10-30 ENCOUNTER — Encounter (INDEPENDENT_AMBULATORY_CARE_PROVIDER_SITE_OTHER): Payer: Self-pay | Admitting: Vascular Surgery

## 2023-10-30 VITALS — BP 130/66 | HR 61 | Resp 18 | Ht 64.0 in | Wt 120.0 lb

## 2023-10-30 DIAGNOSIS — N186 End stage renal disease: Secondary | ICD-10-CM | POA: Diagnosis not present

## 2023-10-30 DIAGNOSIS — I10 Essential (primary) hypertension: Secondary | ICD-10-CM | POA: Diagnosis not present

## 2023-10-30 DIAGNOSIS — E782 Mixed hyperlipidemia: Secondary | ICD-10-CM

## 2023-10-30 DIAGNOSIS — Z992 Dependence on renal dialysis: Secondary | ICD-10-CM | POA: Diagnosis not present

## 2023-10-30 NOTE — Assessment & Plan Note (Signed)
 lipid control important in reducing the progression of atherosclerotic disease. Continue statin therapy

## 2023-10-30 NOTE — Assessment & Plan Note (Signed)
 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 Well for his dialysis.  Continue to rotate the access sites to avoid aneurysmal degeneration.  Recheck with duplex in 6 months.

## 2023-10-30 NOTE — Assessment & Plan Note (Signed)
 blood pressure control important in reducing the progression of atherosclerotic disease. On appropriate oral medications.

## 2023-10-30 NOTE — Progress Notes (Signed)
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 created with Dragon medical transcription system.  Any errors from dictation are purely unintentional

## 2023-11-12 ENCOUNTER — Ambulatory Visit (INDEPENDENT_AMBULATORY_CARE_PROVIDER_SITE_OTHER): Admitting: Surgery

## 2023-11-12 ENCOUNTER — Encounter: Payer: Self-pay | Admitting: Surgery

## 2023-11-12 VITALS — BP 121/61 | HR 75 | Temp 98.3°F | Ht 64.0 in | Wt 92.0 lb

## 2023-11-12 DIAGNOSIS — K8020 Calculus of gallbladder without cholecystitis without obstruction: Secondary | ICD-10-CM | POA: Diagnosis not present
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
## 2023-11-12 NOTE — Patient Instructions (Signed)
Cholelithiasis  Cholelithiasis happens when gallstones form in the gallbladder. The gallbladder stores bile. Bile is a fluid that helps digest fats. Bile can harden and form into gallstones. If they cause a blockage, they can cause pain (gallbladder attack). What are the causes? This condition may be caused by: Too much bilirubin in the bile. This happens if you have sickle cell anemia. Too much of a fat-like substance (cholesterol) in your bile. Not enough bile salts in your bile. These salts help the body absorb and digest fats. The gallbladder not emptying fully or often enough. This is common in pregnant women. What increases the risk? The following factors may make you more likely to develop this condition: Being older than age 3. Eating a lot of fried foods, fat, and refined carbs (refined carbohydrates). Being male. Being pregnant many times. Using medicines with male hormones in them for a long time. Losing weight fast. Having gallstones in your family. Having health problems, such as diabetes, obesity, Crohn's disease, or liver disease. What are the signs or symptoms? Often, there may be gallstones but no symptoms. These gallstones are called silent gallstones. If a gallstone causes a blockage, you may get sudden pain. The pain: Can be in the upper right part of your belly (abdomen). Normally comes at night or after you eat. Can last an hour or more. Can spread to your right shoulder, back, or chest. Can feel like discomfort, burning, or fullness in the upper part of your belly (indigestion). If the blockage lasts more than a few hours, you can get an infection or swelling. You may: Vomit or feel like you may vomit (nauseous). Feel bloated. Have belly pain for 5 hours or more. Feel tender in your belly, often in the upper right part and under your ribs. Have a fever or chills. Have skin or the white parts of your eyes turn yellow (jaundice). Have dark pee (urine) or  pale poop (stool). How is this treated? Treatment for this condition depends on how bad you feel. If you have symptoms, you may need: Home care, if symptoms are not very bad. Do not eat for 12-24 hours. Drink only water and clear liquids. After 1 or 2 days, start to eat simple or clear foods. Try broth and crackers. You may need medicines for pain or stomach upset or both. If you have an infection, you will need antibiotics. A hospital stay, if you have very bad pain or a very bad infection. Surgery to remove your gallbladder. You may need this if: Gallstones keep coming back. You have very bad symptoms. Medicines to break up gallstones. Medicines may be used for 6-12 months. A procedure to find and take out gallstones or to break up gallstones. Follow these instructions at home: Medicines Take over-the-counter and prescription medicines only as told by your doctor. If you were prescribed antibiotics, take them as told by your doctor. Do not stop taking them even if you start to feel better. Ask your doctor if you should avoid driving or using machines while you are taking your medicine. Eating and drinking Drink enough fluid to keep your pee pale yellow. Drink water or clear fluids. This is important when you have pain. Eat healthy foods. Choose: Fewer fatty foods, such as fried foods. Fewer refined carbs. Avoid breads and grains that are highly processed, such as white bread and white rice. Choose whole grains, such as whole-wheat bread and brown rice. More fiber. Almonds, fresh fruit, and beans are healthy sources. General  instructions Keep a healthy weight. Keep all follow-up visits. You may need to see a specialist or a Careers adviser. Where to find more information [employer] of Diabetes and Digestive and Kidney Diseases: [email or web address] Contact a doctor if: You have sudden pain in the upper right part of your belly. Pain might spread to your right shoulder, back, or chest. Your  pain lasts more than 2 hours. You have been diagnosed with gallstones that have no symptoms and you get: Belly pain. Discomfort, burning, or fullness in the upper part of your abdomen. You keep feeling like you may vomit. You have dark pee or pale poop. Get help right away if: You have pain in your abdomen, that: Lasts more than 5 hours. Keeps getting worse. You have a fever or chills. You can't stop vomiting. Your skin or the white parts of your eyes turn yellow. This information is not intended to replace advice given to you by your health care provider. Make sure you discuss any questions you have with your health care provider. Document Revised: 02/20/2022 Document Reviewed: 02/20/2022 Elsevier Patient Education  2024 [employer].

## 2023-11-16 ENCOUNTER — Other Ambulatory Visit: Payer: Self-pay | Admitting: Diagnostic Radiology

## 2023-11-16 DIAGNOSIS — N139 Obstructive and reflux uropathy, unspecified: Secondary | ICD-10-CM

## 2023-11-22 ENCOUNTER — Ambulatory Visit: Admitting: Radiology

## 2023-11-27 ENCOUNTER — Encounter: Payer: Self-pay | Admitting: *Deleted

## 2023-11-27 ENCOUNTER — Emergency Department
Admission: EM | Admit: 2023-11-27 | Discharge: 2023-11-28 | Disposition: A | Discharge status: Home / Self Care | Attending: Emergency Medicine | Admitting: Emergency Medicine

## 2023-11-27 ENCOUNTER — Other Ambulatory Visit: Payer: Self-pay

## 2023-11-27 ENCOUNTER — Emergency Department

## 2023-11-27 DIAGNOSIS — Z936 Other artificial openings of urinary tract status: Secondary | ICD-10-CM | POA: Insufficient documentation

## 2023-11-27 DIAGNOSIS — R109 Unspecified abdominal pain: Secondary | ICD-10-CM | POA: Diagnosis present

## 2023-11-27 DIAGNOSIS — Z9889 Other specified postprocedural states: Secondary | ICD-10-CM

## 2023-11-27 LAB — BASIC METABOLIC PANEL WITH GFR
Anion gap: 13 (ref 5–15)
BUN: 58 mg/dL — ABNORMAL HIGH (ref 6–20)
CO2: 27 mmol/L (ref 22–32)
Calcium: 8.4 mg/dL — ABNORMAL LOW (ref 8.9–10.3)
Chloride: 97 mmol/L — ABNORMAL LOW (ref 98–111)
Creatinine, Ser: 9.35 mg/dL — ABNORMAL HIGH (ref 0.61–1.24)
GFR, Estimated: 6 mL/min — ABNORMAL LOW (ref >60)
Glucose, Bld: 111 mg/dL — ABNORMAL HIGH (ref 70–99)
Potassium: 4.1 mmol/L (ref 3.5–5.1)
Sodium: 137 mmol/L (ref 135–145)

## 2023-11-27 LAB — URINALYSIS, ROUTINE W REFLEX MICROSCOPIC
Bacteria, UA: NONE SEEN
Bilirubin Urine: NEGATIVE
Glucose, UA: NEGATIVE mg/dL
Ketones, ur: NEGATIVE mg/dL
Nitrite: NEGATIVE
Protein, ur: 100 mg/dL — AB
Specific Gravity, Urine: 1.016 (ref 1.005–1.030)
Squamous Epithelial / HPF: 0 /HPF (ref 0–5)
WBC, UA: >50 WBC/hpf (ref 0–5)
pH: 7 (ref 5.0–8.0)

## 2023-11-27 LAB — CBC
HCT: 33.3 % — ABNORMAL LOW (ref 39.0–52.0)
Hemoglobin: 10.4 g/dL — ABNORMAL LOW (ref 13.0–17.0)
MCH: 31.3 pg (ref 26.0–34.0)
MCHC: 31.2 g/dL (ref 30.0–36.0)
MCV: 100.3 fL — ABNORMAL HIGH (ref 80.0–100.0)
Platelets: 146 K/uL — ABNORMAL LOW (ref 150–400)
RBC: 3.32 MIL/uL — ABNORMAL LOW (ref 4.22–5.81)
RDW: 14.6 % (ref 11.5–15.5)
WBC: 5.2 K/uL (ref 4.0–10.5)
nRBC: 0 % (ref 0.0–0.2)

## 2023-11-27 MED ORDER — OXYCODONE-ACETAMINOPHEN 5-325 MG PO TABS
1.0000 | ORAL_TABLET | Freq: Once | ORAL | Status: AC
  Administered 2023-11-27: 1 via ORAL
  Filled 2023-11-27: qty 1

## 2023-11-27 NOTE — ED Triage Notes (Signed)
 First Nurse Note: Patient to ED via [facility] form [facility] for right flank pain. Started at 10am. Has nephrotomy tube on that side. Dialysis pt MWF- has not missed.   139/65 80 HR 97% RA

## 2023-11-27 NOTE — Discharge Instructions (Signed)
 No signs of infection in your kidneys or urine and no damage to your nephrostomy tube.  Please follow-up in 2 days as planned with the IR team for replacement.  You can take Tylenol  as needed for pain symptoms.

## 2023-11-27 NOTE — ED Triage Notes (Signed)
 Pt brought in via [facility]s from [facility].  Pt has nephrostomy tube on right flank.  Pt has increased pain since yesterday and states tube is being changed tomorrow at [facility].  Pt alert.

## 2023-11-27 NOTE — ED Provider Notes (Signed)
 [facility] Provider Note    Event Date/Time   First MD Initiated Contact with Patient 11/27/23 2146     (approximate)   History   Flank Pain   HPI Larry Morrison is a 53 y.o. male with history of ESRD on dialysis, chronic right-sided nephrostomy tube presenting today for flank pain.  Patient states earlier this morning he started noticing pain along his right flank where his nephrostomy tube is.  Denies it being dislodged at all and still having adequate output.  He denies any other abdominal pain complaints.  No fevers, chills, nausea, vomiting.  Plan to have his nephrostomy tube replaced in 2 days.  Chart review: Looked at recent visits including plan encounter for tube replacement in 2 days with IR     Physical Exam   Triage Vital Signs: ED Triage Vitals  Encounter Vitals Group     BP 11/27/23 1916 124/68     Girls Systolic BP Percentile --      Girls Diastolic BP Percentile --      Boys Systolic BP Percentile --      Boys Diastolic BP Percentile --      Pulse Rate 11/27/23 1916 76     Resp 11/27/23 1916 18     Temp 11/27/23 1916 98.3 F (36.8 C)     Temp Source 11/27/23 1916 Oral     SpO2 11/27/23 1916 100 %     Weight 11/27/23 1915 130 lb (59 kg)     Height 11/27/23 1915 5' 4 (1.626 m)     Head Circumference --      Peak Flow --      Pain Score 11/27/23 1915 6     Pain Loc --      Pain Education --      Exclude from Growth Chart --     Most recent vital signs: Vitals:   11/27/23 1916  BP: 124/68  Pulse: 76  Resp: 18  Temp: 98.3 F (36.8 C)  SpO2: 100%   I have reviewed the vital signs. General:  Awake, alert, no acute distress. Head:  Normocephalic, Atraumatic. EENT:  PERRL, EOMI, Oral mucosa pink and moist, Neck is supple. Cardiovascular: Regular rate, 2+ distal pulses. Respiratory:  Normal respiratory effort, symmetrical expansion, no distress.   Abdomen: Soft, nontender, nondistended.  No signs of erythema or infection or  drainage around the right nephrostomy tube.  Nontender to palpation around the area. Extremities:  Moving all four extremities through full ROM without pain.   Neuro:  Alert and oriented.  Interacting appropriately.   Skin:  Warm, dry, no rash.   Psych: Appropriate affect.    ED Results / Procedures / Treatments   Labs (all labs ordered are listed, but only abnormal results are displayed) Labs Reviewed  BASIC METABOLIC PANEL WITH GFR - Abnormal; Notable for the following components:      Result Value   Chloride 97 (*)    Glucose, Bld 111 (*)    BUN 58 (*)    Creatinine, Ser 9.35 (*)    Calcium  8.4 (*)    GFR, Estimated 6 (*)    All other components within normal limits  CBC - Abnormal; Notable for the following components:   RBC 3.32 (*)    Hemoglobin 10.4 (*)    HCT 33.3 (*)    MCV 100.3 (*)    Platelets 146 (*)    All other components within normal limits  URINALYSIS, ROUTINE W REFLEX  MICROSCOPIC - Abnormal; Notable for the following components:   Color, Urine YELLOW (*)    APPearance TURBID (*)    Hgb urine dipstick SMALL (*)    Protein, ur 100 (*)    Leukocytes,Ua SMALL (*)    All other components within normal limits     EKG    RADIOLOGY Independently interpreted CT abdomen/pelvis with normal-appearing right percutaneous nephrostomy tube with no stranding.  Questionable bladder wall thickening.   PROCEDURES:  Critical Care performed: No  Procedures   MEDICATIONS ORDERED IN ED: Medications  oxyCODONE -acetaminophen  (PERCOCET/ROXICET) 5-325 MG per tablet 1 tablet (1 tablet Oral Given 11/27/23 2244)     IMPRESSION / MDM / ASSESSMENT AND PLAN / ED COURSE  I reviewed the triage vital signs and the nursing notes.                              Differential diagnosis includes, but is not limited to, nephrostomy tube dislodgment, intrarenal infection, skin infection  Patient's presentation is most consistent with acute complicated illness / injury requiring  diagnostic workup.  Patient is a 53 year old male presenting today for pain around his right nephrostomy tube.  Vital signs stable.  Exam does not show obvious dislodgment of the tube and certainly no cellulitis or drainage around the tube itself.  It is still flowing well with normal output.  Laboratory workup with CBC unremarkable with no leukocytosis.  BMP consistent with his known ESRD for which he has dialysis tomorrow.  CT abdomen/pelvis was ordered for further evaluation.  This shows proper positioning of the nephrostomy tube without any other concerning findings.  They did notice some bladder wall thickening so we will get urinalysis to rule out infection in the bladder although this would not be related to his pain in his flank at this time given no signs of pyelonephritis.  UA is negative for infection.  Patient otherwise stable and feeling better after Percocet.  Will discharge with follow-up as planned with his IR team in 2 days for nephrostomy tube replacement.  Patient agreeable with plan and given strict return precautions.  Clinical Course as of 11/27/23 2338  Tue Nov 27, 2023  2323 Urinalysis, Routine w reflex microscopic -Urine, Clean Catch(!) No UTI [DW]    Clinical Course User Index [DW] Malvina Alm DASEN, MD     FINAL CLINICAL IMPRESSION(S) / ED DIAGNOSES   Final diagnoses:  Right flank pain  History of insertion of nephrostomy tube     Rx / DC Orders   ED Discharge Orders     None        Note:  This document was prepared using Dragon voice recognition software and may include unintentional dictation errors.   Malvina Alm DASEN, MD 11/27/23 [phone]

## 2023-11-28 ENCOUNTER — Other Ambulatory Visit (HOSPITAL_COMMUNITY): Payer: Self-pay | Admitting: Student

## 2023-11-28 NOTE — Progress Notes (Signed)
 Patient for IR RT Nephrostomy Tube Exchange on Thurs 11/29/23, I called and spoke with the patient's caretaker, Larry Morrison on the phone and gave pre-procedure instructions. Larry Morrison was made aware to be here at 9:30a, NPO after MN prior to procedure as well as driver post procedure/recovery/discharge. Larry Morrison stated understanding.  Called 11/20/23

## 2023-11-28 NOTE — ED Notes (Signed)
 Called [facility] spoke with [place]; Transport will arrive between now and 0451. Ticket # [phone]   [facility] has arrived to transport pt back to [facility]

## 2023-11-29 ENCOUNTER — Ambulatory Visit
Admission: RE | Admit: 2023-11-29 | Discharge: 2023-11-29 | Disposition: A | Discharge status: Home / Self Care | Source: Ambulatory Visit | Attending: Diagnostic Radiology | Admitting: Diagnostic Radiology

## 2023-12-03 ENCOUNTER — Ambulatory Visit (INDEPENDENT_AMBULATORY_CARE_PROVIDER_SITE_OTHER): Admitting: Podiatry

## 2023-12-03 DIAGNOSIS — Z992 Dependence on renal dialysis: Secondary | ICD-10-CM

## 2023-12-03 DIAGNOSIS — N186 End stage renal disease: Secondary | ICD-10-CM | POA: Diagnosis not present

## 2023-12-03 DIAGNOSIS — B351 Tinea unguium: Secondary | ICD-10-CM

## 2023-12-03 DIAGNOSIS — M79675 Pain in left toe(s): Secondary | ICD-10-CM

## 2023-12-03 DIAGNOSIS — M79674 Pain in right toe(s): Secondary | ICD-10-CM

## 2023-12-03 DIAGNOSIS — D689 Coagulation defect, unspecified: Secondary | ICD-10-CM | POA: Diagnosis not present

## 2023-12-03 NOTE — Patient Instructions (Addendum)
 ONCE WEEKLY FOOT SOAK INSTRUCTIONS FOR FOOT HYGIENE   USE DIAL ANTIBACTERIAL SOAP: SOAK FEET IN LUKEWARM SOAPY WATER FOR 10 MINUTES. HAVE A FAMILY MEMBER OR CAREGIVER CHECK THE WATER TEMPERATURE FOR YOU BEFORE SUBMERGING YOUR FEET IN THE WATER.  2.  DRY FEET WELL TAKING CARE TO DRY WELL BETWEEN TOES AND UNDER TOES.  3.  APPLY MOISTURIZING LOTION TO FEET AVOIDING APPLICATION BETWEEN TOES.

## 2023-12-09 ENCOUNTER — Encounter: Payer: Self-pay | Admitting: Podiatry

## 2023-12-09 NOTE — Progress Notes (Signed)
  Subjective:  Patient ID: Larry Morrison, male    DOB: [birth year],  MRN: [id number]  Larry Morrison presents to clinic today for at risk foot care. Patient has h/o ESRD on hemodialysis and painful elongated mycotic toenails 1-5 bilaterally which are tender when wearing enclosed shoe gear. Pain is relieved with periodic professional debridement.  Chief Complaint  Patient presents with   Nail Problem    Thick painful toenails, 3 month follow up   New problem(s): None.   PCP is [facility], Public house manager.  No Known Allergies  Review of Systems: Negative except as noted in the HPI.  Objective: No changes noted in today's physical examination. There were no vitals filed for this visit. Larry Morrison is a pleasant 53 y.o. male in NAD. AAO x 3.  Vascular Examination: Capillary refill time immediate b/l. Vascular status intact b/l with palpable pedal pulses. Pedal hair present b/l. No pain with calf compression b/l. Skin temperature gradient WNL b/l. No cyanosis or clubbing b/l. No ischemia or gangrene noted b/l.   Neurological Examination: Sensation grossly intact b/l with 10 gram monofilament. Vibratory sensation intact b/l.   Dermatological Examination: Pedal skin with normal turgor, texture and tone b/l.  No open wounds. No interdigital macerations.   Toenails 1-5 b/l thick, discolored, elongated with subungual debris and pain on dorsal palpation.   Pedal skin noted to exhibit signs of poor pedal hygiene with noted foot odor b/l and interdigital debris. No open wounds noted.  Musculoskeletal Examination: Muscle strength 5/5 to all lower extremity muscle groups bilaterally. No pain, crepitus or joint limitation noted with ROM bilateral LE. Hammertoe deformity noted 2-5 b/l. Pes planus deformity noted bilateral LE.  Radiographs: None  Assessment/Plan: 1. Pain due to onychomycosis of toenails of both feet   2. ESRD on hemodialysis (HCC)   3. Clotting disorder (HCC)     -Consent given for  treatment as described below: -Examined patient. -Patient to continue soft, supportive shoe gear daily. -Mycotic toenails 1-5 bilaterally were debrided in length and girth with sterile nail nippers and dremel without incident. -Written instructions dispensed for daily foot soaks  for poor pedal hygiene. -Patient/POA to call should there be question/concern in the interim.   Return in about 3 months (around 03/04/2024).  Larry Morrison, DPM      [facility] LOCATION: [address], [place] [address]                   Office [phone]   [facility] LOCATION: [address] [place], [place] [address] Office [phone]

## 2023-12-12 ENCOUNTER — Other Ambulatory Visit: Payer: Self-pay | Admitting: Radiology

## 2023-12-12 NOTE — Progress Notes (Signed)
 Patient for IR RT Neph Tube Exchange on Thurs 12/13/23, I called and spoke with the patient's Caretaker, Brandi on the phone and gave pre-procedure instructions. Daphne was made aware to have the patient here at 8:30a, NPO after MN prior to procedure as well as driver post procedure/recovery/discharge. Daphne stated understanding.  Called 12/12/23

## 2023-12-13 ENCOUNTER — Other Ambulatory Visit: Payer: Self-pay

## 2023-12-13 ENCOUNTER — Encounter: Payer: Self-pay | Admitting: Radiology

## 2023-12-13 ENCOUNTER — Ambulatory Visit
Admission: RE | Admit: 2023-12-13 | Discharge: 2023-12-13 | Disposition: A | Discharge status: Home / Self Care | Source: Ambulatory Visit | Attending: Diagnostic Radiology | Admitting: Diagnostic Radiology

## 2023-12-13 ENCOUNTER — Other Ambulatory Visit: Payer: Self-pay | Admitting: Interventional Radiology

## 2023-12-13 DIAGNOSIS — Z436 Encounter for attention to other artificial openings of urinary tract: Secondary | ICD-10-CM | POA: Insufficient documentation

## 2023-12-13 DIAGNOSIS — Z992 Dependence on renal dialysis: Secondary | ICD-10-CM | POA: Insufficient documentation

## 2023-12-13 DIAGNOSIS — I132 Hypertensive heart and chronic kidney disease with heart failure and with stage 5 chronic kidney disease, or end stage renal disease: Secondary | ICD-10-CM | POA: Diagnosis not present

## 2023-12-13 DIAGNOSIS — D631 Anemia in chronic kidney disease: Secondary | ICD-10-CM | POA: Insufficient documentation

## 2023-12-13 DIAGNOSIS — I5022 Chronic systolic (congestive) heart failure: Secondary | ICD-10-CM | POA: Diagnosis not present

## 2023-12-13 DIAGNOSIS — N200 Calculus of kidney: Secondary | ICD-10-CM | POA: Insufficient documentation

## 2023-12-13 DIAGNOSIS — F17210 Nicotine dependence, cigarettes, uncomplicated: Secondary | ICD-10-CM | POA: Insufficient documentation

## 2023-12-13 DIAGNOSIS — N139 Obstructive and reflux uropathy, unspecified: Secondary | ICD-10-CM | POA: Diagnosis not present

## 2023-12-13 DIAGNOSIS — N186 End stage renal disease: Secondary | ICD-10-CM | POA: Insufficient documentation

## 2023-12-13 HISTORY — PX: IR NEPHROSTOMY EXCHANGE RIGHT: IMG6070

## 2023-12-13 MED ORDER — IOHEXOL 300 MG/ML  SOLN
10.0000 mL | Freq: Once | INTRAMUSCULAR | Status: AC | PRN
  Administered 2023-12-13: 10 mL

## 2023-12-13 MED ORDER — FENTANYL CITRATE (PF) 100 MCG/2ML IJ SOLN
INTRAMUSCULAR | Status: AC
Start: 2023-12-13 — End: 2023-12-13
  Filled 2023-12-13: qty 2

## 2023-12-13 MED ORDER — MIDAZOLAM HCL 2 MG/2ML IJ SOLN
INTRAMUSCULAR | Status: AC
  Filled 2023-12-13: qty 2

## 2023-12-13 MED ORDER — SODIUM CHLORIDE 0.9 % IV SOLN: INTRAVENOUS | Status: DC

## 2023-12-13 MED ORDER — MIDAZOLAM HCL 2 MG/2ML IJ SOLN
INTRAMUSCULAR | Status: AC | PRN
  Administered 2023-12-13: 1 mg via INTRAVENOUS

## 2023-12-13 MED ORDER — LIDOCAINE HCL 1 % IJ SOLN
2.0000 mL | Freq: Once | INTRAMUSCULAR | Status: AC
  Administered 2023-12-13: 2 mL via INTRADERMAL

## 2023-12-13 MED ORDER — MIDAZOLAM HCL 5 MG/5ML IJ SOLN
INTRAMUSCULAR | Status: AC | PRN
  Administered 2023-12-13: 1 mg via INTRAVENOUS

## 2023-12-13 MED ORDER — FENTANYL CITRATE (PF) 100 MCG/2ML IJ SOLN
INTRAMUSCULAR | Status: AC | PRN
  Administered 2023-12-13 (×2): 50 ug via INTRAVENOUS

## 2023-12-13 MED ORDER — LIDOCAINE HCL 1 % IJ SOLN
INTRAMUSCULAR | Status: AC
  Filled 2023-12-13: qty 20
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
## 2023-12-13 NOTE — Progress Notes (Signed)
 Mr.Xia stays at [facility]. [name] is the administrator there and her phone number is [phone]. I called to get his medication list updated and no one answered. Transportation service used is [employer] ([employer])- [phone].

## 2024-01-17 ENCOUNTER — Encounter (INDEPENDENT_AMBULATORY_CARE_PROVIDER_SITE_OTHER): Payer: Medicaid Other

## 2024-01-17 ENCOUNTER — Ambulatory Visit (INDEPENDENT_AMBULATORY_CARE_PROVIDER_SITE_OTHER): Payer: Medicaid Other | Admitting: Vascular Surgery

## 2024-03-03 ENCOUNTER — Other Ambulatory Visit: Payer: Self-pay

## 2024-03-03 ENCOUNTER — Inpatient Hospital Stay
Admission: EM | Admit: 2024-03-03 | Discharge: 2024-03-08 | DRG: 314 | Disposition: A | Discharge status: Skilled Nursing Facility | Attending: Student | Admitting: Student

## 2024-03-03 ENCOUNTER — Encounter
Admission: EM | Disposition: A | Discharge status: Skilled Nursing Facility | Payer: Self-pay | Source: Home / Self Care | Attending: Student

## 2024-03-03 DIAGNOSIS — I2510 Atherosclerotic heart disease of native coronary artery without angina pectoris: Secondary | ICD-10-CM | POA: Diagnosis present

## 2024-03-03 DIAGNOSIS — I272 Pulmonary hypertension, unspecified: Secondary | ICD-10-CM | POA: Diagnosis present

## 2024-03-03 DIAGNOSIS — I10 Essential (primary) hypertension: Secondary | ICD-10-CM | POA: Diagnosis present

## 2024-03-03 DIAGNOSIS — Z8659 Personal history of other mental and behavioral disorders: Secondary | ICD-10-CM | POA: Diagnosis not present

## 2024-03-03 DIAGNOSIS — Z7982 Long term (current) use of aspirin: Secondary | ICD-10-CM

## 2024-03-03 DIAGNOSIS — T82898A Other specified complication of vascular prosthetic devices, implants and grafts, initial encounter: Secondary | ICD-10-CM | POA: Diagnosis not present

## 2024-03-03 DIAGNOSIS — D631 Anemia in chronic kidney disease: Secondary | ICD-10-CM | POA: Diagnosis present

## 2024-03-03 DIAGNOSIS — Z8616 Personal history of COVID-19: Secondary | ICD-10-CM

## 2024-03-03 DIAGNOSIS — E46 Unspecified protein-calorie malnutrition: Secondary | ICD-10-CM | POA: Diagnosis present

## 2024-03-03 DIAGNOSIS — R456 Violent behavior: Secondary | ICD-10-CM | POA: Diagnosis not present

## 2024-03-03 DIAGNOSIS — Z515 Encounter for palliative care: Secondary | ICD-10-CM | POA: Diagnosis not present

## 2024-03-03 DIAGNOSIS — F17210 Nicotine dependence, cigarettes, uncomplicated: Secondary | ICD-10-CM | POA: Diagnosis present

## 2024-03-03 DIAGNOSIS — N186 End stage renal disease: Secondary | ICD-10-CM | POA: Diagnosis present

## 2024-03-03 DIAGNOSIS — K219 Gastro-esophageal reflux disease without esophagitis: Secondary | ICD-10-CM | POA: Diagnosis present

## 2024-03-03 DIAGNOSIS — E43 Unspecified severe protein-calorie malnutrition: Secondary | ICD-10-CM | POA: Diagnosis present

## 2024-03-03 DIAGNOSIS — T8241XA Breakdown (mechanical) of vascular dialysis catheter, initial encounter: Secondary | ICD-10-CM | POA: Diagnosis present

## 2024-03-03 DIAGNOSIS — Z992 Dependence on renal dialysis: Secondary | ICD-10-CM

## 2024-03-03 DIAGNOSIS — I120 Hypertensive chronic kidney disease with stage 5 chronic kidney disease or end stage renal disease: Secondary | ICD-10-CM

## 2024-03-03 DIAGNOSIS — R54 Age-related physical debility: Secondary | ICD-10-CM | POA: Diagnosis present

## 2024-03-03 DIAGNOSIS — E039 Hypothyroidism, unspecified: Secondary | ICD-10-CM | POA: Diagnosis present

## 2024-03-03 DIAGNOSIS — E1122 Type 2 diabetes mellitus with diabetic chronic kidney disease: Secondary | ICD-10-CM | POA: Diagnosis present

## 2024-03-03 DIAGNOSIS — Z91198 Patient's noncompliance with other medical treatment and regimen for other reason: Secondary | ICD-10-CM | POA: Diagnosis not present

## 2024-03-03 DIAGNOSIS — I429 Cardiomyopathy, unspecified: Secondary | ICD-10-CM | POA: Diagnosis present

## 2024-03-03 DIAGNOSIS — Z8249 Family history of ischemic heart disease and other diseases of the circulatory system: Secondary | ICD-10-CM

## 2024-03-03 DIAGNOSIS — I5022 Chronic systolic (congestive) heart failure: Secondary | ICD-10-CM | POA: Diagnosis present

## 2024-03-03 DIAGNOSIS — Z87442 Personal history of urinary calculi: Secondary | ICD-10-CM

## 2024-03-03 DIAGNOSIS — Z905 Acquired absence of kidney: Secondary | ICD-10-CM

## 2024-03-03 DIAGNOSIS — T82590A Other mechanical complication of surgically created arteriovenous fistula, initial encounter: Secondary | ICD-10-CM | POA: Diagnosis present

## 2024-03-03 DIAGNOSIS — N2581 Secondary hyperparathyroidism of renal origin: Secondary | ICD-10-CM | POA: Diagnosis present

## 2024-03-03 DIAGNOSIS — Z794 Long term (current) use of insulin: Secondary | ICD-10-CM | POA: Diagnosis not present

## 2024-03-03 DIAGNOSIS — Z8673 Personal history of transient ischemic attack (TIA), and cerebral infarction without residual deficits: Secondary | ICD-10-CM

## 2024-03-03 DIAGNOSIS — F17200 Nicotine dependence, unspecified, uncomplicated: Secondary | ICD-10-CM | POA: Diagnosis present

## 2024-03-03 DIAGNOSIS — R64 Cachexia: Secondary | ICD-10-CM | POA: Diagnosis present

## 2024-03-03 DIAGNOSIS — E875 Hyperkalemia: Secondary | ICD-10-CM | POA: Diagnosis present

## 2024-03-03 DIAGNOSIS — T82868A Thrombosis of vascular prosthetic devices, implants and grafts, initial encounter: Secondary | ICD-10-CM | POA: Diagnosis not present

## 2024-03-03 DIAGNOSIS — Y712 Prosthetic and other implants, materials and accessory cardiovascular devices associated with adverse incidents: Secondary | ICD-10-CM | POA: Diagnosis present

## 2024-03-03 DIAGNOSIS — N319 Neuromuscular dysfunction of bladder, unspecified: Secondary | ICD-10-CM | POA: Diagnosis present

## 2024-03-03 DIAGNOSIS — E872 Acidosis, unspecified: Secondary | ICD-10-CM | POA: Diagnosis not present

## 2024-03-03 DIAGNOSIS — T82590D Other mechanical complication of surgically created arteriovenous fistula, subsequent encounter: Secondary | ICD-10-CM | POA: Diagnosis not present

## 2024-03-03 DIAGNOSIS — Z79899 Other long term (current) drug therapy: Secondary | ICD-10-CM

## 2024-03-03 DIAGNOSIS — Z91158 Patient's noncompliance with renal dialysis for other reason: Secondary | ICD-10-CM

## 2024-03-03 DIAGNOSIS — E785 Hyperlipidemia, unspecified: Secondary | ICD-10-CM | POA: Diagnosis present

## 2024-03-03 DIAGNOSIS — T82510A Breakdown (mechanical) of surgically created arteriovenous fistula, initial encounter: Principal | ICD-10-CM | POA: Diagnosis present

## 2024-03-03 DIAGNOSIS — Z6822 Body mass index (BMI) 22.0-22.9, adult: Secondary | ICD-10-CM

## 2024-03-03 DIAGNOSIS — I132 Hypertensive heart and chronic kidney disease with heart failure and with stage 5 chronic kidney disease, or end stage renal disease: Secondary | ICD-10-CM | POA: Diagnosis present

## 2024-03-03 DIAGNOSIS — Z87820 Personal history of traumatic brain injury: Secondary | ICD-10-CM

## 2024-03-03 DIAGNOSIS — Z91148 Patient's other noncompliance with medication regimen for other reason: Secondary | ICD-10-CM

## 2024-03-03 DIAGNOSIS — Z8674 Personal history of sudden cardiac arrest: Secondary | ICD-10-CM

## 2024-03-03 DIAGNOSIS — Z79890 Hormone replacement therapy (postmenopausal): Secondary | ICD-10-CM

## 2024-03-03 DIAGNOSIS — Z993 Dependence on wheelchair: Secondary | ICD-10-CM

## 2024-03-03 LAB — BASIC METABOLIC PANEL WITH GFR
Anion gap: 15 (ref 5–15)
BUN: 81 mg/dL — ABNORMAL HIGH (ref 6–20)
CO2: 24 mmol/L (ref 22–32)
Calcium: 8.4 mg/dL — ABNORMAL LOW (ref 8.9–10.3)
Chloride: 99 mmol/L (ref 98–111)
Creatinine, Ser: 11.76 mg/dL — ABNORMAL HIGH (ref 0.61–1.24)
GFR, Estimated: 5 mL/min — ABNORMAL LOW (ref >60)
Glucose, Bld: 78 mg/dL (ref 70–99)
Potassium: 6.2 mmol/L — ABNORMAL HIGH (ref 3.5–5.1)
Sodium: 138 mmol/L (ref 135–145)

## 2024-03-03 LAB — CBC
HCT: 36.4 % — ABNORMAL LOW (ref 39.0–52.0)
Hemoglobin: 11.9 g/dL — ABNORMAL LOW (ref 13.0–17.0)
MCH: 31.6 pg (ref 26.0–34.0)
MCHC: 32.7 g/dL (ref 30.0–36.0)
MCV: 96.8 fL (ref 80.0–100.0)
Platelets: 154 K/uL (ref 150–400)
RBC: 3.76 MIL/uL — ABNORMAL LOW (ref 4.22–5.81)
RDW: 13.3 % (ref 11.5–15.5)
WBC: 5.4 K/uL (ref 4.0–10.5)
nRBC: 0 % (ref 0.0–0.2)

## 2024-03-03 LAB — RENAL FUNCTION PANEL
Albumin: 3.1 g/dL — ABNORMAL LOW (ref 3.5–5.0)
Anion gap: 15 (ref 5–15)
BUN: 85 mg/dL — ABNORMAL HIGH (ref 6–20)
CO2: 26 mmol/L (ref 22–32)
Calcium: 10.2 mg/dL (ref 8.9–10.3)
Chloride: 96 mmol/L — ABNORMAL LOW (ref 98–111)
Creatinine, Ser: 11.41 mg/dL — ABNORMAL HIGH (ref 0.61–1.24)
GFR, Estimated: 5 mL/min — ABNORMAL LOW (ref >60)
Glucose, Bld: 62 mg/dL — ABNORMAL LOW (ref 70–99)
Phosphorus: 4.4 mg/dL (ref 2.5–4.6)
Potassium: 5.3 mmol/L — ABNORMAL HIGH (ref 3.5–5.1)
Sodium: 137 mmol/L (ref 135–145)

## 2024-03-03 LAB — POTASSIUM
Potassium: 4.7 mmol/L (ref 3.5–5.1)
Potassium: 5.4 mmol/L — ABNORMAL HIGH (ref 3.5–5.1)

## 2024-03-03 LAB — MAGNESIUM: Magnesium: 2.2 mg/dL (ref 1.7–2.4)

## 2024-03-03 LAB — PHOSPHORUS
Phosphorus: 4.9 mg/dL — ABNORMAL HIGH (ref 2.5–4.6)
Phosphorus: 4.4 mg/dL (ref 2.5–4.6)

## 2024-03-03 LAB — CBG MONITORING, ED
Glucose-Capillary: 96 mg/dL (ref 70–99)
Glucose-Capillary: 111 mg/dL — ABNORMAL HIGH (ref 70–99)

## 2024-03-03 SURGERY — INVASIVE LAB ABORTED CASE

## 2024-03-03 MED ORDER — SODIUM BICARBONATE 8.4 % IV SOLN
50.0000 meq | Freq: Once | INTRAVENOUS | Status: AC
  Administered 2024-03-03: 50 meq via INTRAVENOUS
  Filled 2024-03-03: qty 50

## 2024-03-03 MED ORDER — ALBUTEROL SULFATE (2.5 MG/3ML) 0.083% IN NEBU
10.0000 mg | INHALATION_SOLUTION | Freq: Once | RESPIRATORY_TRACT | Status: AC
  Administered 2024-03-03: 10 mg via RESPIRATORY_TRACT
  Filled 2024-03-03: qty 12

## 2024-03-03 MED ORDER — PATIROMER SORBITEX CALCIUM 8.4 G PO PACK
16.8000 g | PACK | Freq: Every day | ORAL | Status: DC
  Administered 2024-03-03 – 2024-03-04 (×2): 16.8 g via ORAL
  Filled 2024-03-03 (×3): qty 2

## 2024-03-03 MED ORDER — PATIROMER SORBITEX CALCIUM 8.4 G PO PACK
25.2000 g | PACK | Freq: Every day | ORAL | Status: DC
  Filled 2024-03-03: qty 3

## 2024-03-03 MED ORDER — CHLORHEXIDINE GLUCONATE CLOTH 2 % EX PADS
6.0000 | MEDICATED_PAD | Freq: Every day | CUTANEOUS | Status: DC
  Administered 2024-03-06 – 2024-03-08 (×2): 6 via TOPICAL
  Filled 2024-03-03 (×2): qty 6

## 2024-03-03 MED ORDER — SODIUM ZIRCONIUM CYCLOSILICATE 10 G PO PACK
10.0000 g | PACK | Freq: Once | ORAL | Status: AC
  Administered 2024-03-03: 10 g via ORAL
  Filled 2024-03-03: qty 1

## 2024-03-03 MED ORDER — HEPARIN SODIUM (PORCINE) 5000 UNIT/ML IJ SOLN
5000.0000 [IU] | Freq: Three times a day (TID) | INTRAMUSCULAR | Status: DC
  Filled 2024-03-03 (×4): qty 1

## 2024-03-03 MED ORDER — FENTANYL CITRATE (PF) 50 MCG/ML IJ SOSY
12.5000 ug | PREFILLED_SYRINGE | Freq: Four times a day (QID) | INTRAMUSCULAR | Status: AC | PRN
Start: 2024-03-03 — End: ?
  Administered 2024-03-04 – 2024-03-07 (×8): 12.5 ug via INTRAVENOUS
  Filled 2024-03-03 (×10): qty 1

## 2024-03-03 MED ORDER — ALBUTEROL SULFATE (2.5 MG/3ML) 0.083% IN NEBU
2.5000 mg | INHALATION_SOLUTION | Freq: Four times a day (QID) | RESPIRATORY_TRACT | Status: DC
  Administered 2024-03-04 (×5): 2.5 mg via RESPIRATORY_TRACT
  Filled 2024-03-03 (×6): qty 3

## 2024-03-03 MED ORDER — DEXTROSE 50 % IV SOLN
1.0000 | Freq: Once | INTRAVENOUS | Status: AC
  Administered 2024-03-03: 50 mL via INTRAVENOUS
  Filled 2024-03-03: qty 50

## 2024-03-03 MED ORDER — INSULIN ASPART 100 UNIT/ML IV SOLN
5.0000 [IU] | Freq: Once | INTRAVENOUS | Status: AC
  Administered 2024-03-03: 5 [IU] via INTRAVENOUS
  Filled 2024-03-03: qty 0.05

## 2024-03-03 MED ORDER — CALCIUM GLUCONATE-NACL 1-0.675 GM/50ML-% IV SOLN
1.0000 g | Freq: Once | INTRAVENOUS | Status: AC
  Administered 2024-03-03: 1000 mg via INTRAVENOUS
  Filled 2024-03-03: qty 50

## 2024-03-03 SURGICAL SUPPLY — 2 items
COVER PROBE ULTRASOUND 5X96 (MISCELLANEOUS) IMPLANT
KIT DIALYSIS CATH TRI 30X13 (CATHETERS) IMPLANT

## 2024-03-03 NOTE — Progress Notes (Signed)
 [facility]  ROUNDING NOTE   Subjective:   Larry Morrison is a 53 year old African-American male with past medical history including pulmonary hypertension, combined CHF, CVA, hypertension, neurogenic bladder with right nephrostomy tube, and end-stage renal disease on hemodialysis patient presents to the emergency department for evaluation of dialysis access and has been admitted for Hyperkalemia [E87.5] End stage renal failure on dialysis (HCC) [N18.6, Z99.2] Problem with dialysis access, initial encounter [T82.898A]   Patient is known to our practice and receives outpatient dialysis treatments at [facility] on a MWF schedule, supervised by Dr. Douglas.  Last treatment received on Friday.  Patient seen resting on stretcher in emergency department.  States he did receive a full dialysis treatment on Friday.  Arrived to dialysis today, outpatient dialysis team concerned for lack of a bruit and thrill in access.  Patient encouraged to come to emergency department for further evaluation.  Patient currently frustrated, vascular team seeking placement of HD temp cath however patient requested to be knocked out before placement due to pain and discomfort.  Patient is adamant that is the only way the line will get placed.  States he is too old to endure that type of pain and discomfort associated with that line.  Patient also states he is perfectly fine with dying and going to hell.  Labs on ED arrival concerning for potassium 6.2, BUN 81, creatinine 11.76 with GFR 5, and hemoglobin 11.9.  We have been consulted to manage dialysis needs during this admission.   Objective:  Vital signs in last 24 hours:  Temp:  [97.8 F (36.6 C)] 97.8 F (36.6 C) (10/13 1150) Pulse Rate:  [55-65] 65 (10/13 1400) Resp:  [19-22] 22 (10/13 1400) BP: (162-180)/(94-95) 180/95 (10/13 1400) SpO2:  [97 %-98 %] 98 % (10/13 1400) Weight:  [59 kg] 59 kg (10/13 1149)  Weight change:  Filed Weights    03/03/24 1149  Weight: 59 kg    Intake/Output: No intake/output data recorded.   Intake/Output this shift:  No intake/output data recorded.  Physical Exam: General: NAD  Head: Normocephalic, atraumatic. Moist oral mucosal membranes  Eyes: Anicteric  Lungs:  Clear to auscultation, normal effort  Heart: Regular rate and rhythm  Abdomen:  Soft, nontender  Extremities: No peripheral edema.  Neurologic: Awake, alert, conversant  Skin: Warm,dry, no rash  Access: Left upper aVF    Basic Metabolic Panel: Recent Labs  Lab 03/03/24 1233 03/03/24 1426  NA 138  --   K 6.2* 4.7  CL 99  --   CO2 24  --   GLUCOSE 78  --   BUN 81*  --   CREATININE 11.76*  --   CALCIUM  8.4*  --   MG 2.2  --   PHOS 4.9*  --     Liver Function Tests: No results for input(s): AST, ALT, ALKPHOS, BILITOT, PROT, ALBUMIN  in the last 168 hours. No results for input(s): LIPASE, AMYLASE in the last 168 hours. No results for input(s): AMMONIA in the last 168 hours.  CBC: Recent Labs  Lab 03/03/24 1233  WBC 5.4  HGB 11.9*  HCT 36.4*  MCV 96.8  PLT 154    Cardiac Enzymes: No results for input(s): CKTOTAL, CKMB, CKMBINDEX, TROPONINI in the last 168 hours.  BNP: Invalid input(s): POCBNP  CBG: Recent Labs  Lab 03/03/24 1518  GLUCAP 96    Microbiology: Results for orders placed or performed during the hospital encounter of 09/19/23  Urine Culture     Status: None  Collection Time: 09/19/23 10:51 AM   Specimen: Urine, Clean Catch  Result Value Ref Range Status   Specimen Description   Final    URINE, CLEAN CATCH Performed at [facility], [address] [place], [place] [address]    Special Requests   Final    NONE Performed at [facility], [address], [place], [place] [address]    Culture   Final    NO GROWTH Performed at [facility], [address], [place], [place] [address]    Report Status 09/21/2023 FINAL  Final     Coagulation Studies: No results for input(s): LABPROT, INR in the last 72 hours.  Urinalysis: No results for input(s): COLORURINE, LABSPEC, PHURINE, GLUCOSEU, HGBUR, BILIRUBINUR, KETONESUR, PROTEINUR, UROBILINOGEN, NITRITE, LEUKOCYTESUR in the last 72 hours.  Invalid input(s): APPERANCEUR    Imaging: No results found.   Medications:     albuterol   2.5 mg Nebulization Q6H   heparin   5,000 Units Subcutaneous Q8H   patiromer   16.8 g Oral Daily     Assessment/ Plan:  [name] is a 53 y.o.  male  with past medical history including pulmonary hypertension, combined CHF, CVA, hypertension, neurogenic bladder with right nephrostomy tube, and end-stage renal disease on hemodialysis   [facility]/MWF/Rt Permcath   End-stage renal disease with hyperkalemia on hemodialysis.  Last treatment completed on Friday.  Unable to complete outpatient treatment due to access malfunction.  Vascular surgery has been consulted for placement of HD temp cath due to elevated potassium, 6.2.  Patient refusing.  Is agreeable to medical management.  Which includes Lokelma , Veltassa , and IV medications.  Will utilize these medications to manage potassium levels, 4.7 at last check.  Will plan dialysis after access arm evaluated and treated.  2.  Malfunctioning dialysis access, left upper arm aVF without bruit or thrill.  Vascular surgery consulted and will evaluate and treat access once potassium control.  3. Anemia of chronic kidney disease Lab Results  Component Value Date   HGB 11.9 (L) 03/03/2024   Hgb within optimal range   4. Hypertension with chronic kidney disease. Home regimen includes carvedilol  and irbesartan . Both held at this time.    LOS: 0 Larry Morrison 10/13/20253:36 PM

## 2024-03-03 NOTE — ED Notes (Signed)
 This tech & haley EDT cleaned pt, changed all linen, and placed a brief on the pt. Pt is bowel incontinent.
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
## 2024-03-03 NOTE — ED Notes (Signed)
 First Nurse Note: Pt to ED via dialysis for his fistula port not working. Pt is not able to stand on his own, in wheelchair at baseline. Pt gets dialysis M,W,F, pt got full treatment on Friday.

## 2024-03-03 NOTE — ED Notes (Signed)
 PT reports dialysis fistula not working today. Denies any other complaints at this time.

## 2024-03-03 NOTE — ED Notes (Signed)
 Pt refusing cath at this time. Pt cursing at staff. Pt made aware that cursing at staff is not appropriate.
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
## 2024-03-03 NOTE — ED Triage Notes (Signed)
 Pt comes via EMS from dialysis with c/o cath problem. Pt does MWF dialysis and unable to have treatment today.

## 2024-03-03 NOTE — ED Provider Notes (Signed)
 [facility] Provider Note    Event Date/Time   First MD Initiated Contact with Patient 03/03/24 1220     (approximate)   History   Vascular Access Problem   HPI  Larry Morrison is a 53 y.o. male past medical history significant for ESRD on HD MWF with left AV fistula, who presents to the emergency department with concern for dialysis failure.  States that he went to dialysis today but they were unable to do dialysis due to difficulty accessing his last AV fistula.  States that he used to have a catheter but does not have anymore.  Denies any other complaints.  Last had dialysis on Friday.     Physical Exam   Triage Vital Signs: ED Triage Vitals  Encounter Vitals Group     BP 03/03/24 1150 (!) 162/94     Girls Systolic BP Percentile --      Girls Diastolic BP Percentile --      Boys Systolic BP Percentile --      Boys Diastolic BP Percentile --      Pulse Rate 03/03/24 1150 (!) 55     Resp 03/03/24 1150 19     Temp 03/03/24 1150 97.8 F (36.6 C)     Temp src --      SpO2 03/03/24 1150 97 %     Weight 03/03/24 1149 130 lb (59 kg)     Height 03/03/24 1149 5' 4 (1.626 m)     Head Circumference --      Peak Flow --      Pain Score 03/03/24 1149 0     Pain Loc --      Pain Education --      Exclude from Growth Chart --     Most recent vital signs: Vitals:   03/03/24 1150  BP: (!) 162/94  Pulse: (!) 55  Resp: 19  Temp: 97.8 F (36.6 C)  SpO2: 97%    Physical Exam Constitutional:      Appearance: He is well-developed.  HENT:     Head: Atraumatic.  Eyes:     Conjunctiva/sclera: Conjunctivae normal.  Cardiovascular:     Rate and Rhythm: Regular rhythm.  Pulmonary:     Effort: No respiratory distress.  Musculoskeletal:     Cervical back: Normal range of motion.  Skin:    General: Skin is warm.     Comments: Left AV fistula with no palpable thrill  Neurological:     Mental Status: He is alert. Mental status is at baseline.      IMPRESSION / MDM / ASSESSMENT AND PLAN / ED COURSE  I reviewed the triage vital signs and the nursing notes.  Differential diagnosis including electrolyte abnormality, failed AV fistula  EKG  I, Clotilda Punter, the attending physician, personally viewed and interpreted this ECG.  EKG with normal sinus rhythm.  QTc 498.  Peaked T waves to V3 through V5.  No significant ST elevation or depression.  No findings of acute ischemia.  Findings concerning for hyperkalemia.  No tachycardic or bradycardic dysrhythmias while on cardiac telemetry.   LABS (all labs ordered are listed, but only abnormal results are displayed) Labs interpreted as -    Labs Reviewed  CBC - Abnormal; Notable for the following components:      Result Value   RBC 3.76 (*)    Hemoglobin 11.9 (*)    HCT 36.4 (*)    All other components within normal limits  BASIC  METABOLIC PANEL WITH GFR - Abnormal; Notable for the following components:   Potassium 6.2 (*)    BUN 81 (*)    Creatinine, Ser 11.76 (*)    Calcium  8.4 (*)    GFR, Estimated 5 (*)    All other components within normal limits  MAGNESIUM   PHOSPHORUS     MDM  Found to have hyperkalemia with potassium elevated up to 6.2.  Creatinine appears to be at his baseline.  No signs of an infectious process.  Discussed patient's case with Dr. Lazarus with nephrology who recommended discussion with vascular surgery, admission and temporizing his hyperkalemia.  Patient was given IV calcium , Lokelma  and temporizing measures for his hyperkalemia.  Reached out to vascular surgery Dr. Marea for further discussion, plan for temp catheter.     PROCEDURES:  Critical Care performed: yes  .Critical Care  Performed by: Suzanne Kirsch, MD Authorized by: Suzanne Kirsch, MD   Critical care provider statement:    Critical care time (minutes):  30   Critical care time was exclusive of:  Separately billable procedures and treating other patients   Critical care was  necessary to treat or prevent imminent or life-threatening deterioration of the following conditions:  Renal failure and metabolic crisis   Critical care was time spent personally by me on the following activities:  Development of treatment plan with patient or surrogate, discussions with consultants, evaluation of patient's response to treatment, examination of patient, ordering and review of laboratory studies, ordering and review of radiographic studies, ordering and performing treatments and interventions, pulse oximetry, re-evaluation of patient's condition and review of old charts   Care discussed with: admitting provider     Patient's presentation is most consistent with acute presentation with potential threat to life or bodily function.   MEDICATIONS ORDERED IN ED: Medications  insulin  aspart (novoLOG ) injection 5 Units (has no administration in time range)    And  dextrose  50 % solution 50 mL (has no administration in time range)  sodium zirconium cyclosilicate  (LOKELMA ) packet 10 g (has no administration in time range)  calcium  gluconate 1 g/ 50 mL sodium chloride  IVPB (has no administration in time range)  sodium bicarbonate  injection 50 mEq (has no administration in time range)    FINAL CLINICAL IMPRESSION(S) / ED DIAGNOSES   Final diagnoses:  Problem with dialysis access, initial encounter  Hyperkalemia     Rx / DC Orders   ED Discharge Orders     None        Note:  This document was prepared using Dragon voice recognition software and may include unintentional dictation errors.   Suzanne Kirsch, MD 03/03/24 1330

## 2024-03-03 NOTE — ED Notes (Signed)
 Cath team at bedside placing dialysis catheter.

## 2024-03-04 DIAGNOSIS — Z515 Encounter for palliative care: Secondary | ICD-10-CM | POA: Diagnosis not present

## 2024-03-04 DIAGNOSIS — E875 Hyperkalemia: Secondary | ICD-10-CM | POA: Diagnosis not present

## 2024-03-04 DIAGNOSIS — T82898A Other specified complication of vascular prosthetic devices, implants and grafts, initial encounter: Secondary | ICD-10-CM | POA: Diagnosis not present

## 2024-03-04 DIAGNOSIS — Z992 Dependence on renal dialysis: Secondary | ICD-10-CM

## 2024-03-04 DIAGNOSIS — N186 End stage renal disease: Secondary | ICD-10-CM

## 2024-03-04 DIAGNOSIS — T82590A Other mechanical complication of surgically created arteriovenous fistula, initial encounter: Secondary | ICD-10-CM | POA: Diagnosis not present

## 2024-03-04 DIAGNOSIS — E039 Hypothyroidism, unspecified: Secondary | ICD-10-CM | POA: Insufficient documentation

## 2024-03-04 LAB — CBC
HCT: 33.8 % — ABNORMAL LOW (ref 39.0–52.0)
Hemoglobin: 10.7 g/dL — ABNORMAL LOW (ref 13.0–17.0)
MCH: 30.7 pg (ref 26.0–34.0)
MCHC: 31.7 g/dL (ref 30.0–36.0)
MCV: 96.8 fL (ref 80.0–100.0)
Platelets: 142 K/uL — ABNORMAL LOW (ref 150–400)
RBC: 3.49 MIL/uL — ABNORMAL LOW (ref 4.22–5.81)
RDW: 13.5 % (ref 11.5–15.5)
WBC: 5.2 K/uL (ref 4.0–10.5)
nRBC: 0 % (ref 0.0–0.2)

## 2024-03-04 LAB — POTASSIUM
Potassium: 5.1 mmol/L (ref 3.5–5.1)
Potassium: 5.3 mmol/L — ABNORMAL HIGH (ref 3.5–5.1)
Potassium: 5.5 mmol/L — ABNORMAL HIGH (ref 3.5–5.1)

## 2024-03-04 LAB — HIV ANTIBODY (ROUTINE TESTING W REFLEX): HIV Screen 4th Generation wRfx: NONREACTIVE

## 2024-03-04 LAB — HEPATITIS B SURFACE ANTIBODY, QUANTITATIVE: Hep B S AB Quant (Post): 6.1 m[IU]/mL — ABNORMAL LOW

## 2024-03-04 LAB — HEPATITIS B SURFACE ANTIGEN: Hepatitis B Surface Ag: NONREACTIVE

## 2024-03-04 MED ORDER — CARVEDILOL 6.25 MG PO TABS
6.2500 mg | ORAL_TABLET | Freq: Two times a day (BID) | ORAL | Status: DC
  Administered 2024-03-04 – 2024-03-07 (×8): 6.25 mg via ORAL
  Filled 2024-03-04 (×9): qty 1

## 2024-03-04 MED ORDER — NICOTINE POLACRILEX 2 MG MT GUM: 2.0000 mg | CHEWING_GUM | OROMUCOSAL | Status: DC | PRN

## 2024-03-04 MED ORDER — RENA-VITE PO TABS
1.0000 | ORAL_TABLET | Freq: Every day | ORAL | Status: DC
  Administered 2024-03-04 – 2024-03-06 (×2): 1 via ORAL
  Filled 2024-03-04 (×6): qty 1

## 2024-03-04 MED ORDER — DEXTROSE 50 % IV SOLN: 1.0000 | INTRAVENOUS | Status: AC | PRN

## 2024-03-04 MED ORDER — LEVOTHYROXINE SODIUM 50 MCG PO TABS
50.0000 ug | ORAL_TABLET | Freq: Every day | ORAL | Status: AC
Start: 2024-03-04 — End: ?
  Administered 2024-03-04 – 2024-03-07 (×4): 50 ug via ORAL
  Filled 2024-03-04 (×5): qty 1

## 2024-03-04 MED ORDER — ASPIRIN 81 MG PO TBEC
81.0000 mg | DELAYED_RELEASE_TABLET | Freq: Every day | ORAL | Status: DC
  Administered 2024-03-04 – 2024-03-07 (×3): 81 mg via ORAL
  Filled 2024-03-04 (×5): qty 1

## 2024-03-04 MED ORDER — PANTOPRAZOLE SODIUM 40 MG PO TBEC
40.0000 mg | DELAYED_RELEASE_TABLET | Freq: Every day | ORAL | Status: DC
  Administered 2024-03-04 – 2024-03-07 (×3): 40 mg via ORAL
  Filled 2024-03-04 (×5): qty 1

## 2024-03-04 MED ORDER — NICOTINE 21 MG/24HR TD PT24: 21.0000 mg | MEDICATED_PATCH | Freq: Every day | TRANSDERMAL | Status: DC | PRN

## 2024-03-04 MED ORDER — CALCIUM ACETATE (PHOS BINDER) 667 MG PO CAPS
1334.0000 mg | ORAL_CAPSULE | Freq: Three times a day (TID) | ORAL | Status: DC
  Administered 2024-03-04 – 2024-03-07 (×7): 1334 mg via ORAL
  Filled 2024-03-04 (×11): qty 2

## 2024-03-04 MED ORDER — ATORVASTATIN CALCIUM 20 MG PO TABS
40.0000 mg | ORAL_TABLET | Freq: Every day | ORAL | Status: DC
  Administered 2024-03-04 – 2024-03-07 (×3): 40 mg via ORAL
  Filled 2024-03-04 (×5): qty 2

## 2024-03-04 NOTE — Consult Note (Signed)
 Consultation Note Date: 03/04/2024 at 1100   Patient Name: Larry Morrison  DOB: 25-Jun-1970  MRN: [id number]  Age / Sex: 53 y.o., male  PCP: [facility] Referring Physician: Cox, Greig SAILOR, DO  HPI/Patient Profile: 53 y.o. male  with past medical history of  ESRD (HD on MWF, patient known to miss sessions), learning disability, aneurysm with craniotomy, CVA (LE residual weakness), pulmonary HTN, combined CHF, wheelchair-bound, neurogenic bladder with right nephrostomy tube, severe protein malnutrition, anemia of chronic disease, tobacco abuse, history of TBI, PEA arrest (03/2021) and known medication noncompliance admitted on 03/03/2024 with dialysis access issue and hyperkalemia.  Patient being treated for hyperkalemia and need for vascular access for continuation of hemodialysis.  PMT was consulted to support patient with goals of care discussions.  Of note, patient is familiar to this provider as I met with patient during his November 2022 hospitalization.   Clinical Assessment and Goals of Care: Extensive chart review completed prior to meeting patient including labs, vital signs, imaging, progress notes, orders, and available advanced directive documents from current and previous encounters. I then met with vascular NP Gwendlyn Shank and RN outside of patient's room.  Then, I spoke with patient and RN at bedside to discuss diagnosis prognosis, GOC, EOL wishes, disposition and options.  I introduced Palliative Medicine as specialized medical care for people living with serious illness. It focuses on providing relief from the symptoms and stress of a serious illness. The goal is to improve quality of life for both the patient and the family.  I highlighted to the patient that I had met with him in the previous hospitalization.  Discussed that patient has a mother who is recently moved into a nursing facility  and a sister with him he has not been in touch with for quite some time.  We discussed his nickname remains peanut and that he resides in a group home, going on almost 5 years now.   We discussed patient's current illness and what it means in the larger context of patient's on-going co-morbidities.  Extensive discussion of need for temporary catheter to address hyperkalemia so that anesthesiology and vascular teams can attempt to declot patient's fistula.  Discussed patient has 2 options:  1-Continue to refuse temporary catheter.  Clarified multiple times that if patient does not get temporary catheter, he will not get dialysis, and thus he will face end-of-life.  2-Accept temporary catheter, utilize local anesthesia, pain medications, and anti-anxiolytics as needed to minimize pain during placement/useof temporary catheter.  This means patient could get dialysis, potassium could be lowered, the patient can move forward with attempting to declot his fistula.  This means patient would not be facing end-of-life due to rising creatinine and electrolyte imbalances.  When asked what patient's understanding of his current medical situation is, patient was able to recite that he needs a catheter for dialysis.  However, he continued to repeat that he does not want any pain, he has been through enough, he is too old to go through any more  pain, and if he cannot be put out then he is fine with facing death.  Therapeutic silence, active listening, and emotional support provided.  Reiterated to patient numerous times that efforts can be made to minimize his pain for placement/use of temporary catheter as a means to ultimately get his fistula functioning and return to outpatient dialysis.  However, despite giving patient both options and outcomes of both choices, he continues to refuse placement of catheter.  I offered to speak with patient's family so they may offer support to him during this stressful time.   Patient politely declined.  I notified patient that I will be speaking with his legal guardian Larry Morrison over the phone.    After meeting with vascular NP, RN, and patient at bedside, I spoke with patient's legal guardian Larry Morrison over the phone in regards to natural disease trajectory and patient's options.   Medical update given.  Discussed need for temporary catheter placement in order to proceed with dialysis.  Discussed that temporary catheter cannot be placed unless potassium is below  5 - currently at 5.4.  Plan currently is to treat hyperkalemia with oral medications and continue to watch the trend of potassium.   She shares understanding of his current dilemma and remains in agreement with current plan of care. She asks if patient could receive a anxiety pill while getting the temporary catheter placed.  Advised her that patient could receive medication appropriate for his level of pain and anxiety during placement of catheter.  However, we cannot place catheter against patient's will/wishes -with or without an antianxiety medication on board.  She also shares that patient 100% understands what is going on with him.  She shares that she will discuss his case with her supervisor.  PMT contact info given.  Encouraged Larry Morrison to notify medical team/PMT of any changes to plan of care.  Additionally, I requested that RN notify team if patient changes his mind and is accepting of temporary catheter.  Primary Decision Maker LEGAL GUARDIAN - Larry Morrison  Physical Exam Vitals reviewed.  Constitutional:      General: He is not in acute distress.    Appearance: He is normal weight. He is not ill-appearing.  HENT:     Head: Normocephalic.     Mouth/Throat:     Mouth: Mucous membranes are moist.  Eyes:     Pupils: Pupils are equal, round, and reactive to light.  Pulmonary:     Effort: Pulmonary effort is normal.  Abdominal:     Palpations: Abdomen is soft.  Skin:    General: Skin is warm  and dry.  Neurological:     Mental Status: He is alert and oriented to person, place, and time.  Psychiatric:        Mood and Affect: Mood normal.        Behavior: Behavior normal.        Thought Content: Thought content normal.        Judgment: Judgment normal.     Palliative Assessment/Data: 60%     Thank you for this consult. Palliative medicine will continue to follow and assist holistically.   Visit includes: Detailed review of medical records (labs, imaging, vital signs), medically appropriate exam (mental status, respiratory, cardiac, skin), discussed with treatment team, counseling and educating patient, family and staff, documenting clinical information, medication management and coordination of care.  Signed by: Lamarr Gunner, DNP, FNP-BC Palliative Medicine   Please contact Palliative Medicine Team providers via [facility] for questions and concerns.

## 2024-03-04 NOTE — ED Notes (Signed)
 Pharmacist consulted about timing of administration of calcium  acetate.

## 2024-03-04 NOTE — Progress Notes (Signed)
 Pt receives outpt HD at [facility] on MWF. Navigator following to assist with any HD needs.  Suzen Satchel Dialysis Navigator [phone]

## 2024-03-04 NOTE — ED Notes (Signed)
 Went to give Phoslo . Pt sleeping. Respirations are unlabored.

## 2024-03-04 NOTE — ED Notes (Signed)
 Report given to [facility]

## 2024-03-04 NOTE — Progress Notes (Signed)
 I had a long detailed discussion at the bedside again today with the patient.  Patient needs temporary dialysis catheter placed in order to do Hemodialysis to bring his potassium levels down to 5.0 or below.  Patient is requesting to be put to sleep in order to have a temporary dialysis catheter, permacatheter and a declot to his arm.  In order to do this anesthesia requires patient's potassium to be at 5.0 or below for his safety.  He has been medically managed to try to reduce his potassium but cannot seem to get to 5.0 or below.   Therefore in clarifying the patient's wishes the patient is demanding to be put to sleep for any of the procedures he needs.  Unfortunately his potassium levels need to be 5.0 or less in order to be put to sleep by anesthesia and we are unable to get his potassium to that level.  Therefore the patient is ultimately refusing any type of intervention such as catheter placement to start hemodialysis or declot to his fistula for hemodialysis.  Patient was made aware that this can impact his health to the point where it could incur death and the patient verbalizes understanding and still refuses any type of intervention unless being put to sleep.  Dr. Selinda Gu MD was made aware of the situation.

## 2024-03-04 NOTE — ED Notes (Addendum)
 Patient ate his meat off his dinner tray and dessert. Patient has snack bag at bedside.

## 2024-03-04 NOTE — Hospital Course (Addendum)
 Mr. Cederic Mozley is a 53 year old male with history of end-stage renal disease on hemodialysis MWF, history of noncompliance, frequent vascular access difficulty, hypertension, hyperlipidemia, hypothyroid, history of tobacco use, GERD, presents to the ED for chief concerns of fistula port access not working.  03/03/2024: Patient admitted to hospitalist service for vascular access, hyperkalemia, and continuation of hemodialysis.  03/04/2024: Potassium mildly improved to 5.5 from 6.2. At this time, vascular surgery has been aborted due to patient refusal.  Palliative care has been consulted and pending recommendation.  Patient refuses any needles without being sedated.  he refuses enoxaparin/DVT prophylaxis.

## 2024-03-04 NOTE — Assessment & Plan Note (Addendum)
 As needed nicotine  patch, Nicorette gum

## 2024-03-04 NOTE — ED Notes (Signed)
 This tech changed patients soiled diaper.

## 2024-03-04 NOTE — ED Notes (Signed)
 Social Worker [facility] called to verify that patient was refusing the temporary dialysis catheter and the heparin  medication.

## 2024-03-04 NOTE — Progress Notes (Signed)
 [facility]  ROUNDING NOTE   Subjective:   Larry Morrison is a 53 year old African-American male with past medical history including pulmonary hypertension, combined CHF, CVA, hypertension, neurogenic bladder with right nephrostomy tube, and end-stage renal disease on hemodialysis patient presents to the emergency department for evaluation of dialysis access and has been admitted for Hyperkalemia [E87.5] End stage renal failure on dialysis (HCC) [N18.6, Z99.2] Problem with dialysis access, initial encounter [T82.898A]   Patient is known to our practice and receives outpatient dialysis treatments at [facility] on a MWF schedule, supervised by Dr. Douglas.  Last treatment received on Friday.    Patient seen resting in bed Alert and oriented, appears more calm today Partially completed breakfast tray at bedside  Potassium 5.5  Objective:  Vital signs in last 24 hours:  Temp:  [97.6 F (36.4 C)-98 F (36.7 C)] 98 F (36.7 C) (10/14 0905) Pulse Rate:  [54-71] 67 (10/14 1230) Resp:  [12-26] 26 (10/14 1230) BP: (129-181)/(70-99) 163/91 (10/14 1200) SpO2:  [98 %-100 %] 100 % (10/14 1230)  Weight change:  Filed Weights   03/03/24 1149  Weight: 59 kg    Intake/Output: No intake/output data recorded.   Intake/Output this shift:  Total I/O In: -  Out: 400 [Urine:400]  Physical Exam: General: NAD  Head: Normocephalic, atraumatic. Moist oral mucosal membranes  Eyes: Anicteric  Lungs:  Clear to auscultation, normal effort  Heart: Regular rate and rhythm  Abdomen:  Soft, nontender  Extremities: No peripheral edema.  Neurologic: Awake, alert, conversant  Skin: Warm,dry, no rash  Access: Left upper aVF    Basic Metabolic Panel: Recent Labs  Lab 03/03/24 1233 03/03/24 1426 03/03/24 1559 03/03/24 1952 03/04/24 0003 03/04/24 0638  NA 138  --  137  --   --   --   K 6.2* 4.7 5.3* 5.4* 5.1 5.5*  CL 99  --  96*  --   --   --   CO2 24  --  26  --   --   --    GLUCOSE 78  --  62*  --   --   --   BUN 81*  --  85*  --   --   --   CREATININE 11.76*  --  11.41*  --   --   --   CALCIUM  8.4*  --  10.2  --   --   --   MG 2.2  --   --   --   --   --   PHOS 4.9*  --  4.4 4.4  --   --     Liver Function Tests: Recent Labs  Lab 03/03/24 1559  ALBUMIN  3.1*   No results for input(s): LIPASE, AMYLASE in the last 168 hours. No results for input(s): AMMONIA in the last 168 hours.  CBC: Recent Labs  Lab 03/03/24 1233 03/04/24 0638  WBC 5.4 5.2  HGB 11.9* 10.7*  HCT 36.4* 33.8*  MCV 96.8 96.8  PLT 154 142*    Cardiac Enzymes: No results for input(s): CKTOTAL, CKMB, CKMBINDEX, TROPONINI in the last 168 hours.  BNP: Invalid input(s): POCBNP  CBG: Recent Labs  Lab 03/03/24 1518 03/03/24 1921  GLUCAP 96 111*    Microbiology: Results for orders placed or performed during the hospital encounter of 09/19/23  Urine Culture     Status: None   Collection Time: 09/19/23 10:51 AM   Specimen: Urine, Clean Catch  Result Value Ref Range Status   Specimen  Description   Final    URINE, CLEAN CATCH Performed at [facility], [address] [place], [place] [address]    Special Requests   Final    NONE Performed at [facility], [address], [place], [place] [address]    Culture   Final    NO GROWTH Performed at [facility], [address] [place], [place] [address]    Report Status 09/21/2023 FINAL  Final    Coagulation Studies: No results for input(s): LABPROT, INR in the last 72 hours.  Urinalysis: No results for input(s): COLORURINE, LABSPEC, PHURINE, GLUCOSEU, HGBUR, BILIRUBINUR, KETONESUR, PROTEINUR, UROBILINOGEN, NITRITE, LEUKOCYTESUR in the last 72 hours.  Invalid input(s): APPERANCEUR    Imaging: ABORTED INVASIVE LAB PROCEDURE Result Date: 03/04/2024 See surgical note for result.    Medications:     albuterol   2.5 mg Nebulization Q6H    aspirin  EC  81 mg Oral Daily   atorvastatin   40 mg Oral Daily   calcium  acetate  1,334 mg Oral TID WC   carvedilol   6.25 mg Oral BID   Chlorhexidine  Gluconate Cloth  6 each Topical Q0600   heparin   5,000 Units Subcutaneous Q8H   levothyroxine  50 mcg Oral Q0600   multivitamin  1 tablet Oral QHS   pantoprazole   40 mg Oral Daily   patiromer   16.8 g Oral Daily   dextrose , fentaNYL  (SUBLIMAZE ) injection, nicotine  **OR** nicotine  polacrilex  Assessment/ Plan:  [name] is a 53 y.o.  male  with past medical history including pulmonary hypertension, combined CHF, CVA, hypertension, neurogenic bladder with right nephrostomy tube, and end-stage renal disease on hemodialysis   [facility]/MWF/Rt Permcath   End-stage renal disease with hyperkalemia on hemodialysis.  Last treatment completed on Friday.  Patient refused HD temp cath yesterday and shifting measures used to manage potassium levels.  Potassium remains 5.5 today.  Continue daily Veltassa , can consider additional dosing of Lokelma  as needed.  Will complete dialysis once access is available.  2.  Malfunctioning dialysis access, left upper arm aVF without bruit or thrill.  Vascular surgery consulted and will evaluate and treat access.  3. Anemia of chronic kidney disease Lab Results  Component Value Date   HGB 10.7 (L) 03/04/2024   Hgb remains stable  4. Hypertension with chronic kidney disease. Home regimen includes carvedilol  and irbesartan .  Carvedilol  resumed   LOS: 1 Larry Morrison 10/14/20251:35 PM

## 2024-03-04 NOTE — Assessment & Plan Note (Addendum)
 Vascular surgery has been consulted, Dr. Marea is aware

## 2024-03-04 NOTE — ED Notes (Addendum)
 Vein and vascular staff at bedside.

## 2024-03-04 NOTE — Assessment & Plan Note (Addendum)
 Albuterol  nebulizer, insulin  5 units and dextrose  50 1 AM, Veltassa  16.8 g p.o., sodium bicarbonate  50 mEq IV one-time dose, Lokelma  10 g p.o.  Repeat potassium on 03/04/2024 at 1530-hour

## 2024-03-04 NOTE — ED Notes (Signed)
 Messaged pharmacy for missing Veltassa .

## 2024-03-04 NOTE — ED Notes (Signed)
 Called lab to draw 10am potassium level. IV not drawing back.

## 2024-03-04 NOTE — Assessment & Plan Note (Signed)
 -  Levothyroxine 50 mcg daily resumed

## 2024-03-04 NOTE — Assessment & Plan Note (Signed)
-   Nephrology has been consulted

## 2024-03-04 NOTE — Progress Notes (Addendum)
 PROGRESS NOTE  Larry Morrison  FMW:[id number] DOB: April 13, 1971 DOA: 03/03/2024 PCP: [facility]   [name] is a 53 year old male with history of end-stage renal disease on hemodialysis MWF, history of noncompliance, frequent vascular access difficulty, hypertension, hyperlipidemia, hypothyroid, history of tobacco use, GERD, presents to the ED for chief concerns of fistula port access not working.  03/03/2024: Patient admitted to hospitalist service for vascular access, hyperkalemia, and continuation of hemodialysis.  03/04/2024: Potassium mildly improved to 5.5 from 6.2. At this time, vascular surgery has been aborted due to patient refusal.  Palliative care has been consulted and pending recommendation.  Patient refuses any needles without being sedated.  he refuses enoxaparin/DVT prophylaxis.  Assessment & Plan:   Principal Problem:   Dialysis AV fistula malfunction, initial encounter Active Problems:   ESRD on hemodialysis (HCC)   Hyperlipidemia   Tobacco use disorder   Essential hypertension   Hyperkalemia   Protein-calorie malnutrition, severe   Problem with dialysis access   Hypothyroidism   Assessment and Plan:  * Dialysis AV fistula malfunction, initial encounter Vascular surgery has been consulted, Dr. Marea is aware  ESRD on hemodialysis [facility]) Nephrology has been consulted  Hyperlipidemia Atorvastatin  40 mg daily  Hypothyroidism Levothyroxine 50 mcg daily resumed  Hyperkalemia Albuterol  nebulizer, insulin  5 units and dextrose  50 1 AM, Veltassa  16.8 g p.o., sodium bicarbonate  50 mEq IV one-time dose, Lokelma  10 g p.o.  Repeat potassium on 03/04/2024 at 1530-hour  Essential hypertension Carvedilol  6.25 mg p.o. twice daily  Tobacco use disorder As needed nicotine  patch, Nicorette gum  DVT prophylaxis: Heparin  5000 units subcutaneous every 8 hours Code Status: Full code Family Communication: No Disposition Plan: Pending clinical course,  guarded prognosis Level of care: Progressive  Consultants:  Vascular, nephrology, palliative  Procedures:  Pending temporary dialysis catheter placement  Antimicrobials: None indicated at this time  Subjective: At bedside, patient is able to tell me his first and last name, age, location of [facility].  He states that the current month is October.  He was not able to tell me the current calendar year, stating his 2005  Reports he just cannot tolerate the pain.  He cannot tolerate any subcutaneous needles.  He reports he wants to be sedated for all needlesticks, procedures.  Objective: Vitals:   03/04/24 1100 03/04/24 1200 03/04/24 1215 03/04/24 1230  BP: 138/85 (!) 163/91    Pulse: (!) 54 61 71 67  Resp: 18 20 (!) 25 (!) 26  Temp:      TempSrc:      SpO2: 100% 100% 100% 100%  Weight:      Height:       Intake/Output Summary (Last 24 hours) at 03/04/2024 1528 Last data filed at 03/04/2024 1046 Gross per 24 hour  Intake --  Output 400 ml  Net -400 ml   Filed Weights   03/03/24 1149  Weight: 59 kg   Examination:  General exam: Frail, cachectic appearing, appears older than chronological age Respiratory system: Clear to auscultation. Respiratory effort normal. Cardiovascular system: S1 & S2 heard, RRR. No JVD, murmurs, rubs, gallops or clicks. No pedal edema. Gastrointestinal system: Abdomen is nondistended, soft and nontender. No organomegaly or masses felt. Normal bowel sounds heard. Central nervous system: Alert and oriented. No focal neurological deficits. Extremities: Symmetric 5 x 5 power.  Left upper extremity fistula was negative for bruits or thrills. Skin: No rashes, lesions or ulcers Psychiatry: Lacks judgement and insight. Mood & affect appropriate.  Data Reviewed: I have personally reviewed following labs and imaging studies  CBC: Recent Labs  Lab 03/03/24 1233 03/04/24 0638  WBC 5.4 5.2  HGB 11.9* 10.7*  HCT 36.4* 33.8*   MCV 96.8 96.8  PLT 154 142*   Basic Metabolic Panel: Recent Labs  Lab 03/03/24 1233 03/03/24 1426 03/03/24 1559 03/03/24 1952 03/04/24 0003 03/04/24 0638  NA 138  --  137  --   --   --   K 6.2* 4.7 5.3* 5.4* 5.1 5.5*  CL 99  --  96*  --   --   --   CO2 24  --  26  --   --   --   GLUCOSE 78  --  62*  --   --   --   BUN 81*  --  85*  --   --   --   CREATININE 11.76*  --  11.41*  --   --   --   CALCIUM  8.4*  --  10.2  --   --   --   MG 2.2  --   --   --   --   --   PHOS 4.9*  --  4.4 4.4  --   --    GFR: Estimated Creatinine Clearance: 6.1 mL/min (A) (by C-G formula based on SCr of 11.41 mg/dL (H)).  Liver Function Tests: Recent Labs  Lab 03/03/24 1559  ALBUMIN  3.1*   CBG: Recent Labs  Lab 03/03/24 1518 03/03/24 1921  GLUCAP 96 111*   Scheduled Meds:  albuterol   2.5 mg Nebulization Q6H   aspirin  EC  81 mg Oral Daily   atorvastatin   40 mg Oral Daily   calcium  acetate  1,334 mg Oral TID WC   carvedilol   6.25 mg Oral BID   Chlorhexidine  Gluconate Cloth  6 each Topical Q0600   heparin   5,000 Units Subcutaneous Q8H   levothyroxine  50 mcg Oral Q0600   multivitamin  1 tablet Oral QHS   pantoprazole   40 mg Oral Daily   patiromer   16.8 g Oral Daily    LOS: 1 day   Time spent: 50 minutes  Dr. Sherre [facility] If 7PM-7AM, please contact night-coverage 03/04/2024, 3:28 PM

## 2024-03-04 NOTE — Assessment & Plan Note (Signed)
 Atorvastatin 40 mg daily

## 2024-03-04 NOTE — ED Notes (Signed)
 Katherine NP from Palliative is at bedside. Patient continues to decline the temporary dialysis catheter due to fear of pain. Patient requested a house phone and was given one.

## 2024-03-04 NOTE — ED Notes (Signed)
 Dr. Sherre informed of patient's refusal of heparin  SQ.

## 2024-03-04 NOTE — ED Notes (Signed)
 Patient asked for vanilla ice cream. Will defer to night nurse.

## 2024-03-04 NOTE — Assessment & Plan Note (Signed)
-   Carvedilol 6.25 mg p.o. twice daily

## 2024-03-04 NOTE — ED Notes (Signed)
 Pt given ice cream per request. Call light in reach; no other complaints at this time.

## 2024-03-04 NOTE — ED Notes (Signed)
 Patient given a telephone.

## 2024-03-05 DIAGNOSIS — N186 End stage renal disease: Secondary | ICD-10-CM | POA: Diagnosis not present

## 2024-03-05 DIAGNOSIS — T82590A Other mechanical complication of surgically created arteriovenous fistula, initial encounter: Secondary | ICD-10-CM | POA: Diagnosis not present

## 2024-03-05 DIAGNOSIS — T82590D Other mechanical complication of surgically created arteriovenous fistula, subsequent encounter: Secondary | ICD-10-CM | POA: Diagnosis not present

## 2024-03-05 DIAGNOSIS — Z515 Encounter for palliative care: Secondary | ICD-10-CM | POA: Diagnosis not present

## 2024-03-05 DIAGNOSIS — T8241XA Breakdown (mechanical) of vascular dialysis catheter, initial encounter: Secondary | ICD-10-CM | POA: Diagnosis present

## 2024-03-05 DIAGNOSIS — T82898A Other specified complication of vascular prosthetic devices, implants and grafts, initial encounter: Secondary | ICD-10-CM | POA: Diagnosis not present

## 2024-03-05 LAB — CBC
HCT: 29.4 % — ABNORMAL LOW (ref 39.0–52.0)
Hemoglobin: 9.7 g/dL — ABNORMAL LOW (ref 13.0–17.0)
MCH: 31.5 pg (ref 26.0–34.0)
MCHC: 33 g/dL (ref 30.0–36.0)
MCV: 95.5 fL (ref 80.0–100.0)
Platelets: 127 K/uL — ABNORMAL LOW (ref 150–400)
RBC: 3.08 MIL/uL — ABNORMAL LOW (ref 4.22–5.81)
RDW: 13.7 % (ref 11.5–15.5)
WBC: 5.9 K/uL (ref 4.0–10.5)
nRBC: 0 % (ref 0.0–0.2)

## 2024-03-05 LAB — BASIC METABOLIC PANEL WITH GFR
Anion gap: 20 — ABNORMAL HIGH (ref 5–15)
Anion gap: 19 — ABNORMAL HIGH (ref 5–15)
BUN: 91 mg/dL — ABNORMAL HIGH (ref 6–20)
BUN: 96 mg/dL — ABNORMAL HIGH (ref 6–20)
CO2: 19 mmol/L — ABNORMAL LOW (ref 22–32)
CO2: 19 mmol/L — ABNORMAL LOW (ref 22–32)
Calcium: 8 mg/dL — ABNORMAL LOW (ref 8.9–10.3)
Calcium: 7.8 mg/dL — ABNORMAL LOW (ref 8.9–10.3)
Chloride: 100 mmol/L (ref 98–111)
Chloride: 102 mmol/L (ref 98–111)
Creatinine, Ser: 14.35 mg/dL — ABNORMAL HIGH (ref 0.61–1.24)
Creatinine, Ser: 14.23 mg/dL — ABNORMAL HIGH (ref 0.61–1.24)
GFR, Estimated: 4 mL/min — ABNORMAL LOW (ref >60)
GFR, Estimated: 4 mL/min — ABNORMAL LOW (ref >60)
Glucose, Bld: 82 mg/dL (ref 70–99)
Glucose, Bld: 80 mg/dL (ref 70–99)
Potassium: 5.6 mmol/L — ABNORMAL HIGH (ref 3.5–5.1)
Potassium: 6 mmol/L — ABNORMAL HIGH (ref 3.5–5.1)
Sodium: 139 mmol/L (ref 135–145)
Sodium: 140 mmol/L (ref 135–145)

## 2024-03-05 LAB — CBG MONITORING, ED: Glucose-Capillary: 78 mg/dL (ref 70–99)

## 2024-03-05 MED ORDER — ALBUTEROL SULFATE (2.5 MG/3ML) 0.083% IN NEBU: 2.5000 mg | INHALATION_SOLUTION | RESPIRATORY_TRACT | Status: DC | PRN

## 2024-03-05 MED ORDER — SODIUM ZIRCONIUM CYCLOSILICATE 10 G PO PACK
10.0000 g | PACK | Freq: Every day | ORAL | Status: DC
  Administered 2024-03-05: 10 g via ORAL
  Filled 2024-03-05 (×3): qty 1

## 2024-03-05 MED ORDER — PATIROMER SORBITEX CALCIUM 8.4 G PO PACK
16.8000 g | PACK | Freq: Every day | ORAL | Status: DC
  Administered 2024-03-05: 16.8 g via ORAL
  Filled 2024-03-05 (×3): qty 2

## 2024-03-05 MED ORDER — PATIROMER SORBITEX CALCIUM 8.4 G PO PACK
8.4000 g | PACK | Freq: Once | ORAL | Status: AC
  Administered 2024-03-05: 8.4 g via ORAL
  Filled 2024-03-05: qty 1

## 2024-03-05 MED ORDER — ALBUTEROL SULFATE (2.5 MG/3ML) 0.083% IN NEBU: 2.5000 mg | INHALATION_SOLUTION | Freq: Four times a day (QID) | RESPIRATORY_TRACT | Status: DC

## 2024-03-05 NOTE — ED Notes (Signed)
 CALLED NURSE  PAM TO INFORMED BED READY

## 2024-03-05 NOTE — Progress Notes (Addendum)
 PROGRESS NOTE  Larry Morrison  FMW:[id number] DOB: 11-09-1970 DOA: 03/03/2024 PCP: [facility]   [name] is a 53 year old male with history of end-stage renal disease on hemodialysis MWF, history of noncompliance, frequent vascular access difficulty, hypertension, hyperlipidemia, hypothyroid, history of tobacco use, GERD, presents to the ED for chief concerns of fistula port access not working.  03/03/2024: Patient admitted to hospitalist service for vascular access, hyperkalemia, and continuation of hemodialysis.  03/04/2024: Potassium mildly improved to 5.5 from 6.2. At this time, vascular surgery has been aborted due to patient refusal.  Palliative care has been consulted and pending recommendation.  Patient refuses any needles without being sedated.  he refuses enoxaparin/DVT prophylaxis. He does not have decision making capacity and has a legal guardian [employer])   Assessment & Plan:   Principal Problem:   Dialysis AV fistula malfunction, initial encounter Active Problems:   ESRD on hemodialysis (HCC)   Hyperlipidemia   Tobacco use disorder   Essential hypertension   Hyperkalemia   Protein-calorie malnutrition, severe   Problem with dialysis access   Hypothyroidism   Palliative care by specialist   Hemodialysis graft malfunction   Assessment and Plan:  * Dialysis AV fistula malfunction Vascular surgery has been consulted, Dr. Marea is aware.    He continues to refuse temporary HD catheter, though he does not have decision making capacity and has a legal guardian.   ESRD on hemodialysis (HCC) AGMA In the setting of ESRD and patient has not gotten HD. BUN elevated. Nephrology has been consulted and managing his elevated potassium medically.   Hyperlipidemia Atorvastatin  40 mg daily  Hypothyroidism Levothyroxine 50 mcg daily resumed  Hyperkalemia Potassium remains elevated despite multiple medications.  Nephrology following patient  remains on Veltassa   Essential hypertension Carvedilol  6.25 mg p.o. twice daily  Tobacco use disorder As needed nicotine  patch, Nicorette gum  DVT prophylaxis: Heparin  5000 units subcutaneous every 8 hours Code Status: Full code Family Communication: No, Legal guardian is [employer]  Disposition Plan: Pending clinical course, guarded prognosis Level of care: Telemetry Medical  Consultants:  Vascular, nephrology, palliative  Procedures:  Pending temporary dialysis catheter placement  Antimicrobials: None indicated at this time  Subjective: Patient states that he is not interested in getting a temporary hemodialysis catheter if he cannot be completely sedated.  Objective: Vitals:   03/05/24 1200 03/05/24 1300 03/05/24 1400 03/05/24 1500  BP: (!) 148/96 (!) 171/90 (!) 177/117 (!) 149/120  Pulse: (!) 58 63    Resp: 16 (!) 22 (!) 23   Temp:      TempSrc:      SpO2: 100% 100%    Weight:      Height:       Intake/Output Summary (Last 24 hours) at 03/05/2024 1533 Last data filed at 03/04/2024 1751 Gross per 24 hour  Intake 237 ml  Output --  Net 237 ml   Filed Weights   03/03/24 1149  Weight: 59 kg   Examination:  Physical Exam  Constitutional: In no distress.  Thin Cardiovascular: Normal rate, regular rhythm. No lower extremity edema  Pulmonary: Non labored breathing on room air, no wheezing or rales.   Abdominal: Soft. Non distended and non tender Musculoskeletal: Normal range of motion.     Neurological: Alert and oriented to person, place, and time. Non focal  Skin: Skin is warm and dry.    Data Reviewed: I have personally reviewed following labs and imaging studies  CBC: Recent Labs  Lab  03/03/24 1233 03/04/24 0638 03/05/24 0430  WBC 5.4 5.2 5.9  HGB 11.9* 10.7* 9.7*  HCT 36.4* 33.8* 29.4*  MCV 96.8 96.8 95.5  PLT 154 142* 127*   Basic Metabolic Panel: Recent Labs  Lab 03/03/24 1233 03/03/24 1426 03/03/24 1559 03/03/24 1952  03/04/24 0003 03/04/24 0638 03/04/24 1557 03/05/24 0430 03/05/24 1101  NA 138  --  137  --   --   --   --  139 140  K 6.2*   < > 5.3* 5.4* 5.1 5.5* 5.3* 5.6* 6.0*  CL 99  --  96*  --   --   --   --  100 102  CO2 24  --  26  --   --   --   --  19* 19*  GLUCOSE 78  --  62*  --   --   --   --  82 80  BUN 81*  --  85*  --   --   --   --  91* 96*  CREATININE 11.76*  --  11.41*  --   --   --   --  14.35* 14.23*  CALCIUM  8.4*  --  10.2  --   --   --   --  8.0* 7.8*  MG 2.2  --   --   --   --   --   --   --   --   PHOS 4.9*  --  4.4 4.4  --   --   --   --   --    < > = values in this interval not displayed.   GFR: Estimated Creatinine Clearance: 4.9 mL/min (A) (by C-G formula based on SCr of 14.23 mg/dL (H)).  Liver Function Tests: Recent Labs  Lab 03/03/24 1559  ALBUMIN  3.1*   CBG: Recent Labs  Lab 03/03/24 1518 03/03/24 1921 03/05/24 0811  GLUCAP 96 111* 78   Scheduled Meds:  albuterol   2.5 mg Nebulization Q6H   aspirin  EC  81 mg Oral Daily   atorvastatin   40 mg Oral Daily   calcium  acetate  1,334 mg Oral TID WC   carvedilol   6.25 mg Oral BID   Chlorhexidine  Gluconate Cloth  6 each Topical Q0600   heparin   5,000 Units Subcutaneous Q8H   levothyroxine  50 mcg Oral Q0600   multivitamin  1 tablet Oral QHS   pantoprazole   40 mg Oral Daily   patiromer   16.8 g Oral Daily   sodium zirconium cyclosilicate   10 g Oral Daily    LOS: 2 days   Time spent:35 minutes  Alban Pepper, MD  [facility] If 7PM-7AM, please contact night-coverage 03/05/2024, 3:33 PM

## 2024-03-05 NOTE — ED Notes (Signed)
 CALLED NURSE TO INFORMED BED READY

## 2024-03-05 NOTE — Progress Notes (Signed)
 Palliative Care Progress Note, Assessment & Plan   Patient Name: Larry Morrison       Date: 03/05/2024 DOB: May 15, 1971  Age: 53 y.o. MRN#: [id number] Attending Physician: Franchot Novel, MD Primary Care Physician: [facility] Admit Date: 03/03/2024  Subjective: Patient is sitting up in bed, watching TV.  He is awake, alert, acknowledges my presence, and is able to make his wishes known.  No family or friends present at bedside during my visit.  HPI: 53 y.o. male  with past medical history of  ESRD (HD on MWF, patient known to miss sessions), learning disability, aneurysm with craniotomy, CVA (LE residual weakness), pulmonary HTN, combined CHF, wheelchair-bound, neurogenic bladder with right nephrostomy tube, severe protein malnutrition, anemia of chronic disease, tobacco abuse, history of TBI, PEA arrest (03/2021) and known medication noncompliance admitted on 03/03/2024 with dialysis access issue and hyperkalemia.   Patient being treated for hyperkalemia and need for vascular access for continuation of hemodialysis.   PMT was consulted to support patient with goals of care discussions.   Of note, patient is familiar to this provider as I met with patient during his November 2022 hospitalization.   Summary of counseling/coordination of care: Extensive chart review completed prior to meeting patient including labs, vital signs, imaging, progress notes, orders, and available advanced directive documents from current and previous encounters.   After reviewing the patient's chart and assessing the patient at bedside, I spoke with patient in regards to symptom management and goals of care.   Symptoms assessed.  Patient endorses hunger.  Discussed we are awaiting results of potassium to see if  patient can get Port-A-Cath declotted with anesthesia clearance.  If potassium remains elevated, patient will be able to continue with diet given he will not be able to undergo any procedure.  He continues to ask for food.  I attempted to discuss the bigger picture and boundaries of care with patient.  Specifically, I discussed the patient remains a full code.  In the event that his heart and his lungs were to seize, we would perform ACLS protocol, CPR, and placed patient on ventilatory support.  He shares he is not ready to die.  He states he wants to live.  I highlighted to the patient that he was saying yesterday that he was not afraid to die.  Discussed that patient has stated several times that he wants to be free from pain and that the pain caused by a temporary dialysis catheter is the reason why he is not moving forward with placement of temporary catheter.  He shares he has had enough in life, he has been through enough pain, and he does not want anything else painful to happen to him.  I attempted to make clear the contrary nature of patient's wishes - not wanting to be in pain but wanting to remain a full code and undergo ACLS protocol in the event of a cardiopulmonary arrest.    I attempted to have patient understand that he cannot be free from pain and avoid a temporary dialysis catheter but then expect to also be free from pain and undergo CPR/mechnical ventilatory support.  He shares that he wants things to remain just as  they are.    I shared that with his rising potassium level he is at risk for cardiac events.  He says he understands and wants us  to do anything to keep me alive.  I again cautioned patient that the medical team are not being allowed to do anything to keep him alive given he continues to refuse a temporary catheter.  He avoids discussing this issue and continues to repeat he is hungry and he wants things to stay the same.  After visiting with the patient and  getting little to no clarity on his boundaries of care, I attempted to speak with his legal guardian over the phone Larry Morrison.  No answer.  HIPAA compliant voicemail left with PMT contact info.  Full code remains.  No adjustment to plan of care at this time.  PMT will continue to follow and support.  Physical Exam Vitals reviewed.  Constitutional:      General: He is not in acute distress.    Appearance: He is normal weight.  HENT:     Head: Normocephalic.     Mouth/Throat:     Mouth: Mucous membranes are moist.  Eyes:     Pupils: Pupils are equal, round, and reactive to light.  Pulmonary:     Effort: Pulmonary effort is normal.  Abdominal:     Palpations: Abdomen is soft.  Skin:    General: Skin is warm and dry.  Neurological:     Mental Status: He is alert and oriented to person, place, and time.  Psychiatric:        Mood and Affect: Mood normal.        Behavior: Behavior normal.             Visit includes: Detailed review of medical records (labs, imaging, vital signs), medically appropriate exam (mental status, respiratory, cardiac, skin), discussed with treatment team, counseling and educating patient, family and staff, documenting clinical information, medication management and coordination of care.  Larry L. Arvid, DNP, FNP-BC Palliative Medicine Team

## 2024-03-05 NOTE — Progress Notes (Signed)
 [facility]  ROUNDING NOTE   Subjective:   Larry Morrison is a 53 year old African-American male with past medical history including pulmonary hypertension, combined CHF, CVA, hypertension, neurogenic bladder with right nephrostomy tube, and end-stage renal disease on hemodialysis patient presents to the emergency department for evaluation of dialysis access and has been admitted for Hyperkalemia [E87.5] End stage renal failure on dialysis (HCC) [N18.6, Z99.2] Problem with dialysis access, initial encounter [T82.898A] Hemodialysis graft malfunction [T82.41XA]   Patient is known to our practice and receives outpatient dialysis treatments at [facility] on a MWF schedule, supervised by Dr. Douglas.  Last treatment received on Friday.    Patient seen resting in bed Awaiting breakfast Alert and oriented Denies pain or discomfort Room air  Potassium 6.0  Objective:  Vital signs in last 24 hours:  Temp:  [97.5 F (36.4 C)-98 F (36.7 C)] 97.7 F (36.5 C) (10/15 1153) Pulse Rate:  [57-83] 83 (10/15 0800) Resp:  [17-30] 23 (10/15 0800) BP: (120-162)/(70-96) 148/93 (10/15 0800) SpO2:  [99 %-100 %] 100 % (10/15 0800)  Weight change:  Filed Weights   03/03/24 1149  Weight: 59 kg    Intake/Output: I/O last 3 completed shifts: In: 237 [P.O.:237] Out: 400 [Urine:400]   Intake/Output this shift:  No intake/output data recorded.  Physical Exam: General: NAD  Head: Normocephalic, atraumatic. Moist oral mucosal membranes  Eyes: Anicteric  Lungs:  Clear to auscultation, normal effort  Heart: Regular rate and rhythm  Abdomen:  Soft, nontender  Extremities: No peripheral edema.  Neurologic: Awake, alert, conversant  Skin: Warm,dry, no rash  Access: Left upper aVF    Basic Metabolic Panel: Recent Labs  Lab 03/03/24 1233 03/03/24 1426 03/03/24 1559 03/03/24 1952 03/04/24 0003 03/04/24 0638 03/04/24 1557 03/05/24 0430 03/05/24 1101  NA 138  --  137  --    --   --   --  139 140  K 6.2*   < > 5.3* 5.4* 5.1 5.5* 5.3* 5.6* 6.0*  CL 99  --  96*  --   --   --   --  100 102  CO2 24  --  26  --   --   --   --  19* 19*  GLUCOSE 78  --  62*  --   --   --   --  82 80  BUN 81*  --  85*  --   --   --   --  91* 96*  CREATININE 11.76*  --  11.41*  --   --   --   --  14.35* 14.23*  CALCIUM  8.4*  --  10.2  --   --   --   --  8.0* 7.8*  MG 2.2  --   --   --   --   --   --   --   --   PHOS 4.9*  --  4.4 4.4  --   --   --   --   --    < > = values in this interval not displayed.    Liver Function Tests: Recent Labs  Lab 03/03/24 1559  ALBUMIN  3.1*   No results for input(s): LIPASE, AMYLASE in the last 168 hours. No results for input(s): AMMONIA in the last 168 hours.  CBC: Recent Labs  Lab 03/03/24 1233 03/04/24 0638 03/05/24 0430  WBC 5.4 5.2 5.9  HGB 11.9* 10.7* 9.7*  HCT 36.4* 33.8* 29.4*  MCV 96.8 96.8 95.5  PLT 154 142* 127*    Cardiac Enzymes: No results for input(s): CKTOTAL, CKMB, CKMBINDEX, TROPONINI in the last 168 hours.  BNP: Invalid input(s): POCBNP  CBG: Recent Labs  Lab 03/03/24 1518 03/03/24 1921 03/05/24 0811  GLUCAP 96 111* 78    Microbiology: Results for orders placed or performed during the hospital encounter of 09/19/23  Urine Culture     Status: None   Collection Time: 09/19/23 10:51 AM   Specimen: Urine, Clean Catch  Result Value Ref Range Status   Specimen Description   Final    URINE, CLEAN CATCH Performed at [facility], [address] [place], [place] [address]    Special Requests   Final    NONE Performed at [facility], [address] [place], [place] [address]    Culture   Final    NO GROWTH Performed at [facility], [address], [place], [place] [address]    Report Status 09/21/2023 FINAL  Final    Coagulation Studies: No results for input(s): LABPROT, INR in the last 72 hours.  Urinalysis: No results for input(s):  COLORURINE, LABSPEC, PHURINE, GLUCOSEU, HGBUR, BILIRUBINUR, KETONESUR, PROTEINUR, UROBILINOGEN, NITRITE, LEUKOCYTESUR in the last 72 hours.  Invalid input(s): APPERANCEUR    Imaging: ABORTED INVASIVE LAB PROCEDURE Result Date: 03/04/2024 See surgical note for result.    Medications:     albuterol   2.5 mg Nebulization Q6H   aspirin  EC  81 mg Oral Daily   atorvastatin   40 mg Oral Daily   calcium  acetate  1,334 mg Oral TID WC   carvedilol   6.25 mg Oral BID   Chlorhexidine  Gluconate Cloth  6 each Topical Q0600   heparin   5,000 Units Subcutaneous Q8H   levothyroxine  50 mcg Oral Q0600   multivitamin  1 tablet Oral QHS   pantoprazole   40 mg Oral Daily   patiromer   16.8 g Oral Daily   dextrose , fentaNYL  (SUBLIMAZE ) injection, nicotine  **OR** nicotine  polacrilex  Assessment/ Plan:  [name] is a 53 y.o.  male  with past medical history including pulmonary hypertension, combined CHF, CVA, hypertension, neurogenic bladder with right nephrostomy tube, and end-stage renal disease on hemodialysis   [facility]/MWF/Rt Permcath   End-stage renal disease with hyperkalemia on hemodialysis.  Last treatment completed on Friday.  Patient refused HD temp cath.  Potassium elevated to 6.0 today.  Will continue Veltassa  16.8 mg.  Can consider Lokelma  if needed.  Patient will receive dialysis once access interventions completed.  2.  Malfunctioning dialysis access, left upper arm aVF without bruit or thrill.  Vascular surgery consulted and following.  Patient refuses procedure unless completely sedated however elevated potassium levels prevent anesthesia.  3. Anemia of chronic kidney disease Lab Results  Component Value Date   HGB 9.7 (L) 03/05/2024   Hgb acceptable for renal patient.  4. Hypertension with chronic kidney disease. Home regimen includes carvedilol  and irbesartan .  Carvedilol  continued.  Blood pressure 165/90.   LOS: 2 Abdiaziz Klahn 10/15/20251:05 PM

## 2024-03-06 ENCOUNTER — Encounter
Admission: EM | Disposition: A | Discharge status: Skilled Nursing Facility | Payer: Self-pay | Source: Home / Self Care | Attending: Student

## 2024-03-06 ENCOUNTER — Encounter: Payer: Self-pay | Admitting: Vascular Surgery

## 2024-03-06 ENCOUNTER — Ambulatory Visit (INDEPENDENT_AMBULATORY_CARE_PROVIDER_SITE_OTHER): Admitting: Podiatry

## 2024-03-06 DIAGNOSIS — Z538 Procedure and treatment not carried out for other reasons: Secondary | ICD-10-CM

## 2024-03-06 DIAGNOSIS — T82868A Thrombosis of vascular prosthetic devices, implants and grafts, initial encounter: Secondary | ICD-10-CM | POA: Diagnosis not present

## 2024-03-06 DIAGNOSIS — N186 End stage renal disease: Secondary | ICD-10-CM | POA: Diagnosis not present

## 2024-03-06 DIAGNOSIS — T82590D Other mechanical complication of surgically created arteriovenous fistula, subsequent encounter: Secondary | ICD-10-CM | POA: Diagnosis not present

## 2024-03-06 DIAGNOSIS — Z992 Dependence on renal dialysis: Secondary | ICD-10-CM | POA: Diagnosis not present

## 2024-03-06 DIAGNOSIS — E875 Hyperkalemia: Secondary | ICD-10-CM | POA: Diagnosis not present

## 2024-03-06 DIAGNOSIS — T82590A Other mechanical complication of surgically created arteriovenous fistula, initial encounter: Secondary | ICD-10-CM | POA: Diagnosis not present

## 2024-03-06 HISTORY — PX: DIALYSIS/PERMA CATHETER INSERTION: CATH118288

## 2024-03-06 LAB — BASIC METABOLIC PANEL WITH GFR
Anion gap: 17 — ABNORMAL HIGH (ref 5–15)
BUN: 124 mg/dL — ABNORMAL HIGH (ref 6–20)
CO2: 21 mmol/L — ABNORMAL LOW (ref 22–32)
Calcium: 8.2 mg/dL — ABNORMAL LOW (ref 8.9–10.3)
Chloride: 102 mmol/L (ref 98–111)
Creatinine, Ser: 15.6 mg/dL — ABNORMAL HIGH (ref 0.61–1.24)
GFR, Estimated: 3 mL/min — ABNORMAL LOW (ref >60)
Glucose, Bld: 77 mg/dL (ref 70–99)
Potassium: 5.4 mmol/L — ABNORMAL HIGH (ref 3.5–5.1)
Sodium: 140 mmol/L (ref 135–145)

## 2024-03-06 LAB — CBC
HCT: 31.8 % — ABNORMAL LOW (ref 39.0–52.0)
Hemoglobin: 10.5 g/dL — ABNORMAL LOW (ref 13.0–17.0)
MCH: 31.3 pg (ref 26.0–34.0)
MCHC: 33 g/dL (ref 30.0–36.0)
MCV: 94.9 fL (ref 80.0–100.0)
Platelets: 128 K/uL — ABNORMAL LOW (ref 150–400)
RBC: 3.35 MIL/uL — ABNORMAL LOW (ref 4.22–5.81)
RDW: 13.2 % (ref 11.5–15.5)
WBC: 5.1 K/uL (ref 4.0–10.5)
nRBC: 0 % (ref 0.0–0.2)

## 2024-03-06 SURGERY — DIALYSIS/PERMA CATHETER INSERTION
Anesthesia: Moderate Sedation | Laterality: Right

## 2024-03-06 MED ORDER — FENTANYL CITRATE (PF) 100 MCG/2ML IJ SOLN
INTRAMUSCULAR | Status: DC | PRN
  Administered 2024-03-06 (×2): 50 ug via INTRAVENOUS

## 2024-03-06 MED ORDER — MIDAZOLAM HCL 2 MG/2ML IJ SOLN
INTRAMUSCULAR | Status: AC
  Filled 2024-03-06: qty 2

## 2024-03-06 MED ORDER — FAMOTIDINE 20 MG PO TABS: 40.0000 mg | ORAL_TABLET | Freq: Once | ORAL | Status: DC | PRN

## 2024-03-06 MED ORDER — DIPHENHYDRAMINE HCL 50 MG/ML IJ SOLN: 50.0000 mg | Freq: Once | INTRAMUSCULAR | Status: DC | PRN

## 2024-03-06 MED ORDER — HEPARIN SODIUM (PORCINE) 10000 UNIT/ML IJ SOLN
INTRAMUSCULAR | Status: DC | PRN
  Administered 2024-03-06: 10000 [IU]

## 2024-03-06 MED ORDER — SODIUM CHLORIDE 0.9 % IV SOLN: INTRAVENOUS | Status: DC

## 2024-03-06 MED ORDER — CEFAZOLIN SODIUM-DEXTROSE 1-4 GM/50ML-% IV SOLN
INTRAVENOUS | Status: AC | PRN
  Administered 2024-03-06: 1 g via INTRAVENOUS

## 2024-03-06 MED ORDER — LIDOCAINE-EPINEPHRINE (PF) 1 %-1:200000 IJ SOLN
INTRAMUSCULAR | Status: DC | PRN
  Administered 2024-03-06: 20 mL

## 2024-03-06 MED ORDER — METHYLPREDNISOLONE SODIUM SUCC 125 MG IJ SOLR: 125.0000 mg | Freq: Once | INTRAMUSCULAR | Status: DC | PRN

## 2024-03-06 MED ORDER — CEFAZOLIN SODIUM-DEXTROSE 1-4 GM/50ML-% IV SOLN
INTRAVENOUS | Status: AC
  Filled 2024-03-06: qty 50

## 2024-03-06 MED ORDER — ACETAMINOPHEN 325 MG PO TABS
650.0000 mg | ORAL_TABLET | Freq: Four times a day (QID) | ORAL | Status: DC | PRN
  Administered 2024-03-07: 650 mg via ORAL
  Filled 2024-03-06: qty 2

## 2024-03-06 MED ORDER — ONDANSETRON HCL 4 MG/2ML IJ SOLN
4.0000 mg | Freq: Three times a day (TID) | INTRAMUSCULAR | Status: DC | PRN
  Administered 2024-03-06 – 2024-03-07 (×3): 4 mg via INTRAVENOUS
  Filled 2024-03-06 (×2): qty 2

## 2024-03-06 MED ORDER — MIDAZOLAM HCL (PF) 2 MG/2ML IJ SOLN
INTRAMUSCULAR | Status: DC | PRN
  Administered 2024-03-06 (×2): 1 mg via INTRAVENOUS

## 2024-03-06 MED ORDER — HEPARIN (PORCINE) IN NACL 1000-0.9 UT/500ML-% IV SOLN
INTRAVENOUS | Status: DC | PRN
  Administered 2024-03-06: 500 mL

## 2024-03-06 MED ORDER — MIDAZOLAM HCL 2 MG/ML PO SYRP: 8.0000 mg | ORAL_SOLUTION | Freq: Once | ORAL | Status: DC | PRN

## 2024-03-06 MED ORDER — FENTANYL CITRATE (PF) 100 MCG/2ML IJ SOLN
INTRAMUSCULAR | Status: AC
  Filled 2024-03-06: qty 2

## 2024-03-06 SURGICAL SUPPLY — 14 items
BIOPATCH RED 1 DISK 7.0 (GAUZE/BANDAGES/DRESSINGS) IMPLANT
CATH BEACON 5 .035 40 KMP TP (CATHETERS) IMPLANT
CATH CANNON HEMO 15FR 23CM (HEMODIALYSIS SUPPLIES) IMPLANT
COVER PROBE ULTRASOUND 5X96 (MISCELLANEOUS) IMPLANT
DERMABOND ADVANCED .7 DNX12 (GAUZE/BANDAGES/DRESSINGS) IMPLANT
GLIDEWIRE ADV .035X180CM (WIRE) IMPLANT
KIT DIALYSIS CATH TRI 30X13 (CATHETERS) IMPLANT
KIT MICROPUNCTURE VSI 5F STIFF (SHEATH) IMPLANT
NDL ENTRY 21GA 7CM ECHOTIP (NEEDLE) IMPLANT
NEEDLE ENTRY 21GA 7CM ECHOTIP (NEEDLE) IMPLANT
PACK ANGIOGRAPHY (CUSTOM PROCEDURE TRAY) ×1 IMPLANT
SHEATH BRITE TIP 6FRX11 (SHEATH) IMPLANT
SUT MNCRL AB 4-0 PS2 18 (SUTURE) IMPLANT
SUT PROLENE 0 CT 1 30 (SUTURE) IMPLANT

## 2024-03-06 NOTE — Progress Notes (Signed)
  Received patient in bed to unit.   Informed consent signed and in chart.    TX duration:2.30 hrs     Transported by  Hand-off given to patient's nurse.    Access used: Left internal jugular cath Access issues: venous and arterial pressures elevated machine kept going off.   Total UF removed: 600. D/c 28 mins early due to pt not feeling well. Pt vomited 100 ml. Notified floor nurse April. Medication(s) given: none Post HD VS: wnl Post HD weight: 52.4 kg     N. Laporsha Grealish LPN [facility]

## 2024-03-06 NOTE — H&P (View-Only) (Signed)
 Progress Note    03/06/2024 7:19 AM 3 Days Post-Op  Subjective:  Larry Morrison is a 53 y.o. male past medical history significant for ESRD on HD MWF with left AV fistula, who presents to the emergency department with concern for dialysis failure (03/03/2024).  States that he went to dialysis today but they were unable to do dialysis due to difficulty accessing his last AV fistula.  States that he used to have a catheter but does not have anymore.  Denies any other complaints.  Last had dialysis on Friday (02/29/2024). Vascular Surgery was consulted to place a temporary dialysis access catheter due to patients chronic elevated potassium. Patient will receive moderate sedation only for this procedure.      Vitals:   03/05/24 1945 03/06/24 0500  BP: (!) 189/91 (!) 149/85  Pulse: 63 64  Resp: 16 16  Temp: 97.9 F (36.6 C) 98.3 F (36.8 C)  SpO2: 98% 98%   Physical Exam: Cardiac:  RRR, Normal S1,S2. No Murmurs noted  Lungs:  Clear on auscultation throughout Incisions:  None  Extremities:  Palpable pulses throughout with no thrill or bruit in A/V Graft  Abdomen:  Positive bowel sounds, soft and non tender.  Neurologic: AAOX1, HX of brain aneurysm   CBC    Component Value Date/Time   WBC 5.9 03/05/2024 0430   RBC 3.08 (L) 03/05/2024 0430   HGB 9.7 (L) 03/05/2024 0430   HGB 13.5 10/22/2013 1732   HCT 29.4 (L) 03/05/2024 0430   HCT 40.8 10/22/2013 1732   PLT 127 (L) 03/05/2024 0430   PLT 261 10/22/2013 1732   MCV 95.5 03/05/2024 0430   MCV 94 10/22/2013 1732   MCH 31.5 03/05/2024 0430   MCHC 33.0 03/05/2024 0430   RDW 13.7 03/05/2024 0430   RDW 13.4 10/22/2013 1732   LYMPHSABS 1.4 09/19/2023 1051   LYMPHSABS 1.2 07/17/2012 1219   MONOABS 0.5 09/19/2023 1051   MONOABS 1.2 (H) 07/17/2012 1219   EOSABS 0.3 09/19/2023 1051   EOSABS 0.0 07/17/2012 1219   BASOSABS 0.1 09/19/2023 1051   BASOSABS 0.1 07/17/2012 1219    BMET    Component Value Date/Time   NA 140 03/06/2024  0532   NA 138 10/22/2013 1732   K 5.4 (H) 03/06/2024 0532   K 4.7 10/22/2013 1732   CL 102 03/06/2024 0532   CL 112 (H) 10/22/2013 1732   CO2 21 (L) 03/06/2024 0532   CO2 20 (L) 10/22/2013 1732   GLUCOSE 77 03/06/2024 0532   GLUCOSE 68 10/22/2013 1732   BUN 124 (H) 03/06/2024 0532   BUN 74 (H) 10/22/2013 1732   CREATININE 15.60 (H) 03/06/2024 0532   CREATININE 5.22 (H) 10/22/2013 1732   CALCIUM  8.2 (L) 03/06/2024 0532   CALCIUM  9.3 10/22/2013 1732   GFRNONAA 3 (L) 03/06/2024 0532   GFRNONAA 12 (L) 10/22/2013 1732   GFRAA 9 (L) 11/20/2019 1407   GFRAA 14 (L) 10/22/2013 1732    INR    Component Value Date/Time   INR 1.3 (H) 06/30/2021 0907   INR 1.2 07/17/2012 1219     Intake/Output Summary (Last 24 hours) at 03/06/2024 0719 Last data filed at 03/05/2024 1800 Gross per 24 hour  Intake 240 ml  Output --  Net 240 ml     Assessment/Plan:  53 y.o. male presents to [facility] emergency room for non functioning A/V graft. Patient was offered last week on arrival a temporary dialysis catheter access to be placed due to elevated Potassium levels. Patient  has strongly refused this catheter placement without general anesthesia. Patients potassium level placed him a great risk with the use of general anesthesia therefor he would not allow any catheter access.  3 Days Post-Op   PLAN Patients DSS Legal Guardian spoke to the patient and he will allow us  today to place a temporary catheter to his right groin using moderate sedation only. Therefore we will proceed with temporary dialysis catheter placement today. I spoke to the patient this morning and informed him of the risks, benefits and complications. He verbalized his understanding and said he will proceed. Patient does not need to be NPO for the procedure.   DVT prophylaxis:  Heparin  5000 units SQ Q 8hrs.    Gwendlyn JONELLE Shank [facility] 03/06/2024 7:19 AM

## 2024-03-06 NOTE — Plan of Care (Signed)

## 2024-03-06 NOTE — Op Note (Signed)
 OPERATIVE NOTE    PRE-OPERATIVE DIAGNOSIS: 1. ESRD 2. Hyperkalemia  POST-OPERATIVE DIAGNOSIS: same as above  PROCEDURE: Ultrasound guidance for vascular access to the left internal jugular vein Fluoroscopic guidance for placement of catheter Placement of a 23 cm tip to cuff tunneled hemodialysis catheter via the left internal jugular vein  SURGEON: Selinda Gu, MD  ANESTHESIA:  Local with Moderate conscious sedation for approximately 25 minutes using 2 mg of Versed  and 100 mcg of Fentanyl   ESTIMATED BLOOD LOSS: 3 cc  FLUORO TIME: less than one minute  CONTRAST: none  FINDING(S): 1.  Patent left internal jugular vein  SPECIMEN(S):  None  INDICATIONS:   Larry Morrison is a 53 y.o. male who presents with renal failure and hyperkalemia his left arm AV graft has failed.  His potassium is too high to consider declot of the graft, but is not in range we can give him sedation for a PermCath which he has requested is he is refused temporary dialysis catheter on multiple occasions on this admission.  The patient needs long term dialysis access for their ESRD, and a Permcath is necessary.  Risks and benefits are discussed and informed consent is obtained.    DESCRIPTION: After obtaining full informed written consent, the patient was brought back to the vascular suited. The patient's left neck and chest were sterilely prepped and draped in a sterile surgical field was created. Moderate conscious sedation was administered during a face to face encounter with the patient throughout the procedure with my supervision of the RN administering medicines and monitoring the patient's vital signs, pulse oximetry, telemetry and mental status throughout from the start of the procedure until the patient was taken to the recovery room.  The left internal jugular vein was visualized with ultrasound and found to be patent. It was then accessed under direct ultrasound guidance and a permanent image was recorded. A  wire was placed. After skin nick and dilatation, the peel-away sheath was placed over the wire.  Initially, this had difficulty traversing the tortuosity from the left side and we used a short 6 [place] sheath and then an advantage wire and a Kumpe catheter to get down into the inferior vena cava through the atrium and give us  a good rail to place the PermCath over. I then turned my attention to an area under the clavicle. Approximately 1-2 fingerbreadths below the clavicle a small counterincision was created and tunneled from the subclavicular incision to the access site. Using fluoroscopic guidance, a 23 centimeter tip to cuff tunneled hemodialysis catheter was selected, and tunneled from the subclavicular incision to the access site. It was then placed through the peel-away sheath and the peel-away sheath was removed. Using fluoroscopic guidance the catheter tips were parked in the right atrium. The appropriate distal connectors were placed. It withdrew blood well and flushed easily with heparinized saline and a concentrated heparin  solution was then placed. It was secured to the chest wall with 2 Prolene sutures. The access incision was closed single 4-0 Monocryl. A 4-0 Monocryl pursestring suture was placed around the exit site. Sterile dressings were placed. The patient tolerated the procedure well and was taken to the recovery room in stable condition.  COMPLICATIONS: None  CONDITION: Stable  Selinda Gu  03/06/2024, 11:57 AM   This note was created with Dragon Medical transcription system. Any errors in dictation are purely unintentional.

## 2024-03-06 NOTE — Progress Notes (Signed)
 PROGRESS NOTE  Larry Morrison  FMW:[id number] DOB: Mar 24, 1971 DOA: 03/03/2024 PCP: [facility]   [name] is a 53 year old male with history of end-stage renal disease on hemodialysis MWF, history of noncompliance, frequent vascular access difficulty, hypertension, hyperlipidemia, hypothyroid, history of tobacco use, GERD, presents to the ED for chief concerns of fistula port access not working.  03/03/2024: Patient admitted to hospitalist service for vascular access, hyperkalemia, and continuation of hemodialysis.  03/04/2024: Potassium mildly improved to 5.5 from 6.2. At this time, vascular surgery has been aborted due to patient refusal.  Palliative care has been consulted and pending recommendation.  03/05/2024: Patient's legal guardian came to speak with him and he agreed for placement of HD catheter.   03/06/2024 on Lokelma  potassium remains elevated. Patient went placement of temporary HD catheter and had HD.  Assessment & Plan:   Principal Problem:   Dialysis AV fistula malfunction, initial encounter Active Problems:   ESRD on hemodialysis (HCC)   Hyperlipidemia   Tobacco use disorder   Essential hypertension   Hyperkalemia   Protein-calorie malnutrition, severe   Problem with dialysis access   Hypothyroidism   Palliative care by specialist   Hemodialysis graft malfunction   Assessment and Plan:  * Dialysis AV fistula malfunction Vascular surgery has been consulted, Dr. Marea is aware.    Underwent HD cath placement.  Will have procedure for AVF once potassium normalized.   ESRD on hemodialysis (HCC) AGMA Undergoing HD today.   Normocytic Anemia  Thrombocytopenia    Latest Ref Rng & Units 03/06/2024    2:18 PM 03/05/2024    4:30 AM 03/04/2024    6:38 AM  CBC  WBC 4.0 - 10.5 K/uL 5.1  5.9  5.2   Hemoglobin 13.0 - 17.0 g/dL 89.4  9.7  89.2   Hematocrit 39.0 - 52.0 % 31.8  29.4  33.8   Platelets 150 - 400 K/uL 128  127  142    Plt count  stable. Decreased. Hgb stable.  Check iron panel in the AM  Hyperlipidemia Atorvastatin  40 mg daily  Hypothyroidism Levothyroxine 50 mcg daily resumed  Hyperkalemia Potassium remains elevated despite multiple medications.  Plan for HD after temp cath placed   Essential hypertension Carvedilol  6.25 mg p.o. twice daily  Tobacco use disorder As needed nicotine  patch, Nicorette gum  DVT prophylaxis: Heparin  5000 units subcutaneous every 8 hours Code Status: Full code Family Communication: No, Legal guardian is [employer]  Disposition Plan: Pending clinical course, guarded prognosis Level of care: Telemetry Medical  Consultants:  Vascular, nephrology, palliative  Procedures:  Pending temporary dialysis catheter placement  Antimicrobials: None indicated at this time  Subjective: Patient had some nausea after HD   Objective: Vitals:   03/06/24 1600 03/06/24 1630 03/06/24 1742 03/06/24 1942  BP: (!) 164/94 (!) 176/97  (!) 186/97  Pulse: 64 64  70  Resp: (!) 21 (!) 21    Temp:  (!) 97.4 F (36.3 C)  97.6 F (36.4 C)  TempSrc:  Oral  Oral  SpO2: 100% 100%  98%  Weight:   52.4 kg   Height:       Intake/Output Summary (Last 24 hours) at 03/06/2024 2041 Last data filed at 03/06/2024 1858 Gross per 24 hour  Intake 50 ml  Output 600 ml  Net -550 ml   Filed Weights   03/03/24 1149 03/06/24 1325 03/06/24 1742  Weight: 59 kg 52.5 kg 52.4 kg   Examination:  Constitutional: In no distress. thin  Cardiovascular: Normal rate, regular rhythm. No lower extremity edema  Pulmonary: Non labored breathing on room air, no wheezing or rales.   Abdominal: Soft. Non distended and non tender. Suprapubic catheter with clear urine.  Musculoskeletal: Normal range of motion.     Neurological: Alert and oriented to person, place, and time. Non focal  Skin: Skin is warm and dry.   Data Reviewed: I have personally reviewed following labs and imaging studies  CBC: Recent  Labs  Lab 03/03/24 1233 03/04/24 0638 03/05/24 0430 03/06/24 1418  WBC 5.4 5.2 5.9 5.1  HGB 11.9* 10.7* 9.7* 10.5*  HCT 36.4* 33.8* 29.4* 31.8*  MCV 96.8 96.8 95.5 94.9  PLT 154 142* 127* 128*   Basic Metabolic Panel: Recent Labs  Lab 03/03/24 1233 03/03/24 1426 03/03/24 1559 03/03/24 1952 03/04/24 0003 03/04/24 0638 03/04/24 1557 03/05/24 0430 03/05/24 1101 03/06/24 0532  NA 138  --  137  --   --   --   --  139 140 140  K 6.2*   < > 5.3* 5.4*   < > 5.5* 5.3* 5.6* 6.0* 5.4*  CL 99  --  96*  --   --   --   --  100 102 102  CO2 24  --  26  --   --   --   --  19* 19* 21*  GLUCOSE 78  --  62*  --   --   --   --  82 80 77  BUN 81*  --  85*  --   --   --   --  91* 96* 124*  CREATININE 11.76*  --  11.41*  --   --   --   --  14.35* 14.23* 15.60*  CALCIUM  8.4*  --  10.2  --   --   --   --  8.0* 7.8* 8.2*  MG 2.2  --   --   --   --   --   --   --   --   --   PHOS 4.9*  --  4.4 4.4  --   --   --   --   --   --    < > = values in this interval not displayed.   GFR: Estimated Creatinine Clearance: 4 mL/min (A) (by C-G formula based on SCr of 15.6 mg/dL (H)).  Liver Function Tests: Recent Labs  Lab 03/03/24 1559  ALBUMIN  3.1*   CBG: Recent Labs  Lab 03/03/24 1518 03/03/24 1921 03/05/24 0811  GLUCAP 96 111* 78   Scheduled Meds:  aspirin  EC  81 mg Oral Daily   atorvastatin   40 mg Oral Daily   calcium  acetate  1,334 mg Oral TID WC   carvedilol   6.25 mg Oral BID   Chlorhexidine  Gluconate Cloth  6 each Topical Q0600   heparin   5,000 Units Subcutaneous Q8H   levothyroxine  50 mcg Oral Q0600   multivitamin  1 tablet Oral QHS   pantoprazole   40 mg Oral Daily   patiromer   16.8 g Oral Daily   sodium zirconium cyclosilicate   10 g Oral Daily    LOS: 3 days   Time spent:35 minutes  Alban Pepper, MD  [facility] If 7PM-7AM, please contact night-coverage 03/06/2024, 8:41 PM

## 2024-03-06 NOTE — Progress Notes (Signed)
 [facility]  ROUNDING NOTE   Subjective:   Larry Morrison is a 53 year old African-American male with past medical history including pulmonary hypertension, combined CHF, CVA, hypertension, neurogenic bladder with right nephrostomy tube, and end-stage renal disease on hemodialysis patient presents to the emergency department for evaluation of dialysis access and has been admitted for Hyperkalemia [E87.5] End stage renal failure on dialysis [facility]) [N18.6, Z99.2] Hemodialysis graft malfunction [T82.41XA] Problem with dialysis access, initial encounter [T82.898A]   Patient is known to our practice and receives outpatient dialysis treatments at [facility] on a MWF schedule, supervised by Dr. Douglas.  Last treatment received on Friday.    Patient seen laying in bed Alert, guardian at bedside Room air Agreeable to HD temp cath placement today  Objective:  Vital signs in last 24 hours:  Temp:  [97.6 F (36.4 C)-98.3 F (36.8 C)] 97.7 F (36.5 C) (10/16 1101) Pulse Rate:  [58-67] 63 (10/16 1115) Resp:  [9-25] 9 (10/16 1125) BP: (135-189)/(83-120) 180/92 (10/16 1115) SpO2:  [96 %-100 %] 100 % (10/16 1125)  Weight change:  Filed Weights   03/03/24 1149  Weight: 59 kg    Intake/Output: I/O last 3 completed shifts: In: 240 [P.O.:240] Out: -    Intake/Output this shift:  No intake/output data recorded.  Physical Exam: General: NAD  Head: Normocephalic, atraumatic. Moist oral mucosal membranes  Eyes: Anicteric  Lungs:  Clear to auscultation, normal effort  Heart: Regular rate and rhythm  Abdomen:  Soft, nontender  Extremities: No peripheral edema.  Neurologic: Awake, alert, conversant  Skin: Warm,dry, no rash  Access: Left upper aVF    Basic Metabolic Panel: Recent Labs  Lab 03/03/24 1233 03/03/24 1426 03/03/24 1559 03/03/24 1952 03/04/24 0003 03/04/24 0638 03/04/24 1557 03/05/24 0430 03/05/24 1101 03/06/24 0532  NA 138  --  137  --   --   --    --  139 140 140  K 6.2*   < > 5.3* 5.4*   < > 5.5* 5.3* 5.6* 6.0* 5.4*  CL 99  --  96*  --   --   --   --  100 102 102  CO2 24  --  26  --   --   --   --  19* 19* 21*  GLUCOSE 78  --  62*  --   --   --   --  82 80 77  BUN 81*  --  85*  --   --   --   --  91* 96* 124*  CREATININE 11.76*  --  11.41*  --   --   --   --  14.35* 14.23* 15.60*  CALCIUM  8.4*  --  10.2  --   --   --   --  8.0* 7.8* 8.2*  MG 2.2  --   --   --   --   --   --   --   --   --   PHOS 4.9*  --  4.4 4.4  --   --   --   --   --   --    < > = values in this interval not displayed.    Liver Function Tests: Recent Labs  Lab 03/03/24 1559  ALBUMIN  3.1*   No results for input(s): LIPASE, AMYLASE in the last 168 hours. No results for input(s): AMMONIA in the last 168 hours.  CBC: Recent Labs  Lab 03/03/24 1233 03/04/24 0638 03/05/24 0430  WBC 5.4  5.2 5.9  HGB 11.9* 10.7* 9.7*  HCT 36.4* 33.8* 29.4*  MCV 96.8 96.8 95.5  PLT 154 142* 127*    Cardiac Enzymes: No results for input(s): CKTOTAL, CKMB, CKMBINDEX, TROPONINI in the last 168 hours.  BNP: Invalid input(s): POCBNP  CBG: Recent Labs  Lab 03/03/24 1518 03/03/24 1921 03/05/24 0811  GLUCAP 96 111* 78    Microbiology: Results for orders placed or performed during the hospital encounter of 09/19/23  Urine Culture     Status: None   Collection Time: 09/19/23 10:51 AM   Specimen: Urine, Clean Catch  Result Value Ref Range Status   Specimen Description   Final    URINE, CLEAN CATCH Performed at [facility], [address] [place], [place] [address]    Special Requests   Final    NONE Performed at [facility], [address] [place], [place] [address]    Culture   Final    NO GROWTH Performed at [facility], [address] [place], [place] [address]    Report Status 09/21/2023 FINAL  Final    Coagulation Studies: No results for input(s): LABPROT, INR in the last 72  hours.  Urinalysis: No results for input(s): COLORURINE, LABSPEC, PHURINE, GLUCOSEU, HGBUR, BILIRUBINUR, KETONESUR, PROTEINUR, UROBILINOGEN, NITRITE, LEUKOCYTESUR in the last 72 hours.  Invalid input(s): APPERANCEUR    Imaging: No results found.    Medications:    sodium chloride       ceFAZolin  (ANCEF ) IV 1 g (03/06/24 1125)    [MAR Hold] aspirin  EC  81 mg Oral Daily   [MAR Hold] atorvastatin   40 mg Oral Daily   [MAR Hold] calcium  acetate  1,334 mg Oral TID WC   [MAR Hold] carvedilol   6.25 mg Oral BID   [MAR Hold] Chlorhexidine  Gluconate Cloth  6 each Topical Q0600   [MAR Hold] heparin   5,000 Units Subcutaneous Q8H   [MAR Hold] levothyroxine  50 mcg Oral Q0600   [MAR Hold] multivitamin  1 tablet Oral QHS   [MAR Hold] pantoprazole   40 mg Oral Daily   [MAR Hold] patiromer   16.8 g Oral Daily   [MAR Hold] sodium zirconium cyclosilicate   10 g Oral Daily   [MAR Hold] albuterol , ceFAZolin  (ANCEF ) IV, [MAR Hold] dextrose , diphenhydrAMINE , famotidine , [MAR Hold] fentaNYL  (SUBLIMAZE ) injection, fentaNYL , methylPREDNISolone  (SOLU-MEDROL ) injection, midazolam , midazolam  PF, [MAR Hold] nicotine  **OR** [MAR Hold] nicotine  polacrilex  Assessment/ Plan:  [name] is a 53 y.o.  male  with past medical history including pulmonary hypertension, combined CHF, CVA, hypertension, neurogenic bladder with right nephrostomy tube, and end-stage renal disease on hemodialysis   [facility]/MWF/Rt Permcath   End-stage renal disease with hyperkalemia on hemodialysis.  Last treatment completed on Friday.  Medical management of hyperkalemia with Lokelma  and Veltassa . BUN elevated. Awaiting HD temp cath placement to provide dialysis treatment.   2.  Malfunctioning dialysis access, left upper arm aVF without bruit or thrill.  Vascular surgery consulted and following.  Patient now agreeable to HD temp cath, will be placed today. Will schedule fistulogram per vascular.    3. Anemia of chronic kidney disease Lab Results  Component Value Date   HGB 9.7 (L) 03/05/2024   Hgb stable  4. Hypertension with chronic kidney disease. Home regimen includes carvedilol  and irbesartan .  Carvedilol  continued.  Blood pressure stable this morning.    LOS: 3 Cambreigh Dearing 10/16/202511:30 AM

## 2024-03-06 NOTE — Interval H&P Note (Signed)
 History and Physical Interval Note:  03/06/2024 10:58 AM  Larry Morrison  has presented today for surgery, with the diagnosis of ESRD.  The various methods of treatment have been discussed with the patient and family. After consideration of risks, benefits and other options for treatment, the patient has consented to PermCath placement.  His potassium is still too high to perform thrombectomy of the AV graft, but it is at least now in a range we can give him some degree of moderate sedation and place a dialysis access that should be durable.  He is still fairly adamant and avoiding any kind of temporary catheter or groin access and I think this will be the most reasonable option to get him an access that he can actually go home with.  Consideration can still be given in the future for attempting to open his AV graft once his potassium is better, but at least with a PermCath we will have a viable dialysis access option going forward even at discharge.  The patient's history has been reviewed, patient examined, no change in status, stable for surgery.  I have reviewed the patient's chart and labs.  Questions were answered to the patient's satisfaction.     Jianna Drabik

## 2024-03-06 NOTE — Progress Notes (Signed)
 Progress Note    03/06/2024 7:19 AM 3 Days Post-Op  Subjective:  Yusef Lamp is a 53 y.o. male past medical history significant for ESRD on HD MWF with left AV fistula, who presents to the emergency department with concern for dialysis failure (03/03/2024).  States that he went to dialysis today but they were unable to do dialysis due to difficulty accessing his last AV fistula.  States that he used to have a catheter but does not have anymore.  Denies any other complaints.  Last had dialysis on Friday (02/29/2024). Vascular Surgery was consulted to place a temporary dialysis access catheter due to patients chronic elevated potassium. Patient will receive moderate sedation only for this procedure.      Vitals:   03/05/24 1945 03/06/24 0500  BP: (!) 189/91 (!) 149/85  Pulse: 63 64  Resp: 16 16  Temp: 97.9 F (36.6 C) 98.3 F (36.8 C)  SpO2: 98% 98%   Physical Exam: Cardiac:  RRR, Normal S1,S2. No Murmurs noted  Lungs:  Clear on auscultation throughout Incisions:  None  Extremities:  Palpable pulses throughout with no thrill or bruit in A/V Graft  Abdomen:  Positive bowel sounds, soft and non tender.  Neurologic: AAOX1, HX of brain aneurysm   CBC    Component Value Date/Time   WBC 5.9 03/05/2024 0430   RBC 3.08 (L) 03/05/2024 0430   HGB 9.7 (L) 03/05/2024 0430   HGB 13.5 10/22/2013 1732   HCT 29.4 (L) 03/05/2024 0430   HCT 40.8 10/22/2013 1732   PLT 127 (L) 03/05/2024 0430   PLT 261 10/22/2013 1732   MCV 95.5 03/05/2024 0430   MCV 94 10/22/2013 1732   MCH 31.5 03/05/2024 0430   MCHC 33.0 03/05/2024 0430   RDW 13.7 03/05/2024 0430   RDW 13.4 10/22/2013 1732   LYMPHSABS 1.4 09/19/2023 1051   LYMPHSABS 1.2 07/17/2012 1219   MONOABS 0.5 09/19/2023 1051   MONOABS 1.2 (H) 07/17/2012 1219   EOSABS 0.3 09/19/2023 1051   EOSABS 0.0 07/17/2012 1219   BASOSABS 0.1 09/19/2023 1051   BASOSABS 0.1 07/17/2012 1219    BMET    Component Value Date/Time   NA 140 03/06/2024  0532   NA 138 10/22/2013 1732   K 5.4 (H) 03/06/2024 0532   K 4.7 10/22/2013 1732   CL 102 03/06/2024 0532   CL 112 (H) 10/22/2013 1732   CO2 21 (L) 03/06/2024 0532   CO2 20 (L) 10/22/2013 1732   GLUCOSE 77 03/06/2024 0532   GLUCOSE 68 10/22/2013 1732   BUN 124 (H) 03/06/2024 0532   BUN 74 (H) 10/22/2013 1732   CREATININE 15.60 (H) 03/06/2024 0532   CREATININE 5.22 (H) 10/22/2013 1732   CALCIUM  8.2 (L) 03/06/2024 0532   CALCIUM  9.3 10/22/2013 1732   GFRNONAA 3 (L) 03/06/2024 0532   GFRNONAA 12 (L) 10/22/2013 1732   GFRAA 9 (L) 11/20/2019 1407   GFRAA 14 (L) 10/22/2013 1732    INR    Component Value Date/Time   INR 1.3 (H) 06/30/2021 0907   INR 1.2 07/17/2012 1219     Intake/Output Summary (Last 24 hours) at 03/06/2024 0719 Last data filed at 03/05/2024 1800 Gross per 24 hour  Intake 240 ml  Output --  Net 240 ml     Assessment/Plan:  53 y.o. male presents to [facility] emergency room for non functioning A/V graft. Patient was offered last week on arrival a temporary dialysis catheter access to be placed due to elevated Potassium levels. Patient  has strongly refused this catheter placement without general anesthesia. Patients potassium level placed him a great risk with the use of general anesthesia therefor he would not allow any catheter access.  3 Days Post-Op   PLAN Patients DSS Legal Guardian spoke to the patient and he will allow us  today to place a temporary catheter to his right groin using moderate sedation only. Therefore we will proceed with temporary dialysis catheter placement today. I spoke to the patient this morning and informed him of the risks, benefits and complications. He verbalized his understanding and said he will proceed. Patient does not need to be NPO for the procedure.   DVT prophylaxis:  Heparin  5000 units SQ Q 8hrs.    Gwendlyn JONELLE [facility]ank [facility] 03/06/2024 7:19 AM

## 2024-03-06 NOTE — Plan of Care (Signed)
                                                     Palliative Care Progress Note   Patient Name: Larry Morrison       Date: 03/06/2024 DOB: [birth year]  Age: 53 y.o. MRN#: [id number] Attending Physician: Franchot Novel, MD Primary Care Physician: [facility] Admit Date: 03/03/2024  Extensive chart review completed including labs, vital signs, imaging, progress notes, orders, and available documents from current and previous encounters.   After the reviewing the chart, I attempted to visit with the patient at bedside.  He is off the unit for placement of temporary catheter.  No acute palliative needs at this time.    PMT will continue to follow and support patient throughout this hospitalization.  Thank you for allowing the Palliative Medicine Team to assist in the care of Larry Morrison.  [name], DNP, FNP-BC Palliative Medicine Team    No charge

## 2024-03-06 NOTE — Progress Notes (Signed)
 1. Appointment canceled by hospital    Patient currently hospitalized.

## 2024-03-07 DIAGNOSIS — T82590D Other mechanical complication of surgically created arteriovenous fistula, subsequent encounter: Secondary | ICD-10-CM | POA: Diagnosis not present

## 2024-03-07 DIAGNOSIS — N186 End stage renal disease: Secondary | ICD-10-CM | POA: Diagnosis not present

## 2024-03-07 DIAGNOSIS — Z515 Encounter for palliative care: Secondary | ICD-10-CM | POA: Diagnosis not present

## 2024-03-07 DIAGNOSIS — T82898A Other specified complication of vascular prosthetic devices, implants and grafts, initial encounter: Secondary | ICD-10-CM | POA: Diagnosis not present

## 2024-03-07 DIAGNOSIS — T82590A Other mechanical complication of surgically created arteriovenous fistula, initial encounter: Secondary | ICD-10-CM | POA: Diagnosis not present

## 2024-03-07 LAB — IRON AND TIBC
Iron: 129 ug/dL (ref 45–182)
Saturation Ratios: 74 % — ABNORMAL HIGH (ref 17.9–39.5)
TIBC: 175 ug/dL — ABNORMAL LOW (ref 250–450)
UIBC: 46 ug/dL

## 2024-03-07 LAB — RENAL FUNCTION PANEL
Albumin: 2.7 g/dL — ABNORMAL LOW (ref 3.5–5.0)
Anion gap: 12 (ref 5–15)
BUN: 63 mg/dL — ABNORMAL HIGH (ref 6–20)
CO2: 25 mmol/L (ref 22–32)
Calcium: 7.9 mg/dL — ABNORMAL LOW (ref 8.9–10.3)
Chloride: 100 mmol/L (ref 98–111)
Creatinine, Ser: 9.74 mg/dL — ABNORMAL HIGH (ref 0.61–1.24)
GFR, Estimated: 6 mL/min — ABNORMAL LOW (ref >60)
Glucose, Bld: 87 mg/dL (ref 70–99)
Phosphorus: 6.2 mg/dL — ABNORMAL HIGH (ref 2.5–4.6)
Potassium: 4.2 mmol/L (ref 3.5–5.1)
Sodium: 137 mmol/L (ref 135–145)

## 2024-03-07 LAB — CBC
HCT: 30.4 % — ABNORMAL LOW (ref 39.0–52.0)
Hemoglobin: 10.1 g/dL — ABNORMAL LOW (ref 13.0–17.0)
MCH: 31.6 pg (ref 26.0–34.0)
MCHC: 33.2 g/dL (ref 30.0–36.0)
MCV: 95 fL (ref 80.0–100.0)
Platelets: 115 K/uL — ABNORMAL LOW (ref 150–400)
RBC: 3.2 MIL/uL — ABNORMAL LOW (ref 4.22–5.81)
RDW: 13.3 % (ref 11.5–15.5)
WBC: 5.8 K/uL (ref 4.0–10.5)
nRBC: 0 % (ref 0.0–0.2)

## 2024-03-07 LAB — FOLATE: Folate: >20 ng/mL (ref >5.9)

## 2024-03-07 LAB — VITAMIN B12: Vitamin B-12: 674 pg/mL (ref 180–914)

## 2024-03-07 LAB — FERRITIN: Ferritin: 712 ng/mL — ABNORMAL HIGH (ref 24–336)

## 2024-03-07 MED ORDER — IRBESARTAN 150 MG PO TABS: 75.0000 mg | ORAL_TABLET | Freq: Every evening | ORAL | Status: DC

## 2024-03-07 MED ORDER — ONDANSETRON HCL 4 MG/2ML IJ SOLN
INTRAMUSCULAR | Status: AC
  Filled 2024-03-07: qty 2

## 2024-03-07 MED ORDER — HEPARIN SODIUM (PORCINE) 1000 UNIT/ML IJ SOLN
INTRAMUSCULAR | Status: AC
  Filled 2024-03-07: qty 5

## 2024-03-07 MED ORDER — HYDROMORPHONE HCL 2 MG PO TABS: 1.0000 mg | ORAL_TABLET | Freq: Four times a day (QID) | ORAL | Status: DC | PRN

## 2024-03-07 MED ORDER — HYDROMORPHONE HCL 1 MG/ML IJ SOLN
0.5000 mg | Freq: Once | INTRAMUSCULAR | Status: AC
  Administered 2024-03-07: 0.5 mg via INTRAVENOUS

## 2024-03-07 MED ORDER — HYDROMORPHONE HCL 1 MG/ML IJ SOLN
INTRAMUSCULAR | Status: AC
  Filled 2024-03-07: qty 1

## 2024-03-07 MED ORDER — ACETAMINOPHEN 325 MG PO TABS
650.0000 mg | ORAL_TABLET | Freq: Four times a day (QID) | ORAL | Status: DC | PRN
  Administered 2024-03-08: 650 mg via ORAL
  Filled 2024-03-07: qty 2

## 2024-03-07 MED ORDER — HYDROMORPHONE HCL 1 MG/ML IJ SOLN
0.5000 mg | INTRAMUSCULAR | Status: DC | PRN
  Administered 2024-03-07 – 2024-03-08 (×2): 0.5 mg via INTRAVENOUS
  Filled 2024-03-07 (×2): qty 0.5

## 2024-03-07 NOTE — Progress Notes (Signed)
 Progress Note    03/07/2024 7:26 AM 1 Day Post-Op  Subjective:  Larry Morrison is a 53 yo male now POD #1 from:  PROCEDURE: Ultrasound guidance for vascular access to the left internal jugular vein Fluoroscopic guidance for placement of catheter Placement of a 23 cm tip to cuff tunneled hemodialysis catheter via the left internal jugular vein  Patient resting comfortably in bed this morning. Endorses soreness to left chest at the site. This is expected. Recovering as expected. No complaints overnight and vitals all remain stable.     Vitals:   03/07/24 0355 03/07/24 0641  BP: (!) 151/82   Pulse: 66   Resp:  20  Temp: 98.6 F (37 C)   SpO2: 99%    Physical Exam: Cardiac:  RRR, Normal S1,S2. No Murmurs noted  Lungs:  Clear on auscultation throughout Incisions: Left Chest with placement of dialysis catheter. Dressing clean dry and intact  Extremities:  Palpable pulses throughout with no thrill or bruit in A/V Graft  Abdomen:  Positive bowel sounds, soft and non tender.  Neurologic: AAOX1, HX of brain aneurysm   CBC    Component Value Date/Time   WBC 5.1 03/06/2024 1418   RBC 3.35 (L) 03/06/2024 1418   HGB 10.5 (L) 03/06/2024 1418   HGB 13.5 10/22/2013 1732   HCT 31.8 (L) 03/06/2024 1418   HCT 40.8 10/22/2013 1732   PLT 128 (L) 03/06/2024 1418   PLT 261 10/22/2013 1732   MCV 94.9 03/06/2024 1418   MCV 94 10/22/2013 1732   MCH 31.3 03/06/2024 1418   MCHC 33.0 03/06/2024 1418   RDW 13.2 03/06/2024 1418   RDW 13.4 10/22/2013 1732   LYMPHSABS 1.4 09/19/2023 1051   LYMPHSABS 1.2 07/17/2012 1219   MONOABS 0.5 09/19/2023 1051   MONOABS 1.2 (H) 07/17/2012 1219   EOSABS 0.3 09/19/2023 1051   EOSABS 0.0 07/17/2012 1219   BASOSABS 0.1 09/19/2023 1051   BASOSABS 0.1 07/17/2012 1219    BMET    Component Value Date/Time   NA 137 03/07/2024 0507   NA 138 10/22/2013 1732   K 4.2 03/07/2024 0507   K 4.7 10/22/2013 1732   CL 100 03/07/2024 0507   CL 112 (H) 10/22/2013  1732   CO2 25 03/07/2024 0507   CO2 20 (L) 10/22/2013 1732   GLUCOSE 87 03/07/2024 0507   GLUCOSE 68 10/22/2013 1732   BUN 63 (H) 03/07/2024 0507   BUN 74 (H) 10/22/2013 1732   CREATININE 9.74 (H) 03/07/2024 0507   CREATININE 5.22 (H) 10/22/2013 1732   CALCIUM  7.9 (L) 03/07/2024 0507   CALCIUM  9.3 10/22/2013 1732   GFRNONAA 6 (L) 03/07/2024 0507   GFRNONAA 12 (L) 10/22/2013 1732   GFRAA 9 (L) 11/20/2019 1407   GFRAA 14 (L) 10/22/2013 1732    INR    Component Value Date/Time   INR 1.3 (H) 06/30/2021 0907   INR 1.2 07/17/2012 1219     Intake/Output Summary (Last 24 hours) at 03/07/2024 0726 Last data filed at 03/06/2024 1858 Gross per 24 hour  Intake 50 ml  Output 600 ml  Net -550 ml     Assessment/Plan:  53 y.o. male is s/p SEE ABOVE 1 Day Post-Op   PLAN  Okay per vascular surgery to use Dialysis Perma Catheter for hemodialysis. Okay to discharge to home today. Patient will follow up outpatient for Declot of fistula of left upper extremity.   DVT prophylaxis:  ASA 81 mg Daily and Heparin  5000 units SQ every 8  hours.    Gwendlyn JONELLE [facility] 03/07/2024 7:26 AM

## 2024-03-07 NOTE — Progress Notes (Signed)
 PROGRESS NOTE  Larry Morrison  FMW:[id number] DOB: [birth year] DOA: 03/03/2024 PCP: [facility]   [name] is a 53 year old male with history of end-stage renal disease on hemodialysis MWF, history of noncompliance, frequent vascular access difficulty, hypertension, hyperlipidemia, hypothyroid, history of tobacco use, GERD, presents to the ED for chief concerns of fistula port access not working.  03/03/2024: Patient admitted to hospitalist service for vascular access, hyperkalemia, and continuation of hemodialysis.  03/04/2024: Potassium mildly improved to 5.5 from 6.2. At this time, vascular surgery has been aborted due to patient refusal.  Palliative care has been consulted and pending recommendation.  03/05/2024: Patient's legal guardian came to speak with him and he agreed for placement of HD catheter.   03/06/2024 on Lokelma  potassium remains elevated. Patient went placement of temporary HD catheter and had HD.  Assessment & Plan:   Principal Problem:   Dialysis AV fistula malfunction, initial encounter Active Problems:   ESRD on hemodialysis (HCC)   Hyperlipidemia   Tobacco use disorder   Essential hypertension   Hyperkalemia   Protein-calorie malnutrition, severe   Problem with dialysis access   Hypothyroidism   Palliative care by specialist   Hemodialysis graft malfunction   Assessment and Plan:  * Dialysis AV fistula malfunction Vascular surgery has been consulted, Dr. Marea is aware.    Underwent HD cath placement.  Procedure for AVF will be deferred to outpatient   ESRD on hemodialysis (HCC) AGMA Resolved Undergoing HD today.   Normocytic Anemia  Thrombocytopenia    Latest Ref Rng & Units 03/07/2024    8:24 AM 03/06/2024    2:18 PM 03/05/2024    4:30 AM  CBC  WBC 4.0 - 10.5 K/uL 5.8  5.1  5.9   Hemoglobin 13.0 - 17.0 g/dL 89.8  89.4  9.7   Hematocrit 39.0 - 52.0 % 30.4  31.8  29.4   Platelets 150 - 400 K/uL 115  128  127    Plt count  stable. Decreased. Hgb stable.  No iron deficiency.  Be 12 normal.  Folate normal Likely in the setting of stage renal disease, ESA per nephrology  Hyperlipidemia Atorvastatin  40 mg daily  Hypothyroidism Levothyroxine 50 mcg daily resumed  Hyperkalemia Potassium remains elevated despite multiple medications.  Plan for HD after temp cath placed   Essential hypertension Elevated, continue carvedilol  6.25 mg p.o. twice daily, resume home irbesartan   Tobacco use disorder As needed nicotine  patch, Nicorette gum  DVT prophylaxis: Heparin  5000 units subcutaneous every 8 hours Code Status: Full code Family Communication: No, Legal guardian is [employer]  Disposition Plan: Pending clinical course, guarded prognosis Level of care: Telemetry Medical  Consultants:  Vascular, nephrology, palliative  Procedures:  Pending temporary dialysis catheter placement  Antimicrobials: None indicated at this time  Subjective: Patient had some nausea after HD this AM   Objective: Vitals:   03/07/24 1204 03/07/24 1214 03/07/24 1237 03/07/24 1711  BP: (!) 178/95 (!) 159/92 (!) 157/94 (!) 139/90  Pulse: (!) 51 (!) 56 (!) 56 71  Resp: (!) 21 19 16 16   Temp: 98.5 F (36.9 C)  97.8 F (36.6 C) 98.9 F (37.2 C)  TempSrc:   Oral Oral  SpO2: 100% 99% 98% 99%  Weight: 50.3 kg 49.3 kg    Height:       Intake/Output Summary (Last 24 hours) at 03/07/2024 1923 Last data filed at 03/07/2024 1912 Gross per 24 hour  Intake --  Output 1600 ml  Net -1600 ml  Filed Weights   03/07/24 0804 03/07/24 1204 03/07/24 1214  Weight: 51.3 kg 50.3 kg 49.3 kg   Examination:  Physical Exam  Constitutional: In no distress.  Thin Cardiovascular: Normal rate, regular rhythm. No lower extremity edema  Pulmonary: Non labored breathing on room air, no wheezing or rales.   Abdominal: Soft. Non distended and non tender Musculoskeletal: Normal range of motion.     Neurological: Alert and oriented  to person, place, and time. Non focal  Skin: Skin is warm and dry.    Data Reviewed: I have personally reviewed following labs and imaging studies  CBC: Recent Labs  Lab 03/03/24 1233 03/04/24 0638 03/05/24 0430 03/06/24 1418 03/07/24 0824  WBC 5.4 5.2 5.9 5.1 5.8  HGB 11.9* 10.7* 9.7* 10.5* 10.1*  HCT 36.4* 33.8* 29.4* 31.8* 30.4*  MCV 96.8 96.8 95.5 94.9 95.0  PLT 154 142* 127* 128* 115*   Basic Metabolic Panel: Recent Labs  Lab 03/03/24 1233 03/03/24 1426 03/03/24 1559 03/03/24 1952 03/04/24 0003 03/04/24 1557 03/05/24 0430 03/05/24 1101 03/06/24 0532 03/07/24 0507  NA 138  --  137  --   --   --  139 140 140 137  K 6.2*   < > 5.3* 5.4*   < > 5.3* 5.6* 6.0* 5.4* 4.2  CL 99  --  96*  --   --   --  100 102 102 100  CO2 24  --  26  --   --   --  19* 19* 21* 25  GLUCOSE 78  --  62*  --   --   --  82 80 77 87  BUN 81*  --  85*  --   --   --  91* 96* 124* 63*  CREATININE 11.76*  --  11.41*  --   --   --  14.35* 14.23* 15.60* 9.74*  CALCIUM  8.4*  --  10.2  --   --   --  8.0* 7.8* 8.2* 7.9*  MG 2.2  --   --   --   --   --   --   --   --   --   PHOS 4.9*  --  4.4 4.4  --   --   --   --   --  6.2*   < > = values in this interval not displayed.   GFR: Estimated Creatinine Clearance: 6 mL/min (A) (by C-G formula based on SCr of 9.74 mg/dL (H)).  Liver Function Tests: Recent Labs  Lab 03/03/24 1559 03/07/24 0507  ALBUMIN  3.1* 2.7*   CBG: Recent Labs  Lab 03/03/24 1518 03/03/24 1921 03/05/24 0811  GLUCAP 96 111* 78   Scheduled Meds:  aspirin  EC  81 mg Oral Daily   atorvastatin   40 mg Oral Daily   calcium  acetate  1,334 mg Oral TID WC   carvedilol   6.25 mg Oral BID   Chlorhexidine  Gluconate Cloth  6 each Topical Q0600   heparin   5,000 Units Subcutaneous Q8H   levothyroxine  50 mcg Oral Q0600   multivitamin  1 tablet Oral QHS   pantoprazole   40 mg Oral Daily    LOS: 4 days   Time spent:35 minutes  Alban Pepper, MD  [facility] If 7PM-7AM,  please contact night-coverage 03/07/2024, 7:23 PM

## 2024-03-07 NOTE — Progress Notes (Signed)
 Palliative Care Progress Note, Assessment & Plan   Patient Name: Larry Morrison       Date: 03/07/2024 DOB: 1971/03/30  Age: 53 y.o. MRN#: [id number] Attending Physician: Franchot Novel, MD Primary Care Physician: [facility] Admit Date: 03/03/2024  Subjective: Patient is lying in bed, resting, in bay two of hemodialysis suite.  He easily awakens to my presence.  Once awake, he is alert and oriented x 4, able to make his wishes known.  No family or friends present during my visit.  HPI: 53 y.o. male  with past medical history of  ESRD (HD on MWF, patient known to miss sessions), learning disability, aneurysm with craniotomy, CVA (LE residual weakness), pulmonary HTN, combined CHF, wheelchair-bound, neurogenic bladder with right nephrostomy tube, severe protein malnutrition, anemia of chronic disease, tobacco abuse, history of TBI, PEA arrest (03/2021) and known medication noncompliance admitted on 03/03/2024 with dialysis access issue and hyperkalemia.   Patient being treated for hyperkalemia and need for vascular access for continuation of hemodialysis.   PMT was consulted to support patient with goals of care discussions.   Of note, patient is familiar to this provider as I met with patient during his November 2022 hospitalization.   Summary of counseling/coordination of care: Extensive chart review completed prior to meeting patient including labs, vital signs, imaging, progress notes, orders, and available advanced directive documents from current and previous encounters.   After reviewing the patient's chart and assessing the patient at bedside, I spoke with patient in regards to symptom management and goals of care.   Symptoms assessed.  Patient endorses hunger.  He denies physical  issues such as headache, chest pain, N/B/D, or other acute issues at this time.  No adjustment to [facility] needed.  Reviewed that patient is receiving hemodialysis without complication.  He shares he is ready to go home and is asking when he will be able to eat.  Discussed barriers to discharge include need for dialysis completion today as well as plan for outpatient follow-up for fistulogram.  Reminded patient that attending Dr. Franchot since March has went manage patient is medically cleared and stable to return home.  He endorsed understanding and continued to ask if he would be able to eat soon.  No change to plan of care at this time.  Full code and full scope remain.  Symptom burden is low.  Goals are clear.  PMT will step back from daily visits and monitor the patient peripherally.  Please re-engage with PMT if goals change, at patient/family's request, or if patient's health deteriorates during hospitalization.    Physical Exam Vitals reviewed.  Constitutional:      General: He is not in acute distress.    Appearance: He is normal weight.  HENT:     Head: Normocephalic.     Mouth/Throat:     Mouth: Mucous membranes are moist.     Comments: Poor dentition Cardiovascular:     Pulses: Normal pulses.  Pulmonary:     Effort: Pulmonary effort is normal.  Abdominal:     Palpations: Abdomen is soft.  Skin:    General: Skin is warm and dry.  Neurological:     Mental Status: He is  alert and oriented to person, place, and time.  Psychiatric:        Mood and Affect: Mood normal.        Behavior: Behavior normal.             Visit includes: Detailed review of medical records (labs, imaging, vital signs), medically appropriate exam (mental status, respiratory, cardiac, skin), discussed with treatment team, counseling and educating patient, family and staff, documenting clinical information, medication management and coordination of care.  [name], DNP, FNP-BC Palliative Medicine  Team

## 2024-03-07 NOTE — Progress Notes (Signed)
 [facility]  ROUNDING NOTE   Subjective:   Larry Morrison is a 53 year old African-American male with past medical history including pulmonary hypertension, combined CHF, CVA, hypertension, neurogenic bladder with right nephrostomy tube, and end-stage renal disease on hemodialysis patient presents to the emergency department for evaluation of dialysis access and has been admitted for Hyperkalemia [E87.5] End stage renal failure on dialysis [facility]) [N18.6, Z99.2] Hemodialysis graft malfunction [T82.41XA] Problem with dialysis access, initial encounter [T82.898A]   Patient is known to our practice and receives outpatient dialysis treatments at [facility] on a MWF schedule, supervised by Dr. Douglas.  Last treatment received on Friday.    Patient seen and evaluated during dialysis   HEMODIALYSIS FLOWSHEET:  Blood Flow Rate (mL/min): 399 mL/min Arterial Pressure (mmHg): -212.71 mmHg Venous Pressure (mmHg): 194.54 mmHg TMP (mmHg): 0.8 mmHg Ultrafiltration Rate (mL/min): 543 mL/min Dialysate Flow Rate (mL/min): 299 ml/min  States he got sick on his stomach, emesis bag in lap Feels better now Feels like he got sick due to not eating this morning  Objective:  Vital signs in last 24 hours:  Temp:  [96.4 F (35.8 C)-98.6 F (37 C)] 97.9 F (36.6 C) (10/17 0804) Pulse Rate:  [0-70] 52 (10/17 0900) Resp:  [9-25] 12 (10/17 0900) BP: (139-186)/(82-106) 139/89 (10/17 0900) SpO2:  [88 %-100 %] 100 % (10/17 0900) Weight:  [51.3 kg-52.5 kg] 51.3 kg (10/17 0804)  Weight change:  Filed Weights   03/06/24 1325 03/06/24 1742 03/07/24 0804  Weight: 52.5 kg 52.4 kg 51.3 kg    Intake/Output: I/O last 3 completed shifts: In: 50 [IV Piggyback:50] Out: 600 [Other:600]   Intake/Output this shift:  No intake/output data recorded.  Physical Exam: General: NAD  Head: Normocephalic, atraumatic. Moist oral mucosal membranes  Eyes: Anicteric  Lungs:  Clear to auscultation,  normal effort  Heart: Regular rate and rhythm  Abdomen:  Soft, nontender  Extremities: No peripheral edema.  Neurologic: Awake, alert, conversant  Skin: Warm,dry, no rash  Access: Left upper aVF (no bruit/thrill), Lt Permcath    Basic Metabolic Panel: Recent Labs  Lab 03/03/24 1233 03/03/24 1426 03/03/24 1559 03/03/24 1952 03/04/24 0003 03/04/24 1557 03/05/24 0430 03/05/24 1101 03/06/24 0532 03/07/24 0507  NA 138  --  137  --   --   --  139 140 140 137  K 6.2*   < > 5.3* 5.4*   < > 5.3* 5.6* 6.0* 5.4* 4.2  CL 99  --  96*  --   --   --  100 102 102 100  CO2 24  --  26  --   --   --  19* 19* 21* 25  GLUCOSE 78  --  62*  --   --   --  82 80 77 87  BUN 81*  --  85*  --   --   --  91* 96* 124* 63*  CREATININE 11.76*  --  11.41*  --   --   --  14.35* 14.23* 15.60* 9.74*  CALCIUM  8.4*  --  10.2  --   --   --  8.0* 7.8* 8.2* 7.9*  MG 2.2  --   --   --   --   --   --   --   --   --   PHOS 4.9*  --  4.4 4.4  --   --   --   --   --  6.2*   < > = values in this  interval not displayed.    Liver Function Tests: Recent Labs  Lab 03/03/24 1559 03/07/24 0507  ALBUMIN  3.1* 2.7*   No results for input(s): LIPASE, AMYLASE in the last 168 hours. No results for input(s): AMMONIA in the last 168 hours.  CBC: Recent Labs  Lab 03/03/24 1233 03/04/24 0638 03/05/24 0430 03/06/24 1418 03/07/24 0824  WBC 5.4 5.2 5.9 5.1 5.8  HGB 11.9* 10.7* 9.7* 10.5* 10.1*  HCT 36.4* 33.8* 29.4* 31.8* 30.4*  MCV 96.8 96.8 95.5 94.9 95.0  PLT 154 142* 127* 128* 115*    Cardiac Enzymes: No results for input(s): CKTOTAL, CKMB, CKMBINDEX, TROPONINI in the last 168 hours.  BNP: Invalid input(s): POCBNP  CBG: Recent Labs  Lab 03/03/24 1518 03/03/24 1921 03/05/24 0811  GLUCAP 96 111* 78    Microbiology: Results for orders placed or performed during the hospital encounter of 09/19/23  Urine Culture     Status: None   Collection Time: 09/19/23 10:51 AM   Specimen: Urine,  Clean Catch  Result Value Ref Range Status   Specimen Description   Final    URINE, CLEAN CATCH Performed at [facility], [address] [place], [place] [address]    Special Requests   Final    NONE Performed at [facility], [address], [place], [place] [address]    Culture   Final    NO GROWTH Performed at [facility], [address] [place], [place] [address]    Report Status 09/21/2023 FINAL  Final    Coagulation Studies: No results for input(s): LABPROT, INR in the last 72 hours.  Urinalysis: No results for input(s): COLORURINE, LABSPEC, PHURINE, GLUCOSEU, HGBUR, BILIRUBINUR, KETONESUR, PROTEINUR, UROBILINOGEN, NITRITE, LEUKOCYTESUR in the last 72 hours.  Invalid input(s): APPERANCEUR    Imaging: PERIPHERAL VASCULAR CATHETERIZATION Result Date: 03/06/2024 See surgical note for result.     Medications:      aspirin  EC  81 mg Oral Daily   atorvastatin   40 mg Oral Daily   calcium  acetate  1,334 mg Oral TID WC   carvedilol   6.25 mg Oral BID   Chlorhexidine  Gluconate Cloth  6 each Topical Q0600   heparin   5,000 Units Subcutaneous Q8H   levothyroxine  50 mcg Oral Q0600   multivitamin  1 tablet Oral QHS   pantoprazole   40 mg Oral Daily   patiromer   16.8 g Oral Daily   sodium zirconium cyclosilicate   10 g Oral Daily   acetaminophen , albuterol , dextrose , fentaNYL  (SUBLIMAZE ) injection, nicotine  **OR** nicotine  polacrilex, ondansetron  (ZOFRAN ) IV  Assessment/ Plan:  [name] is a 53 y.o.  male  with past medical history including pulmonary hypertension, combined CHF, CVA, hypertension, neurogenic bladder with right nephrostomy tube, and end-stage renal disease on hemodialysis   [facility]/MWF/Rt Permcath   End-stage renal disease with hyperkalemia on hemodialysis.  Patient received Lt Permcath yesterday with dialysis afterwards. Receiving dialysis today to maintain outpatient schedule.  Next treatment scheduled for Monday  2.  Malfunctioning dialysis access, left upper arm aVF without bruit or thrill.  Vascular surgery consulted and placed Lt Permcath on 10/16. Will follow up outpatient for fistulagram.   3. Anemia of chronic kidney disease Lab Results  Component Value Date   HGB 10.1 (L) 03/07/2024   Hgb remains within desired range.  4. Hypertension with chronic kidney disease. Home regimen includes carvedilol  and irbesartan .  Carvedilol  continued.  Blood pressure 132/98 during dialysis   LOS: 4 Ahmaya Ostermiller 10/17/20259:42 AM

## 2024-03-07 NOTE — Procedures (Signed)
 Received patient in bed.  Alert and oriented.  Informed consent signed and in chart.   TX duration:3hrs  Patient tolerated well.   Alert, without acute distress.  Hand-off given to patient's nurse.   Access used: Left upper arm AVF. Access issues: NONE  Total UF removed: 2.5L Medication(s) given: NONE    [facility]

## 2024-03-07 NOTE — Progress Notes (Addendum)
 Hemodialysis Note:  Received patient in bed to unit. Alert and oriented. Informed consent singed and in chart.  Treatment initiated: 0822 Treatment completed: 1200  Access used: Left internal jugular catheter Access issues: None  Patient tolerated well. Transported back to room, alert without acute distress. Report given to patient's RN.  Total UF removed: 1 liter Medications given: Dilaudid  0.5 mg IV, Zofran  4mg  IV  Post HD weight: 49.3 kg  [facility]

## 2024-03-08 DIAGNOSIS — T82590A Other mechanical complication of surgically created arteriovenous fistula, initial encounter: Secondary | ICD-10-CM | POA: Diagnosis not present

## 2024-03-08 LAB — BASIC METABOLIC PANEL WITH GFR
Anion gap: 14 (ref 5–15)
BUN: 40 mg/dL — ABNORMAL HIGH (ref 6–20)
CO2: 27 mmol/L (ref 22–32)
Calcium: 8 mg/dL — ABNORMAL LOW (ref 8.9–10.3)
Chloride: 95 mmol/L — ABNORMAL LOW (ref 98–111)
Creatinine, Ser: 6.54 mg/dL — ABNORMAL HIGH (ref 0.61–1.24)
GFR, Estimated: 9 mL/min — ABNORMAL LOW (ref >60)
Glucose, Bld: 82 mg/dL (ref 70–99)
Potassium: 4.2 mmol/L (ref 3.5–5.1)
Sodium: 136 mmol/L (ref 135–145)

## 2024-03-08 NOTE — Discharge Summary (Signed)
 Physician Discharge Summary   Patient: Coleman Kalas MRN: [id number] DOB: [birth year]  Admit date:     03/03/2024  Discharge date: 03/08/24  Discharge Physician: Alban Pepper   PCP: [facility]   Recommendations at discharge:    Need f/u outpatient vascular surgery AV fistula declot  F/u CBC to ensure plt count stable   Discharge Diagnoses: Principal Problem:   Dialysis AV fistula malfunction, initial encounter Active Problems:   ESRD on hemodialysis (HCC)   Hyperlipidemia   Tobacco use disorder   Essential hypertension   Hyperkalemia   Protein-calorie malnutrition, severe   Problem with dialysis access   Hypothyroidism   Palliative care by specialist   Hemodialysis graft malfunction  Resolved Problems:   ESRD (end stage renal disease) [facility])  Hospital Course: Mr. Armanii Pressnell is a 53 year old male with history of end-stage renal disease on hemodialysis MWF, history of noncompliance, frequent vascular access difficulty, hypertension, hyperlipidemia, hypothyroid, history of tobacco use, GERD, presents to the ED for chief concerns of fistula port access not working.  03/03/2024: Patient admitted to hospitalist service for vascular access, hyperkalemia, and continuation of hemodialysis.  03/04/2024: Potassium mildly improved to 5.5 from 6.2. At this time, vascular surgery has been aborted due to patient refusal.  Palliative care has been consulted and pending recommendation.   03/05/2024: Patient's legal guardian came to speak with him and he agreed for placement of HD catheter.    03/06/2024 on Lokelma  potassium remains elevated. Patient went placement of temporary HD catheter and had HD. His metabolic derangements resolved with hemodialysis.    Assessment and Plan: Dialysis AV fistula malfunction Vascular surgery has been consulted, Dr. Marea is aware.     Underwent HD cath placement.  Procedure for AVF will be deferred to outpatien  ESRD on  hemodialysis (HCC) AGMA resolved HD via temp HD cath   Normocytic anemia Thrombocytopenia     Latest Ref Rng & Units 03/07/2024    8:24 AM 03/06/2024    2:18 PM 03/05/2024    4:30 AM  CBC  WBC 4.0 - 10.5 K/uL 5.8  5.1  5.9   Hemoglobin 13.0 - 17.0 g/dL 89.8  89.4  9.7   Hematocrit 39.0 - 52.0 % 30.4  31.8  29.4   Platelets 150 - 400 K/uL 115  128  127    No iron deficiency.  Be 12 normal.  Folate normal Likely in the setting of stage renal disease, ESA per nephrology  Hyperlipidemia Atorvastatin  40 mg daily  Hypothyroidism Levothyroxine 50 mcg daily resumed  Hyperkalemia In the setting of not having HD. Resolved with HD.   Essential hypertension Carvedilol  6.25 mg p.o. twice daily. Also added his irbesartan  back on.      03/08/2024    7:56 AM 03/08/2024    4:55 AM 03/07/2024   11:17 PM  Vitals with BMI  Systolic 135 135 842  Diastolic 78 82 89  Pulse 73 80 72     Tobacco use disorder As needed nicotine  patch, Nicorette gum         Consultants: Vasc, palliative, nephrology  Procedures performed: Temp HD cathter placed   Disposition: Group home Diet recommendation:  Discharge Diet Orders (From admission, onward)     Start     Ordered   03/08/24 0000  Diet renal with fluid restriction        03/08/24 0959           Regular diet DISCHARGE MEDICATION: Allergies as of 03/08/2024   No  Known Allergies      Medication List     STOP taking these medications    oxyCODONE  5 MG immediate release tablet Commonly known as: Roxicodone    pseudoephedrine 30 MG tablet Commonly known as: SUDAFED   sulfamethoxazole -trimethoprim  400-80 MG tablet Commonly known as: BACTRIM        TAKE these medications    acetaminophen  325 MG tablet Commonly known as: TYLENOL  Take 2 tablets (650 mg total) by mouth every 6 (six) hours as needed for mild pain, fever or moderate pain.   albuterol  108 (90 Base) MCG/ACT inhaler Commonly known as: VENTOLIN   HFA Inhale 1-2 puffs into the lungs every 4 (four) hours as needed for wheezing or shortness of breath.   aspirin  EC 81 MG tablet Take 1 tablet (81 mg total) by mouth daily. Swallow whole.   atorvastatin  40 MG tablet Commonly known as: LIPITOR Take 1 tablet (40 mg total) by mouth daily.   calcium  acetate 667 MG capsule Commonly known as: PHOSLO  Take 2 capsules (1,334 mg total) by mouth 3 (three) times daily with meals.   carvedilol  6.25 MG tablet Commonly known as: COREG  Take 6.25 mg by mouth 2 (two) times daily.   feeding supplement Liqd Take 237 mLs by mouth 2 (two) times daily between meals.   irbesartan  75 MG tablet Commonly known as: AVAPRO  Take 2 tablets (150 mg total) by mouth every evening. What changed: how much to take   levothyroxine 50 MCG tablet Commonly known as: SYNTHROID Take 50 mcg by mouth daily.   lidocaine -prilocaine  cream Commonly known as: EMLA  Apply 1 Application topically 3 (three) times a week. Apply to dialysis site Mon, Wed, Fri   multivitamin Tabs tablet Take 1 tablet by mouth at bedtime.   ondansetron  4 MG disintegrating tablet Commonly known as: ZOFRAN -ODT Take 1 tablet (4 mg total) by mouth every 6 (six) hours as needed for nausea or vomiting.   pantoprazole  40 MG tablet Commonly known as: PROTONIX  Take 40 mg by mouth daily.   Tegaderm Film 6x8 Misc Apply 1 each topically.        Follow-up Information     [facility]. Schedule an appointment as soon as possible for a visit in 1 week(s).   Contact information: [address] [place] [address] [phone]                Discharge Exam: Filed Weights   03/07/24 0804 03/07/24 1204 03/07/24 1214  Weight: 51.3 kg 50.3 kg 49.3 kg   Physical Exam  Constitutional: In no distress. Thin Cardiovascular: Normal rate, regular rhythm. No lower extremity edema  Chest: L chest with tunneled cath, at insertion no surrounding erythema of skin Pulmonary:  Non labored breathing on room air, no wheezing or rales.   Abdominal: Soft. Non distended and non tender Musculoskeletal: Normal range of motion.     Neurological: Alert and oriented to person, place, and time. Non focal  Skin: Skin is warm and dry.    Condition at discharge: stable  The results of significant diagnostics from this hospitalization (including imaging, microbiology, ancillary and laboratory) are listed below for reference.   Imaging Studies: PERIPHERAL VASCULAR CATHETERIZATION Result Date: 03/06/2024 See surgical note for result.  ABORTED INVASIVE LAB PROCEDURE Result Date: 03/04/2024 See surgical note for result.   Microbiology: Results for orders placed or performed during the hospital encounter of 09/19/23  Urine Culture     Status: None   Collection Time: 09/19/23 10:51 AM   Specimen: Urine,  Clean Catch  Result Value Ref Range Status   Specimen Description   Final    URINE, CLEAN CATCH Performed at [facility], [address], [place], [place] [address]    Special Requests   Final    NONE Performed at [facility], [address] [place], [place] [address]    Culture   Final    NO GROWTH Performed at [facility], [address] [place], [place] [address]    Report Status 09/21/2023 FINAL  Final    Labs: CBC: Recent Labs  Lab 03/03/24 1233 03/04/24 0638 03/05/24 0430 03/06/24 1418 03/07/24 0824  WBC 5.4 5.2 5.9 5.1 5.8  HGB 11.9* 10.7* 9.7* 10.5* 10.1*  HCT 36.4* 33.8* 29.4* 31.8* 30.4*  MCV 96.8 96.8 95.5 94.9 95.0  PLT 154 142* 127* 128* 115*   Basic Metabolic Panel: Recent Labs  Lab 03/03/24 1233 03/03/24 1426 03/03/24 1559 03/03/24 1952 03/04/24 0003 03/05/24 0430 03/05/24 1101 03/06/24 0532 03/07/24 0507 03/08/24 0358  NA 138  --  137  --   --  139 140 140 137 136  K 6.2*   < > 5.3* 5.4*   < > 5.6* 6.0* 5.4* 4.2 4.2  CL 99  --  96*  --   --  100 102 102 100 95*  CO2 24  --  26  --   --  19*  19* 21* 25 27  GLUCOSE 78  --  62*  --   --  82 80 77 87 82  BUN 81*  --  85*  --   --  91* 96* 124* 63* 40*  CREATININE 11.76*  --  11.41*  --   --  14.35* 14.23* 15.60* 9.74* 6.54*  CALCIUM  8.4*  --  10.2  --   --  8.0* 7.8* 8.2* 7.9* 8.0*  MG 2.2  --   --   --   --   --   --   --   --   --   PHOS 4.9*  --  4.4 4.4  --   --   --   --  6.2*  --    < > = values in this interval not displayed.   Liver Function Tests: Recent Labs  Lab 03/03/24 1559 03/07/24 0507  ALBUMIN  3.1* 2.7*   CBG: Recent Labs  Lab 03/03/24 1518 03/03/24 1921 03/05/24 0811  GLUCAP 96 111* 78    Discharge time spent: greater than 30 minutes.  Signed: Alban Pepper, MD [facility] 03/08/2024

## 2024-03-08 NOTE — Progress Notes (Signed)
Patient waiting for EMS transport.

## 2024-03-08 NOTE — Progress Notes (Signed)
 Called and spoke with Sari, DSS legal guardian. States it is fine to have patient return to facility. Planned for discharge today,

## 2024-03-08 NOTE — TOC Transition Note (Addendum)
 Transition of Care [facility]) - Discharge Note   Patient Details  Name: Larry Morrison MRN: [id number] Date of Birth: [birth year]  Transition of Care [facility]) CM/SW Contact:  Marinda Cooks, RN Phone Number: 03/08/2024, 2:46 PM   Clinical Narrative:    This CM updated by covering MD pt medically cleared to dc today and has  active DC order placed  . This CM spoke with Sari Kerns (DSS) Legal Guardian At [phone] and informed that medical team was going to dc pt today back to his ALF [facility]  on [address] she was in agreement . This CM also called and spoke with pt's ALF owner [name] [phone] and updated also.This CM asked about Ms. Lawanda providing DC transportation and was informed she is unable to provide today due to it being a weekend , this CM also asked pt's Legal Guardian Sari Kerns and she informed she was unable to provide dc transportation for pt today. This CM called to coordinate with [facility] and was informed due to pt's Medicaid insurance dc transportation would need to be coordinated through [facility] . This CM called [phone] and placed request for ambulance and was provided the reference # [id number] and informed someone from [facility] will contact [facility] and provide payor source info to coordinate ambulance. This CM called and updated pt's Legal Sherlynn Sari  and Ms. Lennette ALF owner of this info. This CM also informed pt would be picked up tentatively by 5pm. Also This CM provided [facility] dc transportation info and alerted him to look out for [facility] calling their company to coordinate DC transportation, he verbalized understanding and was provided floor number here at [facility] & pt's rm number to call once coordinated .  This CM also discussed HH recommendation with LG Sari and there was no preference referrals sent out awaiting a reply with a SOC date not definite if any will be secured due to pt's insurance type  ALF owner Lennette made aware  . No additional DC needs requested by medical team or identified by CM at this time .    15:10p- This CM updated by [facility] they have everything coordinated for the dc transportation   Final next level of care: Assisted Living [facility]  on [address]) Barriers to Discharge: No Barriers Identified, Transportation   Patient Goals and CMS Choice            Discharge Placement  Return to ALF               Patient to be transferred to facility by: Ambulance Name of family member notified: Sari Kerns (DSS)  Legal Guardian  Emergency Contact  [phone] Patient and family notified of of transfer: 03/08/24  Discharge Plan and Services Additional resources added to the After Visit Summary for                    DME Agency: NA                  Social Drivers of Health (SDOH) Interventions SDOH Screenings   Food Insecurity: No Food Insecurity (03/05/2024)  Housing: Low Risk  (03/05/2024)  Transportation Needs: No Transportation Needs (03/05/2024)  Utilities: Not At Risk (03/05/2024)  Social Connections: Socially Isolated (03/05/2024)  Tobacco Use: High Risk (03/03/2024)     Readmission Risk Interventions    08/30/2021   12:02 PM  Readmission Risk Prevention  Plan  Transportation Screening Complete  Medication Review [facility]) Complete  PCP or Specialist appointment within 3-5 days of discharge Complete  HRI or Home Care Consult Complete  SW Recovery Care/Counseling Consult Complete  Palliative Care Screening Not Applicable  Skilled Nursing Facility Not Applicable

## 2024-03-08 NOTE — Progress Notes (Signed)
 [facility]  PROGRESS NOTE   Subjective:   Patient feels much better.  Anxious to go home today.  Objective:  Vital signs: Blood pressure 137/83, pulse 71, temperature 98.6 F (37 C), resp. rate 16, height 5' 4 (1.626 m), weight 49.3 kg, SpO2 98%.  Intake/Output Summary (Last 24 hours) at 03/08/2024 1638 Last data filed at 03/08/2024 1100 Gross per 24 hour  Intake 0 ml  Output 600 ml  Net -600 ml   Filed Weights   03/07/24 0804 03/07/24 1204 03/07/24 1214  Weight: 51.3 kg 50.3 kg 49.3 kg     Physical Exam: General:  No acute distress  Head:  Normocephalic, atraumatic. Moist oral mucosal membranes  Eyes:  Anicteric  Neck:  Supple  Lungs:   Clear to auscultation, normal effort  Heart:  S1S2 no rubs  Abdomen:   Soft, nontender, bowel sounds present  Extremities:  peripheral edema.  Neurologic:  Awake, alert, following commands  Skin:  No lesions  Access:     Basic Metabolic Panel: Recent Labs  Lab 03/03/24 1233 03/03/24 1426 03/03/24 1559 03/03/24 1952 03/04/24 0003 03/05/24 0430 03/05/24 1101 03/06/24 0532 03/07/24 0507 03/08/24 0358  NA 138  --  137  --   --  139 140 140 137 136  K 6.2*   < > 5.3* 5.4*   < > 5.6* 6.0* 5.4* 4.2 4.2  CL 99  --  96*  --   --  100 102 102 100 95*  CO2 24  --  26  --   --  19* 19* 21* 25 27  GLUCOSE 78  --  62*  --   --  82 80 77 87 82  BUN 81*  --  85*  --   --  91* 96* 124* 63* 40*  CREATININE 11.76*  --  11.41*  --   --  14.35* 14.23* 15.60* 9.74* 6.54*  CALCIUM  8.4*  --  10.2  --   --  8.0* 7.8* 8.2* 7.9* 8.0*  MG 2.2  --   --   --   --   --   --   --   --   --   PHOS 4.9*  --  4.4 4.4  --   --   --   --  6.2*  --    < > = values in this interval not displayed.   GFR: Estimated Creatinine Clearance: 8.9 mL/min (A) (by C-G formula based on SCr of 6.54 mg/dL (H)).  Liver Function Tests: Recent Labs  Lab 03/03/24 1559 03/07/24 0507  ALBUMIN  3.1* 2.7*   No results for input(s): LIPASE, AMYLASE in  the last 168 hours. No results for input(s): AMMONIA in the last 168 hours.  CBC: Recent Labs  Lab 03/03/24 1233 03/04/24 0638 03/05/24 0430 03/06/24 1418 03/07/24 0824  WBC 5.4 5.2 5.9 5.1 5.8  HGB 11.9* 10.7* 9.7* 10.5* 10.1*  HCT 36.4* 33.8* 29.4* 31.8* 30.4*  MCV 96.8 96.8 95.5 94.9 95.0  PLT 154 142* 127* 128* 115*     HbA1C: Hgb A1c MFr Bld  Date/Time Value Ref Range Status  09/23/2021 09:05 PM 5.3 4.8 - 5.6 % Final    Comment:    (NOTE) Pre diabetes:          5.7%-6.4%  Diabetes:              >6.4%  Glycemic control for   <7.0% adults with diabetes   06/30/2021 09:07 AM 5.0 4.8 -  5.6 % Final    Comment:    (NOTE) Pre diabetes:          5.7%-6.4%  Diabetes:              >6.4%  Glycemic control for   <7.0% adults with diabetes     Urinalysis: No results for input(s): COLORURINE, LABSPEC, PHURINE, GLUCOSEU, HGBUR, BILIRUBINUR, KETONESUR, PROTEINUR, UROBILINOGEN, NITRITE, LEUKOCYTESUR in the last 72 hours.  Invalid input(s): APPERANCEUR    Imaging: No results found.   Medications:     aspirin  EC  81 mg Oral Daily   atorvastatin   40 mg Oral Daily   calcium  acetate  1,334 mg Oral TID WC   carvedilol   6.25 mg Oral BID   Chlorhexidine  Gluconate Cloth  6 each Topical Q0600   heparin   5,000 Units Subcutaneous Q8H   irbesartan   75 mg Oral QPM   levothyroxine  50 mcg Oral Q0600   multivitamin  1 tablet Oral QHS   pantoprazole   40 mg Oral Daily    Assessment/ Plan:     53 year old male with history of end-stage renal disease on hemodialysis MWF, history of noncompliance, frequent vascular access difficulty, hypertension, hyperlipidemia, hypothyroid, history of tobacco use, GERD, presents to the ED for chief concerns of dialysis access not working.   #1: ESRD: Patient had stable dialysis yesterday.  He had a left permacath placement.  His next dialysis is on Monday. Patient had left arm AV fistula which need to be evaluated  by vascular as outpatient.  #2: Hypertension: Blood pressure is stable at this time.  Will continue the carvedilol .  #3: Anemia: Continue anemia protocols.  #4: Secondary hyperparathyroidism: Patient is advised to continue the calcium  acetate.  Discharge planning in progress. Labs and medications reviewed. Will continue to follow along with you.   LOS: 5 Pinkey Edman, MD [facility] 10/18/20254:38 PM

## 2024-03-08 NOTE — Plan of Care (Signed)
  Problem: Education: Goal: Knowledge of General Education information will improve Description: Including pain rating scale, medication(s)/side effects and non-pharmacologic comfort measures Outcome: Not Progressing   Problem: Health Behavior/Discharge Planning: Goal: Ability to manage health-related needs will improve Outcome: Not Progressing   Problem: Clinical Measurements: Goal: Ability to maintain clinical measurements within normal limits will improve Outcome: Progressing Goal: Will remain free from infection Outcome: Progressing Goal: Diagnostic test results will improve Outcome: Progressing Goal: Respiratory complications will improve Outcome: Progressing Goal: Cardiovascular complication will be avoided Outcome: Progressing   Problem: Activity: Goal: Risk for activity intolerance will decrease Outcome: Progressing   Problem: Nutrition: Goal: Adequate nutrition will be maintained Outcome: Progressing

## 2024-03-10 NOTE — Progress Notes (Signed)
 Late Note Entry- March 10, 2024  Pt was d/c on Saturday. Contacted [facility] this morning to advise staff of pt's d/c date and that pt should resume care today. Offered to fax d/c summary and last renal note. Navigator advised by staff that they can obtain needed clinicals.   Randine Mungo Dialysis Navigator (coverage) [phone]

## 2024-03-17 NOTE — Progress Notes (Signed)
 Patient for IR Nep Tube Exchange on Tues 03/18/24, I called and spoke with the patient's caretaker, Daphne on the phone and gave pre-procedure instructions. Daphne was made aware to have the patient here at 10:30a, NPO after MN prior to procedure as well as driver post procedure/recovery/discharge. Daphne stated understanding.Called 03/17/24

## 2024-03-18 ENCOUNTER — Ambulatory Visit
Admission: RE | Admit: 2024-03-18 | Discharge: 2024-03-18 | Disposition: A | Discharge status: Home / Self Care | Source: Ambulatory Visit | Attending: Interventional Radiology | Admitting: Interventional Radiology

## 2024-03-20 ENCOUNTER — Other Ambulatory Visit: Admitting: Radiology

## 2024-03-24 NOTE — Progress Notes (Signed)
 Patient for IR RT Neph Tube Exc with mod sed on Tues 03/25/24, I called and spoke with the patient's via, Brandi on the phone and gave pre-procedure instructions. Daphne was made aware to have the patient here at 11:30a, NPO after MN prior to procedure as well as driver post procedure/recovery/discharge. Daphne stated understanding.  Called 03/24/24

## 2024-03-25 ENCOUNTER — Other Ambulatory Visit: Payer: Self-pay

## 2024-03-25 ENCOUNTER — Ambulatory Visit
Admission: RE | Admit: 2024-03-25 | Discharge: 2024-03-25 | Disposition: A | Discharge status: Home / Self Care | Source: Ambulatory Visit | Attending: Interventional Radiology | Admitting: Interventional Radiology

## 2024-03-25 DIAGNOSIS — N139 Obstructive and reflux uropathy, unspecified: Secondary | ICD-10-CM | POA: Diagnosis not present

## 2024-03-25 DIAGNOSIS — Z992 Dependence on renal dialysis: Secondary | ICD-10-CM | POA: Diagnosis not present

## 2024-03-25 DIAGNOSIS — I5022 Chronic systolic (congestive) heart failure: Secondary | ICD-10-CM | POA: Diagnosis not present

## 2024-03-25 DIAGNOSIS — Z79899 Other long term (current) drug therapy: Secondary | ICD-10-CM | POA: Diagnosis not present

## 2024-03-25 DIAGNOSIS — Z436 Encounter for attention to other artificial openings of urinary tract: Secondary | ICD-10-CM | POA: Insufficient documentation

## 2024-03-25 DIAGNOSIS — I132 Hypertensive heart and chronic kidney disease with heart failure and with stage 5 chronic kidney disease, or end stage renal disease: Secondary | ICD-10-CM | POA: Diagnosis not present

## 2024-03-25 DIAGNOSIS — F17210 Nicotine dependence, cigarettes, uncomplicated: Secondary | ICD-10-CM | POA: Insufficient documentation

## 2024-03-25 DIAGNOSIS — N186 End stage renal disease: Secondary | ICD-10-CM | POA: Insufficient documentation

## 2024-03-25 HISTORY — PX: IR NEPHROSTOMY EXCHANGE RIGHT: IMG6070

## 2024-03-25 MED ORDER — FENTANYL CITRATE (PF) 100 MCG/2ML IJ SOLN
INTRAMUSCULAR | Status: AC | PRN
  Administered 2024-03-25: 50 ug via INTRAVENOUS

## 2024-03-25 MED ORDER — LIDOCAINE HCL 1 % IJ SOLN
INTRAMUSCULAR | Status: AC
  Filled 2024-03-25: qty 20

## 2024-03-25 MED ORDER — IOHEXOL 300 MG/ML  SOLN
10.0000 mL | Freq: Once | INTRAMUSCULAR | Status: AC | PRN
  Administered 2024-03-25: 4 mL

## 2024-03-25 MED ORDER — MIDAZOLAM HCL (PF) 2 MG/2ML IJ SOLN
INTRAMUSCULAR | Status: AC | PRN
  Administered 2024-03-25: 1 mg via INTRAVENOUS

## 2024-03-25 MED ORDER — SODIUM CHLORIDE 0.9 % IV SOLN: INTRAVENOUS | Status: DC

## 2024-03-25 MED ORDER — FENTANYL CITRATE (PF) 100 MCG/2ML IJ SOLN
INTRAMUSCULAR | Status: AC
  Filled 2024-03-25: qty 2

## 2024-03-25 MED ORDER — MIDAZOLAM HCL 2 MG/2ML IJ SOLN
INTRAMUSCULAR | Status: AC
  Filled 2024-03-25: qty 2

## 2024-03-25 MED ORDER — LIDOCAINE HCL 1 % IJ SOLN
10.0000 mL | Freq: Once | INTRAMUSCULAR | Status: AC
  Administered 2024-03-25: 2 mL via INTRADERMAL

## 2024-03-25 NOTE — Procedures (Signed)
 Interventional Radiology Procedure Note  Procedure: routine nephrostomy tube change  Complications: None  Estimated Blood Loss: < 10 mL  Findings: Right sided nephrostomy tube change.  Return in 8 weeks for routine exchange.  Cordella DELENA Banner, MD
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
## 2024-04-29 ENCOUNTER — Other Ambulatory Visit (INDEPENDENT_AMBULATORY_CARE_PROVIDER_SITE_OTHER)

## 2024-04-29 ENCOUNTER — Ambulatory Visit (INDEPENDENT_AMBULATORY_CARE_PROVIDER_SITE_OTHER): Admitting: Vascular Surgery

## 2024-04-29 ENCOUNTER — Encounter (INDEPENDENT_AMBULATORY_CARE_PROVIDER_SITE_OTHER): Payer: Self-pay | Admitting: Vascular Surgery

## 2024-04-29 VITALS — BP 124/71 | HR 57 | Resp 18

## 2024-04-29 DIAGNOSIS — Z992 Dependence on renal dialysis: Secondary | ICD-10-CM

## 2024-04-29 DIAGNOSIS — I10 Essential (primary) hypertension: Secondary | ICD-10-CM | POA: Diagnosis not present

## 2024-04-29 DIAGNOSIS — N186 End stage renal disease: Secondary | ICD-10-CM

## 2024-04-29 DIAGNOSIS — E782 Mixed hyperlipidemia: Secondary | ICD-10-CM

## 2024-04-29 DIAGNOSIS — T82590A Other mechanical complication of surgically created arteriovenous fistula, initial encounter: Secondary | ICD-10-CM
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
## 2024-04-29 NOTE — Assessment & Plan Note (Signed)
 blood pressure control important in reducing the progression of atherosclerotic disease. On appropriate oral medications.

## 2024-04-29 NOTE — Assessment & Plan Note (Signed)
 lipid control important in reducing the progression of atherosclerotic disease. Continue statin therapy

## 2024-05-01 ENCOUNTER — Telehealth (INDEPENDENT_AMBULATORY_CARE_PROVIDER_SITE_OTHER): Payer: Self-pay

## 2024-05-01 NOTE — Telephone Encounter (Signed)
 I attempted to contact the patient's legal guardian Zyannah to schedule the patient for a BUE venogram with Dr. Marea. A message was left for a return call.

## 2024-05-01 NOTE — Telephone Encounter (Signed)
 Patient's legal guardian called back and the patient is scheduled with Dr. Marea for BUE venogram on 05/19/24 with a 8:30 am arrival time to the [facility]. Pre-procedure instructions were discussed and will be faxed to attn Zyannah.

## 2024-05-19 ENCOUNTER — Ambulatory Visit: Admission: RE | Admit: 2024-05-19 | Admitting: Vascular Surgery

## 2024-05-19 ENCOUNTER — Encounter: Admission: RE | Payer: Self-pay

## 2024-05-19 SURGERY — UPPER EXTREMITY VENOGRAPHY
Anesthesia: Moderate Sedation | Laterality: Bilateral

## 2024-05-20 ENCOUNTER — Telehealth (INDEPENDENT_AMBULATORY_CARE_PROVIDER_SITE_OTHER): Payer: Self-pay

## 2024-05-20 ENCOUNTER — Emergency Department
Admission: EM | Admit: 2024-05-20 | Discharge: 2024-05-21 | Disposition: A | Discharge status: Skilled Nursing Facility | Attending: Emergency Medicine | Admitting: Emergency Medicine

## 2024-05-20 DIAGNOSIS — E039 Hypothyroidism, unspecified: Secondary | ICD-10-CM | POA: Diagnosis not present

## 2024-05-20 DIAGNOSIS — Z992 Dependence on renal dialysis: Secondary | ICD-10-CM | POA: Diagnosis not present

## 2024-05-20 DIAGNOSIS — D631 Anemia in chronic kidney disease: Secondary | ICD-10-CM | POA: Diagnosis not present

## 2024-05-20 DIAGNOSIS — N186 End stage renal disease: Secondary | ICD-10-CM | POA: Insufficient documentation

## 2024-05-20 DIAGNOSIS — R112 Nausea with vomiting, unspecified: Secondary | ICD-10-CM | POA: Diagnosis present

## 2024-05-20 DIAGNOSIS — K859 Acute pancreatitis without necrosis or infection, unspecified: Secondary | ICD-10-CM | POA: Insufficient documentation

## 2024-05-20 DIAGNOSIS — R7401 Elevation of levels of liver transaminase levels: Secondary | ICD-10-CM | POA: Diagnosis not present

## 2024-05-20 DIAGNOSIS — I120 Hypertensive chronic kidney disease with stage 5 chronic kidney disease or end stage renal disease: Secondary | ICD-10-CM | POA: Insufficient documentation

## 2024-05-20 LAB — COMPREHENSIVE METABOLIC PANEL WITH GFR
ALT: 155 U/L — ABNORMAL HIGH (ref 0–44)
AST: 120 U/L — ABNORMAL HIGH (ref 15–41)
Albumin: 3.4 g/dL — ABNORMAL LOW (ref 3.5–5.0)
Alkaline Phosphatase: 85 U/L (ref 38–126)
Anion gap: 17 — ABNORMAL HIGH (ref 5–15)
BUN: 53 mg/dL — ABNORMAL HIGH (ref 6–20)
CO2: 23 mmol/L (ref 22–32)
Calcium: 8.1 mg/dL — ABNORMAL LOW (ref 8.9–10.3)
Chloride: 97 mmol/L — ABNORMAL LOW (ref 98–111)
Creatinine, Ser: 10.9 mg/dL — ABNORMAL HIGH (ref 0.61–1.24)
GFR, Estimated: 5 mL/min — ABNORMAL LOW
Glucose, Bld: 92 mg/dL (ref 70–99)
Potassium: 4.5 mmol/L (ref 3.5–5.1)
Sodium: 137 mmol/L (ref 135–145)
Total Bilirubin: 0.5 mg/dL (ref 0.0–1.2)
Total Protein: 7.3 g/dL (ref 6.5–8.1)

## 2024-05-20 LAB — CBC
HCT: 28.3 % — ABNORMAL LOW (ref 39.0–52.0)
Hemoglobin: 9.3 g/dL — ABNORMAL LOW (ref 13.0–17.0)
MCH: 32.1 pg (ref 26.0–34.0)
MCHC: 32.9 g/dL (ref 30.0–36.0)
MCV: 97.6 fL (ref 80.0–100.0)
Platelets: 71 K/uL — ABNORMAL LOW (ref 150–400)
RBC: 2.9 MIL/uL — ABNORMAL LOW (ref 4.22–5.81)
RDW: 14.5 % (ref 11.5–15.5)
WBC: 5 K/uL (ref 4.0–10.5)
nRBC: 0 % (ref 0.0–0.2)

## 2024-05-20 LAB — LIPASE, BLOOD: Lipase: 124 U/L — ABNORMAL HIGH (ref 11–51)

## 2024-05-20 NOTE — ED Notes (Signed)
 Pt given saltine crackers and turkey sandwich.

## 2024-05-20 NOTE — Discharge Instructions (Addendum)
 Your blood work shows mild pancreatitis, which is inflammation in the pancreas.  We have seen this on your prior blood work although it is slightly worse now.  Your liver enzymes are also slightly elevated.  This should resolve on its own.  You should consume a bland diet for the next several days and avoid heavy or fatty foods.  Avoid any alcohol.  Follow-up with your primary care doctor to have your liver enzymes rechecked.  Return to the ER for new, worsening, or persistent severe nausea or vomiting, abdominal pain, weakness or lightheadedness, or any other new or worsening symptoms that concern you.

## 2024-05-20 NOTE — ED Triage Notes (Signed)
 BIB by [facility] from [facility]. C/O weakness since last night.    108/66

## 2024-05-20 NOTE — ED Notes (Signed)
 This tech and tech Jordan got the pt onto the bedpan. Pt had a bowel movement. Changed pt brief. +

## 2024-05-20 NOTE — ED Provider Notes (Addendum)
 "  [facility] Provider Note    Event Date/Time   First MD Initiated Contact with Patient 05/20/24 1536     (approximate)   History   Weakness   HPI  Larry Morrison is a 53 y.o. male with history of ESRD on HD, hypertension, and hypothyroidism who presents with nausea and vomiting.  The patient states that he woke up early this morning feeling nauseous and vomited several times.  He denies any associated abdominal pain or diarrhea.  He states that the symptoms have resolved and he is feeling fine right now.  He denies any acute symptoms at this time.  EMS reported generalized weakness that started last night, although the patient denies feeling weak at this time.  He is due for dialysis tomorrow.  I reviewed the past medical records.  The patient was admitted to the hospitalist service in October with a problem of her fistula port access not working as well as hyperkalemia.   Physical Exam   Triage Vital Signs: ED Triage Vitals [05/20/24 1320]  Encounter Vitals Group     BP 98/65     Girls Systolic BP Percentile      Girls Diastolic BP Percentile      Boys Systolic BP Percentile      Boys Diastolic BP Percentile      Pulse Rate 67     Resp 18     Temp 98.8 F (37.1 C)     Temp Source Oral     SpO2 95 %     Weight 125 lb (56.7 kg)     Height 5' 3 (1.6 m)     Head Circumference      Peak Flow      Pain Score 0     Pain Loc      Pain Education      Exclude from Growth Chart     Most recent vital signs: Vitals:   05/20/24 1700 05/20/24 1800  BP: 100/62 99/64  Pulse:  72  Resp:  19  Temp:  98.1 F (36.7 C)  SpO2:  97%     General: Alert, relatively well-appearing, no distress.  CV:  Good peripheral perfusion.  Resp:  Normal effort.  Lungs CTAB. Abd:  Soft and nontender.  No distention. Other:  No jaundice or scleral icterus.   ED Results / Procedures / Treatments   Labs (all labs ordered are listed, but only abnormal results are  displayed) Labs Reviewed  COMPREHENSIVE METABOLIC PANEL WITH GFR - Abnormal; Notable for the following components:      Result Value   Chloride 97 (*)    BUN 53 (*)    Creatinine, Ser 10.90 (*)    Calcium  8.1 (*)    Albumin  3.4 (*)    AST 120 (*)    ALT 155 (*)    GFR, Estimated 5 (*)    Anion gap 17 (*)    All other components within normal limits  CBC - Abnormal; Notable for the following components:   RBC 2.90 (*)    Hemoglobin 9.3 (*)    HCT 28.3 (*)    Platelets 71 (*)    All other components within normal limits  LIPASE, BLOOD - Abnormal; Notable for the following components:   Lipase 124 (*)    All other components within normal limits  URINALYSIS, ROUTINE W REFLEX MICROSCOPIC     EKG  ED ECG REPORT I, Waylon Cassis, the attending physician, personally viewed and  interpreted this ECG.  Date: 05/20/2024 EKG Time: 1329 Rate: 76 Rhythm: normal sinus rhythm QRS Axis: Right axis Intervals: Prolonged QTc ST/T Wave abnormalities: Nonspecific ST abnormality Narrative Interpretation: no evidence of acute ischemia    RADIOLOGY   PROCEDURES:  Critical Care performed: No  Procedures   MEDICATIONS ORDERED IN ED: Medications - No data to display   IMPRESSION / MDM / ASSESSMENT AND PLAN / ED COURSE  I reviewed the triage vital signs and the nursing notes.  53 year old male with PMH as noted above presents with nausea and vomiting earlier this morning that have now resolved.  EMS reported that he had been having some weakness since last night although he denies feeling weak, and is currently asymptomatic.  On exam his blood pressure is borderline although this may be the patient's baseline given that he has quite a small habitus.  Extremities appear well-perfused.  Abdomen is nontender.  Exam is otherwise unremarkable.  Differential diagnosis includes, but is not limited to, gastroenteritis, foodborne illness, gastroparesis, less likely pancreatitis or other  hepatobiliary cause.  Patient's presentation is most consistent with acute complicated illness / injury requiring diagnostic workup.  The patient is on the cardiac monitor to evaluate for evidence of arrhythmia and/or significant heart rate changes.  Lab workup is overall reassuring.  CMP shows no significant acute findings in this ER started the patient.  Anion gap is elevated which is chronic for him.  Electrolytes are unremarkable.  Potassium is normal.  However, the AST and ALT are slightly elevated.  Reviewing prior labs, the patient did have a similar slight elevation of the AST and ALT to 83 and 108 in January of this year.  His bilirubin and alkaline phosphatase are normal.  This could be a mild transaminitis due to a viral infection or other benign etiology.  At this time given that he is asymptomatic, there is no indication for further workup.  The CBC shows stable anemia as well as slightly worsened thrombocytopenia from baseline.  I have added on a lipase and we will attempt a p.o. challenge.  ----------------------------------------- 6:55 PM on 05/20/2024 -----------------------------------------  The lipase is also minimally elevated.  However, he has had an elevated lipase previously.  He is just slightly higher than 8 months ago.  There may be some component of mild pancreatitis.  However, the patient is tolerating p.o. without difficulty.  He has no persistent vomiting and has not had any abdominal pain.  Clinically there is no evidence of concerning acute pancreatitis.  The patient feels comfortable and would like to go home.  He is stable for discharge at this time.  I counseled him on the results of the workup and answered all his questions.  I gave strict return precautions, and he expressed understanding.   FINAL CLINICAL IMPRESSION(S) / ED DIAGNOSES   Final diagnoses:  Nausea and vomiting, unspecified vomiting type  Acute pancreatitis, unspecified complication status,  unspecified pancreatitis type     Rx / DC Orders   ED Discharge Orders     None        Note:  This document was prepared using Dragon voice recognition software and may include unintentional dictation errors.   Jacolyn Pae, MD 05/20/24 ZELPHA    Jacolyn Pae, MD 05/20/24 1857  "

## 2024-05-20 NOTE — ED Notes (Signed)
 Pt resting on stretcher. Has call bell within reach. POC pending [facility] transport

## 2024-05-20 NOTE — ED Triage Notes (Signed)
 See first nurse note. Pt here for generalized weakness that started last night. Pt is from [facility]. Pt A&Ox2

## 2024-05-20 NOTE — Telephone Encounter (Signed)
 Spoke with Larry Morrison the patient's caregiver and the patient has been rescheduled for a his BUE venogram with Dr. Marea to 05/29/24 with a 12:00 pm arrival time to the [facility]. Brandy wrote down instructions.

## 2024-05-28 ENCOUNTER — Emergency Department

## 2024-05-28 ENCOUNTER — Other Ambulatory Visit: Payer: Self-pay

## 2024-05-28 ENCOUNTER — Encounter: Payer: Self-pay | Admitting: Emergency Medicine

## 2024-05-28 ENCOUNTER — Inpatient Hospital Stay
Admission: EM | Admit: 2024-05-28 | Discharge: 2024-06-07 | DRG: 698 | Disposition: A | Discharge status: Skilled Nursing Facility | Source: Skilled Nursing Facility | Attending: Internal Medicine | Admitting: Internal Medicine

## 2024-05-28 DIAGNOSIS — Z66 Do not resuscitate: Secondary | ICD-10-CM | POA: Diagnosis present

## 2024-05-28 DIAGNOSIS — N99528 Other complication of other external stoma of urinary tract: Secondary | ICD-10-CM | POA: Diagnosis not present

## 2024-05-28 DIAGNOSIS — I5042 Chronic combined systolic (congestive) and diastolic (congestive) heart failure: Secondary | ICD-10-CM | POA: Diagnosis present

## 2024-05-28 DIAGNOSIS — Z905 Acquired absence of kidney: Secondary | ICD-10-CM

## 2024-05-28 DIAGNOSIS — B964 Proteus (mirabilis) (morganii) as the cause of diseases classified elsewhere: Secondary | ICD-10-CM | POA: Diagnosis present

## 2024-05-28 DIAGNOSIS — B9689 Other specified bacterial agents as the cause of diseases classified elsewhere: Secondary | ICD-10-CM | POA: Diagnosis present

## 2024-05-28 DIAGNOSIS — Z993 Dependence on wheelchair: Secondary | ICD-10-CM

## 2024-05-28 DIAGNOSIS — I4891 Unspecified atrial fibrillation: Secondary | ICD-10-CM | POA: Diagnosis not present

## 2024-05-28 DIAGNOSIS — R112 Nausea with vomiting, unspecified: Secondary | ICD-10-CM

## 2024-05-28 DIAGNOSIS — R7989 Other specified abnormal findings of blood chemistry: Principal | ICD-10-CM

## 2024-05-28 DIAGNOSIS — Z1152 Encounter for screening for COVID-19: Secondary | ICD-10-CM | POA: Diagnosis not present

## 2024-05-28 DIAGNOSIS — R531 Weakness: Secondary | ICD-10-CM | POA: Diagnosis not present

## 2024-05-28 DIAGNOSIS — I5040 Unspecified combined systolic (congestive) and diastolic (congestive) heart failure: Secondary | ICD-10-CM | POA: Diagnosis not present

## 2024-05-28 DIAGNOSIS — R5381 Other malaise: Secondary | ICD-10-CM | POA: Diagnosis present

## 2024-05-28 DIAGNOSIS — Z992 Dependence on renal dialysis: Secondary | ICD-10-CM

## 2024-05-28 DIAGNOSIS — R1310 Dysphagia, unspecified: Secondary | ICD-10-CM

## 2024-05-28 DIAGNOSIS — Z79899 Other long term (current) drug therapy: Secondary | ICD-10-CM

## 2024-05-28 DIAGNOSIS — R7401 Elevation of levels of liver transaminase levels: Secondary | ICD-10-CM | POA: Diagnosis present

## 2024-05-28 DIAGNOSIS — I132 Hypertensive heart and chronic kidney disease with heart failure and with stage 5 chronic kidney disease, or end stage renal disease: Secondary | ICD-10-CM | POA: Diagnosis present

## 2024-05-28 DIAGNOSIS — D539 Nutritional anemia, unspecified: Secondary | ICD-10-CM | POA: Diagnosis present

## 2024-05-28 DIAGNOSIS — E46 Unspecified protein-calorie malnutrition: Secondary | ICD-10-CM | POA: Diagnosis present

## 2024-05-28 DIAGNOSIS — J9601 Acute respiratory failure with hypoxia: Secondary | ICD-10-CM | POA: Diagnosis present

## 2024-05-28 DIAGNOSIS — N186 End stage renal disease: Secondary | ICD-10-CM | POA: Diagnosis present

## 2024-05-28 DIAGNOSIS — E872 Acidosis, unspecified: Secondary | ICD-10-CM | POA: Diagnosis present

## 2024-05-28 DIAGNOSIS — D649 Anemia, unspecified: Secondary | ICD-10-CM | POA: Diagnosis not present

## 2024-05-28 DIAGNOSIS — Z79890 Hormone replacement therapy (postmenopausal): Secondary | ICD-10-CM

## 2024-05-28 DIAGNOSIS — Z681 Body mass index (BMI) 19 or less, adult: Secondary | ICD-10-CM

## 2024-05-28 DIAGNOSIS — K859 Acute pancreatitis without necrosis or infection, unspecified: Secondary | ICD-10-CM | POA: Diagnosis present

## 2024-05-28 DIAGNOSIS — T83512A Infection and inflammatory reaction due to nephrostomy catheter, initial encounter: Principal | ICD-10-CM | POA: Diagnosis present

## 2024-05-28 DIAGNOSIS — I10 Essential (primary) hypertension: Secondary | ICD-10-CM | POA: Diagnosis present

## 2024-05-28 DIAGNOSIS — D631 Anemia in chronic kidney disease: Secondary | ICD-10-CM | POA: Diagnosis present

## 2024-05-28 DIAGNOSIS — I272 Pulmonary hypertension, unspecified: Secondary | ICD-10-CM | POA: Diagnosis present

## 2024-05-28 DIAGNOSIS — B952 Enterococcus as the cause of diseases classified elsewhere: Secondary | ICD-10-CM | POA: Diagnosis present

## 2024-05-28 DIAGNOSIS — Z87820 Personal history of traumatic brain injury: Secondary | ICD-10-CM

## 2024-05-28 DIAGNOSIS — R21 Rash and other nonspecific skin eruption: Secondary | ICD-10-CM | POA: Diagnosis present

## 2024-05-28 DIAGNOSIS — Z8616 Personal history of COVID-19: Secondary | ICD-10-CM

## 2024-05-28 DIAGNOSIS — Z7982 Long term (current) use of aspirin: Secondary | ICD-10-CM

## 2024-05-28 DIAGNOSIS — K920 Hematemesis: Secondary | ICD-10-CM | POA: Diagnosis present

## 2024-05-28 DIAGNOSIS — A419 Sepsis, unspecified organism: Secondary | ICD-10-CM | POA: Diagnosis present

## 2024-05-28 DIAGNOSIS — I959 Hypotension, unspecified: Secondary | ICD-10-CM

## 2024-05-28 DIAGNOSIS — R0602 Shortness of breath: Secondary | ICD-10-CM | POA: Diagnosis present

## 2024-05-28 DIAGNOSIS — E861 Hypovolemia: Secondary | ICD-10-CM | POA: Diagnosis present

## 2024-05-28 DIAGNOSIS — B9620 Unspecified Escherichia coli [E. coli] as the cause of diseases classified elsewhere: Secondary | ICD-10-CM | POA: Diagnosis not present

## 2024-05-28 DIAGNOSIS — Z91158 Patient's noncompliance with renal dialysis for other reason: Secondary | ICD-10-CM

## 2024-05-28 DIAGNOSIS — Z8673 Personal history of transient ischemic attack (TIA), and cerebral infarction without residual deficits: Secondary | ICD-10-CM

## 2024-05-28 DIAGNOSIS — F17210 Nicotine dependence, cigarettes, uncomplicated: Secondary | ICD-10-CM | POA: Diagnosis present

## 2024-05-28 DIAGNOSIS — I429 Cardiomyopathy, unspecified: Secondary | ICD-10-CM | POA: Diagnosis present

## 2024-05-28 DIAGNOSIS — N35819 Other urethral stricture, male, unspecified site: Secondary | ICD-10-CM | POA: Diagnosis present

## 2024-05-28 DIAGNOSIS — E785 Hyperlipidemia, unspecified: Secondary | ICD-10-CM | POA: Diagnosis present

## 2024-05-28 DIAGNOSIS — R9431 Abnormal electrocardiogram [ECG] [EKG]: Secondary | ICD-10-CM | POA: Diagnosis not present

## 2024-05-28 DIAGNOSIS — N319 Neuromuscular dysfunction of bladder, unspecified: Secondary | ICD-10-CM | POA: Diagnosis present

## 2024-05-28 DIAGNOSIS — I5022 Chronic systolic (congestive) heart failure: Secondary | ICD-10-CM | POA: Diagnosis not present

## 2024-05-28 DIAGNOSIS — Z87442 Personal history of urinary calculi: Secondary | ICD-10-CM

## 2024-05-28 DIAGNOSIS — E876 Hypokalemia: Secondary | ICD-10-CM | POA: Diagnosis present

## 2024-05-28 DIAGNOSIS — J90 Pleural effusion, not elsewhere classified: Secondary | ICD-10-CM | POA: Diagnosis not present

## 2024-05-28 DIAGNOSIS — K219 Gastro-esophageal reflux disease without esophagitis: Secondary | ICD-10-CM | POA: Diagnosis present

## 2024-05-28 DIAGNOSIS — I5020 Unspecified systolic (congestive) heart failure: Secondary | ICD-10-CM

## 2024-05-28 DIAGNOSIS — R053 Chronic cough: Secondary | ICD-10-CM | POA: Diagnosis present

## 2024-05-28 DIAGNOSIS — E039 Hypothyroidism, unspecified: Secondary | ICD-10-CM | POA: Diagnosis present

## 2024-05-28 DIAGNOSIS — Y833 Surgical operation with formation of external stoma as the cause of abnormal reaction of the patient, or of later complication, without mention of misadventure at the time of the procedure: Secondary | ICD-10-CM | POA: Diagnosis present

## 2024-05-28 DIAGNOSIS — N390 Urinary tract infection, site not specified: Secondary | ICD-10-CM | POA: Diagnosis present

## 2024-05-28 DIAGNOSIS — Z8249 Family history of ischemic heart disease and other diseases of the circulatory system: Secondary | ICD-10-CM

## 2024-05-28 DIAGNOSIS — A4151 Sepsis due to Escherichia coli [E. coli]: Secondary | ICD-10-CM | POA: Diagnosis present

## 2024-05-28 DIAGNOSIS — Z7189 Other specified counseling: Secondary | ICD-10-CM

## 2024-05-28 DIAGNOSIS — Z8674 Personal history of sudden cardiac arrest: Secondary | ICD-10-CM

## 2024-05-28 DIAGNOSIS — R6521 Severe sepsis with septic shock: Secondary | ICD-10-CM | POA: Diagnosis present

## 2024-05-28 DIAGNOSIS — E43 Unspecified severe protein-calorie malnutrition: Secondary | ICD-10-CM | POA: Diagnosis present

## 2024-05-28 DIAGNOSIS — Z789 Other specified health status: Secondary | ICD-10-CM

## 2024-05-28 LAB — CBC WITH DIFFERENTIAL/PLATELET
Abs Immature Granulocytes: 0.22 K/uL — ABNORMAL HIGH (ref 0.00–0.07)
Basophils Absolute: 0.1 K/uL (ref 0.0–0.1)
Basophils Relative: 1 %
Eosinophils Absolute: 0 K/uL (ref 0.0–0.5)
Eosinophils Relative: 0 %
HCT: 31.1 % — ABNORMAL LOW (ref 39.0–52.0)
Hemoglobin: 9.7 g/dL — ABNORMAL LOW (ref 13.0–17.0)
Immature Granulocytes: 1 %
Lymphocytes Relative: 8 %
Lymphs Abs: 1.3 K/uL (ref 0.7–4.0)
MCH: 31.9 pg (ref 26.0–34.0)
MCHC: 31.2 g/dL (ref 30.0–36.0)
MCV: 102.3 fL — ABNORMAL HIGH (ref 80.0–100.0)
Monocytes Absolute: 1.3 K/uL — ABNORMAL HIGH (ref 0.1–1.0)
Monocytes Relative: 8 %
Neutro Abs: 13.4 K/uL — ABNORMAL HIGH (ref 1.7–7.7)
Neutrophils Relative %: 82 %
Platelets: 229 K/uL (ref 150–400)
RBC: 3.04 MIL/uL — ABNORMAL LOW (ref 4.22–5.81)
RDW: 14.3 % (ref 11.5–15.5)
WBC: 16.3 K/uL — ABNORMAL HIGH (ref 4.0–10.5)
nRBC: 0 % (ref 0.0–0.2)

## 2024-05-28 LAB — HEPATITIS B SURFACE ANTIGEN: Hepatitis B Surface Ag: NONREACTIVE

## 2024-05-28 LAB — URINALYSIS, COMPLETE (UACMP) WITH MICROSCOPIC
Bacteria, UA: NONE SEEN
Bilirubin Urine: NEGATIVE
Glucose, UA: NEGATIVE mg/dL
Ketones, ur: NEGATIVE mg/dL
Nitrite: NEGATIVE
Protein, ur: 100 mg/dL — AB
Specific Gravity, Urine: 1.025 (ref 1.005–1.030)
Squamous Epithelial / HPF: 0 /HPF (ref 0–5)
pH: 7 (ref 5.0–8.0)

## 2024-05-28 LAB — RESP PANEL BY RT-PCR (RSV, FLU A&B, COVID)  RVPGX2
Influenza A by PCR: NEGATIVE
Influenza B by PCR: NEGATIVE
Resp Syncytial Virus by PCR: NEGATIVE
SARS Coronavirus 2 by RT PCR: NEGATIVE

## 2024-05-28 LAB — COMPREHENSIVE METABOLIC PANEL WITH GFR
ALT: 53 U/L — ABNORMAL HIGH (ref 0–44)
AST: 44 U/L — ABNORMAL HIGH (ref 15–41)
Albumin: 3.6 g/dL (ref 3.5–5.0)
Alkaline Phosphatase: 79 U/L (ref 38–126)
Anion gap: 19 — ABNORMAL HIGH (ref 5–15)
BUN: 51 mg/dL — ABNORMAL HIGH (ref 6–20)
CO2: 27 mmol/L (ref 22–32)
Calcium: 9.6 mg/dL (ref 8.9–10.3)
Chloride: 100 mmol/L (ref 98–111)
Creatinine, Ser: 11.3 mg/dL — ABNORMAL HIGH (ref 0.61–1.24)
GFR, Estimated: 5 mL/min — ABNORMAL LOW
Glucose, Bld: 93 mg/dL (ref 70–99)
Potassium: 4.5 mmol/L (ref 3.5–5.1)
Sodium: 146 mmol/L — ABNORMAL HIGH (ref 135–145)
Total Bilirubin: 1 mg/dL (ref 0.0–1.2)
Total Protein: 8.7 g/dL — ABNORMAL HIGH (ref 6.5–8.1)

## 2024-05-28 LAB — RESPIRATORY PANEL BY PCR

## 2024-05-28 LAB — LACTIC ACID, PLASMA: Lactic Acid, Venous: 1.9 mmol/L (ref 0.5–1.9)

## 2024-05-28 LAB — MRSA NEXT GEN BY PCR, NASAL: MRSA by PCR Next Gen: NOT DETECTED

## 2024-05-28 LAB — PROCALCITONIN: Procalcitonin: 55.3 ng/mL

## 2024-05-28 MED ORDER — SODIUM CHLORIDE 0.9 % IV SOLN: 1.0000 g | Freq: Once | INTRAVENOUS | Status: DC

## 2024-05-28 MED ORDER — ACETAMINOPHEN 325 MG PO TABS
650.0000 mg | ORAL_TABLET | Freq: Four times a day (QID) | ORAL | Status: DC | PRN
  Administered 2024-06-04 – 2024-06-05 (×2): 650 mg via ORAL
  Filled 2024-05-28 (×2): qty 2

## 2024-05-28 MED ORDER — VANCOMYCIN HCL 1500 MG/300ML IV SOLN
1500.0000 mg | Freq: Once | INTRAVENOUS | Status: DC
  Filled 2024-05-28: qty 300

## 2024-05-28 MED ORDER — LIDOCAINE-PRILOCAINE 2.5-2.5 % EX CREA
1.0000 | TOPICAL_CREAM | CUTANEOUS | Status: DC
  Administered 2024-06-04: 1 via TOPICAL

## 2024-05-28 MED ORDER — CHLORHEXIDINE GLUCONATE CLOTH 2 % EX PADS
6.0000 | MEDICATED_PAD | Freq: Every day | CUTANEOUS | Status: DC
  Administered 2024-05-29 – 2024-06-06 (×8): 6 via TOPICAL
  Filled 2024-05-28 (×2): qty 6

## 2024-05-28 MED ORDER — IPRATROPIUM-ALBUTEROL 0.5-2.5 (3) MG/3ML IN SOLN: 3.0000 mL | RESPIRATORY_TRACT | Status: DC | PRN

## 2024-05-28 MED ORDER — ACETAMINOPHEN 650 MG RE SUPP: 650.0000 mg | Freq: Four times a day (QID) | RECTAL | Status: DC | PRN

## 2024-05-28 MED ORDER — HEPARIN SODIUM (PORCINE) 1000 UNIT/ML IJ SOLN
INTRAMUSCULAR | Status: AC
  Filled 2024-05-28: qty 5

## 2024-05-28 MED ORDER — ASPIRIN 81 MG PO TBEC
81.0000 mg | DELAYED_RELEASE_TABLET | Freq: Every day | ORAL | Status: DC
  Administered 2024-06-02: 81 mg via ORAL
  Filled 2024-05-28 (×2): qty 1

## 2024-05-28 MED ORDER — VANCOMYCIN VARIABLE DOSE PER UNSTABLE RENAL FUNCTION (PHARMACIST DOSING): Status: DC

## 2024-05-28 MED ORDER — DM-GUAIFENESIN ER 30-600 MG PO TB12
1.0000 | ORAL_TABLET | Freq: Two times a day (BID) | ORAL | Status: AC
  Administered 2024-05-31: 1 via ORAL
  Filled 2024-05-28 (×4): qty 1

## 2024-05-28 MED ORDER — PANTOPRAZOLE SODIUM 40 MG PO TBEC
40.0000 mg | DELAYED_RELEASE_TABLET | Freq: Every day | ORAL | Status: DC
  Administered 2024-06-02: 40 mg via ORAL
  Filled 2024-05-28 (×2): qty 1

## 2024-05-28 MED ORDER — ATORVASTATIN CALCIUM 20 MG PO TABS
40.0000 mg | ORAL_TABLET | Freq: Every day | ORAL | Status: DC
  Administered 2024-06-02 – 2024-06-05 (×2): 40 mg via ORAL
  Filled 2024-05-28 (×4): qty 2

## 2024-05-28 MED ORDER — SODIUM CHLORIDE 0.9 % IV SOLN
2.0000 g | INTRAVENOUS | Status: DC
  Administered 2024-05-28: 2 g via INTRAVENOUS
  Filled 2024-05-28 (×2): qty 20

## 2024-05-28 MED ORDER — LEVOTHYROXINE SODIUM 50 MCG PO TABS
50.0000 ug | ORAL_TABLET | Freq: Every day | ORAL | Status: DC
  Administered 2024-06-02: 50 ug via ORAL
  Filled 2024-05-28 (×5): qty 1

## 2024-05-28 MED ORDER — CALCIUM ACETATE (PHOS BINDER) 667 MG PO CAPS
1334.0000 mg | ORAL_CAPSULE | Freq: Three times a day (TID) | ORAL | Status: DC
  Administered 2024-05-29 – 2024-06-05 (×5): 1334 mg via ORAL
  Filled 2024-05-28 (×20): qty 2

## 2024-05-28 MED ORDER — RENA-VITE PO TABS
1.0000 | ORAL_TABLET | Freq: Every day | ORAL | Status: DC
  Administered 2024-06-05 (×2): 1 via ORAL
  Filled 2024-05-28 (×9): qty 1

## 2024-05-28 MED ORDER — HEPARIN SODIUM (PORCINE) 5000 UNIT/ML IJ SOLN
5000.0000 [IU] | Freq: Three times a day (TID) | INTRAMUSCULAR | Status: DC
  Administered 2024-06-05: 5000 [IU] via SUBCUTANEOUS
  Filled 2024-05-28 (×9): qty 1

## 2024-05-28 MED ORDER — VANCOMYCIN HCL 1500 MG/300ML IV SOLN
1500.0000 mg | Freq: Once | INTRAVENOUS | Status: AC
  Administered 2024-05-28: 1500 mg via INTRAVENOUS
  Filled 2024-05-28: qty 300

## 2024-05-28 MED ORDER — SODIUM CHLORIDE 0.9% FLUSH
3.0000 mL | Freq: Two times a day (BID) | INTRAVENOUS | Status: DC
  Administered 2024-05-28 – 2024-06-05 (×16): 3 mL via INTRAVENOUS

## 2024-05-28 MED ORDER — ENSURE ENLIVE PO LIQD
237.0000 mL | Freq: Two times a day (BID) | ORAL | Status: DC
  Filled 2024-05-28: qty 237

## 2024-05-28 NOTE — Consult Note (Signed)
 Pharmacy Antibiotic Note  Larry Morrison is a 54 y.o. male with PMH of hemodialysis MWF admitted on 05/28/2024 with a nephrostomy tube infection. Right sided nephrostomy tube drainage is cloudy with yellow-tan appearance  Pharmacy has been consulted for vancomycin  dosing.  Vancomycin  1500 mg LD x1 ordered Will follow vancomycin  variable dosing protocol and follow nephrology for HD plans and schedule doses post HD session.   Weight: 57 kg (125 lb 10.6 oz)  Temp (24hrs), Avg:98.8 F (37.1 C), Min:98.8 F (37.1 C), Max:98.8 F (37.1 C)  Recent Labs  Lab 05/28/24 1007  WBC 16.3*  CREATININE 11.30*    Estimated Creatinine Clearance: 5.9 mL/min (A) (by C-G formula based on SCr of 11.3 mg/dL (H)).    Allergies[1]  Antimicrobials this admission: 1/7 rocephin  >>  1/7 vancomycin  >>    Microbiology results: 1/7 BCx: pending 1/7 UCx: pending   Thank you for allowing pharmacy to be a part of this patients care.  Annabella LOISE Banks, PharmD Clinical Pharmacist 05/28/2024 1:19 PM      [1] No Known Allergies

## 2024-05-28 NOTE — Progress Notes (Signed)
 Hemodialysis Note:  Received patient in bed to unit. Alert and oriented. Informed consent singed and in chart.  Treatment initiated: 1345 Treatment completed: 1715  Access used: Left internal jugular catheter Access issues: Rinsed back 25 minutes early due to system clot  Unable to remove fluids due to hypotension episodes. 400 ml saline bolus given for BP support. HD NP made aware. Transported back to room, alert without acute distress. Report given to patient's RN.  Total UF removed: 0 Medications given: None  Post HD weight: 49.6 Kg  [facility]
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
## 2024-05-28 NOTE — ED Notes (Signed)
 Patient was incontinent of bowels in previous brief. This NT provided peri care and a clean brief for this patient. Patient states no further needs at this time.

## 2024-05-28 NOTE — ED Provider Notes (Signed)
 "  [facility] Provider Note    Event Date/Time   First MD Initiated Contact with Patient 05/28/24 [phone]     (approximate)   History   Shortness of Breath (Patient to ER via EMS for C/O increased dyspnea - Patient is due for dialysis today)   HPI  Larry Morrison is a 54 y.o. male history of end-stage renal disease on hemodialysis MWF, history of noncompliance, frequent vascular access difficulty, hypertension, hyperlipidemia, hypothyroid, history of tobacco use, GERD, presents to the ED from his facility with chief complaint of shortness of breath.  Patient reports that he has had shortness of breath for the past 24 hours and a cough that is worsening.  Denies any fevers abdominal pain or changes in output from his nephrostomy tube.  He is due for dialysis today      Physical Exam   Triage Vital Signs: ED Triage Vitals  Encounter Vitals Group     BP      Girls Systolic BP Percentile      Girls Diastolic BP Percentile      Boys Systolic BP Percentile      Boys Diastolic BP Percentile      Pulse      Resp      Temp      Temp src      SpO2      Weight      Height      Head Circumference      Peak Flow      Pain Score      Pain Loc      Pain Education      Exclude from Growth Chart     Most recent vital signs: Vitals:   05/28/24 0953  BP: 111/69  Pulse: (!) 113  Resp: (!) 22  Temp: 98.8 F (37.1 C)  SpO2: 95%    Nursing Triage Note reviewed. Vital signs reviewed and patients oxygen saturation is normoxic  General: Patient is very thin, appears chronically ill Head: Normocephalic and atraumatic Eyes: Normal inspection, extraocular muscles intact, no conjunctival pallor Ear, nose, throat: Normal external exam Neck: Normal range of motion Respiratory: Patient is in no respiratory distress, lungs CTAB Cardiovascular: Patient is tachycardic, RR without murmur appreciated GI: Abd SNT with no guarding or rebound Patient does have right  nephrostomy tube that is outputting what appears to be dirty urine with cloudy Extremities: pulses intact with good cap refills, no LE pitting edema or calf tenderness Neuro: The patient is alert and oriented to person, place, and time, appropriately conversive.  Patient is able to lift his legs however these appear deconditioned. Patient is able to move his upper extremities Skin: Warm, dry, and intact   ED Results / Procedures / Treatments   Labs (all labs ordered are listed, but only abnormal results are displayed) Labs Reviewed  CBC WITH DIFFERENTIAL/PLATELET - Abnormal; Notable for the following components:      Result Value   WBC 16.3 (*)    RBC 3.04 (*)    Hemoglobin 9.7 (*)    HCT 31.1 (*)    MCV 102.3 (*)    Neutro Abs 13.4 (*)    Monocytes Absolute 1.3 (*)    Abs Immature Granulocytes 0.22 (*)    All other components within normal limits  COMPREHENSIVE METABOLIC PANEL WITH GFR - Abnormal; Notable for the following components:   Sodium 146 (*)    BUN 51 (*)    Creatinine, Ser 11.30 (*)  Total Protein 8.7 (*)    AST 44 (*)    ALT 53 (*)    GFR, Estimated 5 (*)    Anion gap 19 (*)    All other components within normal limits  RESP PANEL BY RT-PCR (RSV, FLU A&B, COVID)  RVPGX2  URINE CULTURE  CULTURE, BLOOD (ROUTINE X 2)  CULTURE, BLOOD (ROUTINE X 2)  PROCALCITONIN  HEPATITIS B SURFACE ANTIGEN  URINALYSIS, COMPLETE (UACMP) WITH MICROSCOPIC  LACTIC ACID, PLASMA     EKG   RADIOLOGY CXR: No acute abnormality on my independent review interpretation radiologist agrees    PROCEDURES:  Critical Care performed: No  .Ultrasound ED Peripheral IV (Provider)  Date/Time: 05/28/2024 11:11 AM  Performed by: Nicholaus Rolland BRAVO, MD Authorized by: Nicholaus Rolland BRAVO, MD   Procedure details:    Indications: multiple failed IV attempts and poor IV access     Skin Prep: chlorhexidine  gluconate     Location:  Right AC   Angiocath:  20 G   Bedside Ultrasound Guided: Yes      Images: not archived     Patient tolerated procedure without complications: Yes     Dressing applied: Yes      MEDICATIONS ORDERED IN ED: Medications  Chlorhexidine  Gluconate Cloth 2 % PADS 6 each (has no administration in time range)  cefTRIAXone  (ROCEPHIN ) 1 g in sodium chloride  0.9 % 100 mL IVPB (has no administration in time range)     IMPRESSION / MDM / ASSESSMENT AND PLAN / ED COURSE                                Differential diagnosis includes, but is not limited to, upper respiratory infection, pneumonia, need for dialysis, electrolyte derangement, UTI  ED course: Patient presents and he appears weak and deconditioned however he is not febrile or hypoxic.  He is tachycardic it but he did not take his rate controlling medications today.  Patient is not profoundly anemic however does have a new leukocytosis when at baseline he does not have this.  He does have evidence of fluid overload and his creatinine is greater than 11 which is consistent with need for dialysis.  His procalcitonin was extremely elevated and I suspect the source is from his nephrostomy tube.  On review of his culture data from 09/10/2023, it was pansensitive to everything except for nitrofurantoin .  Will initiate ceftriaxone  (would not be dialysis lysed out).  Case discussed with nephrology team (Dr. Marcelino on-call) who will plan for dialysis today.  Given his elevated procalcitonin and deconditioning case discussed with hospitalist for admission   Clinical Course as of 05/28/24 1227  Wed May 28, 2024  1124 Resp panel by RT-PCR (RSV, Flu A&B, Covid) Anterior Nasal Swab Negative [HD]  1129 WBC(!): 16.3 Does have a leukocytosis but no profound anemia [HD]  1130 DG Chest 2 View Chronic pleural effusions [HD]  1130 Pulse Rate(!): 113 Tachycardic but did not take blood pressure medication today [HD]  1150 Procalcitonin: 55.30 Elevated [HD]  1217 Case discussed with hospitalist for admission [HD]     Clinical Course User Index [HD] Nicholaus Rolland BRAVO, MD   -- Risk: 5 This patient has a high risk of morbidity due to further diagnostic testing or treatment. Rationale: This patients evaluation and management involve a high risk of morbidity due to the potential severity of presenting symptoms, need for diagnostic testing, and/or initiation of treatment that may  require close monitoring. The differential includes conditions with potential for significant deterioration or requiring escalation of care. Treatment decisions in the ED, including medication administration, procedural interventions, or disposition planning, reflect this level of risk. COPA: 5 The patient has the following acute or chronic illness/injury that poses a possible threat to life or bodily function: [X] : The patient has a potentially serious acute condition or an acute exacerbation of a chronic illness requiring urgent evaluation and management in the Emergency Department. The clinical presentation necessitates immediate consideration of life-threatening or function-threatening diagnoses, even if they are ultimately ruled out.   FINAL CLINICAL IMPRESSION(S) / ED DIAGNOSES   Final diagnoses:  Elevated procalcitonin  General weakness  Nephrostomy complication  Shortness of breath  ESRD on dialysis [facility])     Rx / DC Orders   ED Discharge Orders     None        Note:  This document was prepared using Dragon voice recognition software and may include unintentional dictation errors.   Nicholaus Rolland BRAVO, MD 05/28/24 1227  "

## 2024-05-28 NOTE — Progress Notes (Signed)
 Pt receives outpt HD at [facility] on MWF at 10:45a. Navigator following to assist with any HD needs.  Suzen Satchel Dialysis Navigator [phone]

## 2024-05-28 NOTE — Hospital Course (Addendum)
 PCCM transfer for 06/03/2024.  Partly taken from prior notes.  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  He recently presented to [facility] ED on 12/30 due to weakness, nausea and vomiting. He improved after arrival and was discharged back to the SNF he resides at. There was suspicion of mild acute pancreatitis given mild lipase elevation but no specific etiology was identified.   He presented back from his SNF on 1/7 with tachypnea and shortness of breath.  He was later transferred to ICU with concern of septic shock requiring pressors with infected right nephrostomy tube.  Urine and body fluid grew E. coli, Citrobacter, Proteus and Enterococcus faecalis.  Blood cultures remain negative.  Initially received broad-spectrum antibiotics which were later continued with ceftriaxone  and Flagyl .  There was also concern of aspiration pneumonitis.  Nephrostomy was exchanged by IR on 1/12.  Blood pressure later somewhat improved and his MAP goal was decreased to 55, he was transferred to [facility] on midodrine .  1/13: Overnight rapid was called as patient developed A-fib with RVR and became hypotensive.  Patient was also due for dialysis this morning which will be a challenge with this blood pressure.  Transfer to progressive care as there is no bed available in ICU.  Cardiology was also consulted. Also had dark-colored emesis, gastric occult blood was ordered, started on IV Protonix .  Trying to avoid antiemetics as his QTc is already prolonged and patient is currently on amiodarone .  If he continue to have nausea and vomiting, we will place NG tube.  Patient with significant underlying comorbidities and very fragile.  Palliative care was consulted but  he wants everything need to be done.  Not sure whether he really comprehend or has capacity to make decisions.  DSS is legal guardian.

## 2024-05-28 NOTE — Progress Notes (Signed)
 " [facility]  ROUNDING NOTE   Subjective:   Larry Morrison is a 54 year old male with past medical conditions including tobacco abuse, anemia, hypertension, TBI, malnutrition, combined heart failure, cocaine abuse, bilateral nephrostomy tubes and end-stage renal disease on hemodialysis.  Patient presents to the emergency department for shortness of breath and has been admitted for Shortness of breath [R06.02] General weakness [R53.1] ESRD on dialysis (HCC) [N18.6, Z99.2] Nephrostomy complication [N99.528] Sepsis (HCC) [A41.9] Elevated procalcitonin [R79.89] . Patient is known to our practice and receives outpatient dialysis treatments at [facility] on a MWF schedule, supervised by Dr. Douglas.  Last treatment received on Monday.  Patient states he has been experiencing short of breath since he awoke this morning.  Normal state of health yesterday.  Has not missed any recent treatments.  Labs on ED arrival concerning for sodium 146, BUN 51, creatinine 11.3 with GFR 5 AST 44, ALT 53, white count 16.3, and hemoglobin 9.7.  Respiratory panel negative for influenza, COVID-19, and RSV.  Blood cultures pending.  Chest x-ray shows chronic pleural effusions, nothing acute.  We have been consulted to manage dialysis needs.   Objective:  Vital signs in last 24 hours:  Temp:  [98.7 F (37.1 C)-98.8 F (37.1 C)] 98.7 F (37.1 C) (01/07 1334) Pulse Rate:  [55-131] 131 (01/07 1400) Resp:  [22-63] 34 (01/07 1400) BP: (98-125)/(63-80) 117/78 (01/07 1400) SpO2:  [85 %-98 %] 96 % (01/07 1400) Weight:  [57 kg] 57 kg (01/07 0951)  Weight change:  Filed Weights   05/28/24 0951  Weight: 57 kg    Intake/Output: No intake/output data recorded.   Intake/Output this shift:  No intake/output data recorded.  Physical Exam: General: NAD  Head: Normocephalic, atraumatic. Moist oral mucosal membranes  Eyes: Anicteric  Lungs:  Coarse crackles, cough  Heart: Regular rate and rhythm   Abdomen:  Soft, nontender  Extremities: 2+ peripheral edema.  Neurologic: Awake, alert, conversant  Skin: Warm,dry, no rash  Access: Left chest IJ PermCath  Bilateral nephrostomy tubes  Basic Metabolic Panel: Recent Labs  Lab 05/28/24 1007  NA 146*  K 4.5  CL 100  CO2 27  GLUCOSE 93  BUN 51*  CREATININE 11.30*  CALCIUM  9.6    Liver Function Tests: Recent Labs  Lab 05/28/24 1007  AST 44*  ALT 53*  ALKPHOS 79  BILITOT 1.0  PROT 8.7*  ALBUMIN  3.6   No results for input(s): LIPASE, AMYLASE in the last 168 hours. No results for input(s): AMMONIA in the last 168 hours.  CBC: Recent Labs  Lab 05/28/24 1007  WBC 16.3*  NEUTROABS 13.4*  HGB 9.7*  HCT 31.1*  MCV 102.3*  PLT 229    Cardiac Enzymes: No results for input(s): CKTOTAL, CKMB, CKMBINDEX, TROPONINI in the last 168 hours.  BNP: Invalid input(s): POCBNP  CBG: No results for input(s): GLUCAP in the last 168 hours.  Microbiology: Results for orders placed or performed during the hospital encounter of 05/28/24  Resp panel by RT-PCR (RSV, Flu A&B, Covid) Anterior Nasal Swab     Status: None   Collection Time: 05/28/24 10:07 AM   Specimen: Anterior Nasal Swab  Result Value Ref Range Status   SARS Coronavirus 2 by RT PCR NEGATIVE NEGATIVE Final    Comment: (NOTE) SARS-CoV-2 target nucleic acids are NOT DETECTED.  The SARS-CoV-2 RNA is generally detectable in upper respiratory specimens during the acute phase of infection. The lowest concentration of SARS-CoV-2 viral copies this assay can detect is 138  copies/mL. A negative result does not preclude SARS-Cov-2 infection and should not be used as the sole basis for treatment or other patient management decisions. A negative result may occur with  improper specimen collection/handling, submission of specimen other than nasopharyngeal swab, presence of viral mutation(s) within the areas targeted by this assay, and inadequate number of  viral copies(<138 copies/mL). A negative result must be combined with clinical observations, patient history, and epidemiological information. The expected result is Negative.  Fact Sheet for Patients:  bloggercourse.com  Fact Sheet for Healthcare Providers:  seriousbroker.it  This test is no t yet approved or cleared by the [place]  FDA and  has been authorized for detection and/or diagnosis of SARS-CoV-2 by FDA under an Emergency Use Authorization (EUA). This EUA will remain  in effect (meaning this test can be used) for the duration of the COVID-19 declaration under Section 564(b)(1) of the Act, 21 U.S.C.section 360bbb-3(b)(1), unless the authorization is terminated  or revoked sooner.       Influenza A by PCR NEGATIVE NEGATIVE Final   Influenza B by PCR NEGATIVE NEGATIVE Final    Comment: (NOTE) The Xpert Xpress SARS-CoV-2/FLU/RSV plus assay is intended as an aid in the diagnosis of influenza from Nasopharyngeal swab specimens and should not be used as a sole basis for treatment. Nasal washings and aspirates are unacceptable for Xpert Xpress SARS-CoV-2/FLU/RSV testing.  Fact Sheet for Patients: bloggercourse.com  Fact Sheet for Healthcare Providers: seriousbroker.it  This test is not yet approved or cleared by the [place]  FDA and has been authorized for detection and/or diagnosis of SARS-CoV-2 by FDA under an Emergency Use Authorization (EUA). This EUA will remain in effect (meaning this test can be used) for the duration of the COVID-19 declaration under Section 564(b)(1) of the Act, 21 U.S.C. section 360bbb-3(b)(1), unless the authorization is terminated or revoked.     Resp Syncytial Virus by PCR NEGATIVE NEGATIVE Final    Comment: (NOTE) Fact Sheet for Patients: bloggercourse.com  Fact Sheet for Healthcare  Providers: seriousbroker.it  This test is not yet approved or cleared by the [place]  FDA and has been authorized for detection and/or diagnosis of SARS-CoV-2 by FDA under an Emergency Use Authorization (EUA). This EUA will remain in effect (meaning this test can be used) for the duration of the COVID-19 declaration under Section 564(b)(1) of the Act, 21 U.S.C. section 360bbb-3(b)(1), unless the authorization is terminated or revoked.  Performed at [facility], [address] [place], [place] [address]     Coagulation Studies: No results for input(s): LABPROT, INR in the last 72 hours.  Urinalysis: Recent Labs    05/28/24 1252  COLORURINE YELLOW*  LABSPEC 1.025  PHURINE 7.0  GLUCOSEU NEGATIVE  HGBUR SMALL*  BILIRUBINUR NEGATIVE  KETONESUR NEGATIVE  PROTEINUR 100*  NITRITE NEGATIVE  LEUKOCYTESUR MODERATE*      Imaging: DG Chest 2 View Result Date: 05/28/2024 EXAM: 2 VIEW(S) XRAY OF THE CHEST 05/28/2024 10:29:00 AM COMPARISON: Chest radiographs 09/23/2021 and earlier. CLINICAL HISTORY: 54 year old male. Due for renal replacement therapy. Worsening cough. Dialysis patient. FINDINGS: LINES, TUBES AND DEVICES: Left IJ dual lumen dialysis catheter in place with tip at cavoatrial junction. Right and left axillary vascular stents noted. Pigtail drainage catheter in place in right upper quadrant, likely retroperitoneal / renal related. LUNGS AND PLEURA: Mild pulmonary edema. Patchy airspace opacities in bilateral lower lobes. Moderate left pleural effusion appears chronic, stable since 2023. Small right pleural effusion has regressed since that time. No pneumothorax. HEART AND  MEDIASTINUM: No acute abnormality of the cardiac and mediastinal silhouettes. BONES AND SOFT TISSUES: No acute osseous abnormality. IMPRESSION: 1. Chronic pleural effusions, moderate and stable on the left and regressed on the right since 2023. No new cardiopulmonary  abnormality. 2. Lines, tubes, and stents as above. Electronically signed by: Helayne Hurst MD 05/28/2024 10:43 AM EST RP Workstation: [id number]     Medications:    cefTRIAXone  (ROCEPHIN )  IV 2 g (05/28/24 1310)   vancomycin  HCl      Chlorhexidine  Gluconate Cloth  6 each Topical Q0600   dextromethorphan -guaiFENesin   1 tablet Oral BID   heparin   5,000 Units Subcutaneous Q8H   sodium chloride  flush  3 mL Intravenous Q12H   vancomycin  variable dose per unstable renal function (pharmacist dosing)   Does not apply See admin instructions   acetaminophen  **OR** acetaminophen , ipratropium-albuterol   Assessment/ Plan:  [name] is a 54 y.o.  male with past medical conditions including tobacco abuse, anemia, hypertension, TBI, malnutrition, combined heart failure, cocaine abuse, bilateral nephrostomy tubes and end-stage renal disease on hemodialysis.  Patient presents to the emergency department for shortness of breath and has been admitted for Shortness of breath [R06.02] General weakness [R53.1] ESRD on dialysis (HCC) [N18.6, Z99.2] Nephrostomy complication [N99.528] Sepsis (HCC) [A41.9] Elevated procalcitonin [R79.89]    End-stage renal disease on hemodialysis.  Last treatment on Monday.  Patient scheduled to receive treatment today, UF goal 1.5 to 2 L as tolerated.  Next treatment scheduled for Friday.  2.  Sepsis of unknown origin, infection of nephrostomy tubes versus dialysis catheter.  Blood cultures pending.  Elevated white count and leukocytosis.  Vancomycin  and Rocephin  ordered by primary team.  Primary team has ordered urine and culture from nephrostomy tube.  3. Anemia of chronic kidney disease Lab Results  Component Value Date   HGB 9.7 (L) 05/28/2024    Hemoglobin within desired range.  No need for ESA's at this time.  4. Secondary Hyperparathyroidism: with outpatient labs: PTH 438, phosphorus 5.9, calcium  8.3 on 04/28/24.   Lab Results  Component Value Date    CALCIUM  9.6 05/28/2024   CAION 0.79 (LL) 09/23/2021   PHOS 6.2 (H) 03/07/2024    Patient prescribed calcitriol  and calcium  acetate outpatient.  Will continue to monitor bone minerals.  5.  Hypertension with chronic kidney disease.  Home regimen includes carvedilol  and irbesartan , both currently held.   LOS: 0 Yunique [place]cos 1/7/20262:18 PM   "

## 2024-05-28 NOTE — ED Notes (Signed)
 Patient had incontinence of bowels. This NT provided peri care, a clean brief, and clean chux pad for the patient. The patient has no further requests at this time.

## 2024-05-29 ENCOUNTER — Ambulatory Visit: Admission: RE | Admit: 2024-05-29 | Source: Home / Self Care | Admitting: Vascular Surgery

## 2024-05-29 ENCOUNTER — Encounter
Admission: EM | Disposition: A | Discharge status: Skilled Nursing Facility | Payer: Self-pay | Source: Skilled Nursing Facility | Attending: Internal Medicine

## 2024-05-29 DIAGNOSIS — R7401 Elevation of levels of liver transaminase levels: Secondary | ICD-10-CM | POA: Diagnosis not present

## 2024-05-29 DIAGNOSIS — I5040 Unspecified combined systolic (congestive) and diastolic (congestive) heart failure: Secondary | ICD-10-CM | POA: Diagnosis not present

## 2024-05-29 DIAGNOSIS — R6521 Severe sepsis with septic shock: Secondary | ICD-10-CM | POA: Diagnosis not present

## 2024-05-29 DIAGNOSIS — E039 Hypothyroidism, unspecified: Secondary | ICD-10-CM

## 2024-05-29 DIAGNOSIS — Z992 Dependence on renal dialysis: Secondary | ICD-10-CM | POA: Diagnosis not present

## 2024-05-29 DIAGNOSIS — J90 Pleural effusion, not elsewhere classified: Secondary | ICD-10-CM | POA: Diagnosis not present

## 2024-05-29 DIAGNOSIS — I132 Hypertensive heart and chronic kidney disease with heart failure and with stage 5 chronic kidney disease, or end stage renal disease: Secondary | ICD-10-CM | POA: Diagnosis not present

## 2024-05-29 DIAGNOSIS — N186 End stage renal disease: Secondary | ICD-10-CM | POA: Diagnosis not present

## 2024-05-29 DIAGNOSIS — D649 Anemia, unspecified: Secondary | ICD-10-CM

## 2024-05-29 DIAGNOSIS — E43 Unspecified severe protein-calorie malnutrition: Secondary | ICD-10-CM | POA: Diagnosis not present

## 2024-05-29 DIAGNOSIS — A419 Sepsis, unspecified organism: Secondary | ICD-10-CM | POA: Diagnosis not present

## 2024-05-29 DIAGNOSIS — J9601 Acute respiratory failure with hypoxia: Secondary | ICD-10-CM | POA: Diagnosis not present

## 2024-05-29 LAB — RENAL FUNCTION PANEL
Albumin: 3.1 g/dL — ABNORMAL LOW (ref 3.5–5.0)
Anion gap: 26 — ABNORMAL HIGH (ref 5–15)
BUN: 31 mg/dL — ABNORMAL HIGH (ref 6–20)
CO2: 20 mmol/L — ABNORMAL LOW (ref 22–32)
Calcium: 9.2 mg/dL (ref 8.9–10.3)
Chloride: 92 mmol/L — ABNORMAL LOW (ref 98–111)
Creatinine, Ser: 6.59 mg/dL — ABNORMAL HIGH (ref 0.61–1.24)
GFR, Estimated: 9 mL/min — ABNORMAL LOW
Glucose, Bld: 89 mg/dL (ref 70–99)
Phosphorus: 5.5 mg/dL — ABNORMAL HIGH (ref 2.5–4.6)
Potassium: 3.7 mmol/L (ref 3.5–5.1)
Sodium: 137 mmol/L (ref 135–145)

## 2024-05-29 LAB — CBC
HCT: 27.2 % — ABNORMAL LOW (ref 39.0–52.0)
Hemoglobin: 8.5 g/dL — ABNORMAL LOW (ref 13.0–17.0)
MCH: 32.2 pg (ref 26.0–34.0)
MCHC: 31.3 g/dL (ref 30.0–36.0)
MCV: 103 fL — ABNORMAL HIGH (ref 80.0–100.0)
Platelets: 233 K/uL (ref 150–400)
RBC: 2.64 MIL/uL — ABNORMAL LOW (ref 4.22–5.81)
RDW: 14.2 % (ref 11.5–15.5)
WBC: 15.8 K/uL — ABNORMAL HIGH (ref 4.0–10.5)
nRBC: 0 % (ref 0.0–0.2)

## 2024-05-29 LAB — GLUCOSE, CAPILLARY: Glucose-Capillary: 90 mg/dL (ref 70–99)

## 2024-05-29 LAB — EXPECTORATED SPUTUM ASSESSMENT W GRAM STAIN, RFLX TO RESP C

## 2024-05-29 LAB — MAGNESIUM: Magnesium: 1.9 mg/dL (ref 1.7–2.4)

## 2024-05-29 LAB — PROCALCITONIN: Procalcitonin: 66.1 ng/mL

## 2024-05-29 LAB — PRO BRAIN NATRIURETIC PEPTIDE: Pro Brain Natriuretic Peptide: 6557 pg/mL — ABNORMAL HIGH

## 2024-05-29 LAB — LACTIC ACID, PLASMA: Lactic Acid, Venous: 1.1 mmol/L (ref 0.5–1.9)

## 2024-05-29 SURGERY — UPPER EXTREMITY VENOGRAPHY
Anesthesia: Moderate Sedation | Laterality: Bilateral

## 2024-05-29 MED ORDER — MIDAZOLAM HCL 2 MG/ML PO SYRP: 8.0000 mg | ORAL_SOLUTION | Freq: Once | ORAL | Status: DC | PRN

## 2024-05-29 MED ORDER — CEFAZOLIN SODIUM-DEXTROSE 1-4 GM/50ML-% IV SOLN
1.0000 g | INTRAVENOUS | Status: DC
  Filled 2024-05-29: qty 50

## 2024-05-29 MED ORDER — HYDROMORPHONE HCL 1 MG/ML IJ SOLN
1.0000 mg | Freq: Once | INTRAMUSCULAR | Status: AC | PRN
  Administered 2024-05-29: 1 mg via INTRAVENOUS
  Filled 2024-05-29: qty 1

## 2024-05-29 MED ORDER — NOREPINEPHRINE 4 MG/250ML-% IV SOLN
0.0000 ug/min | INTRAVENOUS | Status: DC
  Administered 2024-05-29: 8 ug/min via INTRAVENOUS
  Administered 2024-05-29: 2 ug/min via INTRAVENOUS
  Administered 2024-05-29: 8 ug/min via INTRAVENOUS
  Administered 2024-05-30: 2 ug/min via INTRAVENOUS
  Filled 2024-05-29 (×4): qty 250

## 2024-05-29 MED ORDER — FAMOTIDINE 20 MG PO TABS
40.0000 mg | ORAL_TABLET | Freq: Once | ORAL | Status: AC | PRN
  Administered 2024-06-05: 40 mg via ORAL
  Filled 2024-05-29: qty 2

## 2024-05-29 MED ORDER — METHYLPREDNISOLONE SODIUM SUCC 125 MG IJ SOLR: 125.0000 mg | Freq: Once | INTRAMUSCULAR | Status: DC | PRN

## 2024-05-29 MED ORDER — HYDROMORPHONE HCL 1 MG/ML IJ SOLN
1.0000 mg | Freq: Four times a day (QID) | INTRAMUSCULAR | Status: DC | PRN
  Administered 2024-05-30 – 2024-06-05 (×14): 1 mg via INTRAVENOUS
  Filled 2024-05-29 (×16): qty 1

## 2024-05-29 MED ORDER — ORAL CARE MOUTH RINSE: 15.0000 mL | OROMUCOSAL | Status: DC | PRN

## 2024-05-29 MED ORDER — SODIUM CHLORIDE 0.9 % IV SOLN: INTRAVENOUS | Status: DC

## 2024-05-29 MED ORDER — PIPERACILLIN-TAZOBACTAM IN DEX 2-0.25 GM/50ML IV SOLN
2.2500 g | Freq: Three times a day (TID) | INTRAVENOUS | Status: DC
  Administered 2024-05-29 – 2024-05-31 (×7): 2.25 g via INTRAVENOUS
  Filled 2024-05-29 (×8): qty 50

## 2024-05-29 MED ORDER — DIPHENHYDRAMINE HCL 50 MG/ML IJ SOLN: 50.0000 mg | Freq: Once | INTRAMUSCULAR | Status: DC | PRN

## 2024-05-29 MED ORDER — MIDODRINE HCL 5 MG PO TABS
5.0000 mg | ORAL_TABLET | Freq: Once | ORAL | Status: AC
  Administered 2024-05-29: 5 mg via ORAL
  Filled 2024-05-29: qty 1

## 2024-05-29 NOTE — Consult Note (Signed)
 "  NAME:  Larry Morrison, MRN:  [id number], DOB:  1970/07/10, LOS: 1 ADMISSION DATE:  05/28/2024, CONSULTATION DATE:  05/29/2024 REFERRING MD:  Dr. 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  He recently presented to [facility] ED on 12/30 due to weakness, nausea and vomiting. He improved after arrival and was discharged back to the SNF he resides at. There was suspicion of mild acute pancreatitis given mild lipase elevation but no specific etiology was identified.  History gathered via chart review and evaluation at bedside He presented from his nursing home via EMS to [facility] ED on 05/28/24 due to tachypnea and of shortness of breath. The patient endorsed his shortness of breath had been present for approximately 24 hours, along with a worsening, wet cough. At the time of evaluation in the ED, he denied fevers and abdominal pain. His last dialysis session was on Monday and he was due for dialysis per his normal dialysis schedule on arrival. It was noted that his right nephrostomy tube output was a tan/yellow color, different from his baseline output appearance.   ED Course  Vitals on Arrival: Temp 98.85F, BP 11/69, HR 113, RR 22, SpO2 95%  Pertinent Labs/Diagnostics Chemistry:  Na+:146, K+: 4.5, BUN/Cr.: 51/11.3, Serum CO2/ AG: 27/19, AST/ALT: 44/53 Hematology:  WBC: 16.3, Hgb: 9.7, Platelets 229 BNP: pending Lactic/ PCT: 1.9/55.3 COVID-19 & Influenza A/B: negative MRSA PCR: negative Blood cultures x 2 drawn Respiratory Viral Panel (20 pathogen): negative UA: turbid appearance, small Hgb and moderate leukocytes Urine culture ordered CXR 05/28/24: chronic pleural  effusions; stable on the left and regressed on the right without any acute cardiopulmonary process.  Medications Administered Ceftriaxone  x 1 Vancomycin   The patient was evaluated by the hospitalist service for admission and nephrology was consulted with plans for dialysis.   Overnight the patient was noted to have persistent hypotension. His MAPs were in the mid 50's to mid 60's. He was administered 5mg  of midodrine  with very minimal improvement in blood pressure. Given his heart failure history, higher doses were not attempted.   PCCM consulted for admission due to hypotension and was started on a levophed  gtt.   Pertinent Medical History  Hypertension Pulmonary Hypertension HFrEF Cardiomyopathy ESRD on hemodialysis History of left Nephrectomy Right sided Nephrostomy Stroke Tobacco abuse Protein Calorie Malnutrition Polysubstance abuse Neurogenic Bladder with chronic indwelling foley catheter Anemia of Chronic Disease  Significant Hospital Events: Including procedures, antibiotic start and stop dates in addition to other pertinent events   05/28/24: Presented to [facility] ED with concern for shortness of breath. Was not found to be overly hypoxic; stable on 4L nasal cannula. Admitted for sepsis workup with possible source being right nephrostomy tube. Nephrology consulted with plan for hemodialysis. 05/29/24: Did not complete dialysis d/t progressively worsening hypotension requiring PCCM consult for levophed  gtt. SpO2 stable on 4L nasal cannula. Started on Zosyn  and Vanc.   Interim History / Subjective:  Patient became progressively hypotensive throughout the evening, MAPs in the high 50's - mid 60's. Given hypotension he was unable to complete dialysis. Midodrine  5mg  was administered with some improvement but hypotension persisted. Levophed  gtt started.  Objective    Blood pressure (!) 74/49, pulse 91,  temperature 98.7 F (37.1 C), temperature source Oral, resp. rate (!) 34, weight  55.5 kg, SpO2 100%.        Intake/Output Summary (Last 24 hours) at 05/29/2024 0309 Last data filed at 05/28/2024 2132 Gross per 24 hour  Intake 300.45 ml  Output 0 ml  Net 300.45 ml   Filed Weights   05/28/24 1334 05/28/24 1715 05/29/24 0228  Weight: 49.6 kg 49.6 kg 55.5 kg    Examination: General: acute on chronically ill and cachetic appearing male, resting in bed, in NAD HENT: atraumatic, normocephalic, supple, no JVD Lungs: mildly diminished with bibasilar rales, normal effort, no respiratory distress Cardiovascular: NSR, S1 S2, no m/r/g Abdomen: soft, non-tender, non-distended, no rebound/guarding, bowel sounds x 4, right sided nephrostomy tube to drain bag displaying clouding tan/yellow output Extremities: dry, intact, no edema, radial and distal pulses 2+ Neuro: alert and oriented x 3 (unsure about the year), slow to respond (likely given hx of TBI), no focal neuro deficits, PERRL GU: deferred  Resolved problem list   Assessment and Plan   #Septic Shock s/t suspected source: nephrostomy tube vs UTI vs dialysis catheter #HFrEF (LVEF <20%) - Systolic and Diastolic Heart Failure Hx: Hypertension, Hyperlipidemia Previous interventions: Midodrine  5mg  with minimal relief and given EF%, cautious IVF - Continuous cardiac monitoring - Vasopressors to maintain MAP goal >65 - Currently on peripheral levophed  - Wean vasopressors as able - EKG prn - Trend lactic and PCT; initial PCT 55.3 and lactic 1.9 - BNP pending - ECHO pending - Hold home antihypertensives - Continue Lipitor 40mg   #Acute Hypoxic Respiratory Failure 05/28/24 CXR: chronic pleural effusions; no acute cardiopulmonary process. - Supplemental O2 to maintain SpO2 >92% - Duonebs Q4h prn - ABG and CXR as indicated  #Acute on Chronic Kidney Disease #ESRD on Hemodialysis #AGMA s/t Acute on Chronic Kidney Disease + Septic Shock Hx: CKD Stage V, Left nephrectomy, S/P right nephrostomy, Neurogenic bladder requiring  indwelling foley catheter - Strict I/O - Trend BMP - Monitor magnesium  (>2) and phosphorous (>4) - Avoid nephrotoxins as able - Ensure adequate renal perfusion - ICU electrolyte replacement protocol as indicated - Nephrology following; appreciate input  #GERD #Severe protein calorie malnutrition #Mild transaminitis - Monitor hepatic function  - AST/ALT: 44/53 (improved from previous admission 120/155) - Continue Protonix  40mg  - Constipation protocol prn - Diet: liquid as tolerated  #Anemia of Chronic Disease - Trend CBC - Transfuse if Hgb < 7 or platelets < 10 - Monitor for s/sx of bleeding - DVT Prophylaxis: SQ heparin   #Hypothyroidism - Continue levothyroxine  - CBG q4h - ICU hypo/hyperglycemia protocol - SSI as indicated - Goal range 140-180  #Sepsis with unknown etiology - suspected nephrostomy tube vs UTI vs HD catheter - Trend WBC and monitor fever curve - Blood cultures pending - RVP 20 pathogen: negative - Flu/Covid/RSV: negative - Urine culture pending - ABX: Zosyn  and Vancomycin  - Narrow pending culture and sensitivities  Labs   CBC: Recent Labs  Lab 05/28/24 1007  WBC 16.3*  NEUTROABS 13.4*  HGB 9.7*  HCT 31.1*  MCV 102.3*  PLT 229    Basic Metabolic Panel: Recent Labs  Lab 05/28/24 1007  NA 146*  K 4.5  CL 100  CO2 27  GLUCOSE 93  BUN 51*  CREATININE 11.30*  CALCIUM  9.6   GFR: Estimated Creatinine Clearance: 5.8 mL/min (A) (by C-G formula based on SCr of 11.3 mg/dL (H)). Recent Labs  Lab 05/28/24 1007 05/28/24 1252  PROCALCITON 55.30  --   WBC  16.3*  --   LATICACIDVEN  --  1.9    Liver Function Tests: Recent Labs  Lab 05/28/24 1007  AST 44*  ALT 53*  ALKPHOS 79  BILITOT 1.0  PROT 8.7*  ALBUMIN  3.6   No results for input(s): LIPASE, AMYLASE in the last 168 hours. No results for input(s): AMMONIA in the last 168 hours.  ABG    Component Value Date/Time   PHART 7.40 04/13/2021 0530   PCO2ART 34 04/13/2021  0530   PO2ART 131 (H) 04/13/2021 0530   HCO3 21.1 04/13/2021 0530   TCO2 22 09/23/2021 0834   ACIDBASEDEF 3.2 (H) 04/13/2021 0530   O2SAT 99.0 04/13/2021 0530     Coagulation Profile: No results for input(s): INR, PROTIME in the last 168 hours.  Cardiac Enzymes: No results for input(s): CKTOTAL, CKMB, CKMBINDEX, TROPONINI in the last 168 hours.  HbA1C: Hgb A1c MFr Bld  Date/Time Value Ref Range Status  09/23/2021 09:05 PM 5.3 4.8 - 5.6 % Final    Comment:    (NOTE) Pre diabetes:          5.7%-6.4%  Diabetes:              >6.4%  Glycemic control for   <7.0% adults with diabetes   06/30/2021 09:07 AM 5.0 4.8 - 5.6 % Final    Comment:    (NOTE) Pre diabetes:          5.7%-6.4%  Diabetes:              >6.4%  Glycemic control for   <7.0% adults with diabetes     CBG: No results for input(s): GLUCAP in the last 168 hours.  Review of Systems:   Review of Systems  Constitutional:  Negative for chills and fever.  Respiratory:  Positive for cough and shortness of breath.   Cardiovascular:  Negative for chest pain and palpitations.  Gastrointestinal:  Negative for abdominal pain, heartburn, nausea and vomiting.  Genitourinary:  Negative for dysuria and hematuria.  Neurological:  Positive for weakness. Negative for dizziness and headaches.     Past Medical History:  He,  has a past medical history of Anemia of chronic renal failure, Brain aneurysm (1991), Cardiac arrest (HCC) (04/11/2021), Cardiomyopathy (HCC), Dyspnea, ESRD on hemodialysis (HCC), Foot drop, HFrEF (heart failure with reduced ejection fraction) (HCC), History of 2019 novel coronavirus disease (COVID-19) (05/2020), History of kidney stones, History of left nephrectomy, History of nephrostomy, Hyperkalemia, Hypertension, Incontinent of feces, Neurogenic bladder, Pleural effusion (02/28/2021), Pneumonia (03/2021), Polysubstance abuse (HCC), Potential for violence, Protein calorie malnutrition,  Pulmonary HTN (HCC), Right testicular torsion (10/27/2016), Stroke (HCC), Thrombocytopenia, Tobacco abuse, and Wheelchair dependent.   Surgical History:   Past Surgical History:  Procedure Laterality Date   A/V SHUNT INTERVENTION N/A 03/01/2021   Procedure: A/V SHUNT INTERVENTION;  Surgeon: Jama Cordella MATSU, MD;  Location: [facility] INVASIVE [facility];  Service: Cardiovascular;  Laterality: N/A;   A/V SHUNT INTERVENTION Left 09/26/2023   Procedure: A/V SHUNT INTERVENTION;  Surgeon: Marea Selinda RAMAN, MD;  Location: [facility];  Service: Cardiovascular;  Laterality: Left;   AV FISTULA PLACEMENT Right 08/07/2019   Procedure: ARTERIOVENOUS (AV) FISTULA CREATION;  Surgeon: Marea Selinda RAMAN, MD;  Location: [facility] ORS;  Service: Vascular;  Laterality: Right;   AV FISTULA PLACEMENT Right 11/20/2019   Procedure: INSERTION OF ARTERIOVENOUS (AV) GORE-TEX GRAFT ARM;  Surgeon: Marea Selinda RAMAN, MD;  Location: [facility] ORS;  Service: Vascular;  Laterality: Right;   AV FISTULA PLACEMENT  Left 09/30/2021   Procedure: INSERTION OF ARTERIOVENOUS (AV) GORE-TEX GRAFT ARM BRACHIAL AXILLARY;  Surgeon: Jama Cordella MATSU, MD;  Location: [facility] ORS;  Service: Vascular;  Laterality: Left;   CRANIOTOMY Left    Tx of ruptured congenital brain aneurysm   DIALYSIS/PERMA CATHETER INSERTION Right 03/06/2024   Procedure: DIALYSIS/PERMA CATHETER INSERTION;  Surgeon: Marea Selinda RAMAN, MD;  Location: [facility];  Service: Cardiovascular;  Laterality: Right;   DIALYSIS/PERMA CATHETER REMOVAL N/A 11/15/2021   Procedure: DIALYSIS/PERMA CATHETER REMOVAL;  Surgeon: Jama Cordella MATSU, MD;  Location: [facility];  Service: Cardiovascular;  Laterality: N/A;   IR NEPHROSTOMY EXCHANGE RIGHT  09/10/2020   IR NEPHROSTOMY EXCHANGE RIGHT  07/01/2021   IR NEPHROSTOMY EXCHANGE RIGHT  09/01/2021   IR NEPHROSTOMY EXCHANGE RIGHT  12/22/2021   IR NEPHROSTOMY EXCHANGE RIGHT  08/08/2022   IR NEPHROSTOMY EXCHANGE RIGHT  11/07/2022   IR NEPHROSTOMY EXCHANGE  RIGHT  02/08/2023   IR NEPHROSTOMY EXCHANGE RIGHT  05/10/2023   IR NEPHROSTOMY EXCHANGE RIGHT  08/14/2023   IR NEPHROSTOMY EXCHANGE RIGHT  12/13/2023   IR NEPHROSTOMY EXCHANGE RIGHT  03/25/2024   NEPHRECTOMY Left    NEPHROSTOMY Right    ORCHIECTOMY Right 10/27/2016   Procedure: PSB ORCHIECTOMY;  Surgeon: Penne Knee, MD;  Location: [facility] ORS;  Service: Urology;  Laterality: Right;   ORCHIOPEXY Bilateral 10/27/2016   Procedure: ORCHIOPEXY ADULT;  Surgeon: Penne Knee, MD;  Location: [facility] ORS;  Service: Urology;  Laterality: Bilateral;   PERIPHERAL VASCULAR THROMBECTOMY Left 12/19/2021   Procedure: PERIPHERAL VASCULAR THROMBECTOMY;  Surgeon: Marea Selinda RAMAN, MD;  Location: [facility];  Service: Cardiovascular;  Laterality: Left;   SCROTAL EXPLORATION Bilateral 10/27/2016   Procedure: SCROTUM EXPLORATION;  Surgeon: Penne Knee, MD;  Location: [facility] ORS;  Service: Urology;  Laterality: Bilateral;     Social History:   reports that he has been smoking cigarettes. He has never used smokeless tobacco. He reports that he does not currently use alcohol. He reports that he does not currently use drugs after having used the following drugs: Marijuana and Cocaine.   Family History:  His family history includes Hypertension in his mother.   Allergies Allergies[1]   Home Medications  Prior to Admission medications  Medication Sig Start Date End Date Taking? Authorizing Provider  acetaminophen  (TYLENOL ) 325 MG tablet Take 2 tablets (650 mg total) by mouth every 6 (six) hours as needed for mild pain, fever or moderate pain. 10/01/21  Yes Alexander, Natalie, DO  albuterol  (VENTOLIN  HFA) 108 (90 Base) MCG/ACT inhaler Inhale 1-2 puffs into the lungs every 4 (four) hours as needed for wheezing or shortness of breath. 10/01/21  Yes Marsa Edelman, DO  aspirin  EC 81 MG EC tablet Take 1 tablet (81 mg total) by mouth daily. Swallow whole. 10/02/21  Yes Alexander, Natalie, DO  atorvastatin   (LIPITOR) 40 MG tablet Take 1 tablet (40 mg total) by mouth daily. 10/02/21  Yes Alexander, Natalie, DO  calcium  acetate (PHOSLO ) 667 MG capsule Take 2 capsules (1,334 mg total) by mouth 3 (three) times daily with meals. 10/01/21  Yes Alexander, Natalie, DO  carvedilol  (COREG ) 6.25 MG tablet Take 6.25 mg by mouth 2 (two) times daily. 03/15/22  Yes [provider]  irbesartan  (AVAPRO ) 75 MG tablet Take 2 tablets (150 mg total) by mouth every evening. Patient taking differently: Take 75 mg by mouth every evening. 10/01/21  Yes Alexander, Natalie, DO  levothyroxine  (SYNTHROID ) 50 MCG tablet Take 50 mcg by mouth daily. 03/15/22  Yes [provider]  lidocaine -prilocaine  (EMLA ) cream Apply 1 Application topically 3 (three) times a week. Apply to dialysis site Mon, Wed, Fri   Yes [provider]  multivitamin (RENA-VIT) TABS tablet Take 1 tablet by mouth at bedtime. 10/01/21  Yes Alexander, Natalie, DO  ondansetron  (ZOFRAN -ODT) 4 MG disintegrating tablet Take 1 tablet (4 mg total) by mouth every 6 (six) hours as needed for nausea or vomiting. 09/10/23  Yes Ward, Josette SAILOR, DO  pantoprazole  (PROTONIX ) 40 MG tablet Take 40 mg by mouth daily. 11/16/23  Yes [provider]  feeding supplement (ENSURE ENLIVE / ENSURE PLUS) LIQD Take 237 mLs by mouth 2 (two) times daily between meals. 10/01/21   Alexander, Natalie, DO  Transparent Dressings (TEGADERM FILM [phone]) MISC Apply 1 each topically. 02/06/19   [provider]     Critical care time: 65 minutes     Akilah Cureton, PA-C [facility] PCCM Team Contact Info: [phone]         [1] No Known Allergies  "

## 2024-05-29 NOTE — TOC Initial Note (Signed)
 Transition of Care [facility]) - Initial/Assessment Note    Patient Details  Name: Larry Morrison MRN: [id number] Date of Birth: [birth year]  [facility] CM/SW Contact:    Corrie JINNY Ruts, LCSW Phone Number: 05/29/2024, 1:45 PM  Clinical Narrative:                 Chart reviewed. The patient has a legal guardian with [place] DSS. I was able to speak with Larry Morrison the patient guardian. I introduced myself, my role, and reason for phone call. The patient guardian confirmed that the patient is a resident at [facility] assisted living. The guardian reports that the patient will return there at D/C.   The guardian reports that the patient needs assistance with ADLs. The guardian reports that the assisted living facility would drive the patient to medical appointments. The [facility]   The guardian reports that [facility] will assist the patient at D/C. The guardian provided the number of contact person from the facility. [name], [phone] when the patient is ready for D/C.   The guardian reports that the patient had HH in the past but does not remember the agency. The guardian reports that the patient has never been admitted into a SNF in the past. The patient is wheel chair bound.  Expected Discharge Plan: Assisted Living Barriers to Discharge: Continued Medical Work up   Patient Goals and CMS Choice            Expected Discharge Plan and Services       Living arrangements for the past 2 months: Assisted Living Facility                                      Prior Living Arrangements/Services Living arrangements for the past 2 months: Assisted Living Facility Lives with:: Facility Resident Patient language and need for interpreter reviewed:: Yes        Need for Family Participation in Patient Care: Yes (Comment)     Criminal Activity/Legal Involvement Pertinent to Current Situation/Hospitalization: No -  Comment as needed  Activities of Daily Living   ADL Screening (condition at time of admission) Independently performs ADLs?: No Does the patient have a NEW difficulty with bathing/dressing/toileting/self-feeding that is expected to last >3 days?: No Does the patient have a NEW difficulty with getting in/out of bed, walking, or climbing stairs that is expected to last >3 days?: No Does the patient have a NEW difficulty with communication that is expected to last >3 days?: No Is the patient deaf or have difficulty hearing?: No Does the patient have difficulty seeing, even when wearing glasses/contacts?: No Does the patient have difficulty concentrating, remembering, or making decisions?: No  Permission Sought/Granted   Permission granted to share information with : Yes, Release of Information Signed  Share Information with NAME: Larry Morrison (DSS legal guardian)     Permission granted to share info w Relationship: legal gaurdian  Permission granted to share info w Contact Information: [phone]  Emotional Assessment   Attitude/Demeanor/Rapport: Unable to Assess Affect (typically observed): Unable to Assess   Alcohol / Substance Use: Not Applicable Psych Involvement: No (comment)  Admission diagnosis:  Shortness of breath [R06.02] General weakness [R53.1] ESRD on dialysis (HCC) [N18.6, Z99.2] Nephrostomy complication [N99.528] Sepsis (HCC) [A41.9] Elevated procalcitonin [R79.89] Patient Active Problem List   Diagnosis Date Noted   Sepsis (HCC) 05/28/2024  Hemodialysis graft malfunction 03/05/2024   Hypothyroidism 03/04/2024   Palliative care by specialist 03/04/2024   Dialysis AV fistula malfunction, initial encounter 03/03/2024   Hyperlipidemia 07/05/2022   ESRD on hemodialysis [facility])    Cocaine use disorder (HCC)    History of ischemic stroke    Chronic combined systolic and diastolic CHF (congestive heart failure) (HCC)    SOB (shortness of breath)    Prolonged QT  interval 08/30/2021   Chronic combined systolic and diastolic heart failure (HCC) 06/30/2021   Fall 06/30/2021   ESRD (end stage renal disease) on dialysis [facility])    Malfunction of nephrostomy tube    Malnutrition of moderate degree 04/11/2021   Acute on chronic combined systolic and diastolic CHF (congestive heart failure) (HCC) 04/09/2021   Problem with dialysis access 04/09/2021   Protein-calorie malnutrition, severe 03/01/2021   Loculated pleural effusion 02/28/2021   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   History of traumatic brain injury 09/07/2020   Hypoglycemia 09/04/2020   Uremia 09/04/2020   Diarrhea 07/24/2020   History of COVID-19 07/24/2020   Hyperphosphatemia 07/24/2020   Respiratory alkalosis 07/24/2020   COVID-19 virus infection 06/18/2020   Thrombocytopenia 06/18/2020   Elevated troponin 06/18/2020   Hyperkalemia 06/13/2020   Anemia in ESRD (end-stage renal disease) (HCC) 09/17/2019   Essential hypertension 07/15/2019   Testicular torsion    Pyuria 06/10/2016   Chronic pain following surgery or procedure 12/01/2015   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HCC) 12/01/2015   Tobacco use disorder 12/01/2015   Obstructed nephrostomy tube 10/23/2013   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 [place] [address] Phone: [phone] Fax: [phone]     Social Drivers of Health (SDOH) Social History: SDOH Screenings   Food Insecurity: No Food Insecurity (05/29/2024)  Housing: Low Risk (05/29/2024)  Transportation Needs: No Transportation Needs (05/29/2024)  Utilities: Not At Risk (05/29/2024)  Social Connections: Socially Isolated (03/05/2024)  Tobacco Use: High Risk (05/28/2024)   SDOH Interventions:      Readmission Risk Interventions    05/29/2024    1:43 PM  Readmission Risk Prevention Plan  Transportation Screening Complete  Medication Review (RN Care Manager) Complete  PCP or Specialist appointment within 3-5 days of discharge Complete  HRI or Home Care Consult Complete  SW Recovery Care/Counseling Consult Complete  Palliative Care Screening Not Applicable  Skilled Nursing Facility Not Applicable
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
## 2024-05-29 NOTE — Progress Notes (Signed)
 "       Overnight   NAME: Larry Morrison MRN: [id number] DOB : 08/17/1970    Date of Service   05/29/2024   HPI/Events of Note   HPI: 54 year old male with medical history significant for chronic kidney disease stage V on dialysis Monday Wednesday Friday, hypertension, dyslipidemia, GERD former tobacco use, anemia, TBI, malnutrition, heart failure reduced ejection fraction with left ventricle ejection fraction less than 20% as of 2022, polysubstance abuse who recently presented to the ER on 12/30 due to weakness nausea and vomiting he has improved after arrival and was discharged back to the nursing facility.  There was a suspicion of mild acute pancreatitis given mild lipase elevation but no specific etiology was identified.  Patient was discharged back to SNF.  Patient arrived to the ED via EMS1/7 due to tachypnea and shortness of breath.  He was found to have chronic pleural effusions with moderate/ stable on the left and regressed on the right since 2023 without any new acute cardiopulmonary abnormality as well as worsening cough.  Leukocytosis of 16.3 elevated neutrophils , patient denies fevers abdominal pain, and no changes in the nephrostomy tube output, however appearance of drainage is now a yellow/tan color which he reports is different for him.  Sodium was elevated at 146, creatinine 11.3, anion gap 19, LFTs improved from recent ER visit with AST down to 44 and ALT down to 35, procalcitonin was markedly elevated for the patient at 55.3.  Patient readings have been between 0.6 and 1.9 hospitalist service has been asked to evaluate patient for admission nephrology has been consulted and plans to proceed with dialysis tomorrow.    Overnight: RN notified me of persistent hypotension with maps in the 50s all night, attempted p.o. dose of midodrine  with minimal improvement in blood pressure.  Decided to proceed with administration of Levophed  given patient's history of hemodialysis, reduced ejection  fraction of less than 20%.  Physical Exam Vitals and nursing note reviewed.  HENT:     Mouth/Throat:     Mouth: Mucous membranes are dry.  Eyes:     Pupils: Pupils are equal, round, and reactive to light.  Cardiovascular:     Rate and Rhythm: Normal rate and regular rhythm.     Pulses:          Radial pulses are 2+ on the right side and 2+ on the left side.       Dorsalis pedis pulses are 2+ on the right side and 2+ on the left side.     Heart sounds: S1 normal and S2 normal.  Pulmonary:     Effort: Pulmonary effort is normal.     Breath sounds: Examination of the right-upper field reveals decreased breath sounds. Examination of the left-upper field reveals decreased breath sounds. Examination of the right-middle field reveals decreased breath sounds. Examination of the left-middle field reveals decreased breath sounds. Examination of the right-lower field reveals decreased breath sounds. Examination of the left-lower field reveals decreased breath sounds. Decreased breath sounds present.  Abdominal:     General: Abdomen is flat. Bowel sounds are normal.     Palpations: Abdomen is soft.  Skin:    General: Skin is warm and dry.     Capillary Refill: Capillary refill takes 2 to 3 seconds.  Neurological:     Mental Status: He is easily aroused. He is disoriented.     GCS: GCS eye subscore is 4. GCS verbal subscore is 4. GCS motor subscore is 6.  Motor: Weakness present.  Psychiatric:        Cognition and Memory: Cognition is impaired. Memory is impaired.       Interventions/ Plan   Attempted one-time dose of midodrine  with no improvement in blood pressure Started patient on Levophed  drip for hypotension.  Goal of MAP greater than 65    Updates 05:00 patient currently on 8 mcg/minute of Levophed  with current blood pressure of 91/54 with a MAP of 66    Ovadia Lopp Donati- Aram BSN RN CCRN AGACNP-BC Acute Care Nurse Practitioner [facility] [place]  "

## 2024-05-29 NOTE — Progress Notes (Signed)
 " [facility]  ROUNDING NOTE   Subjective:   Larry Morrison is a 54 year old male with past medical conditions including tobacco abuse, anemia, hypertension, TBI, malnutrition, combined heart failure, cocaine abuse, bilateral nephrostomy tubes and end-stage renal disease on hemodialysis.  Patient presents to the emergency department for shortness of breath and has been admitted for Shortness of breath [R06.02] General weakness [R53.1] ESRD on dialysis (HCC) [N18.6, Z99.2] Nephrostomy complication [N99.528] Sepsis (HCC) [A41.9] Elevated procalcitonin [R79.89] . Patient is known to our practice and receives outpatient dialysis treatments at [facility] on a MWF schedule, supervised by Dr. Douglas.    Patient seen at bedside in ICU Transferred to ICU due to hypotension Ill appearing Levo infusing NPO   Objective:  Vital signs in last 24 hours:  Temp:  [97.7 F (36.5 C)-98.7 F (37.1 C)] 97.7 F (36.5 C) (01/08 0755) Pulse Rate:  [55-131] 88 (01/08 0700) Resp:  [28-59] 28 (01/08 1000) BP: (69-126)/(40-80) 90/56 (01/08 1000) SpO2:  [92 %-100 %] 98 % (01/08 0647) Weight:  [48.4 kg-55.5 kg] 48.4 kg (01/08 0500)  Weight change:  Filed Weights   05/28/24 1715 05/29/24 0228 05/29/24 0500  Weight: 49.6 kg 55.5 kg 48.4 kg    Intake/Output: I/O last 3 completed shifts: In: 523 [I.V.:72.5; IV Piggyback:450.5] Out: 0    Intake/Output this shift:  Total I/O In: 86.6 [I.V.:86.6] Out: 100 [Urine:100]  Physical Exam: General: Ill appearing  Head: Normocephalic, atraumatic. Moist oral mucosal membranes  Eyes: Anicteric  Lungs:  Coarse crackles, cough  Heart: Regular rate and rhythm  Abdomen:  Soft, nontender  Extremities: 2+ peripheral edema.  Neurologic: Awake, alert, conversant  Skin: Warm,dry, no rash  Access: Left chest IJ PermCath  Bilateral nephrostomy tubes  Basic Metabolic Panel: Recent Labs  Lab 05/28/24 1007 05/29/24 0621  NA 146* 137  K 4.5  3.7  CL 100 92*  CO2 27 20*  GLUCOSE 93 89  BUN 51* 31*  CREATININE 11.30* 6.59*  CALCIUM  9.6 9.2  MG  --  1.9  PHOS  --  5.5*    Liver Function Tests: Recent Labs  Lab 05/28/24 1007 05/29/24 0621  AST 44*  --   ALT 53*  --   ALKPHOS 79  --   BILITOT 1.0  --   PROT 8.7*  --   ALBUMIN  3.6 3.1*   No results for input(s): LIPASE, AMYLASE in the last 168 hours. No results for input(s): AMMONIA in the last 168 hours.  CBC: Recent Labs  Lab 05/28/24 1007 05/29/24 0621  WBC 16.3* 15.8*  NEUTROABS 13.4*  --   HGB 9.7* 8.5*  HCT 31.1* 27.2*  MCV 102.3* 103.0*  PLT 229 233    Cardiac Enzymes: No results for input(s): CKTOTAL, CKMB, CKMBINDEX, TROPONINI in the last 168 hours.  BNP: Invalid input(s): POCBNP  CBG: No results for input(s): GLUCAP in the last 168 hours.  Microbiology: Results for orders placed or performed during the hospital encounter of 05/28/24  Resp panel by RT-PCR (RSV, Flu A&B, Covid) Anterior Nasal Swab     Status: None   Collection Time: 05/28/24 10:07 AM   Specimen: Anterior Nasal Swab  Result Value Ref Range Status   SARS Coronavirus 2 by RT PCR NEGATIVE NEGATIVE Final    Comment: (NOTE) SARS-CoV-2 target nucleic acids are NOT DETECTED.  The SARS-CoV-2 RNA is generally detectable in upper respiratory specimens during the acute phase of infection. The lowest concentration of SARS-CoV-2 viral copies this assay can detect  is 138 copies/mL. A negative result does not preclude SARS-Cov-2 infection and should not be used as the sole basis for treatment or other patient management decisions. A negative result may occur with  improper specimen collection/handling, submission of specimen other than nasopharyngeal swab, presence of viral mutation(s) within the areas targeted by this assay, and inadequate number of viral copies(<138 copies/mL). A negative result must be combined with clinical observations, patient history, and  epidemiological information. The expected result is Negative.  Fact Sheet for Patients:  bloggercourse.com  Fact Sheet for Healthcare Providers:  seriousbroker.it  This test is no t yet approved or cleared by the [place]  FDA and  has been authorized for detection and/or diagnosis of SARS-CoV-2 by FDA under an Emergency Use Authorization (EUA). This EUA will remain  in effect (meaning this test can be used) for the duration of the COVID-19 declaration under Section 564(b)(1) of the Act, 21 U.S.C.section 360bbb-3(b)(1), unless the authorization is terminated  or revoked sooner.       Influenza A by PCR NEGATIVE NEGATIVE Final   Influenza B by PCR NEGATIVE NEGATIVE Final    Comment: (NOTE) The Xpert Xpress SARS-CoV-2/FLU/RSV plus assay is intended as an aid in the diagnosis of influenza from Nasopharyngeal swab specimens and should not be used as a sole basis for treatment. Nasal washings and aspirates are unacceptable for Xpert Xpress SARS-CoV-2/FLU/RSV testing.  Fact Sheet for Patients: bloggercourse.com  Fact Sheet for Healthcare Providers: seriousbroker.it  This test is not yet approved or cleared by the [place]  FDA and has been authorized for detection and/or diagnosis of SARS-CoV-2 by FDA under an Emergency Use Authorization (EUA). This EUA will remain in effect (meaning this test can be used) for the duration of the COVID-19 declaration under Section 564(b)(1) of the Act, 21 U.S.C. section 360bbb-3(b)(1), unless the authorization is terminated or revoked.     Resp Syncytial Virus by PCR NEGATIVE NEGATIVE Final    Comment: (NOTE) Fact Sheet for Patients: bloggercourse.com  Fact Sheet for Healthcare Providers: seriousbroker.it  This test is not yet approved or cleared by the [place]  FDA and has been  authorized for detection and/or diagnosis of SARS-CoV-2 by FDA under an Emergency Use Authorization (EUA). This EUA will remain in effect (meaning this test can be used) for the duration of the COVID-19 declaration under Section 564(b)(1) of the Act, 21 U.S.C. section 360bbb-3(b)(1), unless the authorization is terminated or revoked.  Performed at [facility], [address] [place], [place] [address]   Respiratory (~20 pathogens) panel by PCR     Status: None   Collection Time: 05/28/24 10:07 AM   Specimen: Nasopharyngeal Swab; Respiratory  Result Value Ref Range Status   Adenovirus NOT DETECTED NOT DETECTED Final   Coronavirus 229E NOT DETECTED NOT DETECTED Final    Comment: (NOTE) The Coronavirus on the Respiratory Panel, DOES NOT test for the novel  Coronavirus (2019 nCoV)    Coronavirus HKU1 NOT DETECTED NOT DETECTED Final   Coronavirus NL63 NOT DETECTED NOT DETECTED Final   Coronavirus OC43 NOT DETECTED NOT DETECTED Final   Metapneumovirus NOT DETECTED NOT DETECTED Final   Rhinovirus / Enterovirus NOT DETECTED NOT DETECTED Final   Influenza A NOT DETECTED NOT DETECTED Final   Influenza B NOT DETECTED NOT DETECTED Final   Parainfluenza Virus 1 NOT DETECTED NOT DETECTED Final   Parainfluenza Virus 2 NOT DETECTED NOT DETECTED Final   Parainfluenza Virus 3 NOT DETECTED NOT DETECTED Final   Parainfluenza Virus 4  NOT DETECTED NOT DETECTED Final   Respiratory Syncytial Virus NOT DETECTED NOT DETECTED Final   Bordetella pertussis NOT DETECTED NOT DETECTED Final   Bordetella Parapertussis NOT DETECTED NOT DETECTED Final   Chlamydophila pneumoniae NOT DETECTED NOT DETECTED Final   Mycoplasma pneumoniae NOT DETECTED NOT DETECTED Final    Comment: Performed at [facility], [address], [place], [place] [address]  MRSA Next Gen by PCR, Nasal     Status: None   Collection Time: 05/28/24  9:40 PM   Specimen: Nasal Mucosa; Nasal Swab  Result Value Ref Range Status    MRSA by PCR Next Gen NOT DETECTED NOT DETECTED Final    Comment: (NOTE) The GeneXpert MRSA Assay (FDA approved for NASAL specimens only), is one component of a comprehensive MRSA colonization surveillance program. It is not intended to diagnose MRSA infection nor to guide or monitor treatment for MRSA infections. Test performance is not FDA approved in patients less than 90 years old. Performed at [facility], [address] [place], [place] [address]     Coagulation Studies: No results for input(s): LABPROT, INR in the last 72 hours.  Urinalysis: Recent Labs    05/28/24 1252  COLORURINE YELLOW*  LABSPEC 1.025  PHURINE 7.0  GLUCOSEU NEGATIVE  HGBUR SMALL*  BILIRUBINUR NEGATIVE  KETONESUR NEGATIVE  PROTEINUR 100*  NITRITE NEGATIVE  LEUKOCYTESUR MODERATE*      Imaging: DG Chest 2 View Result Date: 05/28/2024 EXAM: 2 VIEW(S) XRAY OF THE CHEST 05/28/2024 10:29:00 AM COMPARISON: Chest radiographs 09/23/2021 and earlier. CLINICAL HISTORY: 54 year old male. Due for renal replacement therapy. Worsening cough. Dialysis patient. FINDINGS: LINES, TUBES AND DEVICES: Left IJ dual lumen dialysis catheter in place with tip at cavoatrial junction. Right and left axillary vascular stents noted. Pigtail drainage catheter in place in right upper quadrant, likely retroperitoneal / renal related. LUNGS AND PLEURA: Mild pulmonary edema. Patchy airspace opacities in bilateral lower lobes. Moderate left pleural effusion appears chronic, stable since 2023. Small right pleural effusion has regressed since that time. No pneumothorax. HEART AND MEDIASTINUM: No acute abnormality of the cardiac and mediastinal silhouettes. BONES AND SOFT TISSUES: No acute osseous abnormality. IMPRESSION: 1. Chronic pleural effusions, moderate and stable on the left and regressed on the right since 2023. No new cardiopulmonary abnormality. 2. Lines, tubes, and stents as above. Electronically signed by: Helayne Hurst MD 05/28/2024 10:43 AM EST RP Workstation: [id number]     Medications:    sodium chloride       ceFAZolin  (ANCEF ) IV     norepinephrine  (LEVOPHED ) Adult infusion 8 mcg/min (05/29/24 1000)   piperacillin -tazobactam Stopped (05/29/24 0640)    aspirin  EC  81 mg Oral Daily   atorvastatin   40 mg Oral Daily   calcium  acetate  1,334 mg Oral TID WC   Chlorhexidine  Gluconate Cloth  6 each Topical Q0600   dextromethorphan -guaiFENesin   1 tablet Oral BID   feeding supplement  237 mL Oral BID BM   heparin   5,000 Units Subcutaneous Q8H   levothyroxine   50 mcg Oral Daily   [START ON 05/30/2024] lidocaine -prilocaine   1 Application Topical Once per day on Monday Wednesday Friday   multivitamin  1 tablet Oral QHS   pantoprazole   40 mg Oral Daily   sodium chloride  flush  3 mL Intravenous Q12H   vancomycin  variable dose per unstable renal function (pharmacist dosing)   Does not apply See admin instructions   acetaminophen  **OR** acetaminophen , diphenhydrAMINE , famotidine , HYDROmorphone  (DILAUDID ) injection, ipratropium-albuterol , methylPREDNISolone  (SOLU-MEDROL ) injection, midazolam ,  mouth rinse  Assessment/ Plan:  [name] is a 54 y.o.  male with past medical conditions including tobacco abuse, anemia, hypertension, TBI, malnutrition, combined heart failure, cocaine abuse, bilateral nephrostomy tubes and end-stage renal disease on hemodialysis.  Patient presents to the emergency department for shortness of breath and has been admitted for Shortness of breath [R06.02] General weakness [R53.1] ESRD on dialysis (HCC) [N18.6, Z99.2] Nephrostomy complication [N99.528] Sepsis (HCC) [A41.9] Elevated procalcitonin [R79.89]    End-stage renal disease on hemodialysis. Received dialysis yesterday, UF 0 due to hypotension. Next treatment scheduled for Friday.   2.  Sepsis of unknown origin, infection of nephrostomy tubes versus dialysis catheter.  Blood cultures pending.  Elevated white count and  leukocytosis.  Vancomycin  and Rocephin  ordered by primary team.  Primary team has ordered urine and culture from nephrostomy tube. IR planning nephrostomy exchange later today  3. Anemia of chronic kidney disease Lab Results  Component Value Date   HGB 8.5 (L) 05/29/2024    Hemoglobin decreased, will order retacrit  with dialysis tomorrow.   4. Secondary Hyperparathyroidism: with outpatient labs: PTH 438, phosphorus 5.9, calcium  8.3 on 04/28/24.   Lab Results  Component Value Date   CALCIUM  9.2 05/29/2024   CAION 0.79 (LL) 09/23/2021   PHOS 5.5 (H) 05/29/2024    Patient prescribed calcitriol  and calcium  acetate outpatient. Calcium  and phos within optimal range.   5.  Hypertension with chronic kidney disease.  Home regimen includes carvedilol  and irbesartan , both currently held.   LOS: 1 Demetria Iwai 1/8/20261:00 PM   "

## 2024-05-29 NOTE — Consult Note (Signed)
 PHARMACY CONSULT NOTE - ELECTROLYTES  Pharmacy Consult for Electrolyte Monitoring and Replacement   Recent Labs: Height: 5' 4 (162.6 cm) Weight: 48.4 kg (106 lb 11.2 oz) IBW/kg (Calculated) : 59.2 Estimated Creatinine Clearance: 8.7 mL/min (A) (by C-G formula based on SCr of 6.59 mg/dL (H)). Potassium (mmol/L)  Date Value  05/29/2024 3.7  10/22/2013 4.7   Magnesium  (mg/dL)  Date Value  98/91/7973 1.9  07/17/2012 2.2   Calcium  (mg/dL)  Date Value  98/91/7973 9.2   Calcium , Total (mg/dL)  Date Value  93/96/7984 9.3   Albumin  (g/dL)  Date Value  98/91/7973 3.1 (L)  07/17/2012 3.1 (L)   Phosphorus (mg/dL)  Date Value  98/91/7973 5.5 (H)   Sodium (mmol/L)  Date Value  05/29/2024 137  10/22/2013 138   Corrected Ca: 9.9 mg/dL  Assessment  Larry Morrison is a 54 y.o. male presenting with sepsis. PMH significant for ESRD HD MWF, HFrEF, HTN, HLD, hypothyroidism. Pharmacy has been consulted to monitor and replace electrolytes.  Diet: NPO MIVF: None Pertinent medications: Calcium  acetate with meals  Goal of Therapy: Electrolytes WNL  Plan:  K -- 3.7; WNL, no repletion needed Mg -- 1.9; WNL, no repletion needed Phos -- 5.5; High, no correction indicated Check BMP, Mg, Phos with AM labs  Thank you for allowing pharmacy to be a part of this patient's care.  Belvie Macintosh, PharmD Candidate 05/29/2024 9:49 AM

## 2024-05-29 NOTE — ED Notes (Signed)
 EDT Travaris Kosh, RN Dorian, & RN Maegan changed pt brief, gave bedbath, and changed bandage. Then Pt was transformed onto a hospital bed from stretcher. All loose items were removed. Pt placed in comfortable position per pt.

## 2024-05-30 ENCOUNTER — Inpatient Hospital Stay: Admit: 2024-05-30 | Discharge: 2024-05-30 | Disposition: A

## 2024-05-30 DIAGNOSIS — I5042 Chronic combined systolic (congestive) and diastolic (congestive) heart failure: Secondary | ICD-10-CM

## 2024-05-30 LAB — RENAL FUNCTION PANEL
Albumin: 3.1 g/dL — ABNORMAL LOW (ref 3.5–5.0)
Anion gap: 22 — ABNORMAL HIGH (ref 5–15)
BUN: 47 mg/dL — ABNORMAL HIGH (ref 6–20)
CO2: 24 mmol/L (ref 22–32)
Calcium: 8.9 mg/dL (ref 8.9–10.3)
Chloride: 91 mmol/L — ABNORMAL LOW (ref 98–111)
Creatinine, Ser: 9.36 mg/dL — ABNORMAL HIGH (ref 0.61–1.24)
GFR, Estimated: 6 mL/min — ABNORMAL LOW
Glucose, Bld: 116 mg/dL — ABNORMAL HIGH (ref 70–99)
Phosphorus: 5 mg/dL — ABNORMAL HIGH (ref 2.5–4.6)
Potassium: 3.6 mmol/L (ref 3.5–5.1)
Sodium: 137 mmol/L (ref 135–145)

## 2024-05-30 LAB — CBC
HCT: 26.1 % — ABNORMAL LOW (ref 39.0–52.0)
Hemoglobin: 8.3 g/dL — ABNORMAL LOW (ref 13.0–17.0)
MCH: 32.5 pg (ref 26.0–34.0)
MCHC: 31.8 g/dL (ref 30.0–36.0)
MCV: 102.4 fL — ABNORMAL HIGH (ref 80.0–100.0)
Platelets: 207 K/uL (ref 150–400)
RBC: 2.55 MIL/uL — ABNORMAL LOW (ref 4.22–5.81)
RDW: 13.9 % (ref 11.5–15.5)
WBC: 15.1 K/uL — ABNORMAL HIGH (ref 4.0–10.5)
nRBC: 0 % (ref 0.0–0.2)

## 2024-05-30 LAB — ECHOCARDIOGRAM COMPLETE
AR max vel: 1.81 cm2
AV Area VTI: 1.92 cm2
AV Area mean vel: 1.7 cm2
AV Mean grad: 3 mmHg
AV Peak grad: 5 mmHg
Ao pk vel: 1.12 m/s
Area-P 1/2: 3.17 cm2
Height: 64 in
MV VTI: 1.72 cm2
S' Lateral: 2.9 cm
Weight: 1746.04 [oz_av]

## 2024-05-30 LAB — MAGNESIUM: Magnesium: 1.9 mg/dL (ref 1.7–2.4)

## 2024-05-30 MED ORDER — LIDOCAINE-PRILOCAINE 2.5-2.5 % EX CREA: 1.0000 | TOPICAL_CREAM | CUTANEOUS | Status: DC | PRN

## 2024-05-30 MED ORDER — ALTEPLASE 2 MG IJ SOLR: 2.0000 mg | Freq: Once | INTRAMUSCULAR | Status: DC | PRN

## 2024-05-30 MED ORDER — HEPARIN SODIUM (PORCINE) 1000 UNIT/ML DIALYSIS: 1000.0000 [IU] | INTRAMUSCULAR | Status: DC | PRN

## 2024-05-30 MED ORDER — PENTAFLUOROPROP-TETRAFLUOROETH EX AERO: 1.0000 | INHALATION_SPRAY | CUTANEOUS | Status: DC | PRN

## 2024-05-30 MED ORDER — HEPARIN SODIUM (PORCINE) 1000 UNIT/ML IJ SOLN
INTRAMUSCULAR | Status: AC
  Filled 2024-05-30: qty 6

## 2024-05-30 MED ORDER — VANCOMYCIN HCL 500 MG/100ML IV SOLN
500.0000 mg | Freq: Once | INTRAVENOUS | Status: AC
  Administered 2024-05-30: 500 mg via INTRAVENOUS
  Filled 2024-05-30: qty 100

## 2024-05-30 MED ORDER — EPOETIN ALFA-EPBX 10000 UNIT/ML IJ SOLN
10000.0000 [IU] | INTRAMUSCULAR | Status: DC
  Administered 2024-05-30 – 2024-06-06 (×4): 10000 [IU] via INTRAVENOUS
  Filled 2024-05-30 (×3): qty 1

## 2024-05-30 NOTE — Progress Notes (Signed)
 " Progress Note   Patient: Larry Morrison FMW:[id number] DOB: May 23, 1970 DOA: 05/28/2024     2 DOS: the patient was seen and examined on 05/30/2024   Brief hospital course: Per H&P HPI   HPI: Larry Morrison is a 54 y.o. 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  Patient was recently seen in the ED on 12/30 with weakness and nausea and vomiting.  No definitive cause was elucidated and patient improved after arrival and he was discharged back to the nursing facility.  It was felt he may have had mild acute pancreatitis due to the mild elevation in lipase.  He was sent back to the ED today because of tachypnea and reports of shortness of breath.  He is due dialysis today.  2 view chest x-ray today revealed chronic pleural effusions moderate and stable on the left and regressed on the right since 2023 without any new acute cardiopulmonary abnormality.  Patient was noted to have wet sounding nonproductive cough.  At presentation temperature was 98.8, he was tachypneic and tachycardic with heart rates in the 110s range, O2 sats have been between 92 and 97% with notable increased work of breathing.  He was normotensive.  Labs revealed leukocytosis with a white count of 16,300, elevated neutrophils, right sided nephrostomy tube drainage is cloudy with yellow-tan appearance.  Patient states this is not normal for his usual output.  Sodium was slightly elevated at 146, creatinine 11.3, anion gap 19, LFTs improved from recent ER visit with AST down to 44 and ALT down to 53, procalcitonin was markedly elevated for this patient at 55.3.  Previous readings have been between 0.6 and 1.92.  Hospitalist service has been asked to evaluate the patient for admission.  Nephrology has been  consulted and plans are to proceed with dialysis today.   Assessment and Plan: Sepsis of unclear etiology  Infected nephrostomy tube versus infected  HD catheter Sepsis POA leukocytosis, tachypnea, purulent drainage in nephrostomy bag.   Patient continues to require vasopressor support.  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  Will continue to monitor will likely need outpatient follow up for this.    HLD Continue lipitor    Hypothyroidism  Continue synthroid    Protein calorie malnutrition severe BMI is 22.26 but patient looks very malnourished with the appearance of severe muscle wasting Continue protein shakes 2 times daily     Subjective: Patient having some abdominal pain near insertion of nephrostomy tube.  Otherwise no acute complaints.  Physical Exam: Vitals:   05/30/24 1304 05/30/24 1347 05/30/24 1400 05/30/24 1431  BP:  90/60 (!) 88/62 105/67  Pulse:  82 82 92  Resp: (!) 42 (!) 30 (!) 27 18  Temp:      TempSrc:      SpO2:  100% 99% 100%  Weight:      Height:       Physical Exam  Constitutional: In no distress.  Thin chronically ill-appearing Cardiovascular: Normal rate, regular rhythm. No lower extremity  edema  Pulmonary: Non labored breathing on [place], no wheezing or rales.   Abdominal: Soft. Non distended and non tender Neurological: Alert and oriented to person, place, and time. Non focal  Skin: Skin is warm and dry.  No purulent drainage at insertion site of neph tube, no erythema of surrounding skin.   Data Reviewed:     Latest Ref Rng & Units 05/30/2024    3:30 AM 05/29/2024    6:21 AM 05/28/2024   10:07 AM  BMP  Glucose 70 -  99 mg/dL 883  89  93   BUN 6 - 20 mg/dL 47  31  51   Creatinine 0.61 - 1.24 mg/dL 0.63  3.40  88.69   Sodium 135 - 145 mmol/L 137  137  146   Potassium 3.5 - 5.1 mmol/L 3.6  3.7  4.5   Chloride 98 - 111 mmol/L 91  92  100   CO2 22 - 32 mmol/L 24  20  27    Calcium  8.9 - 10.3 mg/dL 8.9  9.2  9.6       Latest Ref Rng & Units 05/30/2024    3:30 AM 05/29/2024    6:21 AM 05/28/2024   10:07 AM  CBC  WBC 4.0 - 10.5 K/uL 15.1  15.8  16.3   Hemoglobin 13.0 - 17.0 g/dL 8.3  8.5  9.7   Hematocrit 39.0 - 52.0 % 26.1  27.2  31.1   Platelets 150 - 400 K/uL 207  233  229      Family Communication: None at bedside   Disposition: Status is: Inpatient Remains inpatient appropriate because: remains hypotensive requring vasopressor support.   Planned Discharge Destination: Pending clinical course     Time spent: 35 minutes  Author: Alban Pepper, MD 05/30/2024 4:10 PM  For on call review [email or web address].  "

## 2024-05-30 NOTE — Progress Notes (Signed)
 " [facility]  ROUNDING NOTE   Subjective:   Larry Morrison is a 54 year old male with past medical conditions including tobacco abuse, anemia, hypertension, TBI, malnutrition, combined heart failure, cocaine abuse, bilateral nephrostomy tubes and end-stage renal disease on hemodialysis.  Patient presents to the emergency department for shortness of breath and has been admitted for Shortness of breath [R06.02] General weakness [R53.1] ESRD on dialysis (HCC) [N18.6, Z99.2] Nephrostomy complication [N99.528] Sepsis (HCC) [A41.9] Elevated procalcitonin [R79.89] . Patient is known to our practice and receives outpatient dialysis treatments at [facility] on a MWF schedule, supervised by Dr. Douglas.    Patient seen at bedside in ICU Evaluated during hemodialysis   HEMODIALYSIS FLOWSHEET:  Blood Flow Rate (mL/min): 399 mL/min Arterial Pressure (mmHg): -208.27 mmHg Venous Pressure (mmHg): 167.46 mmHg TMP (mmHg): 3.43 mmHg Ultrafiltration Rate (mL/min): 402 mL/min Dialysate Flow Rate (mL/min): 300 ml/min Dialysis Fluid Bolus: Normal Saline Bolus Amount (mL): 100 mL  Resting comfortably NPO awaiting IR procedure    Objective:  Vital signs in last 24 hours:  Temp:  [97.5 F (36.4 C)-98.4 F (36.9 C)] 97.7 F (36.5 C) (01/09 0801) Pulse Rate:  [65-99] 87 (01/09 1115) Resp:  [21-51] 39 (01/09 1115) BP: (82-134)/(51-86) 105/65 (01/09 1115) SpO2:  [97 %-100 %] 97 % (01/09 1115) Weight:  [48.7 kg-49.5 kg] 49.5 kg (01/09 0801)  Weight change: -8.3 kg Filed Weights   05/29/24 0500 05/30/24 0340 05/30/24 0801  Weight: 48.4 kg 48.7 kg 49.5 kg    Intake/Output: I/O last 3 completed shifts: In: 1520.1 [P.O.:240; I.V.:729.7; IV Piggyback:550.5] Out: 100 [Urine:100]   Intake/Output this shift:  No intake/output data recorded.  Physical Exam: General: Ill appearing  Head: Normocephalic, atraumatic. Moist oral mucosal membranes  Eyes: Anicteric  Lungs:  Coarse  crackles, cough, tachypnea   Heart: Regular rate and rhythm  Abdomen:  Soft, nontender  Extremities: 2+ peripheral edema.  Neurologic: Awake, alert, conversant  Skin: Warm,dry, no rash  Access: Left chest IJ PermCath  Bilateral nephrostomy tubes  Basic Metabolic Panel: Recent Labs  Lab 05/28/24 1007 05/29/24 0621 05/30/24 0330  NA 146* 137 137  K 4.5 3.7 3.6  CL 100 92* 91*  CO2 27 20* 24  GLUCOSE 93 89 116*  BUN 51* 31* 47*  CREATININE 11.30* 6.59* 9.36*  CALCIUM  9.6 9.2 8.9  MG  --  1.9 1.9  PHOS  --  5.5* 5.0*    Liver Function Tests: Recent Labs  Lab 05/28/24 1007 05/29/24 0621 05/30/24 0330  AST 44*  --   --   ALT 53*  --   --   ALKPHOS 79  --   --   BILITOT 1.0  --   --   PROT 8.7*  --   --   ALBUMIN  3.6 3.1* 3.1*   No results for input(s): LIPASE, AMYLASE in the last 168 hours. No results for input(s): AMMONIA in the last 168 hours.  CBC: Recent Labs  Lab 05/28/24 1007 05/29/24 0621 05/30/24 0330  WBC 16.3* 15.8* 15.1*  NEUTROABS 13.4*  --   --   HGB 9.7* 8.5* 8.3*  HCT 31.1* 27.2* 26.1*  MCV 102.3* 103.0* 102.4*  PLT 229 233 207    Cardiac Enzymes: No results for input(s): CKTOTAL, CKMB, CKMBINDEX, TROPONINI in the last 168 hours.  BNP: Invalid input(s): POCBNP  CBG: Recent Labs  Lab 05/29/24 0459  GLUCAP 90    Microbiology: Results for orders placed or performed during the hospital encounter of 05/28/24  Resp panel by RT-PCR (RSV, Flu A&B, Covid) Anterior Nasal Swab     Status: None   Collection Time: 05/28/24 10:07 AM   Specimen: Anterior Nasal Swab  Result Value Ref Range Status   SARS Coronavirus 2 by RT PCR NEGATIVE NEGATIVE Final    Comment: (NOTE) SARS-CoV-2 target nucleic acids are NOT DETECTED.  The SARS-CoV-2 RNA is generally detectable in upper respiratory specimens during the acute phase of infection. The lowest concentration of SARS-CoV-2 viral copies this assay can detect is 138 copies/mL. A  negative result does not preclude SARS-Cov-2 infection and should not be used as the sole basis for treatment or other patient management decisions. A negative result may occur with  improper specimen collection/handling, submission of specimen other than nasopharyngeal swab, presence of viral mutation(s) within the areas targeted by this assay, and inadequate number of viral copies(<138 copies/mL). A negative result must be combined with clinical observations, patient history, and epidemiological information. The expected result is Negative.  Fact Sheet for Patients:  bloggercourse.com  Fact Sheet for Healthcare Providers:  seriousbroker.it  This test is no t yet approved or cleared by the [place]  FDA and  has been authorized for detection and/or diagnosis of SARS-CoV-2 by FDA under an Emergency Use Authorization (EUA). This EUA will remain  in effect (meaning this test can be used) for the duration of the COVID-19 declaration under Section 564(b)(1) of the Act, 21 U.S.C.section 360bbb-3(b)(1), unless the authorization is terminated  or revoked sooner.       Influenza A by PCR NEGATIVE NEGATIVE Final   Influenza B by PCR NEGATIVE NEGATIVE Final    Comment: (NOTE) The Xpert Xpress SARS-CoV-2/FLU/RSV plus assay is intended as an aid in the diagnosis of influenza from Nasopharyngeal swab specimens and should not be used as a sole basis for treatment. Nasal washings and aspirates are unacceptable for Xpert Xpress SARS-CoV-2/FLU/RSV testing.  Fact Sheet for Patients: bloggercourse.com  Fact Sheet for Healthcare Providers: seriousbroker.it  This test is not yet approved or cleared by the [place]  FDA and has been authorized for detection and/or diagnosis of SARS-CoV-2 by FDA under an Emergency Use Authorization (EUA). This EUA will remain in effect (meaning this test can  be used) for the duration of the COVID-19 declaration under Section 564(b)(1) of the Act, 21 U.S.C. section 360bbb-3(b)(1), unless the authorization is terminated or revoked.     Resp Syncytial Virus by PCR NEGATIVE NEGATIVE Final    Comment: (NOTE) Fact Sheet for Patients: bloggercourse.com  Fact Sheet for Healthcare Providers: seriousbroker.it  This test is not yet approved or cleared by the [place]  FDA and has been authorized for detection and/or diagnosis of SARS-CoV-2 by FDA under an Emergency Use Authorization (EUA). This EUA will remain in effect (meaning this test can be used) for the duration of the COVID-19 declaration under Section 564(b)(1) of the Act, 21 U.S.C. section 360bbb-3(b)(1), unless the authorization is terminated or revoked.  Performed at [facility], [address] [place], [place] [address]   Respiratory (~20 pathogens) panel by PCR     Status: None   Collection Time: 05/28/24 10:07 AM   Specimen: Nasopharyngeal Swab; Respiratory  Result Value Ref Range Status   Adenovirus NOT DETECTED NOT DETECTED Final   Coronavirus 229E NOT DETECTED NOT DETECTED Final    Comment: (NOTE) The Coronavirus on the Respiratory Panel, DOES NOT test for the novel  Coronavirus (2019 nCoV)    Coronavirus HKU1 NOT DETECTED NOT DETECTED Final  Coronavirus NL63 NOT DETECTED NOT DETECTED Final   Coronavirus OC43 NOT DETECTED NOT DETECTED Final   Metapneumovirus NOT DETECTED NOT DETECTED Final   Rhinovirus / Enterovirus NOT DETECTED NOT DETECTED Final   Influenza A NOT DETECTED NOT DETECTED Final   Influenza B NOT DETECTED NOT DETECTED Final   Parainfluenza Virus 1 NOT DETECTED NOT DETECTED Final   Parainfluenza Virus 2 NOT DETECTED NOT DETECTED Final   Parainfluenza Virus 3 NOT DETECTED NOT DETECTED Final   Parainfluenza Virus 4 NOT DETECTED NOT DETECTED Final   Respiratory Syncytial Virus NOT DETECTED NOT  DETECTED Final   Bordetella pertussis NOT DETECTED NOT DETECTED Final   Bordetella Parapertussis NOT DETECTED NOT DETECTED Final   Chlamydophila pneumoniae NOT DETECTED NOT DETECTED Final   Mycoplasma pneumoniae NOT DETECTED NOT DETECTED Final    Comment: Performed at [facility], [address], [place], [place] [address]  Blood culture (routine x 2)     Status: None (Preliminary result)   Collection Time: 05/28/24 12:52 PM   Specimen: BLOOD  Result Value Ref Range Status   Specimen Description BLOOD RIGHT ANTECUBITAL  Final   Special Requests   Final    BOTTLES DRAWN AEROBIC AND ANAEROBIC Blood Culture results may not be optimal due to an inadequate volume of blood received in culture bottles   Culture   Final    NO GROWTH 2 DAYS Performed at [facility], [address], [place], [place] [address]    Report Status PENDING  Incomplete  Urine Culture     Status: Abnormal (Preliminary result)   Collection Time: 05/28/24 12:52 PM   Specimen: Urine, Catheterized  Result Value Ref Range Status   Specimen Description   Final    URINE, CATHETERIZED Performed at [facility], [address] [place], [place] [address]    Special Requests   Final    NONE Performed at [facility], [address], [place], [place] [address]    Culture (A)  Final    >=100,000 COLONIES/mL ESCHERICHIA COLI >=100,000 COLONIES/mL GRAM NEGATIVE RODS    Report Status PENDING  Incomplete  Expectorated Sputum Assessment w Gram Stain, Rflx to Resp Cult     Status: None   Collection Time: 05/28/24  1:18 PM   Specimen: Salivary Gland; Sputum  Result Value Ref Range Status   Specimen Description SALIVA  Final   Special Requests EXPSU  Final   Sputum evaluation   Final    THIS SPECIMEN IS ACCEPTABLE FOR SPUTUM CULTURE Performed at [facility], [address] [place], [place] [address]    Report Status 05/29/2024 FINAL  Final  Culture, Respiratory w Gram Stain      Status: None (Preliminary result)   Collection Time: 05/28/24  1:18 PM  Result Value Ref Range Status   Specimen Description EXPECTORATED SPUTUM  Final   Special Requests Reflexed from [id number]  Final   Gram Stain   Final    FEW WBC PRESENT, PREDOMINANTLY PMN RARE GRAM NEGATIVE RODS Performed at [facility], [address], [place], [place] [address]    Culture PENDING  Incomplete   Report Status PENDING  Incomplete  MRSA Next Gen by PCR, Nasal     Status: None   Collection Time: 05/28/24  9:40 PM   Specimen: Nasal Mucosa; Nasal Swab  Result Value Ref Range Status   MRSA by PCR Next Gen NOT DETECTED NOT DETECTED Final    Comment: (NOTE) The GeneXpert  MRSA Assay (FDA approved for NASAL specimens only), is one component of a comprehensive MRSA colonization surveillance program. It is not intended to diagnose MRSA infection nor to guide or monitor treatment for MRSA infections. Test performance is not FDA approved in patients less than 18 years old. Performed at [facility], [address] [place], [place] [address]   Blood culture (routine x 2)     Status: None (Preliminary result)   Collection Time: 05/28/24 10:24 PM   Specimen: BLOOD  Result Value Ref Range Status   Specimen Description BLOOD BLOOD RIGHT HAND  Final   Special Requests   Final    BOTTLES DRAWN AEROBIC ONLY Blood Culture results may not be optimal due to an inadequate volume of blood received in culture bottles   Culture   Final    NO GROWTH 2 DAYS Performed at [facility], [address], [place], [place] [address]    Report Status PENDING  Incomplete  Body fluid culture w Gram Stain     Status: None (Preliminary result)   Collection Time: 05/29/24  8:00 AM   Specimen: Other Source; Body Fluid  Result Value Ref Range Status   Specimen Description   Final    OTHER Performed at [facility], [address] [place], [place] [address]    Special Requests   Final     NONE Performed at [facility], [address] [place], [place] [address]    Gram Stain   Final    FEW WBC PRESENT, PREDOMINANTLY PMN MODERATE GRAM POSITIVE COCCI FEW GRAM NEGATIVE RODS Performed at [facility], [address] [place], [place] [address]    Culture PENDING  Incomplete   Report Status PENDING  Incomplete    Coagulation Studies: No results for input(s): LABPROT, INR in the last 72 hours.  Urinalysis: Recent Labs    05/28/24 1252  COLORURINE YELLOW*  LABSPEC 1.025  PHURINE 7.0  GLUCOSEU NEGATIVE  HGBUR SMALL*  BILIRUBINUR NEGATIVE  KETONESUR NEGATIVE  PROTEINUR 100*  NITRITE NEGATIVE  LEUKOCYTESUR MODERATE*      Imaging: No results found.    Medications:    sodium chloride  5 mL/hr at 05/30/24 0400    ceFAZolin  (ANCEF ) IV     norepinephrine  (LEVOPHED ) Adult infusion 3 mcg/min (05/30/24 0400)   piperacillin -tazobactam 2.25 g (05/30/24 0514)   vancomycin       aspirin  EC  81 mg Oral Daily   atorvastatin   40 mg Oral Daily   calcium  acetate  1,334 mg Oral TID WC   Chlorhexidine  Gluconate Cloth  6 each Topical Q0600   dextromethorphan -guaiFENesin   1 tablet Oral BID   feeding supplement  237 mL Oral BID BM   heparin   5,000 Units Subcutaneous Q8H   levothyroxine   50 mcg Oral Daily   lidocaine -prilocaine   1 Application Topical Once per day on Monday Wednesday Friday   multivitamin  1 tablet Oral QHS   pantoprazole   40 mg Oral Daily   sodium chloride  flush  3 mL Intravenous Q12H   vancomycin  variable dose per unstable renal function (pharmacist dosing)   Does not apply See admin instructions   acetaminophen  **OR** acetaminophen , alteplase , alteplase , alteplase , alteplase , diphenhydrAMINE , famotidine , heparin , heparin , heparin , heparin , heparin , HYDROmorphone  (DILAUDID ) injection, ipratropium-albuterol , lidocaine -prilocaine , methylPREDNISolone  (SOLU-MEDROL ) injection, midazolam , mouth rinse,  pentafluoroprop-tetrafluoroeth  Assessment/ Plan:  Larry Morrison is a 54 y.o.  male with past medical conditions including tobacco abuse, anemia, hypertension, TBI, malnutrition, combined heart failure, cocaine abuse, bilateral  nephrostomy tubes and end-stage renal disease on hemodialysis.  Patient presents to the emergency department for shortness of breath and has been admitted for Shortness of breath [R06.02] General weakness [R53.1] ESRD on dialysis (HCC) [N18.6, Z99.2] Nephrostomy complication [N99.528] Sepsis (HCC) [A41.9] Elevated procalcitonin [R79.89]    End-stage renal disease on hemodialysis. Receiving dialysis today, UF 0. Will schedule next treatment for Monday.   2.  Sepsis of unknown origin, infection of nephrostomy tubes versus dialysis catheter.  Blood cultures pending.  Elevated white count and leukocytosis.  Vancomycin  and Rocephin  ordered by primary team.  Primary team has ordered urine and culture from nephrostomy tube. IR planning nephrostomy exchange today.  3. Anemia of chronic kidney disease Lab Results  Component Value Date   HGB 8.3 (L) 05/30/2024    Hemoglobin decreased, will order retacrit  10000 units IV with dialysis today  4. Secondary Hyperparathyroidism: with outpatient labs: PTH 438, phosphorus 5.9, calcium  8.3 on 04/28/24.   Lab Results  Component Value Date   CALCIUM  8.9 05/30/2024   CAION 0.79 (LL) 09/23/2021   PHOS 5.0 (H) 05/30/2024    Patient prescribed calcitriol  and calcium  acetate outpatient. Will continue to monitor.   5.  Hypotensive with history of hypertension with chronic kidney disease.  Home regimen includes carvedilol  and irbesartan , both currently held. Placed on Levophed .    LOS: 2 Haskell Rihn 1/9/202611:37 AM   "

## 2024-05-30 NOTE — Progress Notes (Signed)
 Bedside ICU txd  Informed consent signed and in chart.    TX duration: 3.5hrs     Hand-off given to patient's nurse. No acute distress noted  no c/o   Access used: L HD permcath Access issues: none   Total UF removed: none Medication(s) given: retacrit  Post HD VS: wnl      Olivia Hurst LPN [facility]

## 2024-05-30 NOTE — Consult Note (Addendum)
 Pharmacy Antibiotic Note  Larry Morrison is a 54 y.o. male with PMH of hemodialysis MWF admitted on 05/28/2024 with a nephrostomy tube infection. Right sided nephrostomy tube drainage is cloudy with yellow-tan appearance  Pharmacy has been consulted for vancomycin  dosing.  S/p Vancomycin  1500 mg LD x1  Patient receiving dialysis this morning, give vancomycin  500 mg IV x 1 later this afternoon Continue to follow vancomycin  variable dosing protocol  Follow nephrology for HD plans and schedule doses post HD session.  Follow culture data and de-escalate when able  Height: 5' 4 (162.6 cm) Weight: 49.5 kg (109 lb 2 oz) IBW/kg (Calculated) : 59.2  Temp (24hrs), Avg:97.8 F (36.6 C), Min:97.5 F (36.4 C), Max:98.4 F (36.9 C)  Recent Labs  Lab 05/28/24 1007 05/28/24 1252 05/29/24 0621 05/30/24 0330  WBC 16.3*  --  15.8* 15.1*  CREATININE 11.30*  --  6.59* 9.36*  LATICACIDVEN  --  1.9 1.1  --     Estimated Creatinine Clearance: 6.2 mL/min (A) (by C-G formula based on SCr of 9.36 mg/dL (H)).    Allergies[1]  Antimicrobials this admission: 1/8 Zosyn >> 1/7 rocephin  >> 1/8  1/7 vancomycin  >>    Microbiology results: 1/7 BCx: NGTD 1/7 UCx: >100,000 CFU GNR ; E coli 1/8 Fluid culture: moderate GPC, few GNR   Thank you for allowing pharmacy to be a part of this patients care.  Lum VEAR Mania, PharmD Clinical Pharmacist 05/30/2024 9:30 AM       [1] No Known Allergies

## 2024-05-30 NOTE — Progress Notes (Signed)
 Patient rested most of the shift- patient refused all medications and eating- also refused to be repositioned in bed.  Dr. Franchot made aware.

## 2024-05-31 LAB — CBC WITH DIFFERENTIAL/PLATELET
Abs Immature Granulocytes: 0.11 K/uL — ABNORMAL HIGH (ref 0.00–0.07)
Basophils Absolute: 0.1 K/uL (ref 0.0–0.1)
Basophils Relative: 1 %
Eosinophils Absolute: 0.2 K/uL (ref 0.0–0.5)
Eosinophils Relative: 2 %
HCT: 26.4 % — ABNORMAL LOW (ref 39.0–52.0)
Hemoglobin: 8.1 g/dL — ABNORMAL LOW (ref 13.0–17.0)
Immature Granulocytes: 1 %
Lymphocytes Relative: 9 %
Lymphs Abs: 1.1 K/uL (ref 0.7–4.0)
MCH: 31.6 pg (ref 26.0–34.0)
MCHC: 30.7 g/dL (ref 30.0–36.0)
MCV: 103.1 fL — ABNORMAL HIGH (ref 80.0–100.0)
Monocytes Absolute: 1.4 K/uL — ABNORMAL HIGH (ref 0.1–1.0)
Monocytes Relative: 11 %
Neutro Abs: 9.9 K/uL — ABNORMAL HIGH (ref 1.7–7.7)
Neutrophils Relative %: 76 %
Platelets: 200 K/uL (ref 150–400)
RBC: 2.56 MIL/uL — ABNORMAL LOW (ref 4.22–5.81)
RDW: 13.9 % (ref 11.5–15.5)
WBC: 12.7 K/uL — ABNORMAL HIGH (ref 4.0–10.5)
nRBC: 0 % (ref 0.0–0.2)

## 2024-05-31 LAB — RENAL FUNCTION PANEL
Albumin: 3 g/dL — ABNORMAL LOW (ref 3.5–5.0)
Anion gap: 18 — ABNORMAL HIGH (ref 5–15)
BUN: 20 mg/dL (ref 6–20)
CO2: 23 mmol/L (ref 22–32)
Calcium: 8.7 mg/dL — ABNORMAL LOW (ref 8.9–10.3)
Chloride: 93 mmol/L — ABNORMAL LOW (ref 98–111)
Creatinine, Ser: 5.46 mg/dL — ABNORMAL HIGH (ref 0.61–1.24)
GFR, Estimated: 12 mL/min — ABNORMAL LOW
Glucose, Bld: 88 mg/dL (ref 70–99)
Phosphorus: 3.7 mg/dL (ref 2.5–4.6)
Potassium: 3.6 mmol/L (ref 3.5–5.1)
Sodium: 134 mmol/L — ABNORMAL LOW (ref 135–145)

## 2024-05-31 LAB — URINE CULTURE: Culture: >=100000 — AB

## 2024-05-31 LAB — GLUCOSE, CAPILLARY: Glucose-Capillary: 96 mg/dL (ref 70–99)

## 2024-05-31 LAB — PROCALCITONIN: Procalcitonin: 40.1 ng/mL

## 2024-05-31 MED ORDER — VANCOMYCIN HCL 500 MG/100ML IV SOLN: 500.0000 mg | INTRAVENOUS | Status: DC

## 2024-05-31 MED ORDER — ORAL CARE MOUTH RINSE: 15.0000 mL | OROMUCOSAL | Status: DC | PRN

## 2024-05-31 MED ORDER — SODIUM CHLORIDE 0.9 % IV SOLN
1.0000 g | INTRAVENOUS | Status: DC
  Administered 2024-05-31 – 2024-06-03 (×4): 1 g via INTRAVENOUS
  Filled 2024-05-31 (×4): qty 10

## 2024-05-31 MED ORDER — ALBUMIN HUMAN 25 % IV SOLN
12.5000 g | Freq: Once | INTRAVENOUS | Status: AC
  Administered 2024-05-31: 12.5 g via INTRAVENOUS
  Filled 2024-05-31: qty 50

## 2024-05-31 MED ORDER — ORAL CARE MOUTH RINSE
15.0000 mL | OROMUCOSAL | Status: DC
  Administered 2024-05-31 – 2024-06-03 (×9): 15 mL via OROMUCOSAL

## 2024-05-31 MED ORDER — MIDODRINE HCL 5 MG PO TABS
5.0000 mg | ORAL_TABLET | Freq: Three times a day (TID) | ORAL | Status: DC
  Administered 2024-05-31 – 2024-06-01 (×3): 5 mg via ORAL
  Filled 2024-05-31 (×3): qty 1

## 2024-05-31 NOTE — Progress Notes (Signed)
 "  NAME:  Larry Morrison, MRN:  [id number], DOB:  06/14/1970, LOS: 3 ADMISSION DATE:  05/28/2024  CONSULTATION DATE:  05/29/2024 REFERRING MD:  Dr. 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  He recently presented to [facility] ED on 12/30 due to weakness, nausea and vomiting. He improved after arrival and was discharged back to the SNF he resides at. There was suspicion of mild acute pancreatitis given mild lipase elevation but no specific etiology was identified.   History gathered via chart review and evaluation at bedside He presented from his nursing home via EMS to [facility] ED on 05/28/24 due to tachypnea and of shortness of breath. The patient endorsed his shortness of breath had been present for approximately 24 hours, along with a worsening, wet cough. At the time of evaluation in the ED, he denied fevers and abdominal pain. His last dialysis session was on Monday and he was due for dialysis per his normal dialysis schedule on arrival. It was noted that his right nephrostomy tube output was a tan/yellow color, different from his baseline output appearance.   ED Course   Vitals on Arrival: Temp 98.36F, BP 11/69, HR 113, RR 22, SpO2 95%   Pertinent Labs/Diagnostics Chemistry:  Na+:146, K+: 4.5, BUN/Cr.: 51/11.3, Serum CO2/ AG: 27/19, AST/ALT: 44/53 Hematology:  WBC: 16.3, Hgb: 9.7, Platelets 229 BNP: pending Lactic/ PCT: 1.9/55.3 COVID-19 & Influenza A/B: negative MRSA PCR: negative Blood cultures x 2 drawn Respiratory Viral Panel (20 pathogen): negative UA: turbid appearance, small Hgb and moderate leukocytes Urine culture ordered CXR 05/28/24: chronic  pleural effusions; stable on the left and regressed on the right without any acute cardiopulmonary process.   Medications Administered Ceftriaxone  x 1 Vancomycin    The patient was evaluated by the hospitalist service for admission and nephrology was consulted with plans for dialysis.    Overnight the patient was noted to have persistent hypotension. His MAPs were in the mid 50's to mid 60's. He was administered 5mg  of midodrine  with very minimal improvement in blood pressure. Given his heart failure history, higher doses were not attempted.    PCCM consulted for admission due to hypotension and was started on a levophed  gtt.     Pertinent Medical History  Hypertension Pulmonary Hypertension HFrEF Cardiomyopathy ESRD on hemodialysis History of left Nephrectomy Right sided Nephrostomy Stroke Tobacco abuse Protein Calorie Malnutrition Polysubstance abuse Neurogenic Bladder with chronic indwelling foley catheter Anemia of Chronic Disease   Significant Hospital Events: Including procedures, antibiotic start and stop dates in addition to other pertinent events   05/28/24: Presented to [facility] ED with concern for shortness of breath. Was not found to be overly hypoxic; stable on 4L nasal cannula. Admitted for sepsis workup with possible source being right nephrostomy tube. Nephrology consulted with plan for hemodialysis. 05/29/24: Did not complete dialysis d/t progressively worsening hypotension requiring PCCM consult for levophed  gtt. SpO2 stable on 4L nasal cannula. Started on Zosyn  and Vanc.    Interim History / Subjective:  Patient became progressively hypotensive throughout the evening, MAPs in the high 50's - mid 60's. Given hypotension he was unable to complete dialysis. Midodrine  5mg  was administered with some improvement but hypotension persisted.  Levophed  gtt started. Patient is refusing all po meds and he is not eating. He gets feisty when asked why he is not taking his meds.  Review  of Systems:   Review of Systems  Constitutional:  Positive for malaise/fatigue. Negative for chills and fever.  HENT:  Negative for sore throat.   Eyes:  Negative for discharge.  Respiratory:  Positive for shortness of breath. Negative for cough and wheezing.   Cardiovascular:  Positive for leg swelling. Negative for chest pain and palpitations.  Gastrointestinal:  Negative for abdominal pain, diarrhea, heartburn, nausea and vomiting.  Musculoskeletal:  Negative for myalgias and neck pain.  Skin:  Negative for rash.  Neurological:  Positive for weakness. Negative for dizziness and headaches.  Psychiatric/Behavioral:  Negative for depression.     Physical Exam Vitals and nursing note reviewed.  Constitutional:      Appearance: He is ill-appearing.  HENT:     Head: Normocephalic and atraumatic.     Mouth/Throat:     Pharynx: Oropharynx is clear.  Cardiovascular:     Rate and Rhythm: Normal rate and regular rhythm.  Pulmonary:     Effort: Pulmonary effort is normal.     Breath sounds: Decreased breath sounds and rhonchi present. No wheezing.     Comments: Breath sounds diminished in the bases Chest:     Chest wall: No crepitus.  Abdominal:     Palpations: Abdomen is soft. There is hepatomegaly and splenomegaly.     Tenderness: There is no abdominal tenderness.  Musculoskeletal:        General: Normal range of motion.  Skin:    General: Skin is warm and dry.  Neurological:     General: No focal deficit present.     Mental Status: He is alert and oriented to person, place, and time.  Psychiatric:        Mood and Affect: Mood normal.        Behavior: Behavior normal.   ASSESSMENT / PLAN:  PULMONARY A: Acute hypoxic respiratory failure Chronic pleural effusions P:   -Continue supplemental oxygen to maintain SpO2 greater than 90% - DuoNebs every 4 hours as needed - ABG and chest x-ray as needed -Monitor respiratory status closely -PRN diuretics for pleural effusions once  BP is stable  CARDIOVASCULAR A:  #Septic Shock s/t suspected source: nephrostomy tube vs UTI vs dialysis catheter #HFrEF (LVEF <20%) - Systolic and Diastolic Heart Failure Hx: Hypertension, Hyperlipidemia P:  -Continue midodrine  as ordered - Continuous cardiac monitoring - Vasopressors to maintain MAP goal >65; wean as tolerated - Currently on peripheral levophed ; monitor for infiltration - EKG and troponin as needed - Trend lactic and PCT; initial PCT 55.3 and lactic 1.1 - BNP >6k - ECHO shows EF 35-40% - Hold home antihypertensives in light of shock - Continue Lipitor 40mg    RENAL A:   #Acute on Chronic Kidney Disease #ESRD on Hemodialysis #AGMA s/t Acute on Chronic Kidney Disease + Septic Shock Hx: CKD Stage V, Left nephrectomy, S/P right nephrostomy, Neurogenic bladder requiring indwelling foley catheter P:   - Strict I/O monitoring - Trend BMP and correct electrolytes - Avoid nephrotoxins as able - ICU electrolyte replacement protocol as indicated - Nephrology following; appreciate input  GASTROINTESTINAL A:   #GERD #Severe protein calorie malnutrition #Mild transaminitis P:   - Monitor hepatic function  - AST/ALT: 44/53 (improved from previous admission 120/155); continue to trend hepatic profile - Continue Protonix  40mg  for GI prophylaxis - Constipation protocol prn - Diet:  liquid as tolerated; oral intake remains poor.  Encourage oral intake as tolerated  HEMATOLOGIC A:   #Anemia of Chronic Disease-hemoglobin 8.1 P:  - Trend CBC - Transfuse if Hgb < 7 or platelets < 10 - Monitor for s/sx of bleeding - DVT Prophylaxis: SQ heparin   INFECTIOUS A:   #Sepsis due to E. coli bacteremia-WBC trending down 12.7 today; afebrile  P:   - Trend WBC and monitor fever curve - Blood cultures positive for E. coli - RVP 20 pathogen: negative - Flu/Covid/RSV: negative - Urine culture positive for E. coli  - ABX: Changed to ceftriaxone  and Vancomycin  - Monitor and  repeat cultures if needed  ENDOCRINE A:   #Hypothyroidism-last TSH 10.01    P:   - Continue levothyroxine  and repeat TSH with morning labs - Goal range blood glucose 78 120; patient is nondiabetic  Disposition and Family update: Patient is a ward of the state and guardian is unavailable during the weekend.  HIPAA appropriate message left  Best Practice: Diet/type: Regular diet; push as much liquids as possible given patient's poor oral intake VTE prophylaxis:  SCD's /SQ heparin  GI prophylaxis: Protonix  Lines: PIV's and right subclavian dialysis catheter Foley: No; has nephrostomy tube Code Status: Full code Last date of multidisciplinary goals of care discussion 05/31/2024 during interdisciplinary rounds  Norris Bodley S. Griffin, PhD, MSN, MPH, ANP-BC [facility] Prefer epic messenger for cross cover needs If after hours, please call E-link  NB: This document was prepared using Dragon voice recognition software and may include unintentional dictation errors.       "

## 2024-05-31 NOTE — Progress Notes (Signed)
 " [facility]  ROUNDING NOTE   Subjective:   Larry Morrison is a 54 year old male with past medical conditions including tobacco abuse, anemia, hypertension, TBI, malnutrition, combined heart failure, cocaine abuse, bilateral nephrostomy tubes and end-stage renal disease on hemodialysis.  Patient presents to the emergency department for shortness of breath and has been admitted for Shortness of breath [R06.02] General weakness [R53.1] ESRD on dialysis (HCC) [N18.6, Z99.2] Nephrostomy complication [N99.528] Sepsis (HCC) [A41.9] Elevated procalcitonin [R79.89] . Patient is known to our practice and receives outpatient dialysis treatments at [facility] on a MWF schedule, supervised by Dr. Douglas.    Update:  Patient had hemodialysis treatment yesterday. Tolerated treatment well. No ultrafiltration.   Patient getting upset with ICU staff.   Weaned off norepinephrine  since 7am.   Objective:  Vital signs in last 24 hours:  Temp:  [98.1 F (36.7 C)-98.5 F (36.9 C)] 98.2 F (36.8 C) (01/10 0750) Pulse Rate:  [71-95] 84 (01/10 1300) Resp:  [18-44] 29 (01/10 1300) BP: (71-118)/(47-98) 82/57 (01/10 1300) SpO2:  [95 %-100 %] 99 % (01/10 1300) Weight:  [50.1 kg] 50.1 kg (01/10 0500)  Weight change: 0.8 kg Filed Weights   05/30/24 0801 05/30/24 1154 05/31/24 0500  Weight: 49.5 kg 49.5 kg 50.1 kg    Intake/Output: I/O last 3 completed shifts: In: 1315.5 [P.O.:240; I.V.:725.5; IV Piggyback:350] Out: 0    Intake/Output this shift:  Total I/O In: 10 [Other:10] Out: 10 [Urine:10]  Physical Exam: General: Laying in bed  Head: Normocephalic, atraumatic. Moist oral mucosal membranes  Eyes: Anicteric  Lungs:  Coarse crackles, cough, tachypnea   Heart: Regular rate and rhythm  Abdomen:  Soft, nontender, +urostomy tubes bilaterally  Extremities: 2+ peripheral edema.  Neurologic: Awake, alert, conversant  Skin: Warm,dry, no rash  Access: Left chest IJ PermCath,  left AVG nonfunctioning      Basic Metabolic Panel: Recent Labs  Lab 05/28/24 1007 05/29/24 0621 05/30/24 0330 05/31/24 0350  NA 146* 137 137 134*  K 4.5 3.7 3.6 3.6  CL 100 92* 91* 93*  CO2 27 20* 24 23  GLUCOSE 93 89 116* 88  BUN 51* 31* 47* 20  CREATININE 11.30* 6.59* 9.36* 5.46*  CALCIUM  9.6 9.2 8.9 8.7*  MG  --  1.9 1.9  --   PHOS  --  5.5* 5.0* 3.7    Liver Function Tests: Recent Labs  Lab 05/28/24 1007 05/29/24 0621 05/30/24 0330 05/31/24 0350  AST 44*  --   --   --   ALT 53*  --   --   --   ALKPHOS 79  --   --   --   BILITOT 1.0  --   --   --   PROT 8.7*  --   --   --   ALBUMIN  3.6 3.1* 3.1* 3.0*   No results for input(s): LIPASE, AMYLASE in the last 168 hours. No results for input(s): AMMONIA in the last 168 hours.  CBC: Recent Labs  Lab 05/28/24 1007 05/29/24 0621 05/30/24 0330 05/31/24 0350  WBC 16.3* 15.8* 15.1* 12.7*  NEUTROABS 13.4*  --   --  9.9*  HGB 9.7* 8.5* 8.3* 8.1*  HCT 31.1* 27.2* 26.1* 26.4*  MCV 102.3* 103.0* 102.4* 103.1*  PLT 229 233 207 200    Cardiac Enzymes: No results for input(s): CKTOTAL, CKMB, CKMBINDEX, TROPONINI in the last 168 hours.  BNP: Invalid input(s): POCBNP  CBG: Recent Labs  Lab 05/29/24 0459 05/31/24 0735  GLUCAP 90 96  Microbiology: Results for orders placed or performed during the hospital encounter of 05/28/24  Resp panel by RT-PCR (RSV, Flu A&B, Covid) Anterior Nasal Swab     Status: None   Collection Time: 05/28/24 10:07 AM   Specimen: Anterior Nasal Swab  Result Value Ref Range Status   SARS Coronavirus 2 by RT PCR NEGATIVE NEGATIVE Final    Comment: (NOTE) SARS-CoV-2 target nucleic acids are NOT DETECTED.  The SARS-CoV-2 RNA is generally detectable in upper respiratory specimens during the acute phase of infection. The lowest concentration of SARS-CoV-2 viral copies this assay can detect is 138 copies/mL. A negative result does not preclude SARS-Cov-2 infection and  should not be used as the sole basis for treatment or other patient management decisions. A negative result may occur with  improper specimen collection/handling, submission of specimen other than nasopharyngeal swab, presence of viral mutation(s) within the areas targeted by this assay, and inadequate number of viral copies(<138 copies/mL). A negative result must be combined with clinical observations, patient history, and epidemiological information. The expected result is Negative.  Fact Sheet for Patients:  bloggercourse.com  Fact Sheet for Healthcare Providers:  seriousbroker.it  This test is no t yet approved or cleared by the [place]  FDA and  has been authorized for detection and/or diagnosis of SARS-CoV-2 by FDA under an Emergency Use Authorization (EUA). This EUA will remain  in effect (meaning this test can be used) for the duration of the COVID-19 declaration under Section 564(b)(1) of the Act, 21 U.S.C.section 360bbb-3(b)(1), unless the authorization is terminated  or revoked sooner.       Influenza A by PCR NEGATIVE NEGATIVE Final   Influenza B by PCR NEGATIVE NEGATIVE Final    Comment: (NOTE) The Xpert Xpress SARS-CoV-2/FLU/RSV plus assay is intended as an aid in the diagnosis of influenza from Nasopharyngeal swab specimens and should not be used as a sole basis for treatment. Nasal washings and aspirates are unacceptable for Xpert Xpress SARS-CoV-2/FLU/RSV testing.  Fact Sheet for Patients: bloggercourse.com  Fact Sheet for Healthcare Providers: seriousbroker.it  This test is not yet approved or cleared by the [place]  FDA and has been authorized for detection and/or diagnosis of SARS-CoV-2 by FDA under an Emergency Use Authorization (EUA). This EUA will remain in effect (meaning this test can be used) for the duration of the COVID-19 declaration  under Section 564(b)(1) of the Act, 21 U.S.C. section 360bbb-3(b)(1), unless the authorization is terminated or revoked.     Resp Syncytial Virus by PCR NEGATIVE NEGATIVE Final    Comment: (NOTE) Fact Sheet for Patients: bloggercourse.com  Fact Sheet for Healthcare Providers: seriousbroker.it  This test is not yet approved or cleared by the [place]  FDA and has been authorized for detection and/or diagnosis of SARS-CoV-2 by FDA under an Emergency Use Authorization (EUA). This EUA will remain in effect (meaning this test can be used) for the duration of the COVID-19 declaration under Section 564(b)(1) of the Act, 21 U.S.C. section 360bbb-3(b)(1), unless the authorization is terminated or revoked.  Performed at [facility], [address] [place], [place] [address]   Respiratory (~20 pathogens) panel by PCR     Status: None   Collection Time: 05/28/24 10:07 AM   Specimen: Nasopharyngeal Swab; Respiratory  Result Value Ref Range Status   Adenovirus NOT DETECTED NOT DETECTED Final   Coronavirus 229E NOT DETECTED NOT DETECTED Final    Comment: (NOTE) The Coronavirus on the Respiratory Panel, DOES NOT test for the novel  Coronavirus (  2019 nCoV)    Coronavirus HKU1 NOT DETECTED NOT DETECTED Final   Coronavirus NL63 NOT DETECTED NOT DETECTED Final   Coronavirus OC43 NOT DETECTED NOT DETECTED Final   Metapneumovirus NOT DETECTED NOT DETECTED Final   Rhinovirus / Enterovirus NOT DETECTED NOT DETECTED Final   Influenza A NOT DETECTED NOT DETECTED Final   Influenza B NOT DETECTED NOT DETECTED Final   Parainfluenza Virus 1 NOT DETECTED NOT DETECTED Final   Parainfluenza Virus 2 NOT DETECTED NOT DETECTED Final   Parainfluenza Virus 3 NOT DETECTED NOT DETECTED Final   Parainfluenza Virus 4 NOT DETECTED NOT DETECTED Final   Respiratory Syncytial Virus NOT DETECTED NOT DETECTED Final   Bordetella pertussis NOT DETECTED NOT  DETECTED Final   Bordetella Parapertussis NOT DETECTED NOT DETECTED Final   Chlamydophila pneumoniae NOT DETECTED NOT DETECTED Final   Mycoplasma pneumoniae NOT DETECTED NOT DETECTED Final    Comment: Performed at [facility], [address] [place], [place] [address]  Blood culture (routine x 2)     Status: None (Preliminary result)   Collection Time: 05/28/24 12:52 PM   Specimen: BLOOD  Result Value Ref Range Status   Specimen Description BLOOD RIGHT ANTECUBITAL  Final   Special Requests   Final    BOTTLES DRAWN AEROBIC AND ANAEROBIC Blood Culture results may not be optimal due to an inadequate volume of blood received in culture bottles   Culture   Final    NO GROWTH 3 DAYS Performed at [facility], [address], [place], [place] [address]    Report Status PENDING  Incomplete  Urine Culture     Status: Abnormal   Collection Time: 05/28/24 12:52 PM   Specimen: Urine, Catheterized  Result Value Ref Range Status   Specimen Description   Final    URINE, CATHETERIZED Performed at [facility], [address] [place], [place] [address]    Special Requests   Final    NONE Performed at [facility], [address] [place], [place] [address]    Culture (A)  Final    >=100,000 COLONIES/mL ESCHERICHIA COLI >=100,000 COLONIES/mL CITROBACTER KOSERI    Report Status 05/31/2024 FINAL  Final   Organism ID, Bacteria ESCHERICHIA COLI (A)  Final   Organism ID, Bacteria CITROBACTER KOSERI (A)  Final      Susceptibility   Citrobacter koseri - MIC*    CEFEPIME  <=0.12 SENSITIVE Sensitive     ERTAPENEM <=0.12 SENSITIVE Sensitive     CEFTRIAXONE  <=0.25 SENSITIVE Sensitive     CIPROFLOXACIN  <=0.06 SENSITIVE Sensitive     GENTAMICIN <=1 SENSITIVE Sensitive     NITROFURANTOIN  32 SENSITIVE Sensitive     TRIMETH /SULFA  <=20 SENSITIVE Sensitive     PIP/TAZO Value in next row Sensitive      <=4 SENSITIVEThis is a modified FDA-approved test that has been  validated and its performance characteristics determined by the reporting laboratory.  This laboratory is certified under the Clinical Laboratory Improvement Amendments CLIA as qualified to perform high complexity clinical laboratory testing.    MEROPENEM Value in next row Sensitive      <=4 SENSITIVEThis is a modified FDA-approved test that has been validated and its performance characteristics determined by the reporting laboratory.  This laboratory is certified under the Clinical Laboratory Improvement Amendments CLIA as qualified to perform high complexity clinical laboratory testing.    * >=100,000 COLONIES/mL CITROBACTER KOSERI   Escherichia coli - MIC*    AMPICILLIN Value in next row Sensitive      <=  4 SENSITIVEThis is a modified FDA-approved test that has been validated and its performance characteristics determined by the reporting laboratory.  This laboratory is certified under the Clinical Laboratory Improvement Amendments CLIA as qualified to perform high complexity clinical laboratory testing.    CEFAZOLIN  (URINE) Value in next row Sensitive      2 SENSITIVEThis is a modified FDA-approved test that has been validated and its performance characteristics determined by the reporting laboratory.  This laboratory is certified under the Clinical Laboratory Improvement Amendments CLIA as qualified to perform high complexity clinical laboratory testing.    CEFEPIME  Value in next row Sensitive      2 SENSITIVEThis is a modified FDA-approved test that has been validated and its performance characteristics determined by the reporting laboratory.  This laboratory is certified under the Clinical Laboratory Improvement Amendments CLIA as qualified to perform high complexity clinical laboratory testing.    ERTAPENEM Value in next row Sensitive      2 SENSITIVEThis is a modified FDA-approved test that has been validated and its performance characteristics determined by the reporting laboratory.  This  laboratory is certified under the Clinical Laboratory Improvement Amendments CLIA as qualified to perform high complexity clinical laboratory testing.    CEFTRIAXONE  Value in next row Sensitive      2 SENSITIVEThis is a modified FDA-approved test that has been validated and its performance characteristics determined by the reporting laboratory.  This laboratory is certified under the Clinical Laboratory Improvement Amendments CLIA as qualified to perform high complexity clinical laboratory testing.    CIPROFLOXACIN  Value in next row Sensitive      2 SENSITIVEThis is a modified FDA-approved test that has been validated and its performance characteristics determined by the reporting laboratory.  This laboratory is certified under the Clinical Laboratory Improvement Amendments CLIA as qualified to perform high complexity clinical laboratory testing.    GENTAMICIN Value in next row Sensitive      2 SENSITIVEThis is a modified FDA-approved test that has been validated and its performance characteristics determined by the reporting laboratory.  This laboratory is certified under the Clinical Laboratory Improvement Amendments CLIA as qualified to perform high complexity clinical laboratory testing.    NITROFURANTOIN  Value in next row Sensitive      2 SENSITIVEThis is a modified FDA-approved test that has been validated and its performance characteristics determined by the reporting laboratory.  This laboratory is certified under the Clinical Laboratory Improvement Amendments CLIA as qualified to perform high complexity clinical laboratory testing.    TRIMETH /SULFA  Value in next row Sensitive      2 SENSITIVEThis is a modified FDA-approved test that has been validated and its performance characteristics determined by the reporting laboratory.  This laboratory is certified under the Clinical Laboratory Improvement Amendments CLIA as qualified to perform high complexity clinical laboratory testing.     AMPICILLIN/SULBACTAM Value in next row Sensitive      2 SENSITIVEThis is a modified FDA-approved test that has been validated and its performance characteristics determined by the reporting laboratory.  This laboratory is certified under the Clinical Laboratory Improvement Amendments CLIA as qualified to perform high complexity clinical laboratory testing.    PIP/TAZO Value in next row Sensitive      <=4 SENSITIVEThis is a modified FDA-approved test that has been validated and its performance characteristics determined by the reporting laboratory.  This laboratory is certified under the Clinical Laboratory Improvement Amendments CLIA as qualified to perform high complexity clinical laboratory testing.    MEROPENEM Value in  next row Sensitive      <=4 SENSITIVEThis is a modified FDA-approved test that has been validated and its performance characteristics determined by the reporting laboratory.  This laboratory is certified under the Clinical Laboratory Improvement Amendments CLIA as qualified to perform high complexity clinical laboratory testing.    * >=100,000 COLONIES/mL ESCHERICHIA COLI  Expectorated Sputum Assessment w Gram Stain, Rflx to Resp Cult     Status: None   Collection Time: 05/28/24  1:18 PM   Specimen: Salivary Gland; Sputum  Result Value Ref Range Status   Specimen Description SALIVA  Final   Special Requests EXPSU  Final   Sputum evaluation   Final    THIS SPECIMEN IS ACCEPTABLE FOR SPUTUM CULTURE Performed at [facility], [address] [place], [place] [address]    Report Status 05/29/2024 FINAL  Final  Culture, Respiratory w Gram Stain     Status: None (Preliminary result)   Collection Time: 05/28/24  1:18 PM  Result Value Ref Range Status   Specimen Description EXPECTORATED SPUTUM  Final   Special Requests Reflexed from [id number]  Final   Gram Stain   Final    FEW WBC PRESENT, PREDOMINANTLY PMN RARE GRAM NEGATIVE RODS    Culture   Final    CULTURE  REINCUBATED FOR BETTER GROWTH Performed at [facility], [address], [place], [place] [address]    Report Status PENDING  Incomplete  MRSA Next Gen by PCR, Nasal     Status: None   Collection Time: 05/28/24  9:40 PM   Specimen: Nasal Mucosa; Nasal Swab  Result Value Ref Range Status   MRSA by PCR Next Gen NOT DETECTED NOT DETECTED Final    Comment: (NOTE) The GeneXpert MRSA Assay (FDA approved for NASAL specimens only), is one component of a comprehensive MRSA colonization surveillance program. It is not intended to diagnose MRSA infection nor to guide or monitor treatment for MRSA infections. Test performance is not FDA approved in patients less than 48 years old. Performed at [facility], [address] [place], [place] [address]   Blood culture (routine x 2)     Status: None (Preliminary result)   Collection Time: 05/28/24 10:24 PM   Specimen: BLOOD  Result Value Ref Range Status   Specimen Description BLOOD BLOOD RIGHT HAND  Final   Special Requests   Final    BOTTLES DRAWN AEROBIC ONLY Blood Culture results may not be optimal due to an inadequate volume of blood received in culture bottles   Culture   Final    NO GROWTH 3 DAYS Performed at [facility], [address] [place], [place] [address]    Report Status PENDING  Incomplete  Body fluid culture w Gram Stain     Status: None (Preliminary result)   Collection Time: 05/29/24  8:00 AM   Specimen: Other Source; Body Fluid  Result Value Ref Range Status   Specimen Description   Final    OTHER Performed at [facility], [address], [place], [place] [address]    Special Requests   Final    NONE Performed at [facility], [address] [place], [place] [address]    Gram Stain   Final    FEW WBC PRESENT, PREDOMINANTLY PMN MODERATE GRAM POSITIVE COCCI FEW GRAM NEGATIVE RODS    Culture   Final    CULTURE REINCUBATED FOR BETTER GROWTH ABUNDANT ESCHERICHIA  COLI ABUNDANT CITROBACTER KOSERI FEW PROTEUS MIRABILIS ATTEMPTING  TO ISOLATE FOR WORKUP. Performed at [facility], [address], [place], [place] [address]    Report Status PENDING  Incomplete    Coagulation Studies: No results for input(s): LABPROT, INR in the last 72 hours.  Urinalysis: No results for input(s): COLORURINE, LABSPEC, PHURINE, GLUCOSEU, HGBUR, BILIRUBINUR, KETONESUR, PROTEINUR, UROBILINOGEN, NITRITE, LEUKOCYTESUR in the last 72 hours.  Invalid input(s): APPERANCEUR     Imaging: ECHOCARDIOGRAM COMPLETE Result Date: 05/30/2024    ECHOCARDIOGRAM REPORT   Patient Name:   Larry Morrison Date of Exam: 05/30/2024 Medical Rec #:  [id number]    Height:       64.0 in Accession #:    [id number]   Weight:       109.1 lb Date of Birth:  1970/06/06     BSA:          1.512 m Patient Age:    53 years     BP:           95/63 mmHg Patient Gender: M            HR:           79 bpm. Exam Location:  [facility] Procedure: 2D Echo, Cardiac Doppler and Color Doppler (Both Spectral and Color            Flow Doppler were utilized during procedure). Indications:     Congestive Heart Failure I50.9  History:         Patient has prior history of Echocardiogram examinations, most                  recent 04/12/2021. CHF.  Sonographer:     Ashley McNeely-Sloane Referring Phys:  [id number] ROBET KIM Diagnosing Phys: Caron Poser  Sonographer Comments: Technically difficult study due to poor echo windows. Image acquisition challenging due to uncooperative patient. IMPRESSIONS  1. Limited views available due to patient cooperation. No apical windows.  2. Left ventricular ejection fraction, by estimation, is 35 to 40%. The left ventricle has moderately decreased function. Left ventricular endocardial border not optimally defined to evaluate regional wall motion. Left ventricular diastolic parameters are consistent with Grade I diastolic dysfunction (impaired relaxation).  3. Right  ventricular systolic function is normal. The right ventricular size is normal.  4. There is no evidence of pericardial effusion. Unable to exclude an apical effusion given lack of apical window.  5. The mitral valve is normal in structure. Trivial mitral valve regurgitation. No evidence of mitral stenosis.  6. The aortic valve has an indeterminant number of cusps. There is mild calcification of the aortic valve. Aortic valve regurgitation is trivial. Aortic valve sclerosis/calcification is present, without any evidence of aortic stenosis.  7. The inferior vena cava is normal in size with greater than 50% respiratory variability, suggesting right atrial pressure of 3 mmHg. Comparison(s): A prior study was performed on 04/12/2021. LV function has improved based on limited views available. FINDINGS  Left Ventricle: Left ventricular ejection fraction, by estimation, is 35 to 40%. The left ventricle has moderately decreased function. Left ventricular endocardial border not optimally defined to evaluate regional wall motion. The left ventricular internal cavity size was normal in size. There is no left ventricular hypertrophy. Left ventricular diastolic parameters are consistent with Grade I diastolic dysfunction (impaired relaxation). Right Ventricle: The right ventricular size is normal. No increase in right ventricular wall thickness. Right ventricular systolic function is normal. Left Atrium: Left atrial size was not well visualized. Right Atrium: Right atrial size was not  well visualized. Pericardium: There is no evidence of pericardial effusion. Mitral Valve: The mitral valve is normal in structure. Trivial mitral valve regurgitation. No evidence of mitral valve stenosis. MV peak gradient, 3.6 mmHg. The mean mitral valve gradient is 2.0 mmHg. Tricuspid Valve: The tricuspid valve is normal in structure. Tricuspid valve regurgitation is trivial. Aortic Valve: The aortic valve has an indeterminant number of cusps.  There is mild calcification of the aortic valve. Aortic valve regurgitation is trivial. Aortic valve sclerosis/calcification is present, without any evidence of aortic stenosis. Aortic valve mean gradient measures 3.0 mmHg. Aortic valve peak gradient measures 5.0 mmHg. Aortic valve area, by VTI measures 1.92 cm. Pulmonic Valve: The pulmonic valve was not well visualized. Pulmonic valve regurgitation is not visualized. No evidence of pulmonic stenosis. Aorta: The aortic root is normal in size and structure. Venous: The inferior vena cava is normal in size with greater than 50% respiratory variability, suggesting right atrial pressure of 3 mmHg. IAS/Shunts: The interatrial septum was not well visualized.  LEFT VENTRICLE PLAX 2D LVIDd:         4.60 cm   Diastology LVIDs:         2.90 cm   LV e' medial:    5.00 cm/s LV PW:         0.60 cm   LV E/e' medial:  13.0 LV IVS:        1.00 cm   LV e' lateral:   5.11 cm/s LVOT diam:     1.80 cm   LV E/e' lateral: 12.7 LV SV:         36 LV SV Index:   24 LVOT Area:     2.54 cm  RIGHT VENTRICLE RV S prime:     20.90 cm/s TAPSE (M-mode): 2.0 cm LEFT ATRIUM         Index LA diam:    2.80 cm 1.85 cm/m  AORTIC VALVE                    PULMONIC VALVE AV Area (Vmax):    1.81 cm     PV Vmax:        0.93 m/s AV Area (Vmean):   1.70 cm     PV Vmean:       61.600 cm/s AV Area (VTI):     1.92 cm     PV VTI:         0.174 m AV Vmax:           112.00 cm/s  PV Peak grad:   3.5 mmHg AV Vmean:          75.100 cm/s  PV Mean grad:   2.0 mmHg AV VTI:            0.186 m      RVOT Peak grad: 2 mmHg AV Peak Grad:      5.0 mmHg AV Mean Grad:      3.0 mmHg LVOT Vmax:         79.85 cm/s LVOT Vmean:        50.200 cm/s LVOT VTI:          0.140 m LVOT/AV VTI ratio: 0.76  AORTA Ao Root diam: 3.30 cm MITRAL VALVE MV Area (PHT): 3.17 cm    SHUNTS MV Area VTI:   1.72 cm    Systemic VTI:  0.14 m MV Peak grad:  3.6 mmHg    Systemic Diam: 1.80 cm MV Mean grad:  2.0  mmHg    Pulmonic VTI:  0.139 m MV Vmax:        0.95 m/s MV Vmean:      66.4 cm/s MV Decel Time: 239 msec MV E velocity: 64.90 cm/s MV A velocity: 93.50 cm/s MV E/A ratio:  0.69 Caron Poser Electronically signed by Caron Poser Signature Date/Time: 05/30/2024/1:17:00 PM    Final       Medications:    sodium chloride  5 mL/hr at 05/31/24 0700   cefTRIAXone  (ROCEPHIN )  IV 1 g (05/31/24 1058)   norepinephrine  (LEVOPHED ) Adult infusion 5 mcg/min (05/31/24 0700)   [START ON 06/02/2024] vancomycin       aspirin  EC  81 mg Oral Daily   atorvastatin   40 mg Oral Daily   calcium  acetate  1,334 mg Oral TID WC   Chlorhexidine  Gluconate Cloth  6 each Topical Q0600   dextromethorphan -guaiFENesin   1 tablet Oral BID   epoetin  alfa-epbx (RETACRIT ) injection  10,000 Units Intravenous Q M,W,F-1800   feeding supplement  237 mL Oral BID BM   heparin   5,000 Units Subcutaneous Q8H   levothyroxine   50 mcg Oral Daily   lidocaine -prilocaine   1 Application Topical Once per day on Monday Wednesday Friday   midodrine   5 mg Oral TID WC   multivitamin  1 tablet Oral QHS   mouth rinse  15 mL Mouth Rinse 4 times per day   pantoprazole   40 mg Oral Daily   sodium chloride  flush  3 mL Intravenous Q12H   acetaminophen  **OR** acetaminophen , diphenhydrAMINE , famotidine , heparin , HYDROmorphone  (DILAUDID ) injection, ipratropium-albuterol , lidocaine -prilocaine , methylPREDNISolone  (SOLU-MEDROL ) injection, midazolam , mouth rinse, mouth rinse, pentafluoroprop-tetrafluoroeth  Assessment/ Plan:  Larry Morrison is a 54 y.o.  male with past medical conditions including tobacco abuse, anemia, hypertension, TBI, malnutrition, combined heart failure, cocaine abuse, bilateral nephrostomy tubes and end-stage renal disease on hemodialysis.  Patient presents to the emergency department for shortness of breath and has been admitted for Shortness of breath [R06.02] General weakness [R53.1] ESRD on dialysis (HCC) [N18.6, Z99.2] Nephrostomy complication [N99.528] Sepsis (HCC)  [A41.9] Elevated procalcitonin [R79.89]    End-stage renal disease on hemodialysis. Next treatment for Monday  2.  Sepsis with E. Coli and Citrobacter species growing from urine.  With hypotension requiring vasopressors.  IR planning nephrostomy exchange this admission - ceftriaxone  and vancomycin  - weaned off norepinephrine .   3. Anemia of chronic kidney disease Lab Results  Component Value Date   HGB 8.1 (L) 05/31/2024    ESA with HD treatments  4. Secondary Hyperparathyroidism: with outpatient labs: PTH 438, phosphorus 5.9, calcium  8.3 on 04/28/24.   Lab Results  Component Value Date   CALCIUM  8.7 (L) 05/31/2024   CAION 0.79 (LL) 09/23/2021   PHOS 3.7 05/31/2024    Patient prescribed calcitriol  and calcium  acetate outpatient. Will continue to monitor.   5.  Hypotensive with history of hypertension with chronic kidney disease.  Home regimen includes carvedilol  and irbesartan  being held - weaned off vasopressor: norepinephrine .    LOS: 3 Naesha Buckalew 1/10/20262:30 PM   "

## 2024-05-31 NOTE — Plan of Care (Signed)
   Problem: Education: Goal: Knowledge of General Education information will improve Description: Including pain rating scale, medication(s)/side effects and non-pharmacologic comfort measures Outcome: Progressing   Problem: Coping: Goal: Level of anxiety will decrease Outcome: Progressing

## 2024-05-31 NOTE — Plan of Care (Signed)
" °  Problem: Education: Goal: Knowledge of General Education information will improve Description: Including pain rating scale, medication(s)/side effects and non-pharmacologic comfort measures Outcome: Progressing   Problem: Clinical Measurements: Goal: Respiratory complications will improve Outcome: Progressing Goal: Cardiovascular complication will be avoided Outcome: Progressing   Problem: Elimination: Goal: Will not experience complications related to bowel motility Outcome: Progressing   Problem: Pain Managment: Goal: General experience of comfort will improve and/or be controlled Outcome: Progressing   Problem: Safety: Goal: Ability to remain free from injury will improve Outcome: Progressing   Problem: Skin Integrity: Goal: Risk for impaired skin integrity will decrease Outcome: Progressing   Problem: Clinical Measurements: Goal: Ability to maintain clinical measurements within normal limits will improve Outcome: Not Progressing Goal: Will remain free from infection Outcome: Not Progressing Goal: Diagnostic test results will improve Outcome: Not Progressing   Problem: Activity: Goal: Risk for activity intolerance will decrease Outcome: Not Progressing   Problem: Nutrition: Goal: Adequate nutrition will be maintained Outcome: Not Progressing   Problem: Coping: Goal: Level of anxiety will decrease Outcome: Not Progressing   "

## 2024-06-01 ENCOUNTER — Inpatient Hospital Stay

## 2024-06-01 LAB — CULTURE, RESPIRATORY W GRAM STAIN

## 2024-06-01 LAB — RENAL FUNCTION PANEL
Albumin: 2.9 g/dL — ABNORMAL LOW (ref 3.5–5.0)
Anion gap: 22 — ABNORMAL HIGH (ref 5–15)
BUN: 32 mg/dL — ABNORMAL HIGH (ref 6–20)
CO2: 21 mmol/L — ABNORMAL LOW (ref 22–32)
Calcium: 9 mg/dL (ref 8.9–10.3)
Chloride: 93 mmol/L — ABNORMAL LOW (ref 98–111)
Creatinine, Ser: 8.45 mg/dL — ABNORMAL HIGH (ref 0.61–1.24)
GFR, Estimated: 7 mL/min — ABNORMAL LOW
Glucose, Bld: 60 mg/dL — ABNORMAL LOW (ref 70–99)
Phosphorus: 5.3 mg/dL — ABNORMAL HIGH (ref 2.5–4.6)
Potassium: 4.1 mmol/L (ref 3.5–5.1)
Sodium: 135 mmol/L (ref 135–145)

## 2024-06-01 LAB — CBC
HCT: 24.1 % — ABNORMAL LOW (ref 39.0–52.0)
Hemoglobin: 7.5 g/dL — ABNORMAL LOW (ref 13.0–17.0)
MCH: 32.6 pg (ref 26.0–34.0)
MCHC: 31.1 g/dL (ref 30.0–36.0)
MCV: 104.8 fL — ABNORMAL HIGH (ref 80.0–100.0)
Platelets: 195 K/uL (ref 150–400)
RBC: 2.3 MIL/uL — ABNORMAL LOW (ref 4.22–5.81)
RDW: 14.1 % (ref 11.5–15.5)
WBC: 14.1 K/uL — ABNORMAL HIGH (ref 4.0–10.5)
nRBC: 0.2 % (ref 0.0–0.2)

## 2024-06-01 LAB — TSH: TSH: 4.69 u[IU]/mL — ABNORMAL HIGH (ref 0.350–4.500)

## 2024-06-01 LAB — GLUCOSE, CAPILLARY
Glucose-Capillary: 127 mg/dL — ABNORMAL HIGH (ref 70–99)
Glucose-Capillary: 63 mg/dL — ABNORMAL LOW (ref 70–99)
Glucose-Capillary: 83 mg/dL (ref 70–99)

## 2024-06-01 LAB — CORTISOL-AM, BLOOD: Cortisol - AM: 18.8 ug/dL (ref 6.7–22.6)

## 2024-06-01 LAB — MAGNESIUM: Magnesium: 2.2 mg/dL (ref 1.7–2.4)

## 2024-06-01 MED ORDER — OXYCODONE HCL 5 MG PO TABS
5.0000 mg | ORAL_TABLET | ORAL | Status: DC | PRN
  Administered 2024-06-01 – 2024-06-06 (×8): 5 mg via ORAL
  Filled 2024-06-01 (×8): qty 1

## 2024-06-01 MED ORDER — METRONIDAZOLE 500 MG/100ML IV SOLN
500.0000 mg | Freq: Three times a day (TID) | INTRAVENOUS | Status: DC
  Administered 2024-06-01 – 2024-06-03 (×7): 500 mg via INTRAVENOUS
  Filled 2024-06-01 (×9): qty 100

## 2024-06-01 MED ORDER — MIDODRINE HCL 5 MG PO TABS
10.0000 mg | ORAL_TABLET | Freq: Three times a day (TID) | ORAL | Status: DC
  Administered 2024-06-01 – 2024-06-05 (×8): 10 mg via ORAL
  Filled 2024-06-01 (×9): qty 2

## 2024-06-01 MED ORDER — NEPRO/CARBSTEADY PO LIQD
237.0000 mL | Freq: Three times a day (TID) | ORAL | Status: DC
  Administered 2024-06-01 – 2024-06-02 (×2): 237 mL via ORAL

## 2024-06-01 MED ORDER — LACTULOSE 10 GM/15ML PO SOLN
30.0000 g | Freq: Once | ORAL | Status: DC
  Filled 2024-06-01: qty 60

## 2024-06-01 MED ORDER — DEXTROSE 50 % IV SOLN
12.5000 g | Freq: Once | INTRAVENOUS | Status: AC
  Administered 2024-06-01: 12.5 g via INTRAVENOUS
  Filled 2024-06-01: qty 50

## 2024-06-01 NOTE — Consult Note (Signed)
 PHARMACY CONSULT NOTE - ELECTROLYTES  Pharmacy Consult for Electrolyte Monitoring and Replacement   Recent Labs: Height: 5' 4 (162.6 cm) Weight: 48.8 kg (107 lb 9.4 oz) IBW/kg (Calculated) : 59.2 Estimated Creatinine Clearance: 6.8 mL/min (A) (by C-G formula based on SCr of 8.45 mg/dL (H)). Potassium (mmol/L)  Date Value  06/01/2024 4.1  10/22/2013 4.7   Magnesium  (mg/dL)  Date Value  98/88/7973 2.2  07/17/2012 2.2   Calcium  (mg/dL)  Date Value  98/88/7973 9.0   Calcium , Total (mg/dL)  Date Value  93/96/7984 9.3   Albumin  (g/dL)  Date Value  98/88/7973 2.9 (L)  07/17/2012 3.1 (L)   Phosphorus (mg/dL)  Date Value  98/88/7973 5.3 (H)   Sodium (mmol/L)  Date Value  06/01/2024 135  10/22/2013 138   Corrected Ca: 9.9 mg/dL  Assessment  Larry Morrison is a 54 y.o. male presenting with sepsis. PMH significant for ESRD HD MWF, HFrEF, HTN, HLD, hypothyroidism. Pharmacy has been consulted to monitor and replace electrolytes.  Goal of Therapy: Electrolytes WNL  Plan:  No electrolyte replacement warranted for today Check BMP, Mg, Phos with AM labs  Thank you for allowing pharmacy to be a part of this patient's care.  Adriana Bolster, PharmD, BCPS  06/01/2024 6:47 AM

## 2024-06-01 NOTE — Progress Notes (Addendum)
 Patient has refused all medications other than the IV antibiotic, Midodrine  and pain medication. Patient has refused turns and mobility in bed, as well as refusing to eat and drink fluids. MD made aware.

## 2024-06-01 NOTE — Progress Notes (Signed)
 "  NAME:  Larry Morrison, MRN:  [id number], DOB:  03/10/1971, LOS: 4 ADMISSION DATE:  05/28/2024  CONSULTATION DATE:  05/29/2024 REFERRING MD:  Dr. 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  He recently presented to [facility] ED on 12/30 due to weakness, nausea and vomiting. He improved after arrival and was discharged back to the SNF he resides at. There was suspicion of mild acute pancreatitis given mild lipase elevation but no specific etiology was identified.   History gathered via chart review and evaluation at bedside He presented from his nursing home via EMS to [facility] ED on 05/28/24 due to tachypnea and of shortness of breath. The patient endorsed his shortness of breath had been present for approximately 24 hours, along with a worsening, wet cough. At the time of evaluation in the ED, he denied fevers and abdominal pain. His last dialysis session was on Monday and he was due for dialysis per his normal dialysis schedule on arrival. It was noted that his right nephrostomy tube output was a tan/yellow color, different from his baseline output appearance.   ED Course   Vitals on Arrival: Temp 98.38F, BP 11/69, HR 113, RR 22, SpO2 95%   Pertinent Labs/Diagnostics Chemistry:  Na+:146, K+: 4.5, BUN/Cr.: 51/11.3, Serum CO2/ AG: 27/19, AST/ALT: 44/53 Hematology:  WBC: 16.3, Hgb: 9.7, Platelets 229 BNP: pending Lactic/ PCT: 1.9/55.3 COVID-19 & Influenza A/B: negative MRSA PCR: negative Blood cultures x 2 drawn Respiratory Viral Panel (20 pathogen): negative UA: turbid appearance, small Hgb and moderate leukocytes Urine culture ordered CXR 05/28/24: chronic  pleural effusions; stable on the left and regressed on the right without any acute cardiopulmonary process.   Medications Administered Ceftriaxone  x 1 Vancomycin    The patient was evaluated by the hospitalist service for admission and nephrology was consulted with plans for dialysis.    Overnight the patient was noted to have persistent hypotension. His MAPs were in the mid 50's to mid 60's. He was administered 5mg  of midodrine  with very minimal improvement in blood pressure. Given his heart failure history, higher doses were not attempted.    PCCM consulted for admission due to hypotension and was started on a levophed  gtt.     Pertinent Medical History  Hypertension Pulmonary Hypertension HFrEF Cardiomyopathy ESRD on hemodialysis History of left Nephrectomy Right sided Nephrostomy Stroke Tobacco abuse Protein Calorie Malnutrition Polysubstance abuse Neurogenic Bladder with chronic indwelling foley catheter Anemia of Chronic Disease   Significant Hospital Events: Including procedures, antibiotic start and stop dates in addition to other pertinent events   05/28/24: Presented to [facility] ED with concern for shortness of breath. Was not found to be overly hypoxic; stable on 4L nasal cannula. Admitted for sepsis workup with possible source being right nephrostomy tube. Nephrology consulted with plan for hemodialysis. 05/29/24: Did not complete dialysis d/t progressively worsening hypotension requiring PCCM consult for levophed  gtt. SpO2 stable on 4L nasal cannula. Started on Zosyn  and Vanc.  05/30/24: No acute issues overnight 05/31/24: Agitated and refusing care.  Complaining of pain 06/01/24: Complaining of right rib cage pain.  Requested multiple doses of pain medications.  Off pressors   Interim History / Subjective:  C/O Right  sided pain that appears to be in his rib cage or referred Pain from his nephrostomy tube. On dilaudid  1 mg q6 but states it doesn't help. Still feisty and gets  agitated easily.  Off pressors and mean arterial blood pressure has been staying in the 70s.  He is on vancomycin  and ceftriaxone  for E. coli bacteremia.  Right nephrostomy tube in place and draining purulent drainage.  He is refusing medications as well as meals.  Review of Systems: Limited because patient is very angry and refusing to answer questions  Constitutional:  Positive for malaise/fatigue. Negative for chills and fever.  HENT:  Negative for sore throat.   Eyes:  Negative for discharge.  Respiratory: Denies shortness of breath, cough and wheezing. Cardiovascular:  Positive for leg swelling. Negative for chest pain and palpitations.  Gastrointestinal:  Negative for abdominal pain, diarrhea, heartburn, nausea and vomiting.  No documented bowel movement since yesterday Musculoskeletal:  Negative for myalgias and neck pain.  Skin:  Negative for rash.  Neurological:  Positive for weakness. Negative for dizziness and headaches.  Psychiatric/Behavioral:  Negative for depression.    Physical Exam Vitals and nursing note reviewed.  Constitutional:      Appearance: He is ill-appearing and cachectic HENT:     Head: Normocephalic and atraumatic.     Mouth/Throat:     Pharynx: Oropharynx is clear.  Cardiovascular:     Rate and Rhythm: Normal rate and regular rhythm.  Pulmonary:     Effort: Pulmonary effort is normal.     Breath sounds: Decreased breath sounds and rhonchi present. No wheezing.     Comments: Breath sounds diminished in the bases Chest:     Chest wall: No crepitus.  Right chest wall tender to palpation Abdominal:     Palpations: Abdomen is soft. There is hepatomegaly and splenomegaly.     Tenderness: There is no abdominal tenderness.  Musculoskeletal:        General: Normal range of motion.  Skin:    General: Skin is warm and dry.  Neurological:     General: No focal deficit present.     Mental Status: He is alert and oriented to person, place, and time.   Psychiatric:        Mood and Affect: Mood normal.        Behavior: Behavior normal.   ASSESSMENT / PLAN:  PULMONARY A: Acute hypoxic respiratory failure Chronic pleural effusions Right lung infiltrate and left lung consolidation and effusion-there is concern for aspiration pneumonia P:   -Continue supplemental oxygen to maintain SpO2 greater than 90% - DuoNebs every 4 hours as needed - ABG and chest x-ray as needed -Monitor respiratory status closely -PRN diuretics for pleural effusions once BP is stable -Continue ceftriaxone  and add Flagyl  for anaerobic coverage  CARDIOVASCULAR A:  #Septic Shock s/t suspected source: nephrostomy tube vs UTI vs dialysis catheter #HFrEF (LVEF <20%) - Systolic and Diastolic Heart Failure Hx: Hypertension, Hyperlipidemia P:  -Continue midodrine  as ordered - Continuous cardiac monitoring - Vasopressors to maintain MAP goal >65; wean as tolerated - Currently on peripheral levophed ; monitor for infiltration - EKG and troponin as needed - Trend lactic and PCT; initial PCT 55.3 and lactic 1.1 - BNP >6k - ECHO shows EF 35-40% - Hold home antihypertensives in light of shock - Continue Lipitor 40mg    RENAL A:   #Acute on Chronic Kidney Disease #ESRD on Hemodialysis #AGMA s/t Acute on Chronic Kidney Disease + Septic Shock Hx: CKD Stage V, Left nephrectomy,  S/P right nephrostomy, Neurogenic bladder requiring indwelling foley catheter P:   - Strict I/O monitoring - Trend BMP and correct electrolytes - Avoid nephrotoxins as able - ICU electrolyte replacement protocol as indicated - Nephrology following; appreciate input  GASTROINTESTINAL A:   #GERD #Severe protein calorie malnutrition-persistent poor oral intake #Mild transaminitis P:   - Monitor hepatic function  - AST/ALT: 44/53 (improved from previous admission 120/155); continue to trend hepatic profile - Continue Protonix  40mg  for GI prophylaxis - Constipation protocol prn - Diet:  liquid as tolerated; oral intake remains poor.  Encourage oral intake as tolerated  HEMATOLOGIC A:   #Anemia of Chronic Disease-hemoglobin 8.1 P:  - Trend CBC - Transfuse if Hgb < 7 or platelets < 10 - Monitor for s/sx of bleeding - DVT Prophylaxis: SQ heparin   INFECTIOUS A:   #Sepsis due to E. coli bacteremia-WBC up to 14.1. P:   - Trend WBC and monitor fever curve - Blood cultures positive for E. coli - RVP 20 pathogen: negative - Flu/Covid/RSV: negative - Urine culture positive for E. coli  - ABX: Changed to ceftriaxone  and Flagyl  to cover for aspiration pneumonitis - Discontinue vancomycin  - Monitor and repeat cultures if needed  ENDOCRINE A:   #Hypothyroidism-last TSH 10.01 initially, repeat TSH today is 4.69, cortisol level 18.8    P:   - Continue levothyroxine  and repeat TSH with morning labs - Goal range blood glucose 80-1 20-patient is nondiabetic -Monitor closely for hypoglycemia given poor oral intake  Disposition and Family update: Patient is a ward of the state and guardian is unavailable during the weekend.  HIPAA appropriate message left.  Tried to discuss patient's plan of care with him and he was dismissive and did not want to participate.  Best Practice: Diet/type: Regular diet; push as much liquids as possible given patient's poor oral intake VTE prophylaxis:  SCD's /SQ heparin  GI prophylaxis: Protonix  Lines: PIV's and right subclavian dialysis catheter Foley: No; has nephrostomy tube Code Status: Full code Last date of multidisciplinary goals of care discussion 06/01/2024 during interdisciplinary rounds  Larry Bulman S. Griffin, PhD, MSN, MPH, ANP-BC [facility] Prefer epic messenger for cross cover needs If after hours, please call E-link  NB: This document was prepared using Dragon voice recognition software and may include unintentional dictation errors.       "

## 2024-06-01 NOTE — Plan of Care (Signed)
" °  Problem: Clinical Measurements: Goal: Ability to maintain clinical measurements within normal limits will improve Outcome: Progressing Goal: Respiratory complications will improve Outcome: Progressing Goal: Cardiovascular complication will be avoided Outcome: Progressing   Problem: Activity: Goal: Risk for activity intolerance will decrease Outcome: Progressing   Problem: Nutrition: Goal: Adequate nutrition will be maintained Outcome: Progressing   Problem: Coping: Goal: Level of anxiety will decrease Outcome: Progressing   Problem: Pain Managment: Goal: General experience of comfort will improve and/or be controlled Outcome: Progressing   Problem: Clinical Measurements: Goal: Will remain free from infection Outcome: Not Progressing Goal: Diagnostic test results will improve Outcome: Not Progressing   Problem: Elimination: Goal: Will not experience complications related to bowel motility Outcome: Not Progressing   "

## 2024-06-01 NOTE — Plan of Care (Signed)
  Problem: Clinical Measurements: Goal: Respiratory complications will improve Outcome: Progressing Goal: Cardiovascular complication will be avoided Outcome: Progressing   Problem: Pain Managment: Goal: General experience of comfort will improve and/or be controlled Outcome: Progressing   Problem: Safety: Goal: Ability to remain free from injury will improve Outcome: Progressing

## 2024-06-01 NOTE — Progress Notes (Signed)
 " [facility]  ROUNDING NOTE   Subjective:   Larry Morrison is a 54 year old male with past medical conditions including tobacco abuse, anemia, hypertension, TBI, malnutrition, combined heart failure, cocaine abuse, bilateral nephrostomy tubes and end-stage renal disease on hemodialysis.  Patient presents to the emergency department for shortness of breath and has been admitted for Shortness of breath [R06.02] General weakness [R53.1] ESRD on dialysis (HCC) [N18.6, Z99.2] Nephrostomy complication [N99.528] Sepsis (HCC) [A41.9] Elevated procalcitonin [R79.89] . Patient is known to our practice and receives outpatient dialysis treatments at [facility] on a MWF schedule, supervised by Dr. Douglas.    Update:   Patient getting upset with ICU staff. Refusing medications and patient care.   Weaned off norepinephrine  since yesterday morning  Objective:  Vital signs in last 24 hours:  Temp:  [98.2 F (36.8 C)-98.4 F (36.9 C)] 98.3 F (36.8 C) (01/11 0800) Pulse Rate:  [77-103] 93 (01/11 0830) Resp:  [17-43] 23 (01/11 0830) BP: (72-121)/(28-77) 99/61 (01/11 0830) SpO2:  [90 %-100 %] 96 % (01/11 0830) Weight:  [48.8 kg] 48.8 kg (01/11 0500)  Weight change: -0.7 kg Filed Weights   05/30/24 1154 05/31/24 0500 06/01/24 0500  Weight: 49.5 kg 50.1 kg 48.8 kg    Intake/Output: I/O last 3 completed shifts: In: 721.2 [P.O.:50; I.V.:428.4; Other:10; IV Piggyback:232.8] Out: 10 [Urine:10]   Intake/Output this shift:  Total I/O In: 3 [I.V.:3] Out: 10 [Urine:10]  Physical Exam: General: Laying in bed  Head: Normocephalic, atraumatic. Moist oral mucosal membranes  Eyes: Anicteric  Lungs:  Coarse crackles, cough, tachypnea   Heart: Regular rate and rhythm  Abdomen:  Soft, nontender, +urostomy tubes bilaterally  Extremities: 2+ peripheral edema.  Neurologic: Awake, alert, conversant  Skin: Warm,dry, no rash  Access: Left chest IJ PermCath, left AVG nonfunctioning       Basic Metabolic Panel: Recent Labs  Lab 05/28/24 1007 05/29/24 0621 05/30/24 0330 05/31/24 0350 06/01/24 0323  NA 146* 137 137 134* 135  K 4.5 3.7 3.6 3.6 4.1  CL 100 92* 91* 93* 93*  CO2 27 20* 24 23 21*  GLUCOSE 93 89 116* 88 60*  BUN 51* 31* 47* 20 32*  CREATININE 11.30* 6.59* 9.36* 5.46* 8.45*  CALCIUM  9.6 9.2 8.9 8.7* 9.0  MG  --  1.9 1.9  --  2.2  PHOS  --  5.5* 5.0* 3.7 5.3*    Liver Function Tests: Recent Labs  Lab 05/28/24 1007 05/29/24 0621 05/30/24 0330 05/31/24 0350 06/01/24 0323  AST 44*  --   --   --   --   ALT 53*  --   --   --   --   ALKPHOS 79  --   --   --   --   BILITOT 1.0  --   --   --   --   PROT 8.7*  --   --   --   --   ALBUMIN  3.6 3.1* 3.1* 3.0* 2.9*   No results for input(s): LIPASE, AMYLASE in the last 168 hours. No results for input(s): AMMONIA in the last 168 hours.  CBC: Recent Labs  Lab 05/28/24 1007 05/29/24 0621 05/30/24 0330 05/31/24 0350 06/01/24 0323  WBC 16.3* 15.8* 15.1* 12.7* 14.1*  NEUTROABS 13.4*  --   --  9.9*  --   HGB 9.7* 8.5* 8.3* 8.1* 7.5*  HCT 31.1* 27.2* 26.1* 26.4* 24.1*  MCV 102.3* 103.0* 102.4* 103.1* 104.8*  PLT 229 233 207 200 195  Cardiac Enzymes: No results for input(s): CKTOTAL, CKMB, CKMBINDEX, TROPONINI in the last 168 hours.  BNP: Invalid input(s): POCBNP  CBG: Recent Labs  Lab 05/29/24 0459 05/31/24 0735 06/01/24 0552 06/01/24 0623 06/01/24 0751  GLUCAP 90 96 63* 127* 83    Microbiology: Results for orders placed or performed during the hospital encounter of 05/28/24  Resp panel by RT-PCR (RSV, Flu A&B, Covid) Anterior Nasal Swab     Status: None   Collection Time: 05/28/24 10:07 AM   Specimen: Anterior Nasal Swab  Result Value Ref Range Status   SARS Coronavirus 2 by RT PCR NEGATIVE NEGATIVE Final    Comment: (NOTE) SARS-CoV-2 target nucleic acids are NOT DETECTED.  The SARS-CoV-2 RNA is generally detectable in upper respiratory specimens during the  acute phase of infection. The lowest concentration of SARS-CoV-2 viral copies this assay can detect is 138 copies/mL. A negative result does not preclude SARS-Cov-2 infection and should not be used as the sole basis for treatment or other patient management decisions. A negative result may occur with  improper specimen collection/handling, submission of specimen other than nasopharyngeal swab, presence of viral mutation(s) within the areas targeted by this assay, and inadequate number of viral copies(<138 copies/mL). A negative result must be combined with clinical observations, patient history, and epidemiological information. The expected result is Negative.  Fact Sheet for Patients:  bloggercourse.com  Fact Sheet for Healthcare Providers:  seriousbroker.it  This test is no t yet approved or cleared by the [place]  FDA and  has been authorized for detection and/or diagnosis of SARS-CoV-2 by FDA under an Emergency Use Authorization (EUA). This EUA will remain  in effect (meaning this test can be used) for the duration of the COVID-19 declaration under Section 564(b)(1) of the Act, 21 U.S.C.section 360bbb-3(b)(1), unless the authorization is terminated  or revoked sooner.       Influenza A by PCR NEGATIVE NEGATIVE Final   Influenza B by PCR NEGATIVE NEGATIVE Final    Comment: (NOTE) The Xpert Xpress SARS-CoV-2/FLU/RSV plus assay is intended as an aid in the diagnosis of influenza from Nasopharyngeal swab specimens and should not be used as a sole basis for treatment. Nasal washings and aspirates are unacceptable for Xpert Xpress SARS-CoV-2/FLU/RSV testing.  Fact Sheet for Patients: bloggercourse.com  Fact Sheet for Healthcare Providers: seriousbroker.it  This test is not yet approved or cleared by the [place]  FDA and has been authorized for detection and/or  diagnosis of SARS-CoV-2 by FDA under an Emergency Use Authorization (EUA). This EUA will remain in effect (meaning this test can be used) for the duration of the COVID-19 declaration under Section 564(b)(1) of the Act, 21 U.S.C. section 360bbb-3(b)(1), unless the authorization is terminated or revoked.     Resp Syncytial Virus by PCR NEGATIVE NEGATIVE Final    Comment: (NOTE) Fact Sheet for Patients: bloggercourse.com  Fact Sheet for Healthcare Providers: seriousbroker.it  This test is not yet approved or cleared by the [place]  FDA and has been authorized for detection and/or diagnosis of SARS-CoV-2 by FDA under an Emergency Use Authorization (EUA). This EUA will remain in effect (meaning this test can be used) for the duration of the COVID-19 declaration under Section 564(b)(1) of the Act, 21 U.S.C. section 360bbb-3(b)(1), unless the authorization is terminated or revoked.  Performed at [facility], [address] [place], [place] [address]   Respiratory (~20 pathogens) panel by PCR     Status: None   Collection Time: 05/28/24 10:07 AM  Specimen: Nasopharyngeal Swab; Respiratory  Result Value Ref Range Status   Adenovirus NOT DETECTED NOT DETECTED Final   Coronavirus 229E NOT DETECTED NOT DETECTED Final    Comment: (NOTE) The Coronavirus on the Respiratory Panel, DOES NOT test for the novel  Coronavirus (2019 nCoV)    Coronavirus HKU1 NOT DETECTED NOT DETECTED Final   Coronavirus NL63 NOT DETECTED NOT DETECTED Final   Coronavirus OC43 NOT DETECTED NOT DETECTED Final   Metapneumovirus NOT DETECTED NOT DETECTED Final   Rhinovirus / Enterovirus NOT DETECTED NOT DETECTED Final   Influenza A NOT DETECTED NOT DETECTED Final   Influenza B NOT DETECTED NOT DETECTED Final   Parainfluenza Virus 1 NOT DETECTED NOT DETECTED Final   Parainfluenza Virus 2 NOT DETECTED NOT DETECTED Final   Parainfluenza Virus 3 NOT  DETECTED NOT DETECTED Final   Parainfluenza Virus 4 NOT DETECTED NOT DETECTED Final   Respiratory Syncytial Virus NOT DETECTED NOT DETECTED Final   Bordetella pertussis NOT DETECTED NOT DETECTED Final   Bordetella Parapertussis NOT DETECTED NOT DETECTED Final   Chlamydophila pneumoniae NOT DETECTED NOT DETECTED Final   Mycoplasma pneumoniae NOT DETECTED NOT DETECTED Final    Comment: Performed at [facility], [address], [place], [place] [address]  Blood culture (routine x 2)     Status: None (Preliminary result)   Collection Time: 05/28/24 12:52 PM   Specimen: BLOOD  Result Value Ref Range Status   Specimen Description BLOOD RIGHT ANTECUBITAL  Final   Special Requests   Final    BOTTLES DRAWN AEROBIC AND ANAEROBIC Blood Culture results may not be optimal due to an inadequate volume of blood received in culture bottles   Culture   Final    NO GROWTH 4 DAYS Performed at [facility], [address] [place], [place] [address]    Report Status PENDING  Incomplete  Urine Culture     Status: Abnormal   Collection Time: 05/28/24 12:52 PM   Specimen: Urine, Catheterized  Result Value Ref Range Status   Specimen Description   Final    URINE, CATHETERIZED Performed at [facility], [address] [place], [place] [address]    Special Requests   Final    NONE Performed at [facility], [address] [place], [place] [address]    Culture (A)  Final    >=100,000 COLONIES/mL ESCHERICHIA COLI >=100,000 COLONIES/mL CITROBACTER KOSERI    Report Status 05/31/2024 FINAL  Final   Organism ID, Bacteria ESCHERICHIA COLI (A)  Final   Organism ID, Bacteria CITROBACTER KOSERI (A)  Final      Susceptibility   Citrobacter koseri - MIC*    CEFEPIME  <=0.12 SENSITIVE Sensitive     ERTAPENEM <=0.12 SENSITIVE Sensitive     CEFTRIAXONE  <=0.25 SENSITIVE Sensitive     CIPROFLOXACIN  <=0.06 SENSITIVE Sensitive     GENTAMICIN <=1 SENSITIVE Sensitive      NITROFURANTOIN  32 SENSITIVE Sensitive     TRIMETH /SULFA  <=20 SENSITIVE Sensitive     PIP/TAZO Value in next row Sensitive      <=4 SENSITIVEThis is a modified FDA-approved test that has been validated and its performance characteristics determined by the reporting laboratory.  This laboratory is certified under the Clinical Laboratory Improvement Amendments CLIA as qualified to perform high complexity clinical laboratory testing.    MEROPENEM Value in next row Sensitive      <=4 SENSITIVEThis is a modified FDA-approved test that has been validated and its performance characteristics determined by the  reporting laboratory.  This laboratory is certified under the Clinical Laboratory Improvement Amendments CLIA as qualified to perform high complexity clinical laboratory testing.    * >=100,000 COLONIES/mL CITROBACTER KOSERI   Escherichia coli - MIC*    AMPICILLIN Value in next row Sensitive      <=4 SENSITIVEThis is a modified FDA-approved test that has been validated and its performance characteristics determined by the reporting laboratory.  This laboratory is certified under the Clinical Laboratory Improvement Amendments CLIA as qualified to perform high complexity clinical laboratory testing.    CEFAZOLIN  (URINE) Value in next row Sensitive      2 SENSITIVEThis is a modified FDA-approved test that has been validated and its performance characteristics determined by the reporting laboratory.  This laboratory is certified under the Clinical Laboratory Improvement Amendments CLIA as qualified to perform high complexity clinical laboratory testing.    CEFEPIME  Value in next row Sensitive      2 SENSITIVEThis is a modified FDA-approved test that has been validated and its performance characteristics determined by the reporting laboratory.  This laboratory is certified under the Clinical Laboratory Improvement Amendments CLIA as qualified to perform high complexity clinical laboratory testing.    ERTAPENEM  Value in next row Sensitive      2 SENSITIVEThis is a modified FDA-approved test that has been validated and its performance characteristics determined by the reporting laboratory.  This laboratory is certified under the Clinical Laboratory Improvement Amendments CLIA as qualified to perform high complexity clinical laboratory testing.    CEFTRIAXONE  Value in next row Sensitive      2 SENSITIVEThis is a modified FDA-approved test that has been validated and its performance characteristics determined by the reporting laboratory.  This laboratory is certified under the Clinical Laboratory Improvement Amendments CLIA as qualified to perform high complexity clinical laboratory testing.    CIPROFLOXACIN  Value in next row Sensitive      2 SENSITIVEThis is a modified FDA-approved test that has been validated and its performance characteristics determined by the reporting laboratory.  This laboratory is certified under the Clinical Laboratory Improvement Amendments CLIA as qualified to perform high complexity clinical laboratory testing.    GENTAMICIN Value in next row Sensitive      2 SENSITIVEThis is a modified FDA-approved test that has been validated and its performance characteristics determined by the reporting laboratory.  This laboratory is certified under the Clinical Laboratory Improvement Amendments CLIA as qualified to perform high complexity clinical laboratory testing.    NITROFURANTOIN  Value in next row Sensitive      2 SENSITIVEThis is a modified FDA-approved test that has been validated and its performance characteristics determined by the reporting laboratory.  This laboratory is certified under the Clinical Laboratory Improvement Amendments CLIA as qualified to perform high complexity clinical laboratory testing.    TRIMETH /SULFA  Value in next row Sensitive      2 SENSITIVEThis is a modified FDA-approved test that has been validated and its performance characteristics determined by the  reporting laboratory.  This laboratory is certified under the Clinical Laboratory Improvement Amendments CLIA as qualified to perform high complexity clinical laboratory testing.    AMPICILLIN/SULBACTAM Value in next row Sensitive      2 SENSITIVEThis is a modified FDA-approved test that has been validated and its performance characteristics determined by the reporting laboratory.  This laboratory is certified under the Clinical Laboratory Improvement Amendments CLIA as qualified to perform high complexity clinical laboratory testing.    PIP/TAZO Value in next row Sensitive      <=  4 SENSITIVEThis is a modified FDA-approved test that has been validated and its performance characteristics determined by the reporting laboratory.  This laboratory is certified under the Clinical Laboratory Improvement Amendments CLIA as qualified to perform high complexity clinical laboratory testing.    MEROPENEM Value in next row Sensitive      <=4 SENSITIVEThis is a modified FDA-approved test that has been validated and its performance characteristics determined by the reporting laboratory.  This laboratory is certified under the Clinical Laboratory Improvement Amendments CLIA as qualified to perform high complexity clinical laboratory testing.    * >=100,000 COLONIES/mL ESCHERICHIA COLI  Expectorated Sputum Assessment w Gram Stain, Rflx to Resp Cult     Status: None   Collection Time: 05/28/24  1:18 PM   Specimen: Salivary Gland; Sputum  Result Value Ref Range Status   Specimen Description SALIVA  Final   Special Requests EXPSU  Final   Sputum evaluation   Final    THIS SPECIMEN IS ACCEPTABLE FOR SPUTUM CULTURE Performed at [facility], [address] [place], [place] [address]    Report Status 05/29/2024 FINAL  Final  Culture, Respiratory w Gram Stain     Status: None (Preliminary result)   Collection Time: 05/28/24  1:18 PM  Result Value Ref Range Status   Specimen Description EXPECTORATED SPUTUM   Final   Special Requests Reflexed from [id number]  Final   Gram Stain   Final    FEW WBC PRESENT, PREDOMINANTLY PMN RARE GRAM NEGATIVE RODS    Culture   Final    CULTURE REINCUBATED FOR BETTER GROWTH Performed at [facility], [address], [place], [place] [address]    Report Status PENDING  Incomplete  MRSA Next Gen by PCR, Nasal     Status: None   Collection Time: 05/28/24  9:40 PM   Specimen: Nasal Mucosa; Nasal Swab  Result Value Ref Range Status   MRSA by PCR Next Gen NOT DETECTED NOT DETECTED Final    Comment: (NOTE) The GeneXpert MRSA Assay (FDA approved for NASAL specimens only), is one component of a comprehensive MRSA colonization surveillance program. It is not intended to diagnose MRSA infection nor to guide or monitor treatment for MRSA infections. Test performance is not FDA approved in patients less than 73 years old. Performed at [facility], [address] [place], [place] [address]   Blood culture (routine x 2)     Status: None (Preliminary result)   Collection Time: 05/28/24 10:24 PM   Specimen: BLOOD  Result Value Ref Range Status   Specimen Description BLOOD BLOOD RIGHT HAND  Final   Special Requests   Final    BOTTLES DRAWN AEROBIC ONLY Blood Culture results may not be optimal due to an inadequate volume of blood received in culture bottles   Culture   Final    NO GROWTH 4 DAYS Performed at [facility], [address] [place], [place] [address]    Report Status PENDING  Incomplete  Body fluid culture w Gram Stain     Status: None (Preliminary result)   Collection Time: 05/29/24  8:00 AM   Specimen: Other Source; Body Fluid  Result Value Ref Range Status   Specimen Description   Final    OTHER Performed at [facility], [address] [place], [place] [address]    Special Requests   Final    NONE Performed at [facility], [address] [place], [place] [address]  Gram Stain   Final    FEW WBC  PRESENT, PREDOMINANTLY PMN MODERATE GRAM POSITIVE COCCI FEW GRAM NEGATIVE RODS    Culture   Final    CULTURE REINCUBATED FOR BETTER GROWTH ABUNDANT ESCHERICHIA COLI ABUNDANT CITROBACTER KOSERI FEW PROTEUS MIRABILIS ATTEMPTING TO ISOLATE FOR WORKUP. Performed at [facility], [address] [place], [place] [address]    Report Status PENDING  Incomplete    Coagulation Studies: No results for input(s): LABPROT, INR in the last 72 hours.  Urinalysis: No results for input(s): COLORURINE, LABSPEC, PHURINE, GLUCOSEU, HGBUR, BILIRUBINUR, KETONESUR, PROTEINUR, UROBILINOGEN, NITRITE, LEUKOCYTESUR in the last 72 hours.  Invalid input(s): APPERANCEUR     Imaging: DG Chest Port 1 View Result Date: 06/01/2024 CLINICAL DATA:  Right-sided rib pain. EXAM: PORTABLE CHEST 1 VIEW COMPARISON:  05/28/2024 FINDINGS: The cardio pericardial silhouette is enlarged. Similar left base collapse/consolidation with small left effusion. Similar patchy atelectasis or infiltrate at the right base with possible tiny right effusion. No overt pulmonary edema. Left IJ central line tip overlies the right atrium. Vascular stent device noted in each axillary region. Telemetry leads overlie the chest. IMPRESSION: 1. Similar left base collapse/consolidation with small left effusion. 2. Similar patchy atelectasis or infiltrate at the right base with possible tiny right effusion. Electronically Signed   By: Camellia Candle M.D.   On: 06/01/2024 09:29   DG Abd 1 View Result Date: 06/01/2024 CLINICAL DATA:  Right sided rib and abdominal pain. EXAM: ABDOMEN - 1 VIEW COMPARISON:  04/16/2021 FINDINGS: Mild convex leftward lumbar scoliosis evident. Right percutaneous pigtail drain overlies the central right upper quadrant of the abdomen. Diffuse gas-filled bowel loops evident in a nonobstructive pattern. Gas is seen scattered along the length of the colon from the cecal tip down to the rectum.  IMPRESSION: Diffuse gas-filled bowel loops in a nonobstructive pattern. Right percutaneous nephrostomy tube evident. Electronically Signed   By: Camellia Candle M.D.   On: 06/01/2024 09:28   ECHOCARDIOGRAM COMPLETE Result Date: 05/30/2024    ECHOCARDIOGRAM REPORT   Patient Name:   Larry Morrison Date of Exam: 05/30/2024 Medical Rec #:  [id number]    Height:       64.0 in Accession #:    [id number]   Weight:       109.1 lb Date of Birth:  Jun 24, 1970     BSA:          1.512 m Patient Age:    53 years     BP:           95/63 mmHg Patient Gender: M            HR:           79 bpm. Exam Location:  [facility] Procedure: 2D Echo, Cardiac Doppler and Color Doppler (Both Spectral and Color            Flow Doppler were utilized during procedure). Indications:     Congestive Heart Failure I50.9  History:         Patient has prior history of Echocardiogram examinations, most                  recent 04/12/2021. CHF.  Sonographer:     Ashley McNeely-Sloane Referring Phys:  [id number] ROBET KIM Diagnosing Phys: Caron Poser  Sonographer Comments: Technically difficult study due to poor echo windows. Image acquisition challenging due to uncooperative patient. IMPRESSIONS  1. Limited views available due to patient cooperation. No apical windows.  2. Left ventricular  ejection fraction, by estimation, is 35 to 40%. The left ventricle has moderately decreased function. Left ventricular endocardial border not optimally defined to evaluate regional wall motion. Left ventricular diastolic parameters are consistent with Grade I diastolic dysfunction (impaired relaxation).  3. Right ventricular systolic function is normal. The right ventricular size is normal.  4. There is no evidence of pericardial effusion. Unable to exclude an apical effusion given lack of apical window.  5. The mitral valve is normal in structure. Trivial mitral valve regurgitation. No evidence of mitral stenosis.  6. The aortic valve has an indeterminant number of cusps. There  is mild calcification of the aortic valve. Aortic valve regurgitation is trivial. Aortic valve sclerosis/calcification is present, without any evidence of aortic stenosis.  7. The inferior vena cava is normal in size with greater than 50% respiratory variability, suggesting right atrial pressure of 3 mmHg. Comparison(s): A prior study was performed on 04/12/2021. LV function has improved based on limited views available. FINDINGS  Left Ventricle: Left ventricular ejection fraction, by estimation, is 35 to 40%. The left ventricle has moderately decreased function. Left ventricular endocardial border not optimally defined to evaluate regional wall motion. The left ventricular internal cavity size was normal in size. There is no left ventricular hypertrophy. Left ventricular diastolic parameters are consistent with Grade I diastolic dysfunction (impaired relaxation). Right Ventricle: The right ventricular size is normal. No increase in right ventricular wall thickness. Right ventricular systolic function is normal. Left Atrium: Left atrial size was not well visualized. Right Atrium: Right atrial size was not well visualized. Pericardium: There is no evidence of pericardial effusion. Mitral Valve: The mitral valve is normal in structure. Trivial mitral valve regurgitation. No evidence of mitral valve stenosis. MV peak gradient, 3.6 mmHg. The mean mitral valve gradient is 2.0 mmHg. Tricuspid Valve: The tricuspid valve is normal in structure. Tricuspid valve regurgitation is trivial. Aortic Valve: The aortic valve has an indeterminant number of cusps. There is mild calcification of the aortic valve. Aortic valve regurgitation is trivial. Aortic valve sclerosis/calcification is present, without any evidence of aortic stenosis. Aortic valve mean gradient measures 3.0 mmHg. Aortic valve peak gradient measures 5.0 mmHg. Aortic valve area, by VTI measures 1.92 cm. Pulmonic Valve: The pulmonic valve was not well visualized.  Pulmonic valve regurgitation is not visualized. No evidence of pulmonic stenosis. Aorta: The aortic root is normal in size and structure. Venous: The inferior vena cava is normal in size with greater than 50% respiratory variability, suggesting right atrial pressure of 3 mmHg. IAS/Shunts: The interatrial septum was not well visualized.  LEFT VENTRICLE PLAX 2D LVIDd:         4.60 cm   Diastology LVIDs:         2.90 cm   LV e' medial:    5.00 cm/s LV PW:         0.60 cm   LV E/e' medial:  13.0 LV IVS:        1.00 cm   LV e' lateral:   5.11 cm/s LVOT diam:     1.80 cm   LV E/e' lateral: 12.7 LV SV:         36 LV SV Index:   24 LVOT Area:     2.54 cm  RIGHT VENTRICLE RV S prime:     20.90 cm/s TAPSE (M-mode): 2.0 cm LEFT ATRIUM         Index LA diam:    2.80 cm 1.85 cm/m  AORTIC VALVE  PULMONIC VALVE AV Area (Vmax):    1.81 cm     PV Vmax:        0.93 m/s AV Area (Vmean):   1.70 cm     PV Vmean:       61.600 cm/s AV Area (VTI):     1.92 cm     PV VTI:         0.174 m AV Vmax:           112.00 cm/s  PV Peak grad:   3.5 mmHg AV Vmean:          75.100 cm/s  PV Mean grad:   2.0 mmHg AV VTI:            0.186 m      RVOT Peak grad: 2 mmHg AV Peak Grad:      5.0 mmHg AV Mean Grad:      3.0 mmHg LVOT Vmax:         79.85 cm/s LVOT Vmean:        50.200 cm/s LVOT VTI:          0.140 m LVOT/AV VTI ratio: 0.76  AORTA Ao Root diam: 3.30 cm MITRAL VALVE MV Area (PHT): 3.17 cm    SHUNTS MV Area VTI:   1.72 cm    Systemic VTI:  0.14 m MV Peak grad:  3.6 mmHg    Systemic Diam: 1.80 cm MV Mean grad:  2.0 mmHg    Pulmonic VTI:  0.139 m MV Vmax:       0.95 m/s MV Vmean:      66.4 cm/s MV Decel Time: 239 msec MV E velocity: 64.90 cm/s MV A velocity: 93.50 cm/s MV E/A ratio:  0.69 [place] Electronically signed by Caron Poser Signature Date/Time: 05/30/2024/1:17:00 PM    Final       Medications:    sodium chloride  Stopped (05/31/24 2211)   cefTRIAXone  (ROCEPHIN )  IV 1 g (06/01/24 0925)    aspirin  EC   81 mg Oral Daily   atorvastatin   40 mg Oral Daily   calcium  acetate  1,334 mg Oral TID WC   Chlorhexidine  Gluconate Cloth  6 each Topical Q0600   dextromethorphan -guaiFENesin   1 tablet Oral BID   epoetin  alfa-epbx (RETACRIT ) injection  10,000 Units Intravenous Q M,W,F-1800   feeding supplement (NEPRO CARB STEADY)  237 mL Oral TID BM   heparin   5,000 Units Subcutaneous Q8H   levothyroxine   50 mcg Oral Daily   lidocaine -prilocaine   1 Application Topical Once per day on Monday Wednesday Friday   midodrine   10 mg Oral TID WC   multivitamin  1 tablet Oral QHS   mouth rinse  15 mL Mouth Rinse 4 times per day   pantoprazole   40 mg Oral Daily   sodium chloride  flush  3 mL Intravenous Q12H   acetaminophen  **OR** acetaminophen , diphenhydrAMINE , famotidine , heparin , HYDROmorphone  (DILAUDID ) injection, ipratropium-albuterol , lidocaine -prilocaine , methylPREDNISolone  (SOLU-MEDROL ) injection, midazolam , mouth rinse, mouth rinse, oxyCODONE , pentafluoroprop-tetrafluoroeth  Assessment/ Plan:  Larry Morrison is a 54 y.o.  male with past medical conditions including tobacco abuse, anemia, hypertension, TBI, malnutrition, combined heart failure, cocaine abuse, bilateral nephrostomy tubes and end-stage renal disease on hemodialysis.  Patient presents to the emergency department for shortness of breath and has been admitted for Shortness of breath [R06.02] General weakness [R53.1] ESRD on dialysis (HCC) [N18.6, Z99.2] Nephrostomy complication [N99.528] Sepsis (HCC) [A41.9] Elevated procalcitonin [R79.89]    End-stage renal disease on hemodialysis. Next treatment for Monday. Orders  prepared.   2.  Sepsis with E. Coli and Citrobacter species growing from urine.  With hypotension requiring vasopressors.  IR planning nephrostomy exchange this admission - ceftriaxone  and vancomycin  - weaned off norepinephrine .   3. Anemia of chronic kidney disease Lab Results  Component Value Date   HGB 7.5 (L)  06/01/2024    ESA with HD treatments  4. Secondary Hyperparathyroidism: with outpatient labs: PTH 438, phosphorus 5.9, calcium  8.3 on 04/28/24.   Lab Results  Component Value Date   CALCIUM  9.0 06/01/2024   CAION 0.79 (LL) 09/23/2021   PHOS 5.3 (H) 06/01/2024    Patient prescribed calcitriol  and calcium  acetate outpatient. Will continue to monitor.   5.  Hypotensive with history of hypertension with chronic kidney disease.  Home regimen includes carvedilol  and irbesartan  being held - weaned off vasopressor: norepinephrine .  - midodrine .    LOS: 4 Larry Morrison 1/11/202612:03 PM   "

## 2024-06-01 NOTE — Progress Notes (Signed)
 Emptied the right nephrostomy bag and there was only 10ml of urine. The urine was yellow/tan in color with lots of mucous, thick, almost a purulent look to it. The smell was rancid. Took pictures and placed in chart. Jerre Beagle, NP aware of same.

## 2024-06-02 ENCOUNTER — Inpatient Hospital Stay: Admitting: Radiology

## 2024-06-02 DIAGNOSIS — J9601 Acute respiratory failure with hypoxia: Secondary | ICD-10-CM

## 2024-06-02 DIAGNOSIS — J90 Pleural effusion, not elsewhere classified: Secondary | ICD-10-CM

## 2024-06-02 DIAGNOSIS — A4151 Sepsis due to Escherichia coli [E. coli]: Secondary | ICD-10-CM | POA: Diagnosis not present

## 2024-06-02 DIAGNOSIS — R6521 Severe sepsis with septic shock: Secondary | ICD-10-CM | POA: Diagnosis not present

## 2024-06-02 HISTORY — PX: IR NEPHROSTOMY EXCHANGE RIGHT: IMG6070

## 2024-06-02 LAB — CULTURE, BLOOD (ROUTINE X 2)
Culture: NO GROWTH
Culture: NO GROWTH

## 2024-06-02 LAB — CBC
HCT: 27.1 % — ABNORMAL LOW (ref 39.0–52.0)
Hemoglobin: 8.1 g/dL — ABNORMAL LOW (ref 13.0–17.0)
MCH: 32 pg (ref 26.0–34.0)
MCHC: 29.9 g/dL — ABNORMAL LOW (ref 30.0–36.0)
MCV: 107.1 fL — ABNORMAL HIGH (ref 80.0–100.0)
Platelets: 220 K/uL (ref 150–400)
RBC: 2.53 MIL/uL — ABNORMAL LOW (ref 4.22–5.81)
RDW: 14.3 % (ref 11.5–15.5)
WBC: 15.1 K/uL — ABNORMAL HIGH (ref 4.0–10.5)
nRBC: 0.2 % (ref 0.0–0.2)

## 2024-06-02 LAB — BASIC METABOLIC PANEL WITH GFR
Anion gap: 25 — ABNORMAL HIGH (ref 5–15)
BUN: 46 mg/dL — ABNORMAL HIGH (ref 6–20)
CO2: 23 mmol/L (ref 22–32)
Calcium: 9.2 mg/dL (ref 8.9–10.3)
Chloride: 92 mmol/L — ABNORMAL LOW (ref 98–111)
Creatinine, Ser: 11.5 mg/dL — ABNORMAL HIGH (ref 0.61–1.24)
GFR, Estimated: 5 mL/min — ABNORMAL LOW
Glucose, Bld: 76 mg/dL (ref 70–99)
Potassium: 3.6 mmol/L (ref 3.5–5.1)
Sodium: 140 mmol/L (ref 135–145)

## 2024-06-02 LAB — MAGNESIUM: Magnesium: 2.3 mg/dL (ref 1.7–2.4)

## 2024-06-02 MED ORDER — MIDAZOLAM HCL 2 MG/2ML IJ SOLN
INTRAMUSCULAR | Status: AC
  Filled 2024-06-02: qty 2

## 2024-06-02 MED ORDER — LIDOCAINE HCL 1 % IJ SOLN
5.0000 mL | Freq: Once | INTRAMUSCULAR | Status: AC
  Administered 2024-06-02: 5 mL via INTRADERMAL

## 2024-06-02 MED ORDER — PROCHLORPERAZINE EDISYLATE 10 MG/2ML IJ SOLN
10.0000 mg | Freq: Once | INTRAMUSCULAR | Status: AC
  Administered 2024-06-02: 10 mg via INTRAVENOUS
  Filled 2024-06-02: qty 2

## 2024-06-02 MED ORDER — FENTANYL CITRATE (PF) 100 MCG/2ML IJ SOLN
INTRAMUSCULAR | Status: AC | PRN
  Administered 2024-06-02: 50 ug via INTRAVENOUS

## 2024-06-02 MED ORDER — MIDAZOLAM HCL (PF) 2 MG/2ML IJ SOLN
INTRAMUSCULAR | Status: AC | PRN
  Administered 2024-06-02: 1 mg via INTRAVENOUS

## 2024-06-02 MED ORDER — FENTANYL CITRATE (PF) 100 MCG/2ML IJ SOLN
INTRAMUSCULAR | Status: AC
  Filled 2024-06-02: qty 2

## 2024-06-02 MED ORDER — LIDOCAINE HCL 1 % IJ SOLN
INTRAMUSCULAR | Status: AC
  Filled 2024-06-02: qty 20

## 2024-06-02 MED ORDER — IOHEXOL 300 MG/ML  SOLN
15.0000 mL | Freq: Once | INTRAMUSCULAR | Status: AC | PRN
  Administered 2024-06-02: 15 mL

## 2024-06-02 NOTE — Progress Notes (Signed)
 " [facility]  ROUNDING NOTE   Subjective:   Larry Morrison is a 54 year old male with past medical conditions including tobacco abuse, anemia, hypertension, TBI, malnutrition, combined heart failure, cocaine abuse, bilateral nephrostomy tubes and end-stage renal disease on hemodialysis.  Patient presents to the emergency department for shortness of breath and has been admitted for Shortness of breath [R06.02] General weakness [R53.1] ESRD on dialysis (HCC) [N18.6, Z99.2] Nephrostomy complication [N99.528] Sepsis (HCC) [A41.9] Elevated procalcitonin [R79.89] . Patient is known to our practice and receives outpatient dialysis treatments at [facility] on a MWF schedule, supervised by Dr. Douglas.    Update:   Patient seen laying in bed No family present Eyes partially open Aroused for short periods States he's tired  Objective:  Vital signs in last 24 hours:  Temp:  [97.5 F (36.4 C)-98.6 F (37 C)] 98.4 F (36.9 C) (01/12 1152) Pulse Rate:  [0-104] 0 (01/12 1236) Resp:  [0-36] 0 (01/12 1236) BP: (93-141)/(54-80) 97/54 (01/12 1236) SpO2:  [0 %-100 %] 0 % (01/12 1236) Weight:  [48.8 kg] 48.8 kg (01/12 0500)  Weight change: 0 kg Filed Weights   05/31/24 0500 06/01/24 0500 06/02/24 0500  Weight: 50.1 kg 48.8 kg 48.8 kg    Intake/Output: I/O last 3 completed shifts: In: 355.8 [P.O.:80; I.V.:45.4; IV Piggyback:230.4] Out: 10 [Urine:10]   Intake/Output this shift:  No intake/output data recorded.  Physical Exam: General: Laying in bed  Head: Normocephalic, atraumatic. Moist oral mucosal membranes  Eyes: Anicteric  Lungs:  Coarse crackles, cough, tachypnea   Heart: Regular rate and rhythm  Abdomen:  Soft, nontender, +urostomy tubes bilaterally  Extremities: 2+ peripheral edema.  Neurologic: Arousable for short periods.  Skin: Warm,dry, no rash  Access: Left chest IJ PermCath, left AVG nonfunctioning      Basic Metabolic Panel: Recent Labs  Lab  05/29/24 0621 05/30/24 0330 05/31/24 0350 06/01/24 0323 06/02/24 0537  NA 137 137 134* 135 140  K 3.7 3.6 3.6 4.1 3.6  CL 92* 91* 93* 93* 92*  CO2 20* 24 23 21* 23  GLUCOSE 89 116* 88 60* 76  BUN 31* 47* 20 32* 46*  CREATININE 6.59* 9.36* 5.46* 8.45* 11.50*  CALCIUM  9.2 8.9 8.7* 9.0 9.2  MG 1.9 1.9  --  2.2 2.3  PHOS 5.5* 5.0* 3.7 5.3*  --     Liver Function Tests: Recent Labs  Lab 05/28/24 1007 05/29/24 0621 05/30/24 0330 05/31/24 0350 06/01/24 0323  AST 44*  --   --   --   --   ALT 53*  --   --   --   --   ALKPHOS 79  --   --   --   --   BILITOT 1.0  --   --   --   --   PROT 8.7*  --   --   --   --   ALBUMIN  3.6 3.1* 3.1* 3.0* 2.9*   No results for input(s): LIPASE, AMYLASE in the last 168 hours. No results for input(s): AMMONIA in the last 168 hours.  CBC: Recent Labs  Lab 05/28/24 1007 05/29/24 0621 05/30/24 0330 05/31/24 0350 06/01/24 0323 06/02/24 0537  WBC 16.3* 15.8* 15.1* 12.7* 14.1* 15.1*  NEUTROABS 13.4*  --   --  9.9*  --   --   HGB 9.7* 8.5* 8.3* 8.1* 7.5* 8.1*  HCT 31.1* 27.2* 26.1* 26.4* 24.1* 27.1*  MCV 102.3* 103.0* 102.4* 103.1* 104.8* 107.1*  PLT 229 233 207 200 195  220    Cardiac Enzymes: No results for input(s): CKTOTAL, CKMB, CKMBINDEX, TROPONINI in the last 168 hours.  BNP: Invalid input(s): POCBNP  CBG: Recent Labs  Lab 05/29/24 0459 05/31/24 0735 06/01/24 0552 06/01/24 0623 06/01/24 0751  GLUCAP 90 96 63* 127* 83    Microbiology: Results for orders placed or performed during the hospital encounter of 05/28/24  Resp panel by RT-PCR (RSV, Flu A&B, Covid) Anterior Nasal Swab     Status: None   Collection Time: 05/28/24 10:07 AM   Specimen: Anterior Nasal Swab  Result Value Ref Range Status   SARS Coronavirus 2 by RT PCR NEGATIVE NEGATIVE Final    Comment: (NOTE) SARS-CoV-2 target nucleic acids are NOT DETECTED.  The SARS-CoV-2 RNA is generally detectable in upper respiratory specimens during the  acute phase of infection. The lowest concentration of SARS-CoV-2 viral copies this assay can detect is 138 copies/mL. A negative result does not preclude SARS-Cov-2 infection and should not be used as the sole basis for treatment or other patient management decisions. A negative result may occur with  improper specimen collection/handling, submission of specimen other than nasopharyngeal swab, presence of viral mutation(s) within the areas targeted by this assay, and inadequate number of viral copies(<138 copies/mL). A negative result must be combined with clinical observations, patient history, and epidemiological information. The expected result is Negative.  Fact Sheet for Patients:  bloggercourse.com  Fact Sheet for Healthcare Providers:  seriousbroker.it  This test is no t yet approved or cleared by the [place]  FDA and  has been authorized for detection and/or diagnosis of SARS-CoV-2 by FDA under an Emergency Use Authorization (EUA). This EUA will remain  in effect (meaning this test can be used) for the duration of the COVID-19 declaration under Section 564(b)(1) of the Act, 21 U.S.C.section 360bbb-3(b)(1), unless the authorization is terminated  or revoked sooner.       Influenza A by PCR NEGATIVE NEGATIVE Final   Influenza B by PCR NEGATIVE NEGATIVE Final    Comment: (NOTE) The Xpert Xpress SARS-CoV-2/FLU/RSV plus assay is intended as an aid in the diagnosis of influenza from Nasopharyngeal swab specimens and should not be used as a sole basis for treatment. Nasal washings and aspirates are unacceptable for Xpert Xpress SARS-CoV-2/FLU/RSV testing.  Fact Sheet for Patients: bloggercourse.com  Fact Sheet for Healthcare Providers: seriousbroker.it  This test is not yet approved or cleared by the [place]  FDA and has been authorized for detection and/or  diagnosis of SARS-CoV-2 by FDA under an Emergency Use Authorization (EUA). This EUA will remain in effect (meaning this test can be used) for the duration of the COVID-19 declaration under Section 564(b)(1) of the Act, 21 U.S.C. section 360bbb-3(b)(1), unless the authorization is terminated or revoked.     Resp Syncytial Virus by PCR NEGATIVE NEGATIVE Final    Comment: (NOTE) Fact Sheet for Patients: bloggercourse.com  Fact Sheet for Healthcare Providers: seriousbroker.it  This test is not yet approved or cleared by the [place]  FDA and has been authorized for detection and/or diagnosis of SARS-CoV-2 by FDA under an Emergency Use Authorization (EUA). This EUA will remain in effect (meaning this test can be used) for the duration of the COVID-19 declaration under Section 564(b)(1) of the Act, 21 U.S.C. section 360bbb-3(b)(1), unless the authorization is terminated or revoked.  Performed at [facility], [address], [place], [place] [address]   Respiratory (~20 pathogens) panel by PCR     Status: None   Collection Time: 05/28/24  10:07 AM   Specimen: Nasopharyngeal Swab; Respiratory  Result Value Ref Range Status   Adenovirus NOT DETECTED NOT DETECTED Final   Coronavirus 229E NOT DETECTED NOT DETECTED Final    Comment: (NOTE) The Coronavirus on the Respiratory Panel, DOES NOT test for the novel  Coronavirus (2019 nCoV)    Coronavirus HKU1 NOT DETECTED NOT DETECTED Final   Coronavirus NL63 NOT DETECTED NOT DETECTED Final   Coronavirus OC43 NOT DETECTED NOT DETECTED Final   Metapneumovirus NOT DETECTED NOT DETECTED Final   Rhinovirus / Enterovirus NOT DETECTED NOT DETECTED Final   Influenza A NOT DETECTED NOT DETECTED Final   Influenza B NOT DETECTED NOT DETECTED Final   Parainfluenza Virus 1 NOT DETECTED NOT DETECTED Final   Parainfluenza Virus 2 NOT DETECTED NOT DETECTED Final   Parainfluenza Virus 3 NOT  DETECTED NOT DETECTED Final   Parainfluenza Virus 4 NOT DETECTED NOT DETECTED Final   Respiratory Syncytial Virus NOT DETECTED NOT DETECTED Final   Bordetella pertussis NOT DETECTED NOT DETECTED Final   Bordetella Parapertussis NOT DETECTED NOT DETECTED Final   Chlamydophila pneumoniae NOT DETECTED NOT DETECTED Final   Mycoplasma pneumoniae NOT DETECTED NOT DETECTED Final    Comment: Performed at [facility], [address], [place], [place] [address]  Blood culture (routine x 2)     Status: None   Collection Time: 05/28/24 12:52 PM   Specimen: BLOOD  Result Value Ref Range Status   Specimen Description BLOOD RIGHT ANTECUBITAL  Final   Special Requests   Final    BOTTLES DRAWN AEROBIC AND ANAEROBIC Blood Culture results may not be optimal due to an inadequate volume of blood received in culture bottles   Culture   Final    NO GROWTH 5 DAYS Performed at [facility], [address] [place], [place] [address]    Report Status 06/02/2024 FINAL  Final  Urine Culture     Status: Abnormal   Collection Time: 05/28/24 12:52 PM   Specimen: Urine, Catheterized  Result Value Ref Range Status   Specimen Description   Final    URINE, CATHETERIZED Performed at [facility], [address], [place], [place] [address]    Special Requests   Final    NONE Performed at [facility], [address] [place], [place] [address]    Culture (A)  Final    >=100,000 COLONIES/mL ESCHERICHIA COLI >=100,000 COLONIES/mL CITROBACTER KOSERI    Report Status 05/31/2024 FINAL  Final   Organism ID, Bacteria ESCHERICHIA COLI (A)  Final   Organism ID, Bacteria CITROBACTER KOSERI (A)  Final      Susceptibility   Citrobacter koseri - MIC*    CEFEPIME  <=0.12 SENSITIVE Sensitive     ERTAPENEM <=0.12 SENSITIVE Sensitive     CEFTRIAXONE  <=0.25 SENSITIVE Sensitive     CIPROFLOXACIN  <=0.06 SENSITIVE Sensitive     GENTAMICIN <=1 SENSITIVE Sensitive     NITROFURANTOIN  32  SENSITIVE Sensitive     TRIMETH /SULFA  <=20 SENSITIVE Sensitive     PIP/TAZO Value in next row Sensitive      <=4 SENSITIVEThis is a modified FDA-approved test that has been validated and its performance characteristics determined by the reporting laboratory.  This laboratory is certified under the Clinical Laboratory Improvement Amendments CLIA as qualified to perform high complexity clinical laboratory testing.    MEROPENEM Value in next row Sensitive      <=4 SENSITIVEThis is a modified FDA-approved test that has been validated and its performance characteristics  determined by the reporting laboratory.  This laboratory is certified under the Clinical Laboratory Improvement Amendments CLIA as qualified to perform high complexity clinical laboratory testing.    * >=100,000 COLONIES/mL CITROBACTER KOSERI   Escherichia coli - MIC*    AMPICILLIN Value in next row Sensitive      <=4 SENSITIVEThis is a modified FDA-approved test that has been validated and its performance characteristics determined by the reporting laboratory.  This laboratory is certified under the Clinical Laboratory Improvement Amendments CLIA as qualified to perform high complexity clinical laboratory testing.    CEFAZOLIN  (URINE) Value in next row Sensitive      2 SENSITIVEThis is a modified FDA-approved test that has been validated and its performance characteristics determined by the reporting laboratory.  This laboratory is certified under the Clinical Laboratory Improvement Amendments CLIA as qualified to perform high complexity clinical laboratory testing.    CEFEPIME  Value in next row Sensitive      2 SENSITIVEThis is a modified FDA-approved test that has been validated and its performance characteristics determined by the reporting laboratory.  This laboratory is certified under the Clinical Laboratory Improvement Amendments CLIA as qualified to perform high complexity clinical laboratory testing.    ERTAPENEM Value in next row  Sensitive      2 SENSITIVEThis is a modified FDA-approved test that has been validated and its performance characteristics determined by the reporting laboratory.  This laboratory is certified under the Clinical Laboratory Improvement Amendments CLIA as qualified to perform high complexity clinical laboratory testing.    CEFTRIAXONE  Value in next row Sensitive      2 SENSITIVEThis is a modified FDA-approved test that has been validated and its performance characteristics determined by the reporting laboratory.  This laboratory is certified under the Clinical Laboratory Improvement Amendments CLIA as qualified to perform high complexity clinical laboratory testing.    CIPROFLOXACIN  Value in next row Sensitive      2 SENSITIVEThis is a modified FDA-approved test that has been validated and its performance characteristics determined by the reporting laboratory.  This laboratory is certified under the Clinical Laboratory Improvement Amendments CLIA as qualified to perform high complexity clinical laboratory testing.    GENTAMICIN Value in next row Sensitive      2 SENSITIVEThis is a modified FDA-approved test that has been validated and its performance characteristics determined by the reporting laboratory.  This laboratory is certified under the Clinical Laboratory Improvement Amendments CLIA as qualified to perform high complexity clinical laboratory testing.    NITROFURANTOIN  Value in next row Sensitive      2 SENSITIVEThis is a modified FDA-approved test that has been validated and its performance characteristics determined by the reporting laboratory.  This laboratory is certified under the Clinical Laboratory Improvement Amendments CLIA as qualified to perform high complexity clinical laboratory testing.    TRIMETH /SULFA  Value in next row Sensitive      2 SENSITIVEThis is a modified FDA-approved test that has been validated and its performance characteristics determined by the reporting laboratory.   This laboratory is certified under the Clinical Laboratory Improvement Amendments CLIA as qualified to perform high complexity clinical laboratory testing.    AMPICILLIN/SULBACTAM Value in next row Sensitive      2 SENSITIVEThis is a modified FDA-approved test that has been validated and its performance characteristics determined by the reporting laboratory.  This laboratory is certified under the Clinical Laboratory Improvement Amendments CLIA as qualified to perform high complexity clinical laboratory testing.    PIP/TAZO Value in next  row Sensitive      <=4 SENSITIVEThis is a modified FDA-approved test that has been validated and its performance characteristics determined by the reporting laboratory.  This laboratory is certified under the Clinical Laboratory Improvement Amendments CLIA as qualified to perform high complexity clinical laboratory testing.    MEROPENEM Value in next row Sensitive      <=4 SENSITIVEThis is a modified FDA-approved test that has been validated and its performance characteristics determined by the reporting laboratory.  This laboratory is certified under the Clinical Laboratory Improvement Amendments CLIA as qualified to perform high complexity clinical laboratory testing.    * >=100,000 COLONIES/mL ESCHERICHIA COLI  Expectorated Sputum Assessment w Gram Stain, Rflx to Resp Cult     Status: None   Collection Time: 05/28/24  1:18 PM   Specimen: Salivary Gland; Sputum  Result Value Ref Range Status   Specimen Description SALIVA  Final   Special Requests EXPSU  Final   Sputum evaluation   Final    THIS SPECIMEN IS ACCEPTABLE FOR SPUTUM CULTURE Performed at [facility], [address] [place], [place] [address]    Report Status 05/29/2024 FINAL  Final  Culture, Respiratory w Gram Stain     Status: None   Collection Time: 05/28/24  1:18 PM  Result Value Ref Range Status   Specimen Description EXPECTORATED SPUTUM  Final   Special Requests Reflexed from  [id number]  Final   Gram Stain   Final    FEW WBC PRESENT, PREDOMINANTLY PMN RARE GRAM NEGATIVE RODS Performed at [facility], [address] [place], [place] [address]    Culture RARE CANDIDA ALBICANS  Final   Report Status 06/01/2024 FINAL  Final  MRSA Next Gen by PCR, Nasal     Status: None   Collection Time: 05/28/24  9:40 PM   Specimen: Nasal Mucosa; Nasal Swab  Result Value Ref Range Status   MRSA by PCR Next Gen NOT DETECTED NOT DETECTED Final    Comment: (NOTE) The GeneXpert MRSA Assay (FDA approved for NASAL specimens only), is one component of a comprehensive MRSA colonization surveillance program. It is not intended to diagnose MRSA infection nor to guide or monitor treatment for MRSA infections. Test performance is not FDA approved in patients less than 22 years old. Performed at [facility], [address] [place], [place] [address]   Blood culture (routine x 2)     Status: None   Collection Time: 05/28/24 10:24 PM   Specimen: BLOOD  Result Value Ref Range Status   Specimen Description BLOOD BLOOD RIGHT HAND  Final   Special Requests   Final    BOTTLES DRAWN AEROBIC ONLY Blood Culture results may not be optimal due to an inadequate volume of blood received in culture bottles   Culture   Final    NO GROWTH 5 DAYS Performed at [facility], [address], [place], [place] [address]    Report Status 06/02/2024 FINAL  Final  Body fluid culture w Gram Stain     Status: None (Preliminary result)   Collection Time: 05/29/24  8:00 AM   Specimen: Other Source; Body Fluid  Result Value Ref Range Status   Specimen Description   Final    OTHER Performed at [facility], [address] [place], [place] [address]    Special Requests   Final    NONE Performed at [facility], [address] [place], [place] [address]    Gram Stain  Final    FEW WBC PRESENT, PREDOMINANTLY PMN MODERATE GRAM POSITIVE COCCI FEW GRAM NEGATIVE  RODS Performed at [facility], [address] [place], [place] [address]    Culture   Final    CULTURE REINCUBATED FOR BETTER GROWTH ABUNDANT ESCHERICHIA COLI ABUNDANT CITROBACTER KOSERI FEW PROTEUS MIRABILIS    Report Status PENDING  Incomplete   Organism ID, Bacteria ESCHERICHIA COLI  Final   Organism ID, Bacteria CITROBACTER KOSERI  Final   Organism ID, Bacteria PROTEUS MIRABILIS  Final      Susceptibility   Citrobacter koseri - MIC*    CEFEPIME  <=0.12 SENSITIVE Sensitive     ERTAPENEM <=0.12 SENSITIVE Sensitive     CEFTRIAXONE  <=0.25 SENSITIVE Sensitive     CIPROFLOXACIN  <=0.06 SENSITIVE Sensitive     GENTAMICIN <=1 SENSITIVE Sensitive     MEROPENEM <=0.25 SENSITIVE Sensitive     TRIMETH /SULFA  <=20 SENSITIVE Sensitive     PIP/TAZO Value in next row Sensitive      <=4 SENSITIVEThis is a modified FDA-approved test that has been validated and its performance characteristics determined by the reporting laboratory.  This laboratory is certified under the Clinical Laboratory Improvement Amendments CLIA as qualified to perform high complexity clinical laboratory testing.    * ABUNDANT CITROBACTER KOSERI   Escherichia coli - MIC*    AMPICILLIN Value in next row Sensitive      <=4 SENSITIVEThis is a modified FDA-approved test that has been validated and its performance characteristics determined by the reporting laboratory.  This laboratory is certified under the Clinical Laboratory Improvement Amendments CLIA as qualified to perform high complexity clinical laboratory testing.    CEFAZOLIN  (NON-URINE) Value in next row Sensitive      <=4 SENSITIVEThis is a modified FDA-approved test that has been validated and its performance characteristics determined by the reporting laboratory.  This laboratory is certified under the Clinical Laboratory Improvement Amendments CLIA as qualified to perform high complexity clinical laboratory testing.    CEFEPIME  Value in next row Sensitive       <=4 SENSITIVEThis is a modified FDA-approved test that has been validated and its performance characteristics determined by the reporting laboratory.  This laboratory is certified under the Clinical Laboratory Improvement Amendments CLIA as qualified to perform high complexity clinical laboratory testing.    ERTAPENEM Value in next row Sensitive      <=4 SENSITIVEThis is a modified FDA-approved test that has been validated and its performance characteristics determined by the reporting laboratory.  This laboratory is certified under the Clinical Laboratory Improvement Amendments CLIA as qualified to perform high complexity clinical laboratory testing.    CEFTRIAXONE  Value in next row Sensitive      <=4 SENSITIVEThis is a modified FDA-approved test that has been validated and its performance characteristics determined by the reporting laboratory.  This laboratory is certified under the Clinical Laboratory Improvement Amendments CLIA as qualified to perform high complexity clinical laboratory testing.    CIPROFLOXACIN  Value in next row Sensitive      <=4 SENSITIVEThis is a modified FDA-approved test that has been validated and its performance characteristics determined by the reporting laboratory.  This laboratory is certified under the Clinical Laboratory Improvement Amendments CLIA as qualified to perform high complexity clinical laboratory testing.    GENTAMICIN Value in next row Sensitive      <=4 SENSITIVEThis is a modified FDA-approved test that has been validated and its performance characteristics determined by the reporting laboratory.  This laboratory is certified under the Clinical  Laboratory Improvement Amendments CLIA as qualified to perform high complexity clinical laboratory testing.    MEROPENEM Value in next row Sensitive      <=4 SENSITIVEThis is a modified FDA-approved test that has been validated and its performance characteristics determined by the reporting laboratory.  This laboratory  is certified under the Clinical Laboratory Improvement Amendments CLIA as qualified to perform high complexity clinical laboratory testing.    TRIMETH /SULFA  Value in next row Sensitive      <=4 SENSITIVEThis is a modified FDA-approved test that has been validated and its performance characteristics determined by the reporting laboratory.  This laboratory is certified under the Clinical Laboratory Improvement Amendments CLIA as qualified to perform high complexity clinical laboratory testing.    AMPICILLIN/SULBACTAM Value in next row Sensitive      <=4 SENSITIVEThis is a modified FDA-approved test that has been validated and its performance characteristics determined by the reporting laboratory.  This laboratory is certified under the Clinical Laboratory Improvement Amendments CLIA as qualified to perform high complexity clinical laboratory testing.    PIP/TAZO Value in next row Sensitive      <=4 SENSITIVEThis is a modified FDA-approved test that has been validated and its performance characteristics determined by the reporting laboratory.  This laboratory is certified under the Clinical Laboratory Improvement Amendments CLIA as qualified to perform high complexity clinical laboratory testing.    * ABUNDANT ESCHERICHIA COLI   Proteus mirabilis - MIC*    AMPICILLIN Value in next row Sensitive      <=4 SENSITIVEThis is a modified FDA-approved test that has been validated and its performance characteristics determined by the reporting laboratory.  This laboratory is certified under the Clinical Laboratory Improvement Amendments CLIA as qualified to perform high complexity clinical laboratory testing.    CEFAZOLIN  (NON-URINE) Value in next row Intermediate      <=4 SENSITIVEThis is a modified FDA-approved test that has been validated and its performance characteristics determined by the reporting laboratory.  This laboratory is certified under the Clinical Laboratory Improvement Amendments CLIA as qualified  to perform high complexity clinical laboratory testing.    CEFEPIME  Value in next row Sensitive      <=4 SENSITIVEThis is a modified FDA-approved test that has been validated and its performance characteristics determined by the reporting laboratory.  This laboratory is certified under the Clinical Laboratory Improvement Amendments CLIA as qualified to perform high complexity clinical laboratory testing.    ERTAPENEM Value in next row Sensitive      <=4 SENSITIVEThis is a modified FDA-approved test that has been validated and its performance characteristics determined by the reporting laboratory.  This laboratory is certified under the Clinical Laboratory Improvement Amendments CLIA as qualified to perform high complexity clinical laboratory testing.    CEFTRIAXONE  Value in next row Sensitive      <=4 SENSITIVEThis is a modified FDA-approved test that has been validated and its performance characteristics determined by the reporting laboratory.  This laboratory is certified under the Clinical Laboratory Improvement Amendments CLIA as qualified to perform high complexity clinical laboratory testing.    CIPROFLOXACIN  Value in next row Sensitive      <=4 SENSITIVEThis is a modified FDA-approved test that has been validated and its performance characteristics determined by the reporting laboratory.  This laboratory is certified under the Clinical Laboratory Improvement Amendments CLIA as qualified to perform high complexity clinical laboratory testing.    GENTAMICIN Value in next row Sensitive      <=4 SENSITIVEThis is a modified FDA-approved test  that has been validated and its performance characteristics determined by the reporting laboratory.  This laboratory is certified under the Clinical Laboratory Improvement Amendments CLIA as qualified to perform high complexity clinical laboratory testing.    MEROPENEM Value in next row Sensitive      <=4 SENSITIVEThis is a modified FDA-approved test that has been  validated and its performance characteristics determined by the reporting laboratory.  This laboratory is certified under the Clinical Laboratory Improvement Amendments CLIA as qualified to perform high complexity clinical laboratory testing.    TRIMETH /SULFA  Value in next row Sensitive      <=4 SENSITIVEThis is a modified FDA-approved test that has been validated and its performance characteristics determined by the reporting laboratory.  This laboratory is certified under the Clinical Laboratory Improvement Amendments CLIA as qualified to perform high complexity clinical laboratory testing.    AMPICILLIN/SULBACTAM Value in next row Sensitive      <=4 SENSITIVEThis is a modified FDA-approved test that has been validated and its performance characteristics determined by the reporting laboratory.  This laboratory is certified under the Clinical Laboratory Improvement Amendments CLIA as qualified to perform high complexity clinical laboratory testing.    PIP/TAZO Value in next row Sensitive      <=4 SENSITIVEThis is a modified FDA-approved test that has been validated and its performance characteristics determined by the reporting laboratory.  This laboratory is certified under the Clinical Laboratory Improvement Amendments CLIA as qualified to perform high complexity clinical laboratory testing.    * FEW PROTEUS MIRABILIS    Coagulation Studies: No results for input(s): LABPROT, INR in the last 72 hours.  Urinalysis: No results for input(s): COLORURINE, LABSPEC, PHURINE, GLUCOSEU, HGBUR, BILIRUBINUR, KETONESUR, PROTEINUR, UROBILINOGEN, NITRITE, LEUKOCYTESUR in the last 72 hours.  Invalid input(s): APPERANCEUR     Imaging: CT ABDOMEN PELVIS WO CONTRAST Addendum Date: 06/01/2024 ADDENDUM REPORT: 06/01/2024 13:08 ADDENDUM: Not mentioned above is mild Pericystic edema which may represent residual cystitis. Electronically Signed   By: Rockey Kilts M.D.   On: 06/01/2024  13:08   Result Date: 06/01/2024 CLINICAL DATA:  Sepsis.  Evaluate position of nephrostomy tube. EXAM: CT ABDOMEN AND PELVIS WITHOUT CONTRAST TECHNIQUE: Multidetector CT imaging of the abdomen and pelvis was performed following the standard protocol without IV contrast. RADIATION DOSE REDUCTION: This exam was performed according to the departmental dose-optimization program which includes automated exposure control, adjustment of the mA and/or kV according to patient size and/or use of iterative reconstruction technique. COMPARISON:  11/27/2023 FINDINGS: Lower chest: Left base dependent airspace disease is similar. New or significantly progressive right base airspace and ground-glass opacity. 2 mm right lower lobe pulmonary nodule can be presumed benign and do/does not warrant imaging follow-up per Fleischner criteria. Moderate cardiomegaly. Similar small left pleural effusion and pleural thickening. New trace right pleural fluid. Small pericardial effusion is unchanged. Dialysis catheter tip mid to low right atrium. Moderately dilated distal esophagus which is incompletely imaged but fluid-filled. Hepatobiliary: Dependent gallstones. Normal noncontrast appearance of the liver. No biliary duct dilatation or evidence of acute cholecystitis. Pancreas: Normal, without mass or ductal dilatation. Spleen: Normal in size, without focal abnormality. Adrenals/Urinary Tract: Normal adrenal glands. Absent left kidney. Moderate right renal atrophy. Right nephrostomy catheter is similar in position, without hydronephrosis or pericatheter fluid collection. Mild distal right hydroureter is similar and likely physiologic. Mild pericystic edema is improved. Similar 1.2 cm focus of left bladder base heterogeneous increased density including on image 66/2. Stomach/Bowel: Normal stomach, without wall thickening. Fluid-filled colon. Normal terminal  ileum and appendix. Multifactorial degradation, including EKG wires and leads, arm  position, lack of oral or IV contrast. Normal small bowel. Vascular/Lymphatic: Aortic atherosclerosis. No abdominopelvic adenopathy. Reproductive: Normal prostate. Other: No significant free fluid.  No free intraperitoneal air. Musculoskeletal: Probable renal osteodystrophy. Mild convex left lumbar spine curvature. IMPRESSION: Multifactorial degradation, as detailed above. Appropriate position of right-sided nephrostomy catheter, within a moderately atrophic right kidney. Absent left kidney. Heterogeneous hyperattenuating left-sided bladder lesion is suspicious for urothelial carcinoma. Recommend cystoscopy if not already performed. New or increased right lower lobe airspace disease, suspicious for aspiration or infection. Dilated lower esophagus is fluid-filled, suggesting dysmotility and/or gastroesophageal reflux. This would predispose the patient to aspiration. Cholelithiasis. Fluid-filled colon, suggesting a diarrheal state. Aortic Atherosclerosis (ICD10-I70.0). Electronically Signed: By: Rockey Kilts M.D. On: 06/01/2024 12:56   DG Chest Port 1 View Result Date: 06/01/2024 CLINICAL DATA:  Right-sided rib pain. EXAM: PORTABLE CHEST 1 VIEW COMPARISON:  05/28/2024 FINDINGS: The cardio pericardial silhouette is enlarged. Similar left base collapse/consolidation with small left effusion. Similar patchy atelectasis or infiltrate at the right base with possible tiny right effusion. No overt pulmonary edema. Left IJ central line tip overlies the right atrium. Vascular stent device noted in each axillary region. Telemetry leads overlie the chest. IMPRESSION: 1. Similar left base collapse/consolidation with small left effusion. 2. Similar patchy atelectasis or infiltrate at the right base with possible tiny right effusion. Electronically Signed   By: Camellia Candle M.D.   On: 06/01/2024 09:29   DG Abd 1 View Result Date: 06/01/2024 CLINICAL DATA:  Right sided rib and abdominal pain. EXAM: ABDOMEN - 1 VIEW  COMPARISON:  04/16/2021 FINDINGS: Mild convex leftward lumbar scoliosis evident. Right percutaneous pigtail drain overlies the central right upper quadrant of the abdomen. Diffuse gas-filled bowel loops evident in a nonobstructive pattern. Gas is seen scattered along the length of the colon from the cecal tip down to the rectum. IMPRESSION: Diffuse gas-filled bowel loops in a nonobstructive pattern. Right percutaneous nephrostomy tube evident. Electronically Signed   By: Camellia Candle M.D.   On: 06/01/2024 09:28      Medications:    sodium chloride  10 mL/hr at 06/02/24 1158   cefTRIAXone  (ROCEPHIN )  IV 1 g (06/02/24 1009)   metronidazole  500 mg (06/02/24 0556)    aspirin  EC  81 mg Oral Daily   atorvastatin   40 mg Oral Daily   calcium  acetate  1,334 mg Oral TID WC   Chlorhexidine  Gluconate Cloth  6 each Topical Q0600   epoetin  alfa-epbx (RETACRIT ) injection  10,000 Units Intravenous Q M,W,F-1800   feeding supplement (NEPRO CARB STEADY)  237 mL Oral TID BM   heparin   5,000 Units Subcutaneous Q8H   lactulose   30 g Oral Once   levothyroxine   50 mcg Oral Daily   lidocaine -prilocaine   1 Application Topical Once per day on Monday Wednesday Friday   midodrine   10 mg Oral TID WC   multivitamin  1 tablet Oral QHS   mouth rinse  15 mL Mouth Rinse 4 times per day   pantoprazole   40 mg Oral Daily   sodium chloride  flush  3 mL Intravenous Q12H   acetaminophen  **OR** acetaminophen , diphenhydrAMINE , famotidine , heparin , HYDROmorphone  (DILAUDID ) injection, ipratropium-albuterol , lidocaine -prilocaine , methylPREDNISolone  (SOLU-MEDROL ) injection, midazolam , mouth rinse, mouth rinse, oxyCODONE , pentafluoroprop-tetrafluoroeth  Assessment/ Plan:  [name] is a 54 y.o.  male with past medical conditions including tobacco abuse, anemia, hypertension, TBI, malnutrition, combined heart failure, cocaine abuse, bilateral nephrostomy tubes and end-stage renal disease on  hemodialysis.  Patient presents to the  emergency department for shortness of breath and has been admitted for Shortness of breath [R06.02] General weakness [R53.1] ESRD on dialysis (HCC) [N18.6, Z99.2] Nephrostomy complication [N99.528] Sepsis (HCC) [A41.9] Elevated procalcitonin [R79.89]    End-stage renal disease on hemodialysis. Due to patient stability and unit volume, we will complete treatment on Tuesday morning.   2.  Sepsis with E. Coli and Citrobacter species growing from urine.  With hypotension requiring vasopressors.  IR planning nephrostomy exchange this admission - ceftriaxone  and metronidazole  - weaned off pressor  3. Anemia of chronic kidney disease Lab Results  Component Value Date   HGB 8.1 (L) 06/02/2024    ESA with HD treatments  4. Secondary Hyperparathyroidism: with outpatient labs: PTH 438, phosphorus 5.9, calcium  8.3 on 04/28/24.   Lab Results  Component Value Date   CALCIUM  9.2 06/02/2024   CAION 0.79 (LL) 09/23/2021   PHOS 5.3 (H) 06/01/2024    Patient prescribed calcitriol  and calcium  acetate outpatient. Bone minerals stable  5.  Hypotensive with history of hypertension with chronic kidney disease.  Home regimen includes carvedilol  and irbesartan  being held - weaned off vasopressor: norepinephrine .  - Continue midodrine .    LOS: 5 Frankie Zito 1/12/202612:39 PM   "

## 2024-06-02 NOTE — Progress Notes (Signed)
 Report called and patient being transferred via bed to room 221 by Charge RN and Nurse tech. Patients bath items and personal belongings transferred with patient in the bed. Patient preferred to keep cell phone in his hands during transport.

## 2024-06-02 NOTE — Progress Notes (Signed)
 SLP Cancellation Note  Patient Details Name: Cyprian Gongaware MRN: [id number] DOB: 1970/11/11   Cancelled treatment:       Reason Eval/Treat Not Completed: Other (comment) (Pt NPO for procedure- SLP will follow up for swallow assessment as medically appropriate and pt is available.)  Izayiah Tibbitts Clapp, MS, CCC-SLP Speech Language Pathologist Rehab Services; [facility] [phone] (ascom)   Shamra Bradeen J Clapp 06/02/2024, 8:18 AM

## 2024-06-02 NOTE — Procedures (Signed)
 Interventional Radiology Procedure Note  Procedure: Fluoro rt pcn exchg    Complications: None  Estimated Blood Loss:  0  Findings: 10 fr Full report in pacs     EMERSON FREDERIC SPECKING, MD

## 2024-06-02 NOTE — Plan of Care (Signed)

## 2024-06-02 NOTE — Progress Notes (Signed)
 "  NAME:  Larry Morrison, MRN:  [id number], DOB:  1970-06-01, LOS: 5 ADMISSION DATE:  05/28/2024  CONSULTATION DATE:  05/29/2024 REFERRING MD:  Dr. 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  He recently presented to [facility] ED on 12/30 due to weakness, nausea and vomiting. He improved after arrival and was discharged back to the SNF he resides at. There was suspicion of mild acute pancreatitis given mild lipase elevation but no specific etiology was identified.   History gathered via chart review and evaluation at bedside He presented from his nursing home via EMS to [facility] ED on 05/28/24 due to tachypnea and of shortness of breath. The patient endorsed his shortness of breath had been present for approximately 24 hours, along with a worsening, wet cough. At the time of evaluation in the ED, he denied fevers and abdominal pain. His last dialysis session was on Monday and he was due for dialysis per his normal dialysis schedule on arrival. It was noted that his right nephrostomy tube output was a tan/yellow color, different from his baseline output appearance.   ED Course   Vitals on Arrival: Temp 98.76F, BP 11/69, HR 113, RR 22, SpO2 95%   Pertinent Labs/Diagnostics Chemistry:  Na+:146, K+: 4.5, BUN/Cr.: 51/11.3, Serum CO2/ AG: 27/19, AST/ALT: 44/53 Hematology:  WBC: 16.3, Hgb: 9.7, Platelets 229 BNP: pending Lactic/ PCT: 1.9/55.3 COVID-19 & Influenza A/B: negative MRSA PCR: negative Blood cultures x 2 drawn Respiratory Viral Panel (20 pathogen): negative UA: turbid appearance, small Hgb and moderate leukocytes Urine culture ordered CXR 05/28/24: chronic  pleural effusions; stable on the left and regressed on the right without any acute cardiopulmonary process.   Medications Administered Ceftriaxone  x 1 Vancomycin    The patient was evaluated by the hospitalist service for admission and nephrology was consulted with plans for dialysis.    Overnight the patient was noted to have persistent hypotension. His MAPs were in the mid 50's to mid 60's. He was administered 5mg  of midodrine  with very minimal improvement in blood pressure. Given his heart failure history, higher doses were not attempted.    PCCM consulted for admission due to hypotension and was started on a levophed  gtt.     Pertinent Medical History  Hypertension Pulmonary Hypertension HFrEF Cardiomyopathy ESRD on hemodialysis History of left Nephrectomy Right sided Nephrostomy Stroke Tobacco abuse Protein Calorie Malnutrition Polysubstance abuse Neurogenic Bladder with chronic indwelling foley catheter Anemia of Chronic Disease   Significant Hospital Events: Including procedures, antibiotic start and stop dates in addition to other pertinent events   05/28/24: Presented to [facility] ED with concern for shortness of breath. Was not found to be overly hypoxic; stable on 4L nasal cannula. Admitted for sepsis workup with possible source being right nephrostomy tube. Nephrology consulted with plan for hemodialysis. 05/29/24: Did not complete dialysis d/t progressively worsening hypotension requiring PCCM consult for levophed  gtt. SpO2 stable on 4L nasal cannula. Started on Zosyn  and Vanc.  05/30/24: No acute issues overnight 05/31/24: Agitated and refusing care.  Complaining of pain 06/01/24: Complaining of right rib cage pain.  Requested multiple doses of pain medications.  Off pressors 1/12 transferred to GEN MED FLOOR   Interim  History / Subjective:  BP 103/61   Pulse (!) 0   Temp 98.4 F (36.9 C) (Oral)   Resp (!) 22   Ht 5' 4 (1.626 m)   Wt 48.8 kg   SpO2 93%   BMI 18.47 kg/m    He is on vancomycin  and ceftriaxone  for E. coli bacteremia.  Right nephrostomy tube in place and draining purulent drainage.  He is refusing medications as well as meals.  ICU needs resolved   Physical Exam  ill-appearing and cachectic S1s2 No wheezing No focal deficits  ASSESSMENT / PLAN:   Acute hypoxic respiratory failure-resolved Chronic pleural effusions Right lung infiltrate and left lung consolidation and effusion-there is concern for aspiration pneumonia -Continue supplemental oxygen to maintain SpO2 greater than 90% - DuoNebs every 4 hours as needed - ABG and chest x-ray as needed -Monitor respiratory status closely -PRN diuretics for pleural effusions once BP is stable -Continue ceftriaxone  and add Flagyl  for anaerobic coverage  CARDIOVASCULAR A:  #Septic Shock s/t suspected source: nephrostomy tube vs UTI vs dialysis catheter #HFrEF (LVEF <20%) - Systolic and Diastolic Heart Failure Hx: Hypertension, Hyperlipidemia RESOLVED  HD as needed  INFECTIOUS Sepsis due to E. coli bacteremia- - Trend WBC and monitor fever curve - Blood cultures positive for E. coli - RVP 20 pathogen: negative - Flu/Covid/RSV: negative - Urine culture positive for E. coli  - ABX: Changed to ceftriaxone  and Flagyl  to cover for aspiration pneumonitis - Discontinue vancomycin  - Monitor and repeat cultures if needed  Disposition and Family update: Patient is a ward of the state and guardian is unavailable during the weekend.  HIPAA appropriate message left.  Tried to discuss patient's plan of care with him and he was dismissive and did not want to participate.   Larry Morrison, M.D.  [facility]  Medical Director [facility]    "

## 2024-06-03 ENCOUNTER — Inpatient Hospital Stay

## 2024-06-03 ENCOUNTER — Encounter: Payer: Self-pay | Admitting: Student

## 2024-06-03 DIAGNOSIS — N186 End stage renal disease: Secondary | ICD-10-CM

## 2024-06-03 DIAGNOSIS — Z992 Dependence on renal dialysis: Secondary | ICD-10-CM

## 2024-06-03 DIAGNOSIS — N99528 Other complication of other external stoma of urinary tract: Secondary | ICD-10-CM | POA: Diagnosis not present

## 2024-06-03 DIAGNOSIS — K920 Hematemesis: Secondary | ICD-10-CM | POA: Diagnosis not present

## 2024-06-03 DIAGNOSIS — I5022 Chronic systolic (congestive) heart failure: Secondary | ICD-10-CM

## 2024-06-03 DIAGNOSIS — B964 Proteus (mirabilis) (morganii) as the cause of diseases classified elsewhere: Secondary | ICD-10-CM

## 2024-06-03 DIAGNOSIS — R112 Nausea with vomiting, unspecified: Secondary | ICD-10-CM

## 2024-06-03 DIAGNOSIS — B9620 Unspecified Escherichia coli [E. coli] as the cause of diseases classified elsewhere: Secondary | ICD-10-CM | POA: Diagnosis not present

## 2024-06-03 DIAGNOSIS — B9689 Other specified bacterial agents as the cause of diseases classified elsewhere: Secondary | ICD-10-CM

## 2024-06-03 DIAGNOSIS — I4891 Unspecified atrial fibrillation: Secondary | ICD-10-CM | POA: Diagnosis not present

## 2024-06-03 DIAGNOSIS — R531 Weakness: Secondary | ICD-10-CM

## 2024-06-03 DIAGNOSIS — N390 Urinary tract infection, site not specified: Secondary | ICD-10-CM | POA: Diagnosis not present

## 2024-06-03 DIAGNOSIS — I959 Hypotension, unspecified: Secondary | ICD-10-CM

## 2024-06-03 DIAGNOSIS — D631 Anemia in chronic kidney disease: Secondary | ICD-10-CM

## 2024-06-03 DIAGNOSIS — I5020 Unspecified systolic (congestive) heart failure: Secondary | ICD-10-CM

## 2024-06-03 DIAGNOSIS — Z7189 Other specified counseling: Secondary | ICD-10-CM

## 2024-06-03 LAB — LACTIC ACID, PLASMA
Lactic Acid, Venous: 2 mmol/L (ref 0.5–1.9)
Lactic Acid, Venous: 0.9 mmol/L (ref 0.5–1.9)

## 2024-06-03 LAB — CBC
HCT: 25.6 % — ABNORMAL LOW (ref 39.0–52.0)
Hemoglobin: 7.9 g/dL — ABNORMAL LOW (ref 13.0–17.0)
MCH: 32.9 pg (ref 26.0–34.0)
MCHC: 30.9 g/dL (ref 30.0–36.0)
MCV: 106.7 fL — ABNORMAL HIGH (ref 80.0–100.0)
Platelets: 261 K/uL (ref 150–400)
RBC: 2.4 MIL/uL — ABNORMAL LOW (ref 4.22–5.81)
RDW: 14.3 % (ref 11.5–15.5)
WBC: 10.7 K/uL — ABNORMAL HIGH (ref 4.0–10.5)
nRBC: 0.6 % — ABNORMAL HIGH (ref 0.0–0.2)

## 2024-06-03 LAB — COMPREHENSIVE METABOLIC PANEL WITH GFR
ALT: 24 U/L (ref 0–44)
AST: 24 U/L (ref 15–41)
Albumin: 3 g/dL — ABNORMAL LOW (ref 3.5–5.0)
Alkaline Phosphatase: 67 U/L (ref 38–126)
Anion gap: 27 — ABNORMAL HIGH (ref 5–15)
BUN: 59 mg/dL — ABNORMAL HIGH (ref 6–20)
CO2: 20 mmol/L — ABNORMAL LOW (ref 22–32)
Calcium: 9.3 mg/dL (ref 8.9–10.3)
Chloride: 94 mmol/L — ABNORMAL LOW (ref 98–111)
Creatinine, Ser: 13.9 mg/dL — ABNORMAL HIGH (ref 0.61–1.24)
GFR, Estimated: 4 mL/min — ABNORMAL LOW
Glucose, Bld: 80 mg/dL (ref 70–99)
Potassium: 3.5 mmol/L (ref 3.5–5.1)
Sodium: 141 mmol/L (ref 135–145)
Total Bilirubin: 0.4 mg/dL (ref 0.0–1.2)
Total Protein: 7.9 g/dL (ref 6.5–8.1)

## 2024-06-03 LAB — BODY FLUID CULTURE W GRAM STAIN

## 2024-06-03 LAB — HEMOGLOBIN AND HEMATOCRIT, BLOOD
HCT: 27.2 % — ABNORMAL LOW (ref 39.0–52.0)
Hemoglobin: 8.5 g/dL — ABNORMAL LOW (ref 13.0–17.0)

## 2024-06-03 LAB — PHOSPHORUS: Phosphorus: 6.5 mg/dL — ABNORMAL HIGH (ref 2.5–4.6)

## 2024-06-03 LAB — MAGNESIUM: Magnesium: 2.4 mg/dL (ref 1.7–2.4)

## 2024-06-03 LAB — GLUCOSE, CAPILLARY: Glucose-Capillary: 87 mg/dL (ref 70–99)

## 2024-06-03 MED ORDER — NOREPINEPHRINE 4 MG/250ML-% IV SOLN
0.0000 ug/min | INTRAVENOUS | Status: DC
  Filled 2024-06-03: qty 250

## 2024-06-03 MED ORDER — HEPARIN SODIUM (PORCINE) 1000 UNIT/ML IJ SOLN
INTRAMUSCULAR | Status: AC
  Filled 2024-06-03: qty 5

## 2024-06-03 MED ORDER — AMIODARONE HCL IN DEXTROSE 360-4.14 MG/200ML-% IV SOLN
60.0000 mg/h | INTRAVENOUS | Status: AC
  Administered 2024-06-03: 60 mg/h via INTRAVENOUS
  Filled 2024-06-03: qty 200

## 2024-06-03 MED ORDER — PANTOPRAZOLE SODIUM 40 MG IV SOLR
80.0000 mg | Freq: Once | INTRAVENOUS | Status: AC
  Administered 2024-06-03: 80 mg via INTRAVENOUS
  Filled 2024-06-03: qty 20

## 2024-06-03 MED ORDER — METOPROLOL TARTRATE 5 MG/5ML IV SOLN
2.5000 mg | Freq: Once | INTRAVENOUS | Status: AC
  Administered 2024-06-03: 2.5 mg via INTRAVENOUS
  Filled 2024-06-03: qty 5

## 2024-06-03 MED ORDER — LACTATED RINGERS IV BOLUS
500.0000 mL | Freq: Once | INTRAVENOUS | Status: AC
  Administered 2024-06-03: 500 mL via INTRAVENOUS

## 2024-06-03 MED ORDER — PANTOPRAZOLE SODIUM 40 MG IV SOLR
40.0000 mg | Freq: Two times a day (BID) | INTRAVENOUS | Status: DC
  Administered 2024-06-03 – 2024-06-05 (×3): 40 mg via INTRAVENOUS
  Filled 2024-06-03 (×3): qty 10

## 2024-06-03 MED ORDER — SODIUM CHLORIDE 0.9 % IV SOLN
2.0000 g | INTRAVENOUS | Status: DC
  Administered 2024-06-04 – 2024-06-05 (×2): 2 g via INTRAVENOUS
  Filled 2024-06-03 (×4): qty 20

## 2024-06-03 MED ORDER — SODIUM CHLORIDE 0.9 % IV SOLN: 250.0000 mL | INTRAVENOUS | Status: AC

## 2024-06-03 MED ORDER — AMIODARONE IV BOLUS ONLY 150 MG/100ML
150.0000 mg | Freq: Once | INTRAVENOUS | Status: AC
  Administered 2024-06-03: 150 mg via INTRAVENOUS
  Filled 2024-06-03 (×2): qty 100

## 2024-06-03 MED ORDER — AMIODARONE HCL IN DEXTROSE 360-4.14 MG/200ML-% IV SOLN
30.0000 mg/h | INTRAVENOUS | Status: DC
  Administered 2024-06-03 – 2024-06-04 (×2): 30 mg/h via INTRAVENOUS
  Filled 2024-06-03 (×3): qty 200

## 2024-06-03 NOTE — Progress Notes (Signed)
 Arrived towards the end of rapid response call for Afib RVR, nausea, vomiting and hypotension. On my arrival systolics in the 130s and HR in the 90s. Patient was transferred to 2A with plan to start IV amiodarone  per MD note recommendations.   Lidie Glade [place]ster, PA-C
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
## 2024-06-03 NOTE — Progress Notes (Signed)
 Initial Nutrition Assessment  DOCUMENTATION CODES:   Underweight, Severe malnutrition in context of chronic illness  INTERVENTION:   -RD will follow for diet advancement and add supplements as appropriate -If unable to advance to PO diet, may need to consider alternative means of nutrition/ hydration is this aligns with goals of care -If nutrition is started, recommend:  -Renal MVI daily -100 mg thiamine  daily x 7 days -Monitor Mg, K, and Phos and replete as needed secondary to high refeeding risk   NUTRITION DIAGNOSIS:   Severe Malnutrition related to chronic illness (ESRD on HD) as evidenced by percent weight loss, moderate fat depletion, severe fat depletion, moderate muscle depletion, severe muscle depletion.  GOAL:   Patient will meet greater than or equal to 90% of their needs  MONITOR:   Diet advancement  REASON FOR ASSESSMENT:   Consult Assessment of nutrition requirement/status  ASSESSMENT:   54 y.o. 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  Patient was recently seen in the ED on 12/30 with weakness and nausea and vomiting.  No definitive cause was elucidated and patient improved after arrival and he was discharged back to the nursing facility.  It was felt he may have had mild acute pancreatitis due to the mild elevation in lipase.  He was sent back because of tachypnea and reports of shortness of breath.  Patient admitted with sepsis of uncertain etiology (infected urostomy tubes vs infected HD catheter).   1/12- right PCN exchange   Reviewed I/O's: +120 ml x 24 hours and +2.9 L since admission  UOP: 0 ml x 24 hours  Per GI notes, plan for EGD tomorrow. GI reports that patient may have upper GI bleeding from possible  peptic ulcer disease versus, gastritis versus, esophagitis versus AVMs.   Per nursing notes, patient has been refusing food and medications. He has had 2 episodes of coffee ground emesis today. Per MD, will place NGT if further vomiting persists. Noted meal completions 0-20%.   Visited patient is HD suite. Patient is familiar to this RD due to multiple prior admissions. Patient initially eager to engage RD in conversation, but quickly becomes irritable, stating why are you touching me? and why are you asking me all these questions?. RD explained role and rationale for visit and interventions multiple times, however, patient continued to be irritable. RD minimized questioning and tasks in attempt to deescalate situation.   He reports that he was eating and drinking well PTA, stating he grazes throughout the day. He is unable to provide a diet recall. Chart reports that he was ina SNF PTA, however, patient shares that he was living at home with caregivers.   He denies any weight loss. Reviewed weight history; patient has experienced a 13.4% weight loss over the past 2 months, which is significant for time frame.   Per HD coordinator, patient received outpatient [facility]. RD discussed case and requested dry weight; per HD coordinator, unable to connect with the center. RD asked patient what his dry weight was during visit, which he replied he has no idea. HD coordinator informed. Post HD weight was 49.6 kg on 05/28/24.  Per palliative care and hospitalist, pat reports he desires full code. MD discussed goals of care with DSS guardian; plan to confer code status change to DNR with  DSS. Palliative care following for goals of care discussions. If patient remains NPO and unable to advance diet, may need to consider alternative means of nutrition/ hydration if this aligns with patient's goals of care.   Medications reviewed and include phoslo , lactulose , midodrine ,and protonix .   Labs  reviewed: K and Mg WDL. Phos: 6.5, CBGS: 87 (inpatient orders for glycemic control are none).    NUTRITION - FOCUSED PHYSICAL EXAM:  Flowsheet Row Most Recent Value  Orbital Region Moderate depletion  Upper Arm Region Severe depletion  Thoracic and Lumbar Region Severe depletion  Buccal Region Moderate depletion  Temple Region Severe depletion  Clavicle Bone Region Severe depletion  Clavicle and Acromion Bone Region Severe depletion  Scapular Bone Region Severe depletion  Dorsal Hand Severe depletion  Patellar Region Severe depletion  Anterior Thigh Region Severe depletion  Posterior Calf Region Severe depletion  Edema (RD Assessment) Mild  Hair Reviewed  Eyes Reviewed  Mouth Reviewed  Skin Reviewed    Diet Order:   Diet Order             Diet NPO time specified Except for: Sips with Meds  Diet effective now                   EDUCATION NEEDS:   Not appropriate for education at this time  Skin:  Skin Assessment: Reviewed RN Assessment  Last BM:  06/03/24 (type 6)  Height:   Ht Readings from Last 1 Encounters:  06/02/24 5' 4 (1.626 m)    Weight:   Wt Readings from Last 1 Encounters:  06/03/24 47.1 kg    Ideal Body Weight:  59.1 kg  BMI:  Body mass index is 17.82 kg/m.  Estimated Nutritional Needs:   Kcal:  1700-1900  Protein:  90-105 grams  Fluid:  1000 ml + UOP    Margery ORN, RD, LDN, CDCES Registered Dietitian III Certified Diabetes Care and Education Specialist If unable to reach this RD, please use RD Inpatient group chat on secure chat between hours of 8am-4 pm daily

## 2024-06-03 NOTE — Assessment & Plan Note (Addendum)
 Patient developed nausea and vomiting, concern of coffee-ground emesis.  Gastric studies to rule out any bleeding was ordered but unfortunately lab stopped doing that test.  Hemoglobin currently stable. -Patient was made n.p.o. -KUB ordered -GI was consulted- -started on IV Protonix  -Monitor hemoglobin -Will get NG tube placed if continue to have more vomiting as trying to avoid any antiemetic due to already prolonged QTc and need of continuing amiodarone  for now

## 2024-06-03 NOTE — Progress Notes (Signed)
 Patient transfered to PCU from [facility]. Upon arrival, patient placed on continuous cardiac telemetry monitoring. Vital signs obtained and documented. Patient oriented to room, call bell system, and unit routines. Safety measures implemented: bed in lowest position, call bell in reach, and side rails up x3. Patient repositioned for comfort and skin protection. Initial head-to-toes assessment completed. Patient only has 1 PIV access. Attempted 2nd access but was unsuccessful. IV team consult placed. Patient alert and oriented x 3. Patient's vitals are stable and respirations are normal. Patient resting comfortably in bed, no acute distress noted at this time.

## 2024-06-03 NOTE — Progress Notes (Signed)
 New onset Atrial Fibrillation with Rapid Ventricular Response CHA2DS2-VASc score: 5 2.5 mg metoprolol  administered with correlating hypotension - Amiodarone  bolus ordered, considered ongoing infusion if needed - Unable to anticoagulate at this time due to new onset coffee-ground emesis  - f/u TSH & thyroid  panel, f/u electrolytes-replace PRN - Cardiology consulted, appreciate input - Continuous cardiac monitoring   Acute Gastrointestinal Bleed - Hold aspirin  - 80 mg Protonix  bolus ordered followed by 40 mg Protonix  IV twice daily - Maintain up to date type & screen - continuous cardiac monitoring - NPO - Monitor for s/s of bleeding - Daily CBC, PT/ INR monitoring PRN - Transfuse for Hgb <7 - GI consulted, appreciate input   Larry Morrison, AGACNP-BC Acute Care Nurse Practitioner [facility]   [phone] / [phone] Please see Amion for details.

## 2024-06-03 NOTE — Assessment & Plan Note (Signed)
 Patient is currently hypotensive and home Coreg  and irbesartan  has been held. - Monitor blood pressure

## 2024-06-03 NOTE — Progress Notes (Addendum)
"  ° °      Overnight   NAME: Larry Morrison MRN: [id number] DOB : Jan 26, 1971    Date of Service   06/03/2024   HPI/Events of Note   HPI: 54 year old male with past medical history significant for chronic kidney disease stage V on hemodialysis Monday Wednesday Friday, hypertension, hyperlipidemia, GERD, history of tobacco use, anemia, TBI, protein calorie malnutrition, heart failure reduced ejection fraction with left ventricular ejection fraction less than 20 as of 2022 prior history of polysubstance abuse recently presented to the ED on 12/30 ED due to weakness nausea vomiting he improved after arrival and was discharged back to his SNF where he resides there was suspicion for acute pancreatitis given mild lipase elevation but no specific etiology was identified.  Patient returned to emergency department on 1/7 due to tachypnea and shortness of breath with a worsening wet cough.  Patient last dialysis session was on Monday and he was due for dialysis per his normal dialysis schedule on arrival it was noted that his nephrostomy tube had a tan-yellow color output which is different from his baseline.  Patient has been treated with ceftriaxone  and vancomycin , acute hypoxic respiratory failure, and sepsis due to E. coli bacteremia.   Overnight: RN reports telemetry showing for A-fib.  EKG ordered positive for A-fib RVR.  blood pressure stable metoprolol  ordered.     Interventions/ Plan   EKG-A-fib RVR IV metoprolol  Recent echocardiogram on 1/9 Lab work in process (CBC, mag Phos, BMP)  consulting cardiology in the a.m.    Updates     Yaakov Saindon Donati- Aram BSN RN CCRN AGACNP-BC Acute Care Nurse Practitioner [facility] [place]  "

## 2024-06-03 NOTE — Progress Notes (Signed)
 Rapid response called at aprox 1015.  HR-150 RR 52 Sp02 on 2L/Bowler 100% BP 121/85  pt has a weak congested cough, however, cough is productive.  Pt went unresponsive for a couple min and went into A-fib  no signs of any respiratory distress at the current time

## 2024-06-03 NOTE — Assessment & Plan Note (Signed)
 Hemoglobin currently stable at 8.5. Concern of coffee-ground emesis. - Monitor hemoglobin -Transfuse if below 8

## 2024-06-03 NOTE — Assessment & Plan Note (Signed)
 Patient again became hypotensive earlier, blood pressure now improved after converted back to sinus rhythm. -Continue with midodrine  -Avoid any metoprolol  for rate control -Low threshold to start him on pressors

## 2024-06-03 NOTE — Assessment & Plan Note (Signed)
 Patient initially admitted with concern of septic shock requiring pressors.  Currently on midodrine  with borderline blood pressure.  Urine and body fluid cultures grew multiple organisms which include E. coli, Citrobacter, Proteus and Enterobacter faecalis.  Blood cultures negative. Likely from infected nephrostomy tube.  Nephrostomy tube was exchanged.  There was also concern of aspiration pneumonia.  Patient received broad-spectrum antibiotics initially, later narrowed to ceftriaxone  and Flagyl .  - Continue with ceftriaxone  and Flagyl  -ID was consulted to help with type and duration of antibiotic for this complicated and sick gentleman

## 2024-06-03 NOTE — Progress Notes (Signed)
 Patient off unit to dialysis.

## 2024-06-03 NOTE — Assessment & Plan Note (Signed)
 Patient with advanced heart failure with EF of less than 20%.  Currently appears euvolemic.  Soft blood pressure so GDMT is not possible.  Home Coreg  and irbesartan  has been held. - Cardiology is on board -Volume is being managed with dialysis

## 2024-06-03 NOTE — Progress Notes (Signed)
 Per night report, patient went into a fib rvr and patient was vomiting coffee ground emesis and at 0430 a rapid response call was called. Writer rechecked patients BP 77/46, pulse 112 at 0712. Per order map to stay greater than 55. Patient was initially to be transferred to ICU and started on Levophed . Writer initially help Midodrine  due to Levophed  orders. Orders were changed and patient was to transport to 2A instead. At 1017 writer attempted to give patient Midodrine  and patient began projectile vomiting coffee ground emesis and was briefly unresponsive. Writer called rapid response. Patients BP was 121/85 with heart rate of 151 and was still secreting coffee ground emesis using yaunker. Patient was transferred to 56 with 2A charge nurse, clinical research associate and [place], [employer] [employer]. Hand off report was given to [place], [employer].

## 2024-06-03 NOTE — Progress Notes (Signed)
 Critical lactate called in. 2.0.  MD made aware.
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
## 2024-06-03 NOTE — Plan of Care (Signed)
 PMT consult noted for goals of care.  In to see patient.  He is currently sitting in bed at this time.  He is very difficult to understand with speaking.  He states he does not feel well but is unable to articulate further.  He is able to state that he would like to continue medical treatment and is grateful for the care that he has been provided.  Attempted to call [name] [place] [facility], and a VM was left.  Also attempted to call Z[place] (no last name listed) with DSS unsuccessfully.

## 2024-06-03 NOTE — Progress Notes (Signed)
" °  Chaplain On-Call responded to Rapid Response notification at 1020 hours.  The patient was being attended by the medical team in Room 221 (Unit 2-C). He was transferred to Room 239 (unit 2-A) for additional care.  Chaplain assured Staff of availability as needed.  Chaplain Bebe Ardean EMERSON Hershal., BCC "

## 2024-06-03 NOTE — Assessment & Plan Note (Signed)
 Estimated body mass index is 18.47 kg/m as calculated from the following:   Height as of this encounter: 5' 4 (1.626 m).   Weight as of this encounter: 48.8 kg.    -Dietitian consult

## 2024-06-03 NOTE — Consult Note (Signed)
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      2 SENSITIVEThis is a modified FDA-approved test that has been validated and its performance characteristics determined by the reporting laboratory.  This laboratory is certified under the Clinical Laboratory Improvement Amendments CLIA as qualified to perform high complexity clinical laboratory testing.    ERTAPENEM Value in next row Sensitive      2 SENSITIVEThis is a modified FDA-approved test that has been validated and its performance characteristics determined by the reporting laboratory.  This laboratory is certified under the Clinical Laboratory Improvement Amendments CLIA as qualified to perform high complexity clinical laboratory testing.    CEFTRIAXONE  Value in next row Sensitive      2 SENSITIVEThis is  a modified FDA-approved test that has been validated and its performance characteristics determined by the reporting laboratory.  This laboratory is certified under the Clinical Laboratory Improvement Amendments CLIA as qualified to perform high complexity clinical laboratory testing.    CIPROFLOXACIN  Value in next row Sensitive      2 SENSITIVEThis is a modified FDA-approved test that has been validated and its performance characteristics determined by the reporting laboratory.  This laboratory is certified under the Clinical Laboratory Improvement Amendments CLIA as qualified to perform high complexity clinical laboratory testing.    GENTAMICIN Value in next row Sensitive      2  SENSITIVEThis is a modified FDA-approved test that has been validated and its performance characteristics determined by the reporting laboratory.  This laboratory is certified under the Clinical Laboratory Improvement Amendments CLIA as qualified to perform high complexity clinical laboratory testing.    NITROFURANTOIN  Value in next row Sensitive      2 SENSITIVEThis is a modified FDA-approved test that has been validated and its performance characteristics determined by the reporting laboratory.  This laboratory is certified under the Clinical Laboratory Improvement Amendments CLIA as qualified to perform high complexity clinical laboratory testing.    TRIMETH /SULFA  Value in next row Sensitive      2 SENSITIVEThis is a modified FDA-approved test that has been validated and its performance characteristics determined by the reporting laboratory.  This laboratory is certified under the Clinical Laboratory Improvement Amendments CLIA as qualified to perform high complexity clinical laboratory testing.    AMPICILLIN/SULBACTAM Value in next row Sensitive      2 SENSITIVEThis is a modified FDA-approved test that has been validated and its performance characteristics determined by the reporting laboratory.  This laboratory is certified  under the Clinical Laboratory Improvement Amendments CLIA as qualified to perform high complexity clinical laboratory testing.    PIP/TAZO Value in next row Sensitive      <=4 SENSITIVEThis is a modified FDA-approved test that has been validated and its performance characteristics determined by the reporting laboratory.  This laboratory is certified under the Clinical Laboratory Improvement Amendments CLIA as qualified to perform high complexity clinical laboratory testing.    MEROPENEM Value in next row Sensitive      <=4 SENSITIVEThis is a modified FDA-approved test that has been validated and its performance characteristics determined by the reporting laboratory.  This laboratory is certified under the Clinical Laboratory Improvement Amendments CLIA as qualified to perform high complexity clinical laboratory testing.    * >=100,000 COLONIES/mL ESCHERICHIA COLI  Expectorated Sputum Assessment w Gram Stain, Rflx to Resp Cult     Status: None   Collection Time: 05/28/24  1:18 PM   Specimen: Salivary Gland; Sputum  Result Value Ref Range Status   Specimen Description SALIVA  Final   Special Requests EXPSU  Final   Sputum evaluation   Final    THIS SPECIMEN IS ACCEPTABLE FOR SPUTUM CULTURE Performed at [facility], [address] [place], [place] [address]    Report Status 05/29/2024 FINAL  Final  Culture, Respiratory w Gram Stain     Status: None   Collection Time: 05/28/24  1:18 PM  Result Value Ref Range Status   Specimen Description EXPECTORATED SPUTUM  Final   Special Requests Reflexed from [id number]  Final   Gram Stain   Final    FEW WBC PRESENT, PREDOMINANTLY PMN RARE GRAM NEGATIVE RODS Performed at [facility], [address] [place], [place] [address]    Culture RARE CANDIDA ALBICANS  Final   Report Status 06/01/2024 FINAL  Final  MRSA Next Gen by PCR, Nasal     Status: None   Collection Time: 05/28/24  9:40 PM   Specimen: Nasal Mucosa; Nasal Swab  Result Value  Ref Range Status   MRSA by PCR Next Gen NOT DETECTED NOT DETECTED Final    Comment: (NOTE) The GeneXpert MRSA Assay (FDA approved for NASAL specimens only), is one component of a comprehensive MRSA colonization surveillance program. It is not intended to diagnose MRSA infection nor to guide or monitor treatment for MRSA infections. Test performance is not FDA approved  in patients less than 55 years old. Performed at [facility], [address] [place], [place] [address]   Blood culture (routine x 2)     Status: None   Collection Time: 05/28/24 10:24 PM   Specimen: BLOOD  Result Value Ref Range Status   Specimen Description BLOOD BLOOD RIGHT HAND  Final   Special Requests   Final    BOTTLES DRAWN AEROBIC ONLY Blood Culture results may not be optimal due to an inadequate volume of blood received in culture bottles   Culture   Final    NO GROWTH 5 DAYS Performed at [facility], [address] [place], [place] [address]    Report Status 06/02/2024 FINAL  Final  Body fluid culture w Gram Stain     Status: None   Collection Time: 05/29/24  8:00 AM   Specimen: Other Source; Body Fluid  Result Value Ref Range Status   Specimen Description   Final    OTHER Performed at [facility], [address] [place], [place] [address]    Special Requests   Final    NONE Performed at [facility], [address] [place], [place] [address]    Gram Stain   Final    FEW WBC PRESENT, PREDOMINANTLY PMN MODERATE GRAM POSITIVE COCCI FEW GRAM NEGATIVE RODS Performed at [facility], [address], [place], [place] [address]    Culture   Final    ABUNDANT ENTEROCOCCUS FAECALIS ABUNDANT ESCHERICHIA COLI ABUNDANT CITROBACTER KOSERI FEW PROTEUS MIRABILIS    Report Status 06/03/2024 FINAL  Final   Organism ID, Bacteria ESCHERICHIA COLI  Final   Organism ID, Bacteria CITROBACTER KOSERI  Final   Organism ID, Bacteria PROTEUS MIRABILIS  Final    Organism ID, Bacteria ENTEROCOCCUS FAECALIS  Final      Susceptibility   Citrobacter koseri - MIC*    CEFEPIME  <=0.12 SENSITIVE Sensitive     ERTAPENEM <=0.12 SENSITIVE Sensitive     CEFTRIAXONE  <=0.25 SENSITIVE Sensitive     CIPROFLOXACIN  <=0.06 SENSITIVE Sensitive     GENTAMICIN <=1 SENSITIVE Sensitive     MEROPENEM <=0.25 SENSITIVE Sensitive     TRIMETH /SULFA  <=20 SENSITIVE Sensitive     PIP/TAZO Value in next row Sensitive      <=4 SENSITIVEThis is a modified FDA-approved test that has been validated and its performance characteristics determined by the reporting laboratory.  This laboratory is certified under the Clinical Laboratory Improvement Amendments CLIA as qualified to perform high complexity clinical laboratory testing.    * ABUNDANT CITROBACTER KOSERI   Escherichia coli - MIC*    AMPICILLIN Value in next row Sensitive      <=4 SENSITIVEThis is a modified FDA-approved test that has been validated and its performance characteristics determined by the reporting laboratory.  This laboratory is certified under the Clinical Laboratory Improvement Amendments CLIA as qualified to perform high complexity clinical laboratory testing.    CEFAZOLIN  (NON-URINE) Value in next row Sensitive      <=4 SENSITIVEThis is a modified FDA-approved test that has been validated and its performance characteristics determined by the reporting laboratory.  This laboratory is certified under the Clinical Laboratory Improvement Amendments CLIA as qualified to perform high complexity clinical laboratory testing.    CEFEPIME  Value in next row Sensitive      <=4 SENSITIVEThis is a modified FDA-approved test that has been validated and its performance characteristics determined by the reporting laboratory.  This laboratory is certified under the Clinical  Laboratory Improvement Amendments CLIA as qualified to perform high complexity clinical laboratory testing.    ERTAPENEM Value in next row Sensitive      <=4  SENSITIVEThis is a modified FDA-approved test that has been validated and its performance characteristics determined by the reporting laboratory.  This laboratory is certified under the Clinical Laboratory Improvement Amendments CLIA as qualified to perform high complexity clinical laboratory testing.    CEFTRIAXONE  Value in next row Sensitive      <=4 SENSITIVEThis is a modified FDA-approved test that has been validated and its performance characteristics determined by the reporting laboratory.  This laboratory is certified under the Clinical Laboratory Improvement Amendments CLIA as qualified to perform high complexity clinical laboratory testing.    CIPROFLOXACIN  Value in next row Sensitive      <=4 SENSITIVEThis is a modified FDA-approved test that has been validated and its performance characteristics determined by the reporting laboratory.  This laboratory is certified under the Clinical Laboratory Improvement Amendments CLIA as qualified to perform high complexity clinical laboratory testing.    GENTAMICIN Value in next row Sensitive      <=4 SENSITIVEThis is a modified FDA-approved test that has been validated and its performance characteristics determined by the reporting laboratory.  This laboratory is certified under the Clinical Laboratory Improvement Amendments CLIA as qualified to perform high complexity clinical laboratory testing.    MEROPENEM Value in next row Sensitive      <=4 SENSITIVEThis is a modified FDA-approved test that has been validated and its performance characteristics determined by the reporting laboratory.  This laboratory is certified under the Clinical Laboratory Improvement Amendments CLIA as qualified to perform high complexity clinical laboratory testing.    TRIMETH /SULFA  Value in next row Sensitive      <=4 SENSITIVEThis is a modified FDA-approved test that has been validated and its performance characteristics determined by the reporting laboratory.  This laboratory  is certified under the Clinical Laboratory Improvement Amendments CLIA as qualified to perform high complexity clinical laboratory testing.    AMPICILLIN/SULBACTAM Value in next row Sensitive      <=4 SENSITIVEThis is a modified FDA-approved test that has been validated and its performance characteristics determined by the reporting laboratory.  This laboratory is certified under the Clinical Laboratory Improvement Amendments CLIA as qualified to perform high complexity clinical laboratory testing.    PIP/TAZO Value in next row Sensitive      <=4 SENSITIVEThis is a modified FDA-approved test that has been validated and its performance characteristics determined by the reporting laboratory.  This laboratory is certified under the Clinical Laboratory Improvement Amendments CLIA as qualified to perform high complexity clinical laboratory testing.    * ABUNDANT ESCHERICHIA COLI   Enterococcus faecalis - MIC*    AMPICILLIN Value in next row Sensitive      <=4 SENSITIVEThis is a modified FDA-approved test that has been validated and its performance characteristics determined by the reporting laboratory.  This laboratory is certified under the Clinical Laboratory Improvement Amendments CLIA as qualified to perform high complexity clinical laboratory testing.    VANCOMYCIN  Value in next row Sensitive      <=4 SENSITIVEThis is a modified FDA-approved test that has been validated and its performance characteristics determined by the reporting laboratory.  This laboratory is certified under the Clinical Laboratory Improvement Amendments CLIA as qualified to perform high complexity clinical laboratory testing.    GENTAMICIN SYNERGY Value in next row Sensitive      <=4 SENSITIVEThis is a modified FDA-approved test  that has been validated and its performance characteristics determined by the reporting laboratory.  This laboratory is certified under the Clinical Laboratory Improvement Amendments CLIA as qualified to  perform high complexity clinical laboratory testing.    * ABUNDANT ENTEROCOCCUS FAECALIS   Proteus mirabilis - MIC*    AMPICILLIN Value in next row Sensitive      <=4 SENSITIVEThis is a modified FDA-approved test that has been validated and its performance characteristics determined by the reporting laboratory.  This laboratory is certified under the Clinical Laboratory Improvement Amendments CLIA as qualified to perform high complexity clinical laboratory testing.    CEFAZOLIN  (NON-URINE) Value in next row Intermediate      <=4 SENSITIVEThis is a modified FDA-approved test that has been validated and its performance characteristics determined by the reporting laboratory.  This laboratory is certified under the Clinical Laboratory Improvement Amendments CLIA as qualified to perform high complexity clinical laboratory testing.    CEFEPIME  Value in next row Sensitive      <=4 SENSITIVEThis is a modified FDA-approved test that has been validated and its performance characteristics determined by the reporting laboratory.  This laboratory is certified under the Clinical Laboratory Improvement Amendments CLIA as qualified to perform high complexity clinical laboratory testing.    ERTAPENEM Value in next row Sensitive      <=4 SENSITIVEThis is a modified FDA-approved test that has been validated and its performance characteristics determined by the reporting laboratory.  This laboratory is certified under the Clinical Laboratory Improvement Amendments CLIA as qualified to perform high complexity clinical laboratory testing.    CEFTRIAXONE  Value in next row Sensitive      <=4 SENSITIVEThis is a modified FDA-approved test that has been validated and its performance characteristics determined by the reporting laboratory.  This laboratory is certified under the Clinical Laboratory Improvement Amendments CLIA as qualified to perform high complexity clinical laboratory testing.    CIPROFLOXACIN  Value in next row  Sensitive      <=4 SENSITIVEThis is a modified FDA-approved test that has been validated and its performance characteristics determined by the reporting laboratory.  This laboratory is certified under the Clinical Laboratory Improvement Amendments CLIA as qualified to perform high complexity clinical laboratory testing.    GENTAMICIN Value in next row Sensitive      <=4 SENSITIVEThis is a modified FDA-approved test that has been validated and its performance characteristics determined by the reporting laboratory.  This laboratory is certified under the Clinical Laboratory Improvement Amendments CLIA as qualified to perform high complexity clinical laboratory testing.    MEROPENEM Value in next row Sensitive      <=4 SENSITIVEThis is a modified FDA-approved test that has been validated and its performance characteristics determined by the reporting laboratory.  This laboratory is certified under the Clinical Laboratory Improvement Amendments CLIA as qualified to perform high complexity clinical laboratory testing.    TRIMETH /SULFA  Value in next row Sensitive      <=4 SENSITIVEThis is a modified FDA-approved test that has been validated and its performance characteristics determined by the reporting laboratory.  This laboratory is certified under the Clinical Laboratory Improvement Amendments CLIA as qualified to perform high complexity clinical laboratory testing.    AMPICILLIN/SULBACTAM Value in next row Sensitive      <=4 SENSITIVEThis is a modified FDA-approved test that has been validated and its performance characteristics determined by the reporting laboratory.  This laboratory is certified under the Clinical Laboratory Improvement Amendments CLIA as qualified to perform high complexity clinical laboratory testing.  PIP/TAZO Value in next row Sensitive      <=4 SENSITIVEThis is a modified FDA-approved test that has been validated and its performance characteristics determined by the reporting  laboratory.  This laboratory is certified under the Clinical Laboratory Improvement Amendments CLIA as qualified to perform high complexity clinical laboratory testing.    * FEW PROTEUS MIRABILIS    Lines and Device Date on insertion # of days DC  Central line     Foley     ETT      Patient has: []  acute illness w/systemic sxs  [mod] [x]  illness posing risk to life or function  [high]  I reviewed:  (3+) [x]  primary team note [x]  consultant note(s) []  procedure/op note(s) []  micro result(s)   []  CBC results []  chemistry results []  radiology report(s) []  nursing note(s)  I independently visualized:  (any)   []  cxs/plates in lab [x]  plain film images []  CT images []  PET images   []  path slide(s) []  ECG tracing []  MRI images []  nuclear scan  I discussed: (any) []  micro and/or path w/lab personnel [x]  drug options and/or interactions w/ID pharmD   []  procedure/OR findings w/other MD(s) []  echo and/or imaging w/other MD(s)   []  mgm't w/attending(s) involved in case []  setting up home abx w/OPAT team  Mgm't requires: []  prescription drug(s)  [mod] [x]  intensive toxicity monitoring  [high]   IMAGING RESULTS: Left pleural effusion  I have personally reviewed the films ? 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, left orchidopexy in 2018 Presented on 1/7 with increasing sob and worsening cough  Sepsis secondary to complicated UTI due to infected nephrostomy Culture had multiple organisms including citrobacter, ecoli and proteus- These could be colonizers as the urone culture was sent from the old nephrostomy tube Pt is on IV ceftriaxone  and flagyl - will dc flagyl  Blood culture negative So will do 7-10 days of antibiotic   New onset Afib  seen by  cardiology on amio  CHF  Anemia- GI planning endo  ESRD on dialysis  thru HD cath   H/o cerebral aneursym s/p craniotomy  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 Discussed the management with patient and his nurse  ________________________________________________      [1] No Known Allergies

## 2024-06-03 NOTE — Progress Notes (Signed)
 " Progress Note   Patient: Larry Morrison FMW:[id number] DOB: 1971/03/27 DOA: 05/28/2024     6 DOS: the patient was seen and examined on 06/03/2024   Brief hospital course: PCCM transfer for 06/03/2024.  Partly taken from prior notes.  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  He recently presented to [facility] ED on 12/30 due to weakness, nausea and vomiting. He improved after arrival and was discharged back to the SNF he resides at. There was suspicion of mild acute pancreatitis given mild lipase elevation but no specific etiology was identified.   He presented back from his SNF on 1/7 with tachypnea and shortness of breath.  He was later transferred to ICU with concern of septic shock requiring pressors with infected right nephrostomy tube.  Urine and body fluid grew E. coli, Citrobacter, Proteus and Enterococcus faecalis.  Blood cultures remain negative.  Initially received broad-spectrum antibiotics which were later continued with ceftriaxone  and Flagyl .  There was also concern of aspiration pneumonitis.  Nephrostomy was exchanged by IR on 1/12.  Blood pressure later somewhat improved and his MAP goal was decreased to 55, he was transferred to [facility] on midodrine .  1/13: Overnight rapid was called as patient developed A-fib with RVR and became hypotensive.  Patient was also due for dialysis this morning which will be a challenge with this blood pressure.  Transfer to progressive care as there is no bed available in ICU.  Cardiology was also consulted. Also had dark-colored emesis, gastric occult blood was ordered, started on IV Protonix .  Trying to avoid antiemetics as his QTc is already prolonged and patient is currently on amiodarone .   If he continue to have nausea and vomiting, we will place NG tube.  Patient with significant underlying comorbidities and very fragile.  Palliative care was consulted but he wants everything need to be done.  Not sure whether he really comprehend or has capacity to make decisions.  DSS is legal guardian.  Assessment and Plan: * Septic shock [facility]) Patient initially admitted with concern of septic shock requiring pressors.  Currently on midodrine  with borderline blood pressure.  Urine and body fluid cultures grew multiple organisms which include E. coli, Citrobacter, Proteus and Enterobacter faecalis.  Blood cultures negative. Likely from infected nephrostomy tube.  Nephrostomy tube was exchanged.  There was also concern of aspiration pneumonia.  Patient received broad-spectrum antibiotics initially, later narrowed to ceftriaxone  and Flagyl .  - Continue with ceftriaxone  and Flagyl  -ID was consulted to help with type and duration of antibiotic for this complicated and sick gentleman  New onset a-fib (HCC) Rapid was called overnight when he developed new onset A-fib with RVR and became hypotensive after getting 1 dose of metoprolol . Cardiology was consulted and patient was started on amiodarone  infusion, now converted back to sinus rhythm. - Patient might need anticoagulation but there is a concern of coffee-ground emesis at this time so anticoagulation is currently being held. - He also has prolonged QTc, currently amiodarone  benefit outweighs the risk as he does not have any other option due to significant hypotension.  Should avoid any more QTc prolonging medications. - Appreciate cardiology help  Hypotension Patient again became hypotensive earlier, blood pressure now improved after converted back to sinus rhythm. -Continue with midodrine  -Avoid  any metoprolol  for rate control -Low threshold to start him on pressors  HFrEF (heart failure with reduced ejection fraction) (HCC) Patient  with advanced heart failure with EF of less than 20%.  Currently appears euvolemic.  Soft blood pressure so GDMT is not possible.  Home Coreg  and irbesartan  has been held. - Cardiology is on board -Volume is being managed with dialysis  Nausea and vomiting Patient developed nausea and vomiting, concern of coffee-ground emesis.  Gastric studies to rule out any bleeding was ordered but unfortunately lab stopped doing that test.  Hemoglobin currently stable. -Patient was made n.p.o. -KUB ordered -GI was consulted- -started on IV Protonix  -Monitor hemoglobin -Will get NG tube placed if continue to have more vomiting as trying to avoid any antiemetic due to already prolonged QTc and need of continuing amiodarone  for now  Anemia of chronic renal failure Hemoglobin currently stable at 8.5. Concern of coffee-ground emesis. - Monitor hemoglobin -Transfuse if below 8  ESRD on dialysis [facility]) Going for dialysis today. - Nephrology is on board and managing  Hypertension Patient is currently hypotensive and home Coreg  and irbesartan  has been held. - Monitor blood pressure  Protein calorie malnutrition Estimated body mass index is 18.47 kg/m as calculated from the following:   Height as of this encounter: 5' 4 (1.626 m).   Weight as of this encounter: 48.8 kg.    -Dietitian consult  Goals of care, counseling/discussion Palliative care was consulted, patient is severely debilitated with significant life limiting underlying comorbidities and very poor functional status.  Does not seem like he understand his illness.  Per ICU he wants to remain full scope of care and full code.  Discussed with his legal guardian Larry Morrison at DSS to discuss the gravity of his illness.  Patient is not a good candidate for CPR by any means.  Suggested to change CODE STATUS to DNR with full scope of medical care and let the nature take over if he arrested.  She is going to discuss with other DSS members and let us   know about their decision.   Subjective: Patient was feeling little nauseated when seen today.  Denies any chest pain.  Seems like having chronic dysarthria and poor insight of his medical condition.  Physical Exam: Vitals:   06/03/24 1048 06/03/24 1256 06/03/24 1300 06/03/24 1424  BP: 111/73  113/74 110/73  Pulse:    74  Resp:   14   Temp:  97.8 F (36.6 C)  97.7 F (36.5 C)  TempSrc:    Oral  SpO2:  100%  100%  Weight:      Height:       General.  Frail and severely malnourished gentleman, in no acute distress. Pulmonary.  Lungs clear bilaterally, normal respiratory effort. CV.  Regular rate and rhythm, no JVD, rub or murmur. Abdomen.  Soft, nontender, nondistended, BS positive. CNS.  Alert and oriented .  No focal neurologic deficit. Extremities.  No edema,  pulses intact and symmetrical. Psychiatry.  Judgment and insight appears impaired.   Data Reviewed: Prior data reviewed  Family Communication: Discussed with legal guardian Larry Morrison at DSS.  Disposition: Status is: Inpatient Remains inpatient appropriate because: Severity of illness  Planned Discharge Destination: Skilled nursing facility  DVT prophylaxis.  Subcu heparin  Time spent: 60 minutes  This record has been created using Conservation officer, historic buildings. Errors have been sought and corrected,but may not always be located. Such creation errors do not reflect on the standard of care.  Author: Amaryllis Dare, MD 06/03/2024 2:35 PM  For on call review [email or web address].  "

## 2024-06-03 NOTE — Significant Event (Signed)
 Rapid Response Event Note   Reason for Call :   Patient vomiting dark- coffee ground emesis Initial Focused Assessment:  Patient had vomited- Patient also had a cardiac rhythm of AFIB with RVR- Amio bolus was given.  Patient vitals stable and HR 90's at this time.       Interventions:  Cardiology at bedside- ordered to start Amio infusion and transfer to PCU.  Plan of Care:     Event Summary:   MD Notified: Dr. Caleen Call Time:10:25 Arrival Time:She did not arrive to bedside though she was paged for rapid. End Time:11:00  Allena JINNY Gunther, RN

## 2024-06-03 NOTE — Progress Notes (Signed)
 Amio bolus and LR bolus given. Patient in Afib RVR  140s-160s prior to starting Amio bolus . NP Rust chester Aware. Patient had medium amount of coffee ground emesis NP rust Chester aware Serial H&H ordered.  HR 90-108 After Amio bolus BP 97/59 O2 100%. NP okay with patient currently level of care since imporvment in patients HR following amio bolus.

## 2024-06-03 NOTE — Assessment & Plan Note (Signed)
 Palliative care was consulted, patient is severely debilitated with significant life limiting underlying comorbidities and very poor functional status.  Does not seem like he understand his illness.  Per ICU he wants to remain full scope of care and full code.  Discussed with his legal guardian Candice at DSS to discuss the gravity of his illness.  Patient is not a good candidate for CPR by any means.  Suggested to change CODE STATUS to DNR with full scope of medical care and let the nature take over if he arrested.  She is going to discuss with other DSS members and let us  know about their decision.

## 2024-06-03 NOTE — Progress Notes (Addendum)
 Notified Laneta Kells, NP at [phone] that patient had went into new onset Afib. Heart rate elevating into 140s. IV Metoprolo 2.5 mg ordered and administered. EKG obtained per her request. After administering the Metoprolol   the patient's BP decreased to 76/47. The patient also vomited at this time, emesis looked like coffee grounds. Shortly there after,  Lesley Shams, RN as well as the previously mentioned NP (Donati-Garman) arrived to patient's room. During all of this it was realized that Jenita Meek, NP was actually the provider on-call for this patient. They took over in place of Almarie Peter, NP. Stat labs were ordered, as well as an Amiodarone  Bolus and an LR Bolus. Both Boluses were administered by Lesley Shams, RN. Multiple BP's were taken and placed in flowsheets. Notified Rust-Chester, NP about subsequent low BP's placed in the flow-sheet and they said his MAP goal is 55. At this time all MAPs were above the goal. Shift change occurred and reported off to [employer], CHARITY FUNDRAISER.

## 2024-06-03 NOTE — Progress Notes (Signed)
 " [facility]  ROUNDING NOTE   Subjective:   Larry Morrison is a 54 year old male with past medical conditions including tobacco abuse, anemia, hypertension, TBI, malnutrition, combined heart failure, cocaine abuse, bilateral nephrostomy tubes and end-stage renal disease on hemodialysis.  Patient presents to the emergency department for shortness of breath and has been admitted for Shortness of breath [R06.02] General weakness [R53.1] ESRD on dialysis (HCC) [N18.6, Z99.2] Nephrostomy complication [N99.528] Sepsis (HCC) [A41.9] Elevated procalcitonin [R79.89] . Patient is known to our practice and receives outpatient dialysis treatments at [facility] on a MWF schedule, supervised by Dr. Douglas.    Update:   Patient seen laying in bed Alert, chronically ill appearing Voice soft Hypotensive   RRT called twice this morning for elevated HR, currently moving to PCU for amiodarone  drip  Objective:  Vital signs in last 24 hours:  Temp:  [97.7 F (36.5 C)-98.8 F (37.1 C)] 97.7 F (36.5 C) (01/13 0832) Pulse Rate:  [0-151] 95 (01/13 1025) Resp:  [0-36] 18 (01/13 0429) BP: (76-132)/(46-85) 111/73 (01/13 1048) SpO2:  [0 %-100 %] 95 % (01/13 1025)  Weight change:  Filed Weights   05/31/24 0500 06/01/24 0500 06/02/24 0500  Weight: 50.1 kg 48.8 kg 48.8 kg    Intake/Output: I/O last 3 completed shifts: In: 253 [P.O.:150; I.V.:3; IV Piggyback:100] Out: 0    Intake/Output this shift:  No intake/output data recorded.  Physical Exam: General: Laying in bed  Head: Normocephalic, atraumatic. Moist oral mucosal membranes  Eyes: Anicteric  Lungs:  crackles, cough, tachypnea   Heart: Regular rate and rhythm  Abdomen:  Soft, nontender, +urostomy tubes bilaterally  Extremities: 2+ peripheral edema.  Neurologic: Alert  Skin: Warm,dry, no rash  Access: Left chest IJ PermCath, left AVG nonfunctioning      Basic Metabolic Panel: Recent Labs  Lab 05/29/24 0621  05/30/24 0330 05/31/24 0350 06/01/24 0323 06/02/24 0537 06/03/24 0551  NA 137 137 134* 135 140 141  K 3.7 3.6 3.6 4.1 3.6 3.5  CL 92* 91* 93* 93* 92* 94*  CO2 20* 24 23 21* 23 20*  GLUCOSE 89 116* 88 60* 76 80  BUN 31* 47* 20 32* 46* 59*  CREATININE 6.59* 9.36* 5.46* 8.45* 11.50* 13.90*  CALCIUM  9.2 8.9 8.7* 9.0 9.2 9.3  MG 1.9 1.9  --  2.2 2.3 2.4  PHOS 5.5* 5.0* 3.7 5.3*  --  6.5*    Liver Function Tests: Recent Labs  Lab 05/28/24 1007 05/29/24 0621 05/30/24 0330 05/31/24 0350 06/01/24 0323 06/03/24 0551  AST 44*  --   --   --   --  24  ALT 53*  --   --   --   --  24  ALKPHOS 79  --   --   --   --  67  BILITOT 1.0  --   --   --   --  0.4  PROT 8.7*  --   --   --   --  7.9  ALBUMIN  3.6 3.1* 3.1* 3.0* 2.9* 3.0*   No results for input(s): LIPASE, AMYLASE in the last 168 hours. No results for input(s): AMMONIA in the last 168 hours.  CBC: Recent Labs  Lab 05/28/24 1007 05/29/24 0621 05/30/24 0330 05/31/24 0350 06/01/24 0323 06/02/24 0537 06/03/24 0551  WBC 16.3*   < > 15.1* 12.7* 14.1* 15.1* 10.7*  NEUTROABS 13.4*  --   --  9.9*  --   --   --   HGB 9.7*   < >  8.3* 8.1* 7.5* 8.1* 7.9*  HCT 31.1*   < > 26.1* 26.4* 24.1* 27.1* 25.6*  MCV 102.3*   < > 102.4* 103.1* 104.8* 107.1* 106.7*  PLT 229   < > 207 200 195 220 261   < > = values in this interval not displayed.    Cardiac Enzymes: No results for input(s): CKTOTAL, CKMB, CKMBINDEX, TROPONINI in the last 168 hours.  BNP: Invalid input(s): POCBNP  CBG: Recent Labs  Lab 05/31/24 0735 06/01/24 0552 06/01/24 0623 06/01/24 0751 06/03/24 1021  GLUCAP 96 63* 127* 83 87    Microbiology: Results for orders placed or performed during the hospital encounter of 05/28/24  Resp panel by RT-PCR (RSV, Flu A&B, Covid) Anterior Nasal Swab     Status: None   Collection Time: 05/28/24 10:07 AM   Specimen: Anterior Nasal Swab  Result Value Ref Range Status   SARS Coronavirus 2 by RT PCR NEGATIVE  NEGATIVE Final    Comment: (NOTE) SARS-CoV-2 target nucleic acids are NOT DETECTED.  The SARS-CoV-2 RNA is generally detectable in upper respiratory specimens during the acute phase of infection. The lowest concentration of SARS-CoV-2 viral copies this assay can detect is 138 copies/mL. A negative result does not preclude SARS-Cov-2 infection and should not be used as the sole basis for treatment or other patient management decisions. A negative result may occur with  improper specimen collection/handling, submission of specimen other than nasopharyngeal swab, presence of viral mutation(s) within the areas targeted by this assay, and inadequate number of viral copies(<138 copies/mL). A negative result must be combined with clinical observations, patient history, and epidemiological information. The expected result is Negative.  Fact Sheet for Patients:  bloggercourse.com  Fact Sheet for Healthcare Providers:  seriousbroker.it  This test is no t yet approved or cleared by the [place]  FDA and  has been authorized for detection and/or diagnosis of SARS-CoV-2 by FDA under an Emergency Use Authorization (EUA). This EUA will remain  in effect (meaning this test can be used) for the duration of the COVID-19 declaration under Section 564(b)(1) of the Act, 21 U.S.C.section 360bbb-3(b)(1), unless the authorization is terminated  or revoked sooner.       Influenza A by PCR NEGATIVE NEGATIVE Final   Influenza B by PCR NEGATIVE NEGATIVE Final    Comment: (NOTE) The Xpert Xpress SARS-CoV-2/FLU/RSV plus assay is intended as an aid in the diagnosis of influenza from Nasopharyngeal swab specimens and should not be used as a sole basis for treatment. Nasal washings and aspirates are unacceptable for Xpert Xpress SARS-CoV-2/FLU/RSV testing.  Fact Sheet for Patients: bloggercourse.com  Fact Sheet for Healthcare  Providers: seriousbroker.it  This test is not yet approved or cleared by the [place]  FDA and has been authorized for detection and/or diagnosis of SARS-CoV-2 by FDA under an Emergency Use Authorization (EUA). This EUA will remain in effect (meaning this test can be used) for the duration of the COVID-19 declaration under Section 564(b)(1) of the Act, 21 U.S.C. section 360bbb-3(b)(1), unless the authorization is terminated or revoked.     Resp Syncytial Virus by PCR NEGATIVE NEGATIVE Final    Comment: (NOTE) Fact Sheet for Patients: bloggercourse.com  Fact Sheet for Healthcare Providers: seriousbroker.it  This test is not yet approved or cleared by the [place]  FDA and has been authorized for detection and/or diagnosis of SARS-CoV-2 by FDA under an Emergency Use Authorization (EUA). This EUA will remain in effect (meaning this test can be used) for the duration of the  COVID-19 declaration under Section 564(b)(1) of the Act, 21 U.S.C. section 360bbb-3(b)(1), unless the authorization is terminated or revoked.  Performed at [facility], [address] [place], [place] [address]   Respiratory (~20 pathogens) panel by PCR     Status: None   Collection Time: 05/28/24 10:07 AM   Specimen: Nasopharyngeal Swab; Respiratory  Result Value Ref Range Status   Adenovirus NOT DETECTED NOT DETECTED Final   Coronavirus 229E NOT DETECTED NOT DETECTED Final    Comment: (NOTE) The Coronavirus on the Respiratory Panel, DOES NOT test for the novel  Coronavirus (2019 nCoV)    Coronavirus HKU1 NOT DETECTED NOT DETECTED Final   Coronavirus NL63 NOT DETECTED NOT DETECTED Final   Coronavirus OC43 NOT DETECTED NOT DETECTED Final   Metapneumovirus NOT DETECTED NOT DETECTED Final   Rhinovirus / Enterovirus NOT DETECTED NOT DETECTED Final   Influenza A NOT DETECTED NOT DETECTED Final   Influenza B NOT  DETECTED NOT DETECTED Final   Parainfluenza Virus 1 NOT DETECTED NOT DETECTED Final   Parainfluenza Virus 2 NOT DETECTED NOT DETECTED Final   Parainfluenza Virus 3 NOT DETECTED NOT DETECTED Final   Parainfluenza Virus 4 NOT DETECTED NOT DETECTED Final   Respiratory Syncytial Virus NOT DETECTED NOT DETECTED Final   Bordetella pertussis NOT DETECTED NOT DETECTED Final   Bordetella Parapertussis NOT DETECTED NOT DETECTED Final   Chlamydophila pneumoniae NOT DETECTED NOT DETECTED Final   Mycoplasma pneumoniae NOT DETECTED NOT DETECTED Final    Comment: Performed at [facility], [address] [place], [place] [address]  Blood culture (routine x 2)     Status: None   Collection Time: 05/28/24 12:52 PM   Specimen: BLOOD  Result Value Ref Range Status   Specimen Description BLOOD RIGHT ANTECUBITAL  Final   Special Requests   Final    BOTTLES DRAWN AEROBIC AND ANAEROBIC Blood Culture results may not be optimal due to an inadequate volume of blood received in culture bottles   Culture   Final    NO GROWTH 5 DAYS Performed at [facility], [address] [place], [place] [address]    Report Status 06/02/2024 FINAL  Final  Urine Culture     Status: Abnormal   Collection Time: 05/28/24 12:52 PM   Specimen: Urine, Catheterized  Result Value Ref Range Status   Specimen Description   Final    URINE, CATHETERIZED Performed at [facility], [address] [place], [place] [address]    Special Requests   Final    NONE Performed at [facility], [address] [place], [place] [address]    Culture (A)  Final    >=100,000 COLONIES/mL ESCHERICHIA COLI >=100,000 COLONIES/mL CITROBACTER KOSERI    Report Status 05/31/2024 FINAL  Final   Organism ID, Bacteria ESCHERICHIA COLI (A)  Final   Organism ID, Bacteria CITROBACTER KOSERI (A)  Final      Susceptibility   Citrobacter koseri - MIC*    CEFEPIME  <=0.12 SENSITIVE Sensitive     ERTAPENEM <=0.12 SENSITIVE  Sensitive     CEFTRIAXONE  <=0.25 SENSITIVE Sensitive     CIPROFLOXACIN  <=0.06 SENSITIVE Sensitive     GENTAMICIN <=1 SENSITIVE Sensitive     NITROFURANTOIN  32 SENSITIVE Sensitive     TRIMETH /SULFA  <=20 SENSITIVE Sensitive     PIP/TAZO Value in next row Sensitive      <=4 SENSITIVEThis is a modified FDA-approved test that has been validated and its performance characteristics determined by the reporting  laboratory.  This laboratory is certified under the Clinical Laboratory Improvement Amendments CLIA as qualified to perform high complexity clinical laboratory testing.    MEROPENEM Value in next row Sensitive      <=4 SENSITIVEThis is a modified FDA-approved test that has been validated and its performance characteristics determined by the reporting laboratory.  This laboratory is certified under the Clinical Laboratory Improvement Amendments CLIA as qualified to perform high complexity clinical laboratory testing.    * >=100,000 COLONIES/mL CITROBACTER KOSERI   Escherichia coli - MIC*    AMPICILLIN Value in next row Sensitive      <=4 SENSITIVEThis is a modified FDA-approved test that has been validated and its performance characteristics determined by the reporting laboratory.  This laboratory is certified under the Clinical Laboratory Improvement Amendments CLIA as qualified to perform high complexity clinical laboratory testing.    CEFAZOLIN  (URINE) Value in next row Sensitive      2 SENSITIVEThis is a modified FDA-approved test that has been validated and its performance characteristics determined by the reporting laboratory.  This laboratory is certified under the Clinical Laboratory Improvement Amendments CLIA as qualified to perform high complexity clinical laboratory testing.    CEFEPIME  Value in next row Sensitive      2 SENSITIVEThis is a modified FDA-approved test that has been validated and its performance characteristics determined by the reporting laboratory.  This laboratory is  certified under the Clinical Laboratory Improvement Amendments CLIA as qualified to perform high complexity clinical laboratory testing.    ERTAPENEM Value in next row Sensitive      2 SENSITIVEThis is a modified FDA-approved test that has been validated and its performance characteristics determined by the reporting laboratory.  This laboratory is certified under the Clinical Laboratory Improvement Amendments CLIA as qualified to perform high complexity clinical laboratory testing.    CEFTRIAXONE  Value in next row Sensitive      2 SENSITIVEThis is a modified FDA-approved test that has been validated and its performance characteristics determined by the reporting laboratory.  This laboratory is certified under the Clinical Laboratory Improvement Amendments CLIA as qualified to perform high complexity clinical laboratory testing.    CIPROFLOXACIN  Value in next row Sensitive      2 SENSITIVEThis is a modified FDA-approved test that has been validated and its performance characteristics determined by the reporting laboratory.  This laboratory is certified under the Clinical Laboratory Improvement Amendments CLIA as qualified to perform high complexity clinical laboratory testing.    GENTAMICIN Value in next row Sensitive      2 SENSITIVEThis is a modified FDA-approved test that has been validated and its performance characteristics determined by the reporting laboratory.  This laboratory is certified under the Clinical Laboratory Improvement Amendments CLIA as qualified to perform high complexity clinical laboratory testing.    NITROFURANTOIN  Value in next row Sensitive      2 SENSITIVEThis is a modified FDA-approved test that has been validated and its performance characteristics determined by the reporting laboratory.  This laboratory is certified under the Clinical Laboratory Improvement Amendments CLIA as qualified to perform high complexity clinical laboratory testing.    TRIMETH /SULFA  Value in next row  Sensitive      2 SENSITIVEThis is a modified FDA-approved test that has been validated and its performance characteristics determined by the reporting laboratory.  This laboratory is certified under the Clinical Laboratory Improvement Amendments CLIA as qualified to perform high complexity clinical laboratory testing.    AMPICILLIN/SULBACTAM Value in next row Sensitive  2 SENSITIVEThis is a modified FDA-approved test that has been validated and its performance characteristics determined by the reporting laboratory.  This laboratory is certified under the Clinical Laboratory Improvement Amendments CLIA as qualified to perform high complexity clinical laboratory testing.    PIP/TAZO Value in next row Sensitive      <=4 SENSITIVEThis is a modified FDA-approved test that has been validated and its performance characteristics determined by the reporting laboratory.  This laboratory is certified under the Clinical Laboratory Improvement Amendments CLIA as qualified to perform high complexity clinical laboratory testing.    MEROPENEM Value in next row Sensitive      <=4 SENSITIVEThis is a modified FDA-approved test that has been validated and its performance characteristics determined by the reporting laboratory.  This laboratory is certified under the Clinical Laboratory Improvement Amendments CLIA as qualified to perform high complexity clinical laboratory testing.    * >=100,000 COLONIES/mL ESCHERICHIA COLI  Expectorated Sputum Assessment w Gram Stain, Rflx to Resp Cult     Status: None   Collection Time: 05/28/24  1:18 PM   Specimen: Salivary Gland; Sputum  Result Value Ref Range Status   Specimen Description SALIVA  Final   Special Requests EXPSU  Final   Sputum evaluation   Final    THIS SPECIMEN IS ACCEPTABLE FOR SPUTUM CULTURE Performed at [facility], [address] [place], [place] [address]    Report Status 05/29/2024 FINAL  Final  Culture, Respiratory w Gram Stain     Status:  None   Collection Time: 05/28/24  1:18 PM  Result Value Ref Range Status   Specimen Description EXPECTORATED SPUTUM  Final   Special Requests Reflexed from [id number]  Final   Gram Stain   Final    FEW WBC PRESENT, PREDOMINANTLY PMN RARE GRAM NEGATIVE RODS Performed at [facility], [address], [place], [place] [address]    Culture RARE CANDIDA ALBICANS  Final   Report Status 06/01/2024 FINAL  Final  MRSA Next Gen by PCR, Nasal     Status: None   Collection Time: 05/28/24  9:40 PM   Specimen: Nasal Mucosa; Nasal Swab  Result Value Ref Range Status   MRSA by PCR Next Gen NOT DETECTED NOT DETECTED Final    Comment: (NOTE) The GeneXpert MRSA Assay (FDA approved for NASAL specimens only), is one component of a comprehensive MRSA colonization surveillance program. It is not intended to diagnose MRSA infection nor to guide or monitor treatment for MRSA infections. Test performance is not FDA approved in patients less than 52 years old. Performed at [facility], [address] [place], [place] [address]   Blood culture (routine x 2)     Status: None   Collection Time: 05/28/24 10:24 PM   Specimen: BLOOD  Result Value Ref Range Status   Specimen Description BLOOD BLOOD RIGHT HAND  Final   Special Requests   Final    BOTTLES DRAWN AEROBIC ONLY Blood Culture results may not be optimal due to an inadequate volume of blood received in culture bottles   Culture   Final    NO GROWTH 5 DAYS Performed at [facility] [address] [place], [place] [address]    Report Status 06/02/2024 FINAL  Final  Body fluid culture w Gram Stain     Status: None (Preliminary result)   Collection Time: 05/29/24  8:00 AM   Specimen: Other Source; Body Fluid  Result Value Ref Range Status   Specimen Description  Final    OTHER Performed at [facility], [address], [place], [place] [address]    Special Requests   Final    NONE Performed at [facility], [address] [place], [place] [address]    Gram Stain   Final    FEW WBC PRESENT, PREDOMINANTLY PMN MODERATE GRAM POSITIVE COCCI FEW GRAM NEGATIVE RODS Performed at [facility], [address], [place], [place] [address]    Culture   Final    ABUNDANT ENTEROCOCCUS FAECALIS ABUNDANT ESCHERICHIA COLI ABUNDANT CITROBACTER KOSERI FEW PROTEUS MIRABILIS    Report Status PENDING  Incomplete   Organism ID, Bacteria ESCHERICHIA COLI  Final   Organism ID, Bacteria CITROBACTER KOSERI  Final   Organism ID, Bacteria PROTEUS MIRABILIS  Final      Susceptibility   Citrobacter koseri - MIC*    CEFEPIME  <=0.12 SENSITIVE Sensitive     ERTAPENEM <=0.12 SENSITIVE Sensitive     CEFTRIAXONE  <=0.25 SENSITIVE Sensitive     CIPROFLOXACIN  <=0.06 SENSITIVE Sensitive     GENTAMICIN <=1 SENSITIVE Sensitive     MEROPENEM <=0.25 SENSITIVE Sensitive     TRIMETH /SULFA  <=20 SENSITIVE Sensitive     PIP/TAZO Value in next row Sensitive      <=4 SENSITIVEThis is a modified FDA-approved test that has been validated and its performance characteristics determined by the reporting laboratory.  This laboratory is certified under the Clinical Laboratory Improvement Amendments CLIA as qualified to perform high complexity clinical laboratory testing.    * ABUNDANT CITROBACTER KOSERI   Escherichia coli - MIC*    AMPICILLIN Value in next row Sensitive      <=4 SENSITIVEThis is a modified FDA-approved test that has been validated and its performance characteristics determined by the reporting laboratory.  This laboratory is certified under the Clinical Laboratory Improvement Amendments CLIA as qualified to perform high complexity clinical laboratory testing.    CEFAZOLIN  (NON-URINE) Value in next row Sensitive      <=4 SENSITIVEThis is a modified FDA-approved test that has been validated and its performance characteristics determined by the reporting laboratory.  This laboratory is certified under the Clinical  Laboratory Improvement Amendments CLIA as qualified to perform high complexity clinical laboratory testing.    CEFEPIME  Value in next row Sensitive      <=4 SENSITIVEThis is a modified FDA-approved test that has been validated and its performance characteristics determined by the reporting laboratory.  This laboratory is certified under the Clinical Laboratory Improvement Amendments CLIA as qualified to perform high complexity clinical laboratory testing.    ERTAPENEM Value in next row Sensitive      <=4 SENSITIVEThis is a modified FDA-approved test that has been validated and its performance characteristics determined by the reporting laboratory.  This laboratory is certified under the Clinical Laboratory Improvement Amendments CLIA as qualified to perform high complexity clinical laboratory testing.    CEFTRIAXONE  Value in next row Sensitive      <=4 SENSITIVEThis is a modified FDA-approved test that has been validated and its performance characteristics determined by the reporting laboratory.  This laboratory is certified under the Clinical Laboratory Improvement Amendments CLIA as qualified to perform high complexity clinical laboratory testing.    CIPROFLOXACIN  Value in next row Sensitive      <=4 SENSITIVEThis is a modified FDA-approved test that has been validated and its performance characteristics determined by the reporting laboratory.  This laboratory is certified under the Clinical Laboratory Improvement Amendments CLIA as qualified to perform high  complexity clinical laboratory testing.    GENTAMICIN Value in next row Sensitive      <=4 SENSITIVEThis is a modified FDA-approved test that has been validated and its performance characteristics determined by the reporting laboratory.  This laboratory is certified under the Clinical Laboratory Improvement Amendments CLIA as qualified to perform high complexity clinical laboratory testing.    MEROPENEM Value in next row Sensitive      <=4  SENSITIVEThis is a modified FDA-approved test that has been validated and its performance characteristics determined by the reporting laboratory.  This laboratory is certified under the Clinical Laboratory Improvement Amendments CLIA as qualified to perform high complexity clinical laboratory testing.    TRIMETH /SULFA  Value in next row Sensitive      <=4 SENSITIVEThis is a modified FDA-approved test that has been validated and its performance characteristics determined by the reporting laboratory.  This laboratory is certified under the Clinical Laboratory Improvement Amendments CLIA as qualified to perform high complexity clinical laboratory testing.    AMPICILLIN/SULBACTAM Value in next row Sensitive      <=4 SENSITIVEThis is a modified FDA-approved test that has been validated and its performance characteristics determined by the reporting laboratory.  This laboratory is certified under the Clinical Laboratory Improvement Amendments CLIA as qualified to perform high complexity clinical laboratory testing.    PIP/TAZO Value in next row Sensitive      <=4 SENSITIVEThis is a modified FDA-approved test that has been validated and its performance characteristics determined by the reporting laboratory.  This laboratory is certified under the Clinical Laboratory Improvement Amendments CLIA as qualified to perform high complexity clinical laboratory testing.    * ABUNDANT ESCHERICHIA COLI   Proteus mirabilis - MIC*    AMPICILLIN Value in next row Sensitive      <=4 SENSITIVEThis is a modified FDA-approved test that has been validated and its performance characteristics determined by the reporting laboratory.  This laboratory is certified under the Clinical Laboratory Improvement Amendments CLIA as qualified to perform high complexity clinical laboratory testing.    CEFAZOLIN  (NON-URINE) Value in next row Intermediate      <=4 SENSITIVEThis is a modified FDA-approved test that has been validated and its  performance characteristics determined by the reporting laboratory.  This laboratory is certified under the Clinical Laboratory Improvement Amendments CLIA as qualified to perform high complexity clinical laboratory testing.    CEFEPIME  Value in next row Sensitive      <=4 SENSITIVEThis is a modified FDA-approved test that has been validated and its performance characteristics determined by the reporting laboratory.  This laboratory is certified under the Clinical Laboratory Improvement Amendments CLIA as qualified to perform high complexity clinical laboratory testing.    ERTAPENEM Value in next row Sensitive      <=4 SENSITIVEThis is a modified FDA-approved test that has been validated and its performance characteristics determined by the reporting laboratory.  This laboratory is certified under the Clinical Laboratory Improvement Amendments CLIA as qualified to perform high complexity clinical laboratory testing.    CEFTRIAXONE  Value in next row Sensitive      <=4 SENSITIVEThis is a modified FDA-approved test that has been validated and its performance characteristics determined by the reporting laboratory.  This laboratory is certified under the Clinical Laboratory Improvement Amendments CLIA as qualified to perform high complexity clinical laboratory testing.    CIPROFLOXACIN  Value in next row Sensitive      <=4 SENSITIVEThis is a modified FDA-approved test that has been validated and its performance characteristics determined  by the reporting laboratory.  This laboratory is certified under the Clinical Laboratory Improvement Amendments CLIA as qualified to perform high complexity clinical laboratory testing.    GENTAMICIN Value in next row Sensitive      <=4 SENSITIVEThis is a modified FDA-approved test that has been validated and its performance characteristics determined by the reporting laboratory.  This laboratory is certified under the Clinical Laboratory Improvement Amendments CLIA as qualified  to perform high complexity clinical laboratory testing.    MEROPENEM Value in next row Sensitive      <=4 SENSITIVEThis is a modified FDA-approved test that has been validated and its performance characteristics determined by the reporting laboratory.  This laboratory is certified under the Clinical Laboratory Improvement Amendments CLIA as qualified to perform high complexity clinical laboratory testing.    TRIMETH /SULFA  Value in next row Sensitive      <=4 SENSITIVEThis is a modified FDA-approved test that has been validated and its performance characteristics determined by the reporting laboratory.  This laboratory is certified under the Clinical Laboratory Improvement Amendments CLIA as qualified to perform high complexity clinical laboratory testing.    AMPICILLIN/SULBACTAM Value in next row Sensitive      <=4 SENSITIVEThis is a modified FDA-approved test that has been validated and its performance characteristics determined by the reporting laboratory.  This laboratory is certified under the Clinical Laboratory Improvement Amendments CLIA as qualified to perform high complexity clinical laboratory testing.    PIP/TAZO Value in next row Sensitive      <=4 SENSITIVEThis is a modified FDA-approved test that has been validated and its performance characteristics determined by the reporting laboratory.  This laboratory is certified under the Clinical Laboratory Improvement Amendments CLIA as qualified to perform high complexity clinical laboratory testing.    * FEW PROTEUS MIRABILIS    Coagulation Studies: No results for input(s): LABPROT, INR in the last 72 hours.  Urinalysis: No results for input(s): COLORURINE, LABSPEC, PHURINE, GLUCOSEU, HGBUR, BILIRUBINUR, KETONESUR, PROTEINUR, UROBILINOGEN, NITRITE, LEUKOCYTESUR in the last 72 hours.  Invalid input(s): APPERANCEUR     Imaging: IR NEPHROSTOMY EXCHANGE RIGHT Result Date: 06/02/2024 INDICATION: Chronic  indwelling right nephrostomy, routine exchange EXAM: FLUOROSCOPIC EXCHANGE OF THE RIGHT 10 FRENCH NEPHROSTOMY COMPARISON:  None Available. MEDICATIONS: 1% lidocaine  local ANESTHESIA/SEDATION: Moderate (conscious) sedation was employed during this procedure. A total of Versed  1.0 mg and Fentanyl  50 mcg was administered intravenously by the radiology nurse. Total intra-service moderate Sedation Time: 5 minutes. The patient's level of consciousness and vital signs were monitored continuously by radiology nursing throughout the procedure under my direct supervision. CONTRAST:  15 cc-administered into the collecting system(s) FLUOROSCOPY: Radiation Exposure Index (as provided by the fluoroscopic device): 5.0 mGy Kerma COMPLICATIONS: None immediate. PROCEDURE: Informed written consent was obtained from the patient after a thorough discussion of the procedural risks, benefits and alternatives. All questions were addressed. Maximal Sterile Barrier Technique was utilized including caps, mask, sterile gowns, sterile gloves, sterile drape, hand hygiene and skin antiseptic. A timeout was performed prior to the initiation of the procedure. Previous imaging reviewed. Preliminary injection confirms position of the nephrostomy within the renal pelvis. Successful exchange performed over an Amplatz guidewire. Retention loop formed in the renal pelvis. Contrast injection confirms position. Images again obtained for documentation. Patient tolerated the procedure well. No immediate complication. Catheter secured with a silk suture. Gravity drainage bag connected followed by sterile dressing. No immediate complication. Patient tolerated the procedure well. IMPRESSION: Successful fluoroscopic exchange of the right 10 French nephrostomy Electronically Signed  By: EMERSON Specking M.D.   On: 06/02/2024 14:17   CT ABDOMEN PELVIS WO CONTRAST Addendum Date: 06/01/2024 ADDENDUM REPORT: 06/01/2024 13:08 ADDENDUM: Not mentioned above is mild  Pericystic edema which may represent residual cystitis. Electronically Signed   By: Rockey Kilts M.D.   On: 06/01/2024 13:08   Result Date: 06/01/2024 CLINICAL DATA:  Sepsis.  Evaluate position of nephrostomy tube. EXAM: CT ABDOMEN AND PELVIS WITHOUT CONTRAST TECHNIQUE: Multidetector CT imaging of the abdomen and pelvis was performed following the standard protocol without IV contrast. RADIATION DOSE REDUCTION: This exam was performed according to the departmental dose-optimization program which includes automated exposure control, adjustment of the mA and/or kV according to patient size and/or use of iterative reconstruction technique. COMPARISON:  11/27/2023 FINDINGS: Lower chest: Left base dependent airspace disease is similar. New or significantly progressive right base airspace and ground-glass opacity. 2 mm right lower lobe pulmonary nodule can be presumed benign and do/does not warrant imaging follow-up per Fleischner criteria. Moderate cardiomegaly. Similar small left pleural effusion and pleural thickening. New trace right pleural fluid. Small pericardial effusion is unchanged. Dialysis catheter tip mid to low right atrium. Moderately dilated distal esophagus which is incompletely imaged but fluid-filled. Hepatobiliary: Dependent gallstones. Normal noncontrast appearance of the liver. No biliary duct dilatation or evidence of acute cholecystitis. Pancreas: Normal, without mass or ductal dilatation. Spleen: Normal in size, without focal abnormality. Adrenals/Urinary Tract: Normal adrenal glands. Absent left kidney. Moderate right renal atrophy. Right nephrostomy catheter is similar in position, without hydronephrosis or pericatheter fluid collection. Mild distal right hydroureter is similar and likely physiologic. Mild pericystic edema is improved. Similar 1.2 cm focus of left bladder base heterogeneous increased density including on image 66/2. Stomach/Bowel: Normal stomach, without wall thickening.  Fluid-filled colon. Normal terminal ileum and appendix. Multifactorial degradation, including EKG wires and leads, arm position, lack of oral or IV contrast. Normal small bowel. Vascular/Lymphatic: Aortic atherosclerosis. No abdominopelvic adenopathy. Reproductive: Normal prostate. Other: No significant free fluid.  No free intraperitoneal air. Musculoskeletal: Probable renal osteodystrophy. Mild convex left lumbar spine curvature. IMPRESSION: Multifactorial degradation, as detailed above. Appropriate position of right-sided nephrostomy catheter, within a moderately atrophic right kidney. Absent left kidney. Heterogeneous hyperattenuating left-sided bladder lesion is suspicious for urothelial carcinoma. Recommend cystoscopy if not already performed. New or increased right lower lobe airspace disease, suspicious for aspiration or infection. Dilated lower esophagus is fluid-filled, suggesting dysmotility and/or gastroesophageal reflux. This would predispose the patient to aspiration. Cholelithiasis. Fluid-filled colon, suggesting a diarrheal state. Aortic Atherosclerosis (ICD10-I70.0). Electronically Signed: By: Rockey Kilts M.D. On: 06/01/2024 12:56      Medications:    sodium chloride  10 mL/hr at 06/02/24 1158   sodium chloride      amiodarone  60 mg/hr (06/03/24 1038)   amiodarone      cefTRIAXone  (ROCEPHIN )  IV 1 g (06/02/24 1009)   metronidazole  500 mg (06/03/24 0428)   norepinephrine  (LEVOPHED ) Adult infusion      atorvastatin   40 mg Oral Daily   calcium  acetate  1,334 mg Oral TID WC   Chlorhexidine  Gluconate Cloth  6 each Topical Q0600   epoetin  alfa-epbx (RETACRIT ) injection  10,000 Units Intravenous Q M,W,F-1800   feeding supplement (NEPRO CARB STEADY)  237 mL Oral TID BM   heparin   5,000 Units Subcutaneous Q8H   lactulose   30 g Oral Once   levothyroxine   50 mcg Oral Daily   lidocaine -prilocaine   1 Application Topical Once per day on Monday Wednesday Friday   midodrine   10 mg Oral TID  WC    multivitamin  1 tablet Oral QHS   mouth rinse  15 mL Mouth Rinse 4 times per day   pantoprazole  (PROTONIX ) IV  40 mg Intravenous Q12H   sodium chloride  flush  3 mL Intravenous Q12H   acetaminophen  **OR** acetaminophen , diphenhydrAMINE , famotidine , HYDROmorphone  (DILAUDID ) injection, ipratropium-albuterol , lidocaine -prilocaine , methylPREDNISolone  (SOLU-MEDROL ) injection, midazolam , mouth rinse, mouth rinse, oxyCODONE , pentafluoroprop-tetrafluoroeth  Assessment/ Plan:  Larry Morrison is a 54 y.o.  male with past medical conditions including tobacco abuse, anemia, hypertension, TBI, malnutrition, combined heart failure, cocaine abuse, bilateral nephrostomy tubes and end-stage renal disease on hemodialysis.  Patient presents to the emergency department for shortness of breath and has been admitted for Shortness of breath [R06.02] General weakness [R53.1] ESRD on dialysis (HCC) [N18.6, Z99.2] Nephrostomy complication [N99.528] Sepsis (HCC) [A41.9] Elevated procalcitonin [R79.89]    End-stage renal disease on hemodialysis. Scheduled to receive dialysis today, no UF due to recent cardiac concerns. Can perform UF only treatment tomorrow, if fluid removal is needed. May need to consider goals of care discussion.   2.  Sepsis with E. Coli and Citrobacter species growing from urine.  With hypotension requiring vasopressors.  IR planning nephrostomy exchange this admission - ceftriaxone  and metronidazole    3. Anemia of chronic kidney disease Lab Results  Component Value Date   HGB 7.9 (L) 06/03/2024  Hgb remains decreased ESA with HD treatments  4. Secondary Hyperparathyroidism: with outpatient labs: PTH 438, phosphorus 5.9, calcium  8.3 on 04/28/24.   Lab Results  Component Value Date   CALCIUM  9.3 06/03/2024   CAION 0.79 (LL) 09/23/2021   PHOS 6.5 (H) 06/03/2024    Patient prescribed calcitriol  and calcium  acetate outpatient.   5.  Hypotensive with history of hypertension with  chronic kidney disease.  Home regimen includes carvedilol  and irbesartan  being held - weaned off vasopressor: norepinephrine .  - Continue midodrine .    LOS: 6 Emmerson Taddei 1/13/202611:22 AM   "

## 2024-06-03 NOTE — Progress Notes (Signed)
 Pt alert and oriented. Tolerated tx well. No c/o at this time. CVC remains patent, no s/s of infection noted. Hand off given to Dorise Kerns, RN.

## 2024-06-03 NOTE — Assessment & Plan Note (Signed)
 Going for dialysis today. - Nephrology is on board and managing

## 2024-06-03 NOTE — Assessment & Plan Note (Signed)
 Rapid was called overnight when he developed new onset A-fib with RVR and became hypotensive after getting 1 dose of metoprolol . Cardiology was consulted and patient was started on amiodarone  infusion, now converted back to sinus rhythm. - Patient might need anticoagulation but there is a concern of coffee-ground emesis at this time so anticoagulation is currently being held. - He also has prolonged QTc, currently amiodarone  benefit outweighs the risk as he does not have any other option due to significant hypotension.  Should avoid any more QTc prolonging medications. - Appreciate cardiology help

## 2024-06-04 ENCOUNTER — Encounter
Admission: EM | Disposition: A | Discharge status: Skilled Nursing Facility | Payer: Self-pay | Source: Skilled Nursing Facility | Attending: Internal Medicine

## 2024-06-04 ENCOUNTER — Encounter: Payer: Self-pay | Admitting: Anesthesiology

## 2024-06-04 DIAGNOSIS — N186 End stage renal disease: Secondary | ICD-10-CM | POA: Diagnosis not present

## 2024-06-04 DIAGNOSIS — R6521 Severe sepsis with septic shock: Secondary | ICD-10-CM

## 2024-06-04 DIAGNOSIS — I4891 Unspecified atrial fibrillation: Secondary | ICD-10-CM | POA: Diagnosis not present

## 2024-06-04 DIAGNOSIS — E43 Unspecified severe protein-calorie malnutrition: Secondary | ICD-10-CM | POA: Insufficient documentation

## 2024-06-04 DIAGNOSIS — Z992 Dependence on renal dialysis: Secondary | ICD-10-CM | POA: Diagnosis not present

## 2024-06-04 DIAGNOSIS — R112 Nausea with vomiting, unspecified: Secondary | ICD-10-CM | POA: Diagnosis not present

## 2024-06-04 DIAGNOSIS — I959 Hypotension, unspecified: Secondary | ICD-10-CM | POA: Diagnosis not present

## 2024-06-04 DIAGNOSIS — R0602 Shortness of breath: Secondary | ICD-10-CM | POA: Diagnosis not present

## 2024-06-04 DIAGNOSIS — N390 Urinary tract infection, site not specified: Secondary | ICD-10-CM | POA: Diagnosis not present

## 2024-06-04 DIAGNOSIS — A419 Sepsis, unspecified organism: Secondary | ICD-10-CM

## 2024-06-04 DIAGNOSIS — R531 Weakness: Secondary | ICD-10-CM | POA: Diagnosis not present

## 2024-06-04 DIAGNOSIS — R9431 Abnormal electrocardiogram [ECG] [EKG]: Secondary | ICD-10-CM

## 2024-06-04 DIAGNOSIS — N99528 Other complication of other external stoma of urinary tract: Secondary | ICD-10-CM | POA: Diagnosis not present

## 2024-06-04 LAB — CBC
HCT: 27.4 % — ABNORMAL LOW (ref 39.0–52.0)
Hemoglobin: 8.5 g/dL — ABNORMAL LOW (ref 13.0–17.0)
MCH: 30.7 pg (ref 26.0–34.0)
MCHC: 31 g/dL (ref 30.0–36.0)
MCV: 98.9 fL (ref 80.0–100.0)
Platelets: 207 K/uL (ref 150–400)
RBC: 2.77 MIL/uL — ABNORMAL LOW (ref 4.22–5.81)
RDW: 18 % — ABNORMAL HIGH (ref 11.5–15.5)
WBC: 8.6 K/uL (ref 4.0–10.5)
nRBC: 0.9 % — ABNORMAL HIGH (ref 0.0–0.2)

## 2024-06-04 LAB — THYROID PANEL WITH TSH
Free Thyroxine Index: 2.9 (ref 1.2–4.9)
T3 Uptake Ratio: 36 % (ref 24–39)
T4, Total: 8 ug/dL (ref 4.5–12.0)
TSH: 5.05 u[IU]/mL — ABNORMAL HIGH (ref 0.450–4.500)

## 2024-06-04 LAB — RENAL FUNCTION PANEL
Albumin: 2.7 g/dL — ABNORMAL LOW (ref 3.5–5.0)
Anion gap: 15 (ref 5–15)
BUN: 21 mg/dL — ABNORMAL HIGH (ref 6–20)
CO2: 26 mmol/L (ref 22–32)
Calcium: 8.5 mg/dL — ABNORMAL LOW (ref 8.9–10.3)
Chloride: 96 mmol/L — ABNORMAL LOW (ref 98–111)
Creatinine, Ser: 7.04 mg/dL — ABNORMAL HIGH (ref 0.61–1.24)
GFR, Estimated: 9 mL/min — ABNORMAL LOW
Glucose, Bld: 67 mg/dL — ABNORMAL LOW (ref 70–99)
Phosphorus: 4.2 mg/dL (ref 2.5–4.6)
Potassium: 3.2 mmol/L — ABNORMAL LOW (ref 3.5–5.1)
Sodium: 137 mmol/L (ref 135–145)

## 2024-06-04 LAB — HEMOGLOBIN AND HEMATOCRIT, BLOOD
HCT: 24.5 % — ABNORMAL LOW (ref 39.0–52.0)
Hemoglobin: 7.6 g/dL — ABNORMAL LOW (ref 13.0–17.0)

## 2024-06-04 LAB — PREPARE RBC (CROSSMATCH)

## 2024-06-04 MED ORDER — HEPARIN SODIUM (PORCINE) 1000 UNIT/ML IJ SOLN
INTRAMUSCULAR | Status: AC
  Filled 2024-06-04: qty 5

## 2024-06-04 MED ORDER — DIPHENHYDRAMINE HCL 25 MG PO CAPS
25.0000 mg | ORAL_CAPSULE | Freq: Once | ORAL | Status: AC
  Administered 2024-06-04: 25 mg via ORAL
  Filled 2024-06-04: qty 1

## 2024-06-04 MED ORDER — ACETAMINOPHEN 325 MG PO TABS
650.0000 mg | ORAL_TABLET | Freq: Once | ORAL | Status: AC
  Administered 2024-06-04: 650 mg via ORAL
  Filled 2024-06-04: qty 2

## 2024-06-04 MED ORDER — CARVEDILOL 3.125 MG PO TABS
3.1250 mg | ORAL_TABLET | Freq: Two times a day (BID) | ORAL | Status: DC
  Administered 2024-06-04 – 2024-06-05 (×2): 3.125 mg via ORAL
  Filled 2024-06-04 (×2): qty 1

## 2024-06-04 MED ORDER — EPOETIN ALFA-EPBX 10000 UNIT/ML IJ SOLN
INTRAMUSCULAR | Status: AC
  Filled 2024-06-04: qty 1

## 2024-06-04 MED ORDER — AMIODARONE HCL 200 MG PO TABS
200.0000 mg | ORAL_TABLET | Freq: Every day | ORAL | Status: DC
  Administered 2024-06-04 – 2024-06-05 (×2): 200 mg via ORAL
  Filled 2024-06-04 (×2): qty 1

## 2024-06-04 MED ORDER — SODIUM CHLORIDE 0.9% IV SOLUTION: Freq: Once | INTRAVENOUS | Status: DC

## 2024-06-04 NOTE — Progress Notes (Signed)
Patient refused morning labs. MD made aware.

## 2024-06-04 NOTE — Progress Notes (Signed)
 " [facility]  ROUNDING NOTE   Subjective:   Larry Morrison is a 54 year old male with past medical conditions including tobacco abuse, anemia, hypertension, TBI, malnutrition, combined heart failure, cocaine abuse, bilateral nephrostomy tubes and end-stage renal disease on hemodialysis.  Patient presents to the emergency department for shortness of breath and has been admitted for Shortness of breath [R06.02] General weakness [R53.1] ESRD on dialysis (HCC) [N18.6, Z99.2] Nephrostomy complication [N99.528] Sepsis (HCC) [A41.9] Elevated procalcitonin [R79.89] . Patient is known to our practice and receives outpatient dialysis treatments at [facility] on a MWF schedule, supervised by Dr. Douglas.    Update:   Patient seen and evaluated during dialysis   HEMODIALYSIS FLOWSHEET:  Blood Flow Rate (mL/min): 379 mL/min Arterial Pressure (mmHg): -213.12 mmHg Venous Pressure (mmHg): 164.23 mmHg TMP (mmHg): 4.64 mmHg Ultrafiltration Rate (mL/min): 543 mL/min Dialysate Flow Rate (mL/min): 299 ml/min Dialysis Fluid Bolus: Normal Saline Bolus Amount (mL): 100 mL  Tolerating treatment well More alert today  Objective:  Vital signs in last 24 hours:  Temp:  [97.7 F (36.5 C)-98.9 F (37.2 C)] 98 F (36.7 C) (01/14 0810) Pulse Rate:  [61-151] 98 (01/14 1000) Resp:  [14-43] 28 (01/14 1000) BP: (101-132)/(55-89) 125/81 (01/14 1000) SpO2:  [95 %-100 %] 99 % (01/14 1000) Weight:  [46.2 kg-50.2 kg] 50.2 kg (01/14 0810)  Weight change:  Filed Weights   06/03/24 1753 06/04/24 0500 06/04/24 0810  Weight: 46.5 kg 46.2 kg 50.2 kg    Intake/Output: I/O last 3 completed shifts: In: 438 [P.O.:120; I.V.:3; Blood:315] Out: 600 [Other:600]   Intake/Output this shift:  No intake/output data recorded.  Physical Exam: General: Laying in bed  Head: Normocephalic, atraumatic. Moist oral mucosal membranes  Eyes: Anicteric  Lungs:  crackles, cough, tachypnea   Heart: Regular  rate and rhythm  Abdomen:  Soft, nontender, +urostomy tubes bilaterally  Extremities: 2+ peripheral edema.  Neurologic: Alert  Skin: Warm,dry, no rash  Access: Left chest IJ PermCath, left AVG nonfunctioning      Basic Metabolic Panel: Recent Labs  Lab 05/29/24 0621 05/30/24 0330 05/31/24 0350 06/01/24 0323 06/02/24 0537 06/03/24 0551 06/04/24 0830  NA 137 137 134* 135 140 141 137  K 3.7 3.6 3.6 4.1 3.6 3.5 3.2*  CL 92* 91* 93* 93* 92* 94* 96*  CO2 20* 24 23 21* 23 20* 26  GLUCOSE 89 116* 88 60* 76 80 67*  BUN 31* 47* 20 32* 46* 59* 21*  CREATININE 6.59* 9.36* 5.46* 8.45* 11.50* 13.90* 7.04*  CALCIUM  9.2 8.9 8.7* 9.0 9.2 9.3 8.5*  MG 1.9 1.9  --  2.2 2.3 2.4  --   PHOS 5.5* 5.0* 3.7 5.3*  --  6.5* 4.2    Liver Function Tests: Recent Labs  Lab 05/30/24 0330 05/31/24 0350 06/01/24 0323 06/03/24 0551 06/04/24 0830  AST  --   --   --  24  --   ALT  --   --   --  24  --   ALKPHOS  --   --   --  67  --   BILITOT  --   --   --  0.4  --   PROT  --   --   --  7.9  --   ALBUMIN  3.1* 3.0* 2.9* 3.0* 2.7*   No results for input(s): LIPASE, AMYLASE in the last 168 hours. No results for input(s): AMMONIA in the last 168 hours.  CBC: Recent Labs  Lab 05/31/24 0350 06/01/24 0323 06/02/24  9462 06/03/24 0551 06/03/24 1117 06/03/24 2344 06/04/24 0830  WBC 12.7* 14.1* 15.1* 10.7*  --   --  8.6  NEUTROABS 9.9*  --   --   --   --   --   --   HGB 8.1* 7.5* 8.1* 7.9* 8.5* 7.6* 8.5*  HCT 26.4* 24.1* 27.1* 25.6* 27.2* 24.5* 27.4*  MCV 103.1* 104.8* 107.1* 106.7*  --   --  98.9  PLT 200 195 220 261  --   --  207    Cardiac Enzymes: No results for input(s): CKTOTAL, CKMB, CKMBINDEX, TROPONINI in the last 168 hours.  BNP: Invalid input(s): POCBNP  CBG: Recent Labs  Lab 05/31/24 0735 06/01/24 0552 06/01/24 0623 06/01/24 0751 06/03/24 1021  GLUCAP 96 63* 127* 83 87    Microbiology: Results for orders placed or performed during the hospital encounter  of 05/28/24  Resp panel by RT-PCR (RSV, Flu A&B, Covid) Anterior Nasal Swab     Status: None   Collection Time: 05/28/24 10:07 AM   Specimen: Anterior Nasal Swab  Result Value Ref Range Status   SARS Coronavirus 2 by RT PCR NEGATIVE NEGATIVE Final    Comment: (NOTE) SARS-CoV-2 target nucleic acids are NOT DETECTED.  The SARS-CoV-2 RNA is generally detectable in upper respiratory specimens during the acute phase of infection. The lowest concentration of SARS-CoV-2 viral copies this assay can detect is 138 copies/mL. A negative result does not preclude SARS-Cov-2 infection and should not be used as the sole basis for treatment or other patient management decisions. A negative result may occur with  improper specimen collection/handling, submission of specimen other than nasopharyngeal swab, presence of viral mutation(s) within the areas targeted by this assay, and inadequate number of viral copies(<138 copies/mL). A negative result must be combined with clinical observations, patient history, and epidemiological information. The expected result is Negative.  Fact Sheet for Patients:  bloggercourse.com  Fact Sheet for Healthcare Providers:  seriousbroker.it  This test is no t yet approved or cleared by the [place]  FDA and  has been authorized for detection and/or diagnosis of SARS-CoV-2 by FDA under an Emergency Use Authorization (EUA). This EUA will remain  in effect (meaning this test can be used) for the duration of the COVID-19 declaration under Section 564(b)(1) of the Act, 21 U.S.C.section 360bbb-3(b)(1), unless the authorization is terminated  or revoked sooner.       Influenza A by PCR NEGATIVE NEGATIVE Final   Influenza B by PCR NEGATIVE NEGATIVE Final    Comment: (NOTE) The Xpert Xpress SARS-CoV-2/FLU/RSV plus assay is intended as an aid in the diagnosis of influenza from Nasopharyngeal swab specimens and should  not be used as a sole basis for treatment. Nasal washings and aspirates are unacceptable for Xpert Xpress SARS-CoV-2/FLU/RSV testing.  Fact Sheet for Patients: bloggercourse.com  Fact Sheet for Healthcare Providers: seriousbroker.it  This test is not yet approved or cleared by the [place]  FDA and has been authorized for detection and/or diagnosis of SARS-CoV-2 by FDA under an Emergency Use Authorization (EUA). This EUA will remain in effect (meaning this test can be used) for the duration of the COVID-19 declaration under Section 564(b)(1) of the Act, 21 U.S.C. section 360bbb-3(b)(1), unless the authorization is terminated or revoked.     Resp Syncytial Virus by PCR NEGATIVE NEGATIVE Final    Comment: (NOTE) Fact Sheet for Patients: bloggercourse.com  Fact Sheet for Healthcare Providers: seriousbroker.it  This test is not yet approved or cleared by the [place]  FDA  and has been authorized for detection and/or diagnosis of SARS-CoV-2 by FDA under an Emergency Use Authorization (EUA). This EUA will remain in effect (meaning this test can be used) for the duration of the COVID-19 declaration under Section 564(b)(1) of the Act, 21 U.S.C. section 360bbb-3(b)(1), unless the authorization is terminated or revoked.  Performed at [facility], [address] [place], [place] [address]   Respiratory (~20 pathogens) panel by PCR     Status: None   Collection Time: 05/28/24 10:07 AM   Specimen: Nasopharyngeal Swab; Respiratory  Result Value Ref Range Status   Adenovirus NOT DETECTED NOT DETECTED Final   Coronavirus 229E NOT DETECTED NOT DETECTED Final    Comment: (NOTE) The Coronavirus on the Respiratory Panel, DOES NOT test for the novel  Coronavirus (2019 nCoV)    Coronavirus HKU1 NOT DETECTED NOT DETECTED Final   Coronavirus NL63 NOT DETECTED NOT DETECTED  Final   Coronavirus OC43 NOT DETECTED NOT DETECTED Final   Metapneumovirus NOT DETECTED NOT DETECTED Final   Rhinovirus / Enterovirus NOT DETECTED NOT DETECTED Final   Influenza A NOT DETECTED NOT DETECTED Final   Influenza B NOT DETECTED NOT DETECTED Final   Parainfluenza Virus 1 NOT DETECTED NOT DETECTED Final   Parainfluenza Virus 2 NOT DETECTED NOT DETECTED Final   Parainfluenza Virus 3 NOT DETECTED NOT DETECTED Final   Parainfluenza Virus 4 NOT DETECTED NOT DETECTED Final   Respiratory Syncytial Virus NOT DETECTED NOT DETECTED Final   Bordetella pertussis NOT DETECTED NOT DETECTED Final   Bordetella Parapertussis NOT DETECTED NOT DETECTED Final   Chlamydophila pneumoniae NOT DETECTED NOT DETECTED Final   Mycoplasma pneumoniae NOT DETECTED NOT DETECTED Final    Comment: Performed at [facility], [address] [place], [place] [address]  Blood culture (routine x 2)     Status: None   Collection Time: 05/28/24 12:52 PM   Specimen: BLOOD  Result Value Ref Range Status   Specimen Description BLOOD RIGHT ANTECUBITAL  Final   Special Requests   Final    BOTTLES DRAWN AEROBIC AND ANAEROBIC Blood Culture results may not be optimal due to an inadequate volume of blood received in culture bottles   Culture   Final    NO GROWTH 5 DAYS Performed at [facility], [address] [place], [place] [address]    Report Status 06/02/2024 FINAL  Final  Urine Culture     Status: Abnormal   Collection Time: 05/28/24 12:52 PM   Specimen: Urine, Catheterized  Result Value Ref Range Status   Specimen Description   Final    URINE, CATHETERIZED Performed at [facility], [address] [place], [place] [address]    Special Requests   Final    NONE Performed at [facility], [address] [place], [place] [address]    Culture (A)  Final    >=100,000 COLONIES/mL ESCHERICHIA COLI >=100,000 COLONIES/mL CITROBACTER KOSERI    Report Status 05/31/2024 FINAL   Final   Organism ID, Bacteria ESCHERICHIA COLI (A)  Final   Organism ID, Bacteria CITROBACTER KOSERI (A)  Final      Susceptibility   Citrobacter koseri - MIC*    CEFEPIME  <=0.12 SENSITIVE Sensitive     ERTAPENEM <=0.12 SENSITIVE Sensitive     CEFTRIAXONE  <=0.25 SENSITIVE Sensitive     CIPROFLOXACIN  <=0.06 SENSITIVE Sensitive     GENTAMICIN <=1 SENSITIVE Sensitive     NITROFURANTOIN  32 SENSITIVE Sensitive     TRIMETH /SULFA  <=20 SENSITIVE  Sensitive     PIP/TAZO Value in next row Sensitive      <=4 SENSITIVEThis is a modified FDA-approved test that has been validated and its performance characteristics determined by the reporting laboratory.  This laboratory is certified under the Clinical Laboratory Improvement Amendments CLIA as qualified to perform high complexity clinical laboratory testing.    MEROPENEM Value in next row Sensitive      <=4 SENSITIVEThis is a modified FDA-approved test that has been validated and its performance characteristics determined by the reporting laboratory.  This laboratory is certified under the Clinical Laboratory Improvement Amendments CLIA as qualified to perform high complexity clinical laboratory testing.    * >=100,000 COLONIES/mL CITROBACTER KOSERI   Escherichia coli - MIC*    AMPICILLIN Value in next row Sensitive      <=4 SENSITIVEThis is a modified FDA-approved test that has been validated and its performance characteristics determined by the reporting laboratory.  This laboratory is certified under the Clinical Laboratory Improvement Amendments CLIA as qualified to perform high complexity clinical laboratory testing.    CEFAZOLIN  (URINE) Value in next row Sensitive      2 SENSITIVEThis is a modified FDA-approved test that has been validated and its performance characteristics determined by the reporting laboratory.  This laboratory is certified under the Clinical Laboratory Improvement Amendments CLIA as qualified to perform high complexity clinical  laboratory testing.    CEFEPIME  Value in next row Sensitive      2 SENSITIVEThis is a modified FDA-approved test that has been validated and its performance characteristics determined by the reporting laboratory.  This laboratory is certified under the Clinical Laboratory Improvement Amendments CLIA as qualified to perform high complexity clinical laboratory testing.    ERTAPENEM Value in next row Sensitive      2 SENSITIVEThis is a modified FDA-approved test that has been validated and its performance characteristics determined by the reporting laboratory.  This laboratory is certified under the Clinical Laboratory Improvement Amendments CLIA as qualified to perform high complexity clinical laboratory testing.    CEFTRIAXONE  Value in next row Sensitive      2 SENSITIVEThis is a modified FDA-approved test that has been validated and its performance characteristics determined by the reporting laboratory.  This laboratory is certified under the Clinical Laboratory Improvement Amendments CLIA as qualified to perform high complexity clinical laboratory testing.    CIPROFLOXACIN  Value in next row Sensitive      2 SENSITIVEThis is a modified FDA-approved test that has been validated and its performance characteristics determined by the reporting laboratory.  This laboratory is certified under the Clinical Laboratory Improvement Amendments CLIA as qualified to perform high complexity clinical laboratory testing.    GENTAMICIN Value in next row Sensitive      2 SENSITIVEThis is a modified FDA-approved test that has been validated and its performance characteristics determined by the reporting laboratory.  This laboratory is certified under the Clinical Laboratory Improvement Amendments CLIA as qualified to perform high complexity clinical laboratory testing.    NITROFURANTOIN  Value in next row Sensitive      2 SENSITIVEThis is a modified FDA-approved test that has been validated and its performance  characteristics determined by the reporting laboratory.  This laboratory is certified under the Clinical Laboratory Improvement Amendments CLIA as qualified to perform high complexity clinical laboratory testing.    TRIMETH /SULFA  Value in next row Sensitive      2 SENSITIVEThis is a modified FDA-approved test that has been validated and its performance characteristics determined by  the reporting laboratory.  This laboratory is certified under the Clinical Laboratory Improvement Amendments CLIA as qualified to perform high complexity clinical laboratory testing.    AMPICILLIN/SULBACTAM Value in next row Sensitive      2 SENSITIVEThis is a modified FDA-approved test that has been validated and its performance characteristics determined by the reporting laboratory.  This laboratory is certified under the Clinical Laboratory Improvement Amendments CLIA as qualified to perform high complexity clinical laboratory testing.    PIP/TAZO Value in next row Sensitive      <=4 SENSITIVEThis is a modified FDA-approved test that has been validated and its performance characteristics determined by the reporting laboratory.  This laboratory is certified under the Clinical Laboratory Improvement Amendments CLIA as qualified to perform high complexity clinical laboratory testing.    MEROPENEM Value in next row Sensitive      <=4 SENSITIVEThis is a modified FDA-approved test that has been validated and its performance characteristics determined by the reporting laboratory.  This laboratory is certified under the Clinical Laboratory Improvement Amendments CLIA as qualified to perform high complexity clinical laboratory testing.    * >=100,000 COLONIES/mL ESCHERICHIA COLI  Expectorated Sputum Assessment w Gram Stain, Rflx to Resp Cult     Status: None   Collection Time: 05/28/24  1:18 PM   Specimen: Salivary Gland; Sputum  Result Value Ref Range Status   Specimen Description SALIVA  Final   Special Requests EXPSU  Final    Sputum evaluation   Final    THIS SPECIMEN IS ACCEPTABLE FOR SPUTUM CULTURE Performed at [facility], [address] [place], [place] [address]    Report Status 05/29/2024 FINAL  Final  Culture, Respiratory w Gram Stain     Status: None   Collection Time: 05/28/24  1:18 PM  Result Value Ref Range Status   Specimen Description EXPECTORATED SPUTUM  Final   Special Requests Reflexed from [id number]  Final   Gram Stain   Final    FEW WBC PRESENT, PREDOMINANTLY PMN RARE GRAM NEGATIVE RODS Performed at [facility], [address] [place], [place] [address]    Culture RARE CANDIDA ALBICANS  Final   Report Status 06/01/2024 FINAL  Final  MRSA Next Gen by PCR, Nasal     Status: None   Collection Time: 05/28/24  9:40 PM   Specimen: Nasal Mucosa; Nasal Swab  Result Value Ref Range Status   MRSA by PCR Next Gen NOT DETECTED NOT DETECTED Final    Comment: (NOTE) The GeneXpert MRSA Assay (FDA approved for NASAL specimens only), is one component of a comprehensive MRSA colonization surveillance program. It is not intended to diagnose MRSA infection nor to guide or monitor treatment for MRSA infections. Test performance is not FDA approved in patients less than 39 years old. Performed at [facility], [address] [place], [place] [address]   Blood culture (routine x 2)     Status: None   Collection Time: 05/28/24 10:24 PM   Specimen: BLOOD  Result Value Ref Range Status   Specimen Description BLOOD BLOOD RIGHT HAND  Final   Special Requests   Final    BOTTLES DRAWN AEROBIC ONLY Blood Culture results may not be optimal due to an inadequate volume of blood received in culture bottles   Culture   Final    NO GROWTH 5 DAYS Performed at [facility], [address] [place], [place] [address]    Report Status 06/02/2024 FINAL  Final  Body  fluid culture w Gram Stain     Status: None   Collection Time: 05/29/24  8:00 AM   Specimen: Other Source; Body  Fluid  Result Value Ref Range Status   Specimen Description   Final    OTHER Performed at [facility], [address] [place], [place] [address]    Special Requests   Final    NONE Performed at [facility], [address] [place], [place] [address]    Gram Stain   Final    FEW WBC PRESENT, PREDOMINANTLY PMN MODERATE GRAM POSITIVE COCCI FEW GRAM NEGATIVE RODS Performed at [facility], [address] [place], [place] [address]    Culture   Final    ABUNDANT ENTEROCOCCUS FAECALIS ABUNDANT ESCHERICHIA COLI ABUNDANT CITROBACTER KOSERI FEW PROTEUS MIRABILIS    Report Status 06/03/2024 FINAL  Final   Organism ID, Bacteria ESCHERICHIA COLI  Final   Organism ID, Bacteria CITROBACTER KOSERI  Final   Organism ID, Bacteria PROTEUS MIRABILIS  Final   Organism ID, Bacteria ENTEROCOCCUS FAECALIS  Final      Susceptibility   Citrobacter koseri - MIC*    CEFEPIME  <=0.12 SENSITIVE Sensitive     ERTAPENEM <=0.12 SENSITIVE Sensitive     CEFTRIAXONE  <=0.25 SENSITIVE Sensitive     CIPROFLOXACIN  <=0.06 SENSITIVE Sensitive     GENTAMICIN <=1 SENSITIVE Sensitive     MEROPENEM <=0.25 SENSITIVE Sensitive     TRIMETH /SULFA  <=20 SENSITIVE Sensitive     PIP/TAZO Value in next row Sensitive      <=4 SENSITIVEThis is a modified FDA-approved test that has been validated and its performance characteristics determined by the reporting laboratory.  This laboratory is certified under the Clinical Laboratory Improvement Amendments CLIA as qualified to perform high complexity clinical laboratory testing.    * ABUNDANT CITROBACTER KOSERI   Escherichia coli - MIC*    AMPICILLIN Value in next row Sensitive      <=4 SENSITIVEThis is a modified FDA-approved test that has been validated and its performance characteristics determined by the reporting laboratory.  This laboratory is certified under the Clinical Laboratory Improvement Amendments CLIA as qualified to perform high complexity  clinical laboratory testing.    CEFAZOLIN  (NON-URINE) Value in next row Sensitive      <=4 SENSITIVEThis is a modified FDA-approved test that has been validated and its performance characteristics determined by the reporting laboratory.  This laboratory is certified under the Clinical Laboratory Improvement Amendments CLIA as qualified to perform high complexity clinical laboratory testing.    CEFEPIME  Value in next row Sensitive      <=4 SENSITIVEThis is a modified FDA-approved test that has been validated and its performance characteristics determined by the reporting laboratory.  This laboratory is certified under the Clinical Laboratory Improvement Amendments CLIA as qualified to perform high complexity clinical laboratory testing.    ERTAPENEM Value in next row Sensitive      <=4 SENSITIVEThis is a modified FDA-approved test that has been validated and its performance characteristics determined by the reporting laboratory.  This laboratory is certified under the Clinical Laboratory Improvement Amendments CLIA as qualified to perform high complexity clinical laboratory testing.    CEFTRIAXONE  Value in next row Sensitive      <=4 SENSITIVEThis is a modified FDA-approved test that has been validated and its performance characteristics determined by the reporting laboratory.  This laboratory is certified under the Clinical Laboratory Improvement Amendments CLIA as qualified to perform high complexity clinical laboratory testing.  CIPROFLOXACIN  Value in next row Sensitive      <=4 SENSITIVEThis is a modified FDA-approved test that has been validated and its performance characteristics determined by the reporting laboratory.  This laboratory is certified under the Clinical Laboratory Improvement Amendments CLIA as qualified to perform high complexity clinical laboratory testing.    GENTAMICIN Value in next row Sensitive      <=4 SENSITIVEThis is a modified FDA-approved test that has been validated and  its performance characteristics determined by the reporting laboratory.  This laboratory is certified under the Clinical Laboratory Improvement Amendments CLIA as qualified to perform high complexity clinical laboratory testing.    MEROPENEM Value in next row Sensitive      <=4 SENSITIVEThis is a modified FDA-approved test that has been validated and its performance characteristics determined by the reporting laboratory.  This laboratory is certified under the Clinical Laboratory Improvement Amendments CLIA as qualified to perform high complexity clinical laboratory testing.    TRIMETH /SULFA  Value in next row Sensitive      <=4 SENSITIVEThis is a modified FDA-approved test that has been validated and its performance characteristics determined by the reporting laboratory.  This laboratory is certified under the Clinical Laboratory Improvement Amendments CLIA as qualified to perform high complexity clinical laboratory testing.    AMPICILLIN/SULBACTAM Value in next row Sensitive      <=4 SENSITIVEThis is a modified FDA-approved test that has been validated and its performance characteristics determined by the reporting laboratory.  This laboratory is certified under the Clinical Laboratory Improvement Amendments CLIA as qualified to perform high complexity clinical laboratory testing.    PIP/TAZO Value in next row Sensitive      <=4 SENSITIVEThis is a modified FDA-approved test that has been validated and its performance characteristics determined by the reporting laboratory.  This laboratory is certified under the Clinical Laboratory Improvement Amendments CLIA as qualified to perform high complexity clinical laboratory testing.    * ABUNDANT ESCHERICHIA COLI   Enterococcus faecalis - MIC*    AMPICILLIN Value in next row Sensitive      <=4 SENSITIVEThis is a modified FDA-approved test that has been validated and its performance characteristics determined by the reporting laboratory.  This laboratory is  certified under the Clinical Laboratory Improvement Amendments CLIA as qualified to perform high complexity clinical laboratory testing.    VANCOMYCIN  Value in next row Sensitive      <=4 SENSITIVEThis is a modified FDA-approved test that has been validated and its performance characteristics determined by the reporting laboratory.  This laboratory is certified under the Clinical Laboratory Improvement Amendments CLIA as qualified to perform high complexity clinical laboratory testing.    GENTAMICIN SYNERGY Value in next row Sensitive      <=4 SENSITIVEThis is a modified FDA-approved test that has been validated and its performance characteristics determined by the reporting laboratory.  This laboratory is certified under the Clinical Laboratory Improvement Amendments CLIA as qualified to perform high complexity clinical laboratory testing.    * ABUNDANT ENTEROCOCCUS FAECALIS   Proteus mirabilis - MIC*    AMPICILLIN Value in next row Sensitive      <=4 SENSITIVEThis is a modified FDA-approved test that has been validated and its performance characteristics determined by the reporting laboratory.  This laboratory is certified under the Clinical Laboratory Improvement Amendments CLIA as qualified to perform high complexity clinical laboratory testing.    CEFAZOLIN  (NON-URINE) Value in next row Intermediate      <=4 SENSITIVEThis is a modified FDA-approved test that has  been validated and its performance characteristics determined by the reporting laboratory.  This laboratory is certified under the Clinical Laboratory Improvement Amendments CLIA as qualified to perform high complexity clinical laboratory testing.    CEFEPIME  Value in next row Sensitive      <=4 SENSITIVEThis is a modified FDA-approved test that has been validated and its performance characteristics determined by the reporting laboratory.  This laboratory is certified under the Clinical Laboratory Improvement Amendments CLIA as qualified to  perform high complexity clinical laboratory testing.    ERTAPENEM Value in next row Sensitive      <=4 SENSITIVEThis is a modified FDA-approved test that has been validated and its performance characteristics determined by the reporting laboratory.  This laboratory is certified under the Clinical Laboratory Improvement Amendments CLIA as qualified to perform high complexity clinical laboratory testing.    CEFTRIAXONE  Value in next row Sensitive      <=4 SENSITIVEThis is a modified FDA-approved test that has been validated and its performance characteristics determined by the reporting laboratory.  This laboratory is certified under the Clinical Laboratory Improvement Amendments CLIA as qualified to perform high complexity clinical laboratory testing.    CIPROFLOXACIN  Value in next row Sensitive      <=4 SENSITIVEThis is a modified FDA-approved test that has been validated and its performance characteristics determined by the reporting laboratory.  This laboratory is certified under the Clinical Laboratory Improvement Amendments CLIA as qualified to perform high complexity clinical laboratory testing.    GENTAMICIN Value in next row Sensitive      <=4 SENSITIVEThis is a modified FDA-approved test that has been validated and its performance characteristics determined by the reporting laboratory.  This laboratory is certified under the Clinical Laboratory Improvement Amendments CLIA as qualified to perform high complexity clinical laboratory testing.    MEROPENEM Value in next row Sensitive      <=4 SENSITIVEThis is a modified FDA-approved test that has been validated and its performance characteristics determined by the reporting laboratory.  This laboratory is certified under the Clinical Laboratory Improvement Amendments CLIA as qualified to perform high complexity clinical laboratory testing.    TRIMETH /SULFA  Value in next row Sensitive      <=4 SENSITIVEThis is a modified FDA-approved test that has  been validated and its performance characteristics determined by the reporting laboratory.  This laboratory is certified under the Clinical Laboratory Improvement Amendments CLIA as qualified to perform high complexity clinical laboratory testing.    AMPICILLIN/SULBACTAM Value in next row Sensitive      <=4 SENSITIVEThis is a modified FDA-approved test that has been validated and its performance characteristics determined by the reporting laboratory.  This laboratory is certified under the Clinical Laboratory Improvement Amendments CLIA as qualified to perform high complexity clinical laboratory testing.    PIP/TAZO Value in next row Sensitive      <=4 SENSITIVEThis is a modified FDA-approved test that has been validated and its performance characteristics determined by the reporting laboratory.  This laboratory is certified under the Clinical Laboratory Improvement Amendments CLIA as qualified to perform high complexity clinical laboratory testing.    * FEW PROTEUS MIRABILIS    Coagulation Studies: No results for input(s): LABPROT, INR in the last 72 hours.  Urinalysis: No results for input(s): COLORURINE, LABSPEC, PHURINE, GLUCOSEU, HGBUR, BILIRUBINUR, KETONESUR, PROTEINUR, UROBILINOGEN, NITRITE, LEUKOCYTESUR in the last 72 hours.  Invalid input(s): APPERANCEUR     Imaging: DG Abd 1 View Result Date: 06/03/2024 CLINICAL DATA:  Projectile vomiting EXAM: ABDOMEN - 1 VIEW  COMPARISON:  Two days ago FINDINGS: The bowel gas pattern is normal. Stable right-sided nephrostomy. Left-sided surgical clips are noted. IMPRESSION: No abnormal bowel dilatation. Electronically Signed   By: Lynwood Landy Raddle M.D.   On: 06/03/2024 16:40   IR NEPHROSTOMY EXCHANGE RIGHT Result Date: 06/02/2024 INDICATION: Chronic indwelling right nephrostomy, routine exchange EXAM: FLUOROSCOPIC EXCHANGE OF THE RIGHT 10 FRENCH NEPHROSTOMY COMPARISON:  None Available. MEDICATIONS: 1% lidocaine  local  ANESTHESIA/SEDATION: Moderate (conscious) sedation was employed during this procedure. A total of Versed  1.0 mg and Fentanyl  50 mcg was administered intravenously by the radiology nurse. Total intra-service moderate Sedation Time: 5 minutes. The patient's level of consciousness and vital signs were monitored continuously by radiology nursing throughout the procedure under my direct supervision. CONTRAST:  15 cc-administered into the collecting system(s) FLUOROSCOPY: Radiation Exposure Index (as provided by the fluoroscopic device): 5.0 mGy Kerma COMPLICATIONS: None immediate. PROCEDURE: Informed written consent was obtained from the patient after a thorough discussion of the procedural risks, benefits and alternatives. All questions were addressed. Maximal Sterile Barrier Technique was utilized including caps, mask, sterile gowns, sterile gloves, sterile drape, hand hygiene and skin antiseptic. A timeout was performed prior to the initiation of the procedure. Previous imaging reviewed. Preliminary injection confirms position of the nephrostomy within the renal pelvis. Successful exchange performed over an Amplatz guidewire. Retention loop formed in the renal pelvis. Contrast injection confirms position. Images again obtained for documentation. Patient tolerated the procedure well. No immediate complication. Catheter secured with a silk suture. Gravity drainage bag connected followed by sterile dressing. No immediate complication. Patient tolerated the procedure well. IMPRESSION: Successful fluoroscopic exchange of the right 10 French nephrostomy Electronically Signed   By: CHRISTELLA.  Shick M.D.   On: 06/02/2024 14:17      Medications:    sodium chloride  10 mL/hr at 06/02/24 1158   sodium chloride      amiodarone  30 mg/hr (06/04/24 0645)   cefTRIAXone  (ROCEPHIN )  IV      sodium chloride    Intravenous Once   atorvastatin   40 mg Oral Daily   calcium  acetate  1,334 mg Oral TID WC   Chlorhexidine  Gluconate Cloth   6 each Topical Q0600   epoetin  alfa-epbx (RETACRIT ) injection  10,000 Units Intravenous Q M,W,F-1800   feeding supplement (NEPRO CARB STEADY)  237 mL Oral TID BM   heparin   5,000 Units Subcutaneous Q8H   lactulose   30 g Oral Once   levothyroxine   50 mcg Oral Daily   lidocaine -prilocaine   1 Application Topical Once per day on Monday Wednesday Friday   midodrine   10 mg Oral TID WC   multivitamin  1 tablet Oral QHS   mouth rinse  15 mL Mouth Rinse 4 times per day   pantoprazole  (PROTONIX ) IV  40 mg Intravenous Q12H   sodium chloride  flush  3 mL Intravenous Q12H   acetaminophen  **OR** acetaminophen , diphenhydrAMINE , famotidine , HYDROmorphone  (DILAUDID ) injection, ipratropium-albuterol , methylPREDNISolone  (SOLU-MEDROL ) injection, midazolam , mouth rinse, mouth rinse, oxyCODONE   Assessment/ Plan:  [name] is a 54 y.o.  male with past medical conditions including tobacco abuse, anemia, hypertension, TBI, malnutrition, combined heart failure, cocaine abuse, bilateral nephrostomy tubes and end-stage renal disease on hemodialysis.  Patient presents to the emergency department for shortness of breath and has been admitted for Shortness of breath [R06.02] General weakness [R53.1] ESRD on dialysis (HCC) [N18.6, Z99.2] Nephrostomy complication [N99.528] Sepsis (HCC) [A41.9] Elevated procalcitonin [R79.89]    End-stage renal disease on hemodialysis. Receiving dialysis today, UF goal 0-0.5L as tolerated. Next treatment  scheduled for Friday  2.  Sepsis with E. Coli and Citrobacter species growing from urine.  With hypotension requiring vasopressors.  IR planning nephrostomy exchange this admission - ceftriaxone  and metronidazole    3. Anemia of chronic kidney disease Lab Results  Component Value Date   HGB 8.5 (L) 06/04/2024  Hgb remains decreased ESA with HD treatments  4. Secondary Hyperparathyroidism: with outpatient labs: PTH 438, phosphorus 5.9, calcium  8.3 on 04/28/24.   Lab  Results  Component Value Date   CALCIUM  8.5 (L) 06/04/2024   CAION 0.79 (LL) 09/23/2021   PHOS 4.2 06/04/2024    Patient prescribed calcitriol  and calcium  acetate outpatient.   5.  Hypotensive with history of hypertension with chronic kidney disease.  Home regimen includes carvedilol  and irbesartan  being held - weaned off vasopressor: norepinephrine .  - Continue midodrine .    LOS: 7 Merlen Gurry 1/14/202610:21 AM   "

## 2024-06-04 NOTE — Progress Notes (Addendum)
 "    Progress Note    Larry Morrison  FMW:[id number] DOB: 07/29/1970  DOA: 05/28/2024 PCP: [facility]      Brief Narrative:    Medical records reviewed and are as summarized below:  Larry Morrison is a 54 y.o. 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 He recently presented to [facility] ED on 12/30 due to weakness, nausea and vomiting.  There was some suspicion of more acute pancreatitis because of mildly elevated lipase but no specific etiology was identified.  He improved after arrival and was discharged back to the SNF where he resides.  He presented back to the ED on 05/28/2024 because of shortness of breath.  He was subsequently transferred to the ICU because of concern for septic shock secondary to complicated acute UTI in the setting of infected right nephrostomy tube.  He was started on IV vasopressors.  He was also treated with empiric IV antibiotics.  There was also concern for aspiration pneumonitis. Urine and body fluid grew E. coli, Citrobacter, Proteus and Enterococcus faecalis.  Right nephrostomy tube was exchanged by IR on 06/02/2024.  He was transferred to [facility] team on 06/03/2024.   Assessment/Plan:   Principal Problem:   Septic shock (HCC) Active Problems:   New onset a-fib (HCC)   Hypotension   HFrEF (heart failure with reduced ejection fraction) (HCC)   Nausea and vomiting   Anemia of chronic renal failure   ESRD on dialysis (HCC)   Hypertension   Protein calorie malnutrition   Goals of care, counseling/discussion   Hematemesis without nausea   Nephrostomy complication   General weakness   Complicated UTI (urinary tract infection)   Protein-calorie malnutrition, severe   Nutrition Problem: Severe  Malnutrition Etiology: chronic illness (ESRD on HD)  Signs/Symptoms: percent weight loss, moderate fat depletion, severe fat depletion, moderate muscle depletion, severe muscle depletion   Body mass index is 19 kg/m.   Septic shockSecondary to acute complicated UTI, infected right nephrostomy tube: Shock physiology has resolved.  S/p treatment with IV Levophed  infusion.  S/p exchange of right nephrostomy tube by IR on 06/02/2024.  Continue IV ceftriaxone . Follow-up with ID for further recommendations. Body fluid culture showed E. coli, Citrobacter koseri, Proteus mirabilis and Enterococcus faecalis. Urine culture showed E. coli, Citrobacter koseri. No growth on blood cultures.   New onset atrial fibrillation with RVR: He is on IV amiodarone  drip. Not on anticoagulation because of anemia and hematemesis.   Prolonged QTc interval: Repeat EKG on 06/04/2021 showed QTc interval of 604.  Continue to monitor and follow-up with cardiologist.   Hypotension: New midodrine .   This limits the use of rate control medications/antihypertensives.   Acute on chronic anemia, anemia of chronic disease: S/p transfusion with 2 units of PRBCs on 06/04/2024.  Monitor H&H. Hemoglobin ranging between 7.5 and 8.5.   Patient vomiting, hematemesis: Patient was scheduled for EGD on 06/04/2024 after hemodialysis.  However, he refused to be transported to the endoscopy suite. Continue IV Protonix .  Chronic HFrEF: Compensated.  Carvedilol  and irbesartan  on hold because of soft blood pressure.  EF less than 20%.   ESRD on HD: Follow-up with nephrologist for hemodialysis.   Hypokalemia: Potassium down to 3.2.  Hold off on repletion because of ESRD status.   Severe protein calorie malnutrition: Continue  nutritional supplements.  Follow-up with dietitian.   Chronic debility, poor functional status, history of TBI: Follow-up with palliative care team. Legal guardian with DSS team     Diet Order              Diet NPO time specified Except for: Sips with Meds  Diet effective midnight                                  Consultants: Gastroenterologist Nephrologist Interventional radiologist  Procedures: S/p right nephrostomy tube placement on 06/02/2024    Medications:    sodium chloride    Intravenous Once   atorvastatin   40 mg Oral Daily   calcium  acetate  1,334 mg Oral TID WC   Chlorhexidine  Gluconate Cloth  6 each Topical Q0600   epoetin  alfa-epbx (RETACRIT ) injection  10,000 Units Intravenous Q M,W,F-1800   feeding supplement (NEPRO CARB STEADY)  237 mL Oral TID BM   heparin   5,000 Units Subcutaneous Q8H   lactulose   30 g Oral Once   levothyroxine   50 mcg Oral Daily   lidocaine -prilocaine   1 Application Topical Once per day on Monday Wednesday Friday   midodrine   10 mg Oral TID WC   multivitamin  1 tablet Oral QHS   mouth rinse  15 mL Mouth Rinse 4 times per day   pantoprazole  (PROTONIX ) IV  40 mg Intravenous Q12H   sodium chloride  flush  3 mL Intravenous Q12H   Continuous Infusions:  sodium chloride  10 mL/hr at 06/02/24 1158   amiodarone  30 mg/hr (06/04/24 0645)   cefTRIAXone  (ROCEPHIN )  IV       Anti-infectives (From admission, onward)    Start     Dose/Rate Route Frequency Ordered Stop   06/04/24 1000  cefTRIAXone  (ROCEPHIN ) 2 g in sodium chloride  0.9 % 100 mL IVPB        2 g 200 mL/hr over 30 Minutes Intravenous Every 24 hours 06/03/24 2152     06/02/24 1200  vancomycin  (VANCOREADY) IVPB 500 mg/100 mL  Status:  Discontinued        500 mg 100 mL/hr over 60 Minutes Intravenous Every M-W-F (Hemodialysis) 05/31/24 1220 06/01/24 1042   06/01/24 2000  metroNIDAZOLE  (FLAGYL ) IVPB 500 mg  Status:  Discontinued        500 mg 100 mL/hr over 60 Minutes Intravenous Every 8 hours 06/01/24 1707 06/03/24 2152   05/31/24 1000  cefTRIAXone  (ROCEPHIN ) 1 g in sodium chloride  0.9 % 100 mL IVPB  Status:  Discontinued        1 g 200 mL/hr over 30 Minutes  Intravenous Every 24 hours 05/31/24 0834 06/03/24 2152   05/30/24 1800  vancomycin  (VANCOREADY) IVPB 500 mg/100 mL        500 mg 100 mL/hr over 60 Minutes Intravenous  Once 05/30/24 0934 05/30/24 1919   05/29/24 0912  ceFAZolin  (ANCEF ) IVPB 1 g/50 mL premix  Status:  Discontinued        1 g 100 mL/hr over 30 Minutes Intravenous 30 min pre-op  05/29/24 0912 05/31/24 0832   05/29/24 0600  piperacillin -tazobactam (ZOSYN ) IVPB 2.25 g  Status:  Discontinued        2.25 g 100 mL/hr over 30 Minutes Intravenous Every 8 hours 05/29/24 0446 05/31/24 0834   05/28/24 1600  vancomycin  (VANCOREADY) IVPB 1500 mg/300 mL        1,500 mg 150 mL/hr over 120 Minutes Intravenous Once 05/28/24 1410 05/28/24 2131  05/28/24 1330  vancomycin  (VANCOREADY) IVPB 1500 mg/300 mL  Status:  Discontinued        1,500 mg 150 mL/hr over 120 Minutes Intravenous Once 05/28/24 1302 05/28/24 1410   05/28/24 1320  vancomycin  variable dose per unstable renal function (pharmacist dosing)  Status:  Discontinued         Does not apply See admin instructions 05/28/24 1320 05/31/24 1220   05/28/24 1300  cefTRIAXone  (ROCEPHIN ) 2 g in sodium chloride  0.9 % 100 mL IVPB  Status:  Discontinued        2 g 200 mL/hr over 30 Minutes Intravenous Every 24 hours 05/28/24 1246 05/29/24 0525   05/28/24 1200  cefTRIAXone  (ROCEPHIN ) 1 g in sodium chloride  0.9 % 100 mL IVPB  Status:  Discontinued        1 g 200 mL/hr over 30 Minutes Intravenous  Once 05/28/24 1150 05/28/24 1254              Family Communication/Anticipated D/C date and plan/Code Status   DVT prophylaxis: heparin  injection 5,000 Units Start: 05/28/24 1400     Code Status: Full Code  Family Communication: None Disposition Plan: Plan to discharge to SNF   Status is: Inpatient Remains inpatient appropriate because: Severe sepsis from acute complicated UTI       Subjective:   Interval events noted.  He has no complaints.  Engineer, water had come to  transport patient to the endoscopy suite for EGD.  However, patient frequently refused transport and said he was not going to do the procedure.  Nurses and nurse technician at the bedside.  Objective:    Vitals:   06/04/24 1253 06/04/24 1259 06/04/24 1300 06/04/24 1302  BP: (!) 137/91 132/87 127/86 132/87  Pulse: 99 93 99 100  Resp: (!) 36  (!) 33 (!) 22  Temp: 97.8 F (36.6 C) 97.7 F (36.5 C) 97.7 F (36.5 C)   TempSrc: Oral Oral Oral   SpO2: 99% 100% 99% 99%  Weight:      Height:       No data found.   Intake/Output Summary (Last 24 hours) at 06/04/2024 1434 Last data filed at 06/04/2024 1302 Gross per 24 hour  Intake 697.65 ml  Output 1600 ml  Net -902.35 ml   Filed Weights   06/03/24 1753 06/04/24 0500 06/04/24 0810  Weight: 46.5 kg 46.2 kg 50.2 kg    Exam:  GEN: NAD SKIN: Warm and dry EYES: No pallor or icterus ENT: MMM CV: RRR PULM: CTA B ABD: soft, ND, NT, +BS CNS: AAO x 2 (person and place), non focal EXT: No edema or tenderness GU: Right nephrostomy tube       Data Reviewed:   I have personally reviewed following labs and imaging studies:  Labs: Labs show the following:   Basic Metabolic Panel: Recent Labs  Lab 05/29/24 0621 05/30/24 0330 05/31/24 0350 06/01/24 0323 06/02/24 0537 06/03/24 0551 06/04/24 0830  NA 137 137 134* 135 140 141 137  K 3.7 3.6 3.6 4.1 3.6 3.5 3.2*  CL 92* 91* 93* 93* 92* 94* 96*  CO2 20* 24 23 21* 23 20* 26  GLUCOSE 89 116* 88 60* 76 80 67*  BUN 31* 47* 20 32* 46* 59* 21*  CREATININE 6.59* 9.36* 5.46* 8.45* 11.50* 13.90* 7.04*  CALCIUM  9.2 8.9 8.7* 9.0 9.2 9.3 8.5*  MG 1.9 1.9  --  2.2 2.3 2.4  --   PHOS 5.5* 5.0* 3.7 5.3*  --  6.5* 4.2  GFR Estimated Creatinine Clearance: 8.4 mL/min (A) (by C-G formula based on SCr of 7.04 mg/dL (H)). Liver Function Tests: Recent Labs  Lab 05/30/24 0330 05/31/24 0350 06/01/24 0323 06/03/24 0551 06/04/24 0830  AST  --   --   --  24  --   ALT  --   --   --  24   --   ALKPHOS  --   --   --  67  --   BILITOT  --   --   --  0.4  --   PROT  --   --   --  7.9  --   ALBUMIN  3.1* 3.0* 2.9* 3.0* 2.7*   No results for input(s): LIPASE, AMYLASE in the last 168 hours. No results for input(s): AMMONIA in the last 168 hours. Coagulation profile No results for input(s): INR, PROTIME in the last 168 hours.  CBC: Recent Labs  Lab 05/31/24 0350 06/01/24 0323 06/02/24 0537 06/03/24 0551 06/03/24 1117 06/03/24 2344 06/04/24 0830  WBC 12.7* 14.1* 15.1* 10.7*  --   --  8.6  NEUTROABS 9.9*  --   --   --   --   --   --   HGB 8.1* 7.5* 8.1* 7.9* 8.5* 7.6* 8.5*  HCT 26.4* 24.1* 27.1* 25.6* 27.2* 24.5* 27.4*  MCV 103.1* 104.8* 107.1* 106.7*  --   --  98.9  PLT 200 195 220 261  --   --  207   Cardiac Enzymes: No results for input(s): CKTOTAL, CKMB, CKMBINDEX, TROPONINI in the last 168 hours. BNP (last 3 results) Recent Labs    05/29/24 0621  PROBNP 6,557.0*   CBG: Recent Labs  Lab 05/31/24 0735 06/01/24 0552 06/01/24 0623 06/01/24 0751 06/03/24 1021  GLUCAP 96 63* 127* 83 87   D-Dimer: No results for input(s): DDIMER in the last 72 hours. Hgb A1c: No results for input(s): HGBA1C in the last 72 hours. Lipid Profile: No results for input(s): CHOL, HDL, LDLCALC, TRIG, CHOLHDL, LDLDIRECT in the last 72 hours. Thyroid  function studies: Recent Labs    06/03/24 0605  TSH 5.050*  T4TOTAL 8.0   Anemia work up: No results for input(s): VITAMINB12, FOLATE, FERRITIN, TIBC, IRON, RETICCTPCT in the last 72 hours. Sepsis Labs: Recent Labs  Lab 05/29/24 0621 05/30/24 0330 05/31/24 0350 06/01/24 0323 06/02/24 0537 06/03/24 0551 06/03/24 1117 06/03/24 1550 06/04/24 0830  PROCALCITON 66.10  --  40.10  --   --   --   --   --   --   WBC 15.8*   < > 12.7* 14.1* 15.1* 10.7*  --   --  8.6  LATICACIDVEN 1.1  --   --   --   --   --  2.0* 0.9  --    < > = values in this interval not displayed.     Microbiology Recent Results (from the past 240 hours)  Resp panel by RT-PCR (RSV, Flu A&B, Covid) Anterior Nasal Swab     Status: None   Collection Time: 05/28/24 10:07 AM   Specimen: Anterior Nasal Swab  Result Value Ref Range Status   SARS Coronavirus 2 by RT PCR NEGATIVE NEGATIVE Final    Comment: (NOTE) SARS-CoV-2 target nucleic acids are NOT DETECTED.  The SARS-CoV-2 RNA is generally detectable in upper respiratory specimens during the acute phase of infection. The lowest concentration of SARS-CoV-2 viral copies this assay can detect is 138 copies/mL. A negative result does not preclude SARS-Cov-2 infection and should not be  used as the sole basis for treatment or other patient management decisions. A negative result may occur with  improper specimen collection/handling, submission of specimen other than nasopharyngeal swab, presence of viral mutation(s) within the areas targeted by this assay, and inadequate number of viral copies(<138 copies/mL). A negative result must be combined with clinical observations, patient history, and epidemiological information. The expected result is Negative.  Fact Sheet for Patients:  bloggercourse.com  Fact Sheet for Healthcare Providers:  seriousbroker.it  This test is no t yet approved or cleared by the [place]  FDA and  has been authorized for detection and/or diagnosis of SARS-CoV-2 by FDA under an Emergency Use Authorization (EUA). This EUA will remain  in effect (meaning this test can be used) for the duration of the COVID-19 declaration under Section 564(b)(1) of the Act, 21 U.S.C.section 360bbb-3(b)(1), unless the authorization is terminated  or revoked sooner.       Influenza A by PCR NEGATIVE NEGATIVE Final   Influenza B by PCR NEGATIVE NEGATIVE Final    Comment: (NOTE) The Xpert Xpress SARS-CoV-2/FLU/RSV plus assay is intended as an aid in the diagnosis of  influenza from Nasopharyngeal swab specimens and should not be used as a sole basis for treatment. Nasal washings and aspirates are unacceptable for Xpert Xpress SARS-CoV-2/FLU/RSV testing.  Fact Sheet for Patients: bloggercourse.com  Fact Sheet for Healthcare Providers: seriousbroker.it  This test is not yet approved or cleared by the [place]  FDA and has been authorized for detection and/or diagnosis of SARS-CoV-2 by FDA under an Emergency Use Authorization (EUA). This EUA will remain in effect (meaning this test can be used) for the duration of the COVID-19 declaration under Section 564(b)(1) of the Act, 21 U.S.C. section 360bbb-3(b)(1), unless the authorization is terminated or revoked.     Resp Syncytial Virus by PCR NEGATIVE NEGATIVE Final    Comment: (NOTE) Fact Sheet for Patients: bloggercourse.com  Fact Sheet for Healthcare Providers: seriousbroker.it  This test is not yet approved or cleared by the [place]  FDA and has been authorized for detection and/or diagnosis of SARS-CoV-2 by FDA under an Emergency Use Authorization (EUA). This EUA will remain in effect (meaning this test can be used) for the duration of the COVID-19 declaration under Section 564(b)(1) of the Act, 21 U.S.C. section 360bbb-3(b)(1), unless the authorization is terminated or revoked.  Performed at [facility], [address] [place], [place] [address]   Respiratory (~20 pathogens) panel by PCR     Status: None   Collection Time: 05/28/24 10:07 AM   Specimen: Nasopharyngeal Swab; Respiratory  Result Value Ref Range Status   Adenovirus NOT DETECTED NOT DETECTED Final   Coronavirus 229E NOT DETECTED NOT DETECTED Final    Comment: (NOTE) The Coronavirus on the Respiratory Panel, DOES NOT test for the novel  Coronavirus (2019 nCoV)    Coronavirus HKU1 NOT DETECTED NOT  DETECTED Final   Coronavirus NL63 NOT DETECTED NOT DETECTED Final   Coronavirus OC43 NOT DETECTED NOT DETECTED Final   Metapneumovirus NOT DETECTED NOT DETECTED Final   Rhinovirus / Enterovirus NOT DETECTED NOT DETECTED Final   Influenza A NOT DETECTED NOT DETECTED Final   Influenza B NOT DETECTED NOT DETECTED Final   Parainfluenza Virus 1 NOT DETECTED NOT DETECTED Final   Parainfluenza Virus 2 NOT DETECTED NOT DETECTED Final   Parainfluenza Virus 3 NOT DETECTED NOT DETECTED Final   Parainfluenza Virus 4 NOT DETECTED NOT DETECTED Final   Respiratory Syncytial Virus NOT DETECTED NOT DETECTED Final  Bordetella pertussis NOT DETECTED NOT DETECTED Final   Bordetella Parapertussis NOT DETECTED NOT DETECTED Final   Chlamydophila pneumoniae NOT DETECTED NOT DETECTED Final   Mycoplasma pneumoniae NOT DETECTED NOT DETECTED Final    Comment: Performed at [facility], [address] [place], [place] [address]  Blood culture (routine x 2)     Status: None   Collection Time: 05/28/24 12:52 PM   Specimen: BLOOD  Result Value Ref Range Status   Specimen Description BLOOD RIGHT ANTECUBITAL  Final   Special Requests   Final    BOTTLES DRAWN AEROBIC AND ANAEROBIC Blood Culture results may not be optimal due to an inadequate volume of blood received in culture bottles   Culture   Final    NO GROWTH 5 DAYS Performed at [facility], [address] [place], [place] [address]    Report Status 06/02/2024 FINAL  Final  Urine Culture     Status: Abnormal   Collection Time: 05/28/24 12:52 PM   Specimen: Urine, Catheterized  Result Value Ref Range Status   Specimen Description   Final    URINE, CATHETERIZED Performed at [facility], [address], [place], [place] [address]    Special Requests   Final    NONE Performed at [facility], [address], [place], [place] [address]    Culture (A)  Final    >=100,000 COLONIES/mL ESCHERICHIA COLI >=100,000  COLONIES/mL CITROBACTER KOSERI    Report Status 05/31/2024 FINAL  Final   Organism ID, Bacteria ESCHERICHIA COLI (A)  Final   Organism ID, Bacteria CITROBACTER KOSERI (A)  Final      Susceptibility   Citrobacter koseri - MIC*    CEFEPIME  <=0.12 SENSITIVE Sensitive     ERTAPENEM <=0.12 SENSITIVE Sensitive     CEFTRIAXONE  <=0.25 SENSITIVE Sensitive     CIPROFLOXACIN  <=0.06 SENSITIVE Sensitive     GENTAMICIN <=1 SENSITIVE Sensitive     NITROFURANTOIN  32 SENSITIVE Sensitive     TRIMETH /SULFA  <=20 SENSITIVE Sensitive     PIP/TAZO Value in next row Sensitive      <=4 SENSITIVEThis is a modified FDA-approved test that has been validated and its performance characteristics determined by the reporting laboratory.  This laboratory is certified under the Clinical Laboratory Improvement Amendments CLIA as qualified to perform high complexity clinical laboratory testing.    MEROPENEM Value in next row Sensitive      <=4 SENSITIVEThis is a modified FDA-approved test that has been validated and its performance characteristics determined by the reporting laboratory.  This laboratory is certified under the Clinical Laboratory Improvement Amendments CLIA as qualified to perform high complexity clinical laboratory testing.    * >=100,000 COLONIES/mL CITROBACTER KOSERI   Escherichia coli - MIC*    AMPICILLIN Value in next row Sensitive      <=4 SENSITIVEThis is a modified FDA-approved test that has been validated and its performance characteristics determined by the reporting laboratory.  This laboratory is certified under the Clinical Laboratory Improvement Amendments CLIA as qualified to perform high complexity clinical laboratory testing.    CEFAZOLIN  (URINE) Value in next row Sensitive      2 SENSITIVEThis is a modified FDA-approved test that has been validated and its performance characteristics determined by the reporting laboratory.  This laboratory is certified under the Clinical Laboratory Improvement  Amendments CLIA as qualified to perform high complexity clinical laboratory testing.    CEFEPIME  Value in next row Sensitive      2 SENSITIVEThis is  a modified FDA-approved test that has been validated and its performance characteristics determined by the reporting laboratory.  This laboratory is certified under the Clinical Laboratory Improvement Amendments CLIA as qualified to perform high complexity clinical laboratory testing.    ERTAPENEM Value in next row Sensitive      2 SENSITIVEThis is a modified FDA-approved test that has been validated and its performance characteristics determined by the reporting laboratory.  This laboratory is certified under the Clinical Laboratory Improvement Amendments CLIA as qualified to perform high complexity clinical laboratory testing.    CEFTRIAXONE  Value in next row Sensitive      2 SENSITIVEThis is a modified FDA-approved test that has been validated and its performance characteristics determined by the reporting laboratory.  This laboratory is certified under the Clinical Laboratory Improvement Amendments CLIA as qualified to perform high complexity clinical laboratory testing.    CIPROFLOXACIN  Value in next row Sensitive      2 SENSITIVEThis is a modified FDA-approved test that has been validated and its performance characteristics determined by the reporting laboratory.  This laboratory is certified under the Clinical Laboratory Improvement Amendments CLIA as qualified to perform high complexity clinical laboratory testing.    GENTAMICIN Value in next row Sensitive      2 SENSITIVEThis is a modified FDA-approved test that has been validated and its performance characteristics determined by the reporting laboratory.  This laboratory is certified under the Clinical Laboratory Improvement Amendments CLIA as qualified to perform high complexity clinical laboratory testing.    NITROFURANTOIN  Value in next row Sensitive      2 SENSITIVEThis is a modified  FDA-approved test that has been validated and its performance characteristics determined by the reporting laboratory.  This laboratory is certified under the Clinical Laboratory Improvement Amendments CLIA as qualified to perform high complexity clinical laboratory testing.    TRIMETH /SULFA  Value in next row Sensitive      2 SENSITIVEThis is a modified FDA-approved test that has been validated and its performance characteristics determined by the reporting laboratory.  This laboratory is certified under the Clinical Laboratory Improvement Amendments CLIA as qualified to perform high complexity clinical laboratory testing.    AMPICILLIN/SULBACTAM Value in next row Sensitive      2 SENSITIVEThis is a modified FDA-approved test that has been validated and its performance characteristics determined by the reporting laboratory.  This laboratory is certified under the Clinical Laboratory Improvement Amendments CLIA as qualified to perform high complexity clinical laboratory testing.    PIP/TAZO Value in next row Sensitive      <=4 SENSITIVEThis is a modified FDA-approved test that has been validated and its performance characteristics determined by the reporting laboratory.  This laboratory is certified under the Clinical Laboratory Improvement Amendments CLIA as qualified to perform high complexity clinical laboratory testing.    MEROPENEM Value in next row Sensitive      <=4 SENSITIVEThis is a modified FDA-approved test that has been validated and its performance characteristics determined by the reporting laboratory.  This laboratory is certified under the Clinical Laboratory Improvement Amendments CLIA as qualified to perform high complexity clinical laboratory testing.    * >=100,000 COLONIES/mL ESCHERICHIA COLI  Expectorated Sputum Assessment w Gram Stain, Rflx to Resp Cult     Status: None   Collection Time: 05/28/24  1:18 PM   Specimen: Salivary Gland; Sputum  Result Value Ref Range Status   Specimen  Description SALIVA  Final   Special Requests EXPSU  Final   Sputum evaluation  Final    THIS SPECIMEN IS ACCEPTABLE FOR SPUTUM CULTURE Performed at [facility], [address] [place], [place] [address]    Report Status 05/29/2024 FINAL  Final  Culture, Respiratory w Gram Stain     Status: None   Collection Time: 05/28/24  1:18 PM  Result Value Ref Range Status   Specimen Description EXPECTORATED SPUTUM  Final   Special Requests Reflexed from [id number]  Final   Gram Stain   Final    FEW WBC PRESENT, PREDOMINANTLY PMN RARE GRAM NEGATIVE RODS Performed at [facility], [address] [place], [place] [address]    Culture RARE CANDIDA ALBICANS  Final   Report Status 06/01/2024 FINAL  Final  MRSA Next Gen by PCR, Nasal     Status: None   Collection Time: 05/28/24  9:40 PM   Specimen: Nasal Mucosa; Nasal Swab  Result Value Ref Range Status   MRSA by PCR Next Gen NOT DETECTED NOT DETECTED Final    Comment: (NOTE) The GeneXpert MRSA Assay (FDA approved for NASAL specimens only), is one component of a comprehensive MRSA colonization surveillance program. It is not intended to diagnose MRSA infection nor to guide or monitor treatment for MRSA infections. Test performance is not FDA approved in patients less than 16 years old. Performed at [facility], [address] [place], [place] [address]   Blood culture (routine x 2)     Status: None   Collection Time: 05/28/24 10:24 PM   Specimen: BLOOD  Result Value Ref Range Status   Specimen Description BLOOD BLOOD RIGHT HAND  Final   Special Requests   Final    BOTTLES DRAWN AEROBIC ONLY Blood Culture results may not be optimal due to an inadequate volume of blood received in culture bottles   Culture   Final    NO GROWTH 5 DAYS Performed at [facility], [address], [place], [place] [address]    Report Status 06/02/2024 FINAL  Final  Body fluid culture w Gram Stain     Status: None    Collection Time: 05/29/24  8:00 AM   Specimen: Other Source; Body Fluid  Result Value Ref Range Status   Specimen Description   Final    OTHER Performed at [facility], [address] [place], [place] [address]    Special Requests   Final    NONE Performed at [facility], [address] [place], [place] [address]    Gram Stain   Final    FEW WBC PRESENT, PREDOMINANTLY PMN MODERATE GRAM POSITIVE COCCI FEW GRAM NEGATIVE RODS Performed at [facility], [address] [place], [place] [address]    Culture   Final    ABUNDANT ENTEROCOCCUS FAECALIS ABUNDANT ESCHERICHIA COLI ABUNDANT CITROBACTER KOSERI FEW PROTEUS MIRABILIS    Report Status 06/03/2024 FINAL  Final   Organism ID, Bacteria ESCHERICHIA COLI  Final   Organism ID, Bacteria CITROBACTER KOSERI  Final   Organism ID, Bacteria PROTEUS MIRABILIS  Final   Organism ID, Bacteria ENTEROCOCCUS FAECALIS  Final      Susceptibility   Citrobacter koseri - MIC*    CEFEPIME  <=0.12 SENSITIVE Sensitive     ERTAPENEM <=0.12 SENSITIVE Sensitive     CEFTRIAXONE  <=0.25 SENSITIVE Sensitive     CIPROFLOXACIN  <=0.06 SENSITIVE Sensitive     GENTAMICIN <=1 SENSITIVE Sensitive     MEROPENEM <=0.25 SENSITIVE Sensitive     TRIMETH /SULFA  <=20 SENSITIVE Sensitive  PIP/TAZO Value in next row Sensitive      <=4 SENSITIVEThis is a modified FDA-approved test that has been validated and its performance characteristics determined by the reporting laboratory.  This laboratory is certified under the Clinical Laboratory Improvement Amendments CLIA as qualified to perform high complexity clinical laboratory testing.    * ABUNDANT CITROBACTER KOSERI   Escherichia coli - MIC*    AMPICILLIN Value in next row Sensitive      <=4 SENSITIVEThis is a modified FDA-approved test that has been validated and its performance characteristics determined by the reporting laboratory.  This laboratory is certified under the Clinical Laboratory  Improvement Amendments CLIA as qualified to perform high complexity clinical laboratory testing.    CEFAZOLIN  (NON-URINE) Value in next row Sensitive      <=4 SENSITIVEThis is a modified FDA-approved test that has been validated and its performance characteristics determined by the reporting laboratory.  This laboratory is certified under the Clinical Laboratory Improvement Amendments CLIA as qualified to perform high complexity clinical laboratory testing.    CEFEPIME  Value in next row Sensitive      <=4 SENSITIVEThis is a modified FDA-approved test that has been validated and its performance characteristics determined by the reporting laboratory.  This laboratory is certified under the Clinical Laboratory Improvement Amendments CLIA as qualified to perform high complexity clinical laboratory testing.    ERTAPENEM Value in next row Sensitive      <=4 SENSITIVEThis is a modified FDA-approved test that has been validated and its performance characteristics determined by the reporting laboratory.  This laboratory is certified under the Clinical Laboratory Improvement Amendments CLIA as qualified to perform high complexity clinical laboratory testing.    CEFTRIAXONE  Value in next row Sensitive      <=4 SENSITIVEThis is a modified FDA-approved test that has been validated and its performance characteristics determined by the reporting laboratory.  This laboratory is certified under the Clinical Laboratory Improvement Amendments CLIA as qualified to perform high complexity clinical laboratory testing.    CIPROFLOXACIN  Value in next row Sensitive      <=4 SENSITIVEThis is a modified FDA-approved test that has been validated and its performance characteristics determined by the reporting laboratory.  This laboratory is certified under the Clinical Laboratory Improvement Amendments CLIA as qualified to perform high complexity clinical laboratory testing.    GENTAMICIN Value in next row Sensitive      <=4  SENSITIVEThis is a modified FDA-approved test that has been validated and its performance characteristics determined by the reporting laboratory.  This laboratory is certified under the Clinical Laboratory Improvement Amendments CLIA as qualified to perform high complexity clinical laboratory testing.    MEROPENEM Value in next row Sensitive      <=4 SENSITIVEThis is a modified FDA-approved test that has been validated and its performance characteristics determined by the reporting laboratory.  This laboratory is certified under the Clinical Laboratory Improvement Amendments CLIA as qualified to perform high complexity clinical laboratory testing.    TRIMETH /SULFA  Value in next row Sensitive      <=4 SENSITIVEThis is a modified FDA-approved test that has been validated and its performance characteristics determined by the reporting laboratory.  This laboratory is certified under the Clinical Laboratory Improvement Amendments CLIA as qualified to perform high complexity clinical laboratory testing.    AMPICILLIN/SULBACTAM Value in next row Sensitive      <=4 SENSITIVEThis is a modified FDA-approved test that has been validated and its performance characteristics determined by the reporting laboratory.  This laboratory  is certified under the Clinical Laboratory Improvement Amendments CLIA as qualified to perform high complexity clinical laboratory testing.    PIP/TAZO Value in next row Sensitive      <=4 SENSITIVEThis is a modified FDA-approved test that has been validated and its performance characteristics determined by the reporting laboratory.  This laboratory is certified under the Clinical Laboratory Improvement Amendments CLIA as qualified to perform high complexity clinical laboratory testing.    * ABUNDANT ESCHERICHIA COLI   Enterococcus faecalis - MIC*    AMPICILLIN Value in next row Sensitive      <=4 SENSITIVEThis is a modified FDA-approved test that has been validated and its performance  characteristics determined by the reporting laboratory.  This laboratory is certified under the Clinical Laboratory Improvement Amendments CLIA as qualified to perform high complexity clinical laboratory testing.    VANCOMYCIN  Value in next row Sensitive      <=4 SENSITIVEThis is a modified FDA-approved test that has been validated and its performance characteristics determined by the reporting laboratory.  This laboratory is certified under the Clinical Laboratory Improvement Amendments CLIA as qualified to perform high complexity clinical laboratory testing.    GENTAMICIN SYNERGY Value in next row Sensitive      <=4 SENSITIVEThis is a modified FDA-approved test that has been validated and its performance characteristics determined by the reporting laboratory.  This laboratory is certified under the Clinical Laboratory Improvement Amendments CLIA as qualified to perform high complexity clinical laboratory testing.    * ABUNDANT ENTEROCOCCUS FAECALIS   Proteus mirabilis - MIC*    AMPICILLIN Value in next row Sensitive      <=4 SENSITIVEThis is a modified FDA-approved test that has been validated and its performance characteristics determined by the reporting laboratory.  This laboratory is certified under the Clinical Laboratory Improvement Amendments CLIA as qualified to perform high complexity clinical laboratory testing.    CEFAZOLIN  (NON-URINE) Value in next row Intermediate      <=4 SENSITIVEThis is a modified FDA-approved test that has been validated and its performance characteristics determined by the reporting laboratory.  This laboratory is certified under the Clinical Laboratory Improvement Amendments CLIA as qualified to perform high complexity clinical laboratory testing.    CEFEPIME  Value in next row Sensitive      <=4 SENSITIVEThis is a modified FDA-approved test that has been validated and its performance characteristics determined by the reporting laboratory.  This laboratory is  certified under the Clinical Laboratory Improvement Amendments CLIA as qualified to perform high complexity clinical laboratory testing.    ERTAPENEM Value in next row Sensitive      <=4 SENSITIVEThis is a modified FDA-approved test that has been validated and its performance characteristics determined by the reporting laboratory.  This laboratory is certified under the Clinical Laboratory Improvement Amendments CLIA as qualified to perform high complexity clinical laboratory testing.    CEFTRIAXONE  Value in next row Sensitive      <=4 SENSITIVEThis is a modified FDA-approved test that has been validated and its performance characteristics determined by the reporting laboratory.  This laboratory is certified under the Clinical Laboratory Improvement Amendments CLIA as qualified to perform high complexity clinical laboratory testing.    CIPROFLOXACIN  Value in next row Sensitive      <=4 SENSITIVEThis is a modified FDA-approved test that has been validated and its performance characteristics determined by the reporting laboratory.  This laboratory is certified under the Clinical Laboratory Improvement Amendments CLIA as qualified to perform high complexity clinical laboratory testing.  GENTAMICIN Value in next row Sensitive      <=4 SENSITIVEThis is a modified FDA-approved test that has been validated and its performance characteristics determined by the reporting laboratory.  This laboratory is certified under the Clinical Laboratory Improvement Amendments CLIA as qualified to perform high complexity clinical laboratory testing.    MEROPENEM Value in next row Sensitive      <=4 SENSITIVEThis is a modified FDA-approved test that has been validated and its performance characteristics determined by the reporting laboratory.  This laboratory is certified under the Clinical Laboratory Improvement Amendments CLIA as qualified to perform high complexity clinical laboratory testing.    TRIMETH /SULFA  Value in  next row Sensitive      <=4 SENSITIVEThis is a modified FDA-approved test that has been validated and its performance characteristics determined by the reporting laboratory.  This laboratory is certified under the Clinical Laboratory Improvement Amendments CLIA as qualified to perform high complexity clinical laboratory testing.    AMPICILLIN/SULBACTAM Value in next row Sensitive      <=4 SENSITIVEThis is a modified FDA-approved test that has been validated and its performance characteristics determined by the reporting laboratory.  This laboratory is certified under the Clinical Laboratory Improvement Amendments CLIA as qualified to perform high complexity clinical laboratory testing.    PIP/TAZO Value in next row Sensitive      <=4 SENSITIVEThis is a modified FDA-approved test that has been validated and its performance characteristics determined by the reporting laboratory.  This laboratory is certified under the Clinical Laboratory Improvement Amendments CLIA as qualified to perform high complexity clinical laboratory testing.    * FEW PROTEUS MIRABILIS    Procedures and diagnostic studies:  DG Abd 1 View Result Date: 06/03/2024 CLINICAL DATA:  Projectile vomiting EXAM: ABDOMEN - 1 VIEW COMPARISON:  Two days ago FINDINGS: The bowel gas pattern is normal. Stable right-sided nephrostomy. Left-sided surgical clips are noted. IMPRESSION: No abnormal bowel dilatation. Electronically Signed   By: Lynwood Landy Raddle M.D.   On: 06/03/2024 16:40               LOS: 7 days   Nylani Michetti  [facility]   Pager on [email or web address]. If 7PM-7AM, please contact night-coverage at [email or web address]     06/04/2024, 2:34 PM           "

## 2024-06-04 NOTE — Progress Notes (Signed)
"  °  Progress Note  Patient Name: Larry Morrison Date of Encounter: 06/04/2024 [facility] Cardiologist: Redell Cave, MD   Interval Summary    Patient was seen at HD. He is in NSR on IV amiodarone . He required transfusion due to Hgb 7.6.   Vital Signs Vitals:   06/04/24 1100 06/04/24 1130 06/04/24 1215 06/04/24 1223  BP: 116/76 108/69 109/83 116/76  Pulse: 84 94 88   Resp: (!) 27 (!) 32 (!) 23 (!) 26  Temp:   98 F (36.7 C)   TempSrc:   Oral   SpO2: 100% 100% 100% 100%  Weight:      Height:        Intake/Output Summary (Last 24 hours) at 06/04/2024 1236 Last data filed at 06/04/2024 0646 Gross per 24 hour  Intake 318 ml  Output 600 ml  Net -282 ml      06/04/2024    8:10 AM 06/04/2024    5:00 AM 06/03/2024    5:53 PM  Last 3 Weights  Weight (lbs) 110 lb 10.7 oz 101 lb 13.6 oz 102 lb 8.2 oz  Weight (kg) 50.2 kg 46.2 kg 46.5 kg      Telemetry/ECG  NSR HR 70s - Personally Reviewed  Physical Exam  GEN: No acute distress.   Neck: No JVD Cardiac: RRR, no murmurs, rubs, or gallops.  Respiratory: Clear to auscultation bilaterally. GI: Soft, nontender, non-distended  MS: No edema  Assessment & Plan   Afib RVR - he was admitted 1/7 from SNF with sepsis/bacteremia. He developed Afib 1/13 overnight and was seen by cards. This is a new dx.  - treatment is limited given hypotension and ESRD and qt prolongation at baseline - patient appeared to be in and out of afib. Later in the AM rapid response called for Afib RVR started on IV amiodarone  - EKG today to check qtc - CHADSVASC at least 2, however reluctant to initiate a/c due to anemia and emesis requiring transfusion - keep Mag>2 and K>4  Chronic HFrEF - volume management per HD - soft pressures preclude GDMT  Sepsis 2/2 E coli bacteremia - BP remains soft, likely exacerbated by Afib.   Anemia emesis - Hgb 7.6 and 2 units transfused - daily CBC - GI following, may need colonoscopy/EGD    For  questions or updates, please contact [facility] Please consult [email or web address] for contact info under         Signed, Trudy Kory VEAR Fishman, PA-C   "
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
## 2024-06-04 NOTE — Progress Notes (Signed)
 Provided Cena, RN report. She will be taking over care.

## 2024-06-04 NOTE — Progress Notes (Signed)
 SLP Cancellation Note  Patient Details Name: Larry Morrison MRN: [id number] DOB: July 11, 1970   Cancelled treatment:       Reason Eval/Treat Not Completed:  (chart reviewed)  Per GI/MD, pt refused his scheduled EGD(to help determine cause of the Vomiting); he has also refused Medications w/ NSG.  Updated MDs that Speech services would not recommend any specific diet consistency at this time in setting of GI concern, but that pt is Missing Dentition for effective mastication.  Noted MD ordered a mech soft diet for pt. Recommend general aspiration precautions w/ any oral intake as posted in chart for NSG. ST services will s/o at this time w/ MD to reconsult if new swallowing needs while admitted.      Comer Portugal, MS, CCC-SLP Speech Language Pathologist Rehab Services; [facility] [phone] (ascom) Cai Flott 06/04/2024, 4:47 PM

## 2024-06-04 NOTE — Progress Notes (Signed)
 Hemodialysis Note:  Received patient in bed to unit. Alert and oriented. Informed consent singed and in chart.  Treatment initiated: 0840 Treatment completed: 1300  Access used: Left internal jugular catheter Access issues: None  1 unit of blood was given during dialysis. No transfusion reactions noted. Patient tolerated well. Transported back to room, alert without acute distress. Report given to patient's RN.  Total UF removed: 1 liter Medications given: Retacrit  10000 units IV  Post HD weight: 49.2 Kg  [facility]

## 2024-06-04 NOTE — Progress Notes (Signed)
 Patient off unit to dialysis.

## 2024-06-04 NOTE — Progress Notes (Signed)
 "  Date of Admission:  05/28/2024     ID: Larry Morrison is a 54 y.o. male  Principal Problem:   Septic shock (HCC) Active Problems:   Hypertension   Protein calorie malnutrition   ESRD on dialysis (HCC)   HFrEF (heart failure with reduced ejection fraction) (HCC)   New onset a-fib (HCC)   Hypotension   Nausea and vomiting   Anemia of chronic renal failure   Goals of care, counseling/discussion   Hematemesis without nausea   Nephrostomy complication   General weakness   Complicated UTI (urinary tract infection)   Protein-calorie malnutrition, severe  Larry Morrison is a 54 y.o. 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, left orchidopexy in 2018 Presented on 1/7 with increasing sob and worsening cough Was admitted to ICU with sepsis source being nephrostomy tube, rt kidney infection VS dialsys scatheter  Subjective: Doing okay Says no issues  Medications:   sodium chloride    Intravenous Once   atorvastatin   40 mg Oral Daily   calcium  acetate  1,334 mg Oral TID WC   Chlorhexidine  Gluconate Cloth  6 each Topical Q0600   epoetin  alfa-epbx (RETACRIT ) injection  10,000 Units Intravenous Q M,W,F-1800   feeding supplement (NEPRO CARB STEADY)  237 mL Oral TID BM   heparin   5,000 Units Subcutaneous Q8H   lactulose   30 g Oral Once   levothyroxine   50 mcg Oral Daily   lidocaine -prilocaine   1 Application Topical Once per day on Monday Wednesday Friday   midodrine   10 mg Oral TID WC   multivitamin  1 tablet Oral QHS   mouth rinse  15 mL Mouth Rinse 4 times per day   pantoprazole  (PROTONIX ) IV  40 mg Intravenous Q12H   sodium chloride  flush  3 mL Intravenous Q12H    Objective: Vital signs in last 24 hours: Patient Vitals for the past 24 hrs:  BP Temp Temp src Pulse Resp SpO2 Weight  06/04/24 1030 112/74 -- -- 86 (!) 26 98 % --   06/04/24 1000 125/81 -- -- 98 (!) 28 99 % --  06/04/24 0930 123/79 -- -- 72 (!) 24 100 % --  06/04/24 0900 119/87 -- -- 69 (!) 25 100 % --  06/04/24 0840 132/89 -- -- 68 (!) 21 100 % --  06/04/24 0810 123/80 98 F (36.7 C) Oral 84 20 100 % 50.2 kg  06/04/24 0741 -- -- -- 66 19 100 % --  06/04/24 0700 -- -- -- 61 16 100 % --  06/04/24 0650 121/79 98 F (36.7 C) Oral 71 16 100 % --  06/04/24 0600 106/66 -- -- 66 20 100 % --  06/04/24 0500 -- -- -- -- -- -- 46.2 kg  06/04/24 0409 (!) 105/55 98.2 F (36.8 C) Oral 69 17 96 % --  06/04/24 0335 114/67 97.8 F (36.6 C) Oral -- 19 98 % --  06/04/24 0020 123/60 98.1 F (36.7 C) Oral 88 -- 98 % --  06/04/24 0000 (!) 101/55 -- -- -- -- -- --  06/03/24 2010 -- 97.8 F (36.6 C) -- -- -- -- --  06/03/24 2005 111/64 -- -- 81 20 96 % --  06/03/24 1854 122/78 -- -- -- 19 100 % --  06/03/24 1800 120/69 -- -- 83 18 100 % --  06/03/24 1753 -- -- -- 88 20 100 % 46.5 kg  06/03/24 1750  116/71 98.9 F (37.2 C) Oral 87 (!) 43 100 % --  06/03/24 1530 108/75 -- -- 89 20 100 % --  06/03/24 1500 109/78 -- -- 80 (!) 21 100 % --  06/03/24 1435 108/76 -- -- 73 16 100 % --  06/03/24 1424 110/73 97.7 F (36.5 C) Oral 74 -- 100 % 47.1 kg  06/03/24 1300 113/74 -- -- -- 14 -- --  06/03/24 1256 -- 97.8 F (36.6 C) -- -- -- 100 % --     Lines and Device Date on insertion # of days DC  Engineer, Technical Sales     ETT       PHYSICAL EXAM:  General: Alert, cooperative, no distress, appears stated age.  Lungs: Clear to auscultation bilaterally. No Wheezing or Rhonchi. No rales. Heart: Regular rate and rhythm, no murmur, rub or gallop. Abdomen: Soft,rt nephrostomy Extremities: atraumatic, no cyanosis. No edema. No clubbing Skin: No rashes or lesions. Or bruising Lymph: Cervical, supraclavicular normal. Neurologic:did not examine in detail  Lab Results    Latest Ref Rng & Units 06/04/2024    8:30 AM 06/03/2024   11:44 PM 06/03/2024   11:17  AM  CBC  WBC 4.0 - 10.5 K/uL 8.6     Hemoglobin 13.0 - 17.0 g/dL 8.5  7.6  8.5   Hematocrit 39.0 - 52.0 % 27.4  24.5  27.2   Platelets 150 - 400 K/uL 207          Latest Ref Rng & Units 06/04/2024    8:30 AM 06/03/2024    5:51 AM 06/02/2024    5:37 AM  CMP  Glucose 70 - 99 mg/dL 67  80  76   BUN 6 - 20 mg/dL 21  59  46   Creatinine 0.61 - 1.24 mg/dL 2.95  86.09  88.49   Sodium 135 - 145 mmol/L 137  141  140   Potassium 3.5 - 5.1 mmol/L 3.2  3.5  3.6   Chloride 98 - 111 mmol/L 96  94  92   CO2 22 - 32 mmol/L 26  20  23    Calcium  8.9 - 10.3 mg/dL 8.5  9.3  9.2   Total Protein 6.5 - 8.1 g/dL  7.9    Total Bilirubin 0.0 - 1.2 mg/dL  0.4    Alkaline Phos 38 - 126 U/L  67    AST 15 - 41 U/L  24    ALT 0 - 44 U/L  24        Microbiology: 05/28/24 BC NG UC citrobacter and ecoli  05/29/24 Citrobacter, ecoli and proteus  Studies/Results: DG Abd 1 View Result Date: 06/03/2024 CLINICAL DATA:  Projectile vomiting EXAM: ABDOMEN - 1 VIEW COMPARISON:  Two days ago FINDINGS: The bowel gas pattern is normal. Stable right-sided nephrostomy. Left-sided surgical clips are noted. IMPRESSION: No abnormal bowel dilatation. Electronically Signed   By: Lynwood Landy Raddle M.D.   On: 06/03/2024 16:40   IR NEPHROSTOMY EXCHANGE RIGHT Result Date: 06/02/2024 INDICATION: Chronic indwelling right nephrostomy, routine exchange EXAM: FLUOROSCOPIC EXCHANGE OF THE RIGHT 10 FRENCH NEPHROSTOMY COMPARISON:  None Available. MEDICATIONS: 1% lidocaine  local ANESTHESIA/SEDATION: Moderate (conscious) sedation was employed during this procedure. A total of Versed  1.0 mg and Fentanyl  50 mcg was administered intravenously by the radiology nurse. Total intra-service moderate Sedation Time: 5 minutes. The patient's level of consciousness and vital signs were monitored continuously by radiology nursing throughout the procedure under my direct supervision. CONTRAST:  15 cc-administered into the collecting system(s) FLUOROSCOPY: Radiation  Exposure Index (as provided by the fluoroscopic device): 5.0 mGy Kerma COMPLICATIONS: None immediate. PROCEDURE: Informed written consent was obtained from the patient after a thorough discussion of the procedural risks, benefits and alternatives. All questions were addressed. Maximal Sterile Barrier Technique was utilized including caps, mask, sterile gowns, sterile gloves, sterile drape, hand hygiene and skin antiseptic. A timeout was performed prior to the initiation of the procedure. Previous imaging reviewed. Preliminary injection confirms position of the nephrostomy within the renal pelvis. Successful exchange performed over an Amplatz guidewire. Retention loop formed in the renal pelvis. Contrast injection confirms position. Images again obtained for documentation. Patient tolerated the procedure well. No immediate complication. Catheter secured with a silk suture. Gravity drainage bag connected followed by sterile dressing. No immediate complication. Patient tolerated the procedure well. IMPRESSION: Successful fluoroscopic exchange of the right 10 French nephrostomy Electronically Signed   By: CHRISTELLA.  Shick M.D.   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, left orchidopexy in 2018 Presented on 1/7 with increasing sob and worsening cough   Sepsis secondary to complicated UTI due to infected nephrostomy Culture had multiple organisms including citrobacter, ecoli and proteus- These could be colonizers as the urine culture was sent from the old nephrostomy tube Pt is on IV ceftriaxone  and flagyl - will dc flagyl  Blood culture negative So will do 7-10 days of antibiotic until 06/09/24 While in house continue Iv ceftriaxone  and then switch to  PO  bactrim  adjusted to crcl/dialysis dose ( ask pharmacy to dose)     New onset  Afib  seen by  cardiology on amio   CHF   Anemia- GI planning endo   ESRD on dialysis thru HD cath     H/o cerebral aneursym s/p craniotomy   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

## 2024-06-04 NOTE — Plan of Care (Signed)
   Problem: Education: Goal: Knowledge of General Education information will improve Description Including pain rating scale, medication(s)/side effects and non-pharmacologic comfort measures Outcome: Progressing   Problem: Health Behavior/Discharge Planning: Goal: Ability to manage health-related needs will improve Outcome: Progressing

## 2024-06-04 NOTE — Progress Notes (Signed)
 Hemoglobin noted at 7.6 g/dl. Dr. Lawence notified of hemoglobin <8. New order received to transfuse 2 units of PRBC. Patient's legal guardian contacted for consent; consent obtained and guardian agreed to blood transfusion, witnessed by Dickey PEAK charge nurse. First unit of PRBC initiated at 3:54am. Patient monitored per protocol.

## 2024-06-05 DIAGNOSIS — Z789 Other specified health status: Secondary | ICD-10-CM

## 2024-06-05 DIAGNOSIS — R0602 Shortness of breath: Secondary | ICD-10-CM | POA: Diagnosis not present

## 2024-06-05 DIAGNOSIS — A419 Sepsis, unspecified organism: Secondary | ICD-10-CM | POA: Diagnosis not present

## 2024-06-05 DIAGNOSIS — N186 End stage renal disease: Secondary | ICD-10-CM | POA: Diagnosis not present

## 2024-06-05 DIAGNOSIS — R531 Weakness: Secondary | ICD-10-CM | POA: Diagnosis not present

## 2024-06-05 DIAGNOSIS — E43 Unspecified severe protein-calorie malnutrition: Secondary | ICD-10-CM

## 2024-06-05 DIAGNOSIS — I959 Hypotension, unspecified: Secondary | ICD-10-CM

## 2024-06-05 DIAGNOSIS — N99528 Other complication of other external stoma of urinary tract: Secondary | ICD-10-CM | POA: Diagnosis not present

## 2024-06-05 DIAGNOSIS — R6521 Severe sepsis with septic shock: Secondary | ICD-10-CM | POA: Diagnosis not present

## 2024-06-05 DIAGNOSIS — K920 Hematemesis: Secondary | ICD-10-CM | POA: Diagnosis not present

## 2024-06-05 LAB — BPAM RBC
Blood Product Expiration Date: 202602022359
Blood Product Expiration Date: 202602072359
ISSUE DATE / TIME: 202601140350
ISSUE DATE / TIME: 202601141210
Unit Type and Rh: 5100
Unit Type and Rh: 5100

## 2024-06-05 LAB — TYPE AND SCREEN
ABO/RH(D): B POS
Antibody Screen: NEGATIVE
Unit division: 0
Unit division: 0

## 2024-06-05 LAB — HEMOGLOBIN AND HEMATOCRIT, BLOOD
HCT: 34.6 % — ABNORMAL LOW (ref 39.0–52.0)
Hemoglobin: 11.3 g/dL — ABNORMAL LOW (ref 13.0–17.0)

## 2024-06-05 MED ORDER — MIDODRINE HCL 5 MG PO TABS
5.0000 mg | ORAL_TABLET | Freq: Three times a day (TID) | ORAL | Status: DC
  Administered 2024-06-05: 5 mg via ORAL
  Filled 2024-06-05 (×3): qty 1

## 2024-06-05 MED ORDER — THIAMINE MONONITRATE 100 MG PO TABS
100.0000 mg | ORAL_TABLET | Freq: Every day | ORAL | Status: DC
  Filled 2024-06-05: qty 1

## 2024-06-05 MED ORDER — BOOST / RESOURCE BREEZE PO LIQD CUSTOM
1.0000 | Freq: Three times a day (TID) | ORAL | Status: DC
  Administered 2024-06-07: 1 via ORAL

## 2024-06-05 MED ORDER — PANTOPRAZOLE SODIUM 40 MG PO TBEC
40.0000 mg | DELAYED_RELEASE_TABLET | Freq: Two times a day (BID) | ORAL | Status: DC
  Filled 2024-06-05 (×2): qty 1

## 2024-06-05 MED ORDER — METOPROLOL SUCCINATE ER 25 MG PO TB24
25.0000 mg | ORAL_TABLET | Freq: Two times a day (BID) | ORAL | Status: DC
  Administered 2024-06-05: 25 mg via ORAL
  Filled 2024-06-05 (×3): qty 1

## 2024-06-05 NOTE — Progress Notes (Signed)
 Nutrition Follow-up  DOCUMENTATION CODES:   Underweight, Severe malnutrition in context of chronic illness  INTERVENTION:   -D/c Nepro -Renal MVI daily -100 mg thiamine  daily x 7 days -Monitor Mg, K, and Phos and replete as needed secondary to high refeeding risk  -Magic cup TID with meals, each supplement provides 290 kcal and 9 grams of protein  -Continue dysphagia 3 diet  NUTRITION DIAGNOSIS:   Severe Malnutrition related to chronic illness (ESRD on HD) as evidenced by percent weight loss, moderate fat depletion, severe fat depletion, moderate muscle depletion, severe muscle depletion.  Ongoing  GOAL:   Patient will meet greater than or equal to 90% of their needs  Unmet  MONITOR:   PO intake, Supplement acceptance  REASON FOR ASSESSMENT:   Consult Assessment of nutrition requirement/status  ASSESSMENT:   54 y.o. 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  Patient was recently seen in the ED on 12/30 with weakness and nausea and vomiting.  No definitive cause was elucidated and patient improved after arrival and he was discharged back to the nursing facility.  It was felt he may have had mild acute pancreatitis due to the mild elevation in lipase.  He was sent back because of tachypnea and reports of shortness of breath.  1/12- right PCN exchange  1/14- refused EGD, advanced to dysphagia 3 diet   Reviewed I/O's: +607 ml x 24 hours and +3.2 L since admission  Case discussed with RN, MD, SLP, and GI. Patent refused EGD yesterday. Diet was advanced to a dysphagia 3 diet per SLP, due to patient with poor dentition.   Patient consumed 0% of dinner last night. He has also been refusing Nepro supplements.   Spoke with patient at  bedside, who was a little more calm and alert today. RD acknowledged to patient that he looked better today compared to previous visit. Patient stated I must have been really sick then.   Patient became agitated during visit and complained that people just keep creeping into my room. RD acknowledged frustration, but reiterated purpose of visit. Patient states he is not eating because I can't eat anyway; what's the point?. RD informed patient that he was on a dysphagia 3 diet, but patient did not respond.   Reviewed weights; weight has ranged from 48.4-53.7 kg over the past 7 days.   Palliative care following for goals of care discussions. Per notes, DSS has spoken to the patient and has agreed to transition to DNR.   Medications reviewed and include phoslo , lactulose , midodrine , protonix , and 0.9% sodium chloride  infusion @ 10 ml/hr.   Labs reviewed: CBGS: 87. K: 3.2, Phos WDL.    Diet Order:   Diet Order             DIET DYS 3 Room service appropriate? Yes; Fluid consistency: Thin  Diet effective now                   EDUCATION NEEDS:   Not appropriate for education at this time  Skin:  Skin Assessment: Reviewed RN Assessment  Last BM:  06/04/24 (type 7)  Height:   Ht Readings from Last 1 Encounters:  06/02/24 5' 4 (1.626 m)    Weight:   Wt Readings from Last 1 Encounters:  06/05/24 53.7 kg    Ideal  Body Weight:  59.1 kg  BMI:  Body mass index is 20.31 kg/m.  Estimated Nutritional Needs:   Kcal:  1700-1900  Protein:  90-105 grams  Fluid:  1000 ml + UOP    Margery ORN, RD, LDN, CDCES Registered Dietitian III Certified Diabetes Care and Education Specialist If unable to reach this RD, please use RD Inpatient group chat on secure chat between hours of 8am-4 pm daily

## 2024-06-05 NOTE — Plan of Care (Signed)

## 2024-06-05 NOTE — Progress Notes (Signed)
 "    Progress Note    Donzell Coller  FMW:[id number] DOB: [birth year]  DOA: 05/28/2024 PCP: [facility]      Brief Narrative:    Medical records reviewed and are as summarized below:  Adeeb Konecny is a 54 y.o. 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 He recently presented to [facility] ED on 12/30 due to weakness, nausea and vomiting.  There was some suspicion of more acute pancreatitis because of mildly elevated lipase but no specific etiology was identified.  He improved after arrival and was discharged back to the SNF where he resides.  He presented back to the ED on 05/28/2024 because of shortness of breath.  He was subsequently transferred to the ICU because of concern for septic shock secondary to complicated acute UTI in the setting of infected right nephrostomy tube.  He was started on IV vasopressors.  He was also treated with empiric IV antibiotics.  There was also concern for aspiration pneumonitis. Urine and body fluid grew E. coli, Citrobacter, Proteus and Enterococcus faecalis.  Right nephrostomy tube was exchanged by IR on 06/02/2024.  He was transferred to [facility] team on 06/03/2024.   Assessment/Plan:   Principal Problem:   Septic shock (HCC) Active Problems:   New onset a-fib (HCC)   Hypotension   HFrEF (heart failure with reduced ejection fraction) (HCC)   Nausea and vomiting   Anemia of chronic renal failure   End stage renal disease (HCC)   Hypertension   Protein calorie malnutrition   Shortness of breath   Goals of care, counseling/discussion   Hematemesis without nausea   Nephrostomy complication   General weakness   Complicated UTI (urinary tract infection)   Protein-calorie malnutrition, severe   ESRD  on dialysis [facility])   Medical orders for scope of treatment form in chart   Nutrition Problem: Severe Malnutrition Etiology: chronic illness (ESRD on HD)  Signs/Symptoms: percent weight loss, moderate fat depletion, severe fat depletion, moderate muscle depletion, severe muscle depletion   Body mass index is 20.31 kg/m.   Septic shock secondary to acute complicated UTI, infected right nephrostomy tube: Shock physiology has resolved.  S/p treatment with IV Levophed  infusion.  S/p exchange of right nephrostomy tube by IR on 06/02/2024.  ID specialist recommended continuing IV ceftriaxone  while patient is in house and transitioning to Bactrim  at discharge through 06/09/2024.  Body fluid culture showed E. coli, Citrobacter koseri, Proteus mirabilis and Enterococcus faecalis. Urine culture showed E. coli, Citrobacter koseri. No growth on blood cultures.   New onset atrial fibrillation with RVR: He has been started on metoprolol .  Oral amiodarone  has been discontinued by cardiologist likely because of prolonged QTc. S/p treatment with IV amiodarone  infusion. He is not on anticoagulation because of anemia   Prolonged QTc interval: Repeat EKG on 06/04/2021 showed QTc interval of 604.  Amiodarone  has been discontinued.   Hypotension: Continue midodrine . This limits the use of rate control medications/antihypertensives.   Acute on chronic anemia, anemia of chronic disease: Hemoglobin improved from 8.5-11.3.  S/p transfusion with 2 units of PRBCs on 06/04/2024.     Nausea, vomiting, hematemesis: Improved.  Patient was scheduled for EGD on 06/04/2024 after hemodialysis.  However, he refused to be transported to the endoscopy suite. Changed IV to oral Protonix   Chronic HFrEF: Compensated.  Carvedilol  and irbesartan  on hold because of soft blood pressure.  EF less than 20%.   ESRD on HD: Follow-up with nephrologist for hemodialysis.   Hypokalemia: Potassium down to 3.2 on 06/04/2024.  Repeat  potassium tomorrow..    Severe protein calorie malnutrition: Continue nutritional supplements.  Follow-up with dietitian.   Chronic debility, poor functional status, history of TBI: [name], NP, with palliative care team had discussed CODE STATUS with DSS. [name], legal guardian with DSS has made patient DNR.      Diet Order             DIET DYS 3 Room service appropriate? Yes; Fluid consistency: Thin  Diet effective now                                  Consultants: Gastroenterologist Nephrologist Interventional radiologist  Procedures: S/p right nephrostomy tube placement on 06/02/2024    Medications:    sodium chloride    Intravenous Once   atorvastatin   40 mg Oral Daily   calcium  acetate  1,334 mg Oral TID WC   Chlorhexidine  Gluconate Cloth  6 each Topical Q0600   epoetin  alfa-epbx (RETACRIT ) injection  10,000 Units Intravenous Q M,W,F-1800   feeding supplement  1 Container Oral TID BM   heparin   5,000 Units Subcutaneous Q8H   lactulose   30 g Oral Once   levothyroxine   50 mcg Oral Daily   lidocaine -prilocaine   1 Application Topical Once per day on Monday Wednesday Friday   metoprolol  succinate  25 mg Oral BID   midodrine   5 mg Oral TID WC   multivitamin  1 tablet Oral QHS   mouth rinse  15 mL Mouth Rinse 4 times per day   pantoprazole  (PROTONIX ) IV  40 mg Intravenous Q12H   sodium chloride  flush  3 mL Intravenous Q12H   [START ON 06/06/2024] thiamine   100 mg Oral Daily   Continuous Infusions:  sodium chloride  10 mL/hr at 06/05/24 0444   cefTRIAXone  (ROCEPHIN )  IV 2 g (06/05/24 0852)     Anti-infectives (From admission, onward)    Start     Dose/Rate Route Frequency Ordered Stop   06/04/24 1000  cefTRIAXone  (ROCEPHIN ) 2 g in sodium chloride  0.9 % 100 mL IVPB        2 g 200 mL/hr over 30 Minutes Intravenous Every 24 hours 06/03/24 2152     06/02/24 1200  vancomycin  (VANCOREADY) IVPB 500 mg/100 mL  Status:  Discontinued        500 mg 100  mL/hr over 60 Minutes Intravenous Every M-W-F (Hemodialysis) 05/31/24 1220 06/01/24 1042   06/01/24 2000  metroNIDAZOLE  (FLAGYL ) IVPB 500 mg  Status:  Discontinued        500 mg 100 mL/hr over 60 Minutes Intravenous Every 8 hours 06/01/24 1707 06/03/24 2152   05/31/24 1000  cefTRIAXone  (ROCEPHIN ) 1 g in sodium chloride  0.9 % 100 mL IVPB  Status:  Discontinued        1 g 200 mL/hr over 30 Minutes Intravenous Every 24 hours 05/31/24 0834 06/03/24 2152   05/30/24 1800  vancomycin  (VANCOREADY) IVPB 500 mg/100 mL        500 mg 100 mL/hr over 60 Minutes Intravenous  Once 05/30/24 0934 05/30/24 1919   05/29/24 0912  ceFAZolin  (ANCEF ) IVPB 1 g/50 mL premix  Status:  Discontinued        1 g 100 mL/hr over 30 Minutes  Intravenous 30 min pre-op  05/29/24 0912 05/31/24 0832   05/29/24 0600  piperacillin -tazobactam (ZOSYN ) IVPB 2.25 g  Status:  Discontinued        2.25 g 100 mL/hr over 30 Minutes Intravenous Every 8 hours 05/29/24 0446 05/31/24 0834   05/28/24 1600  vancomycin  (VANCOREADY) IVPB 1500 mg/300 mL        1,500 mg 150 mL/hr over 120 Minutes Intravenous Once 05/28/24 1410 05/28/24 2131   05/28/24 1330  vancomycin  (VANCOREADY) IVPB 1500 mg/300 mL  Status:  Discontinued        1,500 mg 150 mL/hr over 120 Minutes Intravenous Once 05/28/24 1302 05/28/24 1410   05/28/24 1320  vancomycin  variable dose per unstable renal function (pharmacist dosing)  Status:  Discontinued         Does not apply See admin instructions 05/28/24 1320 05/31/24 1220   05/28/24 1300  cefTRIAXone  (ROCEPHIN ) 2 g in sodium chloride  0.9 % 100 mL IVPB  Status:  Discontinued        2 g 200 mL/hr over 30 Minutes Intravenous Every 24 hours 05/28/24 1246 05/29/24 0525   05/28/24 1200  cefTRIAXone  (ROCEPHIN ) 1 g in sodium chloride  0.9 % 100 mL IVPB  Status:  Discontinued        1 g 200 mL/hr over 30 Minutes Intravenous  Once 05/28/24 1150 05/28/24 1254              Family Communication/Anticipated D/C date and plan/Code  Status   DVT prophylaxis: heparin  injection 5,000 Units Start: 05/28/24 1400     Code Status: Do not attempt resuscitation (DNR) PRE-ARREST INTERVENTIONS DESIRED  Family Communication: None Disposition Plan: Plan to discharge to SNF   Status is: Inpatient Remains inpatient appropriate because: Severe sepsis from acute complicated UTI       Subjective:   Interval events noted.  He has no complaints.  No vomiting or abdominal pain.  Objective:    Vitals:   06/05/24 0515 06/05/24 1000 06/05/24 1100 06/05/24 1200  BP:  124/80 135/83 121/74  Pulse:  75 77 69  Resp:  (!) 28 15 (!) 24  Temp:      TempSrc:      SpO2:  98% 97% 100%  Weight: 53.7 kg     Height:       No data found.   Intake/Output Summary (Last 24 hours) at 06/05/2024 1454 Last data filed at 06/05/2024 1300 Gross per 24 hour  Intake 1227.05 ml  Output --  Net 1227.05 ml   Filed Weights   06/04/24 0810 06/04/24 1300 06/05/24 0515  Weight: 50.2 kg 49.2 kg 53.7 kg    Exam:  GEN: NAD SKIN: Warm and dry EYES: No pallor or icterus ENT: MMM CV: RRR PULM: CTA B ABD: soft, ND, NT, +BS CNS: AAO x 2 (person and place), non focal EXT: No edema or tenderness GU: Right nephrostomy tube in place       Data Reviewed:   I have personally reviewed following labs and imaging studies:  Labs: Labs show the following:   Basic Metabolic Panel: Recent Labs  Lab 05/30/24 0330 05/31/24 0350 06/01/24 0323 06/02/24 0537 06/03/24 0551 06/04/24 0830  NA 137 134* 135 140 141 137  K 3.6 3.6 4.1 3.6 3.5 3.2*  CL 91* 93* 93* 92* 94* 96*  CO2 24 23 21* 23 20* 26  GLUCOSE 116* 88 60* 76 80 67*  BUN 47* 20 32* 46* 59* 21*  CREATININE 9.36* 5.46* 8.45* 11.50* 13.90* 7.04*  CALCIUM  8.9 8.7* 9.0 9.2 9.3 8.5*  MG 1.9  --  2.2 2.3 2.4  --   PHOS 5.0* 3.7 5.3*  --  6.5* 4.2   GFR Estimated Creatinine Clearance: 9 mL/min (A) (by C-G formula based on SCr of 7.04 mg/dL (H)). Liver Function Tests: Recent Labs   Lab 05/30/24 0330 05/31/24 0350 06/01/24 0323 06/03/24 0551 06/04/24 0830  AST  --   --   --  24  --   ALT  --   --   --  24  --   ALKPHOS  --   --   --  67  --   BILITOT  --   --   --  0.4  --   PROT  --   --   --  7.9  --   ALBUMIN  3.1* 3.0* 2.9* 3.0* 2.7*   No results for input(s): LIPASE, AMYLASE in the last 168 hours. No results for input(s): AMMONIA in the last 168 hours. Coagulation profile No results for input(s): INR, PROTIME in the last 168 hours.  CBC: Recent Labs  Lab 05/31/24 0350 06/01/24 0323 06/02/24 0537 06/03/24 0551 06/03/24 1117 06/03/24 2344 06/04/24 0830 06/05/24 0008  WBC 12.7* 14.1* 15.1* 10.7*  --   --  8.6  --   NEUTROABS 9.9*  --   --   --   --   --   --   --   HGB 8.1* 7.5* 8.1* 7.9* 8.5* 7.6* 8.5* 11.3*  HCT 26.4* 24.1* 27.1* 25.6* 27.2* 24.5* 27.4* 34.6*  MCV 103.1* 104.8* 107.1* 106.7*  --   --  98.9  --   PLT 200 195 220 261  --   --  207  --    Cardiac Enzymes: No results for input(s): CKTOTAL, CKMB, CKMBINDEX, TROPONINI in the last 168 hours. BNP (last 3 results) Recent Labs    05/29/24 0621  PROBNP 6,557.0*   CBG: Recent Labs  Lab 05/31/24 0735 06/01/24 0552 06/01/24 0623 06/01/24 0751 06/03/24 1021  GLUCAP 96 63* 127* 83 87   D-Dimer: No results for input(s): DDIMER in the last 72 hours. Hgb A1c: No results for input(s): HGBA1C in the last 72 hours. Lipid Profile: No results for input(s): CHOL, HDL, LDLCALC, TRIG, CHOLHDL, LDLDIRECT in the last 72 hours. Thyroid  function studies: Recent Labs    06/03/24 0605  TSH 5.050*  T4TOTAL 8.0   Anemia work up: No results for input(s): VITAMINB12, FOLATE, FERRITIN, TIBC, IRON, RETICCTPCT in the last 72 hours. Sepsis Labs: Recent Labs  Lab 05/31/24 0350 06/01/24 0323 06/02/24 0537 06/03/24 0551 06/03/24 1117 06/03/24 1550 06/04/24 0830  PROCALCITON 40.10  --   --   --   --   --   --   WBC 12.7* 14.1* 15.1* 10.7*  --    --  8.6  LATICACIDVEN  --   --   --   --  2.0* 0.9  --     Microbiology Recent Results (from the past 240 hours)  Resp panel by RT-PCR (RSV, Flu A&B, Covid) Anterior Nasal Swab     Status: None   Collection Time: 05/28/24 10:07 AM   Specimen: Anterior Nasal Swab  Result Value Ref Range Status   SARS Coronavirus 2 by RT PCR NEGATIVE NEGATIVE Final    Comment: (NOTE) SARS-CoV-2 target nucleic acids are NOT DETECTED.  The SARS-CoV-2 RNA is generally detectable in upper respiratory specimens during the acute phase of infection. The lowest concentration of SARS-CoV-2 viral copies  this assay can detect is 138 copies/mL. A negative result does not preclude SARS-Cov-2 infection and should not be used as the sole basis for treatment or other patient management decisions. A negative result may occur with  improper specimen collection/handling, submission of specimen other than nasopharyngeal swab, presence of viral mutation(s) within the areas targeted by this assay, and inadequate number of viral copies(<138 copies/mL). A negative result must be combined with clinical observations, patient history, and epidemiological information. The expected result is Negative.  Fact Sheet for Patients:  bloggercourse.com  Fact Sheet for Healthcare Providers:  seriousbroker.it  This test is no t yet approved or cleared by the [place]  FDA and  has been authorized for detection and/or diagnosis of SARS-CoV-2 by FDA under an Emergency Use Authorization (EUA). This EUA will remain  in effect (meaning this test can be used) for the duration of the COVID-19 declaration under Section 564(b)(1) of the Act, 21 U.S.C.section 360bbb-3(b)(1), unless the authorization is terminated  or revoked sooner.       Influenza A by PCR NEGATIVE NEGATIVE Final   Influenza B by PCR NEGATIVE NEGATIVE Final    Comment: (NOTE) The Xpert Xpress SARS-CoV-2/FLU/RSV plus  assay is intended as an aid in the diagnosis of influenza from Nasopharyngeal swab specimens and should not be used as a sole basis for treatment. Nasal washings and aspirates are unacceptable for Xpert Xpress SARS-CoV-2/FLU/RSV testing.  Fact Sheet for Patients: bloggercourse.com  Fact Sheet for Healthcare Providers: seriousbroker.it  This test is not yet approved or cleared by the [place]  FDA and has been authorized for detection and/or diagnosis of SARS-CoV-2 by FDA under an Emergency Use Authorization (EUA). This EUA will remain in effect (meaning this test can be used) for the duration of the COVID-19 declaration under Section 564(b)(1) of the Act, 21 U.S.C. section 360bbb-3(b)(1), unless the authorization is terminated or revoked.     Resp Syncytial Virus by PCR NEGATIVE NEGATIVE Final    Comment: (NOTE) Fact Sheet for Patients: bloggercourse.com  Fact Sheet for Healthcare Providers: seriousbroker.it  This test is not yet approved or cleared by the [place]  FDA and has been authorized for detection and/or diagnosis of SARS-CoV-2 by FDA under an Emergency Use Authorization (EUA). This EUA will remain in effect (meaning this test can be used) for the duration of the COVID-19 declaration under Section 564(b)(1) of the Act, 21 U.S.C. section 360bbb-3(b)(1), unless the authorization is terminated or revoked.  Performed at [facility], [address] [place], [place] [address]   Respiratory (~20 pathogens) panel by PCR     Status: None   Collection Time: 05/28/24 10:07 AM   Specimen: Nasopharyngeal Swab; Respiratory  Result Value Ref Range Status   Adenovirus NOT DETECTED NOT DETECTED Final   Coronavirus 229E NOT DETECTED NOT DETECTED Final    Comment: (NOTE) The Coronavirus on the Respiratory Panel, DOES NOT test for the novel  Coronavirus (2019  nCoV)    Coronavirus HKU1 NOT DETECTED NOT DETECTED Final   Coronavirus NL63 NOT DETECTED NOT DETECTED Final   Coronavirus OC43 NOT DETECTED NOT DETECTED Final   Metapneumovirus NOT DETECTED NOT DETECTED Final   Rhinovirus / Enterovirus NOT DETECTED NOT DETECTED Final   Influenza A NOT DETECTED NOT DETECTED Final   Influenza B NOT DETECTED NOT DETECTED Final   Parainfluenza Virus 1 NOT DETECTED NOT DETECTED Final   Parainfluenza Virus 2 NOT DETECTED NOT DETECTED Final   Parainfluenza Virus 3 NOT DETECTED NOT DETECTED Final  Parainfluenza Virus 4 NOT DETECTED NOT DETECTED Final   Respiratory Syncytial Virus NOT DETECTED NOT DETECTED Final   Bordetella pertussis NOT DETECTED NOT DETECTED Final   Bordetella Parapertussis NOT DETECTED NOT DETECTED Final   Chlamydophila pneumoniae NOT DETECTED NOT DETECTED Final   Mycoplasma pneumoniae NOT DETECTED NOT DETECTED Final    Comment: Performed at [facility], [address] [place], [place] [address]  Blood culture (routine x 2)     Status: None   Collection Time: 05/28/24 12:52 PM   Specimen: BLOOD  Result Value Ref Range Status   Specimen Description BLOOD RIGHT ANTECUBITAL  Final   Special Requests   Final    BOTTLES DRAWN AEROBIC AND ANAEROBIC Blood Culture results may not be optimal due to an inadequate volume of blood received in culture bottles   Culture   Final    NO GROWTH 5 DAYS Performed at [facility], [address] [place], [place] [address]    Report Status 06/02/2024 FINAL  Final  Urine Culture     Status: Abnormal   Collection Time: 05/28/24 12:52 PM   Specimen: Urine, Catheterized  Result Value Ref Range Status   Specimen Description   Final    URINE, CATHETERIZED Performed at [facility], [address], [place], [place] [address]    Special Requests   Final    NONE Performed at [facility], [address] [place], [place] [address]    Culture (A)  Final    >=100,000  COLONIES/mL ESCHERICHIA COLI >=100,000 COLONIES/mL CITROBACTER KOSERI    Report Status 05/31/2024 FINAL  Final   Organism ID, Bacteria ESCHERICHIA COLI (A)  Final   Organism ID, Bacteria CITROBACTER KOSERI (A)  Final      Susceptibility   Citrobacter koseri - MIC*    CEFEPIME  <=0.12 SENSITIVE Sensitive     ERTAPENEM <=0.12 SENSITIVE Sensitive     CEFTRIAXONE  <=0.25 SENSITIVE Sensitive     CIPROFLOXACIN  <=0.06 SENSITIVE Sensitive     GENTAMICIN <=1 SENSITIVE Sensitive     NITROFURANTOIN  32 SENSITIVE Sensitive     TRIMETH /SULFA  <=20 SENSITIVE Sensitive     PIP/TAZO Value in next row Sensitive      <=4 SENSITIVEThis is a modified FDA-approved test that has been validated and its performance characteristics determined by the reporting laboratory.  This laboratory is certified under the Clinical Laboratory Improvement Amendments CLIA as qualified to perform high complexity clinical laboratory testing.    MEROPENEM Value in next row Sensitive      <=4 SENSITIVEThis is a modified FDA-approved test that has been validated and its performance characteristics determined by the reporting laboratory.  This laboratory is certified under the Clinical Laboratory Improvement Amendments CLIA as qualified to perform high complexity clinical laboratory testing.    * >=100,000 COLONIES/mL CITROBACTER KOSERI   Escherichia coli - MIC*    AMPICILLIN Value in next row Sensitive      <=4 SENSITIVEThis is a modified FDA-approved test that has been validated and its performance characteristics determined by the reporting laboratory.  This laboratory is certified under the Clinical Laboratory Improvement Amendments CLIA as qualified to perform high complexity clinical laboratory testing.    CEFAZOLIN  (URINE) Value in next row Sensitive      2 SENSITIVEThis is a modified FDA-approved test that has been validated and its performance characteristics determined by the reporting laboratory.  This laboratory is certified  under the Clinical Laboratory Improvement Amendments CLIA as qualified to perform high complexity  clinical laboratory testing.    CEFEPIME  Value in next row Sensitive      2 SENSITIVEThis is a modified FDA-approved test that has been validated and its performance characteristics determined by the reporting laboratory.  This laboratory is certified under the Clinical Laboratory Improvement Amendments CLIA as qualified to perform high complexity clinical laboratory testing.    ERTAPENEM Value in next row Sensitive      2 SENSITIVEThis is a modified FDA-approved test that has been validated and its performance characteristics determined by the reporting laboratory.  This laboratory is certified under the Clinical Laboratory Improvement Amendments CLIA as qualified to perform high complexity clinical laboratory testing.    CEFTRIAXONE  Value in next row Sensitive      2 SENSITIVEThis is a modified FDA-approved test that has been validated and its performance characteristics determined by the reporting laboratory.  This laboratory is certified under the Clinical Laboratory Improvement Amendments CLIA as qualified to perform high complexity clinical laboratory testing.    CIPROFLOXACIN  Value in next row Sensitive      2 SENSITIVEThis is a modified FDA-approved test that has been validated and its performance characteristics determined by the reporting laboratory.  This laboratory is certified under the Clinical Laboratory Improvement Amendments CLIA as qualified to perform high complexity clinical laboratory testing.    GENTAMICIN Value in next row Sensitive      2 SENSITIVEThis is a modified FDA-approved test that has been validated and its performance characteristics determined by the reporting laboratory.  This laboratory is certified under the Clinical Laboratory Improvement Amendments CLIA as qualified to perform high complexity clinical laboratory testing.    NITROFURANTOIN  Value in next row Sensitive       2 SENSITIVEThis is a modified FDA-approved test that has been validated and its performance characteristics determined by the reporting laboratory.  This laboratory is certified under the Clinical Laboratory Improvement Amendments CLIA as qualified to perform high complexity clinical laboratory testing.    TRIMETH /SULFA  Value in next row Sensitive      2 SENSITIVEThis is a modified FDA-approved test that has been validated and its performance characteristics determined by the reporting laboratory.  This laboratory is certified under the Clinical Laboratory Improvement Amendments CLIA as qualified to perform high complexity clinical laboratory testing.    AMPICILLIN/SULBACTAM Value in next row Sensitive      2 SENSITIVEThis is a modified FDA-approved test that has been validated and its performance characteristics determined by the reporting laboratory.  This laboratory is certified under the Clinical Laboratory Improvement Amendments CLIA as qualified to perform high complexity clinical laboratory testing.    PIP/TAZO Value in next row Sensitive      <=4 SENSITIVEThis is a modified FDA-approved test that has been validated and its performance characteristics determined by the reporting laboratory.  This laboratory is certified under the Clinical Laboratory Improvement Amendments CLIA as qualified to perform high complexity clinical laboratory testing.    MEROPENEM Value in next row Sensitive      <=4 SENSITIVEThis is a modified FDA-approved test that has been validated and its performance characteristics determined by the reporting laboratory.  This laboratory is certified under the Clinical Laboratory Improvement Amendments CLIA as qualified to perform high complexity clinical laboratory testing.    * >=100,000 COLONIES/mL ESCHERICHIA COLI  Expectorated Sputum Assessment w Gram Stain, Rflx to Resp Cult     Status: None   Collection Time: 05/28/24  1:18 PM   Specimen: Salivary Gland; Sputum  Result  Value Ref Range Status  Specimen Description SALIVA  Final   Special Requests EXPSU  Final   Sputum evaluation   Final    THIS SPECIMEN IS ACCEPTABLE FOR SPUTUM CULTURE Performed at [facility], [address] [place], [place] [address]    Report Status 05/29/2024 FINAL  Final  Culture, Respiratory w Gram Stain     Status: None   Collection Time: 05/28/24  1:18 PM  Result Value Ref Range Status   Specimen Description EXPECTORATED SPUTUM  Final   Special Requests Reflexed from [id number]  Final   Gram Stain   Final    FEW WBC PRESENT, PREDOMINANTLY PMN RARE GRAM NEGATIVE RODS Performed at [facility], [address] [place], [place] [address]    Culture RARE CANDIDA ALBICANS  Final   Report Status 06/01/2024 FINAL  Final  MRSA Next Gen by PCR, Nasal     Status: None   Collection Time: 05/28/24  9:40 PM   Specimen: Nasal Mucosa; Nasal Swab  Result Value Ref Range Status   MRSA by PCR Next Gen NOT DETECTED NOT DETECTED Final    Comment: (NOTE) The GeneXpert MRSA Assay (FDA approved for NASAL specimens only), is one component of a comprehensive MRSA colonization surveillance program. It is not intended to diagnose MRSA infection nor to guide or monitor treatment for MRSA infections. Test performance is not FDA approved in patients less than 64 years old. Performed at [facility], [address] [place], [place] [address]   Blood culture (routine x 2)     Status: None   Collection Time: 05/28/24 10:24 PM   Specimen: BLOOD  Result Value Ref Range Status   Specimen Description BLOOD BLOOD RIGHT HAND  Final   Special Requests   Final    BOTTLES DRAWN AEROBIC ONLY Blood Culture results may not be optimal due to an inadequate volume of blood received in culture bottles   Culture   Final    NO GROWTH 5 DAYS Performed at [facility], [address] [place], [place] [address]    Report Status 06/02/2024 FINAL  Final  Body fluid culture w Gram  Stain     Status: None   Collection Time: 05/29/24  8:00 AM   Specimen: Other Source; Body Fluid  Result Value Ref Range Status   Specimen Description   Final    OTHER Performed at [facility], [address] [place], [place] [address]    Special Requests   Final    NONE Performed at [facility], [address] [place], [place] [address]    Gram Stain   Final    FEW WBC PRESENT, PREDOMINANTLY PMN MODERATE GRAM POSITIVE COCCI FEW GRAM NEGATIVE RODS Performed at [facility], [address] [place], [place] [address]    Culture   Final    ABUNDANT ENTEROCOCCUS FAECALIS ABUNDANT ESCHERICHIA COLI ABUNDANT CITROBACTER KOSERI FEW PROTEUS MIRABILIS    Report Status 06/03/2024 FINAL  Final   Organism ID, Bacteria ESCHERICHIA COLI  Final   Organism ID, Bacteria CITROBACTER KOSERI  Final   Organism ID, Bacteria PROTEUS MIRABILIS  Final   Organism ID, Bacteria ENTEROCOCCUS FAECALIS  Final      Susceptibility   Citrobacter koseri - MIC*    CEFEPIME  <=0.12 SENSITIVE Sensitive     ERTAPENEM <=0.12 SENSITIVE Sensitive     CEFTRIAXONE  <=0.25 SENSITIVE Sensitive     CIPROFLOXACIN  <=0.06 SENSITIVE Sensitive     GENTAMICIN <=1 SENSITIVE Sensitive  MEROPENEM <=0.25 SENSITIVE Sensitive     TRIMETH /SULFA  <=20 SENSITIVE Sensitive     PIP/TAZO Value in next row Sensitive      <=4 SENSITIVEThis is a modified FDA-approved test that has been validated and its performance characteristics determined by the reporting laboratory.  This laboratory is certified under the Clinical Laboratory Improvement Amendments CLIA as qualified to perform high complexity clinical laboratory testing.    * ABUNDANT CITROBACTER KOSERI   Escherichia coli - MIC*    AMPICILLIN Value in next row Sensitive      <=4 SENSITIVEThis is a modified FDA-approved test that has been validated and its performance characteristics determined by the reporting laboratory.  This laboratory is certified under  the Clinical Laboratory Improvement Amendments CLIA as qualified to perform high complexity clinical laboratory testing.    CEFAZOLIN  (NON-URINE) Value in next row Sensitive      <=4 SENSITIVEThis is a modified FDA-approved test that has been validated and its performance characteristics determined by the reporting laboratory.  This laboratory is certified under the Clinical Laboratory Improvement Amendments CLIA as qualified to perform high complexity clinical laboratory testing.    CEFEPIME  Value in next row Sensitive      <=4 SENSITIVEThis is a modified FDA-approved test that has been validated and its performance characteristics determined by the reporting laboratory.  This laboratory is certified under the Clinical Laboratory Improvement Amendments CLIA as qualified to perform high complexity clinical laboratory testing.    ERTAPENEM Value in next row Sensitive      <=4 SENSITIVEThis is a modified FDA-approved test that has been validated and its performance characteristics determined by the reporting laboratory.  This laboratory is certified under the Clinical Laboratory Improvement Amendments CLIA as qualified to perform high complexity clinical laboratory testing.    CEFTRIAXONE  Value in next row Sensitive      <=4 SENSITIVEThis is a modified FDA-approved test that has been validated and its performance characteristics determined by the reporting laboratory.  This laboratory is certified under the Clinical Laboratory Improvement Amendments CLIA as qualified to perform high complexity clinical laboratory testing.    CIPROFLOXACIN  Value in next row Sensitive      <=4 SENSITIVEThis is a modified FDA-approved test that has been validated and its performance characteristics determined by the reporting laboratory.  This laboratory is certified under the Clinical Laboratory Improvement Amendments CLIA as qualified to perform high complexity clinical laboratory testing.    GENTAMICIN Value in next row  Sensitive      <=4 SENSITIVEThis is a modified FDA-approved test that has been validated and its performance characteristics determined by the reporting laboratory.  This laboratory is certified under the Clinical Laboratory Improvement Amendments CLIA as qualified to perform high complexity clinical laboratory testing.    MEROPENEM Value in next row Sensitive      <=4 SENSITIVEThis is a modified FDA-approved test that has been validated and its performance characteristics determined by the reporting laboratory.  This laboratory is certified under the Clinical Laboratory Improvement Amendments CLIA as qualified to perform high complexity clinical laboratory testing.    TRIMETH /SULFA  Value in next row Sensitive      <=4 SENSITIVEThis is a modified FDA-approved test that has been validated and its performance characteristics determined by the reporting laboratory.  This laboratory is certified under the Clinical Laboratory Improvement Amendments CLIA as qualified to perform high complexity clinical laboratory testing.    AMPICILLIN/SULBACTAM Value in next row Sensitive      <=4 SENSITIVEThis is a modified FDA-approved test  that has been validated and its performance characteristics determined by the reporting laboratory.  This laboratory is certified under the Clinical Laboratory Improvement Amendments CLIA as qualified to perform high complexity clinical laboratory testing.    PIP/TAZO Value in next row Sensitive      <=4 SENSITIVEThis is a modified FDA-approved test that has been validated and its performance characteristics determined by the reporting laboratory.  This laboratory is certified under the Clinical Laboratory Improvement Amendments CLIA as qualified to perform high complexity clinical laboratory testing.    * ABUNDANT ESCHERICHIA COLI   Enterococcus faecalis - MIC*    AMPICILLIN Value in next row Sensitive      <=4 SENSITIVEThis is a modified FDA-approved test that has been validated and  its performance characteristics determined by the reporting laboratory.  This laboratory is certified under the Clinical Laboratory Improvement Amendments CLIA as qualified to perform high complexity clinical laboratory testing.    VANCOMYCIN  Value in next row Sensitive      <=4 SENSITIVEThis is a modified FDA-approved test that has been validated and its performance characteristics determined by the reporting laboratory.  This laboratory is certified under the Clinical Laboratory Improvement Amendments CLIA as qualified to perform high complexity clinical laboratory testing.    GENTAMICIN SYNERGY Value in next row Sensitive      <=4 SENSITIVEThis is a modified FDA-approved test that has been validated and its performance characteristics determined by the reporting laboratory.  This laboratory is certified under the Clinical Laboratory Improvement Amendments CLIA as qualified to perform high complexity clinical laboratory testing.    * ABUNDANT ENTEROCOCCUS FAECALIS   Proteus mirabilis - MIC*    AMPICILLIN Value in next row Sensitive      <=4 SENSITIVEThis is a modified FDA-approved test that has been validated and its performance characteristics determined by the reporting laboratory.  This laboratory is certified under the Clinical Laboratory Improvement Amendments CLIA as qualified to perform high complexity clinical laboratory testing.    CEFAZOLIN  (NON-URINE) Value in next row Intermediate      <=4 SENSITIVEThis is a modified FDA-approved test that has been validated and its performance characteristics determined by the reporting laboratory.  This laboratory is certified under the Clinical Laboratory Improvement Amendments CLIA as qualified to perform high complexity clinical laboratory testing.    CEFEPIME  Value in next row Sensitive      <=4 SENSITIVEThis is a modified FDA-approved test that has been validated and its performance characteristics determined by the reporting laboratory.  This  laboratory is certified under the Clinical Laboratory Improvement Amendments CLIA as qualified to perform high complexity clinical laboratory testing.    ERTAPENEM Value in next row Sensitive      <=4 SENSITIVEThis is a modified FDA-approved test that has been validated and its performance characteristics determined by the reporting laboratory.  This laboratory is certified under the Clinical Laboratory Improvement Amendments CLIA as qualified to perform high complexity clinical laboratory testing.    CEFTRIAXONE  Value in next row Sensitive      <=4 SENSITIVEThis is a modified FDA-approved test that has been validated and its performance characteristics determined by the reporting laboratory.  This laboratory is certified under the Clinical Laboratory Improvement Amendments CLIA as qualified to perform high complexity clinical laboratory testing.    CIPROFLOXACIN  Value in next row Sensitive      <=4 SENSITIVEThis is a modified FDA-approved test that has been validated and its performance characteristics determined by the reporting laboratory.  This laboratory is certified under the  Clinical Laboratory Improvement Amendments CLIA as qualified to perform high complexity clinical laboratory testing.    GENTAMICIN Value in next row Sensitive      <=4 SENSITIVEThis is a modified FDA-approved test that has been validated and its performance characteristics determined by the reporting laboratory.  This laboratory is certified under the Clinical Laboratory Improvement Amendments CLIA as qualified to perform high complexity clinical laboratory testing.    MEROPENEM Value in next row Sensitive      <=4 SENSITIVEThis is a modified FDA-approved test that has been validated and its performance characteristics determined by the reporting laboratory.  This laboratory is certified under the Clinical Laboratory Improvement Amendments CLIA as qualified to perform high complexity clinical laboratory testing.     TRIMETH /SULFA  Value in next row Sensitive      <=4 SENSITIVEThis is a modified FDA-approved test that has been validated and its performance characteristics determined by the reporting laboratory.  This laboratory is certified under the Clinical Laboratory Improvement Amendments CLIA as qualified to perform high complexity clinical laboratory testing.    AMPICILLIN/SULBACTAM Value in next row Sensitive      <=4 SENSITIVEThis is a modified FDA-approved test that has been validated and its performance characteristics determined by the reporting laboratory.  This laboratory is certified under the Clinical Laboratory Improvement Amendments CLIA as qualified to perform high complexity clinical laboratory testing.    PIP/TAZO Value in next row Sensitive      <=4 SENSITIVEThis is a modified FDA-approved test that has been validated and its performance characteristics determined by the reporting laboratory.  This laboratory is certified under the Clinical Laboratory Improvement Amendments CLIA as qualified to perform high complexity clinical laboratory testing.    * FEW PROTEUS MIRABILIS    Procedures and diagnostic studies:  No results found.              LOS: 8 days   Mercede Rollo  [facility]   Pager on [email or web address]. If 7PM-7AM, please contact night-coverage at [email or web address]     06/05/2024, 2:54 PM           "

## 2024-06-05 NOTE — Progress Notes (Signed)
 " [facility]  ROUNDING NOTE   Subjective:   Larry Morrison is a 54 year old male with past medical conditions including tobacco abuse, anemia, hypertension, TBI, malnutrition, combined heart failure, cocaine abuse, bilateral nephrostomy tubes and end-stage renal disease on hemodialysis.  Patient presents to the emergency department for shortness of breath and has been admitted for Shortness of breath [R06.02] General weakness [R53.1] ESRD on dialysis (HCC) [N18.6, Z99.2] Nephrostomy complication [N99.528] Sepsis (HCC) [A41.9] Elevated procalcitonin [R79.89] . Patient is known to our practice and receives outpatient dialysis treatments at [facility] on a MWF schedule, supervised by Dr. Douglas.    Update:   Patient seen sitting up in bed Alert and oriented Room air at this time  Objective:  Vital signs in last 24 hours:  Temp:  [97.7 F (36.5 C)-98.5 F (36.9 C)] 98.5 F (36.9 C) (01/14 2016) Pulse Rate:  [63-100] 69 (01/15 1200) Resp:  [15-41] 24 (01/15 1200) BP: (110-135)/(67-94) 121/74 (01/15 1200) SpO2:  [93 %-100 %] 100 % (01/15 1200) Weight:  [49.2 kg-53.7 kg] 53.7 kg (01/15 0515)  Weight change: 3.1 kg Filed Weights   06/04/24 0810 06/04/24 1300 06/05/24 0515  Weight: 50.2 kg 49.2 kg 53.7 kg    Intake/Output: I/O last 3 completed shifts: In: 1924.7 [P.O.:240; I.V.:890.1; Blood:694.7; IV Piggyback:100] Out: 1000 [Other:1000]   Intake/Output this shift:  No intake/output data recorded.  Physical Exam: General: Laying in bed  Head: Normocephalic, atraumatic. Moist oral mucosal membranes  Eyes: Anicteric  Lungs:  Diminished, cough  Heart: Regular rate and rhythm  Abdomen:  Soft, nontender, +urostomy tubes bilaterally  Extremities: 2+ peripheral edema.  Neurologic: Alert  Skin: Warm,dry, no rash  Access: Left chest IJ PermCath, left AVG nonfunctioning      Basic Metabolic Panel: Recent Labs  Lab 05/30/24 0330 05/31/24 0350  06/01/24 0323 06/02/24 0537 06/03/24 0551 06/04/24 0830  NA 137 134* 135 140 141 137  K 3.6 3.6 4.1 3.6 3.5 3.2*  CL 91* 93* 93* 92* 94* 96*  CO2 24 23 21* 23 20* 26  GLUCOSE 116* 88 60* 76 80 67*  BUN 47* 20 32* 46* 59* 21*  CREATININE 9.36* 5.46* 8.45* 11.50* 13.90* 7.04*  CALCIUM  8.9 8.7* 9.0 9.2 9.3 8.5*  MG 1.9  --  2.2 2.3 2.4  --   PHOS 5.0* 3.7 5.3*  --  6.5* 4.2    Liver Function Tests: Recent Labs  Lab 05/30/24 0330 05/31/24 0350 06/01/24 0323 06/03/24 0551 06/04/24 0830  AST  --   --   --  24  --   ALT  --   --   --  24  --   ALKPHOS  --   --   --  67  --   BILITOT  --   --   --  0.4  --   PROT  --   --   --  7.9  --   ALBUMIN  3.1* 3.0* 2.9* 3.0* 2.7*   No results for input(s): LIPASE, AMYLASE in the last 168 hours. No results for input(s): AMMONIA in the last 168 hours.  CBC: Recent Labs  Lab 05/31/24 0350 06/01/24 0323 06/02/24 0537 06/03/24 0551 06/03/24 1117 06/03/24 2344 06/04/24 0830 06/05/24 0008  WBC 12.7* 14.1* 15.1* 10.7*  --   --  8.6  --   NEUTROABS 9.9*  --   --   --   --   --   --   --   HGB 8.1* 7.5* 8.1*  7.9* 8.5* 7.6* 8.5* 11.3*  HCT 26.4* 24.1* 27.1* 25.6* 27.2* 24.5* 27.4* 34.6*  MCV 103.1* 104.8* 107.1* 106.7*  --   --  98.9  --   PLT 200 195 220 261  --   --  207  --     Cardiac Enzymes: No results for input(s): CKTOTAL, CKMB, CKMBINDEX, TROPONINI in the last 168 hours.  BNP: Invalid input(s): POCBNP  CBG: Recent Labs  Lab 05/31/24 0735 06/01/24 0552 06/01/24 0623 06/01/24 0751 06/03/24 1021  GLUCAP 96 63* 127* 83 87    Microbiology: Results for orders placed or performed during the hospital encounter of 05/28/24  Resp panel by RT-PCR (RSV, Flu A&B, Covid) Anterior Nasal Swab     Status: None   Collection Time: 05/28/24 10:07 AM   Specimen: Anterior Nasal Swab  Result Value Ref Range Status   SARS Coronavirus 2 by RT PCR NEGATIVE NEGATIVE Final    Comment: (NOTE) SARS-CoV-2 target nucleic  acids are NOT DETECTED.  The SARS-CoV-2 RNA is generally detectable in upper respiratory specimens during the acute phase of infection. The lowest concentration of SARS-CoV-2 viral copies this assay can detect is 138 copies/mL. A negative result does not preclude SARS-Cov-2 infection and should not be used as the sole basis for treatment or other patient management decisions. A negative result may occur with  improper specimen collection/handling, submission of specimen other than nasopharyngeal swab, presence of viral mutation(s) within the areas targeted by this assay, and inadequate number of viral copies(<138 copies/mL). A negative result must be combined with clinical observations, patient history, and epidemiological information. The expected result is Negative.  Fact Sheet for Patients:  bloggercourse.com  Fact Sheet for Healthcare Providers:  seriousbroker.it  This test is no t yet approved or cleared by the [place]  FDA and  has been authorized for detection and/or diagnosis of SARS-CoV-2 by FDA under an Emergency Use Authorization (EUA). This EUA will remain  in effect (meaning this test can be used) for the duration of the COVID-19 declaration under Section 564(b)(1) of the Act, 21 U.S.C.section 360bbb-3(b)(1), unless the authorization is terminated  or revoked sooner.       Influenza A by PCR NEGATIVE NEGATIVE Final   Influenza B by PCR NEGATIVE NEGATIVE Final    Comment: (NOTE) The Xpert Xpress SARS-CoV-2/FLU/RSV plus assay is intended as an aid in the diagnosis of influenza from Nasopharyngeal swab specimens and should not be used as a sole basis for treatment. Nasal washings and aspirates are unacceptable for Xpert Xpress SARS-CoV-2/FLU/RSV testing.  Fact Sheet for Patients: bloggercourse.com  Fact Sheet for Healthcare Providers: seriousbroker.it  This  test is not yet approved or cleared by the [place]  FDA and has been authorized for detection and/or diagnosis of SARS-CoV-2 by FDA under an Emergency Use Authorization (EUA). This EUA will remain in effect (meaning this test can be used) for the duration of the COVID-19 declaration under Section 564(b)(1) of the Act, 21 U.S.C. section 360bbb-3(b)(1), unless the authorization is terminated or revoked.     Resp Syncytial Virus by PCR NEGATIVE NEGATIVE Final    Comment: (NOTE) Fact Sheet for Patients: bloggercourse.com  Fact Sheet for Healthcare Providers: seriousbroker.it  This test is not yet approved or cleared by the [place]  FDA and has been authorized for detection and/or diagnosis of SARS-CoV-2 by FDA under an Emergency Use Authorization (EUA). This EUA will remain in effect (meaning this test can be used) for the duration of the COVID-19 declaration under Section 564(b)(1)  of the Act, 21 U.S.C. section 360bbb-3(b)(1), unless the authorization is terminated or revoked.  Performed at [facility], [address] [place], [place] [address]   Respiratory (~20 pathogens) panel by PCR     Status: None   Collection Time: 05/28/24 10:07 AM   Specimen: Nasopharyngeal Swab; Respiratory  Result Value Ref Range Status   Adenovirus NOT DETECTED NOT DETECTED Final   Coronavirus 229E NOT DETECTED NOT DETECTED Final    Comment: (NOTE) The Coronavirus on the Respiratory Panel, DOES NOT test for the novel  Coronavirus (2019 nCoV)    Coronavirus HKU1 NOT DETECTED NOT DETECTED Final   Coronavirus NL63 NOT DETECTED NOT DETECTED Final   Coronavirus OC43 NOT DETECTED NOT DETECTED Final   Metapneumovirus NOT DETECTED NOT DETECTED Final   Rhinovirus / Enterovirus NOT DETECTED NOT DETECTED Final   Influenza A NOT DETECTED NOT DETECTED Final   Influenza B NOT DETECTED NOT DETECTED Final   Parainfluenza Virus 1 NOT DETECTED  NOT DETECTED Final   Parainfluenza Virus 2 NOT DETECTED NOT DETECTED Final   Parainfluenza Virus 3 NOT DETECTED NOT DETECTED Final   Parainfluenza Virus 4 NOT DETECTED NOT DETECTED Final   Respiratory Syncytial Virus NOT DETECTED NOT DETECTED Final   Bordetella pertussis NOT DETECTED NOT DETECTED Final   Bordetella Parapertussis NOT DETECTED NOT DETECTED Final   Chlamydophila pneumoniae NOT DETECTED NOT DETECTED Final   Mycoplasma pneumoniae NOT DETECTED NOT DETECTED Final    Comment: Performed at [facility], [address], [place], [place] [address]  Blood culture (routine x 2)     Status: None   Collection Time: 05/28/24 12:52 PM   Specimen: BLOOD  Result Value Ref Range Status   Specimen Description BLOOD RIGHT ANTECUBITAL  Final   Special Requests   Final    BOTTLES DRAWN AEROBIC AND ANAEROBIC Blood Culture results may not be optimal due to an inadequate volume of blood received in culture bottles   Culture   Final    NO GROWTH 5 DAYS Performed at [facility], [address] [place], [place] [address]    Report Status 06/02/2024 FINAL  Final  Urine Culture     Status: Abnormal   Collection Time: 05/28/24 12:52 PM   Specimen: Urine, Catheterized  Result Value Ref Range Status   Specimen Description   Final    URINE, CATHETERIZED Performed at [facility], [address], [place], [place] [address]    Special Requests   Final    NONE Performed at [facility], [address] [place], [place] [address]    Culture (A)  Final    >=100,000 COLONIES/mL ESCHERICHIA COLI >=100,000 COLONIES/mL CITROBACTER KOSERI    Report Status 05/31/2024 FINAL  Final   Organism ID, Bacteria ESCHERICHIA COLI (A)  Final   Organism ID, Bacteria CITROBACTER KOSERI (A)  Final      Susceptibility   Citrobacter koseri - MIC*    CEFEPIME  <=0.12 SENSITIVE Sensitive     ERTAPENEM <=0.12 SENSITIVE Sensitive     CEFTRIAXONE  <=0.25 SENSITIVE Sensitive      CIPROFLOXACIN  <=0.06 SENSITIVE Sensitive     GENTAMICIN <=1 SENSITIVE Sensitive     NITROFURANTOIN  32 SENSITIVE Sensitive     TRIMETH /SULFA  <=20 SENSITIVE Sensitive     PIP/TAZO Value in next row Sensitive      <=4 SENSITIVEThis is a modified FDA-approved test that has been validated and its performance characteristics determined by the reporting laboratory.  This laboratory is  certified under the Clinical Laboratory Improvement Amendments CLIA as qualified to perform high complexity clinical laboratory testing.    MEROPENEM Value in next row Sensitive      <=4 SENSITIVEThis is a modified FDA-approved test that has been validated and its performance characteristics determined by the reporting laboratory.  This laboratory is certified under the Clinical Laboratory Improvement Amendments CLIA as qualified to perform high complexity clinical laboratory testing.    * >=100,000 COLONIES/mL CITROBACTER KOSERI   Escherichia coli - MIC*    AMPICILLIN Value in next row Sensitive      <=4 SENSITIVEThis is a modified FDA-approved test that has been validated and its performance characteristics determined by the reporting laboratory.  This laboratory is certified under the Clinical Laboratory Improvement Amendments CLIA as qualified to perform high complexity clinical laboratory testing.    CEFAZOLIN  (URINE) Value in next row Sensitive      2 SENSITIVEThis is a modified FDA-approved test that has been validated and its performance characteristics determined by the reporting laboratory.  This laboratory is certified under the Clinical Laboratory Improvement Amendments CLIA as qualified to perform high complexity clinical laboratory testing.    CEFEPIME  Value in next row Sensitive      2 SENSITIVEThis is a modified FDA-approved test that has been validated and its performance characteristics determined by the reporting laboratory.  This laboratory is certified under the Clinical Laboratory Improvement Amendments  CLIA as qualified to perform high complexity clinical laboratory testing.    ERTAPENEM Value in next row Sensitive      2 SENSITIVEThis is a modified FDA-approved test that has been validated and its performance characteristics determined by the reporting laboratory.  This laboratory is certified under the Clinical Laboratory Improvement Amendments CLIA as qualified to perform high complexity clinical laboratory testing.    CEFTRIAXONE  Value in next row Sensitive      2 SENSITIVEThis is a modified FDA-approved test that has been validated and its performance characteristics determined by the reporting laboratory.  This laboratory is certified under the Clinical Laboratory Improvement Amendments CLIA as qualified to perform high complexity clinical laboratory testing.    CIPROFLOXACIN  Value in next row Sensitive      2 SENSITIVEThis is a modified FDA-approved test that has been validated and its performance characteristics determined by the reporting laboratory.  This laboratory is certified under the Clinical Laboratory Improvement Amendments CLIA as qualified to perform high complexity clinical laboratory testing.    GENTAMICIN Value in next row Sensitive      2 SENSITIVEThis is a modified FDA-approved test that has been validated and its performance characteristics determined by the reporting laboratory.  This laboratory is certified under the Clinical Laboratory Improvement Amendments CLIA as qualified to perform high complexity clinical laboratory testing.    NITROFURANTOIN  Value in next row Sensitive      2 SENSITIVEThis is a modified FDA-approved test that has been validated and its performance characteristics determined by the reporting laboratory.  This laboratory is certified under the Clinical Laboratory Improvement Amendments CLIA as qualified to perform high complexity clinical laboratory testing.    TRIMETH /SULFA  Value in next row Sensitive      2 SENSITIVEThis is a modified FDA-approved  test that has been validated and its performance characteristics determined by the reporting laboratory.  This laboratory is certified under the Clinical Laboratory Improvement Amendments CLIA as qualified to perform high complexity clinical laboratory testing.    AMPICILLIN/SULBACTAM Value in next row Sensitive      2 SENSITIVEThis  is a modified FDA-approved test that has been validated and its performance characteristics determined by the reporting laboratory.  This laboratory is certified under the Clinical Laboratory Improvement Amendments CLIA as qualified to perform high complexity clinical laboratory testing.    PIP/TAZO Value in next row Sensitive      <=4 SENSITIVEThis is a modified FDA-approved test that has been validated and its performance characteristics determined by the reporting laboratory.  This laboratory is certified under the Clinical Laboratory Improvement Amendments CLIA as qualified to perform high complexity clinical laboratory testing.    MEROPENEM Value in next row Sensitive      <=4 SENSITIVEThis is a modified FDA-approved test that has been validated and its performance characteristics determined by the reporting laboratory.  This laboratory is certified under the Clinical Laboratory Improvement Amendments CLIA as qualified to perform high complexity clinical laboratory testing.    * >=100,000 COLONIES/mL ESCHERICHIA COLI  Expectorated Sputum Assessment w Gram Stain, Rflx to Resp Cult     Status: None   Collection Time: 05/28/24  1:18 PM   Specimen: Salivary Gland; Sputum  Result Value Ref Range Status   Specimen Description SALIVA  Final   Special Requests EXPSU  Final   Sputum evaluation   Final    THIS SPECIMEN IS ACCEPTABLE FOR SPUTUM CULTURE Performed at [facility], [address] [place], [place] [address]    Report Status 05/29/2024 FINAL  Final  Culture, Respiratory w Gram Stain     Status: None   Collection Time: 05/28/24  1:18 PM  Result Value  Ref Range Status   Specimen Description EXPECTORATED SPUTUM  Final   Special Requests Reflexed from [id number]  Final   Gram Stain   Final    FEW WBC PRESENT, PREDOMINANTLY PMN RARE GRAM NEGATIVE RODS Performed at [facility], [address] [place], [place] [address]    Culture RARE CANDIDA ALBICANS  Final   Report Status 06/01/2024 FINAL  Final  MRSA Next Gen by PCR, Nasal     Status: None   Collection Time: 05/28/24  9:40 PM   Specimen: Nasal Mucosa; Nasal Swab  Result Value Ref Range Status   MRSA by PCR Next Gen NOT DETECTED NOT DETECTED Final    Comment: (NOTE) The GeneXpert MRSA Assay (FDA approved for NASAL specimens only), is one component of a comprehensive MRSA colonization surveillance program. It is not intended to diagnose MRSA infection nor to guide or monitor treatment for MRSA infections. Test performance is not FDA approved in patients less than 57 years old. Performed at [facility], [address] [place], [place] [address]   Blood culture (routine x 2)     Status: None   Collection Time: 05/28/24 10:24 PM   Specimen: BLOOD  Result Value Ref Range Status   Specimen Description BLOOD BLOOD RIGHT HAND  Final   Special Requests   Final    BOTTLES DRAWN AEROBIC ONLY Blood Culture results may not be optimal due to an inadequate volume of blood received in culture bottles   Culture   Final    NO GROWTH 5 DAYS Performed at [facility], [address] [place], [place] [address]    Report Status 06/02/2024 FINAL  Final  Body fluid culture w Gram Stain     Status: None   Collection Time: 05/29/24  8:00 AM   Specimen: Other Source; Body Fluid  Result Value Ref Range Status   Specimen Description   Final  OTHER Performed at [facility], [address] [place], [place] [address]    Special Requests   Final    NONE Performed at [facility], [address] [place], [place] [address]    Gram Stain   Final     FEW WBC PRESENT, PREDOMINANTLY PMN MODERATE GRAM POSITIVE COCCI FEW GRAM NEGATIVE RODS Performed at [facility], [address], [place], [place] [address]    Culture   Final    ABUNDANT ENTEROCOCCUS FAECALIS ABUNDANT ESCHERICHIA COLI ABUNDANT CITROBACTER KOSERI FEW PROTEUS MIRABILIS    Report Status 06/03/2024 FINAL  Final   Organism ID, Bacteria ESCHERICHIA COLI  Final   Organism ID, Bacteria CITROBACTER KOSERI  Final   Organism ID, Bacteria PROTEUS MIRABILIS  Final   Organism ID, Bacteria ENTEROCOCCUS FAECALIS  Final      Susceptibility   Citrobacter koseri - MIC*    CEFEPIME  <=0.12 SENSITIVE Sensitive     ERTAPENEM <=0.12 SENSITIVE Sensitive     CEFTRIAXONE  <=0.25 SENSITIVE Sensitive     CIPROFLOXACIN  <=0.06 SENSITIVE Sensitive     GENTAMICIN <=1 SENSITIVE Sensitive     MEROPENEM <=0.25 SENSITIVE Sensitive     TRIMETH /SULFA  <=20 SENSITIVE Sensitive     PIP/TAZO Value in next row Sensitive      <=4 SENSITIVEThis is a modified FDA-approved test that has been validated and its performance characteristics determined by the reporting laboratory.  This laboratory is certified under the Clinical Laboratory Improvement Amendments CLIA as qualified to perform high complexity clinical laboratory testing.    * ABUNDANT CITROBACTER KOSERI   Escherichia coli - MIC*    AMPICILLIN Value in next row Sensitive      <=4 SENSITIVEThis is a modified FDA-approved test that has been validated and its performance characteristics determined by the reporting laboratory.  This laboratory is certified under the Clinical Laboratory Improvement Amendments CLIA as qualified to perform high complexity clinical laboratory testing.    CEFAZOLIN  (NON-URINE) Value in next row Sensitive      <=4 SENSITIVEThis is a modified FDA-approved test that has been validated and its performance characteristics determined by the reporting laboratory.  This laboratory is certified under the Clinical Laboratory Improvement  Amendments CLIA as qualified to perform high complexity clinical laboratory testing.    CEFEPIME  Value in next row Sensitive      <=4 SENSITIVEThis is a modified FDA-approved test that has been validated and its performance characteristics determined by the reporting laboratory.  This laboratory is certified under the Clinical Laboratory Improvement Amendments CLIA as qualified to perform high complexity clinical laboratory testing.    ERTAPENEM Value in next row Sensitive      <=4 SENSITIVEThis is a modified FDA-approved test that has been validated and its performance characteristics determined by the reporting laboratory.  This laboratory is certified under the Clinical Laboratory Improvement Amendments CLIA as qualified to perform high complexity clinical laboratory testing.    CEFTRIAXONE  Value in next row Sensitive      <=4 SENSITIVEThis is a modified FDA-approved test that has been validated and its performance characteristics determined by the reporting laboratory.  This laboratory is certified under the Clinical Laboratory Improvement Amendments CLIA as qualified to perform high complexity clinical laboratory testing.    CIPROFLOXACIN  Value in next row Sensitive      <=4 SENSITIVEThis is a modified FDA-approved test that has been validated and its performance characteristics determined by the reporting laboratory.  This laboratory is certified under the Clinical Laboratory Improvement Amendments  CLIA as qualified to perform high complexity clinical laboratory testing.    GENTAMICIN Value in next row Sensitive      <=4 SENSITIVEThis is a modified FDA-approved test that has been validated and its performance characteristics determined by the reporting laboratory.  This laboratory is certified under the Clinical Laboratory Improvement Amendments CLIA as qualified to perform high complexity clinical laboratory testing.    MEROPENEM Value in next row Sensitive      <=4 SENSITIVEThis is a modified  FDA-approved test that has been validated and its performance characteristics determined by the reporting laboratory.  This laboratory is certified under the Clinical Laboratory Improvement Amendments CLIA as qualified to perform high complexity clinical laboratory testing.    TRIMETH /SULFA  Value in next row Sensitive      <=4 SENSITIVEThis is a modified FDA-approved test that has been validated and its performance characteristics determined by the reporting laboratory.  This laboratory is certified under the Clinical Laboratory Improvement Amendments CLIA as qualified to perform high complexity clinical laboratory testing.    AMPICILLIN/SULBACTAM Value in next row Sensitive      <=4 SENSITIVEThis is a modified FDA-approved test that has been validated and its performance characteristics determined by the reporting laboratory.  This laboratory is certified under the Clinical Laboratory Improvement Amendments CLIA as qualified to perform high complexity clinical laboratory testing.    PIP/TAZO Value in next row Sensitive      <=4 SENSITIVEThis is a modified FDA-approved test that has been validated and its performance characteristics determined by the reporting laboratory.  This laboratory is certified under the Clinical Laboratory Improvement Amendments CLIA as qualified to perform high complexity clinical laboratory testing.    * ABUNDANT ESCHERICHIA COLI   Enterococcus faecalis - MIC*    AMPICILLIN Value in next row Sensitive      <=4 SENSITIVEThis is a modified FDA-approved test that has been validated and its performance characteristics determined by the reporting laboratory.  This laboratory is certified under the Clinical Laboratory Improvement Amendments CLIA as qualified to perform high complexity clinical laboratory testing.    VANCOMYCIN  Value in next row Sensitive      <=4 SENSITIVEThis is a modified FDA-approved test that has been validated and its performance characteristics determined by  the reporting laboratory.  This laboratory is certified under the Clinical Laboratory Improvement Amendments CLIA as qualified to perform high complexity clinical laboratory testing.    GENTAMICIN SYNERGY Value in next row Sensitive      <=4 SENSITIVEThis is a modified FDA-approved test that has been validated and its performance characteristics determined by the reporting laboratory.  This laboratory is certified under the Clinical Laboratory Improvement Amendments CLIA as qualified to perform high complexity clinical laboratory testing.    * ABUNDANT ENTEROCOCCUS FAECALIS   Proteus mirabilis - MIC*    AMPICILLIN Value in next row Sensitive      <=4 SENSITIVEThis is a modified FDA-approved test that has been validated and its performance characteristics determined by the reporting laboratory.  This laboratory is certified under the Clinical Laboratory Improvement Amendments CLIA as qualified to perform high complexity clinical laboratory testing.    CEFAZOLIN  (NON-URINE) Value in next row Intermediate      <=4 SENSITIVEThis is a modified FDA-approved test that has been validated and its performance characteristics determined by the reporting laboratory.  This laboratory is certified under the Clinical Laboratory Improvement Amendments CLIA as qualified to perform high complexity clinical laboratory testing.    CEFEPIME  Value in next row Sensitive      <=  4 SENSITIVEThis is a modified FDA-approved test that has been validated and its performance characteristics determined by the reporting laboratory.  This laboratory is certified under the Clinical Laboratory Improvement Amendments CLIA as qualified to perform high complexity clinical laboratory testing.    ERTAPENEM Value in next row Sensitive      <=4 SENSITIVEThis is a modified FDA-approved test that has been validated and its performance characteristics determined by the reporting laboratory.  This laboratory is certified under the Clinical Laboratory  Improvement Amendments CLIA as qualified to perform high complexity clinical laboratory testing.    CEFTRIAXONE  Value in next row Sensitive      <=4 SENSITIVEThis is a modified FDA-approved test that has been validated and its performance characteristics determined by the reporting laboratory.  This laboratory is certified under the Clinical Laboratory Improvement Amendments CLIA as qualified to perform high complexity clinical laboratory testing.    CIPROFLOXACIN  Value in next row Sensitive      <=4 SENSITIVEThis is a modified FDA-approved test that has been validated and its performance characteristics determined by the reporting laboratory.  This laboratory is certified under the Clinical Laboratory Improvement Amendments CLIA as qualified to perform high complexity clinical laboratory testing.    GENTAMICIN Value in next row Sensitive      <=4 SENSITIVEThis is a modified FDA-approved test that has been validated and its performance characteristics determined by the reporting laboratory.  This laboratory is certified under the Clinical Laboratory Improvement Amendments CLIA as qualified to perform high complexity clinical laboratory testing.    MEROPENEM Value in next row Sensitive      <=4 SENSITIVEThis is a modified FDA-approved test that has been validated and its performance characteristics determined by the reporting laboratory.  This laboratory is certified under the Clinical Laboratory Improvement Amendments CLIA as qualified to perform high complexity clinical laboratory testing.    TRIMETH /SULFA  Value in next row Sensitive      <=4 SENSITIVEThis is a modified FDA-approved test that has been validated and its performance characteristics determined by the reporting laboratory.  This laboratory is certified under the Clinical Laboratory Improvement Amendments CLIA as qualified to perform high complexity clinical laboratory testing.    AMPICILLIN/SULBACTAM Value in next row Sensitive      <=4  SENSITIVEThis is a modified FDA-approved test that has been validated and its performance characteristics determined by the reporting laboratory.  This laboratory is certified under the Clinical Laboratory Improvement Amendments CLIA as qualified to perform high complexity clinical laboratory testing.    PIP/TAZO Value in next row Sensitive      <=4 SENSITIVEThis is a modified FDA-approved test that has been validated and its performance characteristics determined by the reporting laboratory.  This laboratory is certified under the Clinical Laboratory Improvement Amendments CLIA as qualified to perform high complexity clinical laboratory testing.    * FEW PROTEUS MIRABILIS    Coagulation Studies: No results for input(s): LABPROT, INR in the last 72 hours.  Urinalysis: No results for input(s): COLORURINE, LABSPEC, PHURINE, GLUCOSEU, HGBUR, BILIRUBINUR, KETONESUR, PROTEINUR, UROBILINOGEN, NITRITE, LEUKOCYTESUR in the last 72 hours.  Invalid input(s): APPERANCEUR     Imaging: DG Abd 1 View Result Date: 06/03/2024 CLINICAL DATA:  Projectile vomiting EXAM: ABDOMEN - 1 VIEW COMPARISON:  Two days ago FINDINGS: The bowel gas pattern is normal. Stable right-sided nephrostomy. Left-sided surgical clips are noted. IMPRESSION: No abnormal bowel dilatation. Electronically Signed   By: Lynwood Landy Raddle M.D.   On: 06/03/2024 16:40      Medications:  sodium chloride  10 mL/hr at 06/05/24 0444   cefTRIAXone  (ROCEPHIN )  IV 2 g (06/05/24 9147)    sodium chloride    Intravenous Once   atorvastatin   40 mg Oral Daily   calcium  acetate  1,334 mg Oral TID WC   Chlorhexidine  Gluconate Cloth  6 each Topical Q0600   epoetin  alfa-epbx (RETACRIT ) injection  10,000 Units Intravenous Q M,W,F-1800   feeding supplement (NEPRO CARB STEADY)  237 mL Oral TID BM   heparin   5,000 Units Subcutaneous Q8H   lactulose   30 g Oral Once   levothyroxine   50 mcg Oral Daily   lidocaine -prilocaine   1  Application Topical Once per day on Monday Wednesday Friday   metoprolol  succinate  25 mg Oral BID   midodrine   5 mg Oral TID WC   multivitamin  1 tablet Oral QHS   mouth rinse  15 mL Mouth Rinse 4 times per day   pantoprazole  (PROTONIX ) IV  40 mg Intravenous Q12H   sodium chloride  flush  3 mL Intravenous Q12H   acetaminophen  **OR** acetaminophen , diphenhydrAMINE , famotidine , HYDROmorphone  (DILAUDID ) injection, ipratropium-albuterol , methylPREDNISolone  (SOLU-MEDROL ) injection, midazolam , mouth rinse, mouth rinse, oxyCODONE   Assessment/ Plan:  Larry Morrison is a 54 y.o.  male with past medical conditions including tobacco abuse, anemia, hypertension, TBI, malnutrition, combined heart failure, cocaine abuse, bilateral nephrostomy tubes and end-stage renal disease on hemodialysis.  Patient presents to the emergency department for shortness of breath and has been admitted for Shortness of breath [R06.02] General weakness [R53.1] ESRD on dialysis (HCC) [N18.6, Z99.2] Nephrostomy complication [N99.528] Sepsis (HCC) [A41.9] Elevated procalcitonin [R79.89]    End-stage renal disease on hemodialysis.  Patient received dialysis yesterday with UF 1 L achieved.  Next treatment scheduled for Friday  2.  Sepsis with E. Coli and Citrobacter species growing from urine.  With hypotension requiring vasopressors.  IR performed nephrostomy exchange this admission - ceftriaxone  2 g daily   3. Anemia of chronic kidney disease Lab Results  Component Value Date   HGB 11.3 (L) 06/05/2024  Hemoglobin within optimal range. No need for ESA's at this time.  4. Secondary Hyperparathyroidism: with outpatient labs: PTH 438, phosphorus 5.9, calcium  8.3 on 04/28/24.   Lab Results  Component Value Date   CALCIUM  8.5 (L) 06/04/2024   CAION 0.79 (LL) 09/23/2021   PHOS 4.2 06/04/2024    Patient prescribed calcitriol  and calcium  acetate outpatient.  Bone minerals remain acceptable.  5.  Hypotensive with  history of hypertension with chronic kidney disease.  Home regimen includes carvedilol  and irbesartan  being held - weaned off vasopressor: norepinephrine .  - Continue midodrine  and metoprolol    LOS: 8 Treyshon Buchanon 1/15/202612:57 PM   "

## 2024-06-05 NOTE — Progress Notes (Signed)
 "    Larry Copping, MD [facility]   [address], [address] [place], [place] [address] Phone: [phone] Fax : [phone]   Subjective: The patient was supposed to undergo a EGD yesterday but refused the procedure.  He refused to the transport taken back to the room.  I went to see him again today and reiterated the reason we were planning to do the upper endoscopy.  He states that he does not want any procedures done that are going down his throat.   Objective: Vital signs in last 24 hours: Vitals:   06/05/24 0515 06/05/24 1000 06/05/24 1100 06/05/24 1200  BP:  124/80 135/83 121/74  Pulse:  75 77 69  Resp:  (!) 28 15 (!) 24  Temp:      TempSrc:      SpO2:  98% 97% 100%  Weight: 53.7 kg     Height:       Weight change: 3.1 kg  Intake/Output Summary (Last 24 hours) at 06/05/2024 1408 Last data filed at 06/05/2024 0444 Gross per 24 hour  Intake 1227.05 ml  Output --  Net 1227.05 ml     Exam: General: Patient laying in bed in no apparent distress   Lab Results: @LABTEST2 @ Micro Results: Recent Results (from the past 240 hours)  Resp panel by RT-PCR (RSV, Flu A&B, Covid) Anterior Nasal Swab     Status: None   Collection Time: 05/28/24 10:07 AM   Specimen: Anterior Nasal Swab  Result Value Ref Range Status   SARS Coronavirus 2 by RT PCR NEGATIVE NEGATIVE Final    Comment: (NOTE) SARS-CoV-2 target nucleic acids are NOT DETECTED.  The SARS-CoV-2 RNA is generally detectable in upper respiratory specimens during the acute phase of infection. The lowest concentration of SARS-CoV-2 viral copies this assay can detect is 138 copies/mL. A negative result does not preclude SARS-Cov-2 infection and should not be used as the sole basis for treatment or other patient management decisions. A negative result may occur with  improper specimen collection/handling, submission of specimen other than nasopharyngeal swab, presence of viral mutation(s) within the areas targeted by this  assay, and inadequate number of viral copies(<138 copies/mL). A negative result must be combined with clinical observations, patient history, and epidemiological information. The expected result is Negative.  Fact Sheet for Patients:  bloggercourse.com  Fact Sheet for Healthcare Providers:  seriousbroker.it  This test is no t yet approved or cleared by the [place]  FDA and  has been authorized for detection and/or diagnosis of SARS-CoV-2 by FDA under an Emergency Use Authorization (EUA). This EUA will remain  in effect (meaning this test can be used) for the duration of the COVID-19 declaration under Section 564(b)(1) of the Act, 21 U.S.C.section 360bbb-3(b)(1), unless the authorization is terminated  or revoked sooner.       Influenza A by PCR NEGATIVE NEGATIVE Final   Influenza B by PCR NEGATIVE NEGATIVE Final    Comment: (NOTE) The Xpert Xpress SARS-CoV-2/FLU/RSV plus assay is intended as an aid in the diagnosis of influenza from Nasopharyngeal swab specimens and should not be used as a sole basis for treatment. Nasal washings and aspirates are unacceptable for Xpert Xpress SARS-CoV-2/FLU/RSV testing.  Fact Sheet for Patients: bloggercourse.com  Fact Sheet for Healthcare Providers: seriousbroker.it  This test is not yet approved or cleared by the [place]  FDA and has been authorized for detection and/or diagnosis of SARS-CoV-2 by FDA under an Emergency Use Authorization (EUA). This EUA will remain in effect (meaning  this test can be used) for the duration of the COVID-19 declaration under Section 564(b)(1) of the Act, 21 U.S.C. section 360bbb-3(b)(1), unless the authorization is terminated or revoked.     Resp Syncytial Virus by PCR NEGATIVE NEGATIVE Final    Comment: (NOTE) Fact Sheet for Patients: bloggercourse.com  Fact Sheet for  Healthcare Providers: seriousbroker.it  This test is not yet approved or cleared by the [place]  FDA and has been authorized for detection and/or diagnosis of SARS-CoV-2 by FDA under an Emergency Use Authorization (EUA). This EUA will remain in effect (meaning this test can be used) for the duration of the COVID-19 declaration under Section 564(b)(1) of the Act, 21 U.S.C. section 360bbb-3(b)(1), unless the authorization is terminated or revoked.  Performed at [facility], [address] [place], [place] [address]   Respiratory (~20 pathogens) panel by PCR     Status: None   Collection Time: 05/28/24 10:07 AM   Specimen: Nasopharyngeal Swab; Respiratory  Result Value Ref Range Status   Adenovirus NOT DETECTED NOT DETECTED Final   Coronavirus 229E NOT DETECTED NOT DETECTED Final    Comment: (NOTE) The Coronavirus on the Respiratory Panel, DOES NOT test for the novel  Coronavirus (2019 nCoV)    Coronavirus HKU1 NOT DETECTED NOT DETECTED Final   Coronavirus NL63 NOT DETECTED NOT DETECTED Final   Coronavirus OC43 NOT DETECTED NOT DETECTED Final   Metapneumovirus NOT DETECTED NOT DETECTED Final   Rhinovirus / Enterovirus NOT DETECTED NOT DETECTED Final   Influenza A NOT DETECTED NOT DETECTED Final   Influenza B NOT DETECTED NOT DETECTED Final   Parainfluenza Virus 1 NOT DETECTED NOT DETECTED Final   Parainfluenza Virus 2 NOT DETECTED NOT DETECTED Final   Parainfluenza Virus 3 NOT DETECTED NOT DETECTED Final   Parainfluenza Virus 4 NOT DETECTED NOT DETECTED Final   Respiratory Syncytial Virus NOT DETECTED NOT DETECTED Final   Bordetella pertussis NOT DETECTED NOT DETECTED Final   Bordetella Parapertussis NOT DETECTED NOT DETECTED Final   Chlamydophila pneumoniae NOT DETECTED NOT DETECTED Final   Mycoplasma pneumoniae NOT DETECTED NOT DETECTED Final    Comment: Performed at [facility], [address] [place], [place] [address]  Blood  culture (routine x 2)     Status: None   Collection Time: 05/28/24 12:52 PM   Specimen: BLOOD  Result Value Ref Range Status   Specimen Description BLOOD RIGHT ANTECUBITAL  Final   Special Requests   Final    BOTTLES DRAWN AEROBIC AND ANAEROBIC Blood Culture results may not be optimal due to an inadequate volume of blood received in culture bottles   Culture   Final    NO GROWTH 5 DAYS Performed at [facility], [address] [place], [place] [address]    Report Status 06/02/2024 FINAL  Final  Urine Culture     Status: Abnormal   Collection Time: 05/28/24 12:52 PM   Specimen: Urine, Catheterized  Result Value Ref Range Status   Specimen Description   Final    URINE, CATHETERIZED Performed at [facility], [address], [place], [place] [address]    Special Requests   Final    NONE Performed at [facility], [address] [place], [place] [address]    Culture (A)  Final    >=100,000 COLONIES/mL ESCHERICHIA COLI >=100,000 COLONIES/mL CITROBACTER KOSERI    Report Status 05/31/2024 FINAL  Final   Organism ID, Bacteria ESCHERICHIA COLI (A)  Final   Organism ID,  Bacteria CITROBACTER KOSERI (A)  Final      Susceptibility   Citrobacter koseri - MIC*    CEFEPIME  <=0.12 SENSITIVE Sensitive     ERTAPENEM <=0.12 SENSITIVE Sensitive     CEFTRIAXONE  <=0.25 SENSITIVE Sensitive     CIPROFLOXACIN  <=0.06 SENSITIVE Sensitive     GENTAMICIN <=1 SENSITIVE Sensitive     NITROFURANTOIN  32 SENSITIVE Sensitive     TRIMETH /SULFA  <=20 SENSITIVE Sensitive     PIP/TAZO Value in next row Sensitive      <=4 SENSITIVEThis is a modified FDA-approved test that has been validated and its performance characteristics determined by the reporting laboratory.  This laboratory is certified under the Clinical Laboratory Improvement Amendments CLIA as qualified to perform high complexity clinical laboratory testing.    MEROPENEM Value in next row Sensitive      <=4 SENSITIVEThis  is a modified FDA-approved test that has been validated and its performance characteristics determined by the reporting laboratory.  This laboratory is certified under the Clinical Laboratory Improvement Amendments CLIA as qualified to perform high complexity clinical laboratory testing.    * >=100,000 COLONIES/mL CITROBACTER KOSERI   Escherichia coli - MIC*    AMPICILLIN Value in next row Sensitive      <=4 SENSITIVEThis is a modified FDA-approved test that has been validated and its performance characteristics determined by the reporting laboratory.  This laboratory is certified under the Clinical Laboratory Improvement Amendments CLIA as qualified to perform high complexity clinical laboratory testing.    CEFAZOLIN  (URINE) Value in next row Sensitive      2 SENSITIVEThis is a modified FDA-approved test that has been validated and its performance characteristics determined by the reporting laboratory.  This laboratory is certified under the Clinical Laboratory Improvement Amendments CLIA as qualified to perform high complexity clinical laboratory testing.    CEFEPIME  Value in next row Sensitive      2 SENSITIVEThis is a modified FDA-approved test that has been validated and its performance characteristics determined by the reporting laboratory.  This laboratory is certified under the Clinical Laboratory Improvement Amendments CLIA as qualified to perform high complexity clinical laboratory testing.    ERTAPENEM Value in next row Sensitive      2 SENSITIVEThis is a modified FDA-approved test that has been validated and its performance characteristics determined by the reporting laboratory.  This laboratory is certified under the Clinical Laboratory Improvement Amendments CLIA as qualified to perform high complexity clinical laboratory testing.    CEFTRIAXONE  Value in next row Sensitive      2 SENSITIVEThis is a modified FDA-approved test that has been validated and its performance characteristics  determined by the reporting laboratory.  This laboratory is certified under the Clinical Laboratory Improvement Amendments CLIA as qualified to perform high complexity clinical laboratory testing.    CIPROFLOXACIN  Value in next row Sensitive      2 SENSITIVEThis is a modified FDA-approved test that has been validated and its performance characteristics determined by the reporting laboratory.  This laboratory is certified under the Clinical Laboratory Improvement Amendments CLIA as qualified to perform high complexity clinical laboratory testing.    GENTAMICIN Value in next row Sensitive      2 SENSITIVEThis is a modified FDA-approved test that has been validated and its performance characteristics determined by the reporting laboratory.  This laboratory is certified under the Clinical Laboratory Improvement Amendments CLIA as qualified to perform high complexity clinical laboratory testing.    NITROFURANTOIN  Value in next row Sensitive  2 SENSITIVEThis is a modified FDA-approved test that has been validated and its performance characteristics determined by the reporting laboratory.  This laboratory is certified under the Clinical Laboratory Improvement Amendments CLIA as qualified to perform high complexity clinical laboratory testing.    TRIMETH /SULFA  Value in next row Sensitive      2 SENSITIVEThis is a modified FDA-approved test that has been validated and its performance characteristics determined by the reporting laboratory.  This laboratory is certified under the Clinical Laboratory Improvement Amendments CLIA as qualified to perform high complexity clinical laboratory testing.    AMPICILLIN/SULBACTAM Value in next row Sensitive      2 SENSITIVEThis is a modified FDA-approved test that has been validated and its performance characteristics determined by the reporting laboratory.  This laboratory is certified under the Clinical Laboratory Improvement Amendments CLIA as qualified to perform high  complexity clinical laboratory testing.    PIP/TAZO Value in next row Sensitive      <=4 SENSITIVEThis is a modified FDA-approved test that has been validated and its performance characteristics determined by the reporting laboratory.  This laboratory is certified under the Clinical Laboratory Improvement Amendments CLIA as qualified to perform high complexity clinical laboratory testing.    MEROPENEM Value in next row Sensitive      <=4 SENSITIVEThis is a modified FDA-approved test that has been validated and its performance characteristics determined by the reporting laboratory.  This laboratory is certified under the Clinical Laboratory Improvement Amendments CLIA as qualified to perform high complexity clinical laboratory testing.    * >=100,000 COLONIES/mL ESCHERICHIA COLI  Expectorated Sputum Assessment w Gram Stain, Rflx to Resp Cult     Status: None   Collection Time: 05/28/24  1:18 PM   Specimen: Salivary Gland; Sputum  Result Value Ref Range Status   Specimen Description SALIVA  Final   Special Requests EXPSU  Final   Sputum evaluation   Final    THIS SPECIMEN IS ACCEPTABLE FOR SPUTUM CULTURE Performed at [facility], [address] [place], [place] [address]    Report Status 05/29/2024 FINAL  Final  Culture, Respiratory w Gram Stain     Status: None   Collection Time: 05/28/24  1:18 PM  Result Value Ref Range Status   Specimen Description EXPECTORATED SPUTUM  Final   Special Requests Reflexed from [id number]  Final   Gram Stain   Final    FEW WBC PRESENT, PREDOMINANTLY PMN RARE GRAM NEGATIVE RODS Performed at [facility], [address] [place], [place] [address]    Culture RARE CANDIDA ALBICANS  Final   Report Status 06/01/2024 FINAL  Final  MRSA Next Gen by PCR, Nasal     Status: None   Collection Time: 05/28/24  9:40 PM   Specimen: Nasal Mucosa; Nasal Swab  Result Value Ref Range Status   MRSA by PCR Next Gen NOT DETECTED NOT DETECTED Final    Comment:  (NOTE) The GeneXpert MRSA Assay (FDA approved for NASAL specimens only), is one component of a comprehensive MRSA colonization surveillance program. It is not intended to diagnose MRSA infection nor to guide or monitor treatment for MRSA infections. Test performance is not FDA approved in patients less than 23 years old. Performed at [facility], [address] [place], [place] [address]   Blood culture (routine x 2)     Status: None   Collection Time: 05/28/24 10:24 PM   Specimen: BLOOD  Result Value Ref Range Status   Specimen Description BLOOD  BLOOD RIGHT HAND  Final   Special Requests   Final    BOTTLES DRAWN AEROBIC ONLY Blood Culture results may not be optimal due to an inadequate volume of blood received in culture bottles   Culture   Final    NO GROWTH 5 DAYS Performed at [facility], [address], [place], [place] [address]    Report Status 06/02/2024 FINAL  Final  Body fluid culture w Gram Stain     Status: None   Collection Time: 05/29/24  8:00 AM   Specimen: Other Source; Body Fluid  Result Value Ref Range Status   Specimen Description   Final    OTHER Performed at [facility], [address] [place], [place] [address]    Special Requests   Final    NONE Performed at [facility], [address] [place], [place] [address]    Gram Stain   Final    FEW WBC PRESENT, PREDOMINANTLY PMN MODERATE GRAM POSITIVE COCCI FEW GRAM NEGATIVE RODS Performed at [facility], [address], [place], [place] [address]    Culture   Final    ABUNDANT ENTEROCOCCUS FAECALIS ABUNDANT ESCHERICHIA COLI ABUNDANT CITROBACTER KOSERI FEW PROTEUS MIRABILIS    Report Status 06/03/2024 FINAL  Final   Organism ID, Bacteria ESCHERICHIA COLI  Final   Organism ID, Bacteria CITROBACTER KOSERI  Final   Organism ID, Bacteria PROTEUS MIRABILIS  Final   Organism ID, Bacteria ENTEROCOCCUS FAECALIS  Final      Susceptibility   Citrobacter  koseri - MIC*    CEFEPIME  <=0.12 SENSITIVE Sensitive     ERTAPENEM <=0.12 SENSITIVE Sensitive     CEFTRIAXONE  <=0.25 SENSITIVE Sensitive     CIPROFLOXACIN  <=0.06 SENSITIVE Sensitive     GENTAMICIN <=1 SENSITIVE Sensitive     MEROPENEM <=0.25 SENSITIVE Sensitive     TRIMETH /SULFA  <=20 SENSITIVE Sensitive     PIP/TAZO Value in next row Sensitive      <=4 SENSITIVEThis is a modified FDA-approved test that has been validated and its performance characteristics determined by the reporting laboratory.  This laboratory is certified under the Clinical Laboratory Improvement Amendments CLIA as qualified to perform high complexity clinical laboratory testing.    * ABUNDANT CITROBACTER KOSERI   Escherichia coli - MIC*    AMPICILLIN Value in next row Sensitive      <=4 SENSITIVEThis is a modified FDA-approved test that has been validated and its performance characteristics determined by the reporting laboratory.  This laboratory is certified under the Clinical Laboratory Improvement Amendments CLIA as qualified to perform high complexity clinical laboratory testing.    CEFAZOLIN  (NON-URINE) Value in next row Sensitive      <=4 SENSITIVEThis is a modified FDA-approved test that has been validated and its performance characteristics determined by the reporting laboratory.  This laboratory is certified under the Clinical Laboratory Improvement Amendments CLIA as qualified to perform high complexity clinical laboratory testing.    CEFEPIME  Value in next row Sensitive      <=4 SENSITIVEThis is a modified FDA-approved test that has been validated and its performance characteristics determined by the reporting laboratory.  This laboratory is certified under the Clinical Laboratory Improvement Amendments CLIA as qualified to perform high complexity clinical laboratory testing.    ERTAPENEM Value in next row Sensitive      <=4 SENSITIVEThis is a modified FDA-approved test that has been validated and its performance  characteristics determined by the reporting laboratory.  This laboratory is certified under  the Clinical Laboratory Improvement Amendments CLIA as qualified to perform high complexity clinical laboratory testing.    CEFTRIAXONE  Value in next row Sensitive      <=4 SENSITIVEThis is a modified FDA-approved test that has been validated and its performance characteristics determined by the reporting laboratory.  This laboratory is certified under the Clinical Laboratory Improvement Amendments CLIA as qualified to perform high complexity clinical laboratory testing.    CIPROFLOXACIN  Value in next row Sensitive      <=4 SENSITIVEThis is a modified FDA-approved test that has been validated and its performance characteristics determined by the reporting laboratory.  This laboratory is certified under the Clinical Laboratory Improvement Amendments CLIA as qualified to perform high complexity clinical laboratory testing.    GENTAMICIN Value in next row Sensitive      <=4 SENSITIVEThis is a modified FDA-approved test that has been validated and its performance characteristics determined by the reporting laboratory.  This laboratory is certified under the Clinical Laboratory Improvement Amendments CLIA as qualified to perform high complexity clinical laboratory testing.    MEROPENEM Value in next row Sensitive      <=4 SENSITIVEThis is a modified FDA-approved test that has been validated and its performance characteristics determined by the reporting laboratory.  This laboratory is certified under the Clinical Laboratory Improvement Amendments CLIA as qualified to perform high complexity clinical laboratory testing.    TRIMETH /SULFA  Value in next row Sensitive      <=4 SENSITIVEThis is a modified FDA-approved test that has been validated and its performance characteristics determined by the reporting laboratory.  This laboratory is certified under the Clinical Laboratory Improvement Amendments CLIA as qualified to  perform high complexity clinical laboratory testing.    AMPICILLIN/SULBACTAM Value in next row Sensitive      <=4 SENSITIVEThis is a modified FDA-approved test that has been validated and its performance characteristics determined by the reporting laboratory.  This laboratory is certified under the Clinical Laboratory Improvement Amendments CLIA as qualified to perform high complexity clinical laboratory testing.    PIP/TAZO Value in next row Sensitive      <=4 SENSITIVEThis is a modified FDA-approved test that has been validated and its performance characteristics determined by the reporting laboratory.  This laboratory is certified under the Clinical Laboratory Improvement Amendments CLIA as qualified to perform high complexity clinical laboratory testing.    * ABUNDANT ESCHERICHIA COLI   Enterococcus faecalis - MIC*    AMPICILLIN Value in next row Sensitive      <=4 SENSITIVEThis is a modified FDA-approved test that has been validated and its performance characteristics determined by the reporting laboratory.  This laboratory is certified under the Clinical Laboratory Improvement Amendments CLIA as qualified to perform high complexity clinical laboratory testing.    VANCOMYCIN  Value in next row Sensitive      <=4 SENSITIVEThis is a modified FDA-approved test that has been validated and its performance characteristics determined by the reporting laboratory.  This laboratory is certified under the Clinical Laboratory Improvement Amendments CLIA as qualified to perform high complexity clinical laboratory testing.    GENTAMICIN SYNERGY Value in next row Sensitive      <=4 SENSITIVEThis is a modified FDA-approved test that has been validated and its performance characteristics determined by the reporting laboratory.  This laboratory is certified under the Clinical Laboratory Improvement Amendments CLIA as qualified to perform high complexity clinical laboratory testing.    * ABUNDANT ENTEROCOCCUS FAECALIS    Proteus mirabilis - MIC*    AMPICILLIN Value in  next row Sensitive      <=4 SENSITIVEThis is a modified FDA-approved test that has been validated and its performance characteristics determined by the reporting laboratory.  This laboratory is certified under the Clinical Laboratory Improvement Amendments CLIA as qualified to perform high complexity clinical laboratory testing.    CEFAZOLIN  (NON-URINE) Value in next row Intermediate      <=4 SENSITIVEThis is a modified FDA-approved test that has been validated and its performance characteristics determined by the reporting laboratory.  This laboratory is certified under the Clinical Laboratory Improvement Amendments CLIA as qualified to perform high complexity clinical laboratory testing.    CEFEPIME  Value in next row Sensitive      <=4 SENSITIVEThis is a modified FDA-approved test that has been validated and its performance characteristics determined by the reporting laboratory.  This laboratory is certified under the Clinical Laboratory Improvement Amendments CLIA as qualified to perform high complexity clinical laboratory testing.    ERTAPENEM Value in next row Sensitive      <=4 SENSITIVEThis is a modified FDA-approved test that has been validated and its performance characteristics determined by the reporting laboratory.  This laboratory is certified under the Clinical Laboratory Improvement Amendments CLIA as qualified to perform high complexity clinical laboratory testing.    CEFTRIAXONE  Value in next row Sensitive      <=4 SENSITIVEThis is a modified FDA-approved test that has been validated and its performance characteristics determined by the reporting laboratory.  This laboratory is certified under the Clinical Laboratory Improvement Amendments CLIA as qualified to perform high complexity clinical laboratory testing.    CIPROFLOXACIN  Value in next row Sensitive      <=4 SENSITIVEThis is a modified FDA-approved test that has been validated and  its performance characteristics determined by the reporting laboratory.  This laboratory is certified under the Clinical Laboratory Improvement Amendments CLIA as qualified to perform high complexity clinical laboratory testing.    GENTAMICIN Value in next row Sensitive      <=4 SENSITIVEThis is a modified FDA-approved test that has been validated and its performance characteristics determined by the reporting laboratory.  This laboratory is certified under the Clinical Laboratory Improvement Amendments CLIA as qualified to perform high complexity clinical laboratory testing.    MEROPENEM Value in next row Sensitive      <=4 SENSITIVEThis is a modified FDA-approved test that has been validated and its performance characteristics determined by the reporting laboratory.  This laboratory is certified under the Clinical Laboratory Improvement Amendments CLIA as qualified to perform high complexity clinical laboratory testing.    TRIMETH /SULFA  Value in next row Sensitive      <=4 SENSITIVEThis is a modified FDA-approved test that has been validated and its performance characteristics determined by the reporting laboratory.  This laboratory is certified under the Clinical Laboratory Improvement Amendments CLIA as qualified to perform high complexity clinical laboratory testing.    AMPICILLIN/SULBACTAM Value in next row Sensitive      <=4 SENSITIVEThis is a modified FDA-approved test that has been validated and its performance characteristics determined by the reporting laboratory.  This laboratory is certified under the Clinical Laboratory Improvement Amendments CLIA as qualified to perform high complexity clinical laboratory testing.    PIP/TAZO Value in next row Sensitive      <=4 SENSITIVEThis is a modified FDA-approved test that has been validated and its performance characteristics determined by the reporting laboratory.  This laboratory is certified under the Clinical Laboratory Improvement Amendments CLIA  as qualified to perform high complexity clinical  laboratory testing.    * FEW PROTEUS MIRABILIS   Studies/Results: No results found. Medications: I have reviewed the patient's current medications. Scheduled Meds:  sodium chloride    Intravenous Once   atorvastatin   40 mg Oral Daily   calcium  acetate  1,334 mg Oral TID WC   Chlorhexidine  Gluconate Cloth  6 each Topical Q0600   epoetin  alfa-epbx (RETACRIT ) injection  10,000 Units Intravenous Q M,W,F-1800   feeding supplement  1 Container Oral TID BM   heparin   5,000 Units Subcutaneous Q8H   lactulose   30 g Oral Once   levothyroxine   50 mcg Oral Daily   lidocaine -prilocaine   1 Application Topical Once per day on Monday Wednesday Friday   metoprolol  succinate  25 mg Oral BID   midodrine   5 mg Oral TID WC   multivitamin  1 tablet Oral QHS   mouth rinse  15 mL Mouth Rinse 4 times per day   pantoprazole  (PROTONIX ) IV  40 mg Intravenous Q12H   sodium chloride  flush  3 mL Intravenous Q12H   [START ON 06/06/2024] thiamine   100 mg Oral Daily   Continuous Infusions:  sodium chloride  10 mL/hr at 06/05/24 0444   cefTRIAXone  (ROCEPHIN )  IV 2 g (06/05/24 0852)   PRN Meds:.acetaminophen  **OR** acetaminophen , diphenhydrAMINE , famotidine , HYDROmorphone  (DILAUDID ) injection, ipratropium-albuterol , methylPREDNISolone  (SOLU-MEDROL ) injection, midazolam , mouth rinse, mouth rinse, oxyCODONE    Assessment: Principal Problem:   Septic shock (HCC) Active Problems:   Hypertension   Protein calorie malnutrition   Shortness of breath   End stage renal disease (HCC)   HFrEF (heart failure with reduced ejection fraction) (HCC)   New onset a-fib (HCC)   Hypotension   Nausea and vomiting   Anemia of chronic renal failure   Goals of care, counseling/discussion   Hematemesis without nausea   Nephrostomy complication   General weakness   Complicated UTI (urinary tract infection)   Protein-calorie malnutrition, severe   ESRD on dialysis [facility])   Medical  orders for scope of treatment form in chart    Plan: The patient was set up for an EGD but he has refused the EGD.  The patient had some vomiting and hematemesis but appears to be tolerating his diet now.  The patient refused to undergo any luminal evaluation.  Nothing further to do from a GI point of view.  I will sign off.  Please call if any further GI concerns or questions.  We would like to thank you for the opportunity to participate in the care of Winford Finder.    LOS: 8 days   Larry Copping, MD.FACG 06/05/2024, 2:08 PM Pager [phone] 7am-5pm  Check AMION for 5pm -7am coverage and on weekends  "

## 2024-06-05 NOTE — Progress Notes (Signed)
 "  Cardiology Progress Note   Patient Name: Larry Morrison Date of Encounter: 06/05/2024  Primary Cardiologist: Redell Cave, MD  Subjective   Maintaining sinus rhythm.  Denies chest pain or dyspnea.  Notes chronic cough which is nonproductive. Objective   Inpatient Medications    Scheduled Meds:  sodium chloride    Intravenous Once   atorvastatin   40 mg Oral Daily   calcium  acetate  1,334 mg Oral TID WC   Chlorhexidine  Gluconate Cloth  6 each Topical Q0600   epoetin  alfa-epbx (RETACRIT ) injection  10,000 Units Intravenous Q M,W,F-1800   feeding supplement  1 Container Oral TID BM   heparin   5,000 Units Subcutaneous Q8H   lactulose   30 g Oral Once   levothyroxine   50 mcg Oral Daily   lidocaine -prilocaine   1 Application Topical Once per day on Monday Wednesday Friday   metoprolol  succinate  25 mg Oral BID   midodrine   5 mg Oral TID WC   multivitamin  1 tablet Oral QHS   mouth rinse  15 mL Mouth Rinse 4 times per day   pantoprazole   40 mg Oral BID   sodium chloride  flush  3 mL Intravenous Q12H   [START ON 06/06/2024] thiamine   100 mg Oral Daily   Continuous Infusions:  sodium chloride  10 mL/hr at 06/05/24 0444   cefTRIAXone  (ROCEPHIN )  IV 2 g (06/05/24 0852)   PRN Meds: acetaminophen  **OR** acetaminophen , diphenhydrAMINE , famotidine , HYDROmorphone  (DILAUDID ) injection, ipratropium-albuterol , methylPREDNISolone  (SOLU-MEDROL ) injection, midazolam , mouth rinse, mouth rinse, oxyCODONE    Vital Signs    Vitals:   06/05/24 0515 06/05/24 1000 06/05/24 1100 06/05/24 1200  BP:  124/80 135/83 121/74  Pulse:  75 77 69  Resp:  (!) 28 15 (!) 24  Temp:      TempSrc:      SpO2:  98% 97% 100%  Weight: 53.7 kg     Height:        Intake/Output Summary (Last 24 hours) at 06/05/2024 1741 Last data filed at 06/05/2024 1300 Gross per 24 hour  Intake 1127.05 ml  Output --  Net 1127.05 ml   Filed Weights   06/04/24 0810 06/04/24 1300 06/05/24 0515  Weight: 50.2 kg 49.2 kg 53.7 kg     Physical Exam   GEN: Well nourished, well developed, in no acute distress.  HEENT: Grossly normal.  Neck: Supple, no JVD, carotid bruits, or masses. Cardiac: RRR, no murmurs, rubs, or gallops. No clubbing, cyanosis, edema.  Radials 2+, DP/PT 2+ and equal bilaterally.  Respiratory:  Respirations regular and unlabored, clear to auscultation bilaterally. GI: Soft, nontender, nondistended, BS + x 4. MS: no deformity or atrophy. Skin: warm and dry, no rash. Neuro:  Strength and sensation are intact. Psych: AAOx3.  Normal affect.  Labs    Chemistry Recent Labs  Lab 06/01/24 0323 06/02/24 0537 06/03/24 0551 06/04/24 0830  NA 135 140 141 137  K 4.1 3.6 3.5 3.2*  CL 93* 92* 94* 96*  CO2 21* 23 20* 26  GLUCOSE 60* 76 80 67*  BUN 32* 46* 59* 21*  CREATININE 8.45* 11.50* 13.90* 7.04*  CALCIUM  9.0 9.2 9.3 8.5*  PROT  --   --  7.9  --   ALBUMIN  2.9*  --  3.0* 2.7*  AST  --   --  24  --   ALT  --   --  24  --   ALKPHOS  --   --  67  --   BILITOT  --   --  0.4  --   GFRNONAA 7* 5* 4* 9*  ANIONGAP 22* 25* 27* 15     Hematology Recent Labs  Lab 06/02/24 0537 06/03/24 0551 06/03/24 1117 06/03/24 2344 06/04/24 0830 06/05/24 0008  WBC 15.1* 10.7*  --   --  8.6  --   RBC 2.53* 2.40*  --   --  2.77*  --   HGB 8.1* 7.9*   < > 7.6* 8.5* 11.3*  HCT 27.1* 25.6*   < > 24.5* 27.4* 34.6*  MCV 107.1* 106.7*  --   --  98.9  --   MCH 32.0 32.9  --   --  30.7  --   MCHC 29.9* 30.9  --   --  31.0  --   RDW 14.3 14.3  --   --  18.0*  --   PLT 220 261  --   --  207  --    < > = values in this interval not displayed.    BNP    Component Value Date/Time   BNP >4,500.0 (H) 09/23/2021 0951    ProBNP    Component Value Date/Time   PROBNP 6,557.0 (H) 05/29/2024 0621    Lipids  Lab Results  Component Value Date   CHOL 99 09/24/2021   HDL 44 09/24/2021   LDLCALC 42 09/24/2021   TRIG 67 09/24/2021   CHOLHDL 2.3 09/24/2021    HbA1c  Lab Results  Component Value Date    HGBA1C 5.3 09/23/2021    Radiology    DG Abd 1 View Result Date: 06/03/2024 CLINICAL DATA:  Projectile vomiting EXAM: ABDOMEN - 1 VIEW COMPARISON:  Two days ago FINDINGS: The bowel gas pattern is normal. Stable right-sided nephrostomy. Left-sided surgical clips are noted. IMPRESSION: No abnormal bowel dilatation. Electronically Signed   By: Lynwood Landy Raddle M.D.   On: 06/03/2024 16:40   IR NEPHROSTOMY EXCHANGE RIGHT Result Date: 06/02/2024 INDICATION: Chronic indwelling right nephrostomy, routine exchange EXAM: FLUOROSCOPIC EXCHANGE OF THE RIGHT 10 FRENCH NEPHROSTOMY COMPARISON:  None Available. MEDICATIONS: 1% lidocaine  local ANESTHESIA/SEDATION: Moderate (conscious) sedation was employed during this procedure. A total of Versed  1.0 mg and Fentanyl  50 mcg was administered intravenously by the radiology nurse. Total intra-service moderate Sedation Time: 5 minutes. The patient's level of consciousness and vital signs were monitored continuously by radiology nursing throughout the procedure under my direct supervision. CONTRAST:  15 cc-administered into the collecting system(s) FLUOROSCOPY: Radiation Exposure Index (as provided by the fluoroscopic device): 5.0 mGy Kerma COMPLICATIONS: None immediate. PROCEDURE: Informed written consent was obtained from the patient after a thorough discussion of the procedural risks, benefits and alternatives. All questions were addressed. Maximal Sterile Barrier Technique was utilized including caps, mask, sterile gowns, sterile gloves, sterile drape, hand hygiene and skin antiseptic. A timeout was performed prior to the initiation of the procedure. Previous imaging reviewed. Preliminary injection confirms position of the nephrostomy within the renal pelvis. Successful exchange performed over an Amplatz guidewire. Retention loop formed in the renal pelvis. Contrast injection confirms position. Images again obtained for documentation. Patient tolerated the procedure well. No  immediate complication. Catheter secured with a silk suture. Gravity drainage bag connected followed by sterile dressing. No immediate complication. Patient tolerated the procedure well. IMPRESSION: Successful fluoroscopic exchange of the right 10 French nephrostomy Electronically Signed   By: CHRISTELLA.  Shick M.D.   On: 06/02/2024 14:17     Telemetry    Regular sinus rhythm, QTc 590 ms- Personally Reviewed  Cardiac Studies   2D Echocardiogram  1.9.2025  1. Limited views available due to patient cooperation. No apical windows.   2. Left ventricular ejection fraction, by estimation, is 35 to 40%. The  left ventricle has moderately decreased function. Left ventricular  endocardial border not optimally defined to evaluate regional wall motion.  Left ventricular diastolic parameters  are consistent with Grade I diastolic dysfunction (impaired relaxation).   3. Right ventricular systolic function is normal. The right ventricular  size is normal.   4. There is no evidence of pericardial effusion. Unable to exclude an  apical effusion given lack of apical window.   5. The mitral valve is normal in structure. Trivial mitral valve  regurgitation. No evidence of mitral stenosis.   6. The aortic valve has an indeterminant number of cusps. There is mild  calcification of the aortic valve. Aortic valve regurgitation is trivial.  Aortic valve sclerosis/calcification is present, without any evidence of  aortic stenosis.   7. The inferior vena cava is normal in size with greater than 50%  respiratory variability, suggesting right atrial pressure of 3 mmHg.   _____________   Patient Profile     54 y.o. male with a history of chronic HFrEF of uncertain etiology (noncompliant with follow-up), end-stage renal disease on hemodialysis, QT prolongation, hypertension, hyperlipidemia, and indwelling right nephrostomy tube, who was admitted January 7 with sepsis, E. coli bacteremia, and hypotension requiring  vasopressors, and subsequently developed A-fib with RVR.  Assessment & Plan    1.  Atrial fibrillation with rapid ventricular response: Occurred in the setting of sepsis, bacteremia, and hypotension.  He was initially placed on intravenous amiodarone  with subsequent conversion to sinus rhythm.  QTc has remained prolonged in the 592/600 range and as result, we are discontinuing amiodarone .  With soft blood pressures requiring midodrine , will transition from carvedilol  to Toprol -XL 25 mg twice daily.  In the setting of anemia, he has not been anticoagulated up to this point.  CHA2DS2-VASc equals 2.  H&H improved this morning following transfusions this admission.  Can reconsider oral anticoagulation pending stability of anemia.  2.  Chronic HFrEF/cardiomyopathy: Unclear etiology of reduced ejection fraction in the setting of noncompliance with follow-up.  EF 35 to 40% by echo on this admission with grade 1 diastolic dysfunction.  Does not appear to be markedly volume overload on examination.  Volume management per hemodialysis.  GDMT currently limited by soft blood pressures requiring midodrine .  We are transitioning from carvedilol  to metoprolol  succinate in the setting of above.  Will start to wean down midodrine  and if possible, would look to DC if possible..  Unclear at this point if he will tolerate GDMT.  Poor candidate for SGLT2 inhibitor.  3.  Macrocytic anemia: Status posttransfusion with H&H of 11.3/34.6 this morning.  Receiving Retacrit .  Per medicine/nephrology.  Refused EGD.  4.  End-stage renal disease: Hemodialysis per nephrology team.  5.  Sepsis/E. coli bacteremia: Blood pressure stable.  Look to wean midodrine .  Antibiotics per medicine team.  6.  Prolonged QT: QTc 590 this morning.  He was hypokalemic at 3.2 yesterday.  We have asked for repeat basic metabolic panel.  Magnesium  was 2.4 on January 13.  With ongoing prolonged QT, we are discontinuing amiodarone .  Signed, Lonni Meager, NP  06/05/2024, 5:41 PM    For questions or updates, please contact   Please consult [email or web address] for contact info under Cardiology/STEMI.  "

## 2024-06-05 NOTE — Progress Notes (Addendum)
 "                                                                                                                                                                                                          Daily Progress Note   Patient Name: Larry Morrison       Date: 06/05/2024 DOB: Nov 26, 1970  Age: 54 y.o. MRN#: [id number] Attending Physician: Jens Durand, MD Primary Care Physician: [facility] Admit Date: 05/28/2024  Reason for Consultation/Follow-up: Establishing goals of care  Subjective: Notes and labs reviewed.  Per notes patient refused EGD yesterday.  Patient does have DSS legal guardian.  Called to speak with [name] Goble DSS.  Discussed updates.  She states her team is still collecting information in order to make decisions for him.  She states in light of his refusal of EGD, she will have a DSS case worker come and speak with him regarding his wishes on care moving forward.  DSS states she has the phone number to reach PMT, and she states she will call our team if they would like for us  to make changes to his full code/full scope treatment plan, otherwise they will speak with attending team upon updates.  PMT will step away from daily visits and shadow peripherally for decline.  Please reach out if needs arise.  Attending updated.  ADDENDUM: Received message from palliative triage that Alberta Rolling with DSS would like a callback.  Spoke with [name] who states they have talked with the patient, and amongst themselves and they would like to proceed with a DNR status.  She states if patient does not want the EGD she does not want to force this.  Discussed making decisions moving forward based on how patient does and care that he will and will not accept.  I completed a MOST form today with DSS legal guardian [name] Goble via [employer].  Components of the form were discussed. Cardiopulmonary Resuscitation: Do Not Attempt Resuscitation (DNR/No CPR)  Medical Interventions: Full  Scope of Treatment: Use intubation, advanced airway interventions, mechanical ventilation, cardioversion as indicated, medical treatment, IV fluids, etc, also provide comfort measures. Transfer to the hospital if indicated  Antibiotics: Antibiotics if indicated  IV Fluids: IV fluids if indicated  Feeding Tube: Left Blank      Length of Stay: 8  Current Medications: Scheduled Meds:   sodium chloride    Intravenous Once   amiodarone   200 mg Oral Daily   atorvastatin   40 mg Oral Daily   calcium  acetate  1,334 mg Oral TID WC  carvedilol   3.125 mg Oral BID WC   Chlorhexidine  Gluconate Cloth  6 each Topical Q0600   epoetin  alfa-epbx (RETACRIT ) injection  10,000 Units Intravenous Q M,W,F-1800   feeding supplement (NEPRO CARB STEADY)  237 mL Oral TID BM   heparin   5,000 Units Subcutaneous Q8H   lactulose   30 g Oral Once   levothyroxine   50 mcg Oral Daily   lidocaine -prilocaine   1 Application Topical Once per day on Monday Wednesday Friday   midodrine   10 mg Oral TID WC   multivitamin  1 tablet Oral QHS   mouth rinse  15 mL Mouth Rinse 4 times per day   pantoprazole  (PROTONIX ) IV  40 mg Intravenous Q12H   sodium chloride  flush  3 mL Intravenous Q12H    Continuous Infusions:  sodium chloride  10 mL/hr at 06/05/24 0444   cefTRIAXone  (ROCEPHIN )  IV 2 g (06/05/24 0852)    PRN Meds: acetaminophen  **OR** acetaminophen , diphenhydrAMINE , famotidine , HYDROmorphone  (DILAUDID ) injection, ipratropium-albuterol , methylPREDNISolone  (SOLU-MEDROL ) injection, midazolam , mouth rinse, mouth rinse, oxyCODONE   Physical Exam Pulmonary:     Effort: Pulmonary effort is normal.  Neurological:     Mental Status: He is alert.             Vital Signs: BP 121/75   Pulse 81   Temp 98.5 F (36.9 C) (Oral)   Resp 19   Ht 5' 4 (1.626 m)   Wt 53.7 kg   SpO2 95%   BMI 20.31 kg/m  SpO2: SpO2: 95 % O2 Device: O2 Device: Nasal Cannula O2 Flow Rate: O2 Flow Rate (L/min): 2 L/min  Intake/output summary:   Intake/Output Summary (Last 24 hours) at 06/05/2024 1029 Last data filed at 06/05/2024 0444 Gross per 24 hour  Intake 1606.7 ml  Output 1000 ml  Net 606.7 ml   LBM: Last BM Date : 06/03/24 Baseline Weight: Weight: 57 kg Most recent weight: Weight: 53.7 kg        Patient Active Problem List   Diagnosis Date Noted   Protein-calorie malnutrition, severe 06/04/2024   ESRD on dialysis (HCC) 06/04/2024   New onset a-fib (HCC) 06/03/2024   Hypotension 06/03/2024   Nausea and vomiting 06/03/2024   Bacteremia due to Escherichia coli 06/03/2024   Goals of care, counseling/discussion 06/03/2024   Hematemesis without nausea 06/03/2024   Nephrostomy complication 06/03/2024   General weakness 06/03/2024   Complicated UTI (urinary tract infection) 06/03/2024   HFrEF (heart failure with reduced ejection fraction) (HCC)    Anemia of chronic renal failure    Septic shock (HCC) 05/28/2024   Hemodialysis graft malfunction 03/05/2024   Hypothyroidism 03/04/2024   Palliative care by specialist 03/04/2024   Dialysis AV fistula malfunction, initial encounter 03/03/2024   Hyperlipidemia 07/05/2022   ESRD on hemodialysis [facility])    Cocaine use disorder (HCC)    History of ischemic stroke    Chronic combined systolic and diastolic CHF (congestive heart failure) (HCC)    SOB (shortness of breath)    Prolonged QT interval 08/30/2021   Chronic combined systolic and diastolic heart failure (HCC) 06/30/2021   Fall 06/30/2021   End stage renal disease (HCC)    Malfunction of nephrostomy tube    Shortness of breath    Malnutrition of moderate degree 04/11/2021   Acute on chronic combined systolic and diastolic CHF (congestive heart failure) (HCC) 04/09/2021   Problem with dialysis access 04/09/2021   Protein calorie malnutrition 03/01/2021   Loculated pleural effusion 02/28/2021   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   History  of traumatic brain injury 09/07/2020   Hypoglycemia 09/04/2020    Uremia 09/04/2020   Diarrhea 07/24/2020   History of COVID-19 07/24/2020   Hyperphosphatemia 07/24/2020   Respiratory alkalosis 07/24/2020   COVID-19 virus infection 06/18/2020   Thrombocytopenia 06/18/2020   Elevated troponin 06/18/2020   Hyperkalemia 06/13/2020   Anemia in ESRD (end-stage renal disease) (HCC) 09/17/2019   Hypertension 07/15/2019   Testicular torsion    Pyuria 06/10/2016   Chronic pain following surgery or procedure 12/01/2015   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HCC) 12/01/2015   Tobacco use disorder 12/01/2015   Obstructed nephrostomy tube 10/23/2013   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  Continue full scope otherwise.  She does not want an EGD forced if he does not want the procedure.  She understands the need for ongoing conversations with patient and care teams in the future depending on his wishes. We will step away from daily visits and shadow for decline.  Please reach out for immediate needs.  Code Status:    Code Status Orders  (From admission, onward)           Start     Ordered   05/28/24 1311  Full code  Continuous       Question:  By:  Answer:  Consent: discussion documented in EHR   05/28/24 1311           Code Status History     Date Active Date Inactive Code Status Order ID Comments User Context   03/03/2024 1525 03/08/2024 2308 Full Code [id number]  Fernand Prost, MD ED   05/10/2023 1159 05/11/2023 0515 Full Code [id number]  Alona Corners, DO HOV   02/08/2023 1050 02/09/2023 0513 Full Code [id number]  Alona Corners, DO HOV   09/23/2021 1056 10/01/2021 2144 Full Code [id number]  Hilma Rankins, MD Inpatient   08/30/2021 0509 09/03/2021 1926 Full Code [id number]  Shona Terry SAILOR, DO ED    06/30/2021 0854 07/02/2021 2334 Full Code [id number]  Hilma Rankins, MD Inpatient   04/09/2021 1830 04/21/2021 2050 Full Code [id number]  Cox, Amy N, DO ED   02/28/2021 2048 03/03/2021 1939 Full Code [id number]  Ricky Alfrieda DASEN, DO ED   09/04/2020 2355 09/12/2020 0339 Full Code [id number]  Tobie Jorie SAUNDERS, MD ED   06/18/2020 1417 06/20/2020 1847 Full Code [id number]  Hilma Rankins, MD ED   06/13/2020 1338 06/15/2020 1522 Full Code [id number]  Lanetta Lingo, MD ED       Thank you for allowing the Palliative Medicine Team to assist in the care of this patient.   Time In: 10:05 12:10 Time Out: 10:30 12:40 Total Time 25 min 30 min-MOST form Prolonged Time Billed  no       Greater than 50%  of this time was spent counseling and coordinating care related to the above assessment and plan.  Spoke with Dr. Marvine to update re; Vynka MOST with [name] DSS.   Camelia Lewis, NP  Please contact Palliative Medicine Team phone at [phone] for questions and concerns.       "

## 2024-06-06 ENCOUNTER — Other Ambulatory Visit: Payer: Self-pay

## 2024-06-06 DIAGNOSIS — R6521 Severe sepsis with septic shock: Secondary | ICD-10-CM | POA: Diagnosis not present

## 2024-06-06 DIAGNOSIS — A419 Sepsis, unspecified organism: Secondary | ICD-10-CM | POA: Diagnosis not present

## 2024-06-06 LAB — BASIC METABOLIC PANEL WITH GFR
Anion gap: 18 — ABNORMAL HIGH (ref 5–15)
BUN: 20 mg/dL (ref 6–20)
CO2: 24 mmol/L (ref 22–32)
Calcium: 8.7 mg/dL — ABNORMAL LOW (ref 8.9–10.3)
Chloride: 99 mmol/L (ref 98–111)
Creatinine, Ser: 7.94 mg/dL — ABNORMAL HIGH (ref 0.61–1.24)
GFR, Estimated: 7 mL/min — ABNORMAL LOW
Glucose, Bld: 80 mg/dL (ref 70–99)
Potassium: 3.6 mmol/L (ref 3.5–5.1)
Sodium: 141 mmol/L (ref 135–145)

## 2024-06-06 LAB — CBC
HCT: 37.5 % — ABNORMAL LOW (ref 39.0–52.0)
Hemoglobin: 11.8 g/dL — ABNORMAL LOW (ref 13.0–17.0)
MCH: 31.8 pg (ref 26.0–34.0)
MCHC: 31.5 g/dL (ref 30.0–36.0)
MCV: 101.1 fL — ABNORMAL HIGH (ref 80.0–100.0)
Platelets: 215 K/uL (ref 150–400)
RBC: 3.71 MIL/uL — ABNORMAL LOW (ref 4.22–5.81)
RDW: 18.2 % — ABNORMAL HIGH (ref 11.5–15.5)
WBC: 11.9 K/uL — ABNORMAL HIGH (ref 4.0–10.5)
nRBC: 0.5 % — ABNORMAL HIGH (ref 0.0–0.2)

## 2024-06-06 LAB — POTASSIUM: Potassium: 3.4 mmol/L (ref 3.5–5.1)

## 2024-06-06 LAB — PHOSPHORUS: Phosphorus: 4.8 mg/dL — ABNORMAL HIGH (ref 2.5–4.6)

## 2024-06-06 LAB — MAGNESIUM: Magnesium: 2.2 mg/dL (ref 1.7–2.4)

## 2024-06-06 MED ORDER — SULFAMETHOXAZOLE-TRIMETHOPRIM 400-80 MG PO TABS
1.0000 | ORAL_TABLET | Freq: Two times a day (BID) | ORAL | 0 refills | Status: AC
  Filled 2024-06-06: qty 6, 3d supply, fill #0

## 2024-06-06 MED ORDER — METOPROLOL SUCCINATE ER 25 MG PO TB24
25.0000 mg | ORAL_TABLET | Freq: Two times a day (BID) | ORAL | 0 refills | Status: AC
  Filled 2024-06-06: qty 60, 30d supply, fill #0

## 2024-06-06 MED ORDER — VITAMIN B-1 100 MG PO TABS
100.0000 mg | ORAL_TABLET | Freq: Every day | ORAL | 0 refills | Status: AC
  Filled 2024-06-06: qty 7, 7d supply, fill #0

## 2024-06-06 MED ORDER — EPOETIN ALFA-EPBX 10000 UNIT/ML IJ SOLN
INTRAMUSCULAR | Status: AC
  Filled 2024-06-06: qty 1

## 2024-06-06 MED ORDER — VITAMIN B-1 100 MG PO TABS: 100.0000 mg | ORAL_TABLET | Freq: Every day | ORAL | Status: DC

## 2024-06-06 MED ORDER — METOPROLOL SUCCINATE ER 25 MG PO TB24: 25.0000 mg | ORAL_TABLET | Freq: Two times a day (BID) | ORAL | Status: DC

## 2024-06-06 MED ORDER — HEPARIN SODIUM (PORCINE) 1000 UNIT/ML IJ SOLN
INTRAMUSCULAR | Status: AC
  Filled 2024-06-06: qty 5

## 2024-06-06 MED ORDER — SULFAMETHOXAZOLE-TRIMETHOPRIM 400-80 MG PO TABS: 1.0000 | ORAL_TABLET | Freq: Two times a day (BID) | ORAL | Status: DC

## 2024-06-06 NOTE — Progress Notes (Signed)
 Pt contact, [name], notified of pt's readiness for discharge. Lawanda to set up [employer] for taxi transport. After 1 hour, this RN called back for status update and spoke with Lawanda's daughter, Leotis Brain, RN, who stated taxi service was not able to be set up and another service was being attempted [facility]) and they the front desk would be notified when they arrived.

## 2024-06-06 NOTE — Progress Notes (Signed)
 " [facility]  ROUNDING NOTE   Subjective:   Larry Morrison is a 54 year old male with past medical conditions including tobacco abuse, anemia, hypertension, TBI, malnutrition, combined heart failure, cocaine abuse, bilateral nephrostomy tubes and end-stage renal disease on hemodialysis.  Patient presents to the emergency department for shortness of breath and has been admitted for Shortness of breath [R06.02] General weakness [R53.1] ESRD on dialysis (HCC) [N18.6, Z99.2] Nephrostomy complication [N99.528] Sepsis (HCC) [A41.9] Elevated procalcitonin [R79.89] . Patient is known to our practice and receives outpatient dialysis treatments at [facility] on a MWF schedule, supervised by Dr. Douglas.    Update:   Patient seen and evaluated during dialysis   HEMODIALYSIS FLOWSHEET:  Blood Flow Rate (mL/min): 199 mL/min Arterial Pressure (mmHg): -92.32 mmHg Venous Pressure (mmHg): 115.55 mmHg TMP (mmHg): -7.07 mmHg Ultrafiltration Rate (mL/min): 481 mL/min Dialysate Flow Rate (mL/min): 300 ml/min Dialysis Fluid Bolus: Normal Saline Bolus Amount (mL): 100 mL  Tolerating treatment well Requesting discharge.   Objective:  Vital signs in last 24 hours:  Temp:  [98.3 F (36.8 C)-98.5 F (36.9 C)] 98.3 F (36.8 C) (01/16 0800) Pulse Rate:  [63-97] 89 (01/16 1204) Resp:  [13-35] 24 (01/16 1204) BP: (110-170)/(78-116) 122/92 (01/16 1204) SpO2:  [98 %-100 %] 100 % (01/16 1130) Weight:  [50.1 kg-55.3 kg] 50.1 kg (01/16 0800)  Weight change: 5.1 kg Filed Weights   06/05/24 0515 06/06/24 0500 06/06/24 0800  Weight: 53.7 kg 55.3 kg 50.1 kg    Intake/Output: I/O last 3 completed shifts: In: 1127.1 [P.O.:240; I.V.:887.1] Out: -    Intake/Output this shift:  Total I/O In: -  Out: 1000 [Other:1000]  Physical Exam: General: Laying in bed  Head: Normocephalic, atraumatic. Moist oral mucosal membranes  Eyes: Anicteric  Lungs:  Diminished, cough  Heart: Regular  rate and rhythm  Abdomen:  Soft, nontender, +urostomy tubes bilaterally  Extremities: 2+ peripheral edema.  Neurologic: Alert  Skin: Warm,dry, no rash  Access: Left chest IJ PermCath, left AVG nonfunctioning      Basic Metabolic Panel: Recent Labs  Lab 05/31/24 0350 06/01/24 0323 06/02/24 0537 06/03/24 0551 06/04/24 0830 06/06/24 0500  NA 134* 135 140 141 137  --   K 3.6 4.1 3.6 3.5 3.2* 3.4  CL 93* 93* 92* 94* 96*  --   CO2 23 21* 23 20* 26  --   GLUCOSE 88 60* 76 80 67*  --   BUN 20 32* 46* 59* 21*  --   CREATININE 5.46* 8.45* 11.50* 13.90* 7.04*  --   CALCIUM  8.7* 9.0 9.2 9.3 8.5*  --   MG  --  2.2 2.3 2.4  --   --   PHOS 3.7 5.3*  --  6.5* 4.2 4.8*    Liver Function Tests: Recent Labs  Lab 05/31/24 0350 06/01/24 0323 06/03/24 0551 06/04/24 0830  AST  --   --  24  --   ALT  --   --  24  --   ALKPHOS  --   --  67  --   BILITOT  --   --  0.4  --   PROT  --   --  7.9  --   ALBUMIN  3.0* 2.9* 3.0* 2.7*   No results for input(s): LIPASE, AMYLASE in the last 168 hours. No results for input(s): AMMONIA in the last 168 hours.  CBC: Recent Labs  Lab 05/31/24 0350 06/01/24 0323 06/02/24 0537 06/03/24 0551 06/03/24 1117 06/03/24 2344 06/04/24 0830 06/05/24 0008 06/05/24  0500  WBC 12.7* 14.1* 15.1* 10.7*  --   --  8.6  --  11.9*  NEUTROABS 9.9*  --   --   --   --   --   --   --   --   HGB 8.1* 7.5* 8.1* 7.9* 8.5* 7.6* 8.5* 11.3* 11.8*  HCT 26.4* 24.1* 27.1* 25.6* 27.2* 24.5* 27.4* 34.6* 37.5*  MCV 103.1* 104.8* 107.1* 106.7*  --   --  98.9  --  101.1*  PLT 200 195 220 261  --   --  207  --  215    Cardiac Enzymes: No results for input(s): CKTOTAL, CKMB, CKMBINDEX, TROPONINI in the last 168 hours.  BNP: Invalid input(s): POCBNP  CBG: Recent Labs  Lab 05/31/24 0735 06/01/24 0552 06/01/24 0623 06/01/24 0751 06/03/24 1021  GLUCAP 96 63* 127* 83 87    Microbiology: Results for orders placed or performed during the hospital encounter  of 05/28/24  Resp panel by RT-PCR (RSV, Flu A&B, Covid) Anterior Nasal Swab     Status: None   Collection Time: 05/28/24 10:07 AM   Specimen: Anterior Nasal Swab  Result Value Ref Range Status   SARS Coronavirus 2 by RT PCR NEGATIVE NEGATIVE Final    Comment: (NOTE) SARS-CoV-2 target nucleic acids are NOT DETECTED.  The SARS-CoV-2 RNA is generally detectable in upper respiratory specimens during the acute phase of infection. The lowest concentration of SARS-CoV-2 viral copies this assay can detect is 138 copies/mL. A negative result does not preclude SARS-Cov-2 infection and should not be used as the sole basis for treatment or other patient management decisions. A negative result may occur with  improper specimen collection/handling, submission of specimen other than nasopharyngeal swab, presence of viral mutation(s) within the areas targeted by this assay, and inadequate number of viral copies(<138 copies/mL). A negative result must be combined with clinical observations, patient history, and epidemiological information. The expected result is Negative.  Fact Sheet for Patients:  bloggercourse.com  Fact Sheet for Healthcare Providers:  seriousbroker.it  This test is no t yet approved or cleared by the [place]  FDA and  has been authorized for detection and/or diagnosis of SARS-CoV-2 by FDA under an Emergency Use Authorization (EUA). This EUA will remain  in effect (meaning this test can be used) for the duration of the COVID-19 declaration under Section 564(b)(1) of the Act, 21 U.S.C.section 360bbb-3(b)(1), unless the authorization is terminated  or revoked sooner.       Influenza A by PCR NEGATIVE NEGATIVE Final   Influenza B by PCR NEGATIVE NEGATIVE Final    Comment: (NOTE) The Xpert Xpress SARS-CoV-2/FLU/RSV plus assay is intended as an aid in the diagnosis of influenza from Nasopharyngeal swab specimens and should  not be used as a sole basis for treatment. Nasal washings and aspirates are unacceptable for Xpert Xpress SARS-CoV-2/FLU/RSV testing.  Fact Sheet for Patients: bloggercourse.com  Fact Sheet for Healthcare Providers: seriousbroker.it  This test is not yet approved or cleared by the [place]  FDA and has been authorized for detection and/or diagnosis of SARS-CoV-2 by FDA under an Emergency Use Authorization (EUA). This EUA will remain in effect (meaning this test can be used) for the duration of the COVID-19 declaration under Section 564(b)(1) of the Act, 21 U.S.C. section 360bbb-3(b)(1), unless the authorization is terminated or revoked.     Resp Syncytial Virus by PCR NEGATIVE NEGATIVE Final    Comment: (NOTE) Fact Sheet for Patients: bloggercourse.com  Fact Sheet for Healthcare Providers: seriousbroker.it  This test is not yet approved or cleared by the [place]  FDA and has been authorized for detection and/or diagnosis of SARS-CoV-2 by FDA under an Emergency Use Authorization (EUA). This EUA will remain in effect (meaning this test can be used) for the duration of the COVID-19 declaration under Section 564(b)(1) of the Act, 21 U.S.C. section 360bbb-3(b)(1), unless the authorization is terminated or revoked.  Performed at [facility], [address] [place], [place] [address]   Respiratory (~20 pathogens) panel by PCR     Status: None   Collection Time: 05/28/24 10:07 AM   Specimen: Nasopharyngeal Swab; Respiratory  Result Value Ref Range Status   Adenovirus NOT DETECTED NOT DETECTED Final   Coronavirus 229E NOT DETECTED NOT DETECTED Final    Comment: (NOTE) The Coronavirus on the Respiratory Panel, DOES NOT test for the novel  Coronavirus (2019 nCoV)    Coronavirus HKU1 NOT DETECTED NOT DETECTED Final   Coronavirus NL63 NOT DETECTED NOT DETECTED  Final   Coronavirus OC43 NOT DETECTED NOT DETECTED Final   Metapneumovirus NOT DETECTED NOT DETECTED Final   Rhinovirus / Enterovirus NOT DETECTED NOT DETECTED Final   Influenza A NOT DETECTED NOT DETECTED Final   Influenza B NOT DETECTED NOT DETECTED Final   Parainfluenza Virus 1 NOT DETECTED NOT DETECTED Final   Parainfluenza Virus 2 NOT DETECTED NOT DETECTED Final   Parainfluenza Virus 3 NOT DETECTED NOT DETECTED Final   Parainfluenza Virus 4 NOT DETECTED NOT DETECTED Final   Respiratory Syncytial Virus NOT DETECTED NOT DETECTED Final   Bordetella pertussis NOT DETECTED NOT DETECTED Final   Bordetella Parapertussis NOT DETECTED NOT DETECTED Final   Chlamydophila pneumoniae NOT DETECTED NOT DETECTED Final   Mycoplasma pneumoniae NOT DETECTED NOT DETECTED Final    Comment: Performed at [facility], [address] [place], [place] [address]  Blood culture (routine x 2)     Status: None   Collection Time: 05/28/24 12:52 PM   Specimen: BLOOD  Result Value Ref Range Status   Specimen Description BLOOD RIGHT ANTECUBITAL  Final   Special Requests   Final    BOTTLES DRAWN AEROBIC AND ANAEROBIC Blood Culture results may not be optimal due to an inadequate volume of blood received in culture bottles   Culture   Final    NO GROWTH 5 DAYS Performed at [facility], [address], [place], [place] [address]    Report Status 06/02/2024 FINAL  Final  Urine Culture     Status: Abnormal   Collection Time: 05/28/24 12:52 PM   Specimen: Urine, Catheterized  Result Value Ref Range Status   Specimen Description   Final    URINE, CATHETERIZED Performed at [facility], [address], [place], [place] [address]    Special Requests   Final    NONE Performed at [facility], [address] [place], [place] [address]    Culture (A)  Final    >=100,000 COLONIES/mL ESCHERICHIA COLI >=100,000 COLONIES/mL CITROBACTER KOSERI    Report Status 05/31/2024 FINAL   Final   Organism ID, Bacteria ESCHERICHIA COLI (A)  Final   Organism ID, Bacteria CITROBACTER KOSERI (A)  Final      Susceptibility   Citrobacter koseri - MIC*    CEFEPIME  <=0.12 SENSITIVE Sensitive     ERTAPENEM <=0.12 SENSITIVE Sensitive     CEFTRIAXONE  <=0.25 SENSITIVE Sensitive     CIPROFLOXACIN  <=0.06 SENSITIVE Sensitive     GENTAMICIN <=1 SENSITIVE Sensitive  NITROFURANTOIN  32 SENSITIVE Sensitive     TRIMETH /SULFA  <=20 SENSITIVE Sensitive     PIP/TAZO Value in next row Sensitive      <=4 SENSITIVEThis is a modified FDA-approved test that has been validated and its performance characteristics determined by the reporting laboratory.  This laboratory is certified under the Clinical Laboratory Improvement Amendments CLIA as qualified to perform high complexity clinical laboratory testing.    MEROPENEM Value in next row Sensitive      <=4 SENSITIVEThis is a modified FDA-approved test that has been validated and its performance characteristics determined by the reporting laboratory.  This laboratory is certified under the Clinical Laboratory Improvement Amendments CLIA as qualified to perform high complexity clinical laboratory testing.    * >=100,000 COLONIES/mL CITROBACTER KOSERI   Escherichia coli - MIC*    AMPICILLIN Value in next row Sensitive      <=4 SENSITIVEThis is a modified FDA-approved test that has been validated and its performance characteristics determined by the reporting laboratory.  This laboratory is certified under the Clinical Laboratory Improvement Amendments CLIA as qualified to perform high complexity clinical laboratory testing.    CEFAZOLIN  (URINE) Value in next row Sensitive      2 SENSITIVEThis is a modified FDA-approved test that has been validated and its performance characteristics determined by the reporting laboratory.  This laboratory is certified under the Clinical Laboratory Improvement Amendments CLIA as qualified to perform high complexity clinical  laboratory testing.    CEFEPIME  Value in next row Sensitive      2 SENSITIVEThis is a modified FDA-approved test that has been validated and its performance characteristics determined by the reporting laboratory.  This laboratory is certified under the Clinical Laboratory Improvement Amendments CLIA as qualified to perform high complexity clinical laboratory testing.    ERTAPENEM Value in next row Sensitive      2 SENSITIVEThis is a modified FDA-approved test that has been validated and its performance characteristics determined by the reporting laboratory.  This laboratory is certified under the Clinical Laboratory Improvement Amendments CLIA as qualified to perform high complexity clinical laboratory testing.    CEFTRIAXONE  Value in next row Sensitive      2 SENSITIVEThis is a modified FDA-approved test that has been validated and its performance characteristics determined by the reporting laboratory.  This laboratory is certified under the Clinical Laboratory Improvement Amendments CLIA as qualified to perform high complexity clinical laboratory testing.    CIPROFLOXACIN  Value in next row Sensitive      2 SENSITIVEThis is a modified FDA-approved test that has been validated and its performance characteristics determined by the reporting laboratory.  This laboratory is certified under the Clinical Laboratory Improvement Amendments CLIA as qualified to perform high complexity clinical laboratory testing.    GENTAMICIN Value in next row Sensitive      2 SENSITIVEThis is a modified FDA-approved test that has been validated and its performance characteristics determined by the reporting laboratory.  This laboratory is certified under the Clinical Laboratory Improvement Amendments CLIA as qualified to perform high complexity clinical laboratory testing.    NITROFURANTOIN  Value in next row Sensitive      2 SENSITIVEThis is a modified FDA-approved test that has been validated and its performance  characteristics determined by the reporting laboratory.  This laboratory is certified under the Clinical Laboratory Improvement Amendments CLIA as qualified to perform high complexity clinical laboratory testing.    TRIMETH /SULFA  Value in next row Sensitive      2 SENSITIVEThis is a modified FDA-approved  test that has been validated and its performance characteristics determined by the reporting laboratory.  This laboratory is certified under the Clinical Laboratory Improvement Amendments CLIA as qualified to perform high complexity clinical laboratory testing.    AMPICILLIN/SULBACTAM Value in next row Sensitive      2 SENSITIVEThis is a modified FDA-approved test that has been validated and its performance characteristics determined by the reporting laboratory.  This laboratory is certified under the Clinical Laboratory Improvement Amendments CLIA as qualified to perform high complexity clinical laboratory testing.    PIP/TAZO Value in next row Sensitive      <=4 SENSITIVEThis is a modified FDA-approved test that has been validated and its performance characteristics determined by the reporting laboratory.  This laboratory is certified under the Clinical Laboratory Improvement Amendments CLIA as qualified to perform high complexity clinical laboratory testing.    MEROPENEM Value in next row Sensitive      <=4 SENSITIVEThis is a modified FDA-approved test that has been validated and its performance characteristics determined by the reporting laboratory.  This laboratory is certified under the Clinical Laboratory Improvement Amendments CLIA as qualified to perform high complexity clinical laboratory testing.    * >=100,000 COLONIES/mL ESCHERICHIA COLI  Expectorated Sputum Assessment w Gram Stain, Rflx to Resp Cult     Status: None   Collection Time: 05/28/24  1:18 PM   Specimen: Salivary Gland; Sputum  Result Value Ref Range Status   Specimen Description SALIVA  Final   Special Requests EXPSU  Final    Sputum evaluation   Final    THIS SPECIMEN IS ACCEPTABLE FOR SPUTUM CULTURE Performed at [facility], [address] [place], [place] [address]    Report Status 05/29/2024 FINAL  Final  Culture, Respiratory w Gram Stain     Status: None   Collection Time: 05/28/24  1:18 PM  Result Value Ref Range Status   Specimen Description EXPECTORATED SPUTUM  Final   Special Requests Reflexed from [id number]  Final   Gram Stain   Final    FEW WBC PRESENT, PREDOMINANTLY PMN RARE GRAM NEGATIVE RODS Performed at [facility], [address], [place], [place] [address]    Culture RARE CANDIDA ALBICANS  Final   Report Status 06/01/2024 FINAL  Final  MRSA Next Gen by PCR, Nasal     Status: None   Collection Time: 05/28/24  9:40 PM   Specimen: Nasal Mucosa; Nasal Swab  Result Value Ref Range Status   MRSA by PCR Next Gen NOT DETECTED NOT DETECTED Final    Comment: (NOTE) The GeneXpert MRSA Assay (FDA approved for NASAL specimens only), is one component of a comprehensive MRSA colonization surveillance program. It is not intended to diagnose MRSA infection nor to guide or monitor treatment for MRSA infections. Test performance is not FDA approved in patients less than 87 years old. Performed at [facility], [address] [place], [place] [address]   Blood culture (routine x 2)     Status: None   Collection Time: 05/28/24 10:24 PM   Specimen: BLOOD  Result Value Ref Range Status   Specimen Description BLOOD BLOOD RIGHT HAND  Final   Special Requests   Final    BOTTLES DRAWN AEROBIC ONLY Blood Culture results may not be optimal due to an inadequate volume of blood received in culture bottles   Culture   Final    NO GROWTH 5 DAYS Performed at [facility], [address], [place], [place] [address]  Report Status 06/02/2024 FINAL  Final  Body fluid culture w Gram Stain     Status: None   Collection Time: 05/29/24  8:00 AM   Specimen: Other Source; Body  Fluid  Result Value Ref Range Status   Specimen Description   Final    OTHER Performed at [facility], [address] [place], [place] [address]    Special Requests   Final    NONE Performed at [facility], [address] [place], [place] [address]    Gram Stain   Final    FEW WBC PRESENT, PREDOMINANTLY PMN MODERATE GRAM POSITIVE COCCI FEW GRAM NEGATIVE RODS Performed at [facility], [address], [place], [place] [address]    Culture   Final    ABUNDANT ENTEROCOCCUS FAECALIS ABUNDANT ESCHERICHIA COLI ABUNDANT CITROBACTER KOSERI FEW PROTEUS MIRABILIS    Report Status 06/03/2024 FINAL  Final   Organism ID, Bacteria ESCHERICHIA COLI  Final   Organism ID, Bacteria CITROBACTER KOSERI  Final   Organism ID, Bacteria PROTEUS MIRABILIS  Final   Organism ID, Bacteria ENTEROCOCCUS FAECALIS  Final      Susceptibility   Citrobacter koseri - MIC*    CEFEPIME  <=0.12 SENSITIVE Sensitive     ERTAPENEM <=0.12 SENSITIVE Sensitive     CEFTRIAXONE  <=0.25 SENSITIVE Sensitive     CIPROFLOXACIN  <=0.06 SENSITIVE Sensitive     GENTAMICIN <=1 SENSITIVE Sensitive     MEROPENEM <=0.25 SENSITIVE Sensitive     TRIMETH /SULFA  <=20 SENSITIVE Sensitive     PIP/TAZO Value in next row Sensitive      <=4 SENSITIVEThis is a modified FDA-approved test that has been validated and its performance characteristics determined by the reporting laboratory.  This laboratory is certified under the Clinical Laboratory Improvement Amendments CLIA as qualified to perform high complexity clinical laboratory testing.    * ABUNDANT CITROBACTER KOSERI   Escherichia coli - MIC*    AMPICILLIN Value in next row Sensitive      <=4 SENSITIVEThis is a modified FDA-approved test that has been validated and its performance characteristics determined by the reporting laboratory.  This laboratory is certified under the Clinical Laboratory Improvement Amendments CLIA as qualified to perform high complexity  clinical laboratory testing.    CEFAZOLIN  (NON-URINE) Value in next row Sensitive      <=4 SENSITIVEThis is a modified FDA-approved test that has been validated and its performance characteristics determined by the reporting laboratory.  This laboratory is certified under the Clinical Laboratory Improvement Amendments CLIA as qualified to perform high complexity clinical laboratory testing.    CEFEPIME  Value in next row Sensitive      <=4 SENSITIVEThis is a modified FDA-approved test that has been validated and its performance characteristics determined by the reporting laboratory.  This laboratory is certified under the Clinical Laboratory Improvement Amendments CLIA as qualified to perform high complexity clinical laboratory testing.    ERTAPENEM Value in next row Sensitive      <=4 SENSITIVEThis is a modified FDA-approved test that has been validated and its performance characteristics determined by the reporting laboratory.  This laboratory is certified under the Clinical Laboratory Improvement Amendments CLIA as qualified to perform high complexity clinical laboratory testing.    CEFTRIAXONE  Value in next row Sensitive      <=4 SENSITIVEThis is a modified FDA-approved test that has been validated and its performance characteristics determined by the reporting laboratory.  This laboratory is certified under the Clinical Laboratory Improvement Amendments CLIA as qualified to perform  high complexity clinical laboratory testing.    CIPROFLOXACIN  Value in next row Sensitive      <=4 SENSITIVEThis is a modified FDA-approved test that has been validated and its performance characteristics determined by the reporting laboratory.  This laboratory is certified under the Clinical Laboratory Improvement Amendments CLIA as qualified to perform high complexity clinical laboratory testing.    GENTAMICIN Value in next row Sensitive      <=4 SENSITIVEThis is a modified FDA-approved test that has been validated and  its performance characteristics determined by the reporting laboratory.  This laboratory is certified under the Clinical Laboratory Improvement Amendments CLIA as qualified to perform high complexity clinical laboratory testing.    MEROPENEM Value in next row Sensitive      <=4 SENSITIVEThis is a modified FDA-approved test that has been validated and its performance characteristics determined by the reporting laboratory.  This laboratory is certified under the Clinical Laboratory Improvement Amendments CLIA as qualified to perform high complexity clinical laboratory testing.    TRIMETH /SULFA  Value in next row Sensitive      <=4 SENSITIVEThis is a modified FDA-approved test that has been validated and its performance characteristics determined by the reporting laboratory.  This laboratory is certified under the Clinical Laboratory Improvement Amendments CLIA as qualified to perform high complexity clinical laboratory testing.    AMPICILLIN/SULBACTAM Value in next row Sensitive      <=4 SENSITIVEThis is a modified FDA-approved test that has been validated and its performance characteristics determined by the reporting laboratory.  This laboratory is certified under the Clinical Laboratory Improvement Amendments CLIA as qualified to perform high complexity clinical laboratory testing.    PIP/TAZO Value in next row Sensitive      <=4 SENSITIVEThis is a modified FDA-approved test that has been validated and its performance characteristics determined by the reporting laboratory.  This laboratory is certified under the Clinical Laboratory Improvement Amendments CLIA as qualified to perform high complexity clinical laboratory testing.    * ABUNDANT ESCHERICHIA COLI   Enterococcus faecalis - MIC*    AMPICILLIN Value in next row Sensitive      <=4 SENSITIVEThis is a modified FDA-approved test that has been validated and its performance characteristics determined by the reporting laboratory.  This laboratory is  certified under the Clinical Laboratory Improvement Amendments CLIA as qualified to perform high complexity clinical laboratory testing.    VANCOMYCIN  Value in next row Sensitive      <=4 SENSITIVEThis is a modified FDA-approved test that has been validated and its performance characteristics determined by the reporting laboratory.  This laboratory is certified under the Clinical Laboratory Improvement Amendments CLIA as qualified to perform high complexity clinical laboratory testing.    GENTAMICIN SYNERGY Value in next row Sensitive      <=4 SENSITIVEThis is a modified FDA-approved test that has been validated and its performance characteristics determined by the reporting laboratory.  This laboratory is certified under the Clinical Laboratory Improvement Amendments CLIA as qualified to perform high complexity clinical laboratory testing.    * ABUNDANT ENTEROCOCCUS FAECALIS   Proteus mirabilis - MIC*    AMPICILLIN Value in next row Sensitive      <=4 SENSITIVEThis is a modified FDA-approved test that has been validated and its performance characteristics determined by the reporting laboratory.  This laboratory is certified under the Clinical Laboratory Improvement Amendments CLIA as qualified to perform high complexity clinical laboratory testing.    CEFAZOLIN  (NON-URINE) Value in next row Intermediate      <=4  SENSITIVEThis is a modified FDA-approved test that has been validated and its performance characteristics determined by the reporting laboratory.  This laboratory is certified under the Clinical Laboratory Improvement Amendments CLIA as qualified to perform high complexity clinical laboratory testing.    CEFEPIME  Value in next row Sensitive      <=4 SENSITIVEThis is a modified FDA-approved test that has been validated and its performance characteristics determined by the reporting laboratory.  This laboratory is certified under the Clinical Laboratory Improvement Amendments CLIA as qualified to  perform high complexity clinical laboratory testing.    ERTAPENEM Value in next row Sensitive      <=4 SENSITIVEThis is a modified FDA-approved test that has been validated and its performance characteristics determined by the reporting laboratory.  This laboratory is certified under the Clinical Laboratory Improvement Amendments CLIA as qualified to perform high complexity clinical laboratory testing.    CEFTRIAXONE  Value in next row Sensitive      <=4 SENSITIVEThis is a modified FDA-approved test that has been validated and its performance characteristics determined by the reporting laboratory.  This laboratory is certified under the Clinical Laboratory Improvement Amendments CLIA as qualified to perform high complexity clinical laboratory testing.    CIPROFLOXACIN  Value in next row Sensitive      <=4 SENSITIVEThis is a modified FDA-approved test that has been validated and its performance characteristics determined by the reporting laboratory.  This laboratory is certified under the Clinical Laboratory Improvement Amendments CLIA as qualified to perform high complexity clinical laboratory testing.    GENTAMICIN Value in next row Sensitive      <=4 SENSITIVEThis is a modified FDA-approved test that has been validated and its performance characteristics determined by the reporting laboratory.  This laboratory is certified under the Clinical Laboratory Improvement Amendments CLIA as qualified to perform high complexity clinical laboratory testing.    MEROPENEM Value in next row Sensitive      <=4 SENSITIVEThis is a modified FDA-approved test that has been validated and its performance characteristics determined by the reporting laboratory.  This laboratory is certified under the Clinical Laboratory Improvement Amendments CLIA as qualified to perform high complexity clinical laboratory testing.    TRIMETH /SULFA  Value in next row Sensitive      <=4 SENSITIVEThis is a modified FDA-approved test that has  been validated and its performance characteristics determined by the reporting laboratory.  This laboratory is certified under the Clinical Laboratory Improvement Amendments CLIA as qualified to perform high complexity clinical laboratory testing.    AMPICILLIN/SULBACTAM Value in next row Sensitive      <=4 SENSITIVEThis is a modified FDA-approved test that has been validated and its performance characteristics determined by the reporting laboratory.  This laboratory is certified under the Clinical Laboratory Improvement Amendments CLIA as qualified to perform high complexity clinical laboratory testing.    PIP/TAZO Value in next row Sensitive      <=4 SENSITIVEThis is a modified FDA-approved test that has been validated and its performance characteristics determined by the reporting laboratory.  This laboratory is certified under the Clinical Laboratory Improvement Amendments CLIA as qualified to perform high complexity clinical laboratory testing.    * FEW PROTEUS MIRABILIS    Coagulation Studies: No results for input(s): LABPROT, INR in the last 72 hours.  Urinalysis: No results for input(s): COLORURINE, LABSPEC, PHURINE, GLUCOSEU, HGBUR, BILIRUBINUR, KETONESUR, PROTEINUR, UROBILINOGEN, NITRITE, LEUKOCYTESUR in the last 72 hours.  Invalid input(s): APPERANCEUR     Imaging: No results found.     Medications:  sodium chloride  10 mL/hr at 06/05/24 0444   cefTRIAXone  (ROCEPHIN )  IV 2 g (06/05/24 9147)    sodium chloride    Intravenous Once   atorvastatin   40 mg Oral Daily   calcium  acetate  1,334 mg Oral TID WC   Chlorhexidine  Gluconate Cloth  6 each Topical Q0600   epoetin  alfa-epbx (RETACRIT ) injection  10,000 Units Intravenous Q M,W,F-1800   feeding supplement  1 Container Oral TID BM   heparin   5,000 Units Subcutaneous Q8H   lactulose   30 g Oral Once   levothyroxine   50 mcg Oral Daily   lidocaine -prilocaine   1 Application Topical Once per day on  Monday Wednesday Friday   metoprolol  succinate  25 mg Oral BID   midodrine   5 mg Oral TID WC   multivitamin  1 tablet Oral QHS   mouth rinse  15 mL Mouth Rinse 4 times per day   pantoprazole   40 mg Oral BID   sodium chloride  flush  3 mL Intravenous Q12H   thiamine   100 mg Oral Daily   acetaminophen  **OR** acetaminophen , diphenhydrAMINE , HYDROmorphone  (DILAUDID ) injection, ipratropium-albuterol , methylPREDNISolone  (SOLU-MEDROL ) injection, midazolam , mouth rinse, mouth rinse, oxyCODONE   Assessment/ Plan:  Larry Morrison is a 54 y.o.  male with past medical conditions including tobacco abuse, anemia, hypertension, TBI, malnutrition, combined heart failure, cocaine abuse, bilateral nephrostomy tubes and end-stage renal disease on hemodialysis.  Patient presents to the emergency department for shortness of breath and has been admitted for Shortness of breath [R06.02] General weakness [R53.1] ESRD on dialysis (HCC) [N18.6, Z99.2] Nephrostomy complication [N99.528] Sepsis (HCC) [A41.9] Elevated procalcitonin [R79.89]    End-stage renal disease on hemodialysis.  Receiving dialysis today, UF 1-1.5L as tolerated. Next treatment scheduled for Monday.   2.  Sepsis with E. Coli and Citrobacter species growing from urine.  With hypotension requiring vasopressors.  IR performed nephrostomy exchange this admission - ceftriaxone  completed   3. Anemia of chronic kidney disease Lab Results  Component Value Date   HGB 11.8 (L) 06/05/2024  Hemoglobin above desired range.  No need for ESA's at this time.  4. Secondary Hyperparathyroidism: with outpatient labs: PTH 438, phosphorus 5.9, calcium  8.3 on 04/28/24.   Lab Results  Component Value Date   CALCIUM  8.5 (L) 06/04/2024   CAION 0.79 (LL) 09/23/2021   PHOS 4.8 (H) 06/06/2024    Patient prescribed calcitriol  and calcium  acetate outpatient.  Calcium  and phos within optimal range.   5.  Hypotensive with history of hypertension with chronic  kidney disease.  Home regimen includes carvedilol  and irbesartan  being held - weaned off vasopressor: norepinephrine .  - Continue midodrine  and metoprolol    LOS: 9 Parlee Amescua 1/16/202612:26 PM   "

## 2024-06-06 NOTE — Plan of Care (Signed)
 PMT shadowing at this time.  Patient was just returned from dialysis.  Progress patient is discharging back to facility.  MOST form was redone with [place] [facility] due to a spelling error.  I completed a MOST form today with [place] [facility] [facility]:  Cardiopulmonary Resuscitation: Do Not Attempt Resuscitation (DNR/No CPR)  Medical Interventions: Full Scope of Treatment: Use intubation, advanced airway interventions, mechanical ventilation, cardioversion as indicated, medical treatment, IV fluids, etc, also provide comfort measures. Transfer to the hospital if indicated  Antibiotics: Antibiotics if indicated  IV Fluids: IV fluids if indicated  Feeding Tube: Left Blank

## 2024-06-06 NOTE — TOC Transition Note (Signed)
 Transition of Care [facility]) - Discharge Note   Patient Details  Name: Larry Morrison MRN: [id number] Date of Birth: 04-15-1971  [facility] CM/SW Contact:  Shasta DELENA Daring, RN Phone Number: 06/06/2024, 3:10 PM   Clinical Narrative:     Patient has discharge orders. Notified Zyanna with DSS. Contacted [employer] for a ride.  No additional TOC needs.  RNCM signing off  Final next level of care: Assisted Living Barriers to Discharge: Barriers Resolved   Patient Goals and CMS Choice            Discharge Placement                  Name of family member notified: [name] and Zyanna with DSS Patient and family notified of of transfer: 06/06/24  Discharge Plan and Services Additional resources added to the After Visit Summary for                                       Social Drivers of Health (SDOH) Interventions SDOH Screenings   Food Insecurity: No Food Insecurity (05/29/2024)  Housing: Low Risk (05/29/2024)  Transportation Needs: No Transportation Needs (05/29/2024)  Utilities: Not At Risk (05/29/2024)  Social Connections: Socially Isolated (03/05/2024)  Tobacco Use: High Risk (05/28/2024)     Readmission Risk Interventions    05/29/2024    1:43 PM  Readmission Risk Prevention Plan  Transportation Screening Complete  Medication Review (RN Care Manager) Complete  PCP or Specialist appointment within 3-5 days of discharge Complete  HRI or Home Care Consult Complete  SW Recovery Care/Counseling Consult Complete  Palliative Care Screening Not Applicable  Skilled Nursing Facility Not Applicable

## 2024-06-06 NOTE — [place] FL2 (Signed)
 "   MEDICAID FL2 LEVEL OF CARE FORM     IDENTIFICATION  Patient Name: Larry Morrison Birthdate: Aug 30, 1970 Sex: male Admission Date (Current Location): 05/28/2024  [place] and [place] Number:  Chiropodist and Address:  [facility], [address], [place], [place] [address]      Provider Number: [id number]  Attending Physician Name and Address:  Jens Durand, MD  Relative Name and Phone Number:       Current Level of Care:  (assisted living) Recommended Level of Care: Assisted Living Facility Prior Approval Number:    Date Approved/Denied:   PASRR Number:    Discharge Plan: Other (Comment) (assisted living)    Current Diagnoses: Patient Active Problem List   Diagnosis Date Noted   Medical orders for scope of treatment form in chart 06/05/2024   Protein-calorie malnutrition, severe 06/04/2024   ESRD on dialysis (HCC) 06/04/2024   New onset a-fib (HCC) 06/03/2024   Hypotension 06/03/2024   Nausea and vomiting 06/03/2024   Bacteremia due to Escherichia coli 06/03/2024   Goals of care, counseling/discussion 06/03/2024   Hematemesis without nausea 06/03/2024   Nephrostomy complication 06/03/2024   General weakness 06/03/2024   Complicated UTI (urinary tract infection) 06/03/2024   HFrEF (heart failure with reduced ejection fraction) (HCC)    Anemia of chronic renal failure    Septic shock (HCC) 05/28/2024   Hemodialysis graft malfunction 03/05/2024   Hypothyroidism 03/04/2024   Palliative care by specialist 03/04/2024   Dialysis AV fistula malfunction, initial encounter 03/03/2024   Hyperlipidemia 07/05/2022   ESRD on hemodialysis (HCC)    Cocaine use disorder (HCC)    History of ischemic stroke    Chronic combined systolic and diastolic CHF (congestive heart failure) (HCC)    SOB (shortness of breath)    Prolonged QT interval 08/30/2021   Chronic combined systolic and diastolic heart failure (HCC) 06/30/2021   Fall  06/30/2021   End stage renal disease (HCC)    Malfunction of nephrostomy tube    Shortness of breath    Malnutrition of moderate degree 04/11/2021   Acute on chronic combined systolic and diastolic CHF (congestive heart failure) (HCC) 04/09/2021   Problem with dialysis access 04/09/2021   Protein calorie malnutrition 03/01/2021   Loculated pleural effusion 02/28/2021   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   History of traumatic brain injury 09/07/2020   Hypoglycemia 09/04/2020   Uremia 09/04/2020   Diarrhea 07/24/2020   History of COVID-19 07/24/2020   Hyperphosphatemia 07/24/2020   Respiratory alkalosis 07/24/2020   COVID-19 virus infection 06/18/2020   Thrombocytopenia 06/18/2020   Elevated troponin 06/18/2020   Hyperkalemia 06/13/2020   Anemia in ESRD (end-stage renal disease) (HCC) 09/17/2019   Hypertension 07/15/2019   Testicular torsion    Pyuria 06/10/2016   Chronic pain following surgery or procedure 12/01/2015   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HCC) 12/01/2015   Tobacco use disorder 12/01/2015   Obstructed nephrostomy tube 10/23/2013   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   Neurogenic bladder 02/09/1998   Urinary calculus 02/09/1998   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    Orientation RESPIRATION BLADDER Height & Weight     Self, Time, Situation, Place  O2 (Plan to wean prior to discharge) Continent Weight: 50.1 kg Height:  5' 4 (162.6 cm)  BEHAVIORAL SYMPTOMS/MOOD NEUROLOGICAL BOWEL NUTRITION STATUS      Incontinent Diet (Dysphagia 3)  AMBULATORY STATUS COMMUNICATION OF NEEDS Skin    (wheelchair bound) Verbally Normal  Personal Care Assistance Level of Assistance              Functional Limitations Info             SPECIAL CARE FACTORS FREQUENCY                       Contractures Contractures Info: Present    Additional Factors Info  Code Status, Allergies Code  Status Info: DNR Allergies Info: NKDA           Current Medications (06/06/2024):  This is the current hospital active medication list Current Facility-Administered Medications  Medication Dose Route Frequency Provider Last Rate Last Admin   0.9 %  sodium chloride  infusion (Manually program via Guardrails IV Fluids)   Intravenous Once Mansy, Jan A, MD       0.9 %  sodium chloride  infusion   Intravenous Continuous Millee Denise, Fallon E, NP 10 mL/hr at 06/05/24 0444 Infusion Verify at 06/05/24 0444   acetaminophen  (TYLENOL ) tablet 650 mg  650 mg Oral Q6H PRN Alto Isaiah CROME, NP   650 mg at 06/05/24 2122   Or   acetaminophen  (TYLENOL ) suppository 650 mg  650 mg Rectal Q6H PRN Alto Isaiah CROME, NP       atorvastatin  (LIPITOR) tablet 40 mg  40 mg Oral Daily Alto Isaiah CROME, NP   40 mg at 06/05/24 0836   calcium  acetate (PHOSLO ) capsule 1,334 mg  1,334 mg Oral TID WC Alto Isaiah CROME, NP   1,334 mg at 06/05/24 1302   cefTRIAXone  (ROCEPHIN ) 2 g in sodium chloride  0.9 % 100 mL IVPB  2 g Intravenous Q24H Fayette Bodily, MD 200 mL/hr at 06/05/24 0852 2 g at 06/05/24 9147   Chlorhexidine  Gluconate Cloth 2 % PADS 6 each  6 each Topical Q0600 Druscilla Bald, NP   6 each at 06/06/24 0630   diphenhydrAMINE  (BENADRYL ) injection 50 mg  50 mg Intravenous Once PRN Emmery Seiler, Fallon E, NP       epoetin  alfa-epbx (RETACRIT ) injection 10,000 Units  10,000 Units Intravenous Q M,W,F-1800 Breeze, Bald, NP   10,000 Units at 06/06/24 1105   feeding supplement (BOOST / RESOURCE BREEZE) liquid 1 Container  1 Container Oral TID BM Jens Durand, MD       heparin  injection 5,000 Units  5,000 Units Subcutaneous Q8H Alto Isaiah CROME, NP   5,000 Units at 06/05/24 0003   HYDROmorphone  (DILAUDID ) injection 1 mg  1 mg Intravenous Q6H PRN Bousman, Karlie, PA-C   1 mg at 06/05/24 1833   ipratropium-albuterol  (DUONEB) 0.5-2.5 (3) MG/3ML nebulizer solution 3 mL  3 mL Nebulization Q4H PRN Franchot Novel, MD        lactulose  (CHRONULAC ) 10 GM/15ML solution 30 g  30 g Oral Once Tukov-Yual, Magdalene S, NP       levothyroxine  (SYNTHROID ) tablet 50 mcg  50 mcg Oral Daily Alto Isaiah CROME, NP   50 mcg at 06/02/24 0602   lidocaine -prilocaine  (EMLA ) cream 1 Application  1 Application Topical Once per day on Monday Wednesday Friday Alto Isaiah CROME, NP   1 Application at 06/04/24 0900   methylPREDNISolone  sodium succinate (SOLU-MEDROL ) 125 mg/2 mL injection 125 mg  125 mg Intravenous Once PRN Adriann Thau, Fallon E, NP       metoprolol  succinate (TOPROL -XL) 24 hr tablet 25 mg  25 mg Oral BID Gollan, Timothy J, MD   25 mg at 06/05/24 2122   midazolam  (VERSED ) 2 MG/ML syrup 8 mg  8 mg Oral  Once PRN Kayli Beal, Fallon E, NP       midodrine  (PROAMATINE ) tablet 5 mg  5 mg Oral TID WC Gollan, Timothy J, MD   5 mg at 06/05/24 1302   multivitamin (RENA-VIT) tablet 1 tablet  1 tablet Oral QHS Alto Isaiah CROME, NP   1 tablet at 06/05/24 2124   Oral care mouth rinse  15 mL Mouth Rinse PRN Bousman, Karlie, PA-C       Oral care mouth rinse  15 mL Mouth Rinse 4 times per day Kasa, Kurian, MD   15 mL at 06/03/24 2223   Oral care mouth rinse  15 mL Mouth Rinse PRN Kasa, Kurian, MD       oxyCODONE  (Oxy IR/ROXICODONE ) immediate release tablet 5 mg  5 mg Oral Q4H PRN Tukov-Yual, Magdalene S, NP   5 mg at 06/05/24 2122   pantoprazole  (PROTONIX ) EC tablet 40 mg  40 mg Oral BID Jens Durand, MD       sodium chloride  flush (NS) 0.9 % injection 3 mL  3 mL Intravenous Q12H Alto Isaiah CROME, NP   3 mL at 06/05/24 2124   thiamine  (VITAMIN B1) tablet 100 mg  100 mg Oral Daily Jens Durand, MD         Discharge Medications: Please see discharge summary for a list of discharge medications.  Relevant Imaging Results:  Relevant Lab Results:   Additional Information ss [id number]  Shasta DELENA Daring, RN     "

## 2024-06-06 NOTE — Discharge Summary (Addendum)
 " Physician Discharge Summary   Patient: Larry Morrison MRN: [id number] DOB: [birth year]  Admit date:     05/28/2024  Discharge date: 06/06/24  Discharge Physician: AIDA CHO   PCP: [facility]   Recommendations at discharge:   Follow-up with physician at the nursing home within 3 days of discharge  Discharge Diagnoses: Principal Problem:   Septic shock (HCC) Active Problems:   New onset a-fib (HCC)   Hypotension   HFrEF (heart failure with reduced ejection fraction) (HCC)   Nausea and vomiting   Anemia of chronic renal failure   End stage renal disease (HCC)   Hypertension   Protein calorie malnutrition   Shortness of breath   Goals of care, counseling/discussion   Hematemesis without nausea   Nephrostomy complication   General weakness   Complicated UTI (urinary tract infection)   Protein-calorie malnutrition, severe   ESRD on dialysis [facility])   Medical orders for scope of treatment form in chart  Resolved Problems:   * No resolved hospital problems. *  Hospital Course:   Larry Morrison is a 54 y.o. 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 He recently presented to [facility] ED on 12/30 due to weakness, nausea and vomiting.  There was some suspicion of more acute pancreatitis because of mildly elevated lipase but no specific etiology was identified.  He improved after arrival and was discharged back to the SNF where he resides.   He presented back to the ED on 05/28/2024 because of shortness of breath.  He was subsequently transferred to the ICU because of concern for septic shock secondary to complicated acute UTI in the setting of infected right nephrostomy tube.  He was started on IV vasopressors.  He was also treated with empiric  IV antibiotics.  There was also concern for aspiration pneumonitis. Urine and body fluid grew E. coli, Citrobacter, Proteus and Enterococcus faecalis.  Right nephrostomy tube was exchanged by IR on 06/02/2024.   He was transferred to [facility] hospitalist team on 06/03/2024.    Assessment and Plan:   Septic shock secondary to acute complicated UTI, infected right nephrostomy tube: Shock physiology has resolved.  S/p treatment with IV Levophed  infusion.  S/p exchange of right nephrostomy tube by IR on 06/02/2024.  ID specialist recommended Bactrim  single strength 1 tab twice daily through 06/09/2024.  Continuing IV ceftriaxone  while patient is in house and transitioning to Bactrim  at discharge through 06/09/2024. S/p treatment with broad-spectrum antibiotics including vancomycin , ceftriaxone , Flagyl  and Zosyn .  Body fluid culture showed E. coli, Citrobacter koseri, Proteus mirabilis and Enterococcus faecalis. Urine culture showed E. coli, Citrobacter koseri. No growth on blood cultures.     New onset atrial fibrillation with RVR: Heart rate is better.  Continue metoprolol .  Oral amiodarone  was discontinued by cardiologist because of prolonged QTc. S/p treatment with IV amiodarone  infusion. He is not on anticoagulation because of anemia     Prolonged QTc interval: Repeat EKG on 06/04/2021 showed QTc interval of 604.  Amiodarone  has been discontinued.  Avoid medications that prolong QTc interval as much as possible.     Hypotension: Continue midodrine . This limits the use of rate control medications/antihypertensives. Irbesartan  has been discontinued    Acute on chronic anemia, anemia of chronic disease: Hemoglobin improved from 8.5-11.3.  H&H stable.  Repeat  hemoglobin up to 11.8.   S/p transfusion with 2 units of PRBCs on 06/04/2024.       Nausea, vomiting, hematemesis: Improved.  Patient was scheduled for EGD on 06/04/2024 after hemodialysis.  However, he refused to be transported to the  endoscopy suite. Continue Protonix  at discharge     Chronic HFrEF: Compensated.  Irbesartan  discontinued because of soft blood pressure.  Metoprolol  replaced carvedilol  for rate control for A-fib. EF less than 20%.     ESRD on HD: Outpatient follow-up with her dialysis unit for hemodialysis as scheduled.     Hypokalemia: Improved.  Monitor electrolytes in the outpatient setting.     Severe protein calorie malnutrition: Continue nutritional supplements and thiamine .  Follow-up with dietitian.     Chronic debility, poor functional status, history of TBI: [name], NP, with palliative care team had discussed CODE STATUS with DSS on 06/05/2024. [name], legal guardian with DSS has made patient DNR.     His condition has improved and he is deemed stable for discharge to SNF today.      Consultants: Nephrologist, ID specialist, gastroenterologist, interventional radiologist Procedures performed: S/p right nephrostomy tube placement on 06/02/2024   Disposition: Skilled nursing facility Diet recommendation:  Discharge Diet Orders (From admission, onward)     Start     Ordered   06/06/24 0000  DIET DYS 3       Comments: Renal diet  Question:  Fluid consistency:  Answer:  Thin   06/06/24 1152           Dysphagia type 3 thin Liquid, renal diet DISCHARGE MEDICATION: Allergies as of 06/06/2024   No Known Allergies      Medication List     STOP taking these medications    carvedilol  6.25 MG tablet Commonly known as: COREG    irbesartan  75 MG tablet Commonly known as: AVAPRO    ondansetron  4 MG disintegrating tablet Commonly known as: ZOFRAN -ODT   Tegaderm Film 6x8 Misc       TAKE these medications    acetaminophen  325 MG tablet Commonly known as: TYLENOL  Take 2 tablets (650 mg total) by mouth every 6 (six) hours as needed for mild pain, fever or moderate pain.   albuterol  108 (90 Base) MCG/ACT inhaler Commonly known as: VENTOLIN  HFA Inhale 1-2 puffs into the  lungs every 4 (four) hours as needed for wheezing or shortness of breath.   aspirin  EC 81 MG tablet Take 1 tablet (81 mg total) by mouth daily. Swallow whole.   atorvastatin  40 MG tablet Commonly known as: LIPITOR Take 1 tablet (40 mg total) by mouth daily.   calcium  acetate 667 MG capsule Commonly known as: PHOSLO  Take 2 capsules (1,334 mg total) by mouth 3 (three) times daily with meals.   feeding supplement Liqd Take 237 mLs by mouth 2 (two) times daily between meals.   levothyroxine  50 MCG tablet Commonly known as: SYNTHROID  Take 50 mcg by mouth daily.   lidocaine -prilocaine  cream Commonly known as: EMLA  Apply 1 Application topically 3 (three) times a week. Apply to dialysis site Mon, Wed, Fri   metoprolol  succinate 25 MG 24 hr tablet Commonly known as: TOPROL -XL Take 1 tablet (25 mg total) by mouth 2 (two) times daily.   multivitamin Tabs tablet Take 1 tablet by mouth at bedtime.   pantoprazole  40 MG tablet Commonly known as: PROTONIX  Take 40 mg by mouth daily.   sulfamethoxazole -trimethoprim  400-80 MG tablet Commonly known as: Bactrim  Take 1 tablet by mouth 2 (two) times daily for  3 days.   thiamine  100 MG tablet Commonly known as: Vitamin B-1 Take 1 tablet (100 mg total) by mouth daily for 7 days.        Discharge Exam: Filed Weights   06/05/24 0515 06/06/24 0500 06/06/24 0800  Weight: 53.7 kg 55.3 kg 50.1 kg   GEN: NAD SKIN: Warm and dry EYES: No pallor or icterus ENT: MMM CV: RRR PULM: CTA B ABD: soft, ND, NT, +BS CNS: AAO x 2 (person and place), non focal EXT: No edema or tenderness   Condition at discharge: good  The results of significant diagnostics from this hospitalization (including imaging, microbiology, ancillary and laboratory) are listed below for reference.   Imaging Studies: DG Abd 1 View Result Date: 06/03/2024 CLINICAL DATA:  Projectile vomiting EXAM: ABDOMEN - 1 VIEW COMPARISON:  Two days ago FINDINGS: The bowel gas pattern  is normal. Stable right-sided nephrostomy. Left-sided surgical clips are noted. IMPRESSION: No abnormal bowel dilatation. Electronically Signed   By: Lynwood Landy Raddle M.D.   On: 06/03/2024 16:40   IR NEPHROSTOMY EXCHANGE RIGHT Result Date: 06/02/2024 INDICATION: Chronic indwelling right nephrostomy, routine exchange EXAM: FLUOROSCOPIC EXCHANGE OF THE RIGHT 10 FRENCH NEPHROSTOMY COMPARISON:  None Available. MEDICATIONS: 1% lidocaine  local ANESTHESIA/SEDATION: Moderate (conscious) sedation was employed during this procedure. A total of Versed  1.0 mg and Fentanyl  50 mcg was administered intravenously by the radiology nurse. Total intra-service moderate Sedation Time: 5 minutes. The patient's level of consciousness and vital signs were monitored continuously by radiology nursing throughout the procedure under my direct supervision. CONTRAST:  15 cc-administered into the collecting system(s) FLUOROSCOPY: Radiation Exposure Index (as provided by the fluoroscopic device): 5.0 mGy Kerma COMPLICATIONS: None immediate. PROCEDURE: Informed written consent was obtained from the patient after a thorough discussion of the procedural risks, benefits and alternatives. All questions were addressed. Maximal Sterile Barrier Technique was utilized including caps, mask, sterile gowns, sterile gloves, sterile drape, hand hygiene and skin antiseptic. A timeout was performed prior to the initiation of the procedure. Previous imaging reviewed. Preliminary injection confirms position of the nephrostomy within the renal pelvis. Successful exchange performed over an Amplatz guidewire. Retention loop formed in the renal pelvis. Contrast injection confirms position. Images again obtained for documentation. Patient tolerated the procedure well. No immediate complication. Catheter secured with a silk suture. Gravity drainage bag connected followed by sterile dressing. No immediate complication. Patient tolerated the procedure well. IMPRESSION:  Successful fluoroscopic exchange of the right 10 French nephrostomy Electronically Signed   By: CHRISTELLA.  Shick M.D.   On: 06/02/2024 14:17   CT ABDOMEN PELVIS WO CONTRAST Addendum Date: 06/01/2024 ADDENDUM REPORT: 06/01/2024 13:08 ADDENDUM: Not mentioned above is mild Pericystic edema which may represent residual cystitis. Electronically Signed   By: Rockey Kilts M.D.   On: 06/01/2024 13:08   Result Date: 06/01/2024 CLINICAL DATA:  Sepsis.  Evaluate position of nephrostomy tube. EXAM: CT ABDOMEN AND PELVIS WITHOUT CONTRAST TECHNIQUE: Multidetector CT imaging of the abdomen and pelvis was performed following the standard protocol without IV contrast. RADIATION DOSE REDUCTION: This exam was performed according to the departmental dose-optimization program which includes automated exposure control, adjustment of the mA and/or kV according to patient size and/or use of iterative reconstruction technique. COMPARISON:  11/27/2023 FINDINGS: Lower chest: Left base dependent airspace disease is similar. New or significantly progressive right base airspace and ground-glass opacity. 2 mm right lower lobe pulmonary nodule can be presumed benign and do/does not warrant imaging follow-up per Fleischner criteria. Moderate cardiomegaly. Similar small left  pleural effusion and pleural thickening. New trace right pleural fluid. Small pericardial effusion is unchanged. Dialysis catheter tip mid to low right atrium. Moderately dilated distal esophagus which is incompletely imaged but fluid-filled. Hepatobiliary: Dependent gallstones. Normal noncontrast appearance of the liver. No biliary duct dilatation or evidence of acute cholecystitis. Pancreas: Normal, without mass or ductal dilatation. Spleen: Normal in size, without focal abnormality. Adrenals/Urinary Tract: Normal adrenal glands. Absent left kidney. Moderate right renal atrophy. Right nephrostomy catheter is similar in position, without hydronephrosis or pericatheter fluid  collection. Mild distal right hydroureter is similar and likely physiologic. Mild pericystic edema is improved. Similar 1.2 cm focus of left bladder base heterogeneous increased density including on image 66/2. Stomach/Bowel: Normal stomach, without wall thickening. Fluid-filled colon. Normal terminal ileum and appendix. Multifactorial degradation, including EKG wires and leads, arm position, lack of oral or IV contrast. Normal small bowel. Vascular/Lymphatic: Aortic atherosclerosis. No abdominopelvic adenopathy. Reproductive: Normal prostate. Other: No significant free fluid.  No free intraperitoneal air. Musculoskeletal: Probable renal osteodystrophy. Mild convex left lumbar spine curvature. IMPRESSION: Multifactorial degradation, as detailed above. Appropriate position of right-sided nephrostomy catheter, within a moderately atrophic right kidney. Absent left kidney. Heterogeneous hyperattenuating left-sided bladder lesion is suspicious for urothelial carcinoma. Recommend cystoscopy if not already performed. New or increased right lower lobe airspace disease, suspicious for aspiration or infection. Dilated lower esophagus is fluid-filled, suggesting dysmotility and/or gastroesophageal reflux. This would predispose the patient to aspiration. Cholelithiasis. Fluid-filled colon, suggesting a diarrheal state. Aortic Atherosclerosis (ICD10-I70.0). Electronically Signed: By: Rockey Kilts M.D. On: 06/01/2024 12:56   DG Chest Port 1 View Result Date: 06/01/2024 CLINICAL DATA:  Right-sided rib pain. EXAM: PORTABLE CHEST 1 VIEW COMPARISON:  05/28/2024 FINDINGS: The cardio pericardial silhouette is enlarged. Similar left base collapse/consolidation with small left effusion. Similar patchy atelectasis or infiltrate at the right base with possible tiny right effusion. No overt pulmonary edema. Left IJ central line tip overlies the right atrium. Vascular stent device noted in each axillary region. Telemetry leads overlie  the chest. IMPRESSION: 1. Similar left base collapse/consolidation with small left effusion. 2. Similar patchy atelectasis or infiltrate at the right base with possible tiny right effusion. Electronically Signed   By: Camellia Candle M.D.   On: 06/01/2024 09:29   DG Abd 1 View Result Date: 06/01/2024 CLINICAL DATA:  Right sided rib and abdominal pain. EXAM: ABDOMEN - 1 VIEW COMPARISON:  04/16/2021 FINDINGS: Mild convex leftward lumbar scoliosis evident. Right percutaneous pigtail drain overlies the central right upper quadrant of the abdomen. Diffuse gas-filled bowel loops evident in a nonobstructive pattern. Gas is seen scattered along the length of the colon from the cecal tip down to the rectum. IMPRESSION: Diffuse gas-filled bowel loops in a nonobstructive pattern. Right percutaneous nephrostomy tube evident. Electronically Signed   By: Camellia Candle M.D.   On: 06/01/2024 09:28   ECHOCARDIOGRAM COMPLETE Result Date: 05/30/2024    ECHOCARDIOGRAM REPORT   Patient Name:   Larry Morrison Date of Exam: 05/30/2024 Medical Rec #:  [id number]    Height:       64.0 in Accession #:    [id number]   Weight:       109.1 lb Date of Birth:  03/15/1971     BSA:          1.512 m Patient Age:    53 years     BP:           95/63 mmHg Patient Gender: M  HR:           79 bpm. Exam Location:  [facility] Procedure: 2D Echo, Cardiac Doppler and Color Doppler (Both Spectral and Color            Flow Doppler were utilized during procedure). Indications:     Congestive Heart Failure I50.9  History:         Patient has prior history of Echocardiogram examinations, most                  recent 04/12/2021. CHF.  Sonographer:     Ashley McNeely-Sloane Referring Phys:  [id number] ROBET KIM Diagnosing Phys: Caron Poser  Sonographer Comments: Technically difficult study due to poor echo windows. Image acquisition challenging due to uncooperative patient. IMPRESSIONS  1. Limited views available due to patient cooperation. No apical windows.   2. Left ventricular ejection fraction, by estimation, is 35 to 40%. The left ventricle has moderately decreased function. Left ventricular endocardial border not optimally defined to evaluate regional wall motion. Left ventricular diastolic parameters are consistent with Grade I diastolic dysfunction (impaired relaxation).  3. Right ventricular systolic function is normal. The right ventricular size is normal.  4. There is no evidence of pericardial effusion. Unable to exclude an apical effusion given lack of apical window.  5. The mitral valve is normal in structure. Trivial mitral valve regurgitation. No evidence of mitral stenosis.  6. The aortic valve has an indeterminant number of cusps. There is mild calcification of the aortic valve. Aortic valve regurgitation is trivial. Aortic valve sclerosis/calcification is present, without any evidence of aortic stenosis.  7. The inferior vena cava is normal in size with greater than 50% respiratory variability, suggesting right atrial pressure of 3 mmHg. Comparison(s): A prior study was performed on 04/12/2021. LV function has improved based on limited views available. FINDINGS  Left Ventricle: Left ventricular ejection fraction, by estimation, is 35 to 40%. The left ventricle has moderately decreased function. Left ventricular endocardial border not optimally defined to evaluate regional wall motion. The left ventricular internal cavity size was normal in size. There is no left ventricular hypertrophy. Left ventricular diastolic parameters are consistent with Grade I diastolic dysfunction (impaired relaxation). Right Ventricle: The right ventricular size is normal. No increase in right ventricular wall thickness. Right ventricular systolic function is normal. Left Atrium: Left atrial size was not well visualized. Right Atrium: Right atrial size was not well visualized. Pericardium: There is no evidence of pericardial effusion. Mitral Valve: The mitral valve is normal  in structure. Trivial mitral valve regurgitation. No evidence of mitral valve stenosis. MV peak gradient, 3.6 mmHg. The mean mitral valve gradient is 2.0 mmHg. Tricuspid Valve: The tricuspid valve is normal in structure. Tricuspid valve regurgitation is trivial. Aortic Valve: The aortic valve has an indeterminant number of cusps. There is mild calcification of the aortic valve. Aortic valve regurgitation is trivial. Aortic valve sclerosis/calcification is present, without any evidence of aortic stenosis. Aortic valve mean gradient measures 3.0 mmHg. Aortic valve peak gradient measures 5.0 mmHg. Aortic valve area, by VTI measures 1.92 cm. Pulmonic Valve: The pulmonic valve was not well visualized. Pulmonic valve regurgitation is not visualized. No evidence of pulmonic stenosis. Aorta: The aortic root is normal in size and structure. Venous: The inferior vena cava is normal in size with greater than 50% respiratory variability, suggesting right atrial pressure of 3 mmHg. IAS/Shunts: The interatrial septum was not well visualized.  LEFT VENTRICLE PLAX 2D LVIDd:  4.60 cm   Diastology LVIDs:         2.90 cm   LV e' medial:    5.00 cm/s LV PW:         0.60 cm   LV E/e' medial:  13.0 LV IVS:        1.00 cm   LV e' lateral:   5.11 cm/s LVOT diam:     1.80 cm   LV E/e' lateral: 12.7 LV SV:         36 LV SV Index:   24 LVOT Area:     2.54 cm  RIGHT VENTRICLE RV S prime:     20.90 cm/s TAPSE (M-mode): 2.0 cm LEFT ATRIUM         Index LA diam:    2.80 cm 1.85 cm/m  AORTIC VALVE                    PULMONIC VALVE AV Area (Vmax):    1.81 cm     PV Vmax:        0.93 m/s AV Area (Vmean):   1.70 cm     PV Vmean:       61.600 cm/s AV Area (VTI):     1.92 cm     PV VTI:         0.174 m AV Vmax:           112.00 cm/s  PV Peak grad:   3.5 mmHg AV Vmean:          75.100 cm/s  PV Mean grad:   2.0 mmHg AV VTI:            0.186 m      RVOT Peak grad: 2 mmHg AV Peak Grad:      5.0 mmHg AV Mean Grad:      3.0 mmHg LVOT Vmax:          79.85 cm/s LVOT Vmean:        50.200 cm/s LVOT VTI:          0.140 m LVOT/AV VTI ratio: 0.76  AORTA Ao Root diam: 3.30 cm MITRAL VALVE MV Area (PHT): 3.17 cm    SHUNTS MV Area VTI:   1.72 cm    Systemic VTI:  0.14 m MV Peak grad:  3.6 mmHg    Systemic Diam: 1.80 cm MV Mean grad:  2.0 mmHg    Pulmonic VTI:  0.139 m MV Vmax:       0.95 m/s MV Vmean:      66.4 cm/s MV Decel Time: 239 msec MV E velocity: 64.90 cm/s MV A velocity: 93.50 cm/s MV E/A ratio:  0.69 Caron Poser Electronically signed by Caron Poser Signature Date/Time: 05/30/2024/1:17:00 PM    Final    DG Chest 2 View Result Date: 05/28/2024 EXAM: 2 VIEW(S) XRAY OF THE CHEST 05/28/2024 10:29:00 AM COMPARISON: Chest radiographs 09/23/2021 and earlier. CLINICAL HISTORY: 54 year old male. Due for renal replacement therapy. Worsening cough. Dialysis patient. FINDINGS: LINES, TUBES AND DEVICES: Left IJ dual lumen dialysis catheter in place with tip at cavoatrial junction. Right and left axillary vascular stents noted. Pigtail drainage catheter in place in right upper quadrant, likely retroperitoneal / renal related. LUNGS AND PLEURA: Mild pulmonary edema. Patchy airspace opacities in bilateral lower lobes. Moderate left pleural effusion appears chronic, stable since 2023. Small right pleural effusion has regressed since that time. No pneumothorax. HEART AND MEDIASTINUM: No acute abnormality of the cardiac and mediastinal silhouettes. BONES  AND SOFT TISSUES: No acute osseous abnormality. IMPRESSION: 1. Chronic pleural effusions, moderate and stable on the left and regressed on the right since 2023. No new cardiopulmonary abnormality. 2. Lines, tubes, and stents as above. Electronically signed by: Helayne Hurst MD 05/28/2024 10:43 AM EST RP Workstation: [id number]    Microbiology: Results for orders placed or performed during the hospital encounter of 05/28/24  Resp panel by RT-PCR (RSV, Flu A&B, Covid) Anterior Nasal Swab     Status: None   Collection  Time: 05/28/24 10:07 AM   Specimen: Anterior Nasal Swab  Result Value Ref Range Status   SARS Coronavirus 2 by RT PCR NEGATIVE NEGATIVE Final    Comment: (NOTE) SARS-CoV-2 target nucleic acids are NOT DETECTED.  The SARS-CoV-2 RNA is generally detectable in upper respiratory specimens during the acute phase of infection. The lowest concentration of SARS-CoV-2 viral copies this assay can detect is 138 copies/mL. A negative result does not preclude SARS-Cov-2 infection and should not be used as the sole basis for treatment or other patient management decisions. A negative result may occur with  improper specimen collection/handling, submission of specimen other than nasopharyngeal swab, presence of viral mutation(s) within the areas targeted by this assay, and inadequate number of viral copies(<138 copies/mL). A negative result must be combined with clinical observations, patient history, and epidemiological information. The expected result is Negative.  Fact Sheet for Patients:  bloggercourse.com  Fact Sheet for Healthcare Providers:  seriousbroker.it  This test is no t yet approved or cleared by the [place]  FDA and  has been authorized for detection and/or diagnosis of SARS-CoV-2 by FDA under an Emergency Use Authorization (EUA). This EUA will remain  in effect (meaning this test can be used) for the duration of the COVID-19 declaration under Section 564(b)(1) of the Act, 21 U.S.C.section 360bbb-3(b)(1), unless the authorization is terminated  or revoked sooner.       Influenza A by PCR NEGATIVE NEGATIVE Final   Influenza B by PCR NEGATIVE NEGATIVE Final    Comment: (NOTE) The Xpert Xpress SARS-CoV-2/FLU/RSV plus assay is intended as an aid in the diagnosis of influenza from Nasopharyngeal swab specimens and should not be used as a sole basis for treatment. Nasal washings and aspirates are unacceptable for Xpert Xpress  SARS-CoV-2/FLU/RSV testing.  Fact Sheet for Patients: bloggercourse.com  Fact Sheet for Healthcare Providers: seriousbroker.it  This test is not yet approved or cleared by the [place]  FDA and has been authorized for detection and/or diagnosis of SARS-CoV-2 by FDA under an Emergency Use Authorization (EUA). This EUA will remain in effect (meaning this test can be used) for the duration of the COVID-19 declaration under Section 564(b)(1) of the Act, 21 U.S.C. section 360bbb-3(b)(1), unless the authorization is terminated or revoked.     Resp Syncytial Virus by PCR NEGATIVE NEGATIVE Final    Comment: (NOTE) Fact Sheet for Patients: bloggercourse.com  Fact Sheet for Healthcare Providers: seriousbroker.it  This test is not yet approved or cleared by the [place]  FDA and has been authorized for detection and/or diagnosis of SARS-CoV-2 by FDA under an Emergency Use Authorization (EUA). This EUA will remain in effect (meaning this test can be used) for the duration of the COVID-19 declaration under Section 564(b)(1) of the Act, 21 U.S.C. section 360bbb-3(b)(1), unless the authorization is terminated or revoked.  Performed at [facility], [address] [place], [place] [address]   Respiratory (~20 pathogens) panel by PCR     Status: None   Collection  Time: 05/28/24 10:07 AM   Specimen: Nasopharyngeal Swab; Respiratory  Result Value Ref Range Status   Adenovirus NOT DETECTED NOT DETECTED Final   Coronavirus 229E NOT DETECTED NOT DETECTED Final    Comment: (NOTE) The Coronavirus on the Respiratory Panel, DOES NOT test for the novel  Coronavirus (2019 nCoV)    Coronavirus HKU1 NOT DETECTED NOT DETECTED Final   Coronavirus NL63 NOT DETECTED NOT DETECTED Final   Coronavirus OC43 NOT DETECTED NOT DETECTED Final   Metapneumovirus NOT DETECTED NOT DETECTED Final    Rhinovirus / Enterovirus NOT DETECTED NOT DETECTED Final   Influenza A NOT DETECTED NOT DETECTED Final   Influenza B NOT DETECTED NOT DETECTED Final   Parainfluenza Virus 1 NOT DETECTED NOT DETECTED Final   Parainfluenza Virus 2 NOT DETECTED NOT DETECTED Final   Parainfluenza Virus 3 NOT DETECTED NOT DETECTED Final   Parainfluenza Virus 4 NOT DETECTED NOT DETECTED Final   Respiratory Syncytial Virus NOT DETECTED NOT DETECTED Final   Bordetella pertussis NOT DETECTED NOT DETECTED Final   Bordetella Parapertussis NOT DETECTED NOT DETECTED Final   Chlamydophila pneumoniae NOT DETECTED NOT DETECTED Final   Mycoplasma pneumoniae NOT DETECTED NOT DETECTED Final    Comment: Performed at [facility], [address] [place], [place] [address]  Blood culture (routine x 2)     Status: None   Collection Time: 05/28/24 12:52 PM   Specimen: BLOOD  Result Value Ref Range Status   Specimen Description BLOOD RIGHT ANTECUBITAL  Final   Special Requests   Final    BOTTLES DRAWN AEROBIC AND ANAEROBIC Blood Culture results may not be optimal due to an inadequate volume of blood received in culture bottles   Culture   Final    NO GROWTH 5 DAYS Performed at [facility], [address] [place], [place] [address]    Report Status 06/02/2024 FINAL  Final  Urine Culture     Status: Abnormal   Collection Time: 05/28/24 12:52 PM   Specimen: Urine, Catheterized  Result Value Ref Range Status   Specimen Description   Final    URINE, CATHETERIZED Performed at [facility], [address] [place], [place] [address]    Special Requests   Final    NONE Performed at [facility], [address] [place], [place] [address]    Culture (A)  Final    >=100,000 COLONIES/mL ESCHERICHIA COLI >=100,000 COLONIES/mL CITROBACTER KOSERI    Report Status 05/31/2024 FINAL  Final   Organism ID, Bacteria ESCHERICHIA COLI (A)  Final   Organism ID, Bacteria CITROBACTER KOSERI (A)   Final      Susceptibility   Citrobacter koseri - MIC*    CEFEPIME  <=0.12 SENSITIVE Sensitive     ERTAPENEM <=0.12 SENSITIVE Sensitive     CEFTRIAXONE  <=0.25 SENSITIVE Sensitive     CIPROFLOXACIN  <=0.06 SENSITIVE Sensitive     GENTAMICIN <=1 SENSITIVE Sensitive     NITROFURANTOIN  32 SENSITIVE Sensitive     TRIMETH /SULFA  <=20 SENSITIVE Sensitive     PIP/TAZO Value in next row Sensitive      <=4 SENSITIVEThis is a modified FDA-approved test that has been validated and its performance characteristics determined by the reporting laboratory.  This laboratory is certified under the Clinical Laboratory Improvement Amendments CLIA as qualified to perform high complexity clinical laboratory testing.    MEROPENEM Value in next row Sensitive      <=4 SENSITIVEThis is a modified FDA-approved test that has been validated and its  performance characteristics determined by the reporting laboratory.  This laboratory is certified under the Clinical Laboratory Improvement Amendments CLIA as qualified to perform high complexity clinical laboratory testing.    * >=100,000 COLONIES/mL CITROBACTER KOSERI   Escherichia coli - MIC*    AMPICILLIN Value in next row Sensitive      <=4 SENSITIVEThis is a modified FDA-approved test that has been validated and its performance characteristics determined by the reporting laboratory.  This laboratory is certified under the Clinical Laboratory Improvement Amendments CLIA as qualified to perform high complexity clinical laboratory testing.    CEFAZOLIN  (URINE) Value in next row Sensitive      2 SENSITIVEThis is a modified FDA-approved test that has been validated and its performance characteristics determined by the reporting laboratory.  This laboratory is certified under the Clinical Laboratory Improvement Amendments CLIA as qualified to perform high complexity clinical laboratory testing.    CEFEPIME  Value in next row Sensitive      2 SENSITIVEThis is a modified FDA-approved  test that has been validated and its performance characteristics determined by the reporting laboratory.  This laboratory is certified under the Clinical Laboratory Improvement Amendments CLIA as qualified to perform high complexity clinical laboratory testing.    ERTAPENEM Value in next row Sensitive      2 SENSITIVEThis is a modified FDA-approved test that has been validated and its performance characteristics determined by the reporting laboratory.  This laboratory is certified under the Clinical Laboratory Improvement Amendments CLIA as qualified to perform high complexity clinical laboratory testing.    CEFTRIAXONE  Value in next row Sensitive      2 SENSITIVEThis is a modified FDA-approved test that has been validated and its performance characteristics determined by the reporting laboratory.  This laboratory is certified under the Clinical Laboratory Improvement Amendments CLIA as qualified to perform high complexity clinical laboratory testing.    CIPROFLOXACIN  Value in next row Sensitive      2 SENSITIVEThis is a modified FDA-approved test that has been validated and its performance characteristics determined by the reporting laboratory.  This laboratory is certified under the Clinical Laboratory Improvement Amendments CLIA as qualified to perform high complexity clinical laboratory testing.    GENTAMICIN Value in next row Sensitive      2 SENSITIVEThis is a modified FDA-approved test that has been validated and its performance characteristics determined by the reporting laboratory.  This laboratory is certified under the Clinical Laboratory Improvement Amendments CLIA as qualified to perform high complexity clinical laboratory testing.    NITROFURANTOIN  Value in next row Sensitive      2 SENSITIVEThis is a modified FDA-approved test that has been validated and its performance characteristics determined by the reporting laboratory.  This laboratory is certified under the Clinical Laboratory  Improvement Amendments CLIA as qualified to perform high complexity clinical laboratory testing.    TRIMETH /SULFA  Value in next row Sensitive      2 SENSITIVEThis is a modified FDA-approved test that has been validated and its performance characteristics determined by the reporting laboratory.  This laboratory is certified under the Clinical Laboratory Improvement Amendments CLIA as qualified to perform high complexity clinical laboratory testing.    AMPICILLIN/SULBACTAM Value in next row Sensitive      2 SENSITIVEThis is a modified FDA-approved test that has been validated and its performance characteristics determined by the reporting laboratory.  This laboratory is certified under the Clinical Laboratory Improvement Amendments CLIA as qualified to perform high complexity clinical laboratory testing.    PIP/TAZO Value  in next row Sensitive      <=4 SENSITIVEThis is a modified FDA-approved test that has been validated and its performance characteristics determined by the reporting laboratory.  This laboratory is certified under the Clinical Laboratory Improvement Amendments CLIA as qualified to perform high complexity clinical laboratory testing.    MEROPENEM Value in next row Sensitive      <=4 SENSITIVEThis is a modified FDA-approved test that has been validated and its performance characteristics determined by the reporting laboratory.  This laboratory is certified under the Clinical Laboratory Improvement Amendments CLIA as qualified to perform high complexity clinical laboratory testing.    * >=100,000 COLONIES/mL ESCHERICHIA COLI  Expectorated Sputum Assessment w Gram Stain, Rflx to Resp Cult     Status: None   Collection Time: 05/28/24  1:18 PM   Specimen: Salivary Gland; Sputum  Result Value Ref Range Status   Specimen Description SALIVA  Final   Special Requests EXPSU  Final   Sputum evaluation   Final    THIS SPECIMEN IS ACCEPTABLE FOR SPUTUM CULTURE Performed at [facility],  [address] [place], [place] [address]    Report Status 05/29/2024 FINAL  Final  Culture, Respiratory w Gram Stain     Status: None   Collection Time: 05/28/24  1:18 PM  Result Value Ref Range Status   Specimen Description EXPECTORATED SPUTUM  Final   Special Requests Reflexed from [id number]  Final   Gram Stain   Final    FEW WBC PRESENT, PREDOMINANTLY PMN RARE GRAM NEGATIVE RODS Performed at [facility], [address], [place], [place] [address]    Culture RARE CANDIDA ALBICANS  Final   Report Status 06/01/2024 FINAL  Final  MRSA Next Gen by PCR, Nasal     Status: None   Collection Time: 05/28/24  9:40 PM   Specimen: Nasal Mucosa; Nasal Swab  Result Value Ref Range Status   MRSA by PCR Next Gen NOT DETECTED NOT DETECTED Final    Comment: (NOTE) The GeneXpert MRSA Assay (FDA approved for NASAL specimens only), is one component of a comprehensive MRSA colonization surveillance program. It is not intended to diagnose MRSA infection nor to guide or monitor treatment for MRSA infections. Test performance is not FDA approved in patients less than 77 years old. Performed at [facility], [address] [place], [place] [address]   Blood culture (routine x 2)     Status: None   Collection Time: 05/28/24 10:24 PM   Specimen: BLOOD  Result Value Ref Range Status   Specimen Description BLOOD BLOOD RIGHT HAND  Final   Special Requests   Final    BOTTLES DRAWN AEROBIC ONLY Blood Culture results may not be optimal due to an inadequate volume of blood received in culture bottles   Culture   Final    NO GROWTH 5 DAYS Performed at [facility], [address], [place], [place] [address]    Report Status 06/02/2024 FINAL  Final  Body fluid culture w Gram Stain     Status: None   Collection Time: 05/29/24  8:00 AM   Specimen: Other Source; Body Fluid  Result Value Ref Range Status   Specimen Description   Final    OTHER Performed at [facility],  [address] [place], [place] [address]    Special Requests   Final    NONE Performed at [facility], [address] [place], [place] [address]    Gram  Stain   Final    FEW WBC PRESENT, PREDOMINANTLY PMN MODERATE GRAM POSITIVE COCCI FEW GRAM NEGATIVE RODS Performed at [facility], [address] [place], [place] [address]    Culture   Final    ABUNDANT ENTEROCOCCUS FAECALIS ABUNDANT ESCHERICHIA COLI ABUNDANT CITROBACTER KOSERI FEW PROTEUS MIRABILIS    Report Status 06/03/2024 FINAL  Final   Organism ID, Bacteria ESCHERICHIA COLI  Final   Organism ID, Bacteria CITROBACTER KOSERI  Final   Organism ID, Bacteria PROTEUS MIRABILIS  Final   Organism ID, Bacteria ENTEROCOCCUS FAECALIS  Final      Susceptibility   Citrobacter koseri - MIC*    CEFEPIME  <=0.12 SENSITIVE Sensitive     ERTAPENEM <=0.12 SENSITIVE Sensitive     CEFTRIAXONE  <=0.25 SENSITIVE Sensitive     CIPROFLOXACIN  <=0.06 SENSITIVE Sensitive     GENTAMICIN <=1 SENSITIVE Sensitive     MEROPENEM <=0.25 SENSITIVE Sensitive     TRIMETH /SULFA  <=20 SENSITIVE Sensitive     PIP/TAZO Value in next row Sensitive      <=4 SENSITIVEThis is a modified FDA-approved test that has been validated and its performance characteristics determined by the reporting laboratory.  This laboratory is certified under the Clinical Laboratory Improvement Amendments CLIA as qualified to perform high complexity clinical laboratory testing.    * ABUNDANT CITROBACTER KOSERI   Escherichia coli - MIC*    AMPICILLIN Value in next row Sensitive      <=4 SENSITIVEThis is a modified FDA-approved test that has been validated and its performance characteristics determined by the reporting laboratory.  This laboratory is certified under the Clinical Laboratory Improvement Amendments CLIA as qualified to perform high complexity clinical laboratory testing.    CEFAZOLIN  (NON-URINE) Value in next row Sensitive      <=4 SENSITIVEThis is a  modified FDA-approved test that has been validated and its performance characteristics determined by the reporting laboratory.  This laboratory is certified under the Clinical Laboratory Improvement Amendments CLIA as qualified to perform high complexity clinical laboratory testing.    CEFEPIME  Value in next row Sensitive      <=4 SENSITIVEThis is a modified FDA-approved test that has been validated and its performance characteristics determined by the reporting laboratory.  This laboratory is certified under the Clinical Laboratory Improvement Amendments CLIA as qualified to perform high complexity clinical laboratory testing.    ERTAPENEM Value in next row Sensitive      <=4 SENSITIVEThis is a modified FDA-approved test that has been validated and its performance characteristics determined by the reporting laboratory.  This laboratory is certified under the Clinical Laboratory Improvement Amendments CLIA as qualified to perform high complexity clinical laboratory testing.    CEFTRIAXONE  Value in next row Sensitive      <=4 SENSITIVEThis is a modified FDA-approved test that has been validated and its performance characteristics determined by the reporting laboratory.  This laboratory is certified under the Clinical Laboratory Improvement Amendments CLIA as qualified to perform high complexity clinical laboratory testing.    CIPROFLOXACIN  Value in next row Sensitive      <=4 SENSITIVEThis is a modified FDA-approved test that has been validated and its performance characteristics determined by the reporting laboratory.  This laboratory is certified under the Clinical Laboratory Improvement Amendments CLIA as qualified to perform high complexity clinical laboratory testing.    GENTAMICIN Value in next row Sensitive      <=4 SENSITIVEThis is a modified FDA-approved test that has been validated and its performance characteristics determined by the  reporting laboratory.  This laboratory is certified under the  Clinical Laboratory Improvement Amendments CLIA as qualified to perform high complexity clinical laboratory testing.    MEROPENEM Value in next row Sensitive      <=4 SENSITIVEThis is a modified FDA-approved test that has been validated and its performance characteristics determined by the reporting laboratory.  This laboratory is certified under the Clinical Laboratory Improvement Amendments CLIA as qualified to perform high complexity clinical laboratory testing.    TRIMETH /SULFA  Value in next row Sensitive      <=4 SENSITIVEThis is a modified FDA-approved test that has been validated and its performance characteristics determined by the reporting laboratory.  This laboratory is certified under the Clinical Laboratory Improvement Amendments CLIA as qualified to perform high complexity clinical laboratory testing.    AMPICILLIN/SULBACTAM Value in next row Sensitive      <=4 SENSITIVEThis is a modified FDA-approved test that has been validated and its performance characteristics determined by the reporting laboratory.  This laboratory is certified under the Clinical Laboratory Improvement Amendments CLIA as qualified to perform high complexity clinical laboratory testing.    PIP/TAZO Value in next row Sensitive      <=4 SENSITIVEThis is a modified FDA-approved test that has been validated and its performance characteristics determined by the reporting laboratory.  This laboratory is certified under the Clinical Laboratory Improvement Amendments CLIA as qualified to perform high complexity clinical laboratory testing.    * ABUNDANT ESCHERICHIA COLI   Enterococcus faecalis - MIC*    AMPICILLIN Value in next row Sensitive      <=4 SENSITIVEThis is a modified FDA-approved test that has been validated and its performance characteristics determined by the reporting laboratory.  This laboratory is certified under the Clinical Laboratory Improvement Amendments CLIA as qualified to perform high complexity  clinical laboratory testing.    VANCOMYCIN  Value in next row Sensitive      <=4 SENSITIVEThis is a modified FDA-approved test that has been validated and its performance characteristics determined by the reporting laboratory.  This laboratory is certified under the Clinical Laboratory Improvement Amendments CLIA as qualified to perform high complexity clinical laboratory testing.    GENTAMICIN SYNERGY Value in next row Sensitive      <=4 SENSITIVEThis is a modified FDA-approved test that has been validated and its performance characteristics determined by the reporting laboratory.  This laboratory is certified under the Clinical Laboratory Improvement Amendments CLIA as qualified to perform high complexity clinical laboratory testing.    * ABUNDANT ENTEROCOCCUS FAECALIS   Proteus mirabilis - MIC*    AMPICILLIN Value in next row Sensitive      <=4 SENSITIVEThis is a modified FDA-approved test that has been validated and its performance characteristics determined by the reporting laboratory.  This laboratory is certified under the Clinical Laboratory Improvement Amendments CLIA as qualified to perform high complexity clinical laboratory testing.    CEFAZOLIN  (NON-URINE) Value in next row Intermediate      <=4 SENSITIVEThis is a modified FDA-approved test that has been validated and its performance characteristics determined by the reporting laboratory.  This laboratory is certified under the Clinical Laboratory Improvement Amendments CLIA as qualified to perform high complexity clinical laboratory testing.    CEFEPIME  Value in next row Sensitive      <=4 SENSITIVEThis is a modified FDA-approved test that has been validated and its performance characteristics determined by the reporting laboratory.  This laboratory is certified under the Clinical Laboratory Improvement Amendments CLIA as qualified to perform high complexity  clinical laboratory testing.    ERTAPENEM Value in next row Sensitive      <=4  SENSITIVEThis is a modified FDA-approved test that has been validated and its performance characteristics determined by the reporting laboratory.  This laboratory is certified under the Clinical Laboratory Improvement Amendments CLIA as qualified to perform high complexity clinical laboratory testing.    CEFTRIAXONE  Value in next row Sensitive      <=4 SENSITIVEThis is a modified FDA-approved test that has been validated and its performance characteristics determined by the reporting laboratory.  This laboratory is certified under the Clinical Laboratory Improvement Amendments CLIA as qualified to perform high complexity clinical laboratory testing.    CIPROFLOXACIN  Value in next row Sensitive      <=4 SENSITIVEThis is a modified FDA-approved test that has been validated and its performance characteristics determined by the reporting laboratory.  This laboratory is certified under the Clinical Laboratory Improvement Amendments CLIA as qualified to perform high complexity clinical laboratory testing.    GENTAMICIN Value in next row Sensitive      <=4 SENSITIVEThis is a modified FDA-approved test that has been validated and its performance characteristics determined by the reporting laboratory.  This laboratory is certified under the Clinical Laboratory Improvement Amendments CLIA as qualified to perform high complexity clinical laboratory testing.    MEROPENEM Value in next row Sensitive      <=4 SENSITIVEThis is a modified FDA-approved test that has been validated and its performance characteristics determined by the reporting laboratory.  This laboratory is certified under the Clinical Laboratory Improvement Amendments CLIA as qualified to perform high complexity clinical laboratory testing.    TRIMETH /SULFA  Value in next row Sensitive      <=4 SENSITIVEThis is a modified FDA-approved test that has been validated and its performance characteristics determined by the reporting laboratory.  This laboratory  is certified under the Clinical Laboratory Improvement Amendments CLIA as qualified to perform high complexity clinical laboratory testing.    AMPICILLIN/SULBACTAM Value in next row Sensitive      <=4 SENSITIVEThis is a modified FDA-approved test that has been validated and its performance characteristics determined by the reporting laboratory.  This laboratory is certified under the Clinical Laboratory Improvement Amendments CLIA as qualified to perform high complexity clinical laboratory testing.    PIP/TAZO Value in next row Sensitive      <=4 SENSITIVEThis is a modified FDA-approved test that has been validated and its performance characteristics determined by the reporting laboratory.  This laboratory is certified under the Clinical Laboratory Improvement Amendments CLIA as qualified to perform high complexity clinical laboratory testing.    * FEW PROTEUS MIRABILIS    Labs: CBC: Recent Labs  Lab 05/31/24 0350 06/01/24 0323 06/02/24 0537 06/03/24 0551 06/03/24 1117 06/03/24 2344 06/04/24 0830 06/05/24 0008 06/05/24 0500  WBC 12.7* 14.1* 15.1* 10.7*  --   --  8.6  --  11.9*  NEUTROABS 9.9*  --   --   --   --   --   --   --   --   HGB 8.1* 7.5* 8.1* 7.9* 8.5* 7.6* 8.5* 11.3* 11.8*  HCT 26.4* 24.1* 27.1* 25.6* 27.2* 24.5* 27.4* 34.6* 37.5*  MCV 103.1* 104.8* 107.1* 106.7*  --   --  98.9  --  101.1*  PLT 200 195 220 261  --   --  207  --  215   Basic Metabolic Panel: Recent Labs  Lab 05/31/24 0350 06/01/24 0323 06/02/24 0537 06/03/24 0551 06/04/24 0830 06/06/24 0500  NA 134* 135 140 141 137  --   K 3.6 4.1 3.6 3.5 3.2* 3.4  CL 93* 93* 92* 94* 96*  --   CO2 23 21* 23 20* 26  --   GLUCOSE 88 60* 76 80 67*  --   BUN 20 32* 46* 59* 21*  --   CREATININE 5.46* 8.45* 11.50* 13.90* 7.04*  --   CALCIUM  8.7* 9.0 9.2 9.3 8.5*  --   MG  --  2.2 2.3 2.4  --   --   PHOS 3.7 5.3*  --  6.5* 4.2 4.8*   Liver Function Tests: Recent Labs  Lab 05/31/24 0350 06/01/24 0323  06/03/24 0551 06/04/24 0830  AST  --   --  24  --   ALT  --   --  24  --   ALKPHOS  --   --  67  --   BILITOT  --   --  0.4  --   PROT  --   --  7.9  --   ALBUMIN  3.0* 2.9* 3.0* 2.7*   CBG: Recent Labs  Lab 05/31/24 0735 06/01/24 0552 06/01/24 0623 06/01/24 0751 06/03/24 1021  GLUCAP 96 63* 127* 83 87    Discharge time spent: greater than 30 minutes.  Signed: AIDA CHO, MD [facility] 06/06/2024 "

## 2024-06-06 NOTE — Progress Notes (Signed)
 D/c orders noted. Contacted pt out-pt hd clinic, [facility] MWF 10:45am to inform of pt d/c. D/c summary has been faxed over. No further assistance needed at this time.   Suzen Satchel Dialysis Navigator [phone]

## 2024-06-06 NOTE — Progress Notes (Signed)
 Attempted to contact Larry Morrison to clarify transport with no answer at number that was on file.

## 2024-06-06 NOTE — TOC Progression Note (Signed)
 Transition of Care [facility]) - Progression Note    Patient Details  Name: Kam Rahimi MRN: [id number] Date of Birth: November 27, 1970  Transition of Care [facility]) CM/SW Contact  Shasta DELENA Daring, RN Phone Number: 06/06/2024, 11:38 AM  Clinical Narrative:    JANAS complete Spoke with Daphne, nurse at facility. She requests FL2 and discharge summary at discharge. Requested we send it to [email or web address] or fax to [phone].  Spoke to [employer]. She will pick patient up when discharged.        Expected Discharge Plan: Assisted Living Barriers to Discharge: Continued Medical Work up               Expected Discharge Plan and Services       Living arrangements for the past 2 months: Assisted Living Facility                                       Social Drivers of Health (SDOH) Interventions SDOH Screenings   Food Insecurity: No Food Insecurity (05/29/2024)  Housing: Low Risk (05/29/2024)  Transportation Needs: No Transportation Needs (05/29/2024)  Utilities: Not At Risk (05/29/2024)  Social Connections: Socially Isolated (03/05/2024)  Tobacco Use: High Risk (05/28/2024)    Readmission Risk Interventions    05/29/2024    1:43 PM  Readmission Risk Prevention Plan  Transportation Screening Complete  Medication Review (RN Care Manager) Complete  PCP or Specialist appointment within 3-5 days of discharge Complete  HRI or Home Care Consult Complete  SW Recovery Care/Counseling Consult Complete  Palliative Care Screening Not Applicable  Skilled Nursing Facility Not Applicable

## 2024-06-07 DIAGNOSIS — A419 Sepsis, unspecified organism: Secondary | ICD-10-CM | POA: Diagnosis not present

## 2024-06-07 DIAGNOSIS — R6521 Severe sepsis with septic shock: Secondary | ICD-10-CM | POA: Diagnosis not present

## 2024-06-07 NOTE — Progress Notes (Addendum)
 Spoke with Lawanda,[phone]) she attempted to set up transport with [facility] this morning. [facility] stated that since patient was a hospital discharge that the hospital needed to set up transportation. Lennette provided [facility]'s phone number. [phone]. Case manager is arranging transport. Patient is going to [address] in [place]. [phone]. Transportation set up. Will arrive between 11:15-2pm. Lawanda and patient updated on time.   Called [facility] for ETA- unable to give me a time. Gave me direct number to [facility] who is providing transport. Patient next in line on list.

## 2024-06-07 NOTE — Progress Notes (Signed)
 Patient refusing medications and vital signs. Educated patient on the importance of taking medications and monitoring vitals. Patient voiced understanding but refused

## 2024-06-07 NOTE — TOC Transition Note (Addendum)
 Transition of Care [facility]) - Discharge Note   Patient Details  Name: Larry Morrison MRN: [id number] Date of Birth: August 30, 1970  Transition of Care [facility]) CM/SW Contact:  Victory Jackquline RAMAN, RN Phone Number: 06/07/2024, 11:10 AM   Clinical Narrative:    Received a message from the bedside nurse asking me to set up transportation via secure chat because patient was supposed to be picked up yesterday by [facility]. Transportation set up via [facility]. Spoke to [place] @ [phone], Trip# [id number]. Will be here between now and 2 pm. MD and bedside nurse made aware. No further concerns. RNCM signing off.   Final next level of care: Assisted Living Barriers to Discharge: Barriers Resolved   Patient Goals and CMS Choice            Discharge Placement                  Name of family member notified: [name] and Zyanna with DSS Patient and family notified of of transfer: 06/06/24  Discharge Plan and Services Additional resources added to the After Visit Summary for                                       Social Drivers of Health (SDOH) Interventions SDOH Screenings   Food Insecurity: No Food Insecurity (05/29/2024)  Housing: Low Risk (05/29/2024)  Transportation Needs: No Transportation Needs (05/29/2024)  Utilities: Not At Risk (05/29/2024)  Social Connections: Socially Isolated (03/05/2024)  Tobacco Use: High Risk (05/28/2024)     Readmission Risk Interventions    05/29/2024    1:43 PM  Readmission Risk Prevention Plan  Transportation Screening Complete  Medication Review (RN Care Manager) Complete  PCP or Specialist appointment within 3-5 days of discharge Complete  HRI or Home Care Consult Complete  SW Recovery Care/Counseling Consult Complete  Palliative Care Screening Not Applicable  Skilled Nursing Facility Not Applicable

## 2024-06-07 NOTE — Discharge Summary (Signed)
 " Physician Discharge Summary   Patient: Larry Morrison MRN: [id number] DOB: [birth year]  Admit date:     05/28/2024  Discharge date: 06/07/24  Discharge Physician: Larry Morrison   PCP: [facility]   Recommendations at discharge:    Follow-up with physician at the nursing home within 3 days of discharge   Discharge Diagnoses: Principal Problem:   Septic shock (HCC) Active Problems:   New onset a-fib (HCC)   Hypotension   HFrEF (heart failure with reduced ejection fraction) (HCC)   Nausea and vomiting   Anemia of chronic renal failure   End stage renal disease (HCC)   Hypertension   Protein calorie malnutrition   Shortness of breath   Goals of care, counseling/discussion   Hematemesis without nausea   Nephrostomy complication   General weakness   Complicated UTI (urinary tract infection)   Protein-calorie malnutrition, severe   ESRD on dialysis [facility])   Medical orders for scope of treatment form in chart  Resolved Problems:   * No resolved hospital problems. *  Hospital Course:  Larry Morrison is a 54 y.o. 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 He recently presented to [facility] ED on 12/30 due to weakness, nausea and vomiting.  There was some suspicion of more acute pancreatitis because of mildly elevated lipase but no specific etiology was identified.  He improved after arrival and was discharged back to the SNF where he resides.   He presented back to the ED on 05/28/2024 because of shortness of breath.  He was subsequently transferred to the ICU because of concern for septic shock secondary to complicated acute UTI in the setting of infected right nephrostomy tube.  He was started on IV vasopressors.  He was also treated with empiric  IV antibiotics.  There was also concern for aspiration pneumonitis. Urine and body fluid grew E. coli, Citrobacter, Proteus and Enterococcus faecalis.  Right nephrostomy tube was exchanged by IR on 06/02/2024.   He was transferred to [facility] hospitalist team on 06/03/2024.      Assessment and Plan:   Septic shock secondary to acute complicated UTI, infected right nephrostomy tube: Shock physiology has resolved.  S/p treatment with IV Levophed  infusion.  S/p exchange of right nephrostomy tube by IR on 06/02/2024.  ID specialist recommended Bactrim  single strength 1 tab twice daily through 06/09/2024.  Continuing IV ceftriaxone  while patient is in house and transitioning to Bactrim  at discharge through 06/09/2024. S/p treatment with broad-spectrum antibiotics including vancomycin , ceftriaxone , Flagyl  and Zosyn .   Body fluid culture showed E. coli, Citrobacter koseri, Proteus mirabilis and Enterococcus faecalis. Urine culture showed E. coli, Citrobacter koseri. No growth on blood cultures.     New onset atrial fibrillation with RVR: Heart rate is better.  Continue metoprolol .  Oral amiodarone  was discontinued by cardiologist because of prolonged QTc. S/p treatment with IV amiodarone  infusion. He is not on anticoagulation because of anemia     Prolonged QTc interval: Repeat EKG on 06/04/2021 showed QTc interval of 604.  Amiodarone  has been discontinued.  Avoid medications that prolong QTc interval as much as possible.     Hypotension: BP stable.  Continue midodrine . This limits the use of rate control medications/antihypertensives. Irbesartan  has been discontinued     Acute on chronic anemia, anemia of chronic disease: Hemoglobin  improved from 8.5-11.3.  H&H stable.  Repeat hemoglobin up to 11.8.   S/p transfusion with 2 units of PRBCs on 06/04/2024.       Nausea, vomiting, hematemesis: Improved.  Patient was scheduled for EGD on 06/04/2024 after hemodialysis.  However, he refused to be  transported to the endoscopy suite. Continue Protonix  at discharge     Chronic HFrEF: Compensated.  Irbesartan  discontinued because of soft blood pressure.  Metoprolol  replaced carvedilol  for rate control for A-fib. EF less than 20%.     ESRD on HD: Outpatient follow-up with outpatient dialysis center for hemodialysis as scheduled.     Hypokalemia: Improved.  Monitor electrolytes in the outpatient setting.     Severe protein calorie malnutrition: Continue nutritional supplements and thiamine .  Follow-up with dietitian.     Chronic debility, poor functional status, history of TBI: [name], NP, with palliative care team had discussed CODE STATUS with DSS on 06/05/2024. Larry Morrison, legal guardian with DSS has made patient DNR.      His condition has improved and he is deemed stable for discharge to SNF today.            Consultants: Nephrologist, ID specialist, gastroenterologist, interventional radiologist Procedures performed: S/p right nephrostomy tube placement on 06/02/2024    Disposition: Skilled nursing facility Diet recommendation:  Discharge Diet Orders (From admission, onward)     Start     Ordered   06/06/24 0000  DIET DYS 3       Comments: Renal diet  Question:  Fluid consistency:  Answer:  Thin   06/06/24 1152           Dysphagia type 3 thin Liquid, renal diet DISCHARGE MEDICATION: Allergies as of 06/07/2024   No Known Allergies      Medication List     STOP taking these medications    carvedilol  6.25 MG tablet Commonly known as: COREG    irbesartan  75 MG tablet Commonly known as: AVAPRO    ondansetron  4 MG disintegrating tablet Commonly known as: ZOFRAN -ODT   Tegaderm Film 6x8 Misc       TAKE these medications    acetaminophen  325 MG tablet Commonly known as: TYLENOL  Take 2 tablets (650 mg total) by mouth every 6 (six) hours as needed for mild pain, fever or moderate pain.   albuterol  108 (90 Base) MCG/ACT inhaler Commonly known as:  VENTOLIN  HFA Inhale 1-2 puffs into the lungs every 4 (four) hours as needed for wheezing or shortness of breath.   aspirin  EC 81 MG tablet Take 1 tablet (81 mg total) by mouth daily. Swallow whole.   atorvastatin  40 MG tablet Commonly known as: LIPITOR Take 1 tablet (40 mg total) by mouth daily.   calcium  acetate 667 MG capsule Commonly known as: PHOSLO  Take 2 capsules (1,334 mg total) by mouth 3 (three) times daily with meals.   feeding supplement Liqd Take 237 mLs by mouth 2 (two) times daily between meals.   levothyroxine  50 MCG tablet Commonly known as: SYNTHROID  Take 50 mcg by mouth daily.   lidocaine -prilocaine  cream Commonly known as: EMLA  Apply 1 Application topically 3 (three) times a week. Apply to dialysis site Mon, Wed, Fri   metoprolol  succinate 25 MG 24 hr tablet Commonly known as: TOPROL -XL Take 1 tablet (25 mg total) by mouth 2 (two) times daily.   multivitamin Tabs tablet Take 1 tablet by mouth at bedtime.   pantoprazole  40 MG tablet Commonly known as: PROTONIX  Take 40 mg by mouth daily.   sulfamethoxazole -trimethoprim  400-80  MG tablet Commonly known as: Bactrim  Take 1 tablet by mouth 2 (two) times daily for 3 days.   thiamine  100 MG tablet Commonly known as: VITAMIN B1 Take 1 tablet (100 mg total) by mouth daily for 7 days.        Discharge Exam: Filed Weights   06/06/24 0500 06/06/24 0800 06/07/24 0500  Weight: 55.3 kg 50.1 kg 46.9 kg   GEN: NAD SKIN: Warm and dry EYES: No pallor or icterus ENT: MMM CV: RRR PULM: CTA B ABD: soft, ND, NT, +BS CNS: AAO x 2 (person and place), non focal EXT: No edema or tenderness   Condition at discharge: good  The results of significant diagnostics from this hospitalization (including imaging, microbiology, ancillary and laboratory) are listed below for reference.   Imaging Studies: DG Abd 1 View Result Date: 06/03/2024 CLINICAL DATA:  Projectile vomiting EXAM: ABDOMEN - 1 VIEW COMPARISON:  Two  days ago FINDINGS: The bowel gas pattern is normal. Stable right-sided nephrostomy. Left-sided surgical clips are noted. IMPRESSION: No abnormal bowel dilatation. Electronically Signed   By: Lynwood Landy Raddle M.D.   On: 06/03/2024 16:40   IR NEPHROSTOMY EXCHANGE RIGHT Result Date: 06/02/2024 INDICATION: Chronic indwelling right nephrostomy, routine exchange EXAM: FLUOROSCOPIC EXCHANGE OF THE RIGHT 10 FRENCH NEPHROSTOMY COMPARISON:  None Available. MEDICATIONS: 1% lidocaine  local ANESTHESIA/SEDATION: Moderate (conscious) sedation was employed during this procedure. A total of Versed  1.0 mg and Fentanyl  50 mcg was administered intravenously by the radiology nurse. Total intra-service moderate Sedation Time: 5 minutes. The patient's level of consciousness and vital signs were monitored continuously by radiology nursing throughout the procedure under my direct supervision. CONTRAST:  15 cc-administered into the collecting system(s) FLUOROSCOPY: Radiation Exposure Index (as provided by the fluoroscopic device): 5.0 mGy Kerma COMPLICATIONS: None immediate. PROCEDURE: Informed written consent was obtained from the patient after a thorough discussion of the procedural risks, benefits and alternatives. All questions were addressed. Maximal Sterile Barrier Technique was utilized including caps, mask, sterile gowns, sterile gloves, sterile drape, hand hygiene and skin antiseptic. A timeout was performed prior to the initiation of the procedure. Previous imaging reviewed. Preliminary injection confirms position of the nephrostomy within the renal pelvis. Successful exchange performed over an Amplatz guidewire. Retention loop formed in the renal pelvis. Contrast injection confirms position. Images again obtained for documentation. Patient tolerated the procedure well. No immediate complication. Catheter secured with a silk suture. Gravity drainage bag connected followed by sterile dressing. No immediate complication. Patient  tolerated the procedure well. IMPRESSION: Successful fluoroscopic exchange of the right 10 French nephrostomy Electronically Signed   By: CHRISTELLA.  Shick M.D.   On: 06/02/2024 14:17   CT ABDOMEN PELVIS WO CONTRAST Addendum Date: 06/01/2024 ADDENDUM REPORT: 06/01/2024 13:08 ADDENDUM: Not mentioned above is mild Pericystic edema which may represent residual cystitis. Electronically Signed   By: Rockey Kilts M.D.   On: 06/01/2024 13:08   Result Date: 06/01/2024 CLINICAL DATA:  Sepsis.  Evaluate position of nephrostomy tube. EXAM: CT ABDOMEN AND PELVIS WITHOUT CONTRAST TECHNIQUE: Multidetector CT imaging of the abdomen and pelvis was performed following the standard protocol without IV contrast. RADIATION DOSE REDUCTION: This exam was performed according to the departmental dose-optimization program which includes automated exposure control, adjustment of the mA and/or kV according to patient size and/or use of iterative reconstruction technique. COMPARISON:  11/27/2023 FINDINGS: Lower chest: Left base dependent airspace disease is similar. New or significantly progressive right base airspace and ground-glass opacity. 2 mm right lower lobe pulmonary nodule can be  presumed benign and do/does not warrant imaging follow-up per Fleischner criteria. Moderate cardiomegaly. Similar small left pleural effusion and pleural thickening. New trace right pleural fluid. Small pericardial effusion is unchanged. Dialysis catheter tip mid to low right atrium. Moderately dilated distal esophagus which is incompletely imaged but fluid-filled. Hepatobiliary: Dependent gallstones. Normal noncontrast appearance of the liver. No biliary duct dilatation or evidence of acute cholecystitis. Pancreas: Normal, without mass or ductal dilatation. Spleen: Normal in size, without focal abnormality. Adrenals/Urinary Tract: Normal adrenal glands. Absent left kidney. Moderate right renal atrophy. Right nephrostomy catheter is similar in position, without  hydronephrosis or pericatheter fluid collection. Mild distal right hydroureter is similar and likely physiologic. Mild pericystic edema is improved. Similar 1.2 cm focus of left bladder base heterogeneous increased density including on image 66/2. Stomach/Bowel: Normal stomach, without wall thickening. Fluid-filled colon. Normal terminal ileum and appendix. Multifactorial degradation, including EKG wires and leads, arm position, lack of oral or IV contrast. Normal small bowel. Vascular/Lymphatic: Aortic atherosclerosis. No abdominopelvic adenopathy. Reproductive: Normal prostate. Other: No significant free fluid.  No free intraperitoneal air. Musculoskeletal: Probable renal osteodystrophy. Mild convex left lumbar spine curvature. IMPRESSION: Multifactorial degradation, as detailed above. Appropriate position of right-sided nephrostomy catheter, within a moderately atrophic right kidney. Absent left kidney. Heterogeneous hyperattenuating left-sided bladder lesion is suspicious for urothelial carcinoma. Recommend cystoscopy if not already performed. New or increased right lower lobe airspace disease, suspicious for aspiration or infection. Dilated lower esophagus is fluid-filled, suggesting dysmotility and/or gastroesophageal reflux. This would predispose the patient to aspiration. Cholelithiasis. Fluid-filled colon, suggesting a diarrheal state. Aortic Atherosclerosis (ICD10-I70.0). Electronically Signed: By: Rockey Kilts M.D. On: 06/01/2024 12:56   DG Chest Port 1 View Result Date: 06/01/2024 CLINICAL DATA:  Right-sided rib pain. EXAM: PORTABLE CHEST 1 VIEW COMPARISON:  05/28/2024 FINDINGS: The cardio pericardial silhouette is enlarged. Similar left base collapse/consolidation with small left effusion. Similar patchy atelectasis or infiltrate at the right base with possible tiny right effusion. No overt pulmonary edema. Left IJ central line tip overlies the right atrium. Vascular stent device noted in each  axillary region. Telemetry leads overlie the chest. IMPRESSION: 1. Similar left base collapse/consolidation with small left effusion. 2. Similar patchy atelectasis or infiltrate at the right base with possible tiny right effusion. Electronically Signed   By: Camellia Candle M.D.   On: 06/01/2024 09:29   DG Abd 1 View Result Date: 06/01/2024 CLINICAL DATA:  Right sided rib and abdominal pain. EXAM: ABDOMEN - 1 VIEW COMPARISON:  04/16/2021 FINDINGS: Mild convex leftward lumbar scoliosis evident. Right percutaneous pigtail drain overlies the central right upper quadrant of the abdomen. Diffuse gas-filled bowel loops evident in a nonobstructive pattern. Gas is seen scattered along the length of the colon from the cecal tip down to the rectum. IMPRESSION: Diffuse gas-filled bowel loops in a nonobstructive pattern. Right percutaneous nephrostomy tube evident. Electronically Signed   By: Camellia Candle M.D.   On: 06/01/2024 09:28   ECHOCARDIOGRAM COMPLETE Result Date: 05/30/2024    ECHOCARDIOGRAM REPORT   Patient Name:   ULISES WOLFINGER Date of Exam: 05/30/2024 Medical Rec #:  [id number]    Height:       64.0 in Accession #:    [id number]   Weight:       109.1 lb Date of Birth:  13-Apr-1971     BSA:          1.512 m Patient Age:    53 years     BP:  95/63 mmHg Patient Gender: M            HR:           79 bpm. Exam Location:  [facility] Procedure: 2D Echo, Cardiac Doppler and Color Doppler (Both Spectral and Color            Flow Doppler were utilized during procedure). Indications:     Congestive Heart Failure I50.9  History:         Patient has prior history of Echocardiogram examinations, most                  recent 04/12/2021. CHF.  Sonographer:     Ashley McNeely-Sloane Referring Phys:  [id number] ROBET KIM Diagnosing Phys: Caron Poser  Sonographer Comments: Technically difficult study due to poor echo windows. Image acquisition challenging due to uncooperative patient. IMPRESSIONS  1. Limited views available due  to patient cooperation. No apical windows.  2. Left ventricular ejection fraction, by estimation, is 35 to 40%. The left ventricle has moderately decreased function. Left ventricular endocardial border not optimally defined to evaluate regional wall motion. Left ventricular diastolic parameters are consistent with Grade I diastolic dysfunction (impaired relaxation).  3. Right ventricular systolic function is normal. The right ventricular size is normal.  4. There is no evidence of pericardial effusion. Unable to exclude an apical effusion given lack of apical window.  5. The mitral valve is normal in structure. Trivial mitral valve regurgitation. No evidence of mitral stenosis.  6. The aortic valve has an indeterminant number of cusps. There is mild calcification of the aortic valve. Aortic valve regurgitation is trivial. Aortic valve sclerosis/calcification is present, without any evidence of aortic stenosis.  7. The inferior vena cava is normal in size with greater than 50% respiratory variability, suggesting right atrial pressure of 3 mmHg. Comparison(s): A prior study was performed on 04/12/2021. LV function has improved based on limited views available. FINDINGS  Left Ventricle: Left ventricular ejection fraction, by estimation, is 35 to 40%. The left ventricle has moderately decreased function. Left ventricular endocardial border not optimally defined to evaluate regional wall motion. The left ventricular internal cavity size was normal in size. There is no left ventricular hypertrophy. Left ventricular diastolic parameters are consistent with Grade I diastolic dysfunction (impaired relaxation). Right Ventricle: The right ventricular size is normal. No increase in right ventricular wall thickness. Right ventricular systolic function is normal. Left Atrium: Left atrial size was not well visualized. Right Atrium: Right atrial size was not well visualized. Pericardium: There is no evidence of pericardial  effusion. Mitral Valve: The mitral valve is normal in structure. Trivial mitral valve regurgitation. No evidence of mitral valve stenosis. MV peak gradient, 3.6 mmHg. The mean mitral valve gradient is 2.0 mmHg. Tricuspid Valve: The tricuspid valve is normal in structure. Tricuspid valve regurgitation is trivial. Aortic Valve: The aortic valve has an indeterminant number of cusps. There is mild calcification of the aortic valve. Aortic valve regurgitation is trivial. Aortic valve sclerosis/calcification is present, without any evidence of aortic stenosis. Aortic valve mean gradient measures 3.0 mmHg. Aortic valve peak gradient measures 5.0 mmHg. Aortic valve area, by VTI measures 1.92 cm. Pulmonic Valve: The pulmonic valve was not well visualized. Pulmonic valve regurgitation is not visualized. No evidence of pulmonic stenosis. Aorta: The aortic root is normal in size and structure. Venous: The inferior vena cava is normal in size with greater than 50% respiratory variability, suggesting right atrial pressure of 3 mmHg. IAS/Shunts: The interatrial septum was  not well visualized.  LEFT VENTRICLE PLAX 2D LVIDd:         4.60 cm   Diastology LVIDs:         2.90 cm   LV e' medial:    5.00 cm/s LV PW:         0.60 cm   LV E/e' medial:  13.0 LV IVS:        1.00 cm   LV e' lateral:   5.11 cm/s LVOT diam:     1.80 cm   LV E/e' lateral: 12.7 LV SV:         36 LV SV Index:   24 LVOT Area:     2.54 cm  RIGHT VENTRICLE RV S prime:     20.90 cm/s TAPSE (M-mode): 2.0 cm LEFT ATRIUM         Index LA diam:    2.80 cm 1.85 cm/m  AORTIC VALVE                    PULMONIC VALVE AV Area (Vmax):    1.81 cm     PV Vmax:        0.93 m/s AV Area (Vmean):   1.70 cm     PV Vmean:       61.600 cm/s AV Area (VTI):     1.92 cm     PV VTI:         0.174 m AV Vmax:           112.00 cm/s  PV Peak grad:   3.5 mmHg AV Vmean:          75.100 cm/s  PV Mean grad:   2.0 mmHg AV VTI:            0.186 m      RVOT Peak grad: 2 mmHg AV Peak Grad:       5.0 mmHg AV Mean Grad:      3.0 mmHg LVOT Vmax:         79.85 cm/s LVOT Vmean:        50.200 cm/s LVOT VTI:          0.140 m LVOT/AV VTI ratio: 0.76  AORTA Ao Root diam: 3.30 cm MITRAL VALVE MV Area (PHT): 3.17 cm    SHUNTS MV Area VTI:   1.72 cm    Systemic VTI:  0.14 m MV Peak grad:  3.6 mmHg    Systemic Diam: 1.80 cm MV Mean grad:  2.0 mmHg    Pulmonic VTI:  0.139 m MV Vmax:       0.95 m/s MV Vmean:      66.4 cm/s MV Decel Time: 239 msec MV E velocity: 64.90 cm/s MV A velocity: 93.50 cm/s MV E/A ratio:  0.69 Caron Poser Electronically signed by Caron Poser Signature Date/Time: 05/30/2024/1:17:00 PM    Final    DG Chest 2 View Result Date: 05/28/2024 EXAM: 2 VIEW(S) XRAY OF THE CHEST 05/28/2024 10:29:00 AM COMPARISON: Chest radiographs 09/23/2021 and earlier. CLINICAL HISTORY: 54 year old male. Due for renal replacement therapy. Worsening cough. Dialysis patient. FINDINGS: LINES, TUBES AND DEVICES: Left IJ dual lumen dialysis catheter in place with tip at cavoatrial junction. Right and left axillary vascular stents noted. Pigtail drainage catheter in place in right upper quadrant, likely retroperitoneal / renal related. LUNGS AND PLEURA: Mild pulmonary edema. Patchy airspace opacities in bilateral lower lobes. Moderate left pleural effusion appears chronic, stable since 2023. Small right pleural effusion has regressed since  that time. No pneumothorax. HEART AND MEDIASTINUM: No acute abnormality of the cardiac and mediastinal silhouettes. BONES AND SOFT TISSUES: No acute osseous abnormality. IMPRESSION: 1. Chronic pleural effusions, moderate and stable on the left and regressed on the right since 2023. No new cardiopulmonary abnormality. 2. Lines, tubes, and stents as above. Electronically signed by: Helayne Hurst MD 05/28/2024 10:43 AM EST RP Workstation: [id number]    Microbiology: Results for orders placed or performed during the hospital encounter of 05/28/24  Resp panel by RT-PCR (RSV, Flu A&B,  Covid) Anterior Nasal Swab     Status: None   Collection Time: 05/28/24 10:07 AM   Specimen: Anterior Nasal Swab  Result Value Ref Range Status   SARS Coronavirus 2 by RT PCR NEGATIVE NEGATIVE Final    Comment: (NOTE) SARS-CoV-2 target nucleic acids are NOT DETECTED.  The SARS-CoV-2 RNA is generally detectable in upper respiratory specimens during the acute phase of infection. The lowest concentration of SARS-CoV-2 viral copies this assay can detect is 138 copies/mL. A negative result does not preclude SARS-Cov-2 infection and should not be used as the sole basis for treatment or other patient management decisions. A negative result may occur with  improper specimen collection/handling, submission of specimen other than nasopharyngeal swab, presence of viral mutation(s) within the areas targeted by this assay, and inadequate number of viral copies(<138 copies/mL). A negative result must be combined with clinical observations, patient history, and epidemiological information. The expected result is Negative.  Fact Sheet for Patients:  bloggercourse.com  Fact Sheet for Healthcare Providers:  seriousbroker.it  This test is no t yet approved or cleared by the [place]  FDA and  has been authorized for detection and/or diagnosis of SARS-CoV-2 by FDA under an Emergency Use Authorization (EUA). This EUA will remain  in effect (meaning this test can be used) for the duration of the COVID-19 declaration under Section 564(b)(1) of the Act, 21 U.S.C.section 360bbb-3(b)(1), unless the authorization is terminated  or revoked sooner.       Influenza A by PCR NEGATIVE NEGATIVE Final   Influenza B by PCR NEGATIVE NEGATIVE Final    Comment: (NOTE) The Xpert Xpress SARS-CoV-2/FLU/RSV plus assay is intended as an aid in the diagnosis of influenza from Nasopharyngeal swab specimens and should not be used as a sole basis for treatment. Nasal  washings and aspirates are unacceptable for Xpert Xpress SARS-CoV-2/FLU/RSV testing.  Fact Sheet for Patients: bloggercourse.com  Fact Sheet for Healthcare Providers: seriousbroker.it  This test is not yet approved or cleared by the [place]  FDA and has been authorized for detection and/or diagnosis of SARS-CoV-2 by FDA under an Emergency Use Authorization (EUA). This EUA will remain in effect (meaning this test can be used) for the duration of the COVID-19 declaration under Section 564(b)(1) of the Act, 21 U.S.C. section 360bbb-3(b)(1), unless the authorization is terminated or revoked.     Resp Syncytial Virus by PCR NEGATIVE NEGATIVE Final    Comment: (NOTE) Fact Sheet for Patients: bloggercourse.com  Fact Sheet for Healthcare Providers: [email or web address]  This test is not yet approved or cleared by the [place]  FDA and has been authorized for detection and/or diagnosis of SARS-CoV-2 by FDA under an Emergency Use Authorization (EUA). This EUA will remain in effect (meaning this test can be used) for the duration of the COVID-19 declaration under Section 564(b)(1) of the Act, 21 U.S.C. section 360bbb-3(b)(1), unless the authorization is terminated or revoked.  Performed at [facility], [address], [place], [place] [address]  Respiratory (~20 pathogens) panel by PCR     Status: None   Collection Time: 05/28/24 10:07 AM   Specimen: Nasopharyngeal Swab; Respiratory  Result Value Ref Range Status   Adenovirus NOT DETECTED NOT DETECTED Final   Coronavirus 229E NOT DETECTED NOT DETECTED Final    Comment: (NOTE) The Coronavirus on the Respiratory Panel, DOES NOT test for the novel  Coronavirus (2019 nCoV)    Coronavirus HKU1 NOT DETECTED NOT DETECTED Final   Coronavirus NL63 NOT DETECTED NOT DETECTED Final   Coronavirus OC43 NOT DETECTED NOT DETECTED  Final   Metapneumovirus NOT DETECTED NOT DETECTED Final   Rhinovirus / Enterovirus NOT DETECTED NOT DETECTED Final   Influenza A NOT DETECTED NOT DETECTED Final   Influenza B NOT DETECTED NOT DETECTED Final   Parainfluenza Virus 1 NOT DETECTED NOT DETECTED Final   Parainfluenza Virus 2 NOT DETECTED NOT DETECTED Final   Parainfluenza Virus 3 NOT DETECTED NOT DETECTED Final   Parainfluenza Virus 4 NOT DETECTED NOT DETECTED Final   Respiratory Syncytial Virus NOT DETECTED NOT DETECTED Final   Bordetella pertussis NOT DETECTED NOT DETECTED Final   Bordetella Parapertussis NOT DETECTED NOT DETECTED Final   Chlamydophila pneumoniae NOT DETECTED NOT DETECTED Final   Mycoplasma pneumoniae NOT DETECTED NOT DETECTED Final    Comment: Performed at [facility], [address] [place], [place] [address]  Blood culture (routine x 2)     Status: None   Collection Time: 05/28/24 12:52 PM   Specimen: BLOOD  Result Value Ref Range Status   Specimen Description BLOOD RIGHT ANTECUBITAL  Final   Special Requests   Final    BOTTLES DRAWN AEROBIC AND ANAEROBIC Blood Culture results may not be optimal due to an inadequate volume of blood received in culture bottles   Culture   Final    NO GROWTH 5 DAYS Performed at [facility], [address] [place], [place] [address]    Report Status 06/02/2024 FINAL  Final  Urine Culture     Status: Abnormal   Collection Time: 05/28/24 12:52 PM   Specimen: Urine, Catheterized  Result Value Ref Range Status   Specimen Description   Final    URINE, CATHETERIZED Performed at [facility], [address] [place], [place] [address]    Special Requests   Final    NONE Performed at [facility], [address] [place], [place] [address]    Culture (A)  Final    >=100,000 COLONIES/mL ESCHERICHIA COLI >=100,000 COLONIES/mL CITROBACTER KOSERI    Report Status 05/31/2024 FINAL  Final   Organism ID, Bacteria ESCHERICHIA COLI (A)   Final   Organism ID, Bacteria CITROBACTER KOSERI (A)  Final      Susceptibility   Citrobacter koseri - MIC*    CEFEPIME  <=0.12 SENSITIVE Sensitive     ERTAPENEM <=0.12 SENSITIVE Sensitive     CEFTRIAXONE  <=0.25 SENSITIVE Sensitive     CIPROFLOXACIN  <=0.06 SENSITIVE Sensitive     GENTAMICIN <=1 SENSITIVE Sensitive     NITROFURANTOIN  32 SENSITIVE Sensitive     TRIMETH /SULFA  <=20 SENSITIVE Sensitive     PIP/TAZO Value in next row Sensitive      <=4 SENSITIVEThis is a modified FDA-approved test that has been validated and its performance characteristics determined by the reporting laboratory.  This laboratory is certified under the Clinical Laboratory Improvement Amendments CLIA as qualified to perform high complexity clinical laboratory testing.    MEROPENEM Value in next row Sensitive      <=  4 SENSITIVEThis is a modified FDA-approved test that has been validated and its performance characteristics determined by the reporting laboratory.  This laboratory is certified under the Clinical Laboratory Improvement Amendments CLIA as qualified to perform high complexity clinical laboratory testing.    * >=100,000 COLONIES/mL CITROBACTER KOSERI   Escherichia coli - MIC*    AMPICILLIN Value in next row Sensitive      <=4 SENSITIVEThis is a modified FDA-approved test that has been validated and its performance characteristics determined by the reporting laboratory.  This laboratory is certified under the Clinical Laboratory Improvement Amendments CLIA as qualified to perform high complexity clinical laboratory testing.    CEFAZOLIN  (URINE) Value in next row Sensitive      2 SENSITIVEThis is a modified FDA-approved test that has been validated and its performance characteristics determined by the reporting laboratory.  This laboratory is certified under the Clinical Laboratory Improvement Amendments CLIA as qualified to perform high complexity clinical laboratory testing.    CEFEPIME  Value in next row  Sensitive      2 SENSITIVEThis is a modified FDA-approved test that has been validated and its performance characteristics determined by the reporting laboratory.  This laboratory is certified under the Clinical Laboratory Improvement Amendments CLIA as qualified to perform high complexity clinical laboratory testing.    ERTAPENEM Value in next row Sensitive      2 SENSITIVEThis is a modified FDA-approved test that has been validated and its performance characteristics determined by the reporting laboratory.  This laboratory is certified under the Clinical Laboratory Improvement Amendments CLIA as qualified to perform high complexity clinical laboratory testing.    CEFTRIAXONE  Value in next row Sensitive      2 SENSITIVEThis is a modified FDA-approved test that has been validated and its performance characteristics determined by the reporting laboratory.  This laboratory is certified under the Clinical Laboratory Improvement Amendments CLIA as qualified to perform high complexity clinical laboratory testing.    CIPROFLOXACIN  Value in next row Sensitive      2 SENSITIVEThis is a modified FDA-approved test that has been validated and its performance characteristics determined by the reporting laboratory.  This laboratory is certified under the Clinical Laboratory Improvement Amendments CLIA as qualified to perform high complexity clinical laboratory testing.    GENTAMICIN Value in next row Sensitive      2 SENSITIVEThis is a modified FDA-approved test that has been validated and its performance characteristics determined by the reporting laboratory.  This laboratory is certified under the Clinical Laboratory Improvement Amendments CLIA as qualified to perform high complexity clinical laboratory testing.    NITROFURANTOIN  Value in next row Sensitive      2 SENSITIVEThis is a modified FDA-approved test that has been validated and its performance characteristics determined by the reporting laboratory.  This  laboratory is certified under the Clinical Laboratory Improvement Amendments CLIA as qualified to perform high complexity clinical laboratory testing.    TRIMETH /SULFA  Value in next row Sensitive      2 SENSITIVEThis is a modified FDA-approved test that has been validated and its performance characteristics determined by the reporting laboratory.  This laboratory is certified under the Clinical Laboratory Improvement Amendments CLIA as qualified to perform high complexity clinical laboratory testing.    AMPICILLIN/SULBACTAM Value in next row Sensitive      2 SENSITIVEThis is a modified FDA-approved test that has been validated and its performance characteristics determined by the reporting laboratory.  This laboratory is certified under the Clinical Laboratory Improvement Amendments CLIA as  qualified to perform high complexity clinical laboratory testing.    PIP/TAZO Value in next row Sensitive      <=4 SENSITIVEThis is a modified FDA-approved test that has been validated and its performance characteristics determined by the reporting laboratory.  This laboratory is certified under the Clinical Laboratory Improvement Amendments CLIA as qualified to perform high complexity clinical laboratory testing.    MEROPENEM Value in next row Sensitive      <=4 SENSITIVEThis is a modified FDA-approved test that has been validated and its performance characteristics determined by the reporting laboratory.  This laboratory is certified under the Clinical Laboratory Improvement Amendments CLIA as qualified to perform high complexity clinical laboratory testing.    * >=100,000 COLONIES/mL ESCHERICHIA COLI  Expectorated Sputum Assessment w Gram Stain, Rflx to Resp Cult     Status: None   Collection Time: 05/28/24  1:18 PM   Specimen: Salivary Gland; Sputum  Result Value Ref Range Status   Specimen Description SALIVA  Final   Special Requests EXPSU  Final   Sputum evaluation   Final    THIS SPECIMEN IS ACCEPTABLE FOR  SPUTUM CULTURE Performed at [facility], [address] [place], [place] [address]    Report Status 05/29/2024 FINAL  Final  Culture, Respiratory w Gram Stain     Status: None   Collection Time: 05/28/24  1:18 PM  Result Value Ref Range Status   Specimen Description EXPECTORATED SPUTUM  Final   Special Requests Reflexed from [id number]  Final   Gram Stain   Final    FEW WBC PRESENT, PREDOMINANTLY PMN RARE GRAM NEGATIVE RODS Performed at [facility], [address], [place], [place] [address]    Culture RARE CANDIDA ALBICANS  Final   Report Status 06/01/2024 FINAL  Final  MRSA Next Gen by PCR, Nasal     Status: None   Collection Time: 05/28/24  9:40 PM   Specimen: Nasal Mucosa; Nasal Swab  Result Value Ref Range Status   MRSA by PCR Next Gen NOT DETECTED NOT DETECTED Final    Comment: (NOTE) The GeneXpert MRSA Assay (FDA approved for NASAL specimens only), is one component of a comprehensive MRSA colonization surveillance program. It is not intended to diagnose MRSA infection nor to guide or monitor treatment for MRSA infections. Test performance is not FDA approved in patients less than 6 years old. Performed at [facility], [address] [place], [place] [address]   Blood culture (routine x 2)     Status: None   Collection Time: 05/28/24 10:24 PM   Specimen: BLOOD  Result Value Ref Range Status   Specimen Description BLOOD BLOOD RIGHT HAND  Final   Special Requests   Final    BOTTLES DRAWN AEROBIC ONLY Blood Culture results may not be optimal due to an inadequate volume of blood received in culture bottles   Culture   Final    NO GROWTH 5 DAYS Performed at [facility], [address] [place], [place] [address]    Report Status 06/02/2024 FINAL  Final  Body fluid culture w Gram Stain     Status: None   Collection Time: 05/29/24  8:00 AM   Specimen: Other Source; Body Fluid  Result Value Ref Range Status   Specimen Description    Final    OTHER Performed at [facility], [address] [place], [place] [address]    Special Requests   Final    NONE Performed at [facility], [address], [place], [place] [address]    Gram Stain   Final    FEW WBC PRESENT, PREDOMINANTLY PMN MODERATE GRAM POSITIVE COCCI FEW GRAM NEGATIVE RODS Performed at [facility], [address] [place], [place] [address]    Culture   Final    ABUNDANT ENTEROCOCCUS FAECALIS ABUNDANT ESCHERICHIA COLI ABUNDANT CITROBACTER KOSERI FEW PROTEUS MIRABILIS    Report Status 06/03/2024 FINAL  Final   Organism ID, Bacteria ESCHERICHIA COLI  Final   Organism ID, Bacteria CITROBACTER KOSERI  Final   Organism ID, Bacteria PROTEUS MIRABILIS  Final   Organism ID, Bacteria ENTEROCOCCUS FAECALIS  Final      Susceptibility   Citrobacter koseri - MIC*    CEFEPIME  <=0.12 SENSITIVE Sensitive     ERTAPENEM <=0.12 SENSITIVE Sensitive     CEFTRIAXONE  <=0.25 SENSITIVE Sensitive     CIPROFLOXACIN  <=0.06 SENSITIVE Sensitive     GENTAMICIN <=1 SENSITIVE Sensitive     MEROPENEM <=0.25 SENSITIVE Sensitive     TRIMETH /SULFA  <=20 SENSITIVE Sensitive     PIP/TAZO Value in next row Sensitive      <=4 SENSITIVEThis is a modified FDA-approved test that has been validated and its performance characteristics determined by the reporting laboratory.  This laboratory is certified under the Clinical Laboratory Improvement Amendments CLIA as qualified to perform high complexity clinical laboratory testing.    * ABUNDANT CITROBACTER KOSERI   Escherichia coli - MIC*    AMPICILLIN Value in next row Sensitive      <=4 SENSITIVEThis is a modified FDA-approved test that has been validated and its performance characteristics determined by the reporting laboratory.  This laboratory is certified under the Clinical Laboratory Improvement Amendments CLIA as qualified to perform high complexity clinical laboratory testing.    CEFAZOLIN  (NON-URINE) Value in  next row Sensitive      <=4 SENSITIVEThis is a modified FDA-approved test that has been validated and its performance characteristics determined by the reporting laboratory.  This laboratory is certified under the Clinical Laboratory Improvement Amendments CLIA as qualified to perform high complexity clinical laboratory testing.    CEFEPIME  Value in next row Sensitive      <=4 SENSITIVEThis is a modified FDA-approved test that has been validated and its performance characteristics determined by the reporting laboratory.  This laboratory is certified under the Clinical Laboratory Improvement Amendments CLIA as qualified to perform high complexity clinical laboratory testing.    ERTAPENEM Value in next row Sensitive      <=4 SENSITIVEThis is a modified FDA-approved test that has been validated and its performance characteristics determined by the reporting laboratory.  This laboratory is certified under the Clinical Laboratory Improvement Amendments CLIA as qualified to perform high complexity clinical laboratory testing.    CEFTRIAXONE  Value in next row Sensitive      <=4 SENSITIVEThis is a modified FDA-approved test that has been validated and its performance characteristics determined by the reporting laboratory.  This laboratory is certified under the Clinical Laboratory Improvement Amendments CLIA as qualified to perform high complexity clinical laboratory testing.    CIPROFLOXACIN  Value in next row Sensitive      <=4 SENSITIVEThis is a modified FDA-approved test that has been validated and its performance characteristics determined by the reporting laboratory.  This laboratory is certified under the Clinical Laboratory Improvement Amendments CLIA as qualified to perform high complexity clinical laboratory testing.    GENTAMICIN Value in next row Sensitive      <=4 SENSITIVEThis is a modified  FDA-approved test that has been validated and its performance characteristics determined by the reporting  laboratory.  This laboratory is certified under the Clinical Laboratory Improvement Amendments CLIA as qualified to perform high complexity clinical laboratory testing.    MEROPENEM Value in next row Sensitive      <=4 SENSITIVEThis is a modified FDA-approved test that has been validated and its performance characteristics determined by the reporting laboratory.  This laboratory is certified under the Clinical Laboratory Improvement Amendments CLIA as qualified to perform high complexity clinical laboratory testing.    TRIMETH /SULFA  Value in next row Sensitive      <=4 SENSITIVEThis is a modified FDA-approved test that has been validated and its performance characteristics determined by the reporting laboratory.  This laboratory is certified under the Clinical Laboratory Improvement Amendments CLIA as qualified to perform high complexity clinical laboratory testing.    AMPICILLIN/SULBACTAM Value in next row Sensitive      <=4 SENSITIVEThis is a modified FDA-approved test that has been validated and its performance characteristics determined by the reporting laboratory.  This laboratory is certified under the Clinical Laboratory Improvement Amendments CLIA as qualified to perform high complexity clinical laboratory testing.    PIP/TAZO Value in next row Sensitive      <=4 SENSITIVEThis is a modified FDA-approved test that has been validated and its performance characteristics determined by the reporting laboratory.  This laboratory is certified under the Clinical Laboratory Improvement Amendments CLIA as qualified to perform high complexity clinical laboratory testing.    * ABUNDANT ESCHERICHIA COLI   Enterococcus faecalis - MIC*    AMPICILLIN Value in next row Sensitive      <=4 SENSITIVEThis is a modified FDA-approved test that has been validated and its performance characteristics determined by the reporting laboratory.  This laboratory is certified under the Clinical Laboratory Improvement Amendments  CLIA as qualified to perform high complexity clinical laboratory testing.    VANCOMYCIN  Value in next row Sensitive      <=4 SENSITIVEThis is a modified FDA-approved test that has been validated and its performance characteristics determined by the reporting laboratory.  This laboratory is certified under the Clinical Laboratory Improvement Amendments CLIA as qualified to perform high complexity clinical laboratory testing.    GENTAMICIN SYNERGY Value in next row Sensitive      <=4 SENSITIVEThis is a modified FDA-approved test that has been validated and its performance characteristics determined by the reporting laboratory.  This laboratory is certified under the Clinical Laboratory Improvement Amendments CLIA as qualified to perform high complexity clinical laboratory testing.    * ABUNDANT ENTEROCOCCUS FAECALIS   Proteus mirabilis - MIC*    AMPICILLIN Value in next row Sensitive      <=4 SENSITIVEThis is a modified FDA-approved test that has been validated and its performance characteristics determined by the reporting laboratory.  This laboratory is certified under the Clinical Laboratory Improvement Amendments CLIA as qualified to perform high complexity clinical laboratory testing.    CEFAZOLIN  (NON-URINE) Value in next row Intermediate      <=4 SENSITIVEThis is a modified FDA-approved test that has been validated and its performance characteristics determined by the reporting laboratory.  This laboratory is certified under the Clinical Laboratory Improvement Amendments CLIA as qualified to perform high complexity clinical laboratory testing.    CEFEPIME  Value in next row Sensitive      <=4 SENSITIVEThis is a modified FDA-approved test that has been validated and its performance characteristics determined by the reporting laboratory.  This laboratory is certified  under the Clinical Laboratory Improvement Amendments CLIA as qualified to perform high complexity clinical laboratory testing.     ERTAPENEM Value in next row Sensitive      <=4 SENSITIVEThis is a modified FDA-approved test that has been validated and its performance characteristics determined by the reporting laboratory.  This laboratory is certified under the Clinical Laboratory Improvement Amendments CLIA as qualified to perform high complexity clinical laboratory testing.    CEFTRIAXONE  Value in next row Sensitive      <=4 SENSITIVEThis is a modified FDA-approved test that has been validated and its performance characteristics determined by the reporting laboratory.  This laboratory is certified under the Clinical Laboratory Improvement Amendments CLIA as qualified to perform high complexity clinical laboratory testing.    CIPROFLOXACIN  Value in next row Sensitive      <=4 SENSITIVEThis is a modified FDA-approved test that has been validated and its performance characteristics determined by the reporting laboratory.  This laboratory is certified under the Clinical Laboratory Improvement Amendments CLIA as qualified to perform high complexity clinical laboratory testing.    GENTAMICIN Value in next row Sensitive      <=4 SENSITIVEThis is a modified FDA-approved test that has been validated and its performance characteristics determined by the reporting laboratory.  This laboratory is certified under the Clinical Laboratory Improvement Amendments CLIA as qualified to perform high complexity clinical laboratory testing.    MEROPENEM Value in next row Sensitive      <=4 SENSITIVEThis is a modified FDA-approved test that has been validated and its performance characteristics determined by the reporting laboratory.  This laboratory is certified under the Clinical Laboratory Improvement Amendments CLIA as qualified to perform high complexity clinical laboratory testing.    TRIMETH /SULFA  Value in next row Sensitive      <=4 SENSITIVEThis is a modified FDA-approved test that has been validated and its performance characteristics  determined by the reporting laboratory.  This laboratory is certified under the Clinical Laboratory Improvement Amendments CLIA as qualified to perform high complexity clinical laboratory testing.    AMPICILLIN/SULBACTAM Value in next row Sensitive      <=4 SENSITIVEThis is a modified FDA-approved test that has been validated and its performance characteristics determined by the reporting laboratory.  This laboratory is certified under the Clinical Laboratory Improvement Amendments CLIA as qualified to perform high complexity clinical laboratory testing.    PIP/TAZO Value in next row Sensitive      <=4 SENSITIVEThis is a modified FDA-approved test that has been validated and its performance characteristics determined by the reporting laboratory.  This laboratory is certified under the Clinical Laboratory Improvement Amendments CLIA as qualified to perform high complexity clinical laboratory testing.    * FEW PROTEUS MIRABILIS    Labs: CBC: Recent Labs  Lab 06/01/24 0323 06/02/24 0537 06/03/24 0551 06/03/24 1117 06/03/24 2344 06/04/24 0830 06/05/24 0008 06/05/24 0500  WBC 14.1* 15.1* 10.7*  --   --  8.6  --  11.9*  HGB 7.5* 8.1* 7.9* 8.5* 7.6* 8.5* 11.3* 11.8*  HCT 24.1* 27.1* 25.6* 27.2* 24.5* 27.4* 34.6* 37.5*  MCV 104.8* 107.1* 106.7*  --   --  98.9  --  101.1*  PLT 195 220 261  --   --  207  --  215   Basic Metabolic Panel: Recent Labs  Lab 06/01/24 0323 06/02/24 0537 06/03/24 0551 06/04/24 0830 06/05/24 0500 06/06/24 0500  NA 135 140 141 137 141  --   K 4.1 3.6 3.5 3.2* 3.6 3.4  CL 93*  92* 94* 96* 99  --   CO2 21* 23 20* 26 24  --   GLUCOSE 60* 76 80 67* 80  --   BUN 32* 46* 59* 21* 20  --   CREATININE 8.45* 11.50* 13.90* 7.04* 7.94*  --   CALCIUM  9.0 9.2 9.3 8.5* 8.7*  --   MG 2.2 2.3 2.4  --  2.2  --   PHOS 5.3*  --  6.5* 4.2  --  4.8*   Liver Function Tests: Recent Labs  Lab 06/01/24 0323 06/03/24 0551 06/04/24 0830  AST  --  24  --   ALT  --  24  --    ALKPHOS  --  67  --   BILITOT  --  0.4  --   PROT  --  7.9  --   ALBUMIN  2.9* 3.0* 2.7*   CBG: Recent Labs  Lab 06/01/24 0552 06/01/24 0623 06/01/24 0751 06/03/24 1021  GLUCAP 63* 127* 83 87    Discharge time spent: greater than 30 minutes.  Signed: AIDA CHO, MD [facility] 06/07/2024 "

## 2024-06-07 NOTE — Plan of Care (Signed)

## 2024-06-25 ENCOUNTER — Telehealth (INDEPENDENT_AMBULATORY_CARE_PROVIDER_SITE_OTHER): Payer: Self-pay

## 2024-06-25 NOTE — Telephone Encounter (Signed)
 I returned a call to [place] at [facility]  to reschedule the patient's bilateral upper extremity venograms with Dr. Marea. Patient has been scheduled on 07/03/24 with a 9:00 am arrival time to the [facility]. Pre-procedure instructions were discussed and will be faxed to beecher Pebbles per her request.

## 2024-07-03 ENCOUNTER — Ambulatory Visit: Admission: RE | Admit: 2024-07-03 | Admitting: Vascular Surgery

## 2024-07-03 ENCOUNTER — Encounter: Admission: RE | Payer: Self-pay

## 2024-07-03 DIAGNOSIS — N186 End stage renal disease: Secondary | ICD-10-CM
